# Patient Record
Sex: Male | Born: 1950
Health system: Southern US, Community
[De-identification: ages and names within clinical notes are randomized; demographics above are authoritative.]

## PROBLEM LIST (undated history)

## (undated) DIAGNOSIS — D61818 Other pancytopenia: Secondary | ICD-10-CM

## (undated) DIAGNOSIS — D472 Monoclonal gammopathy: Secondary | ICD-10-CM

## (undated) DIAGNOSIS — J189 Pneumonia, unspecified organism: Secondary | ICD-10-CM

## (undated) DIAGNOSIS — J670 Farmer's lung: Secondary | ICD-10-CM

## (undated) DIAGNOSIS — Z86010 Personal history of colonic polyps: Secondary | ICD-10-CM

## (undated) DIAGNOSIS — K648 Other hemorrhoids: Secondary | ICD-10-CM

## (undated) DIAGNOSIS — K219 Gastro-esophageal reflux disease without esophagitis: Secondary | ICD-10-CM

## (undated) DIAGNOSIS — I639 Cerebral infarction, unspecified: Secondary | ICD-10-CM

## (undated) DIAGNOSIS — M4802 Spinal stenosis, cervical region: Secondary | ICD-10-CM

## (undated) DIAGNOSIS — D649 Anemia, unspecified: Secondary | ICD-10-CM

## (undated) DIAGNOSIS — B9681 Helicobacter pylori [H. pylori] as the cause of diseases classified elsewhere: Secondary | ICD-10-CM

## (undated) DIAGNOSIS — I10 Essential (primary) hypertension: Secondary | ICD-10-CM

## (undated) DIAGNOSIS — K760 Fatty (change of) liver, not elsewhere classified: Secondary | ICD-10-CM

## (undated) DIAGNOSIS — K2970 Gastritis, unspecified, without bleeding: Secondary | ICD-10-CM

## (undated) DIAGNOSIS — D509 Iron deficiency anemia, unspecified: Secondary | ICD-10-CM

## (undated) DIAGNOSIS — E785 Hyperlipidemia, unspecified: Secondary | ICD-10-CM

## (undated) DIAGNOSIS — H269 Unspecified cataract: Secondary | ICD-10-CM

## (undated) HISTORY — DX: Iron deficiency anemia, unspecified: D50.9

## (undated) HISTORY — PX: CATARACT EXTRACTION: SUR2

## (undated) HISTORY — DX: Farmer's lung: J67.0

## (undated) HISTORY — DX: Personal history of colonic polyps: Z86.010

## (undated) HISTORY — DX: Essential (primary) hypertension: I10

## (undated) HISTORY — DX: Other hemorrhoids: K64.8

## (undated) HISTORY — DX: Unspecified cataract: H26.9

## (undated) HISTORY — DX: Spinal stenosis, cervical region: M48.02

## (undated) HISTORY — DX: Fatty (change of) liver, not elsewhere classified: K76.0

## (undated) HISTORY — DX: Gastritis, unspecified, without bleeding: K29.70

## (undated) HISTORY — DX: Pneumonia, unspecified organism: J18.9

## (undated) HISTORY — PX: ESOPHAGOGASTRODUODENOSCOPY: SHX1529

## (undated) HISTORY — DX: Anemia, unspecified: D64.9

## (undated) HISTORY — PX: TIBIA FRACTURE SURGERY: SHX806

## (undated) HISTORY — DX: Hyperlipidemia, unspecified: E78.5

## (undated) HISTORY — DX: Other pancytopenia: D61.818

## (undated) HISTORY — PX: COLONOSCOPY: SHX174

## (undated) HISTORY — DX: Monoclonal gammopathy: D47.2

## (undated) HISTORY — DX: Cerebral infarction, unspecified: I63.9

## (undated) HISTORY — DX: Gastro-esophageal reflux disease without esophagitis: K21.9

## (undated) HISTORY — DX: Helicobacter pylori (H. pylori) as the cause of diseases classified elsewhere: B96.81

---

## 1970-04-21 DIAGNOSIS — Z8701 Personal history of pneumonia (recurrent): Secondary | ICD-10-CM | POA: Insufficient documentation

## 1988-04-21 DIAGNOSIS — E7800 Pure hypercholesterolemia, unspecified: Secondary | ICD-10-CM | POA: Insufficient documentation

## 1988-04-21 DIAGNOSIS — E113299 Type 2 diabetes mellitus with mild nonproliferative diabetic retinopathy without macular edema, unspecified eye: Secondary | ICD-10-CM | POA: Insufficient documentation

## 1988-04-21 DIAGNOSIS — E119 Type 2 diabetes mellitus without complications: Secondary | ICD-10-CM | POA: Insufficient documentation

## 1988-04-21 HISTORY — DX: Type 2 diabetes mellitus without complications: E11.9

## 1988-04-21 HISTORY — DX: Pure hypercholesterolemia, unspecified: E78.00

## 1988-05-22 HISTORY — PX: FLEXIBLE SIGMOIDOSCOPY: SHX1649

## 1988-06-09 LAB — HM SIGMOIDOSCOPY: HM Sigmoidoscopy: NORMAL

## 1989-01-19 DIAGNOSIS — K219 Gastro-esophageal reflux disease without esophagitis: Secondary | ICD-10-CM

## 1989-01-19 HISTORY — DX: Gastro-esophageal reflux disease without esophagitis: K21.9

## 1996-10-07 ENCOUNTER — Encounter: Payer: Self-pay | Admitting: Family Medicine

## 1996-10-07 LAB — CONVERTED CEMR LAB: PSA: 0.5 ng/mL

## 1996-10-10 ENCOUNTER — Encounter: Payer: Self-pay | Admitting: Family Medicine

## 1997-10-10 ENCOUNTER — Encounter: Payer: Self-pay | Admitting: Family Medicine

## 1997-10-10 LAB — CONVERTED CEMR LAB: Hgb A1c MFr Bld: 7.9 %

## 1998-02-28 ENCOUNTER — Encounter: Payer: Self-pay | Admitting: Family Medicine

## 1998-08-23 ENCOUNTER — Encounter: Payer: Self-pay | Admitting: Family Medicine

## 1998-08-24 ENCOUNTER — Encounter: Payer: Self-pay | Admitting: Family Medicine

## 1998-08-24 LAB — CONVERTED CEMR LAB: Microalbumin U total vol: 15.3 mg/L

## 1999-11-20 DIAGNOSIS — I10 Essential (primary) hypertension: Secondary | ICD-10-CM

## 1999-11-20 HISTORY — DX: Essential (primary) hypertension: I10

## 1999-11-28 ENCOUNTER — Encounter: Payer: Self-pay | Admitting: Family Medicine

## 1999-11-28 LAB — CONVERTED CEMR LAB: Hgb A1c MFr Bld: 10.7 %

## 2001-10-31 ENCOUNTER — Encounter: Payer: Self-pay | Admitting: Family Medicine

## 2001-10-31 LAB — CONVERTED CEMR LAB: Microalbumin U total vol: 11.7 mg/L

## 2001-11-16 ENCOUNTER — Encounter: Payer: Self-pay | Admitting: Family Medicine

## 2001-11-16 LAB — CONVERTED CEMR LAB: PSA: 0.3 ng/mL

## 2001-11-17 ENCOUNTER — Encounter: Payer: Self-pay | Admitting: Family Medicine

## 2002-12-21 HISTORY — PX: KNEE SURGERY: SHX244

## 2003-06-27 ENCOUNTER — Encounter: Payer: Self-pay | Admitting: Family Medicine

## 2003-07-04 ENCOUNTER — Encounter: Payer: Self-pay | Admitting: Family Medicine

## 2003-07-04 LAB — CONVERTED CEMR LAB: Microalbumin U total vol: 41.8 mg/L

## 2004-09-04 ENCOUNTER — Ambulatory Visit: Payer: Self-pay | Admitting: Family Medicine

## 2004-09-12 ENCOUNTER — Ambulatory Visit: Payer: Self-pay | Admitting: Family Medicine

## 2004-09-17 ENCOUNTER — Ambulatory Visit: Payer: Self-pay | Admitting: Family Medicine

## 2005-01-20 ENCOUNTER — Ambulatory Visit: Payer: Self-pay | Admitting: Family Medicine

## 2005-01-20 LAB — CONVERTED CEMR LAB
Hgb A1c MFr Bld: 8.4 %
Microalbumin U total vol: 20.8 mg/L

## 2005-01-22 ENCOUNTER — Ambulatory Visit: Payer: Self-pay | Admitting: Family Medicine

## 2005-03-21 DIAGNOSIS — E11319 Type 2 diabetes mellitus with unspecified diabetic retinopathy without macular edema: Secondary | ICD-10-CM

## 2005-03-21 HISTORY — DX: Type 2 diabetes mellitus with unspecified diabetic retinopathy without macular edema: E11.319

## 2005-05-29 ENCOUNTER — Ambulatory Visit: Payer: Self-pay | Admitting: Family Medicine

## 2005-05-29 LAB — CONVERTED CEMR LAB
Hgb A1c MFr Bld: 6.2 %
Microalbumin U total vol: 13.8 mg/L

## 2005-06-03 ENCOUNTER — Ambulatory Visit: Payer: Self-pay | Admitting: Family Medicine

## 2006-05-18 ENCOUNTER — Ambulatory Visit: Payer: Self-pay | Admitting: Family Medicine

## 2006-05-18 LAB — CONVERTED CEMR LAB
AST: 20 units/L (ref 0–37)
BUN: 12 mg/dL (ref 6–23)
Chloride: 105 meq/L (ref 96–112)
Cholesterol: 154 mg/dL (ref 0–200)
Creatinine, Ser: 0.8 mg/dL (ref 0.4–1.5)
Creatinine,U: 138.7 mg/dL
GFR calc Af Amer: 128 mL/min
GFR calc non Af Amer: 106 mL/min
HDL: 50.2 mg/dL (ref >39.0)
Microalb Creat Ratio: 17.3 mg/g (ref 0.0–30.0)
Microalb, Ur: 2.4 mg/dL — ABNORMAL HIGH (ref 0.0–1.9)
PSA: 0.42 ng/mL
TSH: 1.98 microintl units/mL (ref 0.35–5.50)
Triglycerides: 206 mg/dL (ref 0–149)

## 2006-05-20 ENCOUNTER — Ambulatory Visit: Payer: Self-pay | Admitting: Family Medicine

## 2006-07-31 ENCOUNTER — Encounter: Payer: Self-pay | Admitting: Family Medicine

## 2006-08-17 ENCOUNTER — Ambulatory Visit: Payer: Self-pay | Admitting: Family Medicine

## 2006-08-19 ENCOUNTER — Ambulatory Visit: Payer: Self-pay | Admitting: Family Medicine

## 2007-08-24 ENCOUNTER — Ambulatory Visit: Payer: Self-pay | Admitting: Family Medicine

## 2007-08-24 LAB — CONVERTED CEMR LAB
ALT: 15 units/L (ref 0–53)
AST: 21 units/L (ref 0–37)
Alkaline Phosphatase: 56 units/L (ref 39–117)
Bilirubin, Direct: 0.1 mg/dL (ref 0.0–0.3)
CO2: 31 meq/L (ref 19–32)
Calcium: 9.3 mg/dL (ref 8.4–10.5)
Glucose, Bld: 154 mg/dL — ABNORMAL HIGH (ref 70–99)
PSA: 0.43 ng/mL (ref 0.10–4.00)
Potassium: 4.4 meq/L (ref 3.5–5.1)
Sodium: 142 meq/L (ref 135–145)
TSH: 2.16 microintl units/mL (ref 0.35–5.50)
Total Protein: 6.4 g/dL (ref 6.0–8.3)
Triglycerides: 206 mg/dL (ref 0–149)

## 2007-09-06 ENCOUNTER — Ambulatory Visit: Payer: Self-pay | Admitting: Family Medicine

## 2008-02-28 ENCOUNTER — Ambulatory Visit: Payer: Self-pay | Admitting: Family Medicine

## 2008-03-02 ENCOUNTER — Ambulatory Visit: Payer: Self-pay | Admitting: Family Medicine

## 2008-03-02 LAB — CONVERTED CEMR LAB
ALT: 18 units/L (ref 0–53)
AST: 19 units/L (ref 0–37)
Albumin: 4 g/dL (ref 3.5–5.2)
Cholesterol: 165 mg/dL (ref 0–200)
HDL: 46.4 mg/dL (ref >39.0)
Hgb A1c MFr Bld: 7.7 % — ABNORMAL HIGH (ref 4.6–6.0)
Triglycerides: 180 mg/dL — ABNORMAL HIGH (ref 0–149)

## 2008-08-22 ENCOUNTER — Ambulatory Visit: Payer: Self-pay | Admitting: Family Medicine

## 2008-08-22 LAB — CONVERTED CEMR LAB
Alkaline Phosphatase: 74 units/L (ref 39–117)
BUN: 16 mg/dL (ref 6–23)
Basophils Relative: 0.1 % (ref 0.0–3.0)
Bilirubin, Direct: 0.1 mg/dL (ref 0.0–0.3)
CO2: 30 meq/L (ref 19–32)
Calcium: 9.3 mg/dL (ref 8.4–10.5)
Creatinine, Ser: 0.9 mg/dL (ref 0.4–1.5)
Creatinine,U: 108.8 mg/dL
Eosinophils Absolute: 0.1 10*3/uL (ref 0.0–0.7)
Hgb A1c MFr Bld: 7.5 % — ABNORMAL HIGH (ref 4.6–6.5)
Lymphocytes Relative: 29.2 % (ref 12.0–46.0)
MCHC: 35.7 g/dL (ref 30.0–36.0)
Microalb Creat Ratio: 48.7 mg/g — ABNORMAL HIGH (ref 0.0–30.0)
Microalb, Ur: 5.3 mg/dL — ABNORMAL HIGH (ref 0.0–1.9)
Neutrophils Relative %: 58.7 % (ref 43.0–77.0)
PSA: 0.55 ng/mL (ref 0.10–4.00)
Platelets: 120 10*3/uL — ABNORMAL LOW (ref 150.0–400.0)
RBC: 4.28 M/uL (ref 4.22–5.81)
Total Bilirubin: 1.2 mg/dL (ref 0.3–1.2)
Total Protein: 6.8 g/dL (ref 6.0–8.3)
WBC: 4.7 10*3/uL (ref 4.5–10.5)

## 2009-02-19 ENCOUNTER — Ambulatory Visit: Payer: Self-pay | Admitting: Family Medicine

## 2009-02-19 LAB — CONVERTED CEMR LAB: Hgb A1c MFr Bld: 6.5 % (ref 4.6–6.5)

## 2009-02-21 ENCOUNTER — Ambulatory Visit: Payer: Self-pay | Admitting: Family Medicine

## 2009-03-12 ENCOUNTER — Telehealth: Payer: Self-pay | Admitting: Family Medicine

## 2009-03-13 ENCOUNTER — Telehealth: Payer: Self-pay | Admitting: Family Medicine

## 2009-10-16 ENCOUNTER — Ambulatory Visit: Payer: Self-pay | Admitting: Family Medicine

## 2009-10-16 LAB — HM DIABETES FOOT EXAM

## 2009-10-17 LAB — CONVERTED CEMR LAB
ALT: 17 units/L (ref 0–53)
AST: 18 units/L (ref 0–37)
Albumin: 4.2 g/dL (ref 3.5–5.2)
Alkaline Phosphatase: 75 units/L (ref 39–117)
Bilirubin, Direct: 0.1 mg/dL (ref 0.0–0.3)
CO2: 31 meq/L (ref 19–32)
Chloride: 105 meq/L (ref 96–112)
Glucose, Bld: 149 mg/dL — ABNORMAL HIGH (ref 70–99)
HDL: 62.4 mg/dL (ref >39.00)
Microalb, Ur: 8.6 mg/dL — ABNORMAL HIGH (ref 0.0–1.9)
PSA: 0.54 ng/mL (ref 0.10–4.00)
Potassium: 4.6 meq/L (ref 3.5–5.1)
Sodium: 141 meq/L (ref 135–145)
Total CHOL/HDL Ratio: 3
Total Protein: 6.8 g/dL (ref 6.0–8.3)

## 2009-11-06 ENCOUNTER — Encounter: Payer: Self-pay | Admitting: Family Medicine

## 2009-11-06 LAB — HM DIABETES EYE EXAM

## 2009-11-27 ENCOUNTER — Encounter (INDEPENDENT_AMBULATORY_CARE_PROVIDER_SITE_OTHER): Payer: Self-pay | Admitting: *Deleted

## 2009-12-19 ENCOUNTER — Encounter: Payer: Self-pay | Admitting: Family Medicine

## 2010-01-08 ENCOUNTER — Ambulatory Visit: Payer: Self-pay | Admitting: Family Medicine

## 2010-01-08 DIAGNOSIS — M79609 Pain in unspecified limb: Secondary | ICD-10-CM | POA: Insufficient documentation

## 2010-02-26 ENCOUNTER — Telehealth: Payer: Self-pay | Admitting: Family Medicine

## 2010-05-22 NOTE — Letter (Signed)
Summary: Nadara Eaton letter  [facility] at [facility]  [address] [place], [place] [address]   Phone: [phone]  Fax: [phone]       11/27/2009 MRN: [id number]  Harbor Rybarczyk [address] [place], [place]  [address]  Dear Mr. Rolm Baptise,  [facility] [place], and [facility] announce the retirement of Arta Silence, M.D., from full-time practice at the [facility] office effective October 18, 2009 and his plans of returning part-time.  It is important to Dr. Hetty Ely and to our practice that you understand that [facility] has seven physicians in our office for your health care needs.  We will continue to offer the same exceptional care that you have today.    Dr. Hetty Ely has spoken to many of you about his plans for retirement and returning part-time in the fall.   We will continue to work with you through the transition to schedule appointments for you in the office and meet the high standards that [facility] is committed to.   Again, it is with great pleasure that we share the news that Dr. Hetty Ely will return to [facility] at [facility] in October of 2011 with a reduced schedule.    If you have any questions, or would like to request an appointment with one of our physicians, please call us at [phone] and press the option for Scheduling an appointment.  We take pleasure in providing you with excellent patient care and look forward to seeing you at your next office visit.  Our [facility] Physicians are:  Tillman Abide, M.D. Laurita Quint, M.D. Roxy Manns, M.D. Kerby Nora, M.D. Hannah Beat, M.D. Ruthe Mannan, M.D. We proudly welcomed Raechel Ache, M.D. and Eustaquio Boyden, M.D. to the practice in July/August 2011.  Sincerely,  [place] [facility] [facility]

## 2010-05-22 NOTE — Miscellaneous (Signed)
  Clinical Lists Changes  Observations: Added new observation of DIAB EYE EX: Dr. Eustaquio Maize- L eye with mild diabetic macular changes.  R 20/25 and L 20/50 (11/06/2009 20:53)      Diabetic Eye Exam  Procedure date:  11/06/2009  Findings:      Dr. Eustaquio Maize- L eye with mild diabetic macular changes.  R 20/25 and L 20/50

## 2010-05-22 NOTE — Assessment & Plan Note (Signed)
Summary: CPX/RBH   Vital Signs:  Patient profile:   60 year old male Height:      74 inches Weight:      272.75 pounds BMI:     35.15 Temp:     98.3 degrees F oral Pulse rate:   76 / minute Pulse rhythm:   regular BP sitting:   138 / 70  (left arm) Cuff size:   large  Vitals Entered By: Sydell Axon LPN (October 16, 2009 3:01 PM) CC: 30 Minute checkup, hemoccult cards given to patient   History of Present Illness: Pt here for Comp Exam. He feels well except for old age probs. He currently has a cold. He works with the [employer] on the [place] [employer]. It began just before last week and the [place] felt they were infective, all wearing masks.  He is trying to be good with diet and exercise. He gains in the winter and has begun losing weight now, in the summer. .  He has some plantar fascuiitis and is seeing the eye doctor next week. He has had injections in the eye for presumably macular degen which have done nothing so those have been stopped.  Preventive Screening-Counseling & Management  Alcohol-Tobacco     Alcohol drinks/day: 0     Alcohol type: wine rarely     Smoking Status: never     Passive Smoke Exposure: no  Caffeine-Diet-Exercise     Caffeine use/day: 4     Does Patient Exercise: yes     Type of exercise: elliptical     Exercise (avg: min/session): 39mins-hr     Times/week: 3  Problems Prior to Update: 1)  Health Maintenance Exam  (ICD-V70.0) 2)  Special Screening Malignant Neoplasm of Prostate  (ICD-V76.44) 3)  Hx, Pneumonia  (ICD-V12.61) 4)  Gilbert's Syndrome, 6/99  (ICD-277.4) 5)  Diabetic Retinopathy 12/06  (ICD-250.50) 6)  Hypercholesterolemia (353,TRIG1980) 1990  (ICD-272.0) 7)  Diabetes Mellitus, Type II  (ICD-250.00) 8)  Hypertension  (ICD-401.9) 9)  Gerd  (ICD-530.81)  Medications Prior to Update: 1)  Glyburide 5 Mg Tabs (Glyburide) .... Take 1 Tablet By Mouth Twice A Day 2)  Lipitor 10 Mg Tabs (Atorvastatin Calcium) .Marland Kitchen.. 1 Tablet At Bedtime By Mouth 3)   Metformin Hcl 850 Mg Tabs (Metformin Hcl) .... Take 1 Tablet By Mouth Three Times A Day 4)  Altace 10 Mg Caps (Ramipril) .Marland Kitchen.. 1 Tablet Daily By Mouth 5)  Actos 45 Mg Tabs (Pioglitazone Hcl) .... One Tab By Mouth Once A Day. 6)  Niaspan 500 Mg Tbcr (Niacin (Antihyperlipidemic)) .... 3 Tablet At Bedtime By Mouth 7)  Aspirin 81 Mg Tbec (Aspirin) .... Take One By Mouth Daily  Allergies: 1)  ! * Phenergan  Past History:  Past Medical History: Last updated: 07/31/2006 GERD, 10/90 Hypertension, 8/01 Diabetes mellitus, type II, 84/90  Family History: Last updated: 10/16/2009 Father dec 76 Emphysema Ca (unknown) Mother dec 78 Ca (unknown) Brother  A 22   Skin CA Brother  A 59    LBP Retired   Sheriff--4 ruptured discs          ( 6/09) Sister A 57 Unknown            Social History: Last updated: 09/06/2007 Occupation: Product manager Sports Married  one 22yoa son (09) Never Smoked Alcohol use-yes Drug use-no Regular exercise-no  Risk Factors: Alcohol Use: 0 (10/16/2009) Caffeine Use: 4 (10/16/2009) Exercise: yes (10/16/2009)  Risk Factors: Smoking Status: never (10/16/2009) Passive Smoke Exposure: no (10/16/2009)  Past Surgical History: fractured arm as child Flex. sig., 60 cm.- int. hemorrhoid 2/90 L knee - fx. repair ORIF 9/04  Family History: Father dec 76 Emphysema Ca (unknown) Mother dec 78 Ca (unknown) Brother  A 63   Skin CA Brother  A 59    LBP Retired   Sheriff--4 ruptured discs          ( 6/09) Sister A 57 Unknown            Review of Systems General:  Denies chills, fatigue, fever, sweats, weakness, and weight loss. Eyes:  Complains of blurring; denies discharge and eye pain; macular degen. ENT:  Complains of decreased hearing and ringing in ears; denies earache; Hearing decreased in right, tinnitus in left.. CV:  Denies chest pain or discomfort, fainting, fatigue, palpitations, shortness of breath with exertion, swelling of feet, and swelling of  hands. Resp:  Denies cough, shortness of breath, and wheezing. GI:  Complains of indigestion; denies abdominal pain, bloody stools, change in bowel habits, constipation, dark tarry stools, diarrhea, loss of appetite, nausea, vomiting, vomiting blood, and yellowish skin color; rarely.. GU:  Denies dysuria, incontinence, nocturia, and urinary frequency. MS:  Complains of joint pain and low back pain; denies muscle aches, cramps, and stiffness; left knee and left hip lower back chronically from exertion.. Derm:  Denies dryness, itching, and rash. Neuro:  Denies numbness, poor balance, tingling, and tremors.  Physical Exam  General:  Well-developed,well-nourished,in no acute distress; alert,appropriate and cooperative throughout examination, another 7  pounds lighter. Head:  Normocephalic and atraumatic without obvious abnormalities. No apparent alopecia or balding. Sinuses NT. Eyes:  Conjunctiva clear bilaterally.  Ears:  External ear exam shows no significant lesions or deformities.  Otoscopic examination reveals clear canals, tympanic membranes are intact bilaterally without bulging, retraction, inflammation or discharge. Hearing is grossly normal bilaterally. Nose:  External nasal examination shows no deformity or inflammation. Nasal mucosa are pink and moist without lesions or exudates. Mouth:  Oral mucosa and oropharynx without lesions or exudates.  Teeth in good repair. Neck:  No deformities, masses, or tenderness noted. Chest Wall:  No deformities, masses, tenderness or gynecomastia noted. Breasts:  No masses or gynecomastia noted Lungs:  Normal respiratory effort, chest expands symmetrically. Lungs are clear to auscultation, no crackles or wheezes. Heart:  Normal rate and regular rhythm. S1 and S2 normal without gallop, murmur, click, rub or other extra sounds. Abdomen:  Bowel sounds positive,abdomen soft and non-tender without masses, organomegaly or hernias noted. Rectal:  No external  abnormalities noted. Normal sphincter tone. No rectal masses or tenderness. G neg. Min ext hemms. Genitalia:  Testes bilaterally descended without nodularity, tenderness or masses. No scrotal masses or lesions. No penis lesions or urethral discharge. Prostate:  Prostate gland firm and smooth, no enlargement, nodularity, tenderness, mass, asymmetry or induration. 10-20gms. Msk:  No deformity or scoliosis noted of thoracic or lumbar spine.   Pulses:  R and L carotid,radial,femoral,dorsalis pedis and posterior tibial pulses are full and equal bilaterally Extremities:  No clubbing, cyanosis, edema, or deformity noted with normal full range of motion of all joints.  Mild echymosis of lower legs bilat. Superficial 2cm skin avulsion scar  left lateral lower extremity. Neurologic:  No cranial nerve deficits noted. Station and gait are normal. Plantar reflexes are down-going bilaterally. DTRs are symmetrical throughout. Sensory, motor and coordinative functions appear intact. Skin:  Intact without suspicious lesions or rashes, mild sunburn as usual...again told to use sunscreen. See Extremities for lower leg report. Cervical  Nodes:  No lymphadenopathy noted Inguinal Nodes:  No significant adenopathy Psych:  Cognition and judgment appear intact. Alert and cooperative with normal attention span and concentration. No apparent delusions, illusions, hallucinations  Diabetes Management Exam:    Foot Exam (with socks and/or shoes not present):       Sensory-Pinprick/Light touch:          Left medial foot (L-4): normal          Left dorsal foot (L-5): normal          Left lateral foot (S-1): normal          Right medial foot (L-4): normal          Right dorsal foot (L-5): normal          Right lateral foot (S-1): normal       Sensory-Monofilament:          Left foot: normal          Right foot: normal       Inspection:          Left foot: normal          Right foot: normal       Nails:          Left  foot: normal          Right foot: normal   Impression & Recommendations:  Problem # 1:  HEALTH MAINTENANCE EXAM (ICD-V70.0)  Reviewed preventive care protocols, scheduled due services, and updated immunizations. Tdap today.  Problem # 2:  GILBERT'S SYNDROME,  6/99 (ICD-277.4) Assessment: Unchanged Will recheck. Labs pending.  Problem # 3:  HYPERCHOLESTEROLEMIA  (353,TRIG1980) 1990 (ICD-272.0) Labs pending. Will report via phone tree. His updated medication list for this problem includes:    Lipitor 10 Mg Tabs (Atorvastatin calcium) .Marland Kitchen... 1 tablet at bedtime by mouth    Niaspan 500 Mg Tbcr (Niacin (antihyperlipidemic)) .Marland KitchenMarland KitchenMarland KitchenMarland Kitchen 3 tablet at bedtime by mouth  Labs Reviewed: SGOT: 19 (08/22/2008)   SGPT: 13 (08/22/2008)   HDL:46.4 (03/02/2008), 45.9 (08/24/2007)  LDL:83 (03/02/2008), DEL (16/01/9603)  Chol:165 (03/02/2008), 165 (08/24/2007)  Trig:180 (03/02/2008), 206 (08/24/2007)  Problem # 4:  DIABETES MELLITUS, TYPE II (ICD-250.00) Assessment: Unchanged Continuing to lose weight. Will again report vis phone tree. His updated medication list for this problem includes:    Glyburide 5 Mg Tabs (Glyburide) .Marland Kitchen... Take 1 tablet by mouth twice a day    Metformin Hcl 850 Mg Tabs (Metformin hcl) .Marland Kitchen... Take 1 tablet by mouth three times a day    Altace 10 Mg Caps (Ramipril) .Marland Kitchen... 1 tablet daily by mouth    Actos 45 Mg Tabs (Pioglitazone hcl) ..... One tab by mouth once a day.    Aspirin 81 Mg Tbec (Aspirin) .Marland Kitchen... Take one by mouth daily  Labs Reviewed: Creat: 0.9 (08/22/2008)   Microalbumin: 17.3 (05/18/2006)  Last Eye Exam: normal (08/30/2008) Reviewed HgBA1c results: 6.5 (02/19/2009)  7.5 (08/22/2008)  Problem # 5:  HYPERTENSION (ICD-401.9) Assessment: Unchanged Stable. His updated medication list for this problem includes:    Altace 10 Mg Caps (Ramipril) .Marland Kitchen... 1 tablet daily by mouth  BP today: 138/70 Prior BP: 136/68 (02/21/2009)  Labs Reviewed: K+: 4.2 (08/22/2008) Creat: :  0.9 (08/22/2008)   Chol: 165 (03/02/2008)   HDL: 46.4 (03/02/2008)   LDL: 83 (03/02/2008)   TG: 180 (03/02/2008)  Problem # 6:  GERD (ICD-530.81) Assessment: Unchanged  Self limited.  Diagnostics Reviewed:  Discussed lifestyle modifications, diet, antacids/medications, and preventive measures. Handout provided.  Complete Medication List: 1)  Glyburide 5 Mg Tabs (Glyburide) .... Take 1 tablet by mouth twice a day 2)  Lipitor 10 Mg Tabs (Atorvastatin calcium) .Marland Kitchen.. 1 tablet at bedtime by mouth 3)  Metformin Hcl 850 Mg Tabs (Metformin hcl) .... Take 1 tablet by mouth three times a day 4)  Altace 10 Mg Caps (Ramipril) .Marland Kitchen.. 1 tablet daily by mouth 5)  Actos 45 Mg Tabs (Pioglitazone hcl) .... One tab by mouth once a day. 6)  Niaspan 500 Mg Tbcr (Niacin (antihyperlipidemic)) .... 3 tablet at bedtime by mouth 7)  Aspirin 81 Mg Tbec (Aspirin) .... Take one by mouth daily  Patient Instructions: 1)  RTC as needed or six months. 2)  Report labs via phone tree.  Current Allergies (reviewed today): ! * PHENERGAN  Appended Document: CPX/RBH   Tetanus/Td Vaccine    Vaccine Type: Tdap    Site: left deltoid    Mfr: GlaxoSmithKline    Dose: 0.5 ml    Route: IM    Given by: Sydell Axon LPN    Exp. Date: 07/13/2011    Lot #: [id number]    VIS given: 03/09/07 version given October 16, 2009.

## 2010-05-22 NOTE — Progress Notes (Signed)
Summary: rx for mail order   Phone Note Refill Request Message from:  Patient on February 26, 2010 11:48 AM  Refills Requested: Medication #1:  METFORMIN HCL 850 MG TABS Take 1 tablet by mouth three times a day  Medication #2:  LIPITOR 10 MG TABS 1 TABLET AT BEDTIME BY MOUTH  Medication #3:  ALTACE 10 MG CAPS 1 TABLET DAILY BY MOUTH  Medication #4:  NIASPAN 500 MG TBCR 3 TABLET AT BEDTIME BY MOUTH [facility]   Initial call taken by: Melody Comas,  February 26, 2010 11:49 AM    Prescriptions: NIASPAN 500 MG TBCR (NIACIN (ANTIHYPERLIPIDEMIC)) 3 TABLET AT BEDTIME BY MOUTH  #270 x 3   Entered by:   Lowella Petties CMA, AAMA   Authorized by:   Shaune Leeks MD   Signed by:   Lowella Petties CMA, AAMA on 02/26/2010   Method used:   Electronically to        [facility] ([facility]order)       [facility]       [address]       [place], [place]  [address]       Ph: [phone]       Fax: [phone]   RxID:   [id number] ALTACE 10 MG CAPS (RAMIPRIL) 1 TABLET DAILY BY MOUTH  #90 x 3   Entered by:   Lowella Petties CMA, AAMA   Authorized by:   Shaune Leeks MD   Signed by:   Lowella Petties CMA, AAMA on 02/26/2010   Method used:   Electronically to        [facility] (mail-order)       [facility]       [address]       [place], [place]  [address]       Ph: [phone]       Fax: [phone]   RxID:   [id number] METFORMIN HCL 850 MG TABS (METFORMIN HCL) Take 1 tablet by mouth three times a day  #270 x 3   Entered by:   Lowella Petties CMA, AAMA   Authorized by:   Shaune Leeks MD   Signed by:   Lowella Petties CMA, AAMA on 02/26/2010   Method used:   Electronically to        [facility] (mail-order)       [facility]       [address]       [place], [place]  [address]       Ph: [phone]       Fax: [phone]   RxID:    [id number] LIPITOR 10 MG TABS (ATORVASTATIN CALCIUM) 1 TABLET AT BEDTIME BY MOUTH  #90 x 3   Entered by:   Lowella Petties CMA, AAMA   Authorized by:   Shaune Leeks MD   Signed by:   Lowella Petties CMA, AAMA on 02/26/2010   Method used:   Electronically to        [facility] (mail-order)       [facility]       [address]       [place], [place]  [address]       Ph: [phone]       Fax: [phone]   RxID:   [id number]

## 2010-05-22 NOTE — Letter (Signed)
Summary: [facility]   Imported By: Sherian Rein 12/27/2009 10:58:28  _____________________________________________________________________  External Attachment:    Type:   Image     Comment:   External Document

## 2010-05-22 NOTE — Assessment & Plan Note (Signed)
Summary: SWOLLEN FOOT AND TOE/JRR   Vital Signs:  Patient profile:   60 year old male Height:      74 inches Weight:      288.25 pounds BMI:     37.14 Temp:     98.2 degrees F oral Pulse rate:   76 / minute Pulse rhythm:   regular BP sitting:   148 / 72  (left arm) Cuff size:   large  Vitals Entered By: Delilah Shan CMA Duncan Dull) (January 08, 2010 11:23 AM) CC: Swollen foot and toe   History of Present Illness: R 3rd toe swollen and tender. Started months ago. No relief with stretching.  Less active last month.  Staying about the same generally, some decrease in edema from last week.  More pain with walking.  Less pain after walking on it.  H/o pronation with gait.  Case fell on R toes about 2.5 weeks ago, but symptoms predate that.   Allergies: 1)  ! * Phenergan  Social History: Occupation: Education officer, museum Married  one  Never Smoked Alcohol use-yes Drug use-no Regular exercise-no  Review of Systems       See HPI.  Otherwise negative.    Physical Exam  General:  NAD No ankle edema.  bilateral DP pulses 2+ R foot with dropped MT heads and loss of long/transv arch.  R 3rd toe puffy but w/o erthema   Impression & Recommendations:  Problem # 1:  FOOT PAIN, RIGHT (ICD-729.5) Likely combination of loss of arch, dropped MT heads, and plantar fasciitis.  Rec arch supports and MT pad if not improved.  Call back as needed.  Keep stretching.    Complete Medication List: 1)  Glyburide 5 Mg Tabs (Glyburide) .... Take 1 tablet by mouth twice a day 2)  Lipitor 10 Mg Tabs (Atorvastatin calcium) .Marland Kitchen.. 1 tablet at bedtime by mouth 3)  Metformin Hcl 850 Mg Tabs (Metformin hcl) .... Take 1 tablet by mouth three times a day 4)  Altace 10 Mg Caps (Ramipril) .Marland Kitchen.. 1 tablet daily by mouth 5)  Actos 45 Mg Tabs (Pioglitazone hcl) .... One tab by mouth once a day. 6)  Niaspan 500 Mg Tbcr (Niacin (antihyperlipidemic)) .... 3 tablet at bedtime by mouth 7)  Aspirin 81 Mg Tbec (Aspirin)  .... Take one by mouth daily 8)  Multivitamins Tabs (Multiple vitamin) .... Take 1 tablet by mouth once a day  Patient Instructions: 1)  I would get soft inserts that will support your arch.  Use them for both feet and see if that helps.  If you continue to have pain, get a metatarsal pad and put it in your right shoe.  You would put it behind the tender spot (closer to your heel).  That should help after a few days but it may be tender to walk on it initially.  Take care.  Glad to see you. Keep stretching.   Current Allergies (reviewed today): ! * PHENERGAN

## 2010-08-28 ENCOUNTER — Inpatient Hospital Stay: Payer: Self-pay | Admitting: Specialist

## 2010-08-28 DIAGNOSIS — Z01810 Encounter for preprocedural cardiovascular examination: Secondary | ICD-10-CM

## 2010-10-17 ENCOUNTER — Other Ambulatory Visit: Payer: Self-pay | Admitting: Family Medicine

## 2010-10-17 DIAGNOSIS — E119 Type 2 diabetes mellitus without complications: Secondary | ICD-10-CM

## 2010-10-22 ENCOUNTER — Encounter: Payer: Self-pay | Admitting: Family Medicine

## 2010-10-22 ENCOUNTER — Other Ambulatory Visit (INDEPENDENT_AMBULATORY_CARE_PROVIDER_SITE_OTHER): Payer: PRIVATE HEALTH INSURANCE | Admitting: Family Medicine

## 2010-10-22 DIAGNOSIS — E119 Type 2 diabetes mellitus without complications: Secondary | ICD-10-CM

## 2010-10-22 LAB — LIPID PANEL
Cholesterol: 146 mg/dL (ref 0–200)
LDL Cholesterol: 58 mg/dL (ref 0–99)
VLDL: 24 mg/dL (ref 0.0–40.0)

## 2010-10-22 LAB — COMPREHENSIVE METABOLIC PANEL
ALT: 12 U/L (ref 0–53)
AST: 14 U/L (ref 0–37)
Albumin: 4 g/dL (ref 3.5–5.2)
CO2: 31 mEq/L (ref 19–32)
GFR: 93.18 mL/min (ref >60.00)
Glucose, Bld: 137 mg/dL — ABNORMAL HIGH (ref 70–99)
Potassium: 4.8 mEq/L (ref 3.5–5.1)
Sodium: 143 mEq/L (ref 135–145)
Total Bilirubin: 0.4 mg/dL (ref 0.3–1.2)
Total Protein: 6.4 g/dL (ref 6.0–8.3)

## 2010-10-24 ENCOUNTER — Ambulatory Visit (INDEPENDENT_AMBULATORY_CARE_PROVIDER_SITE_OTHER): Payer: PRIVATE HEALTH INSURANCE | Admitting: Family Medicine

## 2010-10-24 ENCOUNTER — Encounter: Payer: Self-pay | Admitting: Family Medicine

## 2010-10-24 VITALS — BP 138/64 | HR 72 | Temp 98.5°F | Ht 74.0 in | Wt 277.8 lb

## 2010-10-24 DIAGNOSIS — E7800 Pure hypercholesterolemia, unspecified: Secondary | ICD-10-CM

## 2010-10-24 DIAGNOSIS — S82209A Unspecified fracture of shaft of unspecified tibia, initial encounter for closed fracture: Secondary | ICD-10-CM

## 2010-10-24 DIAGNOSIS — I10 Essential (primary) hypertension: Secondary | ICD-10-CM

## 2010-10-24 DIAGNOSIS — Z1211 Encounter for screening for malignant neoplasm of colon: Secondary | ICD-10-CM

## 2010-10-24 DIAGNOSIS — M79609 Pain in unspecified limb: Secondary | ICD-10-CM

## 2010-10-24 DIAGNOSIS — D649 Anemia, unspecified: Secondary | ICD-10-CM

## 2010-10-24 DIAGNOSIS — Z125 Encounter for screening for malignant neoplasm of prostate: Secondary | ICD-10-CM

## 2010-10-24 DIAGNOSIS — Z0000 Encounter for general adult medical examination without abnormal findings: Secondary | ICD-10-CM

## 2010-10-24 DIAGNOSIS — E119 Type 2 diabetes mellitus without complications: Secondary | ICD-10-CM

## 2010-10-24 HISTORY — DX: Unspecified fracture of shaft of unspecified tibia, initial encounter for closed fracture: S82.209A

## 2010-10-24 LAB — IBC PANEL
Iron: 60 ug/dL (ref 42–165)
Saturation Ratios: 15.2 % — ABNORMAL LOW (ref 20.0–50.0)
Transferrin: 281.8 mg/dL (ref 212.0–360.0)

## 2010-10-24 LAB — CBC WITH DIFFERENTIAL/PLATELET
Eosinophils Relative: 3.7 % (ref 0.0–5.0)
Lymphocytes Relative: 32.6 % (ref 12.0–46.0)
Monocytes Absolute: 0.4 10*3/uL (ref 0.1–1.0)
Monocytes Relative: 8.7 % (ref 3.0–12.0)
Neutrophils Relative %: 54.5 % (ref 43.0–77.0)
Platelets: 139 10*3/uL — ABNORMAL LOW (ref 150.0–400.0)
WBC: 4.3 10*3/uL — ABNORMAL LOW (ref 4.5–10.5)

## 2010-10-24 NOTE — Assessment & Plan Note (Signed)
PSA options were discussed along with recent recs.  No need for psa at this point, since patient is low risk and there is no FH of prosate CA.  He declined testing of PSA.

## 2010-10-24 NOTE — Assessment & Plan Note (Signed)
Healthy habits encouraged.  Flu shot in fall, pt to check on zostavax.  tdap up to date.  He'll inc exercise in PT.

## 2010-10-24 NOTE — Patient Instructions (Addendum)
I would get a flu shot each fall.   Check with your insurance to see if they will cover the shingles shot. You can get your results through our phone system.  Follow the instructions on the blue card. Please call about follow up with the eye clinic.   I'll notify you about rechecking your labs in 3-6 months.

## 2010-10-24 NOTE — Assessment & Plan Note (Signed)
Controlled, labs d/w pt.  

## 2010-10-24 NOTE — Assessment & Plan Note (Signed)
Labs d/w pt, no change in meds.

## 2010-10-24 NOTE — Assessment & Plan Note (Signed)
Return for f/u A1c in 3 months.  See notes on labs.  No change in meds.

## 2010-10-24 NOTE — Assessment & Plan Note (Signed)
D/w patient re:options for colon cancer screening, including IFOB vs. colonoscopy.  Risks and benefits of both were discussed and patient voiced understanding.  Pt elects for:IFOB  

## 2010-10-24 NOTE — Progress Notes (Signed)
Foot pain per [facility].  Seen for R foot arch changes.    Seeing Dr. Hyacinth Meeker about L tibial fracture s/p repair.  He's working on PT now.  Using a walker and improving gradually.  Diabetes:  Using medications without difficulties:yes Hypoglycemic episodes:rare Hyperglycemic episodes:rare Feet problems:dec in sensation after L tibial rx Blood Sugars averaging: 80-140  H/o anemia- noted on labs at [facility].  Due for repeat today.  This may have influenced the A1c.    ? OSA during admission.  Since then, he is now back to sleeping on his side and not having AM fatigue/HA.  Down 10+ lbs.  We tabled the sleep study referral today.  If he has return of sx, then he'll notify us.   CPE- See plan.  Routine anticipatory guidance given to patient.  See health maintenance.  PMH and SH reviewed  Meds, vitals, and allergies reviewed.   ROS: See HPI.  Otherwise negative.    GEN: nad, alert and oriented HEENT: mucous membranes moist NECK: supple w/o LA CV: rrr. PULM: ctab, no inc wob ABD: soft, +bs EXT: no edema SKIN: no acute rash, L leg incision well healed.   Prostate gland firm and smooth, no enlargement, nodularity, tenderness, mass, asymmetry or induration.  Diabetic foot exam: Normal inspection No skin breakdown No calluses  Normal DP pulses Normal sensation to light touch and monofilament on R foot, dec on L foot.  Nails normal

## 2010-10-24 NOTE — Assessment & Plan Note (Signed)
Per ortho.  

## 2010-10-25 ENCOUNTER — Telehealth: Payer: Self-pay | Admitting: *Deleted

## 2010-10-25 NOTE — Telephone Encounter (Signed)
Pt callback msge from [facility]. Pt informed of Lab results & will call back at later time after checking schedule to schedule lab appt. For 3 mths out.

## 2010-11-04 ENCOUNTER — Other Ambulatory Visit: Payer: PRIVATE HEALTH INSURANCE

## 2010-11-05 ENCOUNTER — Other Ambulatory Visit: Payer: Self-pay | Admitting: Family Medicine

## 2010-11-05 DIAGNOSIS — Z1289 Encounter for screening for malignant neoplasm of other sites: Secondary | ICD-10-CM

## 2010-11-05 LAB — FECAL OCCULT BLOOD, IMMUNOCHEMICAL: Fecal Occult Bld: NEGATIVE

## 2010-11-06 ENCOUNTER — Encounter: Payer: Self-pay | Admitting: *Deleted

## 2010-11-28 ENCOUNTER — Other Ambulatory Visit: Payer: Self-pay | Admitting: *Deleted

## 2010-11-28 MED ORDER — FERROUS SULFATE 325 (65 FE) MG PO TABS: 325.0000 mg | ORAL_TABLET | Freq: Two times a day (BID) | ORAL | Status: DC

## 2010-11-28 NOTE — Telephone Encounter (Signed)
Yes, continue, recheck blood counts with other labs as scheduled.  Please set up lab visit and OV after the labs.  Thanks.

## 2010-11-28 NOTE — Telephone Encounter (Signed)
Pt states the refills on the iron that pt was prescribed by the surgeon have run out and pt's wife is asking if you want him to continue this.  If so, please send to [facility].

## 2010-11-29 NOTE — Telephone Encounter (Signed)
Dennis Wise advised as instructed via telephone.  She will call back to schedule labs and OV in about 3 months like Dr. Para March had discussed with them previously.

## 2011-01-10 ENCOUNTER — Other Ambulatory Visit: Payer: Self-pay | Admitting: Family Medicine

## 2011-02-25 ENCOUNTER — Other Ambulatory Visit (INDEPENDENT_AMBULATORY_CARE_PROVIDER_SITE_OTHER): Payer: BC Managed Care – PPO

## 2011-02-25 DIAGNOSIS — D649 Anemia, unspecified: Secondary | ICD-10-CM

## 2011-02-25 DIAGNOSIS — E119 Type 2 diabetes mellitus without complications: Secondary | ICD-10-CM

## 2011-02-25 LAB — CBC WITH DIFFERENTIAL/PLATELET
Basophils Absolute: 0 10*3/uL (ref 0.0–0.1)
Basophils Relative: 0.4 % (ref 0.0–3.0)
Eosinophils Absolute: 0.1 10*3/uL (ref 0.0–0.7)
HCT: 35.2 % — ABNORMAL LOW (ref 39.0–52.0)
Hemoglobin: 12.2 g/dL — ABNORMAL LOW (ref 13.0–17.0)
Lymphocytes Relative: 35.4 % (ref 12.0–46.0)
Lymphs Abs: 1.5 10*3/uL (ref 0.7–4.0)
MCHC: 34.8 g/dL (ref 30.0–36.0)
Neutro Abs: 2.1 10*3/uL (ref 1.4–7.7)
RBC: 4.1 Mil/uL — ABNORMAL LOW (ref 4.22–5.81)
RDW: 14.1 % (ref 11.5–14.6)

## 2011-02-25 LAB — IBC PANEL: Saturation Ratios: 24.7 % (ref 20.0–50.0)

## 2011-03-03 ENCOUNTER — Ambulatory Visit (INDEPENDENT_AMBULATORY_CARE_PROVIDER_SITE_OTHER): Payer: BC Managed Care – PPO | Admitting: Family Medicine

## 2011-03-03 ENCOUNTER — Encounter: Payer: Self-pay | Admitting: Family Medicine

## 2011-03-03 VITALS — BP 132/60 | HR 70 | Temp 98.1°F | Wt 282.0 lb

## 2011-03-03 DIAGNOSIS — D649 Anemia, unspecified: Secondary | ICD-10-CM

## 2011-03-03 DIAGNOSIS — E119 Type 2 diabetes mellitus without complications: Secondary | ICD-10-CM

## 2011-03-03 DIAGNOSIS — I10 Essential (primary) hypertension: Secondary | ICD-10-CM

## 2011-03-03 DIAGNOSIS — E1142 Type 2 diabetes mellitus with diabetic polyneuropathy: Secondary | ICD-10-CM

## 2011-03-03 DIAGNOSIS — Z23 Encounter for immunization: Secondary | ICD-10-CM

## 2011-03-03 DIAGNOSIS — E1149 Type 2 diabetes mellitus with other diabetic neurological complication: Secondary | ICD-10-CM

## 2011-03-03 NOTE — Progress Notes (Signed)
Anemia.  Noted in the hospital at time of surgery, hgb 8.5.  IFOB was negative here in clinic.  No other sig blood loss- though he does have some bruising on the aspirin.  No blood in stool or urine noted by pt.  Intolerant of iron after 1 month due to GERD.  H/o low platelets and relatively low white count prev.  LFTs prev normal.  No unexplained weight loss, other 'B' sx.   Diabetes:  Using medications without difficulties: yes Hypoglycemic episodes:none known Hyperglycemic episodes:none known Feet problems: no Blood Sugars averaging: not checked consistently His diet has been altered on the road with work.    Hypertension:   Using medication without problems or lightheadedness: yes Chest pain with exertion:no Edema:no Short of breath:no  L tibia fracture is healed and he's doing well with that.    PMH and SH reviewed.   Vital signs, Meds and allergies reviewed.  ROS: See HPI.  Otherwise nontributory.   GEN: nad, alert and oriented HEENT: mucous membranes moist NECK: supple w/o LA CV: rrr.  no murmur PULM: ctab, no inc wob ABD: soft, +bs EXT: no edema SKIN: no acute rash  Diabetic foot exam: Normal inspection No skin breakdown No calluses  Normal DP pulses Dec sensation to light tough and monofilament on plantar side of feet Nails thickened

## 2011-03-03 NOTE — Patient Instructions (Signed)
We'll notify you after I talk with hematology.   Come back for repeat A1c in 3 months with OV a few days later.

## 2011-03-04 ENCOUNTER — Telehealth: Payer: Self-pay | Admitting: Family Medicine

## 2011-03-04 ENCOUNTER — Encounter: Payer: Self-pay | Admitting: Family Medicine

## 2011-03-04 DIAGNOSIS — D509 Iron deficiency anemia, unspecified: Secondary | ICD-10-CM | POA: Insufficient documentation

## 2011-03-04 HISTORY — DX: Iron deficiency anemia, unspecified: D50.9

## 2011-03-04 NOTE — Assessment & Plan Note (Signed)
Controlled , no change in meds 

## 2011-03-04 NOTE — Telephone Encounter (Signed)
Please call pt.  I talked with Dr. Welton Flakes and I would like him to be seen at her clinic ([facility]).  She agrees that he should be seen, to check his blood counts.  I verbally asked for the referral and she'll have her clinic contact him about scheduling the appointment.  Thanks.

## 2011-03-04 NOTE — Telephone Encounter (Signed)
Patient notified as instructed by telephone. Patient stated that he will wait for the call from Dr. Milta Deiters office to schedule the appointment.

## 2011-03-04 NOTE — Assessment & Plan Note (Signed)
With relatively low white count and platelets without obvious cause.  Recent CMET unremarkable.  I've called Dr. Welton Flakes with heme to discuss, she's out of office today and I'll await return call then notify pt.  He agrees.

## 2011-03-04 NOTE — Assessment & Plan Note (Signed)
Inc in A1c likely related to improvement in hgb, prev a1c was likely a false depression.  Continue current meds, work on diet and recheck in 3 months.  He agrees.

## 2011-03-06 ENCOUNTER — Telehealth: Payer: Self-pay | Admitting: *Deleted

## 2011-03-06 NOTE — Telephone Encounter (Signed)
CALLED PATIENT PATIENT CONFIRMED OVER THE PHONE THE NEW DATE AND TIME OF THE APPOINTMENT ON 03-26-2011 STARTING AT 2:00 WITH LABS.

## 2011-03-26 ENCOUNTER — Other Ambulatory Visit: Payer: BC Managed Care – PPO | Admitting: Lab

## 2011-03-26 ENCOUNTER — Ambulatory Visit: Payer: BC Managed Care – PPO

## 2011-03-26 ENCOUNTER — Ambulatory Visit (HOSPITAL_BASED_OUTPATIENT_CLINIC_OR_DEPARTMENT_OTHER): Payer: BC Managed Care – PPO | Admitting: Oncology

## 2011-03-26 ENCOUNTER — Telehealth: Payer: Self-pay | Admitting: *Deleted

## 2011-03-26 VITALS — BP 185/76 | HR 80 | Temp 98.2°F | Wt 282.8 lb

## 2011-03-26 DIAGNOSIS — D649 Anemia, unspecified: Secondary | ICD-10-CM

## 2011-03-26 DIAGNOSIS — D61818 Other pancytopenia: Secondary | ICD-10-CM

## 2011-03-26 DIAGNOSIS — D469 Myelodysplastic syndrome, unspecified: Secondary | ICD-10-CM

## 2011-03-26 NOTE — Progress Notes (Signed)
Dennis Wise [id number] 02/24/1951 60 y.o. 03/26/2011 5:17 PM  CC Dr. [name]  REASON FOR CONSULTATION:  60 year old gentleman with mild pancytopenia and most recently he was found to be profoundly  anemic requiring 2 units of packed red cells and IV iron infusion.   REFERRING PHYSICIAN: Dr. [name]  HISTORY OF PRESENT ILLNESS:  Dennis Wise is a 60 y.o. male.  With multiple medical problems including gastroesophageal reflux disease hypertension long-standing history of diabetes and hyperlipidemia. Patient states that about May 2012 [facility] As part of his preop he had a CBC performed and he was found to be anemic with a hemoglobin of about 8. Because of this he had a blood transfusion and he had an iron infusion. Upon reviewing his blood work from previously patient has had mild iron deficiency for about 4 months time. His last normal hemoglobin was in about 2 years ago at 13.1. However at that time his platelets were low at 120,000. 4 months ago his hemoglobin was 12.2 with a platelet count of about 139,000. His white count also was low at 4.3. About 02/25/2011 he was seen by Dr. Cheree Ditto who noted a CBC again to have a low platelet count 138,000 white count was 4.1 and hemoglobin was on the low side at 12.2. Iron studies were also checked his total iron was 93 and percent saturation was 24.7%. 4 months ago his iron saturation was 15.2. Because of this he was referred to hematology for for further evaluation and management. Patient tells me he has never had a colonoscopy but he has had a sigmoidoscopy many years ago. He is denying having any bleeding hematuria hematochezia or melena hemoptysis hematemesis. He does have some gastroesophageal reflux disease but has not noticed any bleeding. Remainder of the 14 point review of systems is negative.  Past Medical History  Diagnosis Date  . GERD (gastroesophageal reflux disease) 10/90  . Hypertension  08/01  . Diabetes mellitus 84/90(not sure what this means but was listed in centricity like this)    type II  . Hyperlipidemia     Past Surgical History  Procedure Date  . Knee surgery 09/04    lt knee fx repair ORIF  . Tibia fracture surgery     left 2012    Family History  Problem Relation Age of Onset  . Cancer Brother     skin CA  . Colon cancer Neg Hx   . Prostate cancer Neg Hx     Social History History  Substance Use Topics  . Smoking status: Never Smoker   . Smokeless tobacco: Not on file  . Alcohol Use: Yes     extremely rare (a few glasses of wine a year)    Allergies  Allergen Reactions  . Promethazine Hcl     REACTION: AGITIATION    Current Outpatient Prescriptions  Medication Sig Dispense Refill  . ACTOS 45 MG tablet TAKE 1 TABLET BY MOUTH ONCE A DAY  90 tablet  2  . aspirin 81 MG tablet Take 81 mg by mouth daily.        Marland Kitchen atorvastatin (LIPITOR) 10 MG tablet TAKE 1 TABLET BY MOUTH AT BEDTIME  90 tablet  3  . glyBURIDE (DIABETA) 5 MG tablet TAKE 1 TABLET BY MOUTH TWICE DAILY  180 tablet  2  . metFORMIN (GLUCOPHAGE) 850 MG tablet TAKE 1 TABLET BY MOUTH 3 TIMES DAILY  270 tablet  2  . Multiple  Vitamin (MULTIVITAMIN) tablet Take 1 tablet by mouth daily.        Marland Kitchen NIASPAN 500 MG CR tablet TAKE 3 TABLETS BY MOUTH AT BEDTIME  270 tablet  2  . ramipril (ALTACE) 10 MG capsule TAKE 1 CAPSULE BY MOUTH DAILY  90 capsule  2    REVIEW OF SYSTEMS:  Pertinent items are noted in HPI.  PHYSICAL EXAMINATION:  ZOX:WRUEA, healthy, no distress, well nourished and well developed SKIN: skin color, texture, turgor are normal, no rashes or significant lesions HEAD: Normocephalic, No masses, lesions, tenderness or abnormalities EYES: normal, PERRLA, EOMI, Conjunctiva are pink and non-injected, sclera clear EARS: External ears normal OROPHARYNX:no exudate, no erythema and lips, buccal mucosa, and tongue normal  NECK: supple, no adenopathy, no bruits, no JVD, thyroid  normal size, non-tender, without nodularity LYMPH:  no palpable lymphadenopathy BREAST:not examined LUNGS: clear to auscultation , and palpation, clear to auscultation and percussion HEART: regular rate & rhythm, no murmurs and no gallops ABDOMEN:abdomen soft, non-tender, obese, normal bowel sounds and no masses or organomegaly BACK: Back symmetric, no curvature. EXTREMITIES:no joint deformities, effusion, or inflammation, no edema  NEURO: alert & oriented x 3 with fluent speech, no focal motor/sensory deficits, gait normal, reflexes normal and symmetric    ASSESSMENT    60 year old male who is being seen today at the request of Dr. Para March for evaluation of anemia and mild pancytopenia. He has been thrombocytopenic since 2 years. But anemia has become more prominent part of the picture since about for 5 months ago. He has had with Department of blood transfusions and iron infusions. Indicating that he most likely is iron deficient. His last iron studies done 4 months ago indicated that he did have a low percent saturation. He could have iron deficiency secondary to poor absorption poor intake orders loss is. Since patient has not had a colonoscopy I would suspect that he could have iron deficiency anemia secondary to a GI process. We could also need to rule out a pulled bone marrow process such as plasma cell dyscrasia or even myelodysplastic syndrome.    PLAN:    I have recommended today to proceed with checking iron studies B12 folate levels SPEP UPEP appear of also discussed bone marrow biopsy and aspirate with him today he certainly does need a colonoscopy and a referral has been made to to gastroenterology. Or further evaluation and planning of possible colonoscopy and endoscopy.     All questions were answered. The patient knows to call the clinic with any problems, questions or concerns. We can certainly see the patient much sooner if necessary.  Thank you so much for allowing me to  participate in the care of Dennis Wise. I will continue to follow up the patient with you and assist in his care.  I spent 40 minutes counseling the patient face to face. The total time spent in the appointment was 60 minutes. Drue Second, MD Medical/Oncology [facility] [phone] (beeper) [phone] (Office)  03/26/2011, 5:17 PM 03/26/2011, 5:17 PM

## 2011-03-26 NOTE — Patient Instructions (Addendum)

## 2011-03-26 NOTE — Telephone Encounter (Signed)
gave patient appointment for 03-2011 lab only 04-30-2011 at 9:00am to the dr.khan printed out calendar and gave to the patient

## 2011-03-29 ENCOUNTER — Telehealth: Payer: Self-pay | Admitting: Oncology

## 2011-03-29 NOTE — Telephone Encounter (Signed)
per MD called pt lmovm that his appt on 01/09 was moved to 01/02 and to rtn call to confirm appt

## 2011-03-31 ENCOUNTER — Telehealth: Payer: Self-pay | Admitting: Oncology

## 2011-04-02 ENCOUNTER — Encounter: Payer: Self-pay | Admitting: Internal Medicine

## 2011-04-17 ENCOUNTER — Other Ambulatory Visit (HOSPITAL_BASED_OUTPATIENT_CLINIC_OR_DEPARTMENT_OTHER): Payer: BC Managed Care – PPO | Admitting: Lab

## 2011-04-17 ENCOUNTER — Other Ambulatory Visit: Payer: Self-pay | Admitting: *Deleted

## 2011-04-17 DIAGNOSIS — E1139 Type 2 diabetes mellitus with other diabetic ophthalmic complication: Secondary | ICD-10-CM

## 2011-04-17 DIAGNOSIS — D649 Anemia, unspecified: Secondary | ICD-10-CM

## 2011-04-23 ENCOUNTER — Telehealth: Payer: Self-pay | Admitting: Oncology

## 2011-04-23 ENCOUNTER — Ambulatory Visit (HOSPITAL_BASED_OUTPATIENT_CLINIC_OR_DEPARTMENT_OTHER): Payer: BC Managed Care – PPO

## 2011-04-23 ENCOUNTER — Ambulatory Visit (HOSPITAL_BASED_OUTPATIENT_CLINIC_OR_DEPARTMENT_OTHER): Payer: BC Managed Care – PPO | Admitting: Oncology

## 2011-04-23 VITALS — BP 169/66 | HR 69 | Temp 98.2°F | Ht 74.0 in | Wt 289.4 lb

## 2011-04-23 DIAGNOSIS — D649 Anemia, unspecified: Secondary | ICD-10-CM

## 2011-04-23 DIAGNOSIS — C9000 Multiple myeloma not having achieved remission: Secondary | ICD-10-CM

## 2011-04-23 DIAGNOSIS — D8989 Other specified disorders involving the immune mechanism, not elsewhere classified: Secondary | ICD-10-CM

## 2011-04-23 LAB — CBC WITH DIFFERENTIAL/PLATELET
EOS%: 2.4 % (ref 0.0–7.0)
LYMPH%: 21.4 % (ref 14.0–49.0)
MCH: 29.1 pg (ref 27.2–33.4)
MCHC: 34.4 g/dL (ref 32.0–36.0)
MCV: 84.4 fL (ref 79.3–98.0)
MONO%: 6.4 % (ref 0.0–14.0)
Platelets: 122 10*3/uL — ABNORMAL LOW (ref 140–400)
RBC: 4.32 10*6/uL (ref 4.20–5.82)
RDW: 13.7 % (ref 11.0–14.6)

## 2011-04-23 NOTE — Patient Instructions (Signed)
Bone Marrow Aspiration and Bone Biopsy Examination of the bone marrow is a valuable test to diagnose blood disorders. A bone marrow biopsy takes a sample of bone and a small amount of fluid and cells from inside the bone. A bone marrow aspiration removes only the marrow. Bone marrow aspiration and bone biopsies are used to stage different disorders of the blood, such as leukemia. Staging will help your caregiver understand how far the disease has progressed.  The tests are also useful in diagnosing:  Fever of unknown origin (FUO).   Bacterial infections and other widespread fungal infections.   Cancers that have spread (metastasized) to the bone marrow.   Diseases that are characterized by a deficiency of an enzyme (storage diseases). This includes:   Niemann-Pick disease.   Gaucher disease.  PROCEDURE  Sites used to get samples include:   Back of your hip bone (posterior iliac crest).   Both aspiration and biopsy.   Front of your hip bone (anterior iliac crest).   Both aspiration and biopsy.   Breastbone (sternum).   Aspiration from your breastbone (done only in adults). This method is rarely used.  When you get a hip bone aspiration:  You are placed lying on your side with the upper knee brought up and flexed with the lower leg straight.   The site is prepared, cleaned with an antiseptic scrub, and draped. This keeps the biopsy area clean.   The skin and the area down to the lining of the bone (periosteum) are made numb with a local anesthetic.   The bone marrow aspiration needle is inserted. You will feel pressure on your bone.   Once inside the marrow cavity, a sample of bone marrow is sucked out (aspirated) for pathology slides.   The material collected for bone marrow slides is processed immediately by a technologist.   The technician selects the marrow particles to make the slides for pathology.   The marrow aspiration needle is removed. Then pressure is applied to  the site with gauze until bleeding has stopped.  Following an aspiration, a bone marrow biopsy may be performed as well. The technique for this is very similar. A dressing is then applied.  RISKS AND COMPLICATIONS  The main complications of a bone marrow aspiration and biopsy include infection and bleeding.   Complications are uncommon. The procedure may not be performed in patients with bleeding tendencies.   A very rare complication from the procedure is injury to the heart during a breastbone (sternal) marrow aspiration. Only bone marrow aspirations are performed in this area.   Long-lasting pain at the site of the bone marrow aspiration and biopsy is uncommon.  Your caregiver will let you know when you are to get your results and will discuss them with you. You may make an appointment with your caregiver to find out the results. Do not assume everything is normal if you have not heard from your caregiver or the medical facility. It is important for you to follow up on all of your test results. Document Released: 04/10/2004 Document Revised: 12/18/2010 Document Reviewed: 04/04/2008 Exit[employer] Patient Information 2012 [employer]. 

## 2011-04-23 NOTE — Progress Notes (Signed)
OFFICE PROGRESS NOTE  CC  [name], MD, MD [address] [place] [address]  DIAGNOSIS: 61 year old gentleman with anemia and mild pancytopenia with possible plasma cell dyscrasia  PRIOR THERAPY:none  CURRENT THERAPY:patient is currently undergoing evaluation for possible plasma cell dyscrasia he will have a bone marrow biopsy and aspirate performed on 04/25/2011  INTERVAL HISTORY: Dennis Wise 61 y.o. male returns for followup visit today. Overall he is doing well he is without any complaints. On his last visit I have performed iron studies on him his ferritin is 34. We also did a serum protein electrophoresis in light chain. Patient is found to have kappa light chains in the blood. This is concerning for a possible primary plasma cell dyscrasia/multiple myeloma. Today I discussed the results with both the patient and his wife. Clinically patient seems to be doing well he denies any fevers chills night sweats headaches shortness of breath chest pains palpitations he has no musculoskeletal discomfort no aches or pains. Remainder of the 10 point review of systems is negative.  MEDICAL HISTORY: Past Medical History  Diagnosis Date  . GERD (gastroesophageal reflux disease) 10/90  . Hypertension 08/01  . Diabetes mellitus 84/90(not sure what this means but was listed in centricity like this)    type II  . Hyperlipidemia     ALLERGIES:  is allergic to promethazine hcl.  MEDICATIONS:  Current Outpatient Prescriptions  Medication Sig Dispense Refill  . ACTOS 45 MG tablet TAKE 1 TABLET BY MOUTH ONCE A DAY  90 tablet  2  . aspirin 81 MG tablet Take 81 mg by mouth daily.        Marland Kitchen atorvastatin (LIPITOR) 10 MG tablet TAKE 1 TABLET BY MOUTH AT BEDTIME  90 tablet  3  . glyBURIDE (DIABETA) 5 MG tablet TAKE 1 TABLET BY MOUTH TWICE DAILY  180 tablet  2  . metFORMIN (GLUCOPHAGE) 850 MG tablet TAKE 1 TABLET BY MOUTH 3 TIMES DAILY  270 tablet  2  . Multiple Vitamin (MULTIVITAMIN)  tablet Take 1 tablet by mouth daily.        Marland Kitchen NIASPAN 500 MG CR tablet TAKE 3 TABLETS BY MOUTH AT BEDTIME  270 tablet  2  . ramipril (ALTACE) 10 MG capsule TAKE 1 CAPSULE BY MOUTH DAILY  90 capsule  2    SURGICAL HISTORY:  Past Surgical History  Procedure Date  . Knee surgery 09/04    lt knee fx repair ORIF  . Tibia fracture surgery     left 2012    REVIEW OF SYSTEMS:  Pertinent items are noted in HPI.   PHYSICAL EXAMINATION: General appearance: alert, cooperative and appears stated age Resp: clear to auscultation bilaterally and normal percussion bilaterally Cardio: regular rate and rhythm, S1, S2 normal, no murmur, click, rub or gallop GI: soft, non-tender; bowel sounds normal; no masses,  no organomegaly Extremities: extremities normal, atraumatic, no cyanosis or edema Neurologic: Alert and oriented X 3, normal strength and tone. Normal symmetric reflexes. Normal coordination and gait  ECOG PERFORMANCE STATUS: 0 - Asymptomatic  Blood pressure 169/66, pulse 69, temperature 98.2 F (36.8 C), temperature source Oral, height 6\' 2"  (1.88 m), weight 289 lb 6.4 oz (131.271 kg).  LABORATORY DATA: Lab Results  Component Value Date   WBC 4.1* 02/25/2011   HGB 12.2* 02/25/2011   HCT 35.2* 02/25/2011   MCV 85.9 02/25/2011   PLT 138.0* 02/25/2011      Chemistry      Component Value Date/Time  NA 143 10/22/2010 0907   K 4.8 10/22/2010 0907   CL 106 10/22/2010 0907   CO2 31 10/22/2010 0907   BUN 17 10/22/2010 0907   CREATININE 0.9 10/22/2010 0907      Component Value Date/Time   CALCIUM 9.0 10/22/2010 0907   ALKPHOS 98 10/22/2010 0907   AST 14 10/22/2010 0907   ALT 12 10/22/2010 0907   BILITOT 0.4 10/22/2010 0907       RADIOGRAPHIC STUDIES:  No results found.  ASSESSMENT: 61 year old gentleman with  Mild pancytopenia and possible light chain disease/plasma cell dyscrasia. I have discussed the patient's lab results with him today.   PLAN: we will plan on proceeding with a bone marrow biopsy  and aspirate. This will be performed on 04/25/2011 at 8 in the morning in the cancer center infusion room.   All questions were answered. The patient knows to call the clinic with any problems, questions or concerns. We can certainly see the patient much sooner if necessary.  I spent 25 minutes counseling the patient face to face. The total time spent in the appointment was 30 minutes.    Drue Second, MD Medical/Oncology [facility] [phone] (beeper) [phone] (Office)  04/23/2011, 10:19 AM

## 2011-04-23 NOTE — Telephone Encounter (Signed)
gv pt appt for jan2013 °

## 2011-04-25 ENCOUNTER — Encounter (HOSPITAL_BASED_OUTPATIENT_CLINIC_OR_DEPARTMENT_OTHER): Payer: BC Managed Care – PPO | Admitting: Oncology

## 2011-04-25 ENCOUNTER — Telehealth: Payer: Self-pay | Admitting: Oncology

## 2011-04-25 ENCOUNTER — Other Ambulatory Visit: Payer: Self-pay | Admitting: Oncology

## 2011-04-25 ENCOUNTER — Encounter: Payer: BC Managed Care – PPO | Admitting: Oncology

## 2011-04-25 ENCOUNTER — Other Ambulatory Visit (HOSPITAL_COMMUNITY)
Admission: RE | Admit: 2011-04-25 | Discharge: 2011-04-25 | Disposition: A | Discharge status: Home / Self Care | Payer: BC Managed Care – PPO | Source: Ambulatory Visit | Attending: Oncology | Admitting: Oncology

## 2011-04-25 ENCOUNTER — Telehealth: Payer: Self-pay | Admitting: *Deleted

## 2011-04-25 DIAGNOSIS — D61818 Other pancytopenia: Secondary | ICD-10-CM

## 2011-04-25 DIAGNOSIS — E8809 Other disorders of plasma-protein metabolism, not elsewhere classified: Secondary | ICD-10-CM | POA: Insufficient documentation

## 2011-04-25 NOTE — Progress Notes (Signed)
Bone Marrow Biopsy and Aspiration Procedure Note   Informed consent was obtained and potential risks including bleeding, infection and pain were reviewed with the patient.   Posterior iliac crest(s) prepped with Betadine.   Lidocaine 2% local anesthesia infiltrated into the subcutaneous tissue.  Left bone marrow biopsy and left bone marrow aspirate was obtained.   The procedure was tolerated well and there were no complications.  Specimens sent for: routine histopathologic stains and sectioning, flow cytometry and cytogenetics   Dennis Second, MD Medical/Oncology [facility], [place] [phone] (beeper) [phone] (Office)  04/25/2011, 8:52 AM   This encounter was created in error - please disregard.

## 2011-04-25 NOTE — Telephone Encounter (Signed)
Gv pt aptp for jan2013

## 2011-04-25 NOTE — Telephone Encounter (Signed)
Notified pt Vitamin B12 normal

## 2011-04-25 NOTE — Telephone Encounter (Signed)
Message copied by GARNER, Gerald Leitz on Fri Apr 25, 2011 12:25 PM ------      Message from: Victorino December      Created: Mon Apr 21, 2011  1:37 PM       Call patient: B12 level normal

## 2011-04-25 NOTE — Progress Notes (Signed)
Dresing CDI, vitals stable, pt has no complaints, instructed pt to keep dressing on for 24 hours, SLJ This encounter was created in error - please disregard.

## 2011-04-26 LAB — PROTEIN, URINE, 24 HOUR
Collection Interval-UPROT: 24 hours
Protein, 24H Urine: 578 mg/d — ABNORMAL HIGH (ref 50–100)
Protein, ur: 17 mg/dL

## 2011-04-29 NOTE — Progress Notes (Signed)
Error previous note: 04/29/11: forwarded Bone Marrow Report to Dr. Welton Flakes.

## 2011-04-29 NOTE — Progress Notes (Signed)
Received Bone Marrow Report; forwarded to Dr. Gaylyn Rong.

## 2011-04-30 ENCOUNTER — Ambulatory Visit: Payer: BC Managed Care – PPO | Admitting: Oncology

## 2011-04-30 LAB — IRON AND TIBC
Iron: 133 ug/dL (ref 42–165)
TIBC: 380 ug/dL (ref 215–435)
UIBC: 247 ug/dL (ref 125–400)

## 2011-04-30 LAB — SPEP & IFE WITH QIG
Albumin ELP: 60.5 % (ref 55.8–66.1)
Alpha-2-Globulin: 10.2 % (ref 7.1–11.8)
Beta 2: 5.5 % (ref 3.2–6.5)
Beta Globulin: 6.9 % (ref 4.7–7.2)
IgA: 205 mg/dL (ref 68–379)
IgG (Immunoglobin G), Serum: 935 mg/dL (ref 650–1600)

## 2011-04-30 LAB — KAPPA/LAMBDA LIGHT CHAINS: Lambda Free Lght Chn: 1.62 mg/dL (ref 0.57–2.63)

## 2011-04-30 LAB — FOLATE: Folate: >20 ng/mL

## 2011-05-01 ENCOUNTER — Encounter: Payer: Self-pay | Admitting: Internal Medicine

## 2011-05-01 ENCOUNTER — Ambulatory Visit (INDEPENDENT_AMBULATORY_CARE_PROVIDER_SITE_OTHER): Payer: BC Managed Care – PPO | Admitting: Internal Medicine

## 2011-05-01 DIAGNOSIS — R609 Edema, unspecified: Secondary | ICD-10-CM

## 2011-05-01 DIAGNOSIS — R600 Localized edema: Secondary | ICD-10-CM

## 2011-05-01 DIAGNOSIS — D509 Iron deficiency anemia, unspecified: Secondary | ICD-10-CM

## 2011-05-01 DIAGNOSIS — L97909 Non-pressure chronic ulcer of unspecified part of unspecified lower leg with unspecified severity: Secondary | ICD-10-CM

## 2011-05-01 DIAGNOSIS — D61818 Other pancytopenia: Secondary | ICD-10-CM

## 2011-05-01 HISTORY — DX: Other pancytopenia: D61.818

## 2011-05-01 MED ORDER — PEG-KCL-NACL-NASULF-NA ASC-C 100 G PO SOLR: 1.0000 | Freq: Once | ORAL | Status: DC

## 2011-05-01 NOTE — Assessment & Plan Note (Addendum)
The cause is not clear. It is likely multifactorial, he lost some blood earlier in the year with his leg fracture and surgery. That may have depleted his iron. He seems to eat foods containing iron and malabsorption seems pretty unlikely. He has had negative Hemoccults, but has never had a colonoscopy or upper endoscopy. I recommended both of these were further evaluation to look for causes of occult GI blood loss in the upper and lower GI tract. He understands the risks benefits and indications as well as alternatives agrees to proceed.

## 2011-05-01 NOTE — Progress Notes (Signed)
Subjective:    Patient ID: Dennis Wise, male    DOB: 10-09-1950, 61 y.o.   MRN: [id number]  HPI This is a pleasant man sent for evaluation of iron deficiency anemia. He was noted to be anemic after a left tibial plateau fracture, prior surgery earlier this year. Hemoglobin in the 8 range. Subsequently his iron was low, he had negative Hemoccults. He tried oral iron but could not tolerate that. He also had a pancytopenia picture, and a hematology workup is underway. Recently had a bone marrow biopsy because of possible plasma cell dyscrasia, that does not seem to be the case though he has some positive light chains but S. Pat did not show anything monoclonal. He is due to follow up with Dr. Welton Flakes about the bone marrow results next week, we did discuss those in clinic today to the best of my ability but I defer to his hematologist for final information His GI review of systems is negative. He had a flexible sigmoidoscopy 4 years ago by a physician named Dr. Marney Setting, and says it showed hemorrhoids.  Allergies  Allergen Reactions  . Promethazine Hcl     REACTION: AGITIATION   Outpatient Prescriptions Prior to Visit  Medication Sig Dispense Refill  . ACTOS 45 MG tablet TAKE 1 TABLET BY MOUTH ONCE A DAY  90 tablet  2  . aspirin 81 MG tablet Take 81 mg by mouth daily.        Marland Kitchen atorvastatin (LIPITOR) 10 MG tablet TAKE 1 TABLET BY MOUTH AT BEDTIME  90 tablet  3  . glyBURIDE (DIABETA) 5 MG tablet TAKE 1 TABLET BY MOUTH TWICE DAILY  180 tablet  2  . metFORMIN (GLUCOPHAGE) 850 MG tablet TAKE 1 TABLET BY MOUTH 3 TIMES DAILY  270 tablet  2  . Multiple Vitamin (MULTIVITAMIN) tablet Take 1 tablet by mouth daily.        Marland Kitchen NIASPAN 500 MG CR tablet TAKE 3 TABLETS BY MOUTH AT BEDTIME  270 tablet  2  . ramipril (ALTACE) 10 MG capsule TAKE 1 CAPSULE BY MOUTH DAILY  90 capsule  2   Past Medical History  Diagnosis Date  . GERD (gastroesophageal reflux disease) 10/90  . Hypertension 08/01  . Diabetes mellitus  84/90(not sure what this means but was listed in centricity like this)    type II  . Hyperlipidemia   . Internal hemorrhoids   . Anemia     Iron deficiency part of this  . Pneumonia   . Pancytopenia    Past Surgical History  Procedure Date  . Knee surgery 09/04    lt knee fx repair ORIF  . Tibia fracture surgery     left 2012  . Flexible sigmoidoscopy 05/1988    internal hemorrhoids   History   Social History  . Marital Status: Married    Spouse Name: N/A    Number of Children: N/A  . Years of Education: N/A   Social History Main Topics  . Smoking status: Never Smoker   . Smokeless tobacco: Never Used  . Alcohol Use: Yes     extremely rare (a few glasses of wine a year)  . Drug Use: No  . Sexually Active: None   Other Topics Concern  . None   Social History Narrative   Traveling for sports broadcasting.Married [id number] child   Family History  Problem Relation Age of Onset  . Skin cancer Brother   . Emphysema Father   . Cancer Mother     ?  Marland Kitchen  Cancer Father     ?  . Diabetes Mother   . Diabetes Brother   . Heart failure Mother   . Alzheimer's disease Father       Allergies  Allergen Reactions  . Promethazine Hcl     REACTION: AGITIATION   Outpatient Prescriptions Prior to Visit  Medication Sig Dispense Refill  . ACTOS 45 MG tablet TAKE 1 TABLET BY MOUTH ONCE A DAY  90 tablet  2  . aspirin 81 MG tablet Take 81 mg by mouth daily.        Marland Kitchen atorvastatin (LIPITOR) 10 MG tablet TAKE 1 TABLET BY MOUTH AT BEDTIME  90 tablet  3  . glyBURIDE (DIABETA) 5 MG tablet TAKE 1 TABLET BY MOUTH TWICE DAILY  180 tablet  2  . metFORMIN (GLUCOPHAGE) 850 MG tablet TAKE 1 TABLET BY MOUTH 3 TIMES DAILY  270 tablet  2  . Multiple Vitamin (MULTIVITAMIN) tablet Take 1 tablet by mouth daily.        Marland Kitchen NIASPAN 500 MG CR tablet TAKE 3 TABLETS BY MOUTH AT BEDTIME  270 tablet  2  . ramipril (ALTACE) 10 MG capsule TAKE 1 CAPSULE BY MOUTH DAILY  90 capsule  2   Past Medical History    Diagnosis Date  . GERD (gastroesophageal reflux disease) 10/90  . Hypertension 08/01  . Diabetes mellitus 84/90(not sure what this means but was listed in centricity like this)    type II  . Hyperlipidemia   . Internal hemorrhoids   . Anemia     Iron deficiency part of this  . Pneumonia   . Pancytopenia    Past Surgical History  Procedure Date  . Knee surgery 09/04    lt knee fx repair ORIF  . Tibia fracture surgery     left 2012  . Flexible sigmoidoscopy 05/1988    internal hemorrhoids   History   Social History  . Marital Status: Married    Spouse Name: N/A    Number of Children: N/A  . Years of Education: N/A   Social History Main Topics  . Smoking status: Never Smoker   . Smokeless tobacco: Never Used  . Alcohol Use: Yes     extremely rare (a few glasses of wine a year)  . Drug Use: No  . Sexually Active: None   Other Topics Concern  . 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 Skin cancer Brother   . Emphysema Father   . Cancer Mother     ?  Marland Kitchen Cancer Father     ?  . Diabetes Mother   . Diabetes Brother   . Heart failure Mother   . Alzheimer's disease Father         Review of Systems He's had some edema of the lower extremities, also erythema in the pretibial area and a small ulcer developed in the last week or so on the left lower family. It is not painful there is no warmth or discharge. He says that he has problems if he wears socks higher than the ankle. Is also tried some compression stockings but they apparently cause some difficulties as well. He does not recall any trauma to this area. He wears eyeglasses.  All other systems is negative.    Objective:   Physical Exam General:  Well-developed, well-nourished and in no acute distress he is obese Eyes:  anicteric. ENT:   Mouth and posterior pharynx free of lesions.  Neck:   supple w/o  thyromegaly or mass.  Lungs: Clear to auscultation bilaterally. Heart:  S1S2, no rubs, murmurs, gallops. Abdomen:  Obese, soft, non-tender, no hepatosplenomegaly, hernia, or mass and BS+.  Rectal: Deferred until colonoscopy Lymph:  no cervical or supraclavicular adenopathy. Extremities:   There is bilateral lower extremity edema 1+ into the mid-upper pretibial areas. In the left lower extremity there is   a scar in the pretibial area, from prior surgery. In the lower half of the leg there is 6-8 cm area of erythema    probably 3-4 cm wide, slightly warm to the touch but not really hot or warm, below this just above the ankle area   on the medial aspect is a small circular ulcer with eschar, which looks clean and has no surrounding erythema    and no purulence. The feet are without erythema or ulcer Skin   no rash. Neuro:  A&O x 3.  Psych:  appropriate mood and  Affect.   Data Reviewed: SPEP, without monoclonal protein though he has Kappa free like chains. The bone marrow biopsy looked overall normal at 3% plasma cells. His ferritin was 34, B12 methylmalonic acid homocystine all normal. Platelets and white blood cell count are slightly low. Also reviewed primary care and hematology office notes.         Assessment & Plan:   1. Iron deficiency anemia, unspecified   2. Lower extremity edema   3. Leg ulcer   4. Pancytopenia, acquired    See below for the iron deficiency anemia assessment and plan.  Regarding lower extremity edema and leg ulcer, he has some erythema in the lower extremities mainly on the left, I think that he probably has venous stasis issues. He was advised to elevate his legs and followup with Dr. Para March regarding this. He is a diabetic, he is certainly at increased risk of problems there, it sounds like he said difficulty with compression stockings but there may be other treatments that would help.

## 2011-05-01 NOTE — Patient Instructions (Addendum)
Keep your legs elevated and followup with Dr. Para March about the redness, ulcer and swelling of the legs. Right now does not look infected but that is always a possibility. It seems like he should see him within 2-3 weeks. You have been scheduled for an Endoscopy/Colonoscopy with separate instructions given. Your prep kit has been sent to your pharmacy for you to pick up.

## 2011-05-09 ENCOUNTER — Ambulatory Visit (HOSPITAL_BASED_OUTPATIENT_CLINIC_OR_DEPARTMENT_OTHER): Payer: BC Managed Care – PPO | Admitting: Oncology

## 2011-05-09 ENCOUNTER — Other Ambulatory Visit: Payer: BC Managed Care – PPO | Admitting: Lab

## 2011-05-09 VITALS — BP 185/77 | HR 73 | Temp 98.2°F | Ht 74.0 in | Wt 286.3 lb

## 2011-05-09 DIAGNOSIS — D472 Monoclonal gammopathy: Secondary | ICD-10-CM

## 2011-05-09 DIAGNOSIS — D61818 Other pancytopenia: Secondary | ICD-10-CM

## 2011-05-09 DIAGNOSIS — D649 Anemia, unspecified: Secondary | ICD-10-CM

## 2011-05-09 LAB — CBC WITH DIFFERENTIAL/PLATELET
BASO%: 0.4 % (ref 0.0–2.0)
Eosinophils Absolute: 0.1 10*3/uL (ref 0.0–0.5)
LYMPH%: 34.5 % (ref 14.0–49.0)
MCHC: 34.8 g/dL (ref 32.0–36.0)
MONO#: 0.4 10*3/uL (ref 0.1–0.9)
NEUT#: 2.2 10*3/uL (ref 1.5–6.5)
Platelets: 144 10*3/uL (ref 140–400)
RBC: 4.4 10*6/uL (ref 4.20–5.82)
RDW: 13.5 % (ref 11.0–14.6)
WBC: 4.2 10*3/uL (ref 4.0–10.3)

## 2011-05-12 NOTE — Progress Notes (Signed)
OFFICE PROGRESS NOTE  CC  Dennis Givens, MD, MD [address] [place] [address]  DIAGNOSIS: 61 year old gentleman with anemia and mild pancytopenia with possible plasma cell dyscrasia  PRIOR THERAPY:  #1 patient is status post bone marrow biopsy and aspirate for evaluation of plasma cell dyscrasia and a  CURRENT THERAPY:  INTERVAL HISTORY: Dennis Wise 61 y.o. male returns for followup visit today. Mr. with essentially returns today for followup visit to discuss his bone marrow biopsy and aspirate results. The bone marrow revealed normal cellularity for patient's age. There were 3% plasma cells noted on the bone marrow. There was no over abundance of plasma cells she eats. Patient's diagnosis is not consistent with a plasma cell dyscrasia. There was no evidence of a myelodysplastic syndrome type picture either. His bone marrow was essentially nonspecific. He did have a CBC performed today his white count is normal and his hemoglobin is stable and his platelets are normal today as well. I have discussed the bone marrow biopsy results with the patient. Patient most likely has a monoclonal gammopathy of uncertain significance but not a true full-blown multiple myeloma or plasma cell dyscrasia. MEDICAL HISTORY: Past Medical History  Diagnosis Date  . GERD (gastroesophageal reflux disease) 10/90  . Hypertension 08/01  . Diabetes mellitus 84/90(not sure what this means but was listed in centricity like this)    type II  . Hyperlipidemia   . Internal hemorrhoids   . Anemia     Iron deficiency part of this  . Pneumonia   . Pancytopenia     ALLERGIES:  is allergic to promethazine hcl.  MEDICATIONS:  Current Outpatient Prescriptions  Medication Sig Dispense Refill  . ACTOS 45 MG tablet TAKE 1 TABLET BY MOUTH ONCE A DAY  90 tablet  2  . aspirin 81 MG tablet Take 81 mg by mouth daily.        Marland Kitchen atorvastatin (LIPITOR) 10 MG tablet TAKE 1 TABLET BY MOUTH AT BEDTIME  90 tablet   3  . glyBURIDE (DIABETA) 5 MG tablet TAKE 1 TABLET BY MOUTH TWICE DAILY  180 tablet  2  . metFORMIN (GLUCOPHAGE) 850 MG tablet TAKE 1 TABLET BY MOUTH 3 TIMES DAILY  270 tablet  2  . Multiple Vitamin (MULTIVITAMIN) tablet Take 1 tablet by mouth daily.        Marland Kitchen NIASPAN 500 MG CR tablet TAKE 3 TABLETS BY MOUTH AT BEDTIME  270 tablet  2  . peg 3350 powder (MOVIPREP) 100 G SOLR Take 1 kit (100 g total) by mouth once.  1 kit  0  . ramipril (ALTACE) 10 MG capsule TAKE 1 CAPSULE BY MOUTH DAILY  90 capsule  2    SURGICAL HISTORY:  Past Surgical History  Procedure Date  . Knee surgery 09/04    lt knee fx repair ORIF  . Tibia fracture surgery     left 2012  . Flexible sigmoidoscopy 05/1988    internal hemorrhoids   ECOG PERFORMANCE STATUS: 0 - Asymptomatic  Blood pressure 185/77, pulse 73, temperature 98.2 F (36.8 C), height 6\' 2"  (1.88 m), weight 286 lb 4.8 oz (129.865 kg).  LABORATORY DATA: Lab Results  Component Value Date   WBC 4.2 05/09/2011   HGB 12.9* 05/09/2011   HCT 37.0* 05/09/2011   MCV 83.9 05/09/2011   PLT 144 05/09/2011      Chemistry      Component Value Date/Time   NA 143 10/22/2010 0907   K 4.8  10/22/2010 0907   CL 106 10/22/2010 0907   CO2 31 10/22/2010 0907   BUN 17 10/22/2010 0907   CREATININE 0.9 10/22/2010 0907      Component Value Date/Time   CALCIUM 9.0 10/22/2010 0907   ALKPHOS 98 10/22/2010 0907   AST 14 10/22/2010 0907   ALT 12 10/22/2010 0907   BILITOT 0.4 10/22/2010 0907       RADIOGRAPHIC STUDIES:  No results found.  ASSESSMENT: 61 year old gentleman with  Mild pancytopenia and possible light chain disease/plasma cell dyscrasia. The patient and I and his wife discussed the bone marrow biopsy and aspirate results. I have given him a tentative diagnosis of MGUS. He does not fit the criteria of having a multiple myeloma. I think his anemia most likely is anemia of chronic disease or iron deficiency and that can certainly be be managed with oral iron supplements such as  multivitamins with iron. His counts are recovering worked very quickly and I am pleased.   PLAN: I have recommended continuing to follow the patient every 3 months and we will at that time schedule him multiple myeloma panel.  All questions were answered. The patient knows to call the clinic with any problems, questions or concerns. We can certainly see the patient much sooner if necessary.  I spent 25 minutes counseling the patient face to face. The total time spent in the appointment was 30 minutes.    Drue Second, MD Medical/Oncology [facility] [phone] (beeper) [phone] (Office)  05/12/2011, 9:55 PM

## 2011-05-29 ENCOUNTER — Encounter: Payer: BC Managed Care – PPO | Admitting: Internal Medicine

## 2011-06-05 ENCOUNTER — Encounter: Payer: Self-pay | Admitting: Internal Medicine

## 2011-06-05 ENCOUNTER — Ambulatory Visit (AMBULATORY_SURGERY_CENTER): Payer: BC Managed Care – PPO | Admitting: Internal Medicine

## 2011-06-05 VITALS — BP 190/100 | HR 72 | Temp 98.0°F | Resp 14 | Ht 74.0 in | Wt 288.0 lb

## 2011-06-05 DIAGNOSIS — K2990 Gastroduodenitis, unspecified, without bleeding: Secondary | ICD-10-CM

## 2011-06-05 DIAGNOSIS — D126 Benign neoplasm of colon, unspecified: Secondary | ICD-10-CM

## 2011-06-05 DIAGNOSIS — K2970 Gastritis, unspecified, without bleeding: Secondary | ICD-10-CM

## 2011-06-05 DIAGNOSIS — D509 Iron deficiency anemia, unspecified: Secondary | ICD-10-CM

## 2011-06-05 DIAGNOSIS — K2960 Other gastritis without bleeding: Secondary | ICD-10-CM

## 2011-06-05 MED ORDER — SODIUM CHLORIDE 0.9 % IV SOLN: 500.0000 mL | INTRAVENOUS | Status: DC

## 2011-06-05 MED ORDER — OMEPRAZOLE 20 MG PO CPDR: 20.0000 mg | DELAYED_RELEASE_CAPSULE | Freq: Every day | ORAL | Status: DC

## 2011-06-05 NOTE — Progress Notes (Signed)
No complaints noted in the recovery room. Maw  Patient did not have preoperative order for IV antibiotic SSI prophylaxis. (G8918) Patient did not experience any of the following events: a burn prior to discharge; a fall within the facility; wrong site/side/patient/procedure/implant event; or a hospital transfer or hospital admission upon discharge from the facility. (G8907)  

## 2011-06-05 NOTE — Op Note (Signed)
[facility] [address] [place], [place]  [address]  ENDOSCOPY PROCEDURE REPORT  PATIENT:  Dennis, Wise  MR#:  [id number] BIRTHDATE:  1950-09-23, 60 yrs. old  GENDER:  male  ENDOSCOPIST:  Iva Boop, MD, [facility] Referred by:  [name], M.D.  PROCEDURE DATE:  06/05/2011 PROCEDURE:  EGD with biopsy, 43239 ASA CLASS:  Class II INDICATIONS:  iron deficiency anemia  MEDICATIONS:   These medications were titrated to patient response per physician's verbal order, Fentanyl 100 mcg IV, Versed 10 mg IV TOPICAL ANESTHETIC:  Cetacaine Spray  DESCRIPTION OF PROCEDURE:   After the risks benefits and alternatives of the procedure were thoroughly explained, informed consent was obtained.  The LB GIF-H180 [id number] endoscope was introduced through the mouth and advanced to the second portion of the duodenum, without limitations.  The instrument was slowly withdrawn as the mucosa was fully examined. <<PROCEDUREIMAGES>>  Multiple erosions were found in the antrum. Associated erythema. Multiple biopsies were obtained and sent to pathology.  Moderate gastritis was found in the body of the stomach. Multiple biopsies were obtained and sent to pathology.  Otherwise the examination was normal.    Retroflexed views revealed no abnormalities.    The scope was then withdrawn from the patient and the procedure completed.  COMPLICATIONS:  None  ENDOSCOPIC IMPRESSION: 1) Erosions, multiple in the antrum - could be related to anemia  2) Moderate gastritis in the body of the stomach 3) Otherwise normal examination RECOMMENDATIONS: 1) Await biopsy results 2) Start omeprazole 20 mg daily 3) See colonoscopy report.  Iva Boop, MD, [facility]  CC:  [name], MD and The Patient  n. eSIGNED:   Iva Boop at 06/05/2011 02:28 PM  Lynett Fish, [phone]

## 2011-06-05 NOTE — Patient Instructions (Addendum)
Please see the procedure reports for explanation of findings. I have prescribed omeprazole 20 mg daily to heal the stomach. You can get that at your pharmacy later today or tomorrow is ok. Iva Boop, MD, [facility]   Resume your prior medications today. Please call if any questions or concerns.

## 2011-06-05 NOTE — Op Note (Signed)
[facility] [address] [place], [place]  [address]  COLONOSCOPY PROCEDURE REPORT  PATIENT:  Dennis Wise, Dennis Wise  MR#:  [id number] BIRTHDATE:  Aug 09, 1950, 60 yrs. old  GENDER:  male ENDOSCOPIST:  Iva Boop, MD, [facility] REF. BY:  [name], M.D. PROCEDURE DATE:  06/05/2011 PROCEDURE:  Colonoscopy with biopsy and snare polypectomy ASA CLASS:  Class II INDICATIONS:  Iron deficiency anemia MEDICATIONS:   There was residual sedation effect present from prior procedure., These medications were titrated to patient response per physician's verbal order, Fentanyl 25 mcg, Versed 3 mg IV  DESCRIPTION OF PROCEDURE:   After the risks benefits and alternatives of the procedure were thoroughly explained, informed consent was obtained.  Digital rectal exam was performed and revealed no abnormalities and normal prostate.   The LB 180AL [id number] endoscope was introduced through the anus and advanced to the cecum, which was identified by both the appendix and ileocecal valve, without limitations.  The quality of the prep was excellent, using MoviPrep.  The instrument was then slowly withdrawn as the colon was fully examined. <<PROCEDUREIMAGES>>  FINDINGS:  There were multiple polyps identified and removed. Twelve polyps removed. Sizes 3 mm - 7 mm. Cold snare and cold biopsy removal. All sent to pathology. Four transverse polyps, 2 descending, 5 sigmoid and 1 rectal polyp.  This was otherwise a normal examination of the colon.   Retroflexed views in the rectum revealed no other findings other than those already described. The time to cecum = 2:07 minutes. The scope was then withdrawn in 28:04 minutes from the cecum and the procedure completed. COMPLICATIONS:  None ENDOSCOPIC IMPRESSION: 1) Polyps, 12 removed, largest 7 mm. 2) Otherwise normal examination, excellent prep  REPEAT EXAM:  In for Colonoscopy, pending biopsy results.  Follow-up with Dr. Para March re: anemia (and Dr.  Welton Flakes)  Iva Boop, MD, [facility]  CC:  [name], MD and The Patient  n. eSIGNED:   Iva Boop at 06/05/2011 02:34 PM  Lynett Fish, [phone]

## 2011-06-06 ENCOUNTER — Telehealth: Payer: Self-pay | Admitting: *Deleted

## 2011-06-06 NOTE — Telephone Encounter (Signed)
No answer, number on voicemail as identifier. Message left to call if any questions or concerns.

## 2011-06-11 ENCOUNTER — Encounter: Payer: Self-pay | Admitting: Internal Medicine

## 2011-06-11 ENCOUNTER — Encounter: Payer: Self-pay | Admitting: Family Medicine

## 2011-06-11 DIAGNOSIS — B9681 Helicobacter pylori [H. pylori] as the cause of diseases classified elsewhere: Secondary | ICD-10-CM | POA: Insufficient documentation

## 2011-06-11 DIAGNOSIS — K2970 Gastritis, unspecified, without bleeding: Secondary | ICD-10-CM | POA: Insufficient documentation

## 2011-06-11 HISTORY — DX: Helicobacter pylori (H. pylori) as the cause of diseases classified elsewhere: B96.81

## 2011-06-11 NOTE — Progress Notes (Signed)
Quick Note:  1) H. Pylori gastritis - needs treatment with Pylera. Once he has completed that can stop omeprazole.  2) two weeks (at least) after stopping omeprazole needs stool antigen for H. Pylori 3) colon polyps were all benign - I will send a letter about that.  Repeat colonoscopy 1 year due to numerous polyps ([facility] will place recall) ______

## 2011-06-12 ENCOUNTER — Other Ambulatory Visit: Payer: Self-pay

## 2011-06-12 DIAGNOSIS — A048 Other specified bacterial intestinal infections: Secondary | ICD-10-CM

## 2011-06-12 MED ORDER — BIS SUBCIT-METRONID-TETRACYC 140-125-125 MG PO CAPS: ORAL_CAPSULE | ORAL | Status: DC

## 2011-07-14 ENCOUNTER — Other Ambulatory Visit: Payer: BC Managed Care – PPO | Admitting: Lab

## 2011-07-14 ENCOUNTER — Ambulatory Visit: Payer: BC Managed Care – PPO | Admitting: Oncology

## 2011-07-17 ENCOUNTER — Other Ambulatory Visit: Payer: BC Managed Care – PPO

## 2011-07-17 DIAGNOSIS — A048 Other specified bacterial intestinal infections: Secondary | ICD-10-CM

## 2011-07-24 LAB — HELICOBACTER PYLORI  SPECIAL ANTIGEN

## 2011-07-25 NOTE — Progress Notes (Signed)
Quick Note:  This tells [place] H. Pylori is gone - assuming he is off the omeprazole 2 weeks before the test. Please give him the results and clarify omeprazole use - he does not need to stay on it - if he was the test needs to be repeated 2 weeks after stopping ______

## 2011-08-20 ENCOUNTER — Telehealth: Payer: Self-pay | Admitting: *Deleted

## 2011-08-20 ENCOUNTER — Ambulatory Visit (HOSPITAL_BASED_OUTPATIENT_CLINIC_OR_DEPARTMENT_OTHER): Payer: BC Managed Care – PPO | Admitting: Oncology

## 2011-08-20 ENCOUNTER — Encounter: Payer: Self-pay | Admitting: Oncology

## 2011-08-20 ENCOUNTER — Other Ambulatory Visit: Payer: Self-pay | Admitting: Oncology

## 2011-08-20 ENCOUNTER — Other Ambulatory Visit (HOSPITAL_BASED_OUTPATIENT_CLINIC_OR_DEPARTMENT_OTHER): Payer: BC Managed Care – PPO | Admitting: Lab

## 2011-08-20 VITALS — BP 178/70 | HR 63 | Temp 98.1°F | Ht 74.0 in | Wt 288.5 lb

## 2011-08-20 DIAGNOSIS — D61818 Other pancytopenia: Secondary | ICD-10-CM

## 2011-08-20 DIAGNOSIS — D509 Iron deficiency anemia, unspecified: Secondary | ICD-10-CM

## 2011-08-20 DIAGNOSIS — D472 Monoclonal gammopathy: Secondary | ICD-10-CM

## 2011-08-20 LAB — CBC WITH DIFFERENTIAL/PLATELET
BASO%: 0.5 % (ref 0.0–2.0)
Basophils Absolute: 0 10*3/uL (ref 0.0–0.1)
Eosinophils Absolute: 0.1 10*3/uL (ref 0.0–0.5)
HCT: 37.4 % — ABNORMAL LOW (ref 38.4–49.9)
HGB: 12.6 g/dL — ABNORMAL LOW (ref 13.0–17.1)
MONO#: 0.4 10*3/uL (ref 0.1–0.9)
NEUT#: 2.7 10*3/uL (ref 1.5–6.5)
NEUT%: 62 % (ref 39.0–75.0)
WBC: 4.3 10*3/uL (ref 4.0–10.3)
lymph#: 1.1 10*3/uL (ref 0.9–3.3)

## 2011-08-20 NOTE — Patient Instructions (Signed)
1. You are doing well.  2. We will call you with results of your blood tests  3. I will see you back in 6 months (02/27/12)

## 2011-08-20 NOTE — Progress Notes (Signed)
OFFICE PROGRESS NOTE  CC  [name], MD, MD [address] [place] [address]  DIAGNOSIS: 61 year old gentleman with MGUS  PRIOR THERAPY:  1. When a bone marrow biopsy and aspirate that revealed 3% plasma cells in the bone marrow. With monoclonal gammopathy of uncertain significance.(MGUS)  CURRENT THERAPY: Observation  INTERVAL HISTORY: Dennis Wise 61 y.o. male returns for followup visit today. Patient is seen in followup today overall he is doing well he is without any significant complaints. He continues to work full-time. He denies any headaches double vision blurring of vision fevers chills night sweats shortness of breath chest pains or palpitations he has no peripheral paresthesias no difficulty in walking no tingling or numbness. He has no excessive bleeding he has not had any recent hospitalizations cold fluids or infections. Remainder of the 10 point review of systems is negative. MEDICAL HISTORY: Past Medical History  Diagnosis Date  . GERD (gastroesophageal reflux disease) 10/90  . Hypertension 08/01  . Diabetes mellitus 84/90(not sure what this means but was listed in centricity like this)    type II  . Hyperlipidemia   . Internal hemorrhoids   . Anemia     Iron deficiency part of this  . Pneumonia   . Pancytopenia     ALLERGIES:  is allergic to promethazine hcl.  MEDICATIONS:  Current Outpatient Prescriptions  Medication Sig Dispense Refill  . ACTOS 45 MG tablet TAKE 1 TABLET BY MOUTH ONCE A DAY  90 tablet  2  . aspirin 81 MG tablet Take 81 mg by mouth daily.        Marland Kitchen atorvastatin (LIPITOR) 10 MG tablet TAKE 1 TABLET BY MOUTH AT BEDTIME  90 tablet  3  . glyBURIDE (DIABETA) 5 MG tablet TAKE 1 TABLET BY MOUTH TWICE DAILY  180 tablet  2  . metFORMIN (GLUCOPHAGE) 850 MG tablet TAKE 1 TABLET BY MOUTH 3 TIMES DAILY  270 tablet  2  . Multiple Vitamin (MULTIVITAMIN) tablet Take 1 tablet by mouth daily.        Marland Kitchen NIASPAN 500 MG CR tablet TAKE 3 TABLETS  BY MOUTH AT BEDTIME  270 tablet  2  . ramipril (ALTACE) 10 MG capsule TAKE 1 CAPSULE BY MOUTH DAILY  90 capsule  2  . bismuth-metronidazole-tetracycline (PYLERA) 140-125-125 MG per capsule Take 3 capsules qid until gone  120 capsule  0    SURGICAL HISTORY:  Past Surgical History  Procedure Date  . Knee surgery 09/04    lt knee fx repair ORIF  . Tibia fracture surgery     left 2012  . Flexible sigmoidoscopy 05/1988    internal hemorrhoids   ECOG PERFORMANCE STATUS: 0 - Asymptomatic  Blood pressure 178/70, pulse 63, temperature 98.1 F (36.7 C), temperature source Oral, height 6\' 2"  (1.88 m), weight 288 lb 8 oz (130.863 kg).  LABORATORY DATA: pending   RADIOGRAPHIC STUDIES:  No results found.  ASSESSMENT: 61 year old gentleman with:   #1 mild pancytopenia with him 3% plasmacytosis on his bone marrow biopsy and aspirate, Leading to to the diagnosis of monoclonal, but he of uncertain significance. Time patient remains asymptomatic and is doing quite well and may continue to do well for a very long time.  PLAN:   We will continue to do surveillance every 6 months. He had a CBC and a myeloma panel done today we will followup on that and we will call him with the results. He knows to call with any problems questions or  concerns in the meantime.  All questions were answered. The patient knows to call the clinic with any problems, questions or concerns. We can certainly see the patient much sooner if necessary.  I spent 25 minutes counseling the patient face to face. The total time spent in the appointment was 30 minutes.    Drue Second, MD Medical/Oncology [facility] [phone] (beeper) [phone] (Office)  08/20/2011, 10:23 AM

## 2011-08-20 NOTE — Telephone Encounter (Signed)
made patient appointment for 03-01-2012 starting at 9:00am printed out calendar and gave to the patient

## 2011-08-21 ENCOUNTER — Telehealth: Payer: Self-pay | Admitting: Medical Oncology

## 2011-08-21 NOTE — Telephone Encounter (Signed)
Informed patient that per Dr. Welton Flakes his lab work looks stable.  Patient expressed understanding.

## 2011-08-22 LAB — PROTEIN ELECTROPHORESIS, SERUM, WITH REFLEX
Albumin ELP: 60.7 % (ref 55.8–66.1)
Alpha-1-Globulin: 4.1 % (ref 2.9–4.9)
Alpha-2-Globulin: 10.4 % (ref 7.1–11.8)

## 2011-08-22 LAB — IGG, IGA, IGM
IgA: 197 mg/dL (ref 68–379)
IgM, Serum: 53 mg/dL (ref 41–251)

## 2011-09-04 ENCOUNTER — Other Ambulatory Visit: Payer: BC Managed Care – PPO | Admitting: Lab

## 2011-09-04 ENCOUNTER — Ambulatory Visit: Payer: BC Managed Care – PPO | Admitting: Oncology

## 2011-11-25 ENCOUNTER — Other Ambulatory Visit: Payer: Self-pay | Admitting: Family Medicine

## 2011-11-25 NOTE — Telephone Encounter (Signed)
Received refill request electronically from pharmacy. Last office visit 03/04/11 and was to follow-up with lab work and office visit 3 months later. No up coming appointments scheduled. Is it okay to refill medication?

## 2011-11-26 NOTE — Telephone Encounter (Signed)
rxs sent for 90 days, have pt schedule visit with labs ahead of time.

## 2011-11-27 ENCOUNTER — Other Ambulatory Visit: Payer: Self-pay | Admitting: Family Medicine

## 2011-11-27 NOTE — Telephone Encounter (Signed)
Patient's wife advised.  Call sent to [facility] for scheduling.

## 2012-01-27 ENCOUNTER — Other Ambulatory Visit (INDEPENDENT_AMBULATORY_CARE_PROVIDER_SITE_OTHER): Payer: BC Managed Care – PPO

## 2012-01-27 DIAGNOSIS — E119 Type 2 diabetes mellitus without complications: Secondary | ICD-10-CM

## 2012-02-03 ENCOUNTER — Encounter: Payer: Self-pay | Admitting: Family Medicine

## 2012-02-03 ENCOUNTER — Ambulatory Visit (INDEPENDENT_AMBULATORY_CARE_PROVIDER_SITE_OTHER): Payer: BC Managed Care – PPO | Admitting: Family Medicine

## 2012-02-03 VITALS — BP 134/60 | HR 75 | Temp 98.0°F | Wt 281.0 lb

## 2012-02-03 DIAGNOSIS — Z23 Encounter for immunization: Secondary | ICD-10-CM

## 2012-02-03 DIAGNOSIS — E1139 Type 2 diabetes mellitus with other diabetic ophthalmic complication: Secondary | ICD-10-CM

## 2012-02-03 DIAGNOSIS — E11329 Type 2 diabetes mellitus with mild nonproliferative diabetic retinopathy without macular edema: Secondary | ICD-10-CM

## 2012-02-03 DIAGNOSIS — E113299 Type 2 diabetes mellitus with mild nonproliferative diabetic retinopathy without macular edema, unspecified eye: Secondary | ICD-10-CM

## 2012-02-03 NOTE — Patient Instructions (Addendum)
Work on [employer] and weight in the meantime and recheck labs before a physical in 2/14.  Take care. Glad to see you.  Check with the clinic at [facility] and Dr. Welton Flakes about getting a shingles shot.  See if they are okay with you getting it.

## 2012-02-03 NOTE — Progress Notes (Signed)
Diabetes:  Using medications without difficulties:yes Hypoglycemic episodes: only with prolonged fasting Hyperglycemic episodes:no Feet problems:no Blood Sugars averaging: 118 on recent checked. Not checked daily.   eye exam within last year: has been followed by [facility].  Laser treatment for retinal hemorrhage recently.  Vision is improved now.  Limited exercise recently with work. Travelling for work.   He has trouble with weight this time of year.   A1c increased, discussed.   Seen by heme/onc for MGUS.  Has routine f/u scheduled.   PMH and SH reviewed  Meds, vitals, and allergies reviewed.   ROS: See HPI.  Otherwise negative.    GEN: nad, alert and oriented HEENT: mucous membranes moist NECK: supple w/o LA CV: rrr. PULM: ctab, no inc wob ABD: soft, +bs EXT: no edema SKIN: no acute rash  Diabetic foot exam: Normal inspection No skin breakdown No calluses  Normal DP pulses Normal sensation to light touch and monofilament Nails normal

## 2012-02-03 NOTE — Assessment & Plan Note (Signed)
He'll work on [employer]- he's started to adjust his meals.  Will exercise more.  We'll plan to recheck in 2014 at CPE.  He has f/u with [facility] re: his eyes. He agrees with plan.  No change in meds today.  We talked about prevention of further complications of DM2.  He understood.

## 2012-03-01 ENCOUNTER — Telehealth: Payer: Self-pay | Admitting: *Deleted

## 2012-03-01 ENCOUNTER — Encounter: Payer: Self-pay | Admitting: Oncology

## 2012-03-01 ENCOUNTER — Ambulatory Visit (HOSPITAL_BASED_OUTPATIENT_CLINIC_OR_DEPARTMENT_OTHER): Payer: BC Managed Care – PPO | Admitting: Oncology

## 2012-03-01 ENCOUNTER — Other Ambulatory Visit (HOSPITAL_BASED_OUTPATIENT_CLINIC_OR_DEPARTMENT_OTHER): Payer: BC Managed Care – PPO

## 2012-03-01 VITALS — BP 173/75 | HR 77 | Temp 98.3°F | Resp 20 | Ht 74.0 in | Wt 278.8 lb

## 2012-03-01 DIAGNOSIS — D649 Anemia, unspecified: Secondary | ICD-10-CM

## 2012-03-01 DIAGNOSIS — D472 Monoclonal gammopathy: Secondary | ICD-10-CM

## 2012-03-01 DIAGNOSIS — D509 Iron deficiency anemia, unspecified: Secondary | ICD-10-CM

## 2012-03-01 DIAGNOSIS — D61818 Other pancytopenia: Secondary | ICD-10-CM

## 2012-03-01 LAB — CBC WITH DIFFERENTIAL/PLATELET
BASO%: 0.2 % (ref 0.0–2.0)
Basophils Absolute: 0 10*3/uL (ref 0.0–0.1)
EOS%: 1.6 % (ref 0.0–7.0)
HGB: 12.1 g/dL — ABNORMAL LOW (ref 13.0–17.1)
MCH: 28.7 pg (ref 27.2–33.4)
MCHC: 33.5 g/dL (ref 32.0–36.0)
MCV: 85.7 fL (ref 79.3–98.0)
MONO%: 8.2 % (ref 0.0–14.0)
RDW: 14.5 % (ref 11.0–14.6)

## 2012-03-01 NOTE — Progress Notes (Signed)
OFFICE PROGRESS NOTE  CC  [name], MD [address] [place] [address]  DIAGNOSIS: 61 year old gentleman with MGUS  PRIOR THERAPY:  1. When a bone marrow biopsy and aspirate that revealed 3% plasma cells in the bone marrow. With monoclonal gammopathy of uncertain significance.(MGUS)  CURRENT THERAPY: Observation  INTERVAL HISTORY: Dennis Wise 61 y.o. male returns for followup visit today.overall hematologically patient is doing well. However she was noted to have quite a bit of visual disturbances due to his diabetes. He apparently is getting laser therapy at [facility]. He otherwise denies any fevers chills night sweats aches pains no nausea or vomiting no bruising or bleeding. No peripheral paresthesias. Remainder of the 10 point review of systems is negative.   MEDICAL HISTORY: Past Medical History  Diagnosis Date  . GERD (gastroesophageal reflux disease) 10/90  . Hypertension 08/01  . Diabetes mellitus 84/90(not sure what this means but was listed in centricity like this)    type II  . Hyperlipidemia   . Internal hemorrhoids   . Anemia     Iron deficiency part of this  . Pneumonia   . Pancytopenia   . MGUS (monoclonal gammopathy of unknown significance)     ALLERGIES:  is allergic to promethazine hcl.  MEDICATIONS:  Current Outpatient Prescriptions  Medication Sig Dispense Refill  . aspirin 81 MG tablet Take 81 mg by mouth daily.        Marland Kitchen atorvastatin (LIPITOR) 10 MG tablet TAKE 1 TABLET AT BEDTIME  90 tablet  1  . glyBURIDE (DIABETA) 5 MG tablet TAKE 1 TABLET TWICE DAILY  180 tablet  0  . metFORMIN (GLUCOPHAGE) 850 MG tablet TAKE 1 TABLET BY MOUTH 3 TIMES DAILY  270 tablet  0  . Multiple Vitamin (MULTIVITAMIN) tablet Take 1 tablet by mouth daily.        Marland Kitchen NIASPAN 500 MG CR tablet TAKE 3 TABLETS BY MOUTH AT BEDTIME  270 tablet  0  . pioglitazone (ACTOS) 45 MG tablet TAKE 1 TABLET BY MOUTH ONCE A DAY  90 tablet  0  . ramipril (ALTACE) 10 MG capsule  TAKE 1 CAPSULE DAILY  90 capsule  0  . bismuth-metronidazole-tetracycline (PYLERA) 140-125-125 MG per capsule Take 3 capsules qid until gone  120 capsule  0    SURGICAL HISTORY:  Past Surgical History  Procedure Date  . Knee surgery 09/04    lt knee fx repair ORIF  . Tibia fracture surgery     left 2012  . Flexible sigmoidoscopy 05/1988    internal hemorrhoids   ECOG PERFORMANCE STATUS: 0 - Asymptomatic  Blood pressure 173/75, pulse 77, temperature 98.3 F (36.8 C), temperature source Oral, resp. rate 20, height 6\' 2"  (1.88 m), weight 278 lb 12.8 oz (126.463 kg).  LABORATORY DATA: pending   RADIOGRAPHIC STUDIES:  No results found.  ASSESSMENT: 61 year old gentleman with:    #1 patient with MGUS on observation only.  #2 pancytopenia resolved he is mildly anemic but this may be due to his chronic disease of diabetes.  PLAN:  1 patient will continue to be observed for his MGUS and pancytopenia.  #2 patient will be seen back in 6 months time in followup. All questions were answered. The patient knows to call the clinic with any problems, questions or concerns. We can certainly see the patient much sooner if necessary.  I spent 15 minutes counseling the patient face to face. The total time spent in the appointment was 30 minutes.  Drue Second, MD Medical/Oncology [facility] [phone] (beeper) [phone] (Office)  03/01/2012, 9:49 AM

## 2012-03-01 NOTE — Telephone Encounter (Signed)
Gave patient appointment for 2014 

## 2012-03-01 NOTE — Patient Instructions (Addendum)
Doing well, labs look stable  We will continue to see you every 6 months

## 2012-03-03 LAB — KAPPA/LAMBDA LIGHT CHAINS
Kappa free light chain: 1.77 mg/dL (ref 0.33–1.94)
Lambda Free Lght Chn: 1.75 mg/dL (ref 0.57–2.63)

## 2012-03-03 LAB — SPEP & IFE WITH QIG
Alpha-2-Globulin: 9.6 % (ref 7.1–11.8)
Gamma Globulin: 12.5 % (ref 11.1–18.8)
IgM, Serum: 53 mg/dL (ref 41–251)
Total Protein, Serum Electrophoresis: 7.1 g/dL (ref 6.0–8.3)

## 2012-03-16 ENCOUNTER — Other Ambulatory Visit: Payer: Self-pay | Admitting: Family Medicine

## 2012-04-19 ENCOUNTER — Other Ambulatory Visit: Payer: Self-pay | Admitting: Family Medicine

## 2012-05-14 ENCOUNTER — Other Ambulatory Visit: Payer: Self-pay | Admitting: Family Medicine

## 2012-05-14 DIAGNOSIS — E119 Type 2 diabetes mellitus without complications: Secondary | ICD-10-CM

## 2012-05-24 ENCOUNTER — Other Ambulatory Visit (INDEPENDENT_AMBULATORY_CARE_PROVIDER_SITE_OTHER): Payer: BC Managed Care – PPO

## 2012-05-24 DIAGNOSIS — E119 Type 2 diabetes mellitus without complications: Secondary | ICD-10-CM

## 2012-05-24 LAB — HEMOGLOBIN A1C: Hgb A1c MFr Bld: 5.7 % (ref 4.6–6.5)

## 2012-05-24 LAB — COMPREHENSIVE METABOLIC PANEL
Alkaline Phosphatase: 84 U/L (ref 39–117)
Glucose, Bld: 64 mg/dL — ABNORMAL LOW (ref 70–99)
Sodium: 140 mEq/L (ref 135–145)
Total Bilirubin: 0.9 mg/dL (ref 0.3–1.2)
Total Protein: 6.5 g/dL (ref 6.0–8.3)

## 2012-05-24 LAB — LIPID PANEL
HDL: 75.4 mg/dL (ref >39.00)
Total CHOL/HDL Ratio: 2
VLDL: 14.2 mg/dL (ref 0.0–40.0)

## 2012-05-27 ENCOUNTER — Encounter: Payer: Self-pay | Admitting: Family Medicine

## 2012-05-27 ENCOUNTER — Ambulatory Visit (INDEPENDENT_AMBULATORY_CARE_PROVIDER_SITE_OTHER): Payer: BC Managed Care – PPO | Admitting: Family Medicine

## 2012-05-27 ENCOUNTER — Other Ambulatory Visit: Payer: Self-pay | Admitting: Family Medicine

## 2012-05-27 VITALS — BP 130/58 | HR 65 | Temp 98.5°F | Ht 74.0 in | Wt 261.0 lb

## 2012-05-27 DIAGNOSIS — E11329 Type 2 diabetes mellitus with mild nonproliferative diabetic retinopathy without macular edema: Secondary | ICD-10-CM

## 2012-05-27 DIAGNOSIS — I10 Essential (primary) hypertension: Secondary | ICD-10-CM

## 2012-05-27 DIAGNOSIS — E113299 Type 2 diabetes mellitus with mild nonproliferative diabetic retinopathy without macular edema, unspecified eye: Secondary | ICD-10-CM

## 2012-05-27 DIAGNOSIS — E7800 Pure hypercholesterolemia, unspecified: Secondary | ICD-10-CM

## 2012-05-27 DIAGNOSIS — E119 Type 2 diabetes mellitus without complications: Secondary | ICD-10-CM

## 2012-05-27 DIAGNOSIS — Z0000 Encounter for general adult medical examination without abnormal findings: Secondary | ICD-10-CM

## 2012-05-27 DIAGNOSIS — E1139 Type 2 diabetes mellitus with other diabetic ophthalmic complication: Secondary | ICD-10-CM

## 2012-05-27 DIAGNOSIS — Z125 Encounter for screening for malignant neoplasm of prostate: Secondary | ICD-10-CM

## 2012-05-27 NOTE — Patient Instructions (Addendum)
Check with your insurance to see if they will cover the shingles shot. Recheck labs in 3 months.  We'll contact you with your lab report. We should get together again in the fall of 2014.   Stop the glyburide.  If you continue to have low sugars, then cut the actos in half.

## 2012-05-27 NOTE — Progress Notes (Signed)
CPE- See plan.  Routine anticipatory guidance given to patient.  See health maintenance. Tetanus 2011 PNA 2012 Flu shot done fall 2013 Shingles shot.  He checked with Dr. Welton Flakes and it was reported that he could get it.  He'll check on coverage.  Colonoscopy done 2013 PSA d/w pt.  We elected to check on next lab visit.  D/w pt re pro/cons of checking PSA.  Living will.  Wife would be designated if incapacitated.    MGUS is followed per Dr. Welton Flakes.    Elevated Cholesterol: Using medications without problems: yes Muscle aches: no Diet compliance:yes Exercise:yes  Hypertension:    Using medication without problems or lightheadedness: yes Chest pain with exertion:no Edema:no Short of breath:no  Diabetes:  Using medications without difficulties: yes but will lows since losing weight intentionally Hypoglycemic episodes:yes Hyperglycemic episodes:no Feet problems:no Blood Sugars averaging: mult sugars <70 eye exam within last year: yes, at [facility].   PMH and SH reviewed  Meds, vitals, and allergies reviewed.   ROS: See HPI.  Otherwise negative.    GEN: nad, alert and oriented HEENT: mucous membranes moist NECK: supple w/o LA CV: rrr. PULM: ctab, no inc wob ABD: soft, +bs EXT: no edema SKIN: no acute rash  Diabetic foot exam: Normal inspection No skin breakdown No calluses  Normal DP pulses Normal sensation to light touch and monofilament Nails normal

## 2012-05-28 NOTE — Assessment & Plan Note (Signed)
Routine anticipatory guidance given to patient.  See health maintenance. Tetanus 2011 PNA 2012 Flu shot done fall 2013 Shingles shot.  He checked with Dr. Welton Flakes and it was reported that he could get it.  He'll check on coverage.  Colonoscopy done 2013 PSA d/w pt.  We elected to check on next lab visit.  D/w pt re pro/cons of checking PSA.  Living will.  Wife would be designated if incapacitated.

## 2012-05-28 NOTE — Assessment & Plan Note (Signed)
Controlled, continue current meds. Labs d/w pt.  

## 2012-05-28 NOTE — Assessment & Plan Note (Signed)
Now with lows since losing weight.  Will stop glipizide.  Can cut actos in half if needed.  He agrees.  Recheck A1c in 3 months.

## 2012-06-05 ENCOUNTER — Encounter: Payer: Self-pay | Admitting: Internal Medicine

## 2012-06-05 DIAGNOSIS — Z860100 Personal history of colon polyps, unspecified: Secondary | ICD-10-CM

## 2012-06-05 HISTORY — DX: Personal history of colon polyps, unspecified: Z86.0100

## 2012-06-05 HISTORY — DX: Personal history of colonic polyps: Z86.010

## 2012-06-07 ENCOUNTER — Encounter: Payer: Self-pay | Admitting: Internal Medicine

## 2012-06-16 ENCOUNTER — Encounter: Payer: Self-pay | Admitting: Family Medicine

## 2012-08-26 ENCOUNTER — Other Ambulatory Visit (INDEPENDENT_AMBULATORY_CARE_PROVIDER_SITE_OTHER): Payer: BC Managed Care – PPO

## 2012-08-26 DIAGNOSIS — E119 Type 2 diabetes mellitus without complications: Secondary | ICD-10-CM

## 2012-08-26 DIAGNOSIS — E11329 Type 2 diabetes mellitus with mild nonproliferative diabetic retinopathy without macular edema: Secondary | ICD-10-CM

## 2012-08-26 DIAGNOSIS — E1139 Type 2 diabetes mellitus with other diabetic ophthalmic complication: Secondary | ICD-10-CM

## 2012-08-26 DIAGNOSIS — Z125 Encounter for screening for malignant neoplasm of prostate: Secondary | ICD-10-CM

## 2012-08-26 DIAGNOSIS — E113299 Type 2 diabetes mellitus with mild nonproliferative diabetic retinopathy without macular edema, unspecified eye: Secondary | ICD-10-CM

## 2012-08-26 LAB — HEMOGLOBIN A1C: Hgb A1c MFr Bld: 6.4 % (ref 4.6–6.5)

## 2012-08-27 ENCOUNTER — Encounter: Payer: Self-pay | Admitting: *Deleted

## 2012-08-30 ENCOUNTER — Telehealth: Payer: Self-pay | Admitting: *Deleted

## 2012-08-30 ENCOUNTER — Ambulatory Visit (HOSPITAL_BASED_OUTPATIENT_CLINIC_OR_DEPARTMENT_OTHER): Payer: BC Managed Care – PPO | Admitting: Oncology

## 2012-08-30 ENCOUNTER — Encounter: Payer: Self-pay | Admitting: Oncology

## 2012-08-30 ENCOUNTER — Other Ambulatory Visit (HOSPITAL_BASED_OUTPATIENT_CLINIC_OR_DEPARTMENT_OTHER): Payer: BC Managed Care – PPO | Admitting: Lab

## 2012-08-30 VITALS — BP 161/70 | HR 61 | Temp 97.9°F | Resp 20 | Ht 74.0 in | Wt 256.7 lb

## 2012-08-30 DIAGNOSIS — D509 Iron deficiency anemia, unspecified: Secondary | ICD-10-CM

## 2012-08-30 DIAGNOSIS — D61818 Other pancytopenia: Secondary | ICD-10-CM

## 2012-08-30 DIAGNOSIS — D472 Monoclonal gammopathy: Secondary | ICD-10-CM

## 2012-08-30 DIAGNOSIS — E119 Type 2 diabetes mellitus without complications: Secondary | ICD-10-CM

## 2012-08-30 LAB — COMPREHENSIVE METABOLIC PANEL (CC13)
ALT: 40 U/L (ref 0–55)
AST: 32 U/L (ref 5–34)
Alkaline Phosphatase: 133 U/L (ref 40–150)
Creatinine: 1.1 mg/dL (ref 0.7–1.3)
Sodium: 139 mEq/L (ref 136–145)
Total Bilirubin: 0.98 mg/dL (ref 0.20–1.20)
Total Protein: 6.7 g/dL (ref 6.4–8.3)

## 2012-08-30 LAB — CBC WITH DIFFERENTIAL/PLATELET
BASO%: 0.6 % (ref 0.0–2.0)
EOS%: 3.3 % (ref 0.0–7.0)
LYMPH%: 20.5 % (ref 14.0–49.0)
MCH: 29.6 pg (ref 27.2–33.4)
MCHC: 33.3 g/dL (ref 32.0–36.0)
MONO#: 0.3 10*3/uL (ref 0.1–0.9)
Platelets: 96 10*3/uL — ABNORMAL LOW (ref 140–400)
RBC: 4.01 10*6/uL — ABNORMAL LOW (ref 4.20–5.82)
WBC: 4 10*3/uL (ref 4.0–10.3)
lymph#: 0.8 10*3/uL — ABNORMAL LOW (ref 0.9–3.3)

## 2012-08-30 NOTE — Progress Notes (Signed)
OFFICE PROGRESS NOTE  CC  [name], MD [address] [place] [address]  DIAGNOSIS: 62 year old gentleman with MGUS  PRIOR THERAPY:  1. When a bone marrow biopsy and aspirate that revealed 3% plasma cells in the bone marrow. With monoclonal gammopathy of uncertain significance.(MGUS)  CURRENT THERAPY: Observation  INTERVAL HISTORY: MARVEN VELEY 62 y.o. male returns for followup visit today.overall hematologically patient is doing well. However she was noted to have quite a bit of visual disturbances due to his diabetes. Marland Kitchen He otherwise denies any fevers chills night sweats aches pains no nausea or vomiting no bruising or bleeding. No peripheral paresthesias. Remainder of the 10 point review of systems is negative.   MEDICAL HISTORY: Past Medical History  Diagnosis Date  . GERD (gastroesophageal reflux disease) 10/90  . Hypertension 08/01  . Hyperlipidemia   . Internal hemorrhoids   . Anemia     Iron deficiency part of this  . Pneumonia   . Pancytopenia   . MGUS (monoclonal gammopathy of unknown significance)   . Diabetes mellitus     type II  . Personal history of colonic adenomas 06/05/2012    ALLERGIES:  is allergic to promethazine hcl.  MEDICATIONS:  Current Outpatient Prescriptions  Medication Sig Dispense Refill  . aspirin 81 MG tablet Take 81 mg by mouth daily.        Marland Kitchen atorvastatin (LIPITOR) 10 MG tablet TAKE 1 TABLET AT BEDTIME  90 tablet  3  . glyBURIDE (DIABETA) 2.5 MG tablet Take 2.5 mg by mouth at bedtime.      . metFORMIN (GLUCOPHAGE) 850 MG tablet TAKE 1 TABLET 3 TIMES DAILY  270 tablet  3  . Multiple Vitamin (MULTIVITAMIN) tablet Take 1 tablet by mouth daily.        . niacin (NIASPAN) 500 MG CR tablet TAKE 3 TABLETS BY MOUTH AT BEDTIME  270 tablet  3  . pioglitazone (ACTOS) 45 MG tablet TAKE 1 TABLET DAILY  90 tablet  2  . ramipril (ALTACE) 10 MG capsule TAKE 1 CAPSULE DAILY  90 capsule  3   No current facility-administered medications  for this visit.    SURGICAL HISTORY:  Past Surgical History  Procedure Laterality Date  . Knee surgery  09/04    lt knee fx repair ORIF  . Tibia fracture surgery      left 2012  . Flexible sigmoidoscopy  05/1988    internal hemorrhoids   ECOG PERFORMANCE STATUS: 0 - Asymptomatic  Blood pressure 161/70, pulse 61, temperature 97.9 F (36.6 C), temperature source Oral, resp. rate 20, height 6\' 2"  (1.88 m), weight 256 lb 11.2 oz (116.438 kg).  LABORATORY DATA: Lab Results  Component Value Date   WBC 4.0 08/30/2012   HGB 11.9* 08/30/2012   HCT 35.7* 08/30/2012   MCV 88.9 08/30/2012   PLT 96* 08/30/2012      RADIOGRAPHIC STUDIES:  No results found.  ASSESSMENT: 62 year old gentleman with:    #1 patient with MGUS on observation only.  #2 pancytopenia: platelets are lower than previously  PLAN:  1 patient will continue to be observed for his MGUS and pancytopenia.  #2 patient will be seen back in 4 months time in followup.  All questions were answered. The patient knows to call the clinic with any problems, questions or concerns. We can certainly see the patient much sooner if necessary.  I spent 15 minutes counseling the patient face to face. The total time spent in the appointment was  30 minutes.    Drue Second, MD Medical/Oncology [facility] [phone] (beeper) [phone] (Office)  08/30/2012, 9:48 AM

## 2012-08-30 NOTE — Patient Instructions (Addendum)
Doing well, although your platelets are slightly lower than on previous labs.   We will monitor you closely  I will see you back in 4 months

## 2012-08-30 NOTE — Telephone Encounter (Signed)
appts made and printed...td 

## 2012-09-01 LAB — SPEP & IFE WITH QIG
Alpha-2-Globulin: 10.2 % (ref 7.1–11.8)
Beta 2: 5 % (ref 3.2–6.5)
Gamma Globulin: 13.2 % (ref 11.1–18.8)
IgG (Immunoglobin G), Serum: 919 mg/dL (ref 650–1600)

## 2012-09-01 LAB — KAPPA/LAMBDA LIGHT CHAINS: Kappa free light chain: 1.72 mg/dL (ref 0.33–1.94)

## 2012-09-01 LAB — BETA 2 MICROGLOBULIN, SERUM: Beta-2 Microglobulin: 1.81 mg/L — ABNORMAL HIGH (ref 1.01–1.73)

## 2012-11-24 ENCOUNTER — Other Ambulatory Visit: Payer: Self-pay | Admitting: Family Medicine

## 2012-11-24 DIAGNOSIS — E119 Type 2 diabetes mellitus without complications: Secondary | ICD-10-CM

## 2012-11-25 ENCOUNTER — Other Ambulatory Visit: Payer: BC Managed Care – PPO

## 2012-11-30 ENCOUNTER — Other Ambulatory Visit (INDEPENDENT_AMBULATORY_CARE_PROVIDER_SITE_OTHER): Payer: BC Managed Care – PPO

## 2012-11-30 ENCOUNTER — Ambulatory Visit: Payer: BC Managed Care – PPO | Admitting: Family Medicine

## 2012-11-30 DIAGNOSIS — D509 Iron deficiency anemia, unspecified: Secondary | ICD-10-CM

## 2012-11-30 DIAGNOSIS — E11329 Type 2 diabetes mellitus with mild nonproliferative diabetic retinopathy without macular edema: Secondary | ICD-10-CM

## 2012-11-30 DIAGNOSIS — E7800 Pure hypercholesterolemia, unspecified: Secondary | ICD-10-CM

## 2012-11-30 DIAGNOSIS — E1139 Type 2 diabetes mellitus with other diabetic ophthalmic complication: Secondary | ICD-10-CM

## 2012-11-30 DIAGNOSIS — E119 Type 2 diabetes mellitus without complications: Secondary | ICD-10-CM

## 2012-11-30 DIAGNOSIS — D61818 Other pancytopenia: Secondary | ICD-10-CM

## 2012-11-30 DIAGNOSIS — E113299 Type 2 diabetes mellitus with mild nonproliferative diabetic retinopathy without macular edema, unspecified eye: Secondary | ICD-10-CM

## 2012-11-30 LAB — LIPID PANEL
HDL: 76.8 mg/dL (ref >39.00)
LDL Cholesterol: 54 mg/dL (ref 0–99)
Total CHOL/HDL Ratio: 2
Triglycerides: 83 mg/dL (ref 0.0–149.0)

## 2012-12-02 ENCOUNTER — Encounter: Payer: Self-pay | Admitting: *Deleted

## 2012-12-02 ENCOUNTER — Encounter: Payer: Self-pay | Admitting: Family Medicine

## 2012-12-02 ENCOUNTER — Ambulatory Visit (INDEPENDENT_AMBULATORY_CARE_PROVIDER_SITE_OTHER): Payer: BC Managed Care – PPO | Admitting: Family Medicine

## 2012-12-02 VITALS — BP 130/62 | HR 62 | Temp 97.8°F | Wt 268.0 lb

## 2012-12-02 DIAGNOSIS — E7800 Pure hypercholesterolemia, unspecified: Secondary | ICD-10-CM

## 2012-12-02 DIAGNOSIS — E11329 Type 2 diabetes mellitus with mild nonproliferative diabetic retinopathy without macular edema: Secondary | ICD-10-CM

## 2012-12-02 DIAGNOSIS — E119 Type 2 diabetes mellitus without complications: Secondary | ICD-10-CM

## 2012-12-02 DIAGNOSIS — E1139 Type 2 diabetes mellitus with other diabetic ophthalmic complication: Secondary | ICD-10-CM

## 2012-12-02 DIAGNOSIS — E113299 Type 2 diabetes mellitus with mild nonproliferative diabetic retinopathy without macular edema, unspecified eye: Secondary | ICD-10-CM

## 2012-12-02 MED ORDER — GLYBURIDE 2.5 MG PO TABS: 2.5000 mg | ORAL_TABLET | Freq: Every day | ORAL | Status: DC

## 2012-12-02 NOTE — Patient Instructions (Addendum)
Recheck in 3 months, labs ahead of time.  Try to work on your sugar.   Take care.

## 2012-12-02 NOTE — Progress Notes (Signed)
Diabetes:  Using medications without difficulties:yes Hypoglycemic episodes: rare Hyperglycemic episodes:rare Feet problems: at baseline Blood Sugars averaging: ~120s eye exam within last year: 11/05/12 at [facility] for retinopathy Travel continues to be the issue for the patient.  Diet is altered on the road with work A1c discussed.    Elevated Cholesterol: Using medications without problems:yes Muscle aches: no Diet compliance: discussed Exercise:discussed  PMH and SH reviewed  Meds, vitals, and allergies reviewed.   ROS: See HPI.  Otherwise negative.    GEN: nad, alert and oriented HEENT: mucous membranes moist NECK: supple w/o LA CV: rrr. PULM: ctab, no inc wob ABD: soft, +bs EXT: no edema SKIN: no acute rash  Diabetic foot exam: Normal inspection No skin breakdown No calluses  Normal DP pulses Slight dec in sensation to light touch and monofilament Nails normal

## 2012-12-03 NOTE — Assessment & Plan Note (Signed)
Controlled, discussed.

## 2012-12-03 NOTE — Assessment & Plan Note (Addendum)
He'll work on diet and continue as is with meds. Recheck in about 3 months. Labs discussed.

## 2012-12-05 ENCOUNTER — Other Ambulatory Visit: Payer: Self-pay | Admitting: Family Medicine

## 2012-12-31 ENCOUNTER — Encounter: Payer: Self-pay | Admitting: Internal Medicine

## 2013-01-05 ENCOUNTER — Encounter: Payer: Self-pay | Admitting: Internal Medicine

## 2013-01-12 ENCOUNTER — Telehealth: Payer: Self-pay | Admitting: *Deleted

## 2013-01-12 ENCOUNTER — Other Ambulatory Visit (HOSPITAL_BASED_OUTPATIENT_CLINIC_OR_DEPARTMENT_OTHER): Payer: BC Managed Care – PPO | Admitting: Lab

## 2013-01-12 ENCOUNTER — Ambulatory Visit (HOSPITAL_BASED_OUTPATIENT_CLINIC_OR_DEPARTMENT_OTHER): Payer: BC Managed Care – PPO | Admitting: Oncology

## 2013-01-12 ENCOUNTER — Encounter: Payer: Self-pay | Admitting: Oncology

## 2013-01-12 VITALS — BP 172/74 | HR 59 | Temp 97.8°F | Resp 20 | Ht 74.0 in | Wt 264.7 lb

## 2013-01-12 DIAGNOSIS — D61818 Other pancytopenia: Secondary | ICD-10-CM

## 2013-01-12 DIAGNOSIS — D509 Iron deficiency anemia, unspecified: Secondary | ICD-10-CM

## 2013-01-12 LAB — CBC WITH DIFFERENTIAL/PLATELET
BASO%: 0.8 % (ref 0.0–2.0)
EOS%: 5.3 % (ref 0.0–7.0)
HGB: 12.7 g/dL — ABNORMAL LOW (ref 13.0–17.1)
MCH: 29.6 pg (ref 27.2–33.4)
MCHC: 34.4 g/dL (ref 32.0–36.0)
RBC: 4.3 10*6/uL (ref 4.20–5.82)
RDW: 13.8 % (ref 11.0–14.6)
lymph#: 1.2 10*3/uL (ref 0.9–3.3)

## 2013-01-12 NOTE — Patient Instructions (Signed)
Doing well  I will see you back in 6 months 

## 2013-01-12 NOTE — Telephone Encounter (Signed)
appts made and printed...td 

## 2013-01-12 NOTE — Progress Notes (Signed)
OFFICE PROGRESS NOTE  CC  [name], MD [address] [place] [address]  DIAGNOSIS: 62 year old gentleman with MGUS  PRIOR THERAPY:  1. When a bone marrow biopsy and aspirate that revealed 3% plasma cells in the bone marrow. With monoclonal gammopathy of uncertain significance.(MGUS)  CURRENT THERAPY: Observation  INTERVAL HISTORY: DAUNDRE BIEL 62 y.o. male returns for followup visit today.overall hematologically patient is doing well. However she was noted to have quite a bit of visual disturbances due to his diabetes. Marland Kitchen He otherwise denies any fevers chills night sweats aches pains no nausea or vomiting no bruising or bleeding. No peripheral paresthesias. Remainder of the 10 point review of systems is negative.   MEDICAL HISTORY: Past Medical History  Diagnosis Date  . GERD (gastroesophageal reflux disease) 10/90  . Hypertension 08/01  . Hyperlipidemia   . Internal hemorrhoids   . Anemia     Iron deficiency part of this  . Pneumonia   . Pancytopenia   . MGUS (monoclonal gammopathy of unknown significance)   . Diabetes mellitus     type II  . Personal history of colonic adenomas 06/05/2012    ALLERGIES:  is allergic to promethazine hcl.  MEDICATIONS:  Current Outpatient Prescriptions  Medication Sig Dispense Refill  . aspirin 81 MG tablet Take 81 mg by mouth daily.        Marland Kitchen atorvastatin (LIPITOR) 10 MG tablet TAKE 1 TABLET AT BEDTIME  90 tablet  3  . glyBURIDE (DIABETA) 2.5 MG tablet Take 1 tablet (2.5 mg total) by mouth at bedtime.  90 tablet  3  . metFORMIN (GLUCOPHAGE) 850 MG tablet       . Multiple Vitamin (MULTIVITAMIN) tablet Take 1 tablet by mouth daily.        . niacin (NIASPAN) 500 MG CR tablet TAKE 3 TABLETS BY MOUTH AT BEDTIME  270 tablet  3  . pioglitazone (ACTOS) 45 MG tablet TAKE 1 TABLET DAILY  90 tablet  1  . ramipril (ALTACE) 10 MG capsule TAKE 1 CAPSULE DAILY  90 capsule  3   No current facility-administered medications for this  visit.    SURGICAL HISTORY:  Past Surgical History  Procedure Laterality Date  . Knee surgery  09/04    lt knee fx repair ORIF  . Tibia fracture surgery      left 2012  . Flexible sigmoidoscopy  05/1988    internal hemorrhoids   ECOG PERFORMANCE STATUS: 0 - Asymptomatic  Blood pressure 172/74, pulse 59, temperature 97.8 F (36.6 C), temperature source Oral, resp. rate 20, height 6\' 2"  (1.88 m), weight 264 lb 11.2 oz (120.067 kg).  LABORATORY DATA: Lab Results  Component Value Date   WBC 3.8* 01/12/2013   HGB 12.7* 01/12/2013   HCT 37.0* 01/12/2013   MCV 86.0 01/12/2013   PLT 124* 01/12/2013      RADIOGRAPHIC STUDIES:  No results found.  ASSESSMENT: 62 year old gentleman with:    #1 patient with MGUS on observation only.  #2 pancytopenia: platelets are lower than previously  PLAN:  1 patient will continue to be observed for his MGUS and pancytopenia.  #2 patient will be seen back in 4 months time in followup.  All questions were answered. The patient knows to call the clinic with any problems, questions or concerns. We can certainly see the patient much sooner if necessary.  I spent 15 minutes counseling the patient face to face. The total time spent in the appointment was 15 minutes.  Drue Second, MD Medical/Oncology [facility] [phone] (beeper) [phone] (Office)  01/12/2013, 10:28 AM

## 2013-01-14 LAB — UIFE/LIGHT CHAINS/TP QN, 24-HR UR
Albumin, U: DETECTED
Free Kappa/Lambda Ratio: 15.33 ratio — ABNORMAL HIGH (ref 2.04–10.37)
Free Lambda Excretion/Day: 0.93 mg/d
Free Lambda Lt Chains,Ur: 0.03 mg/dL (ref 0.02–0.67)
Time: 24 hours
Total Protein, Urine-Ur/day: 90 mg/d (ref 10–140)
Total Protein, Urine: 2.9 mg/dL
Volume, Urine: 3100 mL

## 2013-02-15 ENCOUNTER — Other Ambulatory Visit: Payer: Self-pay | Admitting: Family Medicine

## 2013-02-16 ENCOUNTER — Other Ambulatory Visit: Payer: Self-pay | Admitting: Family Medicine

## 2013-02-21 ENCOUNTER — Other Ambulatory Visit (INDEPENDENT_AMBULATORY_CARE_PROVIDER_SITE_OTHER): Payer: BC Managed Care – PPO

## 2013-02-21 DIAGNOSIS — E119 Type 2 diabetes mellitus without complications: Secondary | ICD-10-CM

## 2013-02-24 ENCOUNTER — Other Ambulatory Visit: Payer: Self-pay

## 2013-02-28 ENCOUNTER — Encounter: Payer: Self-pay | Admitting: Family Medicine

## 2013-02-28 ENCOUNTER — Ambulatory Visit (INDEPENDENT_AMBULATORY_CARE_PROVIDER_SITE_OTHER): Payer: BC Managed Care – PPO | Admitting: Family Medicine

## 2013-02-28 VITALS — BP 140/64 | HR 70 | Temp 98.2°F | Wt 275.2 lb

## 2013-02-28 DIAGNOSIS — Z23 Encounter for immunization: Secondary | ICD-10-CM

## 2013-02-28 DIAGNOSIS — Z2911 Encounter for prophylactic immunotherapy for respiratory syncytial virus (RSV): Secondary | ICD-10-CM

## 2013-02-28 DIAGNOSIS — E11329 Type 2 diabetes mellitus with mild nonproliferative diabetic retinopathy without macular edema: Secondary | ICD-10-CM

## 2013-02-28 DIAGNOSIS — E113299 Type 2 diabetes mellitus with mild nonproliferative diabetic retinopathy without macular edema, unspecified eye: Secondary | ICD-10-CM

## 2013-02-28 DIAGNOSIS — E1139 Type 2 diabetes mellitus with other diabetic ophthalmic complication: Secondary | ICD-10-CM

## 2013-02-28 NOTE — Progress Notes (Signed)
Pre-visit discussion using our clinic review tool. No additional management support is needed unless otherwise documented below in the visit note.  Diabetes: Using medications without difficulties:yes  Hypoglycemic episodes: very rare  Hyperglycemic episodes: very rare  Feet problems: at baseline  Blood Sugars averaging: ~100-160s  eye exam within last year: 3 weeks ago at [facility] for retinopathy (02/08/13) Travel continues to be the issue for the patient Diet is altered on the road with work. A1c discussed. This is some better than prev  He has had his hours cut from work.  This has been tough for him.   Meds, vitals, and allergies reviewed.   ROS: See HPI. Otherwise negative.   GEN: nad, alert and oriented  HEENT: mucous membranes moist  NECK: supple w/o LA  CV: rrr.  PULM: ctab, no inc wob  ABD: soft, +bs  EXT: no edema  SKIN: no acute rash   Diabetic foot exam:  Normal inspection  No skin breakdown  No calluses  Normal DP pulses  Slight dec in sensation to light touch and monofilament  Nails normal

## 2013-02-28 NOTE — Patient Instructions (Signed)
Schedule a physical for spring of 2015.   Take care.  Glad to see you.

## 2013-03-01 NOTE — Assessment & Plan Note (Signed)
Eye clinic f/u pending.  He'll continue work on weight and diet.  Travel continues to be the main issue here.  Labs d/w.  Recheck spring 2015.

## 2013-03-21 ENCOUNTER — Ambulatory Visit (AMBULATORY_SURGERY_CENTER): Payer: BC Managed Care – PPO | Admitting: *Deleted

## 2013-03-21 VITALS — Ht 74.0 in | Wt 273.6 lb

## 2013-03-21 DIAGNOSIS — Z86010 Personal history of colonic polyps: Secondary | ICD-10-CM

## 2013-03-21 MED ORDER — NA SULFATE-K SULFATE-MG SULF 17.5-3.13-1.6 GM/177ML PO SOLN: 1.0000 | Freq: Once | ORAL | Status: DC

## 2013-03-21 NOTE — Progress Notes (Signed)
Denies allergies to eggs or soy products. Denies complications with sedation or anesthesia. 

## 2013-03-23 ENCOUNTER — Encounter: Payer: Self-pay | Admitting: Internal Medicine

## 2013-04-05 ENCOUNTER — Other Ambulatory Visit: Payer: Self-pay | Admitting: Family Medicine

## 2013-04-07 ENCOUNTER — Encounter: Payer: Self-pay | Admitting: Internal Medicine

## 2013-04-07 ENCOUNTER — Ambulatory Visit (AMBULATORY_SURGERY_CENTER): Payer: BC Managed Care – PPO | Admitting: Internal Medicine

## 2013-04-07 VITALS — BP 104/46 | HR 68 | Temp 97.5°F | Resp 18 | Ht 74.0 in | Wt 273.0 lb

## 2013-04-07 DIAGNOSIS — Z86010 Personal history of colonic polyps: Secondary | ICD-10-CM

## 2013-04-07 MED ORDER — SODIUM CHLORIDE 0.9 % IV SOLN: 500.0000 mL | INTRAVENOUS | Status: DC

## 2013-04-07 NOTE — Progress Notes (Signed)
Lidocaine-40mg IV prior to Propofol InductionPropofol given over incremental dosages 

## 2013-04-07 NOTE — Progress Notes (Signed)
No egg or soy allergy. ewm No problems with past sedation. ewm 

## 2013-04-07 NOTE — Patient Instructions (Addendum)
No polyps this time. Next colonoscopy (routine) in 5 years (2019).  I appreciate the opportunity to care for you. Iva Boop, MD, FACG      YOU HAD AN ENDOSCOPIC PROCEDURE TODAY AT THE [facility]: Refer to the procedure report that was given to you for any specific questions about what was found during the examination.  If the procedure report does not answer your questions, please call your gastroenterologist to clarify.  If you requested that your care partner not be given the details of your procedure findings, then the procedure report has been included in a sealed envelope for you to review at your convenience later.  YOU SHOULD EXPECT: Some feelings of bloating in the abdomen. Passage of more gas than usual.  Walking can help get rid of the air that was put into your GI tract during the procedure and reduce the bloating. If you had a lower endoscopy (such as a colonoscopy or flexible sigmoidoscopy) you may notice spotting of blood in your stool or on the toilet paper. If you underwent a bowel prep for your procedure, then you may not have a normal bowel movement for a few days.  DIET: Your first meal following the procedure should be a light meal and then it is ok to progress to your normal diet.  A half-sandwich or bowl of soup is an example of a good first meal.  Heavy or fried foods are harder to digest and may make you feel nauseous or bloated.  Likewise meals heavy in dairy and vegetables can cause extra gas to form and this can also increase the bloating.  Drink plenty of fluids but you should avoid alcoholic beverages for 24 hours.  ACTIVITY: Your care partner should take you home directly after the procedure.  You should plan to take it easy, moving slowly for the rest of the day.  You can resume normal activity the day after the procedure however you should NOT DRIVE or use heavy machinery for 24 hours (because of the sedation medicines used during the test).     SYMPTOMS TO REPORT IMMEDIATELY: A gastroenterologist can be reached at any hour.  During normal business hours, 8:30 AM to 5:00 PM Monday through Friday, call [phone].  After hours and on weekends, please call the GI answering service at [phone] who will take a message and have the physician on call contact you.   Following lower endoscopy (colonoscopy or flexible sigmoidoscopy):  Excessive amounts of blood in the stool  Significant tenderness or worsening of abdominal pains  Swelling of the abdomen that is new, acute  Fever of 100F or higher  FOLLOW UP: If any biopsies were taken you will be contacted by phone or by letter within the next 1-3 weeks.  Call your gastroenterologist if you have not heard about the biopsies in 3 weeks.  Our staff will call the home number listed on your records the next business day following your procedure to check on you and address any questions or concerns that you may have at that time regarding the information given to you following your procedure. This is a courtesy call and so if there is no answer at the home number and we have not heard from you through the emergency physician on call, we will assume that you have returned to your regular daily activities without incident.  SIGNATURES/CONFIDENTIALITY: You and/or your care partner have signed paperwork which will be entered into your electronic medical record.  These signatures attest to the fact that that the information above on your After Visit Summary has been reviewed and is understood.  Full responsibility of the confidentiality of this discharge information lies with you and/or your care-partner.

## 2013-04-07 NOTE — Op Note (Signed)
[facility] [address] [place], [address]   COLONOSCOPY PROCEDURE REPORT  PATIENT: Dennis, Wise  MR#: [id number] BIRTHDATE: 03-Jan-1951 , 62  yrs. old GENDER: Male ENDOSCOPIST: Iva Boop, MD, [facility] PROCEDURE DATE:  04/07/2013 PROCEDURE:   Colonoscopy, surveillance First Screening Colonoscopy - Avg.  risk and is 50 yrs.  old or older - No.  Prior Negative Screening - Now for repeat screening. N/A  History of Adenoma - Now for follow-up colonoscopy & has been > or = to 3 yrs.  No.  It has been less than 3 yrs since last colonoscopy.  Medical reason.  Polyps Removed Today? No.  Recommend repeat exam, <10 yrs? Yes.  High risk (family or personal hx). ASA CLASS:   Class II INDICATIONS:Patient's personal history of adenomatous colon polyps. 12 in  2013 MEDICATIONS: propofol (Diprivan) 300mg  IV, MAC sedation, administered by CRNA, and These medications were titrated to patient response per physician's verbal order  DESCRIPTION OF PROCEDURE:   After the risks benefits and alternatives of the procedure were thoroughly explained, informed consent was obtained.  A digital rectal exam revealed no abnormalities of the rectum, A digital rectal exam revealed no prostatic nodules, and A digital rectal exam revealed the prostate was not enlarged.   The [id number]  endoscope was introduced through the anus and advanced to the cecum, which was identified by both the appendix and ileocecal valve. No adverse events experienced.   The quality of the prep was excellent using Suprep  The instrument was then slowly withdrawn as the colon was fully examined.      COLON FINDINGS: A normal appearing cecum, ileocecal valve, and appendiceal orifice were identified.  The ascending, hepatic flexure, transverse, splenic flexure, descending, sigmoid colon and rectum appeared unremarkable.  No polyps or cancers were seen. Retroflexed views revealed no abnormalities. The  time to cecum=2 minutes 30 seconds.  Withdrawal time=9 minutes 24 seconds.  The scope was withdrawn and the procedure completed. COMPLICATIONS: There were no complications.  ENDOSCOPIC IMPRESSION: Normal colonoscopy - excellent prep  RECOMMENDATIONS: Repeat Colonoscopy in 5 years in patient with multiple adenomas 2013.   eSigned:  Iva Boop, MD, [facility] 04/07/2013 9:49 AM   cc: The Patient

## 2013-04-08 ENCOUNTER — Telehealth: Payer: Self-pay

## 2013-04-08 NOTE — Telephone Encounter (Signed)
  Follow up Call-  Call back number 04/07/2013 06/05/2011  Post procedure Call Back phone  # [phone]  Permission to leave phone message Yes Yes     Patient questions:  Do you have a fever, pain , or abdominal swelling? no Pain Score  0 *  Have you tolerated food without any problems? yes  Have you been able to return to your normal activities? yes  Do you have any questions about your discharge instructions: Diet   no Medications  no Follow up visit  no  Do you have questions or concerns about your Care? no  Actions: * If pain score is 4 or above: No action needed, pain <4.

## 2013-06-04 ENCOUNTER — Other Ambulatory Visit: Payer: Self-pay | Admitting: Family Medicine

## 2013-06-15 ENCOUNTER — Other Ambulatory Visit: Payer: Self-pay | Admitting: Family Medicine

## 2013-07-11 ENCOUNTER — Encounter: Payer: Self-pay | Admitting: Oncology

## 2013-07-11 ENCOUNTER — Telehealth: Payer: Self-pay | Admitting: Oncology

## 2013-07-11 ENCOUNTER — Other Ambulatory Visit (HOSPITAL_BASED_OUTPATIENT_CLINIC_OR_DEPARTMENT_OTHER): Payer: Commercial Managed Care - HMO

## 2013-07-11 ENCOUNTER — Ambulatory Visit (HOSPITAL_BASED_OUTPATIENT_CLINIC_OR_DEPARTMENT_OTHER): Payer: Commercial Managed Care - HMO | Admitting: Oncology

## 2013-07-11 VITALS — BP 185/79 | HR 63 | Temp 98.0°F | Resp 18 | Ht 74.0 in | Wt 279.5 lb

## 2013-07-11 DIAGNOSIS — D696 Thrombocytopenia, unspecified: Secondary | ICD-10-CM

## 2013-07-11 DIAGNOSIS — D472 Monoclonal gammopathy: Secondary | ICD-10-CM

## 2013-07-11 DIAGNOSIS — D61818 Other pancytopenia: Secondary | ICD-10-CM

## 2013-07-11 DIAGNOSIS — D509 Iron deficiency anemia, unspecified: Secondary | ICD-10-CM

## 2013-07-11 LAB — CBC WITH DIFFERENTIAL/PLATELET
BASO%: 0.5 % (ref 0.0–2.0)
Basophils Absolute: 0 10*3/uL (ref 0.0–0.1)
EOS%: 3 % (ref 0.0–7.0)
Eosinophils Absolute: 0.1 10*3/uL (ref 0.0–0.5)
HEMATOCRIT: 39.1 % (ref 38.4–49.9)
HEMOGLOBIN: 13.1 g/dL (ref 13.0–17.1)
LYMPH%: 27.5 % (ref 14.0–49.0)
MCH: 29 pg (ref 27.2–33.4)
MCHC: 33.5 g/dL (ref 32.0–36.0)
MCV: 86.5 fL (ref 79.3–98.0)
MONO#: 0.4 10*3/uL (ref 0.1–0.9)
MONO%: 8.9 % (ref 0.0–14.0)
NEUT#: 2.6 10*3/uL (ref 1.5–6.5)
NEUT%: 60.1 % (ref 39.0–75.0)
Platelets: 105 10*3/uL — ABNORMAL LOW (ref 140–400)
RBC: 4.52 10*6/uL (ref 4.20–5.82)
RDW: 13.6 % (ref 11.0–14.6)
WBC: 4.3 10*3/uL (ref 4.0–10.3)
lymph#: 1.2 10*3/uL (ref 0.9–3.3)

## 2013-07-11 LAB — FERRITIN CHCC: FERRITIN: 39 ng/mL (ref 22–316)

## 2013-07-11 LAB — IRON AND TIBC CHCC
%SAT: 33 % (ref 20–55)
IRON: 112 ug/dL (ref 42–163)
TIBC: 336 ug/dL (ref 202–409)
UIBC: 224 ug/dL (ref 117–376)

## 2013-07-11 NOTE — Patient Instructions (Signed)
Thrombocytopenia Thrombocytopenia is a condition in which there is an abnormally small number of platelets in your blood. Platelets are also called thrombocytes. Platelets are needed for blood clotting. CAUSES Thrombocytopenia is caused by:   Decreased production of platelets. This can be caused by:  Aplastic anemia in which your bone marrow quits making blood cells.  Cancer in the bone marrow.  Use of certain medicines, including chemotherapy.  Infection in the bone marrow.  Heavy alcohol consumption.  Increased destruction of platelets. This can be caused by:  Certain immune diseases.  Use of certain drugs.  Certain blood clotting disorders.  Certain inherited disorders.  Certain bleeding disorders.  Pregnancy.  Having an enlarged spleen (hypersplenism). In hypersplenism, the spleen gathers up platelets from circulation. This means the platelets are not available to help with blood clotting. The spleen can enlarge due to cirrhosis or other conditions. SYMPTOMS  The symptoms of thrombocytopenia are side effects of poor blood clotting. Some of these are:  Abnormal bleeding.  Nosebleeds.  Heavy menstrual periods.  Blood in the urine or stools.  Purpura. This is a purplish discoloration in the skin produced by small bleeding vessels near the surface of the skin.  Bruising.  A rash that may be petechial. This looks like pinpoint, purplish-red spots on the skin and mucous membranes. It is caused by bleeding from small blood vessels (capillaries). DIAGNOSIS  Your caregiver will make this diagnosis based on your exam and blood tests. Sometimes, a bone marrow study is done to look for the original cells (megakaryocytes) that make platelets. TREATMENT  Treatment depends on the cause of the condition.  Medicines may be given to help protect your platelets from being destroyed.  In some cases, a replacement (transfusion) of platelets may be required to stop or prevent  bleeding.  Sometimes, the spleen must be surgically removed. HOME CARE INSTRUCTIONS   Check the skin and linings inside your mouth for bruising or bleeding as directed by your caregiver.  Check your sputum, urine, and stool for blood as directed by your caregiver.  Do not return to any activities that could cause bumps or bruises until your caregiver says it is okay.  Take extra care not to cut yourself when shaving or when using scissors, needles, knives, and other tools.  Take extra care not to burn yourself when ironing or cooking.  Ask your caregiver if it is okay for you to drink alcohol.  Only take over-the-counter or prescription medicines as directed by your caregiver.  Notify all your caregivers, including dentists and eye doctors, about your condition. SEEK IMMEDIATE MEDICAL CARE IF:   You develop active bleeding from anywhere in your body.  You develop unexplained bruising or bleeding.  You have blood in your sputum, urine, or stool. MAKE SURE YOU:  Understand these instructions.  Will watch your condition.  Will get help right away if you are not doing well or get worse. Document Released: 04/07/2005 Document Revised: 06/30/2011 Document Reviewed: 02/07/2011 Exit[employer] Patient Information 2014 [employer].  

## 2013-07-11 NOTE — Progress Notes (Signed)
[place] OFFICE PROGRESS NOTE  Patient Care Team: [name], MD as PCP - General (Family Medicine)  DIAGNOSIS: 63 year old gentleman with MGUS and thrombocytopenia   SUMMARY OF ONCOLOGIC HISTORY: 1. When a bone marrow biopsy and aspirate that revealed 3% plasma cells in the bone marrow. With monoclonal gammopathy of uncertain significance.(MGUS) 2. Thrombocytopenia  CURRENT THERAPY: Observation   INTERVAL HISTORY: Dennis Wise 63 y.o. male returns for followup visit today at 73 months. Clinically he seems to be doing well. He has continued to see Dr. Damita Dunnings his primary care physician. He is a little bit hypertensive but he states that this is usually at the [facility] and not at his primary care doctor's office. He remains asymptomatic from this. He has been very active. He does have some bruises on his arms but he is outside outdoors fixing things quite a bit. He has no spontaneous bleeding. He denies any back pain or any other myalgias and arthralgias.   I have reviewed the past medical history, past surgical history, social history and family history with the patient and they are unchanged from previous note.  ALLERGIES:  is allergic to promethazine hcl.  MEDICATIONS:  Current Outpatient Prescriptions  Medication Sig Dispense Refill  . aspirin 81 MG tablet Take 81 mg by mouth daily.        Marland Kitchen atorvastatin (LIPITOR) 10 MG tablet TAKE 1 TABLET AT BEDTIME  90 tablet  0  . glyBURIDE (DIABETA) 2.5 MG tablet Take 1 tablet (2.5 mg total) by mouth at bedtime.  90 tablet  3  . metFORMIN (GLUCOPHAGE) 850 MG tablet TAKE 1 TABLET TWO  TIMES A DAY      . Multiple Vitamin (MULTIVITAMIN) tablet Take 1 tablet by mouth daily.        . niacin (NIASPAN) 500 MG CR tablet TAKE 3 TABLETS AT BEDTIME  270 tablet  2  . pioglitazone (ACTOS) 45 MG tablet TAKE 1 TABLET DAILY  90 tablet  1  . ramipril (ALTACE) 10 MG capsule TAKE 1 CAPSULE DAILY  90 capsule  2   No current  facility-administered medications for this visit.    REVIEW OF SYSTEMS:   Constitutional: Denies fevers, chills or abnormal weight loss Eyes: Denies blurriness of vision Ears, nose, mouth, throat, and face: Denies mucositis or sore throat Respiratory: Denies cough, dyspnea or wheezes Cardiovascular: Denies palpitation, chest discomfort or lower extremity swelling Gastrointestinal:  Denies nausea, heartburn or change in bowel habits Skin: Denies abnormal skin rashes Lymphatics: Denies new lymphadenopathy or easy bruising Hematologic: no bleeding or epistaxis, hematuria or hematochezia Neurological:Denies numbness, tingling or new weaknesses, no gait disturbances Behavioral/Psych: Mood is stable, no new changes  All other systems were reviewed with the patient and are negative.  PHYSICAL EXAMINATION: ECOG PERFORMANCE STATUS: 1 - Symptomatic but completely ambulatory  Filed Vitals:   07/11/13 1029  BP: 185/79  Pulse: 63  Temp: 98 F (36.7 C)  Resp: 18   Filed Weights   07/11/13 1029  Weight: 279 lb 8 oz (126.78 kg)    GENERAL:alert, no distress and comfortable SKIN: skin color, texture, turgor are normal, no rashes or significant lesions EYES: normal, Conjunctiva are pink and non-injected, sclera clear OROPHARYNX:no exudate, no erythema and lips, buccal mucosa, and tongue normal  NECK: supple, thyroid normal size, non-tender, without nodularity LYMPH:  no palpable lymphadenopathy in the cervical, axillary or inguinal LUNGS: clear to auscultation and percussion with normal breathing effort HEART: regular rate & rhythm and  no murmurs and no lower extremity edema ABDOMEN:abdomen soft, non-tender and normal bowel sounds Musculoskeletal:no cyanosis of digits and no clubbing  NEURO: alert & oriented x 3 with fluent speech, no focal motor/sensory deficits  LABORATORY DATA:  I have reviewed the data as listed    Component Value Date/Time   NA 139 08/30/2012 0901   NA 140  05/24/2012 0824   K 4.5 08/30/2012 0901   K 4.0 05/24/2012 0824   CL 103 08/30/2012 0901   CL 104 05/24/2012 0824   CO2 27 08/30/2012 0901   CO2 29 05/24/2012 0824   GLUCOSE 155* 08/30/2012 0901   GLUCOSE 64* 05/24/2012 0824   BUN 22.0 08/30/2012 0901   BUN 18 05/24/2012 0824   CREATININE 1.1 08/30/2012 0901   CREATININE 1.0 05/24/2012 0824   CALCIUM 8.9 08/30/2012 0901   CALCIUM 9.2 05/24/2012 0824   PROT 6.7 08/30/2012 0901   PROT 6.5 05/24/2012 0824   ALBUMIN 3.7 08/30/2012 0901   ALBUMIN 4.1 05/24/2012 0824   AST 32 08/30/2012 0901   AST 21 05/24/2012 0824   ALT 40 08/30/2012 0901   ALT 18 05/24/2012 0824   ALKPHOS 133 08/30/2012 0901   ALKPHOS 84 05/24/2012 0824   BILITOT 0.98 08/30/2012 0901   BILITOT 0.9 05/24/2012 0824   GFRNONAA 92.28 10/16/2009 0843   GFRAA 111 08/24/2007 1015    No results found for this basename: SPEP, UPEP,  kappa and lambda light chains    Lab Results  Component Value Date   WBC 4.3 07/11/2013   NEUTROABS 2.6 07/11/2013   HGB 13.1 07/11/2013   HCT 39.1 07/11/2013   MCV 86.5 07/11/2013   PLT 105* 07/11/2013      Chemistry      Component Value Date/Time   NA 139 08/30/2012 0901   NA 140 05/24/2012 0824   K 4.5 08/30/2012 0901   K 4.0 05/24/2012 0824   CL 103 08/30/2012 0901   CL 104 05/24/2012 0824   CO2 27 08/30/2012 0901   CO2 29 05/24/2012 0824   BUN 22.0 08/30/2012 0901   BUN 18 05/24/2012 0824   CREATININE 1.1 08/30/2012 0901   CREATININE 1.0 05/24/2012 0824      Component Value Date/Time   CALCIUM 8.9 08/30/2012 0901   CALCIUM 9.2 05/24/2012 0824   ALKPHOS 133 08/30/2012 0901   ALKPHOS 84 05/24/2012 0824   AST 32 08/30/2012 0901   AST 21 05/24/2012 0824   ALT 40 08/30/2012 0901   ALT 18 05/24/2012 0824   BILITOT 0.98 08/30/2012 0901   BILITOT 0.9 05/24/2012 0824       RADIOGRAPHIC STUDIES: I have personally reviewed the radiological images as listed and agreed with the findings in the report. No results found.    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 He does not have any evidence of progressive disease. He has no endorgan damage.  #2 thrombocytopenia: Likely ITP we will continue to monitor he has not had any bleeding problems.  #3 prior history of anemia: This is now resolved.  #4 followup: Patient will be seen back in 6 months time with blood work.  Orders Placed This Encounter  Procedures  . CBC with Differential    Standing Status: Future     Number of Occurrences:      Standing Expiration Date: 07/11/2014  . Comprehensive metabolic panel (Cmet) - [facility]    Standing Status: Future     Number of  Occurrences:      Standing Expiration Date: 07/11/2014   All questions were answered. The patient knows to call the clinic with any problems, questions or concerns. No barriers to learning was detected. I spent 15 minutes counseling the patient face to face. The total time spent in the appointment was 30 minutes and more than 50% was on counseling and review of test results and coordination of care     , , MD 07/11/2013 11:08 AM    

## 2013-07-15 ENCOUNTER — Telehealth: Payer: Self-pay | Admitting: *Deleted

## 2013-07-15 NOTE — Telephone Encounter (Signed)
Message copied by Amelia Jo I on Fri Jul 15, 2013  1:21 PM ------      Message from: Deatra Robinson      Created: Wed Jul 13, 2013  3:03 PM       Iron studies look good ------

## 2013-07-15 NOTE — Telephone Encounter (Signed)
As noted below by Dr. Humphrey Rolls, I informed patient that his iron studies look good. Patient verified understanding.

## 2013-08-14 ENCOUNTER — Other Ambulatory Visit: Payer: Self-pay | Admitting: Family Medicine

## 2013-08-15 NOTE — Telephone Encounter (Signed)
Left detailed message on voicemail.  

## 2013-08-15 NOTE — Telephone Encounter (Signed)
Electronic refill request. Does this patient need a CPE scheduled?  Last OV:  12/02/12, last CPE:  05/27/12.  Please advise.

## 2013-08-15 NOTE — Telephone Encounter (Signed)
Due for CPE.  Thanks. Sent.

## 2013-10-06 ENCOUNTER — Other Ambulatory Visit: Payer: Commercial Managed Care - HMO

## 2013-10-09 ENCOUNTER — Other Ambulatory Visit: Payer: Self-pay | Admitting: Family Medicine

## 2013-10-09 DIAGNOSIS — E1139 Type 2 diabetes mellitus with other diabetic ophthalmic complication: Secondary | ICD-10-CM

## 2013-10-11 ENCOUNTER — Other Ambulatory Visit (INDEPENDENT_AMBULATORY_CARE_PROVIDER_SITE_OTHER): Payer: Commercial Managed Care - HMO

## 2013-10-11 DIAGNOSIS — E11319 Type 2 diabetes mellitus with unspecified diabetic retinopathy without macular edema: Secondary | ICD-10-CM

## 2013-10-11 DIAGNOSIS — E7800 Pure hypercholesterolemia, unspecified: Secondary | ICD-10-CM

## 2013-10-11 DIAGNOSIS — E1139 Type 2 diabetes mellitus with other diabetic ophthalmic complication: Secondary | ICD-10-CM

## 2013-10-11 DIAGNOSIS — I10 Essential (primary) hypertension: Secondary | ICD-10-CM

## 2013-10-11 DIAGNOSIS — E113299 Type 2 diabetes mellitus with mild nonproliferative diabetic retinopathy without macular edema, unspecified eye: Secondary | ICD-10-CM

## 2013-10-11 LAB — COMPREHENSIVE METABOLIC PANEL
ALBUMIN: 3.9 g/dL (ref 3.5–5.2)
ALK PHOS: 76 U/L (ref 39–117)
ALT: 16 U/L (ref 0–53)
AST: 17 U/L (ref 0–37)
BILIRUBIN TOTAL: 0.8 mg/dL (ref 0.2–1.2)
BUN: 20 mg/dL (ref 6–23)
CO2: 30 mEq/L (ref 19–32)
Calcium: 9 mg/dL (ref 8.4–10.5)
Chloride: 103 mEq/L (ref 96–112)
Creatinine, Ser: 0.9 mg/dL (ref 0.4–1.5)
GFR: 87.68 mL/min (ref >60.00)
GLUCOSE: 148 mg/dL — AB (ref 70–99)
POTASSIUM: 4.1 meq/L (ref 3.5–5.1)
Sodium: 139 mEq/L (ref 135–145)
Total Protein: 6.2 g/dL (ref 6.0–8.3)

## 2013-10-11 LAB — LIPID PANEL
CHOL/HDL RATIO: 2
CHOLESTEROL: 144 mg/dL (ref 0–200)
HDL: 68.5 mg/dL (ref >39.00)
LDL CALC: 45 mg/dL (ref 0–99)
NonHDL: 75.5
TRIGLYCERIDES: 153 mg/dL — AB (ref 0.0–149.0)
VLDL: 30.6 mg/dL (ref 0.0–40.0)

## 2013-10-11 LAB — HEMOGLOBIN A1C: Hgb A1c MFr Bld: 8.3 % — ABNORMAL HIGH (ref 4.6–6.5)

## 2013-10-12 ENCOUNTER — Encounter: Payer: Self-pay | Admitting: Family Medicine

## 2013-10-12 ENCOUNTER — Other Ambulatory Visit: Payer: Self-pay | Admitting: Family Medicine

## 2013-10-12 DIAGNOSIS — M25569 Pain in unspecified knee: Secondary | ICD-10-CM

## 2013-10-14 ENCOUNTER — Encounter: Payer: Self-pay | Admitting: Family Medicine

## 2013-10-14 ENCOUNTER — Ambulatory Visit (INDEPENDENT_AMBULATORY_CARE_PROVIDER_SITE_OTHER): Payer: Commercial Managed Care - HMO | Admitting: Family Medicine

## 2013-10-14 VITALS — BP 124/56 | HR 71 | Temp 97.9°F | Ht 74.0 in | Wt 280.8 lb

## 2013-10-14 DIAGNOSIS — E669 Obesity, unspecified: Secondary | ICD-10-CM

## 2013-10-14 DIAGNOSIS — Z0000 Encounter for general adult medical examination without abnormal findings: Secondary | ICD-10-CM

## 2013-10-14 DIAGNOSIS — Z23 Encounter for immunization: Secondary | ICD-10-CM

## 2013-10-14 DIAGNOSIS — Z7189 Other specified counseling: Secondary | ICD-10-CM

## 2013-10-14 DIAGNOSIS — E7800 Pure hypercholesterolemia, unspecified: Secondary | ICD-10-CM

## 2013-10-14 DIAGNOSIS — E11319 Type 2 diabetes mellitus with unspecified diabetic retinopathy without macular edema: Secondary | ICD-10-CM

## 2013-10-14 DIAGNOSIS — L570 Actinic keratosis: Secondary | ICD-10-CM

## 2013-10-14 DIAGNOSIS — E1139 Type 2 diabetes mellitus with other diabetic ophthalmic complication: Secondary | ICD-10-CM

## 2013-10-14 DIAGNOSIS — E113299 Type 2 diabetes mellitus with mild nonproliferative diabetic retinopathy without macular edema, unspecified eye: Secondary | ICD-10-CM

## 2013-10-14 DIAGNOSIS — I10 Essential (primary) hypertension: Secondary | ICD-10-CM

## 2013-10-14 DIAGNOSIS — E119 Type 2 diabetes mellitus without complications: Secondary | ICD-10-CM

## 2013-10-14 MED ORDER — METFORMIN HCL 850 MG PO TABS: ORAL_TABLET | ORAL | Status: DC

## 2013-10-14 MED ORDER — RAMIPRIL 10 MG PO CAPS: ORAL_CAPSULE | ORAL | Status: DC

## 2013-10-14 MED ORDER — ATORVASTATIN CALCIUM 10 MG PO TABS: ORAL_TABLET | ORAL | Status: DC

## 2013-10-14 MED ORDER — PIOGLITAZONE HCL 45 MG PO TABS: ORAL_TABLET | ORAL | Status: DC

## 2013-10-14 MED ORDER — GLYBURIDE 2.5 MG PO TABS: 2.5000 mg | ORAL_TABLET | Freq: Every day | ORAL | Status: DC

## 2013-10-14 NOTE — Progress Notes (Signed)
Pre visit review using our clinic review tool, if applicable. No additional management support is needed unless otherwise documented below in the visit note.   I have personally reviewed the Medicare Annual Wellness questionnaire and have noted 1. The patient's medical and social history 2. Their use of alcohol, tobacco or illicit drugs 3. Their current medications and supplements 4. The patient's functional ability including ADL's, fall risks, home safety risks and hearing or visual             impairment. 5. Diet and physical activities 6. Evidence for depression or mood disorders  The patients weight, height, BMI have been recorded in the chart and visual acuity is per eye clinic.  I have made referrals, counseling and provided education to the patient based review of the above and I have provided the pt with a written personalized care plan for preventive services.  See scanned forms.  Routine anticipatory guidance given to patient.  See health maintenance. Flu 2014 Shingles 2014 PNA 2012, updated with 13 valent 2015 Tetanus 2011 Colonoscopy 2013 Prostate cancer screening and PSA options (with potential risks and benefits of testing vs not testing) were discussed along with recent recs/guidelines.  He declined testing PSA at this point. Advance directive- wife designated if patient were incapacitated.   Cognitive function addressed- see scanned forms- and if abnormal then additional documentation follows.   Diabetes:  Using medications without difficulties: yes Hypoglycemic episodes:no Hyperglycemic episodes:yes Feet problems:no Blood Sugars averaging: elevated recently, stress eating with mult deaths in the family (condolences offered) eye exam within last year: yes Diet and weight is the issue here.  "I don't drink, I don't use drugs, I eat."  Discussed.  A1c d/w pt.   Hypertension:  Using medication without problems or lightheadedness: yes Chest pain with  exertion:no Edema:no Short of breath:no  Elevated Cholesterol: Using medications without problems:yes Muscle aches: some cramping attributed to hot weather.  Diet compliance:see above Exercise: some, encouraged.   PMH and SH reviewed  Meds, vitals, and allergies reviewed.   ROS: See HPI.  Otherwise negative.    GEN: nad, alert and oriented HEENT: mucous membranes moist NECK: supple w/o LA CV: rrr. PULM: ctab, no inc wob ABD: soft, +bs EXT: no edema SKIN: no acute rash but diffuse AKs noted, d/w pt.    Diabetic foot exam: Normal inspection No skin breakdown No calluses  Normal DP pulses Normal sensation to light touch and monofilament Nails normal

## 2013-10-14 NOTE — Patient Instructions (Signed)
Work on [employer] and weight in the meantime.   Recheck in about 3-4 months, A1c before a visit.  Take care.  Glad to see you.

## 2013-10-16 DIAGNOSIS — L570 Actinic keratosis: Secondary | ICD-10-CM | POA: Insufficient documentation

## 2013-10-16 DIAGNOSIS — Z7189 Other specified counseling: Secondary | ICD-10-CM | POA: Insufficient documentation

## 2013-10-16 DIAGNOSIS — E669 Obesity, unspecified: Secondary | ICD-10-CM | POA: Insufficient documentation

## 2013-10-16 HISTORY — DX: Actinic keratosis: L57.0

## 2013-10-16 HISTORY — DX: Obesity, unspecified: E66.9

## 2013-10-16 MED ORDER — RAMIPRIL 10 MG PO CAPS: ORAL_CAPSULE | ORAL | Status: DC

## 2013-10-16 MED ORDER — GLYBURIDE 2.5 MG PO TABS: 2.5000 mg | ORAL_TABLET | Freq: Every day | ORAL | Status: DC

## 2013-10-16 MED ORDER — METFORMIN HCL 850 MG PO TABS: ORAL_TABLET | ORAL | Status: DC

## 2013-10-16 MED ORDER — ATORVASTATIN CALCIUM 10 MG PO TABS: ORAL_TABLET | ORAL | Status: DC

## 2013-10-16 MED ORDER — PIOGLITAZONE HCL 45 MG PO TABS: ORAL_TABLET | ORAL | Status: DC

## 2013-10-16 NOTE — Assessment & Plan Note (Signed)
Needs work on DM2 to get TGs down. D/w pt. He agrees.

## 2013-10-16 NOTE — Assessment & Plan Note (Signed)
Refer to derm  ?

## 2013-10-16 NOTE — Assessment & Plan Note (Signed)
See above

## 2013-10-16 NOTE — Assessment & Plan Note (Signed)
Controlled, work on weight.  No change in meds.  D/w pt about weight and labs.

## 2013-10-16 NOTE — Assessment & Plan Note (Signed)
Normal foot exam today, he'll work on diet in the meantime.  No change in meds. Recheck A1c in a few months.  He agrees.

## 2013-10-16 NOTE — Assessment & Plan Note (Signed)
See scanned forms. Routine anticipatory guidance given to patient. See health maintenance.  Flu 2014  Shingles 2014  PNA 2012, updated with 13 valent 2015  Tetanus 2011  Colonoscopy 2013  Prostate cancer screening and PSA options (with potential risks and benefits of testing vs not testing) were discussed along with recent recs/guidelines. He declined testing PSA at this point.  Advance directive- wife designated if patient were incapacitated.  Cognitive function addressed- see scanned forms- and if abnormal then additional documentation follows.

## 2013-10-19 ENCOUNTER — Telehealth: Payer: Self-pay

## 2013-10-19 NOTE — Telephone Encounter (Signed)
Prior authorization has been submitted through covermymeds--awaiting response

## 2013-10-19 NOTE — Telephone Encounter (Signed)
Received fax notification from insurance that the medication has been approved through 04/20/2014--[employer]

## 2013-11-22 ENCOUNTER — Telehealth: Payer: Self-pay

## 2013-11-22 NOTE — Telephone Encounter (Signed)
Pt has been working outside with chain saw clearing brush; pt said vibration in rt side of chest started 11/21/13 and is continuous every 5-10 seconds. Pt has pain in lt arm but pt thinks that may be from holding chain saw. Pt is concerned could be heart related due to pts age and him working out in the heat. No N or V, no h/a or dizziness. No SOB or extremity weakness. Pt has not missed any of his meds. Dr Damita Dunnings said if pt thinks is heart related should go to [facility] or ED now and if not can schedule appt on 11/23/13. Pt said he wants to get checked out today. Pt lives closer to [place] will talk with his wife and either go to [facility] ED or a UC in [place].

## 2013-11-22 NOTE — Telephone Encounter (Signed)
That's reasonable. Thanks.

## 2014-01-05 ENCOUNTER — Telehealth: Payer: Self-pay | Admitting: Adult Health

## 2014-01-09 DIAGNOSIS — D472 Monoclonal gammopathy: Secondary | ICD-10-CM | POA: Insufficient documentation

## 2014-01-11 ENCOUNTER — Ambulatory Visit: Payer: Commercial Managed Care - HMO | Admitting: Oncology

## 2014-01-11 ENCOUNTER — Other Ambulatory Visit: Payer: Commercial Managed Care - HMO

## 2014-01-17 ENCOUNTER — Telehealth: Payer: Self-pay | Admitting: Adult Health

## 2014-01-17 ENCOUNTER — Encounter: Payer: Self-pay | Admitting: Adult Health

## 2014-01-17 ENCOUNTER — Ambulatory Visit (HOSPITAL_BASED_OUTPATIENT_CLINIC_OR_DEPARTMENT_OTHER): Payer: Commercial Managed Care - HMO | Admitting: Adult Health

## 2014-01-17 ENCOUNTER — Other Ambulatory Visit (HOSPITAL_BASED_OUTPATIENT_CLINIC_OR_DEPARTMENT_OTHER): Payer: Commercial Managed Care - HMO

## 2014-01-17 VITALS — BP 157/85 | HR 58 | Temp 98.5°F | Resp 18 | Ht 74.0 in | Wt 287.4 lb

## 2014-01-17 DIAGNOSIS — D472 Monoclonal gammopathy: Secondary | ICD-10-CM

## 2014-01-17 DIAGNOSIS — D696 Thrombocytopenia, unspecified: Secondary | ICD-10-CM

## 2014-01-17 DIAGNOSIS — D61818 Other pancytopenia: Secondary | ICD-10-CM

## 2014-01-17 LAB — CBC WITH DIFFERENTIAL/PLATELET
BASO%: 0.2 % (ref 0.0–2.0)
BASOS ABS: 0 10*3/uL (ref 0.0–0.1)
EOS%: 2 % (ref 0.0–7.0)
Eosinophils Absolute: 0.1 10*3/uL (ref 0.0–0.5)
HEMATOCRIT: 36.8 % — AB (ref 38.4–49.9)
HEMOGLOBIN: 12.7 g/dL — AB (ref 13.0–17.1)
LYMPH#: 1.2 10*3/uL (ref 0.9–3.3)
LYMPH%: 24.4 % (ref 14.0–49.0)
MCH: 30 pg (ref 27.2–33.4)
MCHC: 34.5 g/dL (ref 32.0–36.0)
MCV: 86.8 fL (ref 79.3–98.0)
MONO#: 0.5 10*3/uL (ref 0.1–0.9)
MONO%: 10 % (ref 0.0–14.0)
NEUT%: 63.4 % (ref 39.0–75.0)
NEUTROS ABS: 3.1 10*3/uL (ref 1.5–6.5)
PLATELETS: 108 10*3/uL — AB (ref 140–400)
RBC: 4.24 10*6/uL (ref 4.20–5.82)
RDW: 13.1 % (ref 11.0–14.6)
WBC: 4.9 10*3/uL (ref 4.0–10.3)

## 2014-01-17 LAB — COMPREHENSIVE METABOLIC PANEL (CC13)
ALK PHOS: 96 U/L (ref 40–150)
ALT: 12 U/L (ref 0–55)
AST: 16 U/L (ref 5–34)
Albumin: 3.8 g/dL (ref 3.5–5.0)
Anion Gap: 6 mEq/L (ref 3–11)
BILIRUBIN TOTAL: 0.65 mg/dL (ref 0.20–1.20)
BUN: 20.6 mg/dL (ref 7.0–26.0)
CO2: 29 mEq/L (ref 22–29)
Calcium: 9.5 mg/dL (ref 8.4–10.4)
Chloride: 105 mEq/L (ref 98–109)
Creatinine: 1.1 mg/dL (ref 0.7–1.3)
Glucose: 167 mg/dl — ABNORMAL HIGH (ref 70–140)
Potassium: 4.8 mEq/L (ref 3.5–5.1)
SODIUM: 140 meq/L (ref 136–145)
TOTAL PROTEIN: 6.9 g/dL (ref 6.4–8.3)

## 2014-01-17 NOTE — Telephone Encounter (Signed)
, °

## 2014-01-17 NOTE — Progress Notes (Addendum)
[place] OFFICE PROGRESS NOTE  Patient Care Team: [name], MD as PCP - General (Family Medicine)  DIAGNOSIS: 64 year old gentleman with MGUS and thrombocytopenia   SUMMARY OF ONCOLOGIC HISTORY: 1. When a bone marrow biopsy and aspirate that revealed 3% plasma cells in the bone marrow he was diagnosed by Dr. Humphrey Rolls with monoclonal gammopathy of uncertain significance.(MGUS) 2. Thrombocytopenia  CURRENT THERAPY: Observation   INTERVAL HISTORY: Dennis Wise 62 y.o. male returns for followup visit today at 12 months. Clinically he seems to be doing well.He tells me that he was diagnosed with MGUS about 3 years ago when he broke his leg and had a new onset anemia.  He required IV iron and was diagnosed with MGUS.  He has not required any IV iron or PO iron at this point.  He has been doing well this year.  He denies bone pain, any new lumps or bumps anywhere, or change in urination patterns.  A 10 point ROS is otherwise neg.   Past Medical History  Diagnosis Date  . GERD (gastroesophageal reflux disease) 10/90  . Hypertension 08/01  . Hyperlipidemia   . Internal hemorrhoids   . Anemia     Iron deficiency part of this  . Pneumonia   . Pancytopenia   . MGUS (monoclonal gammopathy of unknown significance)   . Diabetes mellitus     type II  . Personal history of colonic adenomas 06/05/2012  . Helicobacter pylori gastritis 06/11/2011    On EGD 05/2011   . Iron deficiency anemia, unspecified 03/04/2011   Past Surgical History  Procedure Laterality Date  . Knee surgery  09/04    lt knee fx repair ORIF  . Tibia fracture surgery      left 2012  . Flexible sigmoidoscopy  05/1988    internal hemorrhoids   Family History  Problem Relation Age of Onset  . Skin cancer Brother   . Diabetes Brother   . Emphysema Father   . Cancer Father     ?  Marland Kitchen Alzheimer's disease Father   . Cancer Mother     ?  . Diabetes Mother   . Heart failure Mother   . Colon cancer Neg Hx    . Prostate cancer Neg Hx   . Esophageal cancer Neg Hx   . Rectal cancer Neg Hx   . Stomach cancer Neg Hx      ALLERGIES:  is allergic to promethazine hcl.  MEDICATIONS:  Current Outpatient Prescriptions  Medication Sig Dispense Refill  . aspirin 81 MG tablet Take 81 mg by mouth daily.        Marland Kitchen atorvastatin (LIPITOR) 10 MG tablet TAKE 1 TABLET AT BEDTIME  90 tablet  3  . glyBURIDE (DIABETA) 2.5 MG tablet Take 1 tablet (2.5 mg total) by mouth at bedtime.  90 tablet  3  . metFORMIN (GLUCOPHAGE) 850 MG tablet TAKE 1 TABLET TWO  TIMES A DAY  180 tablet  3  . Multiple Vitamin (MULTIVITAMIN) tablet Take 1 tablet by mouth daily.        . niacin (NIASPAN) 500 MG CR tablet TAKE 3 TABLETS AT BEDTIME  270 tablet  2  . pioglitazone (ACTOS) 45 MG tablet TAKE 1 TABLET DAILY  90 tablet  3  . ramipril (ALTACE) 10 MG capsule TAKE 1 CAPSULE DAILY  90 capsule  3   No current facility-administered medications for this visit.    REVIEW OF SYSTEMS:   A 10 point review  of systems was conducted and is otherwise negative except for what is noted above.     PHYSICAL EXAMINATION: ECOG PERFORMANCE STATUS: 1 - Symptomatic but completely ambulatory  Filed Vitals:   01/17/14 1343  BP: 157/85  Pulse: 58  Temp: 98.5 F (36.9 C)  Resp: 18   Filed Weights   01/17/14 1343  Weight: 287 lb 6.4 oz (130.364 kg)    GENERAL: Patient is a well appearing male in no acute distress HEENT:  Sclerae anicteric.  Oropharynx clear and moist. No ulcerations or evidence of oropharyngeal candidiasis. Neck is supple.  NODES:  No cervical, supraclavicular, or axillary lymphadenopathy palpated.  LUNGS:  Clear to auscultation bilaterally.  No wheezes or rhonchi. HEART:  Regular rate and rhythm. No murmur appreciated. ABDOMEN:  Soft, nontender.  Positive, normoactive bowel sounds. No organomegaly palpated. MSK:  No focal spinal tenderness to palpation. Full range of motion bilaterally in the upper  extremities. EXTREMITIES:  No peripheral edema.   SKIN:  Clear with no obvious rashes or skin changes. No nail dyscrasia. NEURO:  Nonfocal. Well oriented.  Appropriate affect.    LABORATORY DATA:  I have reviewed the data as listed    Component Value Date/Time   NA 139 10/11/2013 0905   NA 139 08/30/2012 0901   K 4.1 10/11/2013 0905   K 4.5 08/30/2012 0901   CL 103 10/11/2013 0905   CL 103 08/30/2012 0901   CO2 30 10/11/2013 0905   CO2 27 08/30/2012 0901   GLUCOSE 148* 10/11/2013 0905   GLUCOSE 155* 08/30/2012 0901   BUN 20 10/11/2013 0905   BUN 22.0 08/30/2012 0901   CREATININE 0.9 10/11/2013 0905   CREATININE 1.1 08/30/2012 0901   CALCIUM 9.0 10/11/2013 0905   CALCIUM 8.9 08/30/2012 0901   PROT 6.2 10/11/2013 0905   PROT 6.7 08/30/2012 0901   ALBUMIN 3.9 10/11/2013 0905   ALBUMIN 3.7 08/30/2012 0901   AST 17 10/11/2013 0905   AST 32 08/30/2012 0901   ALT 16 10/11/2013 0905   ALT 40 08/30/2012 0901   ALKPHOS 76 10/11/2013 0905   ALKPHOS 133 08/30/2012 0901   BILITOT 0.8 10/11/2013 0905   BILITOT 0.98 08/30/2012 0901   GFRNONAA 92.28 10/16/2009 0843   GFRAA 111 08/24/2007 1015    No results found for this basename: SPEP,  UPEP,   kappa and lambda light chains    Lab Results  Component Value Date   WBC 4.9 01/17/2014   NEUTROABS 3.1 01/17/2014   HGB 12.7* 01/17/2014   HCT 36.8* 01/17/2014   MCV 86.8 01/17/2014   PLT 108* 01/17/2014      Chemistry      Component Value Date/Time   NA 139 10/11/2013 0905   NA 139 08/30/2012 0901   K 4.1 10/11/2013 0905   K 4.5 08/30/2012 0901   CL 103 10/11/2013 0905   CL 103 08/30/2012 0901   CO2 30 10/11/2013 0905   CO2 27 08/30/2012 0901   BUN 20 10/11/2013 0905   BUN 22.0 08/30/2012 0901   CREATININE 0.9 10/11/2013 0905   CREATININE 1.1 08/30/2012 0901      Component Value Date/Time   CALCIUM 9.0 10/11/2013 0905   CALCIUM 8.9 08/30/2012 0901   ALKPHOS 76 10/11/2013 0905   ALKPHOS 133 08/30/2012 0901   AST 17 10/11/2013 0905   AST 32 08/30/2012 0901   ALT 16  10/11/2013 0905   ALT 40 08/30/2012 0901   BILITOT 0.8 10/11/2013 0905   BILITOT 0.98 08/30/2012  0901       RADIOGRAPHIC STUDIES: I have personally reviewed the radiological images as listed and agreed with the findings in the report. No results found.    ASSESSMENT:  63 year old gentleman and per Dr. Humphrey Rolls has:   #1 MGUS  #2 thrombocytopenia: Likely ITP  PLAN:  I saw the patient today in conjunction with Dr. Lindi Adie.  His lab work is stable and I reviewed his CBC with him in detail.  He does continue to have a mild anemia and thrombocytopenia.  I reviewed the case with Dr. Lindi Adie who also saw the patient and is unsure if MGUS is the correct diagnosis.  Please see his addendum.    Mr. Gilreath will return in 1 year for a CBC and f/u apt.   He knows to call us in the interim for any questions or concerns.  We can certainly see him sooner if needed.  I spent 25 minutes counseling the patient face to face. The total time spent in the appointment was 30 minutes and more than 50% was on counseling and review of test results and coordination of care    Minette Headland, [place] [phone] 01/17/2014 2:00 PM  Attending Note  I personally saw and examined Dennis Wise. The plan of care was discussed with him. I agree with the assessment and plan as documented above. I reviewed his lab work: There is no evidence of monoclonal protein in his blood. I believe his MGUS has resolved but we will continue to monitor him once a year now. Mild thrombocytopenia: Thought to be related to immune thrombocytopenia. It is stable and has not been treated.  Signed Rulon Eisenmenger, MD

## 2014-01-19 ENCOUNTER — Encounter: Payer: Self-pay | Admitting: Family Medicine

## 2014-01-19 LAB — SPEP & IFE WITH QIG
ALBUMIN ELP: 63.4 % (ref 55.8–66.1)
ALPHA-1-GLOBULIN: 3.7 % (ref 2.9–4.9)
ALPHA-2-GLOBULIN: 9.5 % (ref 7.1–11.8)
Beta 2: 4.4 % (ref 3.2–6.5)
Beta Globulin: 7 % (ref 4.7–7.2)
Gamma Globulin: 12 % (ref 11.1–18.8)
IgA: 206 mg/dL (ref 68–379)
IgG (Immunoglobin G), Serum: 864 mg/dL (ref 650–1600)
IgM, Serum: 49 mg/dL (ref 41–251)
TOTAL PROTEIN, SERUM ELECTROPHOR: 6.5 g/dL (ref 6.0–8.3)

## 2014-03-21 LAB — HM DIABETES EYE EXAM

## 2014-06-12 ENCOUNTER — Other Ambulatory Visit (INDEPENDENT_AMBULATORY_CARE_PROVIDER_SITE_OTHER): Payer: Commercial Managed Care - HMO

## 2014-06-12 ENCOUNTER — Ambulatory Visit: Payer: Commercial Managed Care - HMO | Admitting: Family Medicine

## 2014-06-12 DIAGNOSIS — E119 Type 2 diabetes mellitus without complications: Secondary | ICD-10-CM | POA: Diagnosis not present

## 2014-06-12 LAB — HEMOGLOBIN A1C: Hgb A1c MFr Bld: 7.3 % — ABNORMAL HIGH (ref 4.6–6.5)

## 2014-06-16 ENCOUNTER — Ambulatory Visit (INDEPENDENT_AMBULATORY_CARE_PROVIDER_SITE_OTHER): Payer: Commercial Managed Care - HMO | Admitting: Family Medicine

## 2014-06-16 ENCOUNTER — Encounter: Payer: Self-pay | Admitting: Family Medicine

## 2014-06-16 VITALS — BP 132/60 | HR 66 | Temp 98.5°F | Wt 290.8 lb

## 2014-06-16 DIAGNOSIS — E11319 Type 2 diabetes mellitus with unspecified diabetic retinopathy without macular edema: Secondary | ICD-10-CM

## 2014-06-16 DIAGNOSIS — E113299 Type 2 diabetes mellitus with mild nonproliferative diabetic retinopathy without macular edema, unspecified eye: Secondary | ICD-10-CM

## 2014-06-16 MED ORDER — GLIMEPIRIDE 2 MG PO TABS: 2.0000 mg | ORAL_TABLET | Freq: Every day | ORAL | Status: DC

## 2014-06-16 NOTE — Patient Instructions (Addendum)
Dennis Wise will call about your referral.  Tell her you want to go back to [facility].  Schedule a physical for later this year when possible.   Take care.  Keep working on your diet and keep exercising.   Glad to see you.

## 2014-06-16 NOTE — Progress Notes (Signed)
Pre visit review using our clinic review tool, if applicable. No additional management support is needed unless otherwise documented below in the visit note.  Diabetes:  Using medications without difficulties: yes, see below.  Hypoglycemic episodes:no Hyperglycemic episodes:not usually.   Feet problems:no Blood Sugars averaging: ~110 usually in AM.  eye exam within last year:yes, done at [facility] 03/21/2014 Needed change to glimepiride for coverage.  D/w pt.  A1c improved.   D/w pt.    Meds, vitals, and allergies reviewed.   ROS: See HPI.  Otherwise negative.    GEN: nad, alert and oriented HEENT: mucous membranes moist NECK: supple w/o LA CV: rrr. PULM: ctab, no inc wob ABD: soft, +bs EXT: no edema SKIN: no acute rash  Diabetic foot exam: Normal inspection No skin breakdown No calluses  Normal DP pulses Normal sensation to light touch and monofilament Nails normal

## 2014-06-18 NOTE — Assessment & Plan Note (Addendum)
Improved, no change in meds other than sulfonylurea substitution.  He'll work on [employer] and diet in the meantime.  Recheck later in 2016.  He agrees.  He'll update me re: sugar in meantime if needed.

## 2014-06-21 ENCOUNTER — Encounter: Payer: Self-pay | Admitting: Family Medicine

## 2014-06-22 ENCOUNTER — Encounter: Payer: Self-pay | Admitting: Family Medicine

## 2014-06-22 DIAGNOSIS — I10 Essential (primary) hypertension: Secondary | ICD-10-CM | POA: Diagnosis not present

## 2014-06-22 DIAGNOSIS — E11311 Type 2 diabetes mellitus with unspecified diabetic retinopathy with macular edema: Secondary | ICD-10-CM | POA: Diagnosis not present

## 2014-07-20 ENCOUNTER — Other Ambulatory Visit: Payer: Self-pay | Admitting: Family Medicine

## 2014-07-31 DIAGNOSIS — H269 Unspecified cataract: Secondary | ICD-10-CM | POA: Diagnosis not present

## 2014-07-31 DIAGNOSIS — E11351 Type 2 diabetes mellitus with proliferative diabetic retinopathy with macular edema: Secondary | ICD-10-CM | POA: Diagnosis not present

## 2014-07-31 DIAGNOSIS — E11311 Type 2 diabetes mellitus with unspecified diabetic retinopathy with macular edema: Secondary | ICD-10-CM | POA: Diagnosis not present

## 2014-07-31 DIAGNOSIS — H3581 Retinal edema: Secondary | ICD-10-CM | POA: Diagnosis not present

## 2014-09-04 ENCOUNTER — Encounter: Payer: Self-pay | Admitting: Family Medicine

## 2014-09-21 DIAGNOSIS — L570 Actinic keratosis: Secondary | ICD-10-CM | POA: Diagnosis not present

## 2014-09-21 DIAGNOSIS — D225 Melanocytic nevi of trunk: Secondary | ICD-10-CM | POA: Diagnosis not present

## 2014-09-21 DIAGNOSIS — D2261 Melanocytic nevi of right upper limb, including shoulder: Secondary | ICD-10-CM | POA: Diagnosis not present

## 2014-09-21 DIAGNOSIS — X32XXXA Exposure to sunlight, initial encounter: Secondary | ICD-10-CM | POA: Diagnosis not present

## 2014-09-21 DIAGNOSIS — D2271 Melanocytic nevi of right lower limb, including hip: Secondary | ICD-10-CM | POA: Diagnosis not present

## 2014-09-25 DIAGNOSIS — H4311 Vitreous hemorrhage, right eye: Secondary | ICD-10-CM | POA: Diagnosis not present

## 2014-09-25 DIAGNOSIS — E11351 Type 2 diabetes mellitus with proliferative diabetic retinopathy with macular edema: Secondary | ICD-10-CM | POA: Diagnosis not present

## 2014-10-08 ENCOUNTER — Other Ambulatory Visit: Payer: Self-pay | Admitting: Family Medicine

## 2014-10-08 DIAGNOSIS — E113299 Type 2 diabetes mellitus with mild nonproliferative diabetic retinopathy without macular edema, unspecified eye: Secondary | ICD-10-CM

## 2014-10-16 ENCOUNTER — Other Ambulatory Visit (INDEPENDENT_AMBULATORY_CARE_PROVIDER_SITE_OTHER): Payer: Commercial Managed Care - HMO

## 2014-10-16 ENCOUNTER — Other Ambulatory Visit: Payer: Self-pay

## 2014-10-16 DIAGNOSIS — D472 Monoclonal gammopathy: Secondary | ICD-10-CM

## 2014-10-16 DIAGNOSIS — E11319 Type 2 diabetes mellitus with unspecified diabetic retinopathy without macular edema: Secondary | ICD-10-CM | POA: Diagnosis not present

## 2014-10-16 DIAGNOSIS — E113299 Type 2 diabetes mellitus with mild nonproliferative diabetic retinopathy without macular edema, unspecified eye: Secondary | ICD-10-CM

## 2014-10-16 DIAGNOSIS — D61818 Other pancytopenia: Secondary | ICD-10-CM

## 2014-10-16 LAB — LIPID PANEL
Cholesterol: 146 mg/dL (ref 0–200)
HDL: 65.4 mg/dL (ref >39.00)
LDL Cholesterol: 62 mg/dL (ref 0–99)
NonHDL: 80.6
TRIGLYCERIDES: 91 mg/dL (ref 0.0–149.0)
Total CHOL/HDL Ratio: 2
VLDL: 18.2 mg/dL (ref 0.0–40.0)

## 2014-10-16 LAB — COMPREHENSIVE METABOLIC PANEL
ALT: 12 U/L (ref 0–53)
AST: 15 U/L (ref 0–37)
Albumin: 3.9 g/dL (ref 3.5–5.2)
Alkaline Phosphatase: 89 U/L (ref 39–117)
BUN: 26 mg/dL — AB (ref 6–23)
CALCIUM: 9.1 mg/dL (ref 8.4–10.5)
CHLORIDE: 102 meq/L (ref 96–112)
CO2: 29 meq/L (ref 19–32)
CREATININE: 1.24 mg/dL (ref 0.40–1.50)
GFR: 61.94 mL/min (ref >60.00)
GLUCOSE: 138 mg/dL — AB (ref 70–99)
POTASSIUM: 4.8 meq/L (ref 3.5–5.1)
Sodium: 135 mEq/L (ref 135–145)
Total Bilirubin: 0.9 mg/dL (ref 0.2–1.2)
Total Protein: 6.6 g/dL (ref 6.0–8.3)

## 2014-10-16 LAB — HEMOGLOBIN A1C: Hgb A1c MFr Bld: 7.1 % — ABNORMAL HIGH (ref 4.6–6.5)

## 2014-10-19 ENCOUNTER — Encounter: Payer: Self-pay | Admitting: Family Medicine

## 2014-10-19 ENCOUNTER — Ambulatory Visit (INDEPENDENT_AMBULATORY_CARE_PROVIDER_SITE_OTHER): Payer: Commercial Managed Care - HMO | Admitting: Family Medicine

## 2014-10-19 VITALS — BP 128/50 | HR 76 | Temp 98.6°F | Wt 286.5 lb

## 2014-10-19 DIAGNOSIS — H9190 Unspecified hearing loss, unspecified ear: Secondary | ICD-10-CM

## 2014-10-19 DIAGNOSIS — E7800 Pure hypercholesterolemia, unspecified: Secondary | ICD-10-CM

## 2014-10-19 DIAGNOSIS — I10 Essential (primary) hypertension: Secondary | ICD-10-CM | POA: Diagnosis not present

## 2014-10-19 DIAGNOSIS — H9191 Unspecified hearing loss, right ear: Secondary | ICD-10-CM

## 2014-10-19 DIAGNOSIS — E113299 Type 2 diabetes mellitus with mild nonproliferative diabetic retinopathy without macular edema, unspecified eye: Secondary | ICD-10-CM

## 2014-10-19 DIAGNOSIS — Z0000 Encounter for general adult medical examination without abnormal findings: Secondary | ICD-10-CM

## 2014-10-19 DIAGNOSIS — E11319 Type 2 diabetes mellitus with unspecified diabetic retinopathy without macular edema: Secondary | ICD-10-CM

## 2014-10-19 NOTE — Patient Instructions (Addendum)
Dennis Wise will call about your referral. Recheck labs before a visit in about 4 months.   Take care.  Glad to see you.

## 2014-10-19 NOTE — Progress Notes (Signed)
Pre visit review using our clinic review tool, if applicable. No additional management support is needed unless otherwise documented below in the visit note.  I have personally reviewed the Medicare Annual Wellness questionnaire and have noted 1. The patient's medical and social history 2. Their use of alcohol, tobacco or illicit drugs 3. Their current medications and supplements 4. The patient's functional ability including ADL's, fall risks, home safety risks and hearing or visual             impairment. 5. Diet and physical activities 6. Evidence for depression or mood disorders  The patients weight, height, BMI have been recorded in the chart and visual acuity is per eye clinic.  I have made referrals, counseling and provided education to the patient based review of the above and I have provided the pt with a written personalized care plan for preventive services.  Provider list updated- see scanned forms.  Routine anticipatory guidance given to patient.  See health maintenance.  Flu encouraged Shingles prev done PNA up to date Tetanus 2011 Colon  2014 Prostate cancer screening and PSA options (with potential risks and benefits of testing vs not testing) were discussed along with recent recs/guidelines.  He declined testing PSA at this point. Advance directive- wife designated if patient were incapacitated.   Cognitive function addressed- see scanned forms- and if abnormal then additional documentation follows.   Diabetes:  Using medications without difficulties: yes Hypoglycemic episodes:no Hyperglycemic episodes:no Feet problems: no tingling now.  Improved with lower sugar Blood Sugars averaging: ~100-150 Eye exam within last year: yes A1c improved.  D/w pt.  Diet and exercise d/w pt.   Hypertension:    Using medication without problems or lightheadedness: yes Chest pain with exertion:no Edema:no Short of breath:no  Elevated Cholesterol: Using medications without  problems: yes Muscle aches: no Diet compliance: yes Exercise: encouraged, doing farm work.   PMH and SH reviewed.   Vital signs, Meds and allergies reviewed.  ROS: See HPI.  Otherwise nontributory.   GEN: nad, alert and oriented HEENT: mucous membranes moist NECK: supple w/o LA CV: rrr. PULM: ctab, no inc wob ABD: soft, +bs EXT: no edema SKIN: no acute rash  Diabetic foot exam: Normal inspection No skin breakdown except for small ruptured blister on the R4th toe, shallow and not infected.  No calluses  Normal DP pulses Normal sensation to light tough and monofilament Nails normal

## 2014-10-20 DIAGNOSIS — H9190 Unspecified hearing loss, unspecified ear: Secondary | ICD-10-CM

## 2014-10-20 HISTORY — DX: Unspecified hearing loss, unspecified ear: H91.90

## 2014-10-20 NOTE — Assessment & Plan Note (Signed)
Flu encouraged  Shingles prev done  PNA up to date  Tetanus 2011  Colon 2014  Prostate cancer screening and PSA options (with potential risks and benefits of testing vs not testing) were discussed along with recent recs/guidelines. He declined testing PSA at this point.  Advance directive- wife designated if patient were incapacitated.  Cognitive function addressed- see scanned forms- and if abnormal then additional documentation follows.

## 2014-10-20 NOTE — Assessment & Plan Note (Signed)
Controlled, continue diet and exercise, no change in meds.  He agrees.  Labs d/w pt.

## 2014-10-20 NOTE — Assessment & Plan Note (Signed)
D/w pt.  Refer.

## 2014-10-20 NOTE — Assessment & Plan Note (Signed)
Controlled, continue diet and exercise, no change in meds.  He agrees.

## 2014-10-20 NOTE — Assessment & Plan Note (Signed)
A1c some better but still >7, continue as is with meds.  Needs work on diet and exercise still.  Weight has always been around current level, even when in the [employer] per patient report.  The blister should heal well.  F/u prn about blister. Local care only.  Recheck labs in fall 2016.

## 2014-11-06 DIAGNOSIS — E11351 Type 2 diabetes mellitus with proliferative diabetic retinopathy with macular edema: Secondary | ICD-10-CM | POA: Diagnosis not present

## 2014-11-06 DIAGNOSIS — E11319 Type 2 diabetes mellitus with unspecified diabetic retinopathy without macular edema: Secondary | ICD-10-CM | POA: Diagnosis not present

## 2014-11-06 DIAGNOSIS — E11311 Type 2 diabetes mellitus with unspecified diabetic retinopathy with macular edema: Secondary | ICD-10-CM | POA: Diagnosis not present

## 2014-11-06 DIAGNOSIS — D472 Monoclonal gammopathy: Secondary | ICD-10-CM | POA: Diagnosis not present

## 2014-11-06 DIAGNOSIS — H2513 Age-related nuclear cataract, bilateral: Secondary | ICD-10-CM | POA: Diagnosis not present

## 2014-11-06 DIAGNOSIS — I10 Essential (primary) hypertension: Secondary | ICD-10-CM | POA: Diagnosis not present

## 2014-11-06 DIAGNOSIS — H4311 Vitreous hemorrhage, right eye: Secondary | ICD-10-CM | POA: Diagnosis not present

## 2014-11-06 DIAGNOSIS — Z7982 Long term (current) use of aspirin: Secondary | ICD-10-CM | POA: Diagnosis not present

## 2014-11-06 DIAGNOSIS — H269 Unspecified cataract: Secondary | ICD-10-CM | POA: Diagnosis not present

## 2014-11-13 ENCOUNTER — Other Ambulatory Visit: Payer: Self-pay | Admitting: Otolaryngology

## 2014-11-13 DIAGNOSIS — T162XXA Foreign body in left ear, initial encounter: Secondary | ICD-10-CM | POA: Diagnosis not present

## 2014-11-13 DIAGNOSIS — H903 Sensorineural hearing loss, bilateral: Secondary | ICD-10-CM | POA: Diagnosis not present

## 2014-11-13 DIAGNOSIS — IMO0001 Reserved for inherently not codable concepts without codable children: Secondary | ICD-10-CM

## 2014-11-13 DIAGNOSIS — H918X1 Other specified hearing loss, right ear: Secondary | ICD-10-CM

## 2014-11-22 ENCOUNTER — Ambulatory Visit
Admission: RE | Admit: 2014-11-22 | Discharge: 2014-11-22 | Disposition: A | Discharge status: Home / Self Care | Payer: Commercial Managed Care - HMO | Source: Ambulatory Visit | Attending: Otolaryngology | Admitting: Otolaryngology

## 2014-11-22 DIAGNOSIS — H918X1 Other specified hearing loss, right ear: Secondary | ICD-10-CM | POA: Diagnosis not present

## 2014-11-22 DIAGNOSIS — H905 Unspecified sensorineural hearing loss: Secondary | ICD-10-CM | POA: Diagnosis not present

## 2014-11-22 DIAGNOSIS — IMO0001 Reserved for inherently not codable concepts without codable children: Secondary | ICD-10-CM

## 2014-11-22 MED ORDER — GADOBENATE DIMEGLUMINE 529 MG/ML IV SOLN
20.0000 mL | Freq: Once | INTRAVENOUS | Status: AC | PRN
  Administered 2014-11-22: 20 mL via INTRAVENOUS

## 2014-12-11 DIAGNOSIS — Z7982 Long term (current) use of aspirin: Secondary | ICD-10-CM | POA: Diagnosis not present

## 2014-12-11 DIAGNOSIS — E11351 Type 2 diabetes mellitus with proliferative diabetic retinopathy with macular edema: Secondary | ICD-10-CM | POA: Diagnosis not present

## 2014-12-11 DIAGNOSIS — H4311 Vitreous hemorrhage, right eye: Secondary | ICD-10-CM | POA: Diagnosis not present

## 2014-12-11 DIAGNOSIS — D472 Monoclonal gammopathy: Secondary | ICD-10-CM | POA: Diagnosis not present

## 2014-12-11 DIAGNOSIS — E11319 Type 2 diabetes mellitus with unspecified diabetic retinopathy without macular edema: Secondary | ICD-10-CM | POA: Diagnosis not present

## 2014-12-11 DIAGNOSIS — E11311 Type 2 diabetes mellitus with unspecified diabetic retinopathy with macular edema: Secondary | ICD-10-CM | POA: Diagnosis not present

## 2014-12-11 DIAGNOSIS — I10 Essential (primary) hypertension: Secondary | ICD-10-CM | POA: Diagnosis not present

## 2014-12-11 DIAGNOSIS — H2513 Age-related nuclear cataract, bilateral: Secondary | ICD-10-CM | POA: Diagnosis not present

## 2014-12-11 DIAGNOSIS — E11359 Type 2 diabetes mellitus with proliferative diabetic retinopathy without macular edema: Secondary | ICD-10-CM | POA: Diagnosis not present

## 2014-12-11 DIAGNOSIS — H269 Unspecified cataract: Secondary | ICD-10-CM | POA: Diagnosis not present

## 2015-01-08 ENCOUNTER — Encounter: Payer: Self-pay | Admitting: Family Medicine

## 2015-01-08 ENCOUNTER — Telehealth: Payer: Self-pay | Admitting: Hematology and Oncology

## 2015-01-08 NOTE — Telephone Encounter (Signed)
Pt's wife called to r/s Sept labs/ov due to pt has other apts and traveling, confirmed labs/ov for next visit 10/10... KJ

## 2015-01-10 LAB — HM DIABETES EYE EXAM

## 2015-01-16 ENCOUNTER — Ambulatory Visit: Payer: Commercial Managed Care - HMO | Admitting: Hematology and Oncology

## 2015-01-16 ENCOUNTER — Other Ambulatory Visit: Payer: Commercial Managed Care - HMO

## 2015-01-24 DIAGNOSIS — H269 Unspecified cataract: Secondary | ICD-10-CM | POA: Diagnosis not present

## 2015-01-24 DIAGNOSIS — Z794 Long term (current) use of insulin: Secondary | ICD-10-CM | POA: Diagnosis not present

## 2015-01-24 DIAGNOSIS — D472 Monoclonal gammopathy: Secondary | ICD-10-CM | POA: Diagnosis not present

## 2015-01-24 DIAGNOSIS — I10 Essential (primary) hypertension: Secondary | ICD-10-CM | POA: Diagnosis not present

## 2015-01-24 DIAGNOSIS — E113513 Type 2 diabetes mellitus with proliferative diabetic retinopathy with macular edema, bilateral: Secondary | ICD-10-CM | POA: Diagnosis not present

## 2015-01-24 DIAGNOSIS — E11319 Type 2 diabetes mellitus with unspecified diabetic retinopathy without macular edema: Secondary | ICD-10-CM | POA: Diagnosis not present

## 2015-01-24 DIAGNOSIS — E11311 Type 2 diabetes mellitus with unspecified diabetic retinopathy with macular edema: Secondary | ICD-10-CM | POA: Diagnosis not present

## 2015-01-28 NOTE — Assessment & Plan Note (Signed)
#  1 MGUS bone marrow biopsy and aspirate that revealed 3% plasma cells in the bone marrow  #2 thrombocytopenia: Likely ITP Previously given IV Iron  Blood work done today was reviewed RTC in 1 year with labs

## 2015-01-29 ENCOUNTER — Ambulatory Visit (HOSPITAL_BASED_OUTPATIENT_CLINIC_OR_DEPARTMENT_OTHER): Payer: Commercial Managed Care - HMO

## 2015-01-29 ENCOUNTER — Encounter: Payer: Self-pay | Admitting: Hematology and Oncology

## 2015-01-29 ENCOUNTER — Ambulatory Visit (HOSPITAL_BASED_OUTPATIENT_CLINIC_OR_DEPARTMENT_OTHER): Payer: Commercial Managed Care - HMO | Admitting: Hematology and Oncology

## 2015-01-29 ENCOUNTER — Telehealth: Payer: Self-pay | Admitting: Hematology and Oncology

## 2015-01-29 ENCOUNTER — Other Ambulatory Visit (HOSPITAL_BASED_OUTPATIENT_CLINIC_OR_DEPARTMENT_OTHER): Payer: Commercial Managed Care - HMO

## 2015-01-29 VITALS — BP 167/61 | HR 51 | Temp 98.8°F | Resp 20 | Ht 74.0 in | Wt 303.1 lb

## 2015-01-29 DIAGNOSIS — D696 Thrombocytopenia, unspecified: Secondary | ICD-10-CM

## 2015-01-29 DIAGNOSIS — D61818 Other pancytopenia: Secondary | ICD-10-CM

## 2015-01-29 DIAGNOSIS — D472 Monoclonal gammopathy: Secondary | ICD-10-CM

## 2015-01-29 LAB — RETICULOCYTES
Immature Retic Fract: 14.3 % — ABNORMAL HIGH (ref 3.00–10.60)
RBC: 3.76 10*6/uL — ABNORMAL LOW (ref 4.20–5.82)
Retic %: 3.18 % — ABNORMAL HIGH (ref 0.80–1.80)
Retic Ct Abs: 119.57 10*3/uL — ABNORMAL HIGH (ref 34.80–93.90)

## 2015-01-29 LAB — CBC WITH DIFFERENTIAL/PLATELET
BASO%: 0.4 % (ref 0.0–2.0)
BASOS ABS: 0 10*3/uL (ref 0.0–0.1)
EOS%: 3.5 % (ref 0.0–7.0)
Eosinophils Absolute: 0.2 10*3/uL (ref 0.0–0.5)
HEMATOCRIT: 33.1 % — AB (ref 38.4–49.9)
HGB: 11.2 g/dL — ABNORMAL LOW (ref 13.0–17.1)
LYMPH#: 1.3 10*3/uL (ref 0.9–3.3)
LYMPH%: 26.8 % (ref 14.0–49.0)
MCH: 29.6 pg (ref 27.2–33.4)
MCHC: 33.8 g/dL (ref 32.0–36.0)
MCV: 87.6 fL (ref 79.3–98.0)
MONO#: 0.5 10*3/uL (ref 0.1–0.9)
MONO%: 9.5 % (ref 0.0–14.0)
NEUT#: 2.9 10*3/uL (ref 1.5–6.5)
NEUT%: 59.8 % (ref 39.0–75.0)
Platelets: 97 10*3/uL — ABNORMAL LOW (ref 140–400)
RBC: 3.78 10*6/uL — ABNORMAL LOW (ref 4.20–5.82)
RDW: 13.4 % (ref 11.0–14.6)
WBC: 4.8 10*3/uL (ref 4.0–10.3)
nRBC: 0 % (ref 0–0)

## 2015-01-29 LAB — COMPREHENSIVE METABOLIC PANEL (CC13)
ALBUMIN: 3.8 g/dL (ref 3.5–5.0)
ALK PHOS: 87 U/L (ref 40–150)
ALT: 17 U/L (ref 0–55)
AST: 16 U/L (ref 5–34)
Anion Gap: 7 mEq/L (ref 3–11)
BUN: 22.7 mg/dL (ref 7.0–26.0)
CO2: 27 mEq/L (ref 22–29)
Calcium: 9.1 mg/dL (ref 8.4–10.4)
Chloride: 107 mEq/L (ref 98–109)
Creatinine: 1 mg/dL (ref 0.7–1.3)
EGFR: 75 mL/min/{1.73_m2} — ABNORMAL LOW (ref >90)
GLUCOSE: 161 mg/dL — AB (ref 70–140)
POTASSIUM: 4.3 meq/L (ref 3.5–5.1)
Sodium: 140 mEq/L (ref 136–145)
Total Bilirubin: 0.68 mg/dL (ref 0.20–1.20)
Total Protein: 6.4 g/dL (ref 6.4–8.3)

## 2015-01-29 NOTE — Telephone Encounter (Signed)
Appointments made and avs pritned for pateint °

## 2015-01-29 NOTE — Progress Notes (Signed)
Patient Care Team: [name], MD as PCP - General (Family Medicine)  DIAGNOSIS: MGUS  CHIEF COMPLIANT: Follow-up of MGUS and anemia  INTERVAL HISTORY: Dennis Wise is a 64 year old gentleman with above-mentioned history of MGUS who is here for annual follow-up. He reports no new problems or concerns. 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 He takes aspirin and bleeds relatively easily. He denies any bleeding through his bowels. Denies any cough or hematemesis or hemoptysis. He has gained significant weight and is planning to lose some of his weight going forward.  REVIEW OF SYSTEMS:   Constitutional: Denies fevers, chills or abnormal weight loss Eyes: Denies blurriness of vision Ears, nose, mouth, throat, and face: Denies mucositis or sore throat Respiratory: Denies cough, dyspnea or wheezes Cardiovascular: Denies palpitation, chest discomfort or lower extremity swelling Gastrointestinal:  Denies nausea, heartburn or change in bowel habits Skin: Denies abnormal skin rashes Lymphatics: Denies new lymphadenopathy. Complains of easy bruising from being on aspirin. Neurological:Denies numbness, tingling or new weaknesses Behavioral/Psych: Mood is stable, no new changes  All other systems were reviewed with the patient and are negative.  I have reviewed the past medical history, past surgical history, social history and family history with the patient and they are unchanged from previous note.  ALLERGIES:  is allergic to promethazine hcl.  MEDICATIONS:  Current Outpatient Prescriptions  Medication Sig Dispense Refill  . aspirin 81 MG tablet Take 81 mg by mouth daily.      Marland Kitchen atorvastatin (LIPITOR) 10 MG tablet TAKE 1 TABLET AT BEDTIME 90 tablet 3  . glimepiride (AMARYL) 2 MG tablet Take 1 tablet (2 mg total) by mouth daily before breakfast. 90 tablet 3  . metFORMIN (GLUCOPHAGE) 850 MG tablet TAKE 1 TABLET TWICE DAILY 180 tablet 3  . Multiple Vitamin  (MULTIVITAMIN) tablet Take 1 tablet by mouth daily.      . niacin (NIASPAN) 500 MG CR tablet TAKE 3 TABLETS AT BEDTIME 270 tablet 2  . pioglitazone (ACTOS) 45 MG tablet TAKE 1 TABLET DAILY 90 tablet 3  . ramipril (ALTACE) 10 MG capsule TAKE 1 CAPSULE DAILY 90 capsule 3   No current facility-administered medications for this visit.    PHYSICAL EXAMINATION: ECOG PERFORMANCE STATUS: 1 - Symptomatic but completely ambulatory  Filed Vitals:   01/29/15 0923  BP: 167/61  Pulse: 51  Temp: 98.8 F (37.1 C)  Resp: 20   Filed Weights   01/29/15 0923  Weight: 303 lb 1.6 oz (137.485 kg)    GENERAL:alert, no distress and comfortable SKIN: skin color, texture, turgor are normal, no rashes or significant lesions EYES: normal, Conjunctiva are pink and non-injected, sclera clear OROPHARYNX:no exudate, no erythema and lips, buccal mucosa, and tongue normal  NECK: supple, thyroid normal size, non-tender, without nodularity LYMPH:  no palpable lymphadenopathy in the cervical, axillary or inguinal LUNGS: clear to auscultation and percussion with normal breathing effort HEART: regular rate & rhythm and no murmurs and no lower extremity edema ABDOMEN:abdomen soft, non-tender and normal bowel sounds Musculoskeletal:no cyanosis of digits and no clubbing  NEURO: alert & oriented x 3 with fluent speech, no focal motor/sensory deficits   LABORATORY DATA:  I have reviewed the data as listed   Chemistry      Component Value Date/Time   NA 135 10/16/2014 0833   NA 140 01/17/2014 1326   K 4.8 10/16/2014 0833   K 4.8 01/17/2014 1326   CL 102 10/16/2014 0833   CL 103  08/30/2012 0901   CO2 29 10/16/2014 0833   CO2 29 01/17/2014 1326   BUN 26* 10/16/2014 0833   BUN 20.6 01/17/2014 1326   CREATININE 1.24 10/16/2014 0833   CREATININE 1.1 01/17/2014 1326      Component Value Date/Time   CALCIUM 9.1 10/16/2014 0833   CALCIUM 9.5 01/17/2014 1326   ALKPHOS 89 10/16/2014 0833   ALKPHOS 96 01/17/2014  1326   AST 15 10/16/2014 0833   AST 16 01/17/2014 1326   ALT 12 10/16/2014 0833   ALT 12 01/17/2014 1326   BILITOT 0.9 10/16/2014 0833   BILITOT 0.65 01/17/2014 1326       Lab Results  Component Value Date   WBC 4.8 01/29/2015   HGB 11.2* 01/29/2015   HCT 33.1* 01/29/2015   MCV 87.6 01/29/2015   PLT 97* 01/29/2015   NEUTROABS 2.9 01/29/2015   ASSESSMENT & PLAN:  Pancytopenia, acquired #1 MGUS bone marrow biopsy and aspirate that revealed 3% plasma cells in the bone marrow  #2 thrombocytopenia: Likely ITP Previously given IV Iron  Blood work done today was reviewed and CBC was 11.2. Platelets of 97. He did not have blood work for serum protein electrophoresis. I will send him back to the lab to do this test. I will have to call him with the results of this test.  He states extremely busy working as a Radiation protection practitioner for a lot of the [employer] along with you [place] open and a few other gallstone limits. He has gained significant weight. I discussed with him that weight loss is necessary to preserve his health.  RTC in 1 year with labs   No orders of the defined types were placed in this encounter.   The patient has a good understanding of the overall plan. he agrees with it. he will call with any problems that may develop before the next visit here.   Rulon Eisenmenger, MD

## 2015-01-31 LAB — KAPPA/LAMBDA LIGHT CHAINS
KAPPA LAMBDA RATIO: 1.6 (ref 0.26–1.65)
Kappa free light chain: 2.11 mg/dL — ABNORMAL HIGH (ref 0.33–1.94)
Lambda Free Lght Chn: 1.32 mg/dL (ref 0.57–2.63)

## 2015-01-31 LAB — PROTEIN ELECTROPHORESIS, SERUM
Albumin ELP: 4 g/dL (ref 3.8–4.8)
Alpha-1-Globulin: 0.3 g/dL (ref 0.2–0.3)
Alpha-2-Globulin: 0.6 g/dL (ref 0.5–0.9)
Beta 2: 0.3 g/dL (ref 0.2–0.5)
Beta Globulin: 0.4 g/dL (ref 0.4–0.6)
Gamma Globulin: 0.8 g/dL (ref 0.8–1.7)
TOTAL PROTEIN, SERUM ELECTROPHOR: 6.4 g/dL (ref 6.1–8.1)

## 2015-02-11 ENCOUNTER — Other Ambulatory Visit: Payer: Self-pay | Admitting: Family Medicine

## 2015-02-11 DIAGNOSIS — E113299 Type 2 diabetes mellitus with mild nonproliferative diabetic retinopathy without macular edema, unspecified eye: Secondary | ICD-10-CM

## 2015-02-14 ENCOUNTER — Other Ambulatory Visit (INDEPENDENT_AMBULATORY_CARE_PROVIDER_SITE_OTHER): Payer: Commercial Managed Care - HMO

## 2015-02-14 DIAGNOSIS — E113299 Type 2 diabetes mellitus with mild nonproliferative diabetic retinopathy without macular edema, unspecified eye: Secondary | ICD-10-CM | POA: Diagnosis not present

## 2015-02-14 LAB — HEMOGLOBIN A1C: Hgb A1c MFr Bld: 6 % (ref 4.6–6.5)

## 2015-02-20 ENCOUNTER — Encounter: Payer: Self-pay | Admitting: Family Medicine

## 2015-02-20 ENCOUNTER — Ambulatory Visit (INDEPENDENT_AMBULATORY_CARE_PROVIDER_SITE_OTHER): Payer: Commercial Managed Care - HMO | Admitting: Family Medicine

## 2015-02-20 VITALS — BP 136/66 | HR 58 | Temp 98.3°F | Wt 287.2 lb

## 2015-02-20 DIAGNOSIS — Z119 Encounter for screening for infectious and parasitic diseases, unspecified: Secondary | ICD-10-CM

## 2015-02-20 DIAGNOSIS — Z23 Encounter for immunization: Secondary | ICD-10-CM | POA: Diagnosis not present

## 2015-02-20 DIAGNOSIS — E113299 Type 2 diabetes mellitus with mild nonproliferative diabetic retinopathy without macular edema, unspecified eye: Secondary | ICD-10-CM | POA: Diagnosis not present

## 2015-02-20 NOTE — Patient Instructions (Signed)
Recheck labs before a physical next summer.  Take care.  Glad to see you.  Don't change your meds for now.  If you have low sugars, then let me know.

## 2015-02-20 NOTE — Progress Notes (Signed)
Pre visit review using our clinic review tool, if applicable. No additional management support is needed unless otherwise documented below in the visit note.  Diabetes:  Using medications without difficulties: yes Hypoglycemic episodes:no Hyperglycemic episodes:no Feet problems:no Blood Sugars averaging: usually ~110s eye exam within last year: yes, every 6 weeks at the eye clinic at [facility].   A1c 6.  D/w pt.   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   He had been working on diet and weight in the meantime.    Pt opts in for HCV screening.  D/w pt re: routine screening.    Meds, vitals, and allergies reviewed.   ROS: See HPI.  Otherwise negative.    GEN: nad, alert and oriented HEENT: mucous membranes moist NECK: supple w/o LA CV: rrr. PULM: ctab, no inc wob ABD: soft, +bs EXT: no edema  Diabetic foot exam: Normal inspection No skin breakdown No calluses  Normal DP pulses Normal sensation to light touch but dec in monofilament on L foot (not absence of sensation but decreased) Nails normal

## 2015-02-21 NOTE — Assessment & Plan Note (Signed)
Has f/u with the eye clinic pending.  Foot exam with dec in sensation noted, but skin okay o/w.  Recheck labs before a physical next summer.  No med change for now. If having low sugars, then he'll let me know.  Update me as needed.  A1c improved.

## 2015-03-12 DIAGNOSIS — H2513 Age-related nuclear cataract, bilateral: Secondary | ICD-10-CM | POA: Diagnosis not present

## 2015-03-12 DIAGNOSIS — Z09 Encounter for follow-up examination after completed treatment for conditions other than malignant neoplasm: Secondary | ICD-10-CM | POA: Diagnosis not present

## 2015-03-12 DIAGNOSIS — E113513 Type 2 diabetes mellitus with proliferative diabetic retinopathy with macular edema, bilateral: Secondary | ICD-10-CM | POA: Diagnosis not present

## 2015-03-12 DIAGNOSIS — Z79899 Other long term (current) drug therapy: Secondary | ICD-10-CM | POA: Diagnosis not present

## 2015-03-12 DIAGNOSIS — Z7984 Long term (current) use of oral hypoglycemic drugs: Secondary | ICD-10-CM | POA: Diagnosis not present

## 2015-03-12 DIAGNOSIS — I10 Essential (primary) hypertension: Secondary | ICD-10-CM | POA: Diagnosis not present

## 2015-05-28 DIAGNOSIS — I10 Essential (primary) hypertension: Secondary | ICD-10-CM | POA: Diagnosis not present

## 2015-05-28 DIAGNOSIS — Z7984 Long term (current) use of oral hypoglycemic drugs: Secondary | ICD-10-CM | POA: Diagnosis not present

## 2015-05-28 DIAGNOSIS — D472 Monoclonal gammopathy: Secondary | ICD-10-CM | POA: Diagnosis not present

## 2015-05-28 DIAGNOSIS — E113513 Type 2 diabetes mellitus with proliferative diabetic retinopathy with macular edema, bilateral: Secondary | ICD-10-CM | POA: Diagnosis not present

## 2015-05-28 DIAGNOSIS — E11311 Type 2 diabetes mellitus with unspecified diabetic retinopathy with macular edema: Secondary | ICD-10-CM | POA: Diagnosis not present

## 2015-05-28 DIAGNOSIS — H4311 Vitreous hemorrhage, right eye: Secondary | ICD-10-CM | POA: Diagnosis not present

## 2015-07-03 ENCOUNTER — Encounter: Payer: Self-pay | Admitting: Family Medicine

## 2015-07-09 DIAGNOSIS — I10 Essential (primary) hypertension: Secondary | ICD-10-CM | POA: Diagnosis not present

## 2015-07-09 DIAGNOSIS — Z794 Long term (current) use of insulin: Secondary | ICD-10-CM | POA: Diagnosis not present

## 2015-07-09 DIAGNOSIS — E113513 Type 2 diabetes mellitus with proliferative diabetic retinopathy with macular edema, bilateral: Secondary | ICD-10-CM | POA: Diagnosis not present

## 2015-07-09 DIAGNOSIS — Z7982 Long term (current) use of aspirin: Secondary | ICD-10-CM | POA: Diagnosis not present

## 2015-07-09 DIAGNOSIS — H2513 Age-related nuclear cataract, bilateral: Secondary | ICD-10-CM | POA: Diagnosis not present

## 2015-07-09 DIAGNOSIS — Z7984 Long term (current) use of oral hypoglycemic drugs: Secondary | ICD-10-CM | POA: Diagnosis not present

## 2015-07-09 DIAGNOSIS — E11311 Type 2 diabetes mellitus with unspecified diabetic retinopathy with macular edema: Secondary | ICD-10-CM | POA: Diagnosis not present

## 2015-07-09 DIAGNOSIS — D472 Monoclonal gammopathy: Secondary | ICD-10-CM | POA: Diagnosis not present

## 2015-07-10 ENCOUNTER — Other Ambulatory Visit: Payer: Self-pay | Admitting: Family Medicine

## 2015-07-31 ENCOUNTER — Encounter: Payer: Self-pay | Admitting: Family Medicine

## 2015-08-01 ENCOUNTER — Other Ambulatory Visit: Payer: Self-pay | Admitting: Family Medicine

## 2015-08-01 DIAGNOSIS — Z973 Presence of spectacles and contact lenses: Secondary | ICD-10-CM

## 2015-09-05 ENCOUNTER — Encounter: Payer: Self-pay | Admitting: Family Medicine

## 2015-09-06 ENCOUNTER — Other Ambulatory Visit: Payer: Self-pay | Admitting: Family Medicine

## 2015-09-06 DIAGNOSIS — E11319 Type 2 diabetes mellitus with unspecified diabetic retinopathy without macular edema: Secondary | ICD-10-CM

## 2015-09-10 DIAGNOSIS — E113513 Type 2 diabetes mellitus with proliferative diabetic retinopathy with macular edema, bilateral: Secondary | ICD-10-CM | POA: Diagnosis not present

## 2015-09-10 DIAGNOSIS — I10 Essential (primary) hypertension: Secondary | ICD-10-CM | POA: Diagnosis not present

## 2015-09-10 DIAGNOSIS — H2513 Age-related nuclear cataract, bilateral: Secondary | ICD-10-CM | POA: Diagnosis not present

## 2015-09-10 DIAGNOSIS — Z79899 Other long term (current) drug therapy: Secondary | ICD-10-CM | POA: Diagnosis not present

## 2015-09-10 DIAGNOSIS — Z7984 Long term (current) use of oral hypoglycemic drugs: Secondary | ICD-10-CM | POA: Diagnosis not present

## 2015-09-10 DIAGNOSIS — Z7982 Long term (current) use of aspirin: Secondary | ICD-10-CM | POA: Diagnosis not present

## 2015-09-21 DIAGNOSIS — D225 Melanocytic nevi of trunk: Secondary | ICD-10-CM | POA: Diagnosis not present

## 2015-09-21 DIAGNOSIS — L570 Actinic keratosis: Secondary | ICD-10-CM | POA: Diagnosis not present

## 2015-09-21 DIAGNOSIS — X32XXXA Exposure to sunlight, initial encounter: Secondary | ICD-10-CM | POA: Diagnosis not present

## 2015-09-21 DIAGNOSIS — Z85828 Personal history of other malignant neoplasm of skin: Secondary | ICD-10-CM | POA: Diagnosis not present

## 2015-09-21 DIAGNOSIS — D485 Neoplasm of uncertain behavior of skin: Secondary | ICD-10-CM | POA: Diagnosis not present

## 2015-09-21 DIAGNOSIS — Z08 Encounter for follow-up examination after completed treatment for malignant neoplasm: Secondary | ICD-10-CM | POA: Diagnosis not present

## 2015-09-21 DIAGNOSIS — C44629 Squamous cell carcinoma of skin of left upper limb, including shoulder: Secondary | ICD-10-CM | POA: Diagnosis not present

## 2015-10-14 ENCOUNTER — Other Ambulatory Visit: Payer: Self-pay | Admitting: Family Medicine

## 2015-10-14 DIAGNOSIS — E113299 Type 2 diabetes mellitus with mild nonproliferative diabetic retinopathy without macular edema, unspecified eye: Secondary | ICD-10-CM

## 2015-10-16 ENCOUNTER — Other Ambulatory Visit: Payer: Self-pay | Admitting: Family Medicine

## 2015-10-16 ENCOUNTER — Other Ambulatory Visit (INDEPENDENT_AMBULATORY_CARE_PROVIDER_SITE_OTHER): Payer: Commercial Managed Care - HMO

## 2015-10-16 DIAGNOSIS — E113299 Type 2 diabetes mellitus with mild nonproliferative diabetic retinopathy without macular edema, unspecified eye: Secondary | ICD-10-CM | POA: Diagnosis not present

## 2015-10-16 DIAGNOSIS — Z119 Encounter for screening for infectious and parasitic diseases, unspecified: Secondary | ICD-10-CM | POA: Diagnosis not present

## 2015-10-16 LAB — LIPID PANEL
CHOL/HDL RATIO: 3
CHOLESTEROL: 168 mg/dL (ref 0–200)
HDL: 51.6 mg/dL (ref >39.00)
NONHDL: 116.89
TRIGLYCERIDES: 201 mg/dL — AB (ref 0.0–149.0)
VLDL: 40.2 mg/dL — AB (ref 0.0–40.0)

## 2015-10-16 LAB — COMPREHENSIVE METABOLIC PANEL
ALBUMIN: 4.3 g/dL (ref 3.5–5.2)
ALT: 14 U/L (ref 0–53)
AST: 16 U/L (ref 0–37)
Alkaline Phosphatase: 90 U/L (ref 39–117)
BUN: 31 mg/dL — ABNORMAL HIGH (ref 6–23)
CO2: 29 meq/L (ref 19–32)
Calcium: 9.6 mg/dL (ref 8.4–10.5)
Chloride: 103 mEq/L (ref 96–112)
Creatinine, Ser: 1.13 mg/dL (ref 0.40–1.50)
GFR: 68.74 mL/min (ref >60.00)
Glucose, Bld: 131 mg/dL — ABNORMAL HIGH (ref 70–99)
POTASSIUM: 4.7 meq/L (ref 3.5–5.1)
Sodium: 136 mEq/L (ref 135–145)
Total Bilirubin: 0.6 mg/dL (ref 0.2–1.2)
Total Protein: 7.1 g/dL (ref 6.0–8.3)

## 2015-10-16 LAB — HEMOGLOBIN A1C: Hgb A1c MFr Bld: 6.8 % — ABNORMAL HIGH (ref 4.6–6.5)

## 2015-10-16 LAB — LDL CHOLESTEROL, DIRECT: Direct LDL: 74 mg/dL

## 2015-10-17 LAB — HEPATITIS C ANTIBODY: HCV Ab: NEGATIVE

## 2015-10-22 ENCOUNTER — Encounter: Payer: Self-pay | Admitting: Family Medicine

## 2015-10-22 ENCOUNTER — Ambulatory Visit (INDEPENDENT_AMBULATORY_CARE_PROVIDER_SITE_OTHER): Payer: Commercial Managed Care - HMO

## 2015-10-22 ENCOUNTER — Ambulatory Visit (INDEPENDENT_AMBULATORY_CARE_PROVIDER_SITE_OTHER): Payer: Commercial Managed Care - HMO | Admitting: Family Medicine

## 2015-10-22 VITALS — BP 132/60 | HR 71 | Temp 98.3°F | Ht 72.5 in | Wt 290.5 lb

## 2015-10-22 DIAGNOSIS — E7800 Pure hypercholesterolemia, unspecified: Secondary | ICD-10-CM

## 2015-10-22 DIAGNOSIS — E119 Type 2 diabetes mellitus without complications: Secondary | ICD-10-CM

## 2015-10-22 DIAGNOSIS — I10 Essential (primary) hypertension: Secondary | ICD-10-CM

## 2015-10-22 DIAGNOSIS — E113299 Type 2 diabetes mellitus with mild nonproliferative diabetic retinopathy without macular edema, unspecified eye: Secondary | ICD-10-CM

## 2015-10-22 DIAGNOSIS — Z0000 Encounter for general adult medical examination without abnormal findings: Secondary | ICD-10-CM

## 2015-10-22 MED ORDER — RAMIPRIL 5 MG PO CAPS: 5.0000 mg | ORAL_CAPSULE | Freq: Every day | ORAL | Status: DC

## 2015-10-22 NOTE — Patient Instructions (Signed)
Dennis Wise , Thank you for taking time to come for your Medicare Wellness Visit. I appreciate your ongoing commitment to your health goals. Please review the following plan we discussed and let me know if I can assist you in the future.   These are the goals we discussed: Goals    . 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        This is a list of the screening recommended for you and due dates:  Health Maintenance  Topic Date Due  . Flu Shot  11/20/2015  . Eye exam for diabetics  01/10/2016  . Complete foot exam   02/20/2016  . Pneumonia vaccines (2 of 2 - PPSV23) 03/02/2016  . Hemoglobin A1C  04/16/2016  . Colon Cancer Screening  04/07/2018  . Tetanus Vaccine  10/17/2019  . Shingles Vaccine  Completed  .  Hepatitis C: One time screening is recommended by Center for Disease Control  (CDC) for  adults born from [birth year] through 1967.   Completed    Preventive Care for Adults  A healthy lifestyle and preventive care can promote health and wellness. Preventive health guidelines for adults include the following key practices.  . A routine yearly physical is a good way to check with your health care provider about your health and preventive screening. It is a chance to share any concerns and updates on your health and to receive a thorough exam.  . Visit your dentist for a routine exam and preventive care every 6 months. Brush your teeth twice a day and floss once a day. Good oral hygiene prevents tooth decay and gum disease.  . The frequency of eye exams is based on your age, health, family medical history, use  of contact lenses, and other factors. Follow your health care provider's ecommendations for frequency of eye exams.  . Eat a healthy diet. Foods like vegetables, fruits, whole grains, low-fat dairy products, and lean protein foods contain the nutrients you need without too many  calories. Decrease your intake of foods high in solid fats, added sugars, and salt. Eat the right amount of calories for you. Get information about a proper diet from your health care provider, if necessary.  . Regular physical exercise is one of the most important things you can do for your health. Most adults should get at least 150 minutes of moderate-intensity exercise (any activity that increases your heart rate and causes you to sweat) each week. In addition, most adults need muscle-strengthening exercises on 2 or more days a week.  Silver Sneakers may be a benefit available to you. To determine eligibility, you may visit the website: www.silversneakers.com or contact program at [phone] Mon-Fri between 8AM-8PM.   . Maintain a healthy weight. The body mass index (BMI) is a screening tool to identify possible weight problems. It provides an estimate of body fat based on height and weight. Your health care provider can find your BMI and can help you achieve or maintain a healthy weight.   For adults 20 years and older: ? A BMI below 18.5 is considered underweight. ? A BMI of 18.5 to 24.9 is normal. ? A BMI of 25 to 29.9 is considered overweight. ? A BMI of 30 and above is considered obese.   . Maintain normal blood lipids and cholesterol levels by exercising and minimizing your intake of saturated fat. Eat  a balanced diet with plenty of fruit and vegetables. Blood tests for lipids and cholesterol should begin at age 44 and be repeated every 5 years. If your lipid or cholesterol levels are high, you are over 50, or you are at high risk for heart disease, you may need your cholesterol levels checked more frequently. Ongoing high lipid and cholesterol levels should be treated with medicines if diet and exercise are not working.  . If you smoke, find out from your health care provider how to quit. If you do not use tobacco, please do not start.  . If you choose to drink alcohol, please do  not consume more than 2 drinks per day. One drink is considered to be 12 ounces (355 mL) of beer, 5 ounces (148 mL) of wine, or 1.5 ounces (44 mL) of liquor.  . If you are 61-41 years old, ask your health care provider if you should take aspirin to prevent strokes.  . Use sunscreen. Apply sunscreen liberally and repeatedly throughout the day. You should seek shade when your shadow is shorter than you. Protect yourself by wearing long sleeves, pants, a wide-brimmed hat, and sunglasses year round, whenever you are outdoors.  . Once a month, do a whole body skin exam, using a mirror to look at the skin on your back. Tell your health care provider of new moles, moles that have irregular borders, moles that are larger than a pencil eraser, or moles that have changed in shape or color.

## 2015-10-22 NOTE — Progress Notes (Signed)
PCP notes  Health maintenance: No gaps identified or addressed  Abnormal screenings: None  Patient concerns: None  Nurse concerns: None  Next PCP appt: 10/22/15 @ 0845  I reviewed health advisor's note, was available for consultation on the day of service listed in this note, and agree with documentation and plan. Elsie Stain, MD.

## 2015-10-22 NOTE — Progress Notes (Signed)
Pre visit review using our clinic review tool, if applicable. No additional management support is needed unless otherwise documented below in the visit note.  Diabetes:  Using medications without difficulties: yes Hypoglycemic episodes:no Hyperglycemic episodes:no Feet problems: tingling at baseline prev but less now, no sx now.   Blood Sugars averaging: usually <150 in the AM eye exam within last year:yes Labs dw pt.   Diet had been off recently.  Likely affected his A1c.  D/w pt.   A1c still at goal but up from prev.    Hypertension:    Using medication without problems or lightheadedness: no-he'll get lightheaded when getting up.   Chest pain with exertion:no Edema:no Short of breath:no No vertigo sx.    Elevated Cholesterol: Using medications without problems:yes Muscle aches: no Diet compliance: yes Exercise: encouraged.  Labs d/w pt.    Prostate cancer screening and PSA options (with potential risks and benefits of testing vs not testing) were discussed along with recent recs/guidelines.  He declined testing PSA at this point.  PMH and SH reviewed.   Vital signs, Meds and allergies reviewed.  ROS: Per HPI unless specifically indicated in ROS section   GEN: nad, alert and oriented HEENT: mucous membranes moist NECK: supple w/o LA CV: rrr PULM: ctab, no inc wob ABD: soft, +bs EXT: no edema SKIN: no acute rash  Diabetic foot exam: Normal inspection No skin breakdown No calluses  Normal DP pulses Normal sensation to light tough and monofilament on R foot, dec sensation to monofilament on the L foot Nails normal

## 2015-10-22 NOTE — Progress Notes (Signed)
Subjective:   Dennis Wise is a 65 y.o. male who presents for Medicare Annual/Subsequent preventive examination.  Review of Systems:  N/A Cardiac Risk Factors include: advanced age (>79men, >17 women);obesity (BMI >30kg/m2);diabetes mellitus;dyslipidemia;hypertension;male gender     Objective:    Vitals: BP 132/60 mmHg  Pulse 71  Temp(Src) 98.3 F (36.8 C) (Oral)  Ht 6' 0.5" (1.842 m)  Wt 290 lb 8 oz (131.77 kg)  BMI 38.84 kg/m2  SpO2 98%  Body mass index is 38.84 kg/(m^2).  Tobacco History  Smoking status  . Never Smoker   Smokeless tobacco  . Never Used     Counseling given: No   Past Medical History  Diagnosis Date  . GERD (gastroesophageal reflux disease) 10/90  . Hypertension 08/01  . Hyperlipidemia   . Internal hemorrhoids   . Anemia     Iron deficiency part of this  . Pneumonia   . Pancytopenia   . Diabetes mellitus     type II  . Personal history of colonic adenomas 06/05/2012  . Helicobacter pylori gastritis 06/11/2011    On EGD 05/2011   . Iron deficiency anemia, unspecified 03/04/2011  . MGUS (monoclonal gammopathy of unknown significance)     possible dx initially, had negative f/u   Past Surgical History  Procedure Laterality Date  . Knee surgery  09/04    lt knee fx repair ORIF  . Tibia fracture surgery      left 2012  . Flexible sigmoidoscopy  05/1988    internal hemorrhoids   Family History  Problem Relation Age of Onset  . Skin cancer Brother   . Diabetes Brother   . Emphysema Father   . Cancer Father     ?  Marland Kitchen Alzheimer's disease Father   . Cancer Mother     ?  . Diabetes Mother   . Heart failure Mother   . Colon cancer Neg Hx   . Prostate cancer Neg Hx   . Esophageal cancer Neg Hx   . Rectal cancer Neg Hx   . Stomach cancer Neg Hx    History  Sexual Activity  . Sexual Activity: Yes    Outpatient Encounter Prescriptions as of 10/22/2015  Medication Sig  . aspirin 81 MG tablet Take 81 mg by mouth daily.    Marland Kitchen atorvastatin  (LIPITOR) 10 MG tablet TAKE 1 TABLET AT BEDTIME  . glimepiride (AMARYL) 2 MG tablet TAKE 1 TABLET DAILY BEFORE BREAKFAST.  . metFORMIN (GLUCOPHAGE) 850 MG tablet TAKE 1 TABLET TWICE DAILY  . Multiple Vitamin (MULTIVITAMIN) tablet Take 1 tablet by mouth daily.    . niacin (NIASPAN) 500 MG CR tablet TAKE 3 TABLETS AT BEDTIME  . pioglitazone (ACTOS) 45 MG tablet TAKE 1 TABLET EVERY DAY  . ramipril (ALTACE) 10 MG capsule TAKE 1 CAPSULE EVERY DAY   No facility-administered encounter medications on file as of 10/22/2015.    Activities of Daily Living In your present state of health, do you have any difficulty performing the following activities: 10/22/2015  Hearing? Y  Vision? N  Difficulty concentrating or making decisions? N  Walking or climbing stairs? N  Dressing or bathing? N  Doing errands, shopping? N  Preparing Food and eating ? N  Using the Toilet? N  In the past six months, have you accidently leaked urine? N  Do you have problems with loss of bowel control? N  Managing your Medications? N  Managing your Finances? N  Housekeeping or managing your Housekeeping? N  Patient Care Team: [name], MD as PCP - General (Family Medicine) Gearlean Alf, MD as Referring Physician (Ophthalmology) Nicholas Lose, MD as Consulting Physician (Hematology and Oncology)   Assessment:    Hearing Screening Comments: Audiology exam revealed hearing loss Vision Screening Comments: Last eye exam with Dr. Jenny Reichmann on 09/10/2015  Exercise Activities and Dietary recommendations Current Exercise Habits: The patient has a physically strenous job, but has no regular exercise apart from work., Exercise limited by: None identified  Goals    . 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       Fall Risk Fall Risk  10/22/2015 10/22/2015 10/19/2014 10/14/2013  Falls in the past year? No No No No    Depression Screen PHQ 2/9 Scores 10/22/2015 10/22/2015 10/19/2014 10/14/2013  PHQ - 2 Score 0 0 0 0    Cognitive Testing MMSE - Mini Mental State Exam 10/22/2015  Orientation to time 5  Orientation to Place 5  Registration 3  Attention/ Calculation 0  Recall 3  Language- name 2 objects 0  Language- repeat 1  Language- follow 3 step command 3  Language- read & follow direction 0  Write a sentence 0  Copy design 0  Total score 20   PLEASE NOTE: A Mini-Cog screen was completed. Maximum score is 20. A value of 0 denotes this part of Folstein MMSE was not completed or the patient failed this part of the Mini-Cog screening.   Mini-Cog Screening Orientation to Time - Max 5 pts Orientation to Place - Max 5 pts Registration - Max 3 pts Recall - Max 3 pts Language Repeat - Max 1 pts Language Follow 3 Step Command - Max 3 pts   Immunization History  Administered Date(s) Administered  . Influenza Split 03/03/2011, 02/03/2012  . Influenza,inj,Quad PF,36+ Mos 02/28/2013, 02/20/2015  . Pneumococcal Conjugate-13 10/14/2013  . Pneumococcal Polysaccharide-23 08/22/1998, 03/03/2011  . Td 08/22/1998, 10/16/2009  . Zoster 02/28/2013   Screening Tests Health Maintenance  Topic Date Due  . INFLUENZA VACCINE  11/20/2015  . OPHTHALMOLOGY EXAM  01/10/2016  . FOOT EXAM  02/20/2016  . PNA vac Low Risk Adult (2 of 2 - PPSV23) 03/02/2016  . HEMOGLOBIN A1C  04/16/2016  . COLONOSCOPY  04/07/2018  . TETANUS/TDAP  10/17/2019  . ZOSTAVAX  Completed  . Hepatitis C Screening  Completed      Plan:     I have personally reviewed and addressed the Medicare Annual Wellness questionnaire and have noted the following in the patient's chart:  A. Medical and social history B. Use of alcohol, tobacco or illicit drugs  C. Current medications and supplements D. Functional ability and status E.  Nutritional status F.  Physical activity G. Advance directives H. List of other physicians I.   Hospitalizations, surgeries, and ER visits in previous 12 months J.  [place] to include hearing, vision, cognitive, depression L. Referrals and appointments - none  In addition, I have reviewed and discussed with patient certain preventive protocols, quality metrics, and best practice recommendations. A written personalized care plan for preventive services as well as general preventive health recommendations were provided to patient.  See attached scanned questionnaire for additional information.   Signed,   Lindell Noe, MHA, BS, LPN Health Advisor

## 2015-10-22 NOTE — Progress Notes (Signed)
Pre visit review using our clinic review tool, if applicable. No additional management support is needed unless otherwise documented below in the visit note. 

## 2015-10-22 NOTE — Patient Instructions (Signed)
Change to 5mg  ramipril and see if the lightheaded troubles get better.   Update me as needed.  Recheck in about 4-5 months, A1c ahead of time.   Take care.  Glad to see you.

## 2015-10-24 NOTE — Assessment & Plan Note (Signed)
Change to 5mg  ramipril and see if the lightheaded troubles get better.   He'll update me as needed.

## 2015-10-24 NOTE — Assessment & Plan Note (Signed)
Continue statin, continue work on diet, d/w pt about labs.  He agrees.

## 2015-10-24 NOTE — Assessment & Plan Note (Signed)
Recheck in about 4-5 months, A1c ahead of time.   A1c up but still at goal now.  D/w pt about diet.  No change in meds. He agrees.

## 2015-11-12 DIAGNOSIS — H02839 Dermatochalasis of unspecified eye, unspecified eyelid: Secondary | ICD-10-CM | POA: Diagnosis not present

## 2015-11-12 DIAGNOSIS — Z79899 Other long term (current) drug therapy: Secondary | ICD-10-CM | POA: Diagnosis not present

## 2015-11-12 DIAGNOSIS — E113513 Type 2 diabetes mellitus with proliferative diabetic retinopathy with macular edema, bilateral: Secondary | ICD-10-CM | POA: Diagnosis not present

## 2015-11-12 DIAGNOSIS — H4311 Vitreous hemorrhage, right eye: Secondary | ICD-10-CM | POA: Diagnosis not present

## 2015-11-12 DIAGNOSIS — I10 Essential (primary) hypertension: Secondary | ICD-10-CM | POA: Diagnosis not present

## 2015-11-12 DIAGNOSIS — E11311 Type 2 diabetes mellitus with unspecified diabetic retinopathy with macular edema: Secondary | ICD-10-CM | POA: Diagnosis not present

## 2015-11-12 DIAGNOSIS — Z7982 Long term (current) use of aspirin: Secondary | ICD-10-CM | POA: Diagnosis not present

## 2015-11-12 DIAGNOSIS — Z7984 Long term (current) use of oral hypoglycemic drugs: Secondary | ICD-10-CM | POA: Diagnosis not present

## 2016-01-28 ENCOUNTER — Other Ambulatory Visit (HOSPITAL_BASED_OUTPATIENT_CLINIC_OR_DEPARTMENT_OTHER): Payer: Commercial Managed Care - HMO

## 2016-01-28 ENCOUNTER — Ambulatory Visit: Payer: Commercial Managed Care - HMO

## 2016-01-28 DIAGNOSIS — D472 Monoclonal gammopathy: Secondary | ICD-10-CM

## 2016-01-28 DIAGNOSIS — D509 Iron deficiency anemia, unspecified: Secondary | ICD-10-CM | POA: Diagnosis not present

## 2016-01-28 DIAGNOSIS — Z7982 Long term (current) use of aspirin: Secondary | ICD-10-CM | POA: Diagnosis not present

## 2016-01-28 DIAGNOSIS — H02839 Dermatochalasis of unspecified eye, unspecified eyelid: Secondary | ICD-10-CM | POA: Diagnosis not present

## 2016-01-28 DIAGNOSIS — Z7984 Long term (current) use of oral hypoglycemic drugs: Secondary | ICD-10-CM | POA: Diagnosis not present

## 2016-01-28 DIAGNOSIS — E113513 Type 2 diabetes mellitus with proliferative diabetic retinopathy with macular edema, bilateral: Secondary | ICD-10-CM | POA: Diagnosis not present

## 2016-01-28 DIAGNOSIS — E113511 Type 2 diabetes mellitus with proliferative diabetic retinopathy with macular edema, right eye: Secondary | ICD-10-CM | POA: Diagnosis not present

## 2016-01-28 DIAGNOSIS — I10 Essential (primary) hypertension: Secondary | ICD-10-CM | POA: Diagnosis not present

## 2016-01-28 LAB — CBC & DIFF AND RETIC
BASO%: 0.3 % (ref 0.0–2.0)
BASOS ABS: 0 10*3/uL (ref 0.0–0.1)
EOS%: 3.4 % (ref 0.0–7.0)
Eosinophils Absolute: 0.1 10*3/uL (ref 0.0–0.5)
HEMATOCRIT: 35.9 % — AB (ref 38.4–49.9)
HGB: 12.2 g/dL — ABNORMAL LOW (ref 13.0–17.1)
Immature Retic Fract: 10.1 % (ref 3.00–10.60)
LYMPH#: 1.1 10*3/uL (ref 0.9–3.3)
LYMPH%: 30 % (ref 14.0–49.0)
MCH: 29.5 pg (ref 27.2–33.4)
MCHC: 34 g/dL (ref 32.0–36.0)
MCV: 86.7 fL (ref 79.3–98.0)
MONO#: 0.4 10*3/uL (ref 0.1–0.9)
MONO%: 10.1 % (ref 0.0–14.0)
NEUT#: 2 10*3/uL (ref 1.5–6.5)
NEUT%: 56.2 % (ref 39.0–75.0)
PLATELETS: 106 10*3/uL — AB (ref 140–400)
RBC: 4.14 10*6/uL — AB (ref 4.20–5.82)
RDW: 13.9 % (ref 11.0–14.6)
RETIC CT ABS: 118.82 10*3/uL — AB (ref 34.80–93.90)
Retic %: 2.87 % — ABNORMAL HIGH (ref 0.80–1.80)
WBC: 3.6 10*3/uL — ABNORMAL LOW (ref 4.0–10.3)

## 2016-01-28 LAB — COMPREHENSIVE METABOLIC PANEL
ALBUMIN: 3.8 g/dL (ref 3.5–5.0)
ALK PHOS: 101 U/L (ref 40–150)
ALT: 14 U/L (ref 0–55)
AST: 15 U/L (ref 5–34)
Anion Gap: 11 mEq/L (ref 3–11)
BILIRUBIN TOTAL: 0.58 mg/dL (ref 0.20–1.20)
BUN: 23.4 mg/dL (ref 7.0–26.0)
CO2: 24 meq/L (ref 22–29)
CREATININE: 1.2 mg/dL (ref 0.7–1.3)
Calcium: 9.1 mg/dL (ref 8.4–10.4)
Chloride: 103 mEq/L (ref 98–109)
EGFR: 61 mL/min/{1.73_m2} — AB (ref >90)
GLUCOSE: 263 mg/dL — AB (ref 70–140)
Potassium: 4.7 mEq/L (ref 3.5–5.1)
SODIUM: 138 meq/L (ref 136–145)
TOTAL PROTEIN: 6.7 g/dL (ref 6.4–8.3)

## 2016-01-29 LAB — KAPPA/LAMBDA LIGHT CHAINS
IG KAPPA FREE LIGHT CHAIN: 21 mg/L — AB (ref 3.3–19.4)
Ig Lambda Free Light Chain: 14.6 mg/L (ref 5.7–26.3)
Kappa/Lambda FluidC Ratio: 1.44 (ref 0.26–1.65)

## 2016-01-30 DIAGNOSIS — C44629 Squamous cell carcinoma of skin of left upper limb, including shoulder: Secondary | ICD-10-CM | POA: Diagnosis not present

## 2016-01-30 LAB — PROTEIN ELECTROPHORESIS, SERUM
A/G Ratio: 1.3 (ref 0.7–1.7)
Albumin: 3.5 g/dL (ref 2.9–4.4)
Alpha 1: 0.2 g/dL (ref 0.0–0.4)
Alpha 2: 0.7 g/dL (ref 0.4–1.0)
Beta: 1 g/dL (ref 0.7–1.3)
Gamma Globulin: 0.8 g/dL (ref 0.4–1.8)
Globulin, Total: 2.7 g/dL (ref 2.2–3.9)
Total Protein: 6.2 g/dL (ref 6.0–8.5)

## 2016-02-04 ENCOUNTER — Encounter: Payer: Self-pay | Admitting: Hematology and Oncology

## 2016-02-04 ENCOUNTER — Ambulatory Visit (HOSPITAL_BASED_OUTPATIENT_CLINIC_OR_DEPARTMENT_OTHER): Payer: Commercial Managed Care - HMO | Admitting: Hematology and Oncology

## 2016-02-04 DIAGNOSIS — D472 Monoclonal gammopathy: Secondary | ICD-10-CM

## 2016-02-04 DIAGNOSIS — D696 Thrombocytopenia, unspecified: Secondary | ICD-10-CM

## 2016-02-04 NOTE — Progress Notes (Signed)
Patient Care Team: [name], MD as PCP - General (Family Medicine) Gearlean Alf, MD as Referring Physician (Ophthalmology) Nicholas Lose, MD as Consulting Physician (Hematology and Oncology)  DIAGNOSIS: MGUS  CHIEF COMPLIANT: Follow-up of MGUS and anemia  INTERVAL HISTORY: Dennis Wise is a 65 year old gentleman with above-mentioned history of MGUS who is here for annual follow-up. He reports no new problems or concerns. 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 He takes aspirin and bleeds relatively easily. He denies any bleeding through his bowels. Denies any cough or hematemesis or hemoptysis. He has gained significant weight and is planning to lose some of his weight going forward.  REVIEW OF SYSTEMS:   Constitutional: Denies fevers, chills or abnormal weight loss  Eyes: Denies blurriness of vision Ears, nose, mouth, throat, and face: Denies mucositis or sore throat Respiratory: Denies cough, dyspnea or wheezes Cardiovascular: Denies palpitation, chest discomfort Gastrointestinal:  Denies nausea, heartburn or change in bowel habits Skin: Denies abnormal skin rashes Lymphatics: Denies new lymphadenopathy or easy bruising Neurological:Denies numbness, tingling or new weaknesses Behavioral/Psych: Mood is stable, no new changes  Extremities: No lower extremity edema All other systems were reviewed with the patient and are negative.  I have reviewed the past medical history, past surgical history, social history and family history with the patient and they are unchanged from previous note.  ALLERGIES:  is allergic to promethazine hcl.  MEDICATIONS:  Current Outpatient Prescriptions  Medication Sig Dispense Refill  . aspirin 81 MG tablet Take 81 mg by mouth daily.      Marland Kitchen atorvastatin (LIPITOR) 10 MG tablet TAKE 1 TABLET AT BEDTIME 90 tablet 3  . glimepiride (AMARYL) 2 MG tablet TAKE 1 TABLET DAILY BEFORE BREAKFAST. 90 tablet 3  . metFORMIN  (GLUCOPHAGE) 850 MG tablet TAKE 1 TABLET TWICE DAILY 180 tablet 3  . Multiple Vitamin (MULTIVITAMIN) tablet Take 1 tablet by mouth daily.      . niacin (NIASPAN) 500 MG CR tablet TAKE 3 TABLETS AT BEDTIME 270 tablet 2  . pioglitazone (ACTOS) 45 MG tablet TAKE 1 TABLET EVERY DAY 90 tablet 3  . ramipril (ALTACE) 5 MG capsule Take 1 capsule (5 mg total) by mouth daily. 90 capsule 3   No current facility-administered medications for this visit.     PHYSICAL EXAMINATION: ECOG PERFORMANCE STATUS: 1 - Symptomatic but completely ambulatory  There were no vitals filed for this visit. There were no vitals filed for this visit.  GENERAL:alert, no distress and comfortable SKIN: skin color, texture, turgor are normal, no rashes or significant lesions EYES: normal, Conjunctiva are pink and non-injected, sclera clear OROPHARYNX:no exudate, no erythema and lips, buccal mucosa, and tongue normal  NECK: supple, thyroid normal size, non-tender, without nodularity LYMPH:  no palpable lymphadenopathy in the cervical, axillary or inguinal LUNGS: clear to auscultation and percussion with normal breathing effort HEART: regular rate & rhythm and no murmurs and no lower extremity edema ABDOMEN:abdomen soft, non-tender and normal bowel sounds MUSCULOSKELETAL:no cyanosis of digits and no clubbing  NEURO: alert & oriented x 3 with fluent speech, no focal motor/sensory deficits EXTREMITIES: No lower extremity edema  LABORATORY DATA:  I have reviewed the data as listed   Chemistry      Component Value Date/Time   NA 138 01/28/2016 0804   K 4.7 01/28/2016 0804   CL 103 10/16/2015 0814   CL 103 08/30/2012 0901   CO2 24 01/28/2016 0804   BUN 23.4 01/28/2016 0804  CREATININE 1.2 01/28/2016 0804      Component Value Date/Time   CALCIUM 9.1 01/28/2016 0804   ALKPHOS 101 01/28/2016 0804   AST 15 01/28/2016 0804   ALT 14 01/28/2016 0804   BILITOT 0.58 01/28/2016 0804       Lab Results  Component Value  Date   WBC 3.6 (L) 01/28/2016   HGB 12.2 (L) 01/28/2016   HCT 35.9 (L) 01/28/2016   MCV 86.7 01/28/2016   PLT 106 (L) 01/28/2016   NEUTROABS 2.0 01/28/2016     ASSESSMENT & PLAN:  MGUS (monoclonal gammopathy of unknown significance) #1 MGUS bone marrow biopsy and aspirate that revealed 3% plasma cells in the bone marrow  #2 thrombocytopenia: Likely ITP, platelets remained stable Previously given IV Iron  Blood work done 01/28/2016 was reviewed and CBC was 12.2 reticulocyte count: 2.87%. Platelets of 106 (were 97 in 2016). Serum protein electrophoresis does not show any M protein Elevated blood sugar 263  Obesity with high blood sugars: I discussed with him about the risks to his health from obesity and hyperglycemia. If he does not control these, he is at very high risk of coronary artery disease or stroke.  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 He has gained significant weight. I discussed with him that weight loss is necessary to preserve his health.  Patient wishes to follow with his primary care physician. I sent a message to Dr. Damita Dunnings to obtain once a year serum protein electrophoresis.   No orders of the defined types were placed in this encounter.  The patient has a good understanding of the overall plan. he agrees with it. he will call with any problems that may develop before the next visit here.   Rulon Eisenmenger, MD 02/04/16

## 2016-02-04 NOTE — Assessment & Plan Note (Addendum)
#  1 MGUS bone marrow biopsy and aspirate that revealed 3% plasma cells in the bone marrow  #2 thrombocytopenia: Likely ITP Previously given IV Iron  Blood work done 01/28/2016 was reviewed and CBC was 12.2 reticulocyte count: 2.87%. Platelets of 106 (were 97 in 2016). Serum protein electrophoresis does not show any M protein Elevated blood sugar 263  Obesity with high blood sugars: I discussed with them that his health is at grave danger from obesity and hyperglycemia. If he does not control these, he is at very high risk of coronary artery disease or stroke.  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 He has gained significant weight. I discussed with him that weight loss is necessary to preserve his health.  RTC in 1 year with labs

## 2016-02-22 ENCOUNTER — Other Ambulatory Visit: Payer: Self-pay | Admitting: *Deleted

## 2016-02-22 MED ORDER — ACCU-CHEK FASTCLIX LANCETS MISC: 3 refills | Status: DC

## 2016-02-22 MED ORDER — GLUCOSE BLOOD VI STRP: ORAL_STRIP | 3 refills | Status: DC

## 2016-02-22 MED ORDER — ACCU-CHEK NANO SMARTVIEW W/DEVICE KIT: PACK | 0 refills | Status: DC

## 2016-02-25 ENCOUNTER — Other Ambulatory Visit (INDEPENDENT_AMBULATORY_CARE_PROVIDER_SITE_OTHER): Payer: Commercial Managed Care - HMO

## 2016-02-25 DIAGNOSIS — E119 Type 2 diabetes mellitus without complications: Secondary | ICD-10-CM

## 2016-02-25 LAB — HEMOGLOBIN A1C: Hgb A1c MFr Bld: 7.8 % — ABNORMAL HIGH (ref 4.6–6.5)

## 2016-02-26 ENCOUNTER — Other Ambulatory Visit: Payer: Self-pay | Admitting: *Deleted

## 2016-03-10 DIAGNOSIS — H0289 Other specified disorders of eyelid: Secondary | ICD-10-CM | POA: Diagnosis not present

## 2016-03-10 DIAGNOSIS — Z888 Allergy status to other drugs, medicaments and biological substances status: Secondary | ICD-10-CM | POA: Diagnosis not present

## 2016-03-10 DIAGNOSIS — E1136 Type 2 diabetes mellitus with diabetic cataract: Secondary | ICD-10-CM | POA: Diagnosis not present

## 2016-03-10 DIAGNOSIS — H43813 Vitreous degeneration, bilateral: Secondary | ICD-10-CM | POA: Diagnosis not present

## 2016-03-10 DIAGNOSIS — Z7982 Long term (current) use of aspirin: Secondary | ICD-10-CM | POA: Diagnosis not present

## 2016-03-10 DIAGNOSIS — Z7984 Long term (current) use of oral hypoglycemic drugs: Secondary | ICD-10-CM | POA: Diagnosis not present

## 2016-03-10 DIAGNOSIS — I10 Essential (primary) hypertension: Secondary | ICD-10-CM | POA: Diagnosis not present

## 2016-03-10 DIAGNOSIS — E113513 Type 2 diabetes mellitus with proliferative diabetic retinopathy with macular edema, bilateral: Secondary | ICD-10-CM | POA: Diagnosis not present

## 2016-03-10 DIAGNOSIS — Z79899 Other long term (current) drug therapy: Secondary | ICD-10-CM | POA: Diagnosis not present

## 2016-04-07 DIAGNOSIS — Z7984 Long term (current) use of oral hypoglycemic drugs: Secondary | ICD-10-CM | POA: Diagnosis not present

## 2016-04-07 DIAGNOSIS — H2513 Age-related nuclear cataract, bilateral: Secondary | ICD-10-CM | POA: Diagnosis not present

## 2016-04-07 DIAGNOSIS — Z7982 Long term (current) use of aspirin: Secondary | ICD-10-CM | POA: Diagnosis not present

## 2016-04-07 DIAGNOSIS — Z79899 Other long term (current) drug therapy: Secondary | ICD-10-CM | POA: Diagnosis not present

## 2016-04-07 DIAGNOSIS — E113513 Type 2 diabetes mellitus with proliferative diabetic retinopathy with macular edema, bilateral: Secondary | ICD-10-CM | POA: Diagnosis not present

## 2016-04-07 DIAGNOSIS — H43812 Vitreous degeneration, left eye: Secondary | ICD-10-CM | POA: Diagnosis not present

## 2016-04-07 DIAGNOSIS — E1136 Type 2 diabetes mellitus with diabetic cataract: Secondary | ICD-10-CM | POA: Diagnosis not present

## 2016-04-07 DIAGNOSIS — I10 Essential (primary) hypertension: Secondary | ICD-10-CM | POA: Diagnosis not present

## 2016-04-28 DIAGNOSIS — X32XXXA Exposure to sunlight, initial encounter: Secondary | ICD-10-CM | POA: Diagnosis not present

## 2016-04-28 DIAGNOSIS — L570 Actinic keratosis: Secondary | ICD-10-CM | POA: Diagnosis not present

## 2016-04-29 ENCOUNTER — Encounter: Payer: Self-pay | Admitting: Family Medicine

## 2016-04-29 ENCOUNTER — Ambulatory Visit (INDEPENDENT_AMBULATORY_CARE_PROVIDER_SITE_OTHER): Payer: Medicare HMO | Admitting: Family Medicine

## 2016-04-29 DIAGNOSIS — J209 Acute bronchitis, unspecified: Secondary | ICD-10-CM | POA: Diagnosis not present

## 2016-04-29 MED ORDER — BENZONATATE 200 MG PO CAPS: 200.0000 mg | ORAL_CAPSULE | Freq: Three times a day (TID) | ORAL | 0 refills | Status: DC | PRN

## 2016-04-29 MED ORDER — AZITHROMYCIN 250 MG PO TABS: ORAL_TABLET | ORAL | 0 refills | Status: DC

## 2016-04-29 NOTE — Patient Instructions (Addendum)
Use tessalon for the cough, hold zithromax for now. If more sputum or worse in the next few days, then start zithromax.   Rest and fluids in the meantime.   Recheck A1c in a about 2 months before a visit.   Take care.  Glad to see you.

## 2016-04-29 NOTE — Progress Notes (Signed)
Pre visit review using our clinic review tool, if applicable. No additional management support is needed unless otherwise documented below in the visit note. 

## 2016-04-29 NOTE — Progress Notes (Signed)
Dennis Wise was controlled until recently ill.  He has been working on his weight.  D/w pt.  Intentional weight loss.    Sx started the week before xmas, with chest and coughing and aches.  Aches resolved, as did ST.  Still with cough and chest congestion, worse at night.  No fevers in the last 2 weeks.  Some wheeze.    "Swallowing changes"- "it feels like I have something in my throat."  Only noted since he has been sick.  No vomiting, no blood in stool.  No actual dysphagia, no food or liquid dysphagia.    Meds, vitals, and allergies reviewed.   ROS: Per HPI unless specifically indicated in ROS section   GEN: nad, alert and oriented HEENT: mucous membranes moist, tm w/o erythema, nasal exam w/o erythema, clear discharge noted,  OP with cobblestoning, no stridor NECK: supple w/o LA CV: rrr.   PULM: ctab, no inc wob EXT: no edema

## 2016-04-30 DIAGNOSIS — J209 Acute bronchitis, unspecified: Secondary | ICD-10-CM | POA: Insufficient documentation

## 2016-04-30 NOTE — Assessment & Plan Note (Signed)
Nontoxic.  Use tessalon for the cough, hold zithromax for now. If more sputum or worse in the next few days, then start zithromax.   Rest and fluids in the meantime.   No actual dysphagia, no food or liquid dysphagia.   Sx should resolve with improvement of URI/bronchitis.  He agrees.

## 2016-05-02 DIAGNOSIS — H52203 Unspecified astigmatism, bilateral: Secondary | ICD-10-CM | POA: Diagnosis not present

## 2016-05-02 DIAGNOSIS — H269 Unspecified cataract: Secondary | ICD-10-CM | POA: Diagnosis not present

## 2016-05-02 DIAGNOSIS — I10 Essential (primary) hypertension: Secondary | ICD-10-CM | POA: Diagnosis not present

## 2016-05-02 DIAGNOSIS — Z7982 Long term (current) use of aspirin: Secondary | ICD-10-CM | POA: Diagnosis not present

## 2016-05-02 DIAGNOSIS — Z7984 Long term (current) use of oral hypoglycemic drugs: Secondary | ICD-10-CM | POA: Diagnosis not present

## 2016-05-02 DIAGNOSIS — H524 Presbyopia: Secondary | ICD-10-CM | POA: Diagnosis not present

## 2016-05-02 DIAGNOSIS — H5213 Myopia, bilateral: Secondary | ICD-10-CM | POA: Diagnosis not present

## 2016-05-02 DIAGNOSIS — H25813 Combined forms of age-related cataract, bilateral: Secondary | ICD-10-CM | POA: Insufficient documentation

## 2016-05-02 DIAGNOSIS — E113513 Type 2 diabetes mellitus with proliferative diabetic retinopathy with macular edema, bilateral: Secondary | ICD-10-CM | POA: Diagnosis not present

## 2016-05-02 DIAGNOSIS — H02831 Dermatochalasis of right upper eyelid: Secondary | ICD-10-CM | POA: Diagnosis not present

## 2016-05-02 DIAGNOSIS — E11311 Type 2 diabetes mellitus with unspecified diabetic retinopathy with macular edema: Secondary | ICD-10-CM | POA: Diagnosis not present

## 2016-05-02 DIAGNOSIS — H02834 Dermatochalasis of left upper eyelid: Secondary | ICD-10-CM | POA: Diagnosis not present

## 2016-05-02 HISTORY — DX: Combined forms of age-related cataract, bilateral: H25.813

## 2016-05-12 ENCOUNTER — Ambulatory Visit (INDEPENDENT_AMBULATORY_CARE_PROVIDER_SITE_OTHER): Payer: Medicare HMO

## 2016-05-12 DIAGNOSIS — H259 Unspecified age-related cataract: Secondary | ICD-10-CM | POA: Diagnosis not present

## 2016-05-12 DIAGNOSIS — E11311 Type 2 diabetes mellitus with unspecified diabetic retinopathy with macular edema: Secondary | ICD-10-CM | POA: Diagnosis not present

## 2016-05-12 DIAGNOSIS — H25813 Combined forms of age-related cataract, bilateral: Secondary | ICD-10-CM | POA: Diagnosis not present

## 2016-05-12 DIAGNOSIS — E113513 Type 2 diabetes mellitus with proliferative diabetic retinopathy with macular edema, bilateral: Secondary | ICD-10-CM | POA: Diagnosis not present

## 2016-05-12 DIAGNOSIS — E1151 Type 2 diabetes mellitus with diabetic peripheral angiopathy without gangrene: Secondary | ICD-10-CM | POA: Diagnosis not present

## 2016-05-12 DIAGNOSIS — H02836 Dermatochalasis of left eye, unspecified eyelid: Secondary | ICD-10-CM | POA: Diagnosis not present

## 2016-05-12 DIAGNOSIS — Z7984 Long term (current) use of oral hypoglycemic drugs: Secondary | ICD-10-CM | POA: Diagnosis not present

## 2016-05-12 DIAGNOSIS — I10 Essential (primary) hypertension: Secondary | ICD-10-CM | POA: Diagnosis not present

## 2016-05-12 DIAGNOSIS — Z7982 Long term (current) use of aspirin: Secondary | ICD-10-CM | POA: Diagnosis not present

## 2016-05-12 DIAGNOSIS — Z23 Encounter for immunization: Secondary | ICD-10-CM | POA: Diagnosis not present

## 2016-05-12 DIAGNOSIS — H02833 Dermatochalasis of right eye, unspecified eyelid: Secondary | ICD-10-CM | POA: Diagnosis not present

## 2016-06-03 DIAGNOSIS — X32XXXA Exposure to sunlight, initial encounter: Secondary | ICD-10-CM | POA: Diagnosis not present

## 2016-06-03 DIAGNOSIS — L570 Actinic keratosis: Secondary | ICD-10-CM | POA: Diagnosis not present

## 2016-06-11 ENCOUNTER — Other Ambulatory Visit: Payer: Self-pay | Admitting: Family Medicine

## 2016-06-17 DIAGNOSIS — D225 Melanocytic nevi of trunk: Secondary | ICD-10-CM | POA: Diagnosis not present

## 2016-06-17 DIAGNOSIS — L570 Actinic keratosis: Secondary | ICD-10-CM | POA: Diagnosis not present

## 2016-06-17 DIAGNOSIS — D2262 Melanocytic nevi of left upper limb, including shoulder: Secondary | ICD-10-CM | POA: Diagnosis not present

## 2016-06-17 DIAGNOSIS — Z85828 Personal history of other malignant neoplasm of skin: Secondary | ICD-10-CM | POA: Diagnosis not present

## 2016-06-17 DIAGNOSIS — X32XXXA Exposure to sunlight, initial encounter: Secondary | ICD-10-CM | POA: Diagnosis not present

## 2016-06-17 DIAGNOSIS — D2271 Melanocytic nevi of right lower limb, including hip: Secondary | ICD-10-CM | POA: Diagnosis not present

## 2016-07-04 DIAGNOSIS — H02833 Dermatochalasis of right eye, unspecified eyelid: Secondary | ICD-10-CM | POA: Diagnosis not present

## 2016-07-04 DIAGNOSIS — Z7984 Long term (current) use of oral hypoglycemic drugs: Secondary | ICD-10-CM | POA: Diagnosis not present

## 2016-07-04 DIAGNOSIS — Z79899 Other long term (current) drug therapy: Secondary | ICD-10-CM | POA: Diagnosis not present

## 2016-07-04 DIAGNOSIS — E113513 Type 2 diabetes mellitus with proliferative diabetic retinopathy with macular edema, bilateral: Secondary | ICD-10-CM | POA: Diagnosis not present

## 2016-07-04 DIAGNOSIS — I10 Essential (primary) hypertension: Secondary | ICD-10-CM | POA: Diagnosis not present

## 2016-07-04 DIAGNOSIS — E113511 Type 2 diabetes mellitus with proliferative diabetic retinopathy with macular edema, right eye: Secondary | ICD-10-CM | POA: Diagnosis not present

## 2016-07-04 DIAGNOSIS — H02836 Dermatochalasis of left eye, unspecified eyelid: Secondary | ICD-10-CM | POA: Diagnosis not present

## 2016-07-04 DIAGNOSIS — H43812 Vitreous degeneration, left eye: Secondary | ICD-10-CM | POA: Diagnosis not present

## 2016-07-04 DIAGNOSIS — E1136 Type 2 diabetes mellitus with diabetic cataract: Secondary | ICD-10-CM | POA: Diagnosis not present

## 2016-07-04 DIAGNOSIS — Z7982 Long term (current) use of aspirin: Secondary | ICD-10-CM | POA: Diagnosis not present

## 2016-07-10 DIAGNOSIS — H25813 Combined forms of age-related cataract, bilateral: Secondary | ICD-10-CM | POA: Diagnosis not present

## 2016-07-10 DIAGNOSIS — I10 Essential (primary) hypertension: Secondary | ICD-10-CM | POA: Diagnosis not present

## 2016-07-10 DIAGNOSIS — Z79899 Other long term (current) drug therapy: Secondary | ICD-10-CM | POA: Diagnosis not present

## 2016-07-10 DIAGNOSIS — E7800 Pure hypercholesterolemia, unspecified: Secondary | ICD-10-CM | POA: Diagnosis not present

## 2016-07-10 DIAGNOSIS — E113513 Type 2 diabetes mellitus with proliferative diabetic retinopathy with macular edema, bilateral: Secondary | ICD-10-CM | POA: Diagnosis not present

## 2016-07-10 DIAGNOSIS — Z7984 Long term (current) use of oral hypoglycemic drugs: Secondary | ICD-10-CM | POA: Diagnosis not present

## 2016-07-10 DIAGNOSIS — Z888 Allergy status to other drugs, medicaments and biological substances status: Secondary | ICD-10-CM | POA: Diagnosis not present

## 2016-07-10 DIAGNOSIS — Z7982 Long term (current) use of aspirin: Secondary | ICD-10-CM | POA: Diagnosis not present

## 2016-07-10 DIAGNOSIS — E1136 Type 2 diabetes mellitus with diabetic cataract: Secondary | ICD-10-CM | POA: Diagnosis not present

## 2016-07-16 DIAGNOSIS — H269 Unspecified cataract: Secondary | ICD-10-CM | POA: Diagnosis not present

## 2016-07-16 DIAGNOSIS — H2522 Age-related cataract, morgagnian type, left eye: Secondary | ICD-10-CM | POA: Diagnosis not present

## 2016-07-16 DIAGNOSIS — E113513 Type 2 diabetes mellitus with proliferative diabetic retinopathy with macular edema, bilateral: Secondary | ICD-10-CM | POA: Diagnosis not present

## 2016-07-16 DIAGNOSIS — Z7984 Long term (current) use of oral hypoglycemic drugs: Secondary | ICD-10-CM | POA: Diagnosis not present

## 2016-07-16 DIAGNOSIS — H25813 Combined forms of age-related cataract, bilateral: Secondary | ICD-10-CM | POA: Diagnosis not present

## 2016-07-17 DIAGNOSIS — E113513 Type 2 diabetes mellitus with proliferative diabetic retinopathy with macular edema, bilateral: Secondary | ICD-10-CM | POA: Diagnosis not present

## 2016-07-17 DIAGNOSIS — Z48810 Encounter for surgical aftercare following surgery on the sense organs: Secondary | ICD-10-CM | POA: Diagnosis not present

## 2016-07-17 DIAGNOSIS — H524 Presbyopia: Secondary | ICD-10-CM | POA: Diagnosis not present

## 2016-07-17 DIAGNOSIS — Z961 Presence of intraocular lens: Secondary | ICD-10-CM | POA: Insufficient documentation

## 2016-07-17 DIAGNOSIS — H43812 Vitreous degeneration, left eye: Secondary | ICD-10-CM | POA: Diagnosis not present

## 2016-07-17 DIAGNOSIS — H4389 Other disorders of vitreous body: Secondary | ICD-10-CM | POA: Diagnosis not present

## 2016-07-17 DIAGNOSIS — H52203 Unspecified astigmatism, bilateral: Secondary | ICD-10-CM | POA: Diagnosis not present

## 2016-07-17 DIAGNOSIS — H02834 Dermatochalasis of left upper eyelid: Secondary | ICD-10-CM | POA: Diagnosis not present

## 2016-07-17 DIAGNOSIS — H5213 Myopia, bilateral: Secondary | ICD-10-CM | POA: Diagnosis not present

## 2016-07-17 DIAGNOSIS — E1136 Type 2 diabetes mellitus with diabetic cataract: Secondary | ICD-10-CM | POA: Diagnosis not present

## 2016-07-17 HISTORY — DX: Presence of intraocular lens: Z96.1

## 2016-08-01 DIAGNOSIS — Z48810 Encounter for surgical aftercare following surgery on the sense organs: Secondary | ICD-10-CM | POA: Diagnosis not present

## 2016-08-01 DIAGNOSIS — H43812 Vitreous degeneration, left eye: Secondary | ICD-10-CM | POA: Diagnosis not present

## 2016-08-01 DIAGNOSIS — H524 Presbyopia: Secondary | ICD-10-CM | POA: Diagnosis not present

## 2016-08-01 DIAGNOSIS — H52203 Unspecified astigmatism, bilateral: Secondary | ICD-10-CM | POA: Diagnosis not present

## 2016-08-01 DIAGNOSIS — H5213 Myopia, bilateral: Secondary | ICD-10-CM | POA: Diagnosis not present

## 2016-08-01 DIAGNOSIS — H02834 Dermatochalasis of left upper eyelid: Secondary | ICD-10-CM | POA: Diagnosis not present

## 2016-08-01 DIAGNOSIS — E113513 Type 2 diabetes mellitus with proliferative diabetic retinopathy with macular edema, bilateral: Secondary | ICD-10-CM | POA: Diagnosis not present

## 2016-08-01 DIAGNOSIS — H43392 Other vitreous opacities, left eye: Secondary | ICD-10-CM | POA: Diagnosis not present

## 2016-08-01 DIAGNOSIS — H02831 Dermatochalasis of right upper eyelid: Secondary | ICD-10-CM | POA: Diagnosis not present

## 2016-08-08 DIAGNOSIS — E113511 Type 2 diabetes mellitus with proliferative diabetic retinopathy with macular edema, right eye: Secondary | ICD-10-CM | POA: Diagnosis not present

## 2016-08-08 DIAGNOSIS — H02836 Dermatochalasis of left eye, unspecified eyelid: Secondary | ICD-10-CM | POA: Diagnosis not present

## 2016-08-08 DIAGNOSIS — H02833 Dermatochalasis of right eye, unspecified eyelid: Secondary | ICD-10-CM | POA: Diagnosis not present

## 2016-08-08 DIAGNOSIS — Z961 Presence of intraocular lens: Secondary | ICD-10-CM | POA: Diagnosis not present

## 2016-08-08 DIAGNOSIS — H2512 Age-related nuclear cataract, left eye: Secondary | ICD-10-CM | POA: Diagnosis not present

## 2016-08-08 DIAGNOSIS — Z7984 Long term (current) use of oral hypoglycemic drugs: Secondary | ICD-10-CM | POA: Diagnosis not present

## 2016-08-08 DIAGNOSIS — R69 Illness, unspecified: Secondary | ICD-10-CM | POA: Diagnosis not present

## 2016-08-08 DIAGNOSIS — E113513 Type 2 diabetes mellitus with proliferative diabetic retinopathy with macular edema, bilateral: Secondary | ICD-10-CM | POA: Diagnosis not present

## 2016-08-29 DIAGNOSIS — H02831 Dermatochalasis of right upper eyelid: Secondary | ICD-10-CM | POA: Diagnosis not present

## 2016-08-29 DIAGNOSIS — Z48810 Encounter for surgical aftercare following surgery on the sense organs: Secondary | ICD-10-CM | POA: Diagnosis not present

## 2016-08-29 DIAGNOSIS — H59021 Cataract (lens) fragments in eye following cataract surgery, right eye: Secondary | ICD-10-CM | POA: Diagnosis not present

## 2016-08-29 DIAGNOSIS — H524 Presbyopia: Secondary | ICD-10-CM | POA: Diagnosis not present

## 2016-08-29 DIAGNOSIS — E113513 Type 2 diabetes mellitus with proliferative diabetic retinopathy with macular edema, bilateral: Secondary | ICD-10-CM | POA: Diagnosis not present

## 2016-08-29 DIAGNOSIS — H02834 Dermatochalasis of left upper eyelid: Secondary | ICD-10-CM | POA: Diagnosis not present

## 2016-08-29 DIAGNOSIS — H5213 Myopia, bilateral: Secondary | ICD-10-CM | POA: Diagnosis not present

## 2016-08-29 DIAGNOSIS — H52203 Unspecified astigmatism, bilateral: Secondary | ICD-10-CM | POA: Diagnosis not present

## 2016-09-12 DIAGNOSIS — H02833 Dermatochalasis of right eye, unspecified eyelid: Secondary | ICD-10-CM | POA: Diagnosis not present

## 2016-09-12 DIAGNOSIS — E113513 Type 2 diabetes mellitus with proliferative diabetic retinopathy with macular edema, bilateral: Secondary | ICD-10-CM | POA: Diagnosis not present

## 2016-09-12 DIAGNOSIS — E113511 Type 2 diabetes mellitus with proliferative diabetic retinopathy with macular edema, right eye: Secondary | ICD-10-CM | POA: Diagnosis not present

## 2016-09-12 DIAGNOSIS — Z961 Presence of intraocular lens: Secondary | ICD-10-CM | POA: Diagnosis not present

## 2016-09-12 DIAGNOSIS — H02836 Dermatochalasis of left eye, unspecified eyelid: Secondary | ICD-10-CM | POA: Diagnosis not present

## 2016-09-12 DIAGNOSIS — Z9889 Other specified postprocedural states: Secondary | ICD-10-CM | POA: Diagnosis not present

## 2016-09-12 DIAGNOSIS — H2512 Age-related nuclear cataract, left eye: Secondary | ICD-10-CM | POA: Diagnosis not present

## 2016-09-16 ENCOUNTER — Other Ambulatory Visit: Payer: Self-pay | Admitting: Family Medicine

## 2016-10-13 DIAGNOSIS — H2512 Age-related nuclear cataract, left eye: Secondary | ICD-10-CM | POA: Diagnosis not present

## 2016-10-13 DIAGNOSIS — E113511 Type 2 diabetes mellitus with proliferative diabetic retinopathy with macular edema, right eye: Secondary | ICD-10-CM | POA: Diagnosis not present

## 2016-10-13 DIAGNOSIS — Z961 Presence of intraocular lens: Secondary | ICD-10-CM | POA: Diagnosis not present

## 2016-10-13 DIAGNOSIS — H02836 Dermatochalasis of left eye, unspecified eyelid: Secondary | ICD-10-CM | POA: Diagnosis not present

## 2016-10-13 DIAGNOSIS — E113513 Type 2 diabetes mellitus with proliferative diabetic retinopathy with macular edema, bilateral: Secondary | ICD-10-CM | POA: Diagnosis not present

## 2016-10-13 DIAGNOSIS — H02833 Dermatochalasis of right eye, unspecified eyelid: Secondary | ICD-10-CM | POA: Diagnosis not present

## 2016-11-14 DIAGNOSIS — Z961 Presence of intraocular lens: Secondary | ICD-10-CM | POA: Diagnosis not present

## 2016-11-14 DIAGNOSIS — H2512 Age-related nuclear cataract, left eye: Secondary | ICD-10-CM | POA: Diagnosis not present

## 2016-11-14 DIAGNOSIS — H02833 Dermatochalasis of right eye, unspecified eyelid: Secondary | ICD-10-CM | POA: Diagnosis not present

## 2016-11-14 DIAGNOSIS — E113513 Type 2 diabetes mellitus with proliferative diabetic retinopathy with macular edema, bilateral: Secondary | ICD-10-CM | POA: Diagnosis not present

## 2016-11-14 DIAGNOSIS — H35371 Puckering of macula, right eye: Secondary | ICD-10-CM | POA: Diagnosis not present

## 2016-11-14 DIAGNOSIS — E113511 Type 2 diabetes mellitus with proliferative diabetic retinopathy with macular edema, right eye: Secondary | ICD-10-CM | POA: Diagnosis not present

## 2016-11-14 DIAGNOSIS — H02836 Dermatochalasis of left eye, unspecified eyelid: Secondary | ICD-10-CM | POA: Diagnosis not present

## 2016-11-27 IMAGING — MR MR BRAIN/IAC WO/W
10 of 11 series · 40 of 48 positions shown · IV contrast (multihance)
Comparison: None.

CLINICAL DATA: Asymmetric hearing loss on the right.

EXAM:
MR BRAIN/IAC WITHOUT AND WITH CONTRAST
TECHNIQUE: Multiplanar, multisequence MR imaging was performed both before and
after administration of intravenous contrast.
CONTRAST:  20mL MULTIHANCE GADOBENATE DIMEGLUMINE 529 MG/ML IV SOLN

[Series 2: T1 · sagittal · 5.0mm · 0.45mm/px · 5 of 27 slices shown (1 of 3)]
[im 1/27]
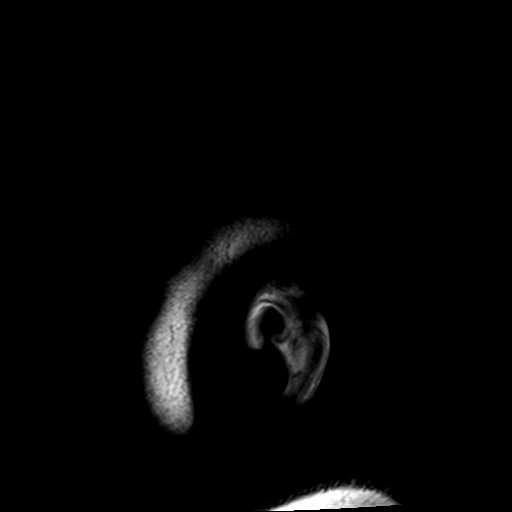
[im 7/27]
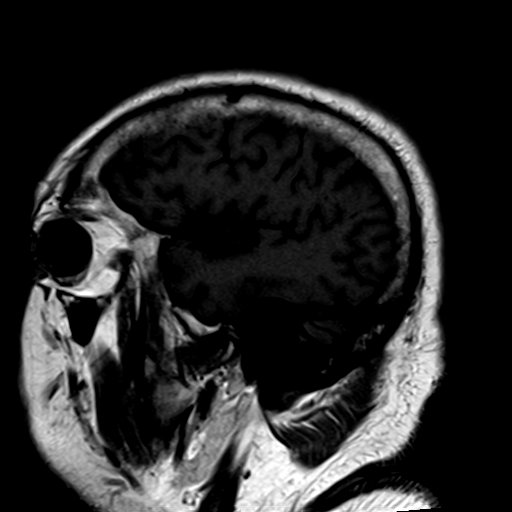
[im 14/27]
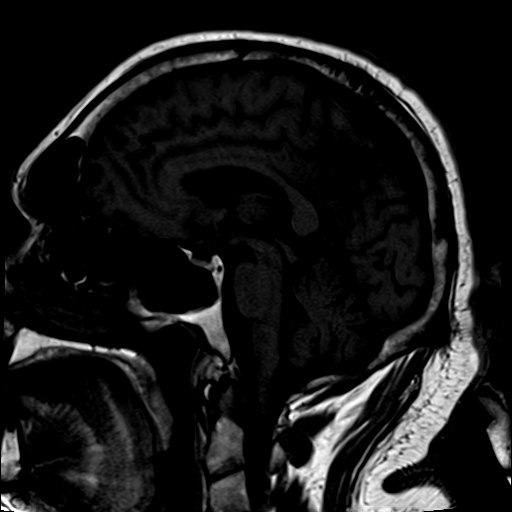
[im 20/27]
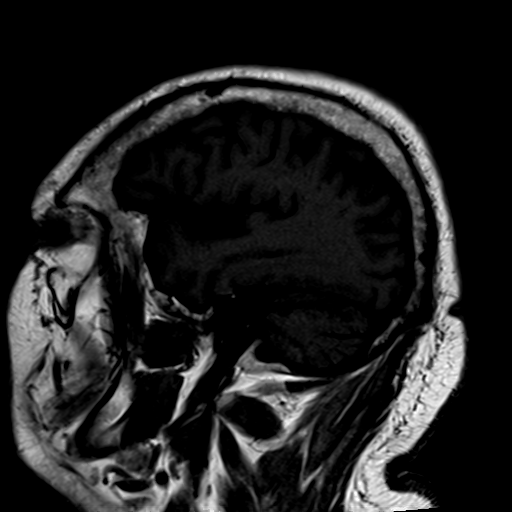
[im 27/27]
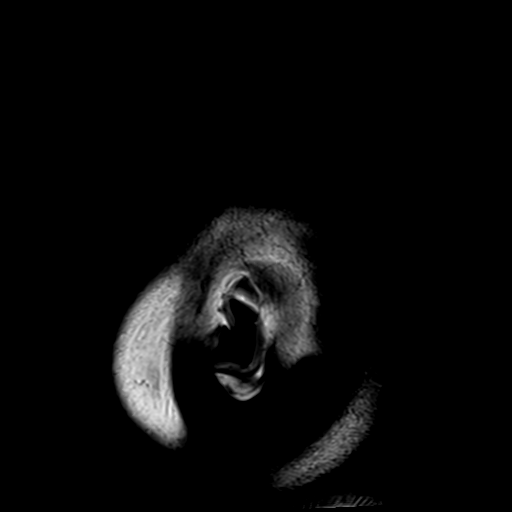

[Series 4: DWI · axial · 3.0mm · 1.20mm/px · z∈[-32,+131]mm · 8 of 58 slices shown (1 of 2)]
[im 1/58]
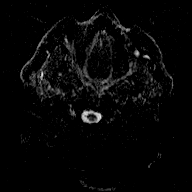
[im 9/58]
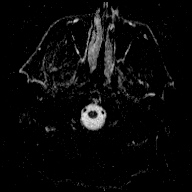
[im 17/58]
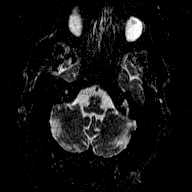
[im 25/58]
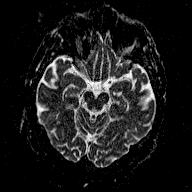
[im 33/58]
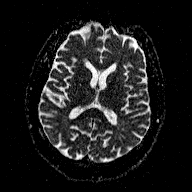
[im 41/58]
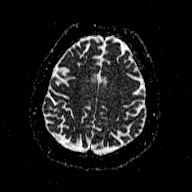
[im 49/58]
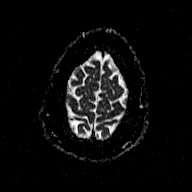
[im 58/58]
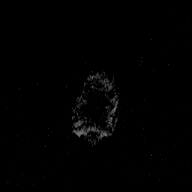

[Series 5: T2 · axial · 5.0mm · 0.72mm/px · z∈[-35,+132]mm · 4 of 28 slices shown]
[im 1/28]
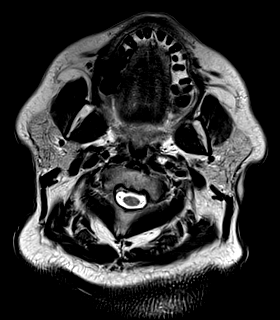
[im 10/28]
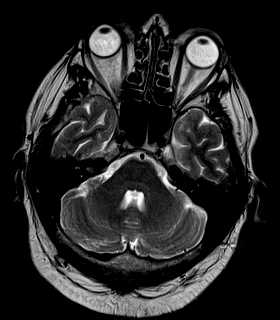
[im 19/28]
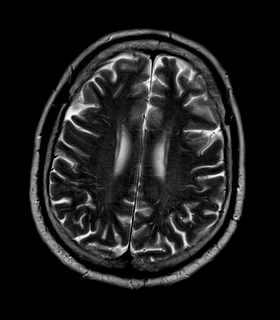
[im 28/28]
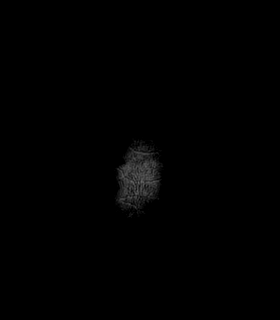

[Series 6: FLAIR · axial · 5.0mm · 0.45mm/px · z∈[-35,+132]mm · 4 of 28 slices shown]
[im 1/28]
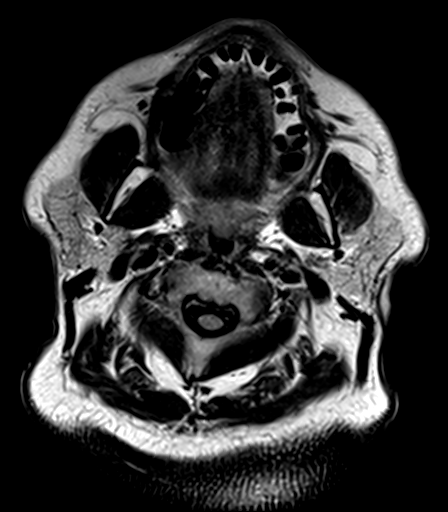
[im 10/28]
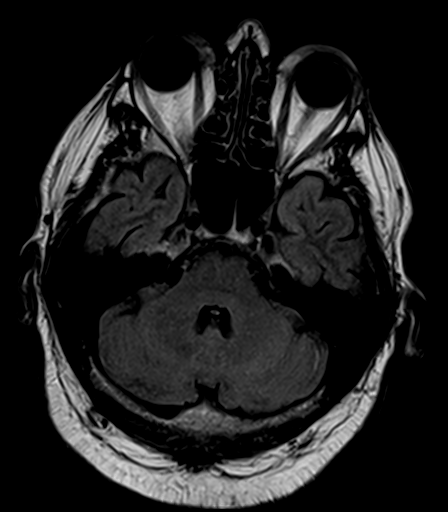
[im 19/28]
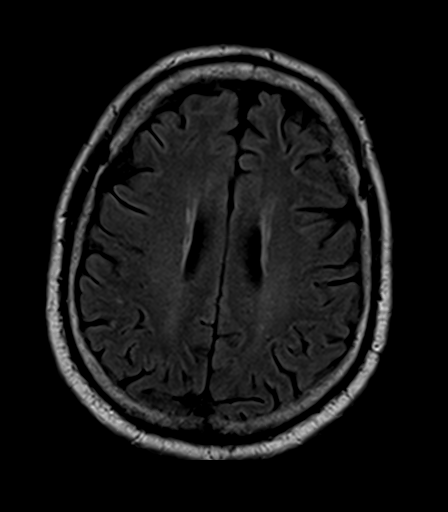
[im 28/28]
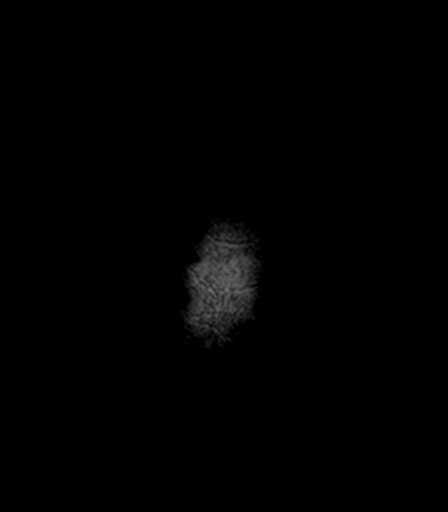

[Series 7: T1 · coronal · 3.0mm · 0.37mm/px · 1 of 11 slices shown (2 of 3)]
[im 1/11]
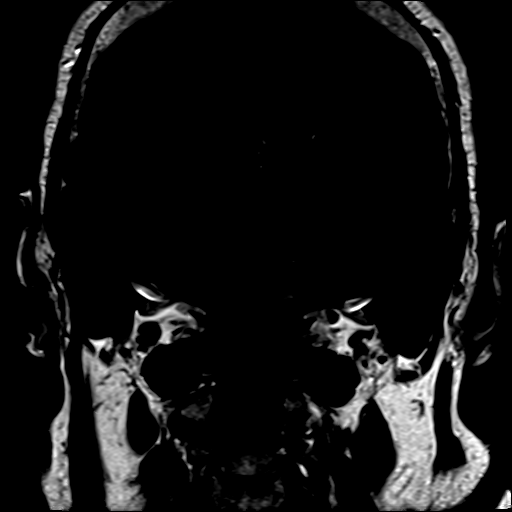

[Series 9: T1 · axial · 3.0mm · 0.37mm/px · 1 of 11 slices shown (3 of 3)]
[im 1/11]
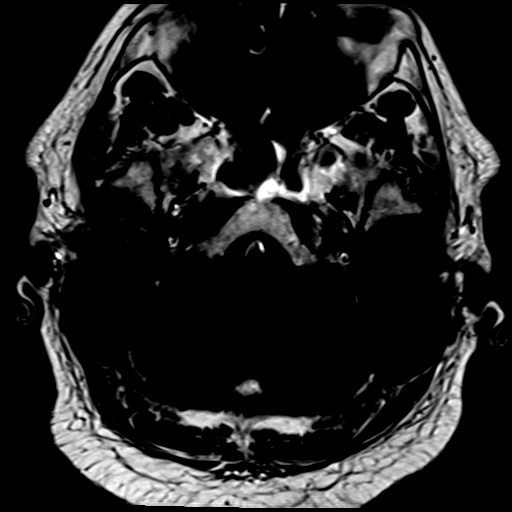

[Series 10: T1 post-contrast · axial · 3.0mm · 0.37mm/px · 1 of 11 slices shown (1 of 3)]
[im 1/11]
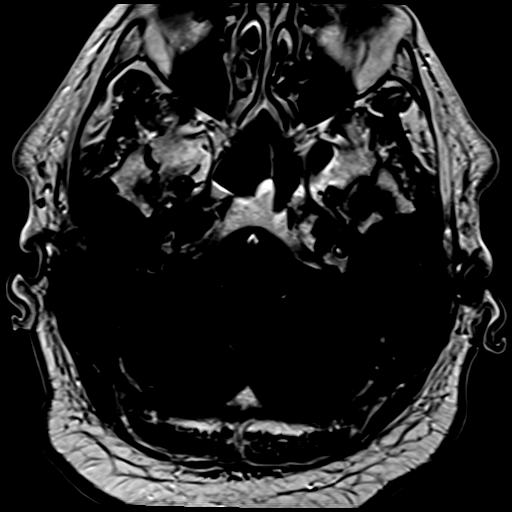

[Series 11: T1 post-contrast · coronal · 3.0mm · 0.37mm/px · 1 of 11 slices shown (2 of 3)]
[im 1/11]
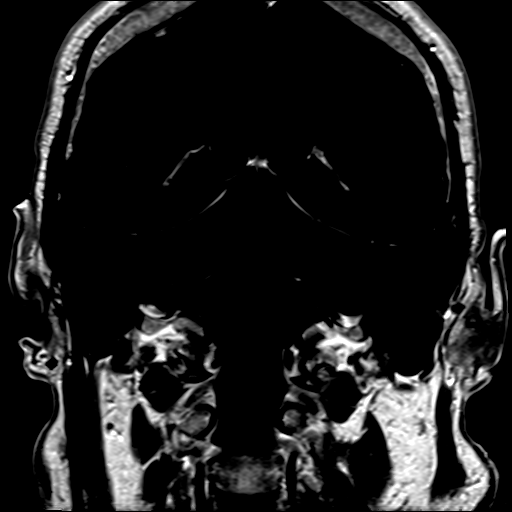

[Series 12: T1 post-contrast · axial · 3.0mm · 1.00mm/px · z∈[-16,+140]mm · 7 of 56 slices shown (3 of 3)]
[im 1/56]
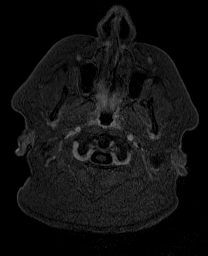
[im 10/56]
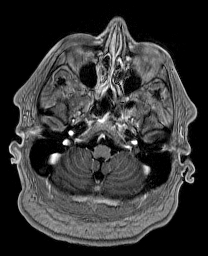
[im 19/56]
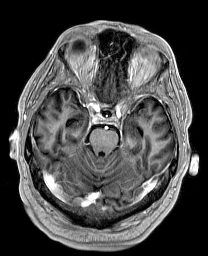
[im 28/56]
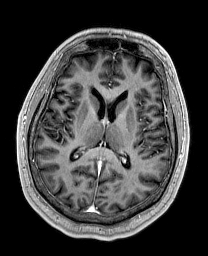
[im 37/56]
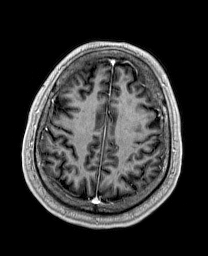
[im 46/56]
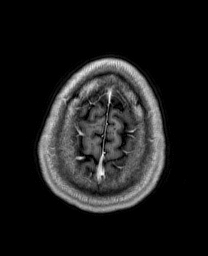
[im 56/56]
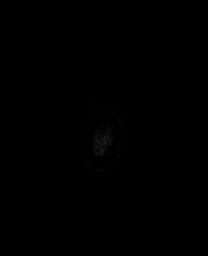

[Series 100: DWI · axial · 3.0mm · 1.20mm/px · z∈[-32,+131]mm · 8 of 58 slices shown (2 of 2)]
[im 1/58]
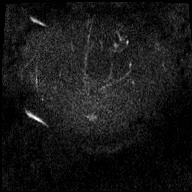
[im 9/58]
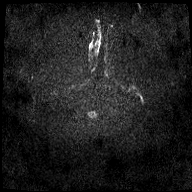
[im 17/58]
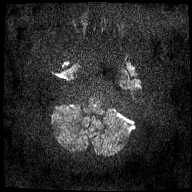
[im 25/58]
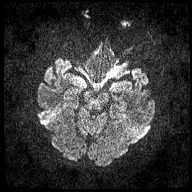
[im 33/58]
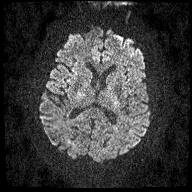
[im 41/58]
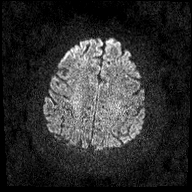
[im 49/58]
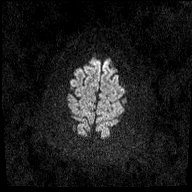
[im 58/58]
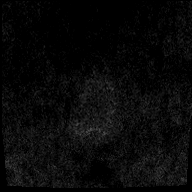

[40 of 48 positions shown; findings below may reference images not displayed]

FINDINGS: Calvarium and upper cervical spine: No focal marrow signal
abnormality.

Orbits: No significant findings.

Sinuses and Mastoids: Clear. Mastoid and middle ears are clear.

Brain: Symmetric and normal labyrinthine signal and nonenhancement.
No vestibular cochlear nerve mass or enhancement. Normal brainstem.

No acute or remote infarct, hemorrhage, hydrocephalus, or mass
lesion. No evidence of large vessel occlusion.

Tiny T2 and FLAIR hyperintense signal abnormalities in bilateral
cerebral white matter are slightly too numerous for age, consistent
with mild small vessel disease in this patient with history of
hypertension and diabetes.
IMPRESSION: Negative.  No explanation for hearing loss.

## 2016-12-18 ENCOUNTER — Telehealth: Payer: Self-pay | Admitting: Family Medicine

## 2016-12-18 NOTE — Telephone Encounter (Signed)
Left pt message asking to call Ebony Hail back directly at [phone] to schedule AWV + labs with Katha Cabal and CPE with PCP.  *NOTE* Last AWV 10/22/15

## 2016-12-19 NOTE — Telephone Encounter (Signed)
Scheduled 01/30/17

## 2017-01-25 ENCOUNTER — Other Ambulatory Visit: Payer: Self-pay | Admitting: Family Medicine

## 2017-01-25 DIAGNOSIS — E113299 Type 2 diabetes mellitus with mild nonproliferative diabetic retinopathy without macular edema, unspecified eye: Secondary | ICD-10-CM

## 2017-01-27 ENCOUNTER — Other Ambulatory Visit: Payer: Commercial Managed Care - HMO

## 2017-01-29 ENCOUNTER — Other Ambulatory Visit: Payer: Self-pay | Admitting: Family Medicine

## 2017-01-29 DIAGNOSIS — D472 Monoclonal gammopathy: Secondary | ICD-10-CM

## 2017-01-30 ENCOUNTER — Ambulatory Visit (INDEPENDENT_AMBULATORY_CARE_PROVIDER_SITE_OTHER): Payer: Medicare HMO

## 2017-01-30 VITALS — BP 142/60 | HR 55 | Temp 98.1°F | Ht 72.5 in | Wt 285.5 lb

## 2017-01-30 DIAGNOSIS — D472 Monoclonal gammopathy: Secondary | ICD-10-CM

## 2017-01-30 DIAGNOSIS — E113299 Type 2 diabetes mellitus with mild nonproliferative diabetic retinopathy without macular edema, unspecified eye: Secondary | ICD-10-CM | POA: Diagnosis not present

## 2017-01-30 DIAGNOSIS — Z23 Encounter for immunization: Secondary | ICD-10-CM | POA: Diagnosis not present

## 2017-01-30 DIAGNOSIS — Z0000 Encounter for general adult medical examination without abnormal findings: Secondary | ICD-10-CM | POA: Diagnosis not present

## 2017-01-30 LAB — CBC WITH DIFFERENTIAL/PLATELET
BASOS ABS: 0 10*3/uL (ref 0.0–0.1)
Basophils Relative: 0.5 % (ref 0.0–3.0)
EOS ABS: 0.1 10*3/uL (ref 0.0–0.7)
Eosinophils Relative: 2.2 % (ref 0.0–5.0)
HCT: 37.9 % — ABNORMAL LOW (ref 39.0–52.0)
HEMOGLOBIN: 12.8 g/dL — AB (ref 13.0–17.0)
Lymphocytes Relative: 32 % (ref 12.0–46.0)
Lymphs Abs: 1.5 10*3/uL (ref 0.7–4.0)
MCHC: 33.8 g/dL (ref 30.0–36.0)
MCV: 88.6 fl (ref 78.0–100.0)
MONO ABS: 0.4 10*3/uL (ref 0.1–1.0)
Monocytes Relative: 8.4 % (ref 3.0–12.0)
Neutro Abs: 2.7 10*3/uL (ref 1.4–7.7)
Neutrophils Relative %: 56.9 % (ref 43.0–77.0)
Platelets: 112 10*3/uL — ABNORMAL LOW (ref 150.0–400.0)
RBC: 4.28 Mil/uL (ref 4.22–5.81)
RDW: 13.2 % (ref 11.5–15.5)
WBC: 4.7 10*3/uL (ref 4.0–10.5)

## 2017-01-30 LAB — COMPREHENSIVE METABOLIC PANEL
ALBUMIN: 4.2 g/dL (ref 3.5–5.2)
ALT: 11 U/L (ref 0–53)
AST: 14 U/L (ref 0–37)
Alkaline Phosphatase: 81 U/L (ref 39–117)
BILIRUBIN TOTAL: 0.8 mg/dL (ref 0.2–1.2)
BUN: 21 mg/dL (ref 6–23)
CALCIUM: 8.6 mg/dL (ref 8.4–10.5)
CHLORIDE: 103 meq/L (ref 96–112)
CO2: 29 mEq/L (ref 19–32)
CREATININE: 1.17 mg/dL (ref 0.40–1.50)
GFR: 65.78 mL/min (ref >60.00)
Glucose, Bld: 151 mg/dL — ABNORMAL HIGH (ref 70–99)
Potassium: 4.3 mEq/L (ref 3.5–5.1)
Sodium: 139 mEq/L (ref 135–145)
Total Protein: 6.7 g/dL (ref 6.0–8.3)

## 2017-01-30 LAB — LIPID PANEL
CHOLESTEROL: 143 mg/dL (ref 0–200)
HDL: 68.5 mg/dL (ref >39.00)
LDL CALC: 57 mg/dL (ref 0–99)
NonHDL: 74.55
TRIGLYCERIDES: 88 mg/dL (ref 0.0–149.0)
Total CHOL/HDL Ratio: 2
VLDL: 17.6 mg/dL (ref 0.0–40.0)

## 2017-01-30 LAB — HEMOGLOBIN A1C: HEMOGLOBIN A1C: 8 % — AB (ref 4.6–6.5)

## 2017-01-30 NOTE — Progress Notes (Signed)
Pre visit review using our clinic review tool, if applicable. No additional management support is needed unless otherwise documented below in the visit note. 

## 2017-01-30 NOTE — Patient Instructions (Signed)
Dennis Wise , Thank you for taking time to come for your Medicare Wellness Visit. I appreciate your ongoing commitment to your health goals. Please review the following plan we discussed and let me know if I can assist you in the future.   These are the goals we discussed: Goals    . 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        This is a list of the screening recommended for you and due dates:  Health Maintenance  Topic Date Due  . Complete foot exam   10/20/2017*  . Hemoglobin A1C  07/31/2017  . Eye exam for diabetics  01/09/2018  . Colon Cancer Screening  04/07/2018  . Tetanus Vaccine  10/17/2019  . Flu Shot  Completed  .  Hepatitis C: One time screening is recommended by Center for Disease Control  (CDC) for  adults born from [birth year] through 1967.   Completed  . Pneumonia vaccines  Completed  *Topic was postponed. The date shown is not the original due date.   Preventive Care for Adults  A healthy lifestyle and preventive care can promote health and wellness. Preventive health guidelines for adults include the following key practices.  . A routine yearly physical is a good way to check with your health care provider about your health and preventive screening. It is a chance to share any concerns and updates on your health and to receive a thorough exam.  . Visit your dentist for a routine exam and preventive care every 6 months. Brush your teeth twice a day and floss once a day. Good oral hygiene prevents tooth decay and gum disease.  . The frequency of eye exams is based on your age, health, family medical history, use  of contact lenses, and other factors. Follow your health care provider's ecommendations for frequency of eye exams.  . Eat a healthy diet. Foods like vegetables, fruits, whole grains, low-fat dairy products, and lean protein foods contain the nutrients you  need without too many calories. Decrease your intake of foods high in solid fats, added sugars, and salt. Eat the right amount of calories for you. Get information about a proper diet from your health care provider, if necessary.  . Regular physical exercise is one of the most important things you can do for your health. Most adults should get at least 150 minutes of moderate-intensity exercise (any activity that increases your heart rate and causes you to sweat) each week. In addition, most adults need muscle-strengthening exercises on 2 or more days a week.  Silver Sneakers may be a benefit available to you. To determine eligibility, you may visit the website: www.silversneakers.com or contact program at [phone] Mon-Fri between 8AM-8PM.   . Maintain a healthy weight. The body mass index (BMI) is a screening tool to identify possible weight problems. It provides an estimate of body fat based on height and weight. Your health care provider can find your BMI and can help you achieve or maintain a healthy weight.   For adults 20 years and older: ? A BMI below 18.5 is considered underweight. ? A BMI of 18.5 to 24.9 is normal. ? A BMI of 25 to 29.9 is considered overweight. ? A BMI of 30 and above is considered obese.   . Maintain normal blood lipids and cholesterol levels by exercising and  minimizing your intake of saturated fat. Eat a balanced diet with plenty of fruit and vegetables. Blood tests for lipids and cholesterol should begin at age 71 and be repeated every 5 years. If your lipid or cholesterol levels are high, you are over 50, or you are at high risk for heart disease, you may need your cholesterol levels checked more frequently. Ongoing high lipid and cholesterol levels should be treated with medicines if diet and exercise are not working.  . If you smoke, find out from your health care provider how to quit. If you do not use tobacco, please do not start.  . If you choose to drink  alcohol, please do not consume more than 2 drinks per day. One drink is considered to be 12 ounces (355 mL) of beer, 5 ounces (148 mL) of wine, or 1.5 ounces (44 mL) of liquor.  . If you are 63-24 years old, ask your health care provider if you should take aspirin to prevent strokes.  . Use sunscreen. Apply sunscreen liberally and repeatedly throughout the day. You should seek shade when your shadow is shorter than you. Protect yourself by wearing long sleeves, pants, a wide-brimmed hat, and sunglasses year round, whenever you are outdoors.  . Once a month, do a whole body skin exam, using a mirror to look at the skin on your back. Tell your health care provider of new moles, moles that have irregular borders, moles that are larger than a pencil eraser, or moles that have changed in shape or color.

## 2017-01-30 NOTE — Progress Notes (Signed)
Subjective:   Dennis Wise is a 66 y.o. male who presents for Medicare Annual/Subsequent preventive examination.  Review of Systems:  N/A Cardiac Risk Factors include: advanced age (>33mn, >>73women);obesity (BMI >30kg/m2);diabetes mellitus;dyslipidemia;hypertension;male gender     Objective:    Vitals: BP (!) 142/60 (BP Location: Right Arm, Patient Position: Sitting, Cuff Size: Normal)   Pulse (!) 55   Temp 98.1 F (36.7 C) (Oral)   Ht 6' 0.5" (1.842 m) Comment: no shoes  Wt 285 lb 8 oz (129.5 kg)   SpO2 98%   BMI 38.19 kg/m   Body mass index is 38.19 kg/m.  Tobacco History  Smoking Status  . Never Smoker  Smokeless Tobacco  . Never Used     Counseling given: No   Past Medical History:  Diagnosis Date  . Anemia    Iron deficiency part of this  . Cataract   . Diabetes mellitus    type II  . GERD (gastroesophageal reflux disease) 10/90  . Helicobacter pylori gastritis 06/11/2011   On EGD 05/2011   . Hyperlipidemia   . Hypertension 08/01  . Internal hemorrhoids   . Iron deficiency anemia, unspecified 03/04/2011  . MGUS (monoclonal gammopathy of unknown significance)    possible dx initially, had negative f/u  . Pancytopenia   . Personal history of colonic adenomas 06/05/2012  . Pneumonia    Past Surgical History:  Procedure Laterality Date  . CATARACT EXTRACTION Right 06/2016  . FLEXIBLE SIGMOIDOSCOPY  05/1988   internal hemorrhoids  . KNEE SURGERY  09/04   lt knee fx repair ORIF  . TIBIA FRACTURE SURGERY     left 2012   Family History  Problem Relation Age of Onset  . Skin cancer Brother   . Diabetes Brother   . Emphysema Father   . Cancer Father        ?  .Marland KitchenAlzheimer's disease Father   . Cancer Mother        ?  . Diabetes Mother   . Heart failure Mother   . Colon cancer Neg Hx   . Prostate cancer Neg Hx   . Esophageal cancer Neg Hx   . Rectal cancer Neg Hx   . Stomach cancer Neg Hx    History  Sexual Activity  . Sexual activity: Yes     Outpatient Encounter Prescriptions as of 01/30/2017  Medication Sig  . ACCU-CHEK FASTCLIX LANCETS MISC Use to test blood sugar once daily or as needed.  Diagnosis:  E11.3299  Non insulin dependent.  .Marland Kitchenaspirin 81 MG tablet Take 81 mg by mouth daily.    .Marland Kitchenatorvastatin (LIPITOR) 10 MG tablet TAKE 1 TABLET AT BEDTIME  . Blood Glucose Monitoring Suppl (ACCU-CHEK NANO SMARTVIEW) w/Device KIT Use to check blood sugar once daily or as needed.  Diagnosis: E11.3299  Non insulin dependent.  .Marland Kitchenglimepiride (AMARYL) 2 MG tablet TAKE 1 TABLET EVERY DAY BEFORE BREAKFAST  . glucose blood (ACCU-CHEK SMARTVIEW) test strip Use as instructed to test blood sugar once daily or as needed.  Diagnosis:  E11.3299  Non insulin dependent.  . metFORMIN (GLUCOPHAGE) 850 MG tablet TAKE 1 TABLET TWICE DAILY  . Multiple Vitamin (MULTIVITAMIN) tablet Take 1 tablet by mouth daily.    . niacin (NIASPAN) 500 MG CR tablet TAKE 3 TABLETS AT BEDTIME  . pioglitazone (ACTOS) 45 MG tablet TAKE 1 TABLET EVERY DAY  . ramipril (ALTACE) 5 MG capsule TAKE 1 CAPSULE (5 MG TOTAL) BY MOUTH DAILY.  . [  DISCONTINUED] azithromycin (ZITHROMAX) 250 MG tablet 2 tabs a day for 1 day and then 1 a day for 4 days.  . [DISCONTINUED] benzonatate (TESSALON) 200 MG capsule Take 1 capsule (200 mg total) by mouth 3 (three) times daily as needed for cough.   No facility-administered encounter medications on file as of 01/30/2017.     Activities of Daily Living In your present state of health, do you have any difficulty performing the following activities: 01/30/2017  Hearing? Y  Vision? N  Difficulty concentrating or making decisions? N  Walking or climbing stairs? N  Dressing or bathing? N  Doing errands, shopping? N  Preparing Food and eating ? N  Using the Toilet? N  In the past six months, have you accidently leaked urine? N  Do you have problems with loss of bowel control? N  Managing your Medications? N  Managing your Finances? N   Housekeeping or managing your Housekeeping? N  Some recent data might be hidden    Patient Care Team: [name], MD as PCP - General (Family Medicine) Gearlean Alf, MD as Referring Physician (Ophthalmology) Nicholas Lose, MD as Consulting Physician (Hematology and Oncology)   Assessment:    Hearing Screening Comments: Bilateral hearing aids (does not wear) Vision Screening Comments: Monthly eye exams at [facility]  Exercise Activities and Dietary recommendations Current Exercise Habits: The patient has a physically strenous job, but has no regular exercise apart from work. 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       Fall Risk Fall Risk  01/30/2017 10/22/2015 10/22/2015 10/19/2014 10/14/2013  Falls in the past year? _0    Depression Screen PHQ 2/9 Scores 01/30/2017 10/22/2015 10/22/2015 10/19/2014  PHQ - 2 Score 0 0 0 0  PHQ- 9 Score 0 - - -    Cognitive Function MMSE - Mini Mental State Exam 01/30/2017 10/22/2015  Orientation to time 5 5  Orientation to Place 5 5  Registration 3 3  Attention/ Calculation 0 0  Recall 3 3  Language- name 2 objects 0 0  Language- repeat 1 1  Language- follow 3 step command 3 3  Language- read & follow direction 0 0  Write a sentence 0 0  Copy design 0 0  Total score 20 20       PLEASE NOTE: A Mini-Cog screen was completed. Maximum score is 20. A value of 0 denotes this part of Folstein MMSE was not completed or the patient failed this part of the Mini-Cog screening.   Mini-Cog Screening Orientation to Time - Max 5 pts Orientation to Place - Max 5 pts Registration - Max 3 pts Recall - Max 3 pts Language Repeat - Max 1 pts Language Follow 3 Step Command - Max 3 pts   Immunization History  Administered Date(s)  Administered  . Influenza Split 03/03/2011, 02/03/2012  . Influenza,inj,Quad PF,6+ Mos 02/28/2013, 02/20/2015, 05/12/2016, 01/30/2017  . Pneumococcal Conjugate-13 10/14/2013  . Pneumococcal Polysaccharide-23 08/22/1998, 03/03/2011, 01/30/2017  . Td 08/22/1998, 10/16/2009  . Zoster 02/28/2013   Screening Tests Health Maintenance  Topic Date Due  . FOOT EXAM  10/20/2017 (Originally 10/21/2016)  . HEMOGLOBIN A1C  07/31/2017  . OPHTHALMOLOGY EXAM  01/09/2018  .  COLONOSCOPY  04/07/2018  . TETANUS/TDAP  10/17/2019  . INFLUENZA VACCINE  Completed  . Hepatitis C Screening  Completed  . PNA vac Low Risk Adult  Completed      Plan:   I have personally reviewed and addressed the Medicare Annual Wellness questionnaire and have noted the following in the patient's chart:  A. Medical and social history B. Use of alcohol, tobacco or illicit drugs  C. Current medications and supplements D. Functional ability and status E.  Nutritional status F.  Physical activity G. Advance directives H. List of other physicians I.  Hospitalizations, surgeries, and ER visits in previous 12 months J.  [place] to include hearing, vision, cognitive, depression L. Referrals and appointments - none  In addition, I have reviewed and discussed with patient certain preventive protocols, quality metrics, and best practice recommendations. A written personalized care plan for preventive services as well as general preventive health recommendations were provided to patient.  See attached scanned questionnaire for additional information.   Signed,   Lindell Noe, MHA, BS, LPN Health Coach

## 2017-01-30 NOTE — Progress Notes (Signed)
PCP notes:   Health maintenance:  Foot exam - PCP please address at next appt [id number] - administered Flu vaccine - administered A1C - completed Eye exam - per pt, monthly exams at [facility]  Abnormal screenings:   None  Patient concerns:   None  Nurse concerns:  None  Next PCP appt:   02/02/17 @ 1045 I reviewed health advisor's note, was available for consultation on the day of service listed in this note, and agree with documentation and plan. Elsie Stain, MD.

## 2017-02-02 ENCOUNTER — Ambulatory Visit (INDEPENDENT_AMBULATORY_CARE_PROVIDER_SITE_OTHER): Payer: Medicare HMO | Admitting: Family Medicine

## 2017-02-02 ENCOUNTER — Encounter: Payer: Self-pay | Admitting: Family Medicine

## 2017-02-02 VITALS — BP 138/64 | HR 62 | Temp 98.1°F | Ht 72.5 in | Wt 285.5 lb

## 2017-02-02 DIAGNOSIS — D61818 Other pancytopenia: Secondary | ICD-10-CM

## 2017-02-02 DIAGNOSIS — R21 Rash and other nonspecific skin eruption: Secondary | ICD-10-CM | POA: Diagnosis not present

## 2017-02-02 DIAGNOSIS — E119 Type 2 diabetes mellitus without complications: Secondary | ICD-10-CM

## 2017-02-02 DIAGNOSIS — Z0000 Encounter for general adult medical examination without abnormal findings: Secondary | ICD-10-CM

## 2017-02-02 DIAGNOSIS — Z7189 Other specified counseling: Secondary | ICD-10-CM | POA: Diagnosis not present

## 2017-02-02 DIAGNOSIS — E113299 Type 2 diabetes mellitus with mild nonproliferative diabetic retinopathy without macular edema, unspecified eye: Secondary | ICD-10-CM

## 2017-02-02 DIAGNOSIS — I10 Essential (primary) hypertension: Secondary | ICD-10-CM

## 2017-02-02 DIAGNOSIS — E7800 Pure hypercholesterolemia, unspecified: Secondary | ICD-10-CM

## 2017-02-02 LAB — PROTEIN ELECTROPHORESIS, SERUM
ALBUMIN ELP: 4.1 g/dL (ref 3.8–4.8)
ALPHA 1: 0.3 g/dL (ref 0.2–0.3)
Alpha 2: 0.7 g/dL (ref 0.5–0.9)
BETA GLOBULIN: 0.5 g/dL (ref 0.4–0.6)
Beta 2: 0.3 g/dL (ref 0.2–0.5)
Gamma Globulin: 0.8 g/dL (ref 0.8–1.7)
TOTAL PROTEIN: 6.7 g/dL (ref 6.1–8.1)

## 2017-02-02 MED ORDER — PIOGLITAZONE HCL 45 MG PO TABS: 45.0000 mg | ORAL_TABLET | Freq: Every day | ORAL | 3 refills | Status: DC

## 2017-02-02 MED ORDER — GLIMEPIRIDE 2 MG PO TABS: ORAL_TABLET | ORAL | 3 refills | Status: DC

## 2017-02-02 MED ORDER — METFORMIN HCL 850 MG PO TABS: 850.0000 mg | ORAL_TABLET | Freq: Two times a day (BID) | ORAL | 3 refills | Status: DC

## 2017-02-02 MED ORDER — ATORVASTATIN CALCIUM 10 MG PO TABS: 10.0000 mg | ORAL_TABLET | Freq: Every day | ORAL | 3 refills | Status: DC

## 2017-02-02 MED ORDER — RAMIPRIL 5 MG PO CAPS: 5.0000 mg | ORAL_CAPSULE | Freq: Every day | ORAL | 3 refills | Status: DC

## 2017-02-02 NOTE — Progress Notes (Signed)
Diabetes:  Using medications without difficulties:yes Hypoglycemic episodes:no Hyperglycemic episodes:no Feet problems:  No tingling.   Blood Sugars averaging: ~150s eye exam within last year: yes, per [facility].   Diet and exercise d/w pt.  Diet is the main issue for patient at this point, meaning the area needing most improvement.   Hypertension:    Using medication without problems or lightheadedness: yes Chest pain with exertion:no Edema:occ L>R leg edema with prolonged sitting, but not consistent Short of breath:no  Elevated Cholesterol: Using medications without problems:yes Muscle aches: no Diet compliance: encouraged Exercise:encouraged  H/o possible MGUS vs anemia.  With f/u SPEP pending.  Other labs d/w pt.  Mild anemia noted. Stable. Mild decrease in platelets noted, stable. No spontaneous bleeding. D/w pt.   Vaccines d/w pt.  shingrix d/w pt.  Colon 2014  Prostate cancer screening and PSA options (with potential risks and benefits of testing vs not testing) were discussed along with recent recs/guidelines. He declined testing PSA at this point.  Advance directive- wife designated if patient were incapacitated  PMH and SH reviewed.   Vital signs, Meds and allergies reviewed.  ROS: Per HPI unless specifically indicated in ROS section   GEN: nad, alert and oriented HEENT: mucous membranes moist NECK: supple w/o LA CV: rrr.  no murmur PULM: ctab, no inc wob ABD: soft, +bs EXT: no edema SKIN: no acute rash but chronic changes on the L lower shin  Diabetic foot exam: Normal inspection except for healed blister on base of dorsal L 3rd toe No skin breakdown except as above.  No calluses  Normal DP pulses Normal sensation to light tough but dec in monofilament sensation on dorsum of B feet- dec but not absent.  Nails normal except L 2nd nail thick

## 2017-02-02 NOTE — Patient Instructions (Signed)
Work on [employer] and recheck A1c in about 6 months.  Take care.  Glad to see you.  Update me as needed.

## 2017-02-03 ENCOUNTER — Ambulatory Visit: Payer: Commercial Managed Care - HMO | Admitting: Hematology and Oncology

## 2017-02-03 DIAGNOSIS — Z0000 Encounter for general adult medical examination without abnormal findings: Secondary | ICD-10-CM | POA: Insufficient documentation

## 2017-02-03 DIAGNOSIS — R21 Rash and other nonspecific skin eruption: Secondary | ICD-10-CM | POA: Insufficient documentation

## 2017-02-03 NOTE — Assessment & Plan Note (Addendum)
He had a history of possible MGUS, but this is thought to be not likely. CBC stable. White count is low normal. Mild anemia and mild thrombocytopenia noted. No spontaneous bleeding. SPEP was pending at time of office visit. Resulted afterward, normal distribution, meaning no M spike. Okay for outpatient follow-up.  >25 minutes spent in face to face time with patient, >50% spent in counselling or coordination of care.

## 2017-02-03 NOTE — Assessment & Plan Note (Signed)
Vaccines d/w pt.  shingrix d/w pt.  Colon 2014  Prostate cancer screening and PSA options (with potential risks and benefits of testing vs not testing) were discussed along with recent recs/guidelines. He declined testing PSA at this point.  Advance directive- wife designated if patient were incapacitated

## 2017-02-03 NOTE — Assessment & Plan Note (Signed)
Reasonable control. Continue as is. Labs discussed with patient. He agrees.

## 2017-02-03 NOTE — Assessment & Plan Note (Signed)
Diet and exercise d/w pt.  Diet is the main issue for patient at this point, meaning the area needing most improvement.  With neuropathy symptoms in the feet with decrease in sensation noted. Discussed with patient about foot care. No change in meds. Continue work on exercise. He will work more on diet. Recheck labs as scheduled. See after visit summary.

## 2017-02-03 NOTE — Assessment & Plan Note (Signed)
Looks to be mild changes from venous stasis on the left shin. Discussed with patient about local care and compression stocking as needed. He agrees. Update me if worsening.

## 2017-02-03 NOTE — Assessment & Plan Note (Signed)
Continue statin. Encouraged work on diet and exercise. Labs discussed with patient. He agrees.

## 2017-02-03 NOTE — Assessment & Plan Note (Signed)
Advance directive- wife designated if patient were incapacitated.  

## 2017-02-06 DIAGNOSIS — H2512 Age-related nuclear cataract, left eye: Secondary | ICD-10-CM | POA: Diagnosis not present

## 2017-02-06 DIAGNOSIS — E7800 Pure hypercholesterolemia, unspecified: Secondary | ICD-10-CM | POA: Diagnosis not present

## 2017-02-06 DIAGNOSIS — Z9842 Cataract extraction status, left eye: Secondary | ICD-10-CM | POA: Diagnosis not present

## 2017-02-06 DIAGNOSIS — H02831 Dermatochalasis of right upper eyelid: Secondary | ICD-10-CM | POA: Diagnosis not present

## 2017-02-06 DIAGNOSIS — H43813 Vitreous degeneration, bilateral: Secondary | ICD-10-CM | POA: Diagnosis not present

## 2017-02-06 DIAGNOSIS — H02834 Dermatochalasis of left upper eyelid: Secondary | ICD-10-CM | POA: Diagnosis not present

## 2017-02-06 DIAGNOSIS — E113511 Type 2 diabetes mellitus with proliferative diabetic retinopathy with macular edema, right eye: Secondary | ICD-10-CM | POA: Diagnosis not present

## 2017-02-06 DIAGNOSIS — E113513 Type 2 diabetes mellitus with proliferative diabetic retinopathy with macular edema, bilateral: Secondary | ICD-10-CM | POA: Diagnosis not present

## 2017-02-06 DIAGNOSIS — Z961 Presence of intraocular lens: Secondary | ICD-10-CM | POA: Diagnosis not present

## 2017-02-06 DIAGNOSIS — I10 Essential (primary) hypertension: Secondary | ICD-10-CM | POA: Diagnosis not present

## 2017-03-16 DIAGNOSIS — H02834 Dermatochalasis of left upper eyelid: Secondary | ICD-10-CM | POA: Diagnosis not present

## 2017-03-16 DIAGNOSIS — E113511 Type 2 diabetes mellitus with proliferative diabetic retinopathy with macular edema, right eye: Secondary | ICD-10-CM | POA: Diagnosis not present

## 2017-03-16 DIAGNOSIS — H2512 Age-related nuclear cataract, left eye: Secondary | ICD-10-CM | POA: Diagnosis not present

## 2017-03-16 DIAGNOSIS — E113513 Type 2 diabetes mellitus with proliferative diabetic retinopathy with macular edema, bilateral: Secondary | ICD-10-CM | POA: Diagnosis not present

## 2017-03-16 DIAGNOSIS — Z7984 Long term (current) use of oral hypoglycemic drugs: Secondary | ICD-10-CM | POA: Diagnosis not present

## 2017-03-16 DIAGNOSIS — H02831 Dermatochalasis of right upper eyelid: Secondary | ICD-10-CM | POA: Diagnosis not present

## 2017-03-16 DIAGNOSIS — Z961 Presence of intraocular lens: Secondary | ICD-10-CM | POA: Diagnosis not present

## 2017-04-09 DIAGNOSIS — H02831 Dermatochalasis of right upper eyelid: Secondary | ICD-10-CM | POA: Diagnosis not present

## 2017-04-09 DIAGNOSIS — H25812 Combined forms of age-related cataract, left eye: Secondary | ICD-10-CM | POA: Diagnosis not present

## 2017-04-09 DIAGNOSIS — H2512 Age-related nuclear cataract, left eye: Secondary | ICD-10-CM | POA: Diagnosis not present

## 2017-04-09 DIAGNOSIS — Z7984 Long term (current) use of oral hypoglycemic drugs: Secondary | ICD-10-CM | POA: Diagnosis not present

## 2017-04-09 DIAGNOSIS — Z961 Presence of intraocular lens: Secondary | ICD-10-CM | POA: Diagnosis not present

## 2017-04-09 DIAGNOSIS — E113513 Type 2 diabetes mellitus with proliferative diabetic retinopathy with macular edema, bilateral: Secondary | ICD-10-CM | POA: Diagnosis not present

## 2017-04-09 DIAGNOSIS — H52203 Unspecified astigmatism, bilateral: Secondary | ICD-10-CM | POA: Diagnosis not present

## 2017-04-09 DIAGNOSIS — H02834 Dermatochalasis of left upper eyelid: Secondary | ICD-10-CM | POA: Diagnosis not present

## 2017-04-09 DIAGNOSIS — H26491 Other secondary cataract, right eye: Secondary | ICD-10-CM | POA: Diagnosis not present

## 2017-04-09 DIAGNOSIS — H524 Presbyopia: Secondary | ICD-10-CM | POA: Diagnosis not present

## 2017-04-09 DIAGNOSIS — H5213 Myopia, bilateral: Secondary | ICD-10-CM | POA: Diagnosis not present

## 2017-04-10 DIAGNOSIS — H02831 Dermatochalasis of right upper eyelid: Secondary | ICD-10-CM | POA: Diagnosis not present

## 2017-04-10 DIAGNOSIS — H02834 Dermatochalasis of left upper eyelid: Secondary | ICD-10-CM | POA: Diagnosis not present

## 2017-04-10 DIAGNOSIS — Z961 Presence of intraocular lens: Secondary | ICD-10-CM | POA: Diagnosis not present

## 2017-04-10 DIAGNOSIS — H2512 Age-related nuclear cataract, left eye: Secondary | ICD-10-CM | POA: Diagnosis not present

## 2017-04-10 DIAGNOSIS — E113513 Type 2 diabetes mellitus with proliferative diabetic retinopathy with macular edema, bilateral: Secondary | ICD-10-CM | POA: Diagnosis not present

## 2017-04-22 DIAGNOSIS — Z9841 Cataract extraction status, right eye: Secondary | ICD-10-CM | POA: Diagnosis not present

## 2017-04-22 DIAGNOSIS — E669 Obesity, unspecified: Secondary | ICD-10-CM | POA: Diagnosis not present

## 2017-04-22 DIAGNOSIS — E7800 Pure hypercholesterolemia, unspecified: Secondary | ICD-10-CM | POA: Diagnosis not present

## 2017-04-22 DIAGNOSIS — H25812 Combined forms of age-related cataract, left eye: Secondary | ICD-10-CM | POA: Diagnosis not present

## 2017-04-22 DIAGNOSIS — I10 Essential (primary) hypertension: Secondary | ICD-10-CM | POA: Diagnosis not present

## 2017-04-22 DIAGNOSIS — H2512 Age-related nuclear cataract, left eye: Secondary | ICD-10-CM | POA: Diagnosis not present

## 2017-04-22 DIAGNOSIS — K219 Gastro-esophageal reflux disease without esophagitis: Secondary | ICD-10-CM | POA: Diagnosis not present

## 2017-04-22 DIAGNOSIS — E113513 Type 2 diabetes mellitus with proliferative diabetic retinopathy with macular edema, bilateral: Secondary | ICD-10-CM | POA: Diagnosis not present

## 2017-04-22 DIAGNOSIS — Z888 Allergy status to other drugs, medicaments and biological substances status: Secondary | ICD-10-CM | POA: Diagnosis not present

## 2017-04-22 DIAGNOSIS — Z961 Presence of intraocular lens: Secondary | ICD-10-CM | POA: Diagnosis not present

## 2017-04-23 DIAGNOSIS — H524 Presbyopia: Secondary | ICD-10-CM | POA: Diagnosis not present

## 2017-04-23 DIAGNOSIS — H5213 Myopia, bilateral: Secondary | ICD-10-CM | POA: Diagnosis not present

## 2017-04-23 DIAGNOSIS — Z7984 Long term (current) use of oral hypoglycemic drugs: Secondary | ICD-10-CM | POA: Diagnosis not present

## 2017-04-23 DIAGNOSIS — H02831 Dermatochalasis of right upper eyelid: Secondary | ICD-10-CM | POA: Diagnosis not present

## 2017-04-23 DIAGNOSIS — E113513 Type 2 diabetes mellitus with proliferative diabetic retinopathy with macular edema, bilateral: Secondary | ICD-10-CM | POA: Diagnosis not present

## 2017-04-23 DIAGNOSIS — H02834 Dermatochalasis of left upper eyelid: Secondary | ICD-10-CM | POA: Diagnosis not present

## 2017-04-23 DIAGNOSIS — H52203 Unspecified astigmatism, bilateral: Secondary | ICD-10-CM | POA: Diagnosis not present

## 2017-04-23 DIAGNOSIS — H25812 Combined forms of age-related cataract, left eye: Secondary | ICD-10-CM | POA: Diagnosis not present

## 2017-04-23 DIAGNOSIS — H26491 Other secondary cataract, right eye: Secondary | ICD-10-CM | POA: Diagnosis not present

## 2017-04-24 DIAGNOSIS — Z7984 Long term (current) use of oral hypoglycemic drugs: Secondary | ICD-10-CM | POA: Diagnosis not present

## 2017-04-24 DIAGNOSIS — Z961 Presence of intraocular lens: Secondary | ICD-10-CM | POA: Diagnosis not present

## 2017-04-24 DIAGNOSIS — H02834 Dermatochalasis of left upper eyelid: Secondary | ICD-10-CM | POA: Diagnosis not present

## 2017-04-24 DIAGNOSIS — E113513 Type 2 diabetes mellitus with proliferative diabetic retinopathy with macular edema, bilateral: Secondary | ICD-10-CM | POA: Diagnosis not present

## 2017-04-24 DIAGNOSIS — H02831 Dermatochalasis of right upper eyelid: Secondary | ICD-10-CM | POA: Diagnosis not present

## 2017-05-08 DIAGNOSIS — H26491 Other secondary cataract, right eye: Secondary | ICD-10-CM | POA: Diagnosis not present

## 2017-05-08 DIAGNOSIS — H02834 Dermatochalasis of left upper eyelid: Secondary | ICD-10-CM | POA: Diagnosis not present

## 2017-05-08 DIAGNOSIS — E113513 Type 2 diabetes mellitus with proliferative diabetic retinopathy with macular edema, bilateral: Secondary | ICD-10-CM | POA: Diagnosis not present

## 2017-05-08 DIAGNOSIS — Z48810 Encounter for surgical aftercare following surgery on the sense organs: Secondary | ICD-10-CM | POA: Diagnosis not present

## 2017-05-08 DIAGNOSIS — H02831 Dermatochalasis of right upper eyelid: Secondary | ICD-10-CM | POA: Diagnosis not present

## 2017-05-08 DIAGNOSIS — H5213 Myopia, bilateral: Secondary | ICD-10-CM | POA: Diagnosis not present

## 2017-05-08 DIAGNOSIS — H25812 Combined forms of age-related cataract, left eye: Secondary | ICD-10-CM | POA: Diagnosis not present

## 2017-05-08 DIAGNOSIS — H52203 Unspecified astigmatism, bilateral: Secondary | ICD-10-CM | POA: Diagnosis not present

## 2017-05-08 DIAGNOSIS — H524 Presbyopia: Secondary | ICD-10-CM | POA: Diagnosis not present

## 2017-05-22 DIAGNOSIS — Z7984 Long term (current) use of oral hypoglycemic drugs: Secondary | ICD-10-CM | POA: Diagnosis not present

## 2017-05-22 DIAGNOSIS — H35371 Puckering of macula, right eye: Secondary | ICD-10-CM | POA: Diagnosis not present

## 2017-05-22 DIAGNOSIS — H02831 Dermatochalasis of right upper eyelid: Secondary | ICD-10-CM | POA: Diagnosis not present

## 2017-05-22 DIAGNOSIS — Z888 Allergy status to other drugs, medicaments and biological substances status: Secondary | ICD-10-CM | POA: Diagnosis not present

## 2017-05-22 DIAGNOSIS — E113513 Type 2 diabetes mellitus with proliferative diabetic retinopathy with macular edema, bilateral: Secondary | ICD-10-CM | POA: Diagnosis not present

## 2017-05-22 DIAGNOSIS — H02834 Dermatochalasis of left upper eyelid: Secondary | ICD-10-CM | POA: Diagnosis not present

## 2017-05-22 DIAGNOSIS — Z961 Presence of intraocular lens: Secondary | ICD-10-CM | POA: Diagnosis not present

## 2017-06-12 DIAGNOSIS — H52203 Unspecified astigmatism, bilateral: Secondary | ICD-10-CM | POA: Diagnosis not present

## 2017-06-12 DIAGNOSIS — H02834 Dermatochalasis of left upper eyelid: Secondary | ICD-10-CM | POA: Diagnosis not present

## 2017-06-12 DIAGNOSIS — H524 Presbyopia: Secondary | ICD-10-CM | POA: Diagnosis not present

## 2017-06-12 DIAGNOSIS — H02831 Dermatochalasis of right upper eyelid: Secondary | ICD-10-CM | POA: Diagnosis not present

## 2017-06-12 DIAGNOSIS — Z961 Presence of intraocular lens: Secondary | ICD-10-CM | POA: Diagnosis not present

## 2017-06-12 DIAGNOSIS — H25812 Combined forms of age-related cataract, left eye: Secondary | ICD-10-CM | POA: Diagnosis not present

## 2017-06-12 DIAGNOSIS — Z48810 Encounter for surgical aftercare following surgery on the sense organs: Secondary | ICD-10-CM | POA: Diagnosis not present

## 2017-06-12 DIAGNOSIS — E113513 Type 2 diabetes mellitus with proliferative diabetic retinopathy with macular edema, bilateral: Secondary | ICD-10-CM | POA: Diagnosis not present

## 2017-06-12 DIAGNOSIS — H5213 Myopia, bilateral: Secondary | ICD-10-CM | POA: Diagnosis not present

## 2017-06-16 DIAGNOSIS — X32XXXA Exposure to sunlight, initial encounter: Secondary | ICD-10-CM | POA: Diagnosis not present

## 2017-06-16 DIAGNOSIS — D2271 Melanocytic nevi of right lower limb, including hip: Secondary | ICD-10-CM | POA: Diagnosis not present

## 2017-06-16 DIAGNOSIS — D2262 Melanocytic nevi of left upper limb, including shoulder: Secondary | ICD-10-CM | POA: Diagnosis not present

## 2017-06-16 DIAGNOSIS — Z85828 Personal history of other malignant neoplasm of skin: Secondary | ICD-10-CM | POA: Diagnosis not present

## 2017-06-16 DIAGNOSIS — D225 Melanocytic nevi of trunk: Secondary | ICD-10-CM | POA: Diagnosis not present

## 2017-06-16 DIAGNOSIS — L570 Actinic keratosis: Secondary | ICD-10-CM | POA: Diagnosis not present

## 2017-06-19 DIAGNOSIS — Z961 Presence of intraocular lens: Secondary | ICD-10-CM | POA: Diagnosis not present

## 2017-06-19 DIAGNOSIS — E113513 Type 2 diabetes mellitus with proliferative diabetic retinopathy with macular edema, bilateral: Secondary | ICD-10-CM | POA: Diagnosis not present

## 2017-06-19 DIAGNOSIS — H02831 Dermatochalasis of right upper eyelid: Secondary | ICD-10-CM | POA: Diagnosis not present

## 2017-06-19 DIAGNOSIS — Z7984 Long term (current) use of oral hypoglycemic drugs: Secondary | ICD-10-CM | POA: Diagnosis not present

## 2017-06-19 DIAGNOSIS — H02834 Dermatochalasis of left upper eyelid: Secondary | ICD-10-CM | POA: Diagnosis not present

## 2017-07-27 DIAGNOSIS — Z961 Presence of intraocular lens: Secondary | ICD-10-CM | POA: Diagnosis not present

## 2017-07-27 DIAGNOSIS — H02831 Dermatochalasis of right upper eyelid: Secondary | ICD-10-CM | POA: Diagnosis not present

## 2017-07-27 DIAGNOSIS — H02834 Dermatochalasis of left upper eyelid: Secondary | ICD-10-CM | POA: Diagnosis not present

## 2017-07-27 DIAGNOSIS — E113513 Type 2 diabetes mellitus with proliferative diabetic retinopathy with macular edema, bilateral: Secondary | ICD-10-CM | POA: Diagnosis not present

## 2017-07-27 DIAGNOSIS — Z7984 Long term (current) use of oral hypoglycemic drugs: Secondary | ICD-10-CM | POA: Diagnosis not present

## 2017-08-04 ENCOUNTER — Other Ambulatory Visit: Payer: Self-pay | Admitting: *Deleted

## 2017-08-04 ENCOUNTER — Other Ambulatory Visit (INDEPENDENT_AMBULATORY_CARE_PROVIDER_SITE_OTHER): Payer: Medicare HMO

## 2017-08-04 DIAGNOSIS — E119 Type 2 diabetes mellitus without complications: Secondary | ICD-10-CM

## 2017-08-04 LAB — HEMOGLOBIN A1C: HEMOGLOBIN A1C: 7.6 % — AB (ref 4.6–6.5)

## 2017-08-04 MED ORDER — GLUCOSE BLOOD VI STRP: ORAL_STRIP | 3 refills | Status: DC

## 2017-08-06 ENCOUNTER — Other Ambulatory Visit: Payer: Self-pay | Admitting: *Deleted

## 2017-08-06 MED ORDER — GLUCOSE BLOOD VI STRP: ORAL_STRIP | 3 refills | Status: DC

## 2017-09-07 DIAGNOSIS — Z961 Presence of intraocular lens: Secondary | ICD-10-CM | POA: Diagnosis not present

## 2017-09-07 DIAGNOSIS — E113513 Type 2 diabetes mellitus with proliferative diabetic retinopathy with macular edema, bilateral: Secondary | ICD-10-CM | POA: Diagnosis not present

## 2017-09-07 DIAGNOSIS — H02831 Dermatochalasis of right upper eyelid: Secondary | ICD-10-CM | POA: Diagnosis not present

## 2017-09-07 DIAGNOSIS — Z7984 Long term (current) use of oral hypoglycemic drugs: Secondary | ICD-10-CM | POA: Diagnosis not present

## 2017-09-07 DIAGNOSIS — H02836 Dermatochalasis of left eye, unspecified eyelid: Secondary | ICD-10-CM | POA: Diagnosis not present

## 2017-09-07 DIAGNOSIS — H02833 Dermatochalasis of right eye, unspecified eyelid: Secondary | ICD-10-CM | POA: Diagnosis not present

## 2017-09-07 DIAGNOSIS — H02834 Dermatochalasis of left upper eyelid: Secondary | ICD-10-CM | POA: Diagnosis not present

## 2017-10-12 DIAGNOSIS — E113513 Type 2 diabetes mellitus with proliferative diabetic retinopathy with macular edema, bilateral: Secondary | ICD-10-CM | POA: Diagnosis not present

## 2017-10-12 DIAGNOSIS — H02834 Dermatochalasis of left upper eyelid: Secondary | ICD-10-CM | POA: Diagnosis not present

## 2017-10-12 DIAGNOSIS — Z961 Presence of intraocular lens: Secondary | ICD-10-CM | POA: Diagnosis not present

## 2017-10-12 DIAGNOSIS — Z7984 Long term (current) use of oral hypoglycemic drugs: Secondary | ICD-10-CM | POA: Diagnosis not present

## 2017-10-12 DIAGNOSIS — H02831 Dermatochalasis of right upper eyelid: Secondary | ICD-10-CM | POA: Diagnosis not present

## 2017-11-09 DIAGNOSIS — Z9842 Cataract extraction status, left eye: Secondary | ICD-10-CM | POA: Diagnosis not present

## 2017-11-09 DIAGNOSIS — H02834 Dermatochalasis of left upper eyelid: Secondary | ICD-10-CM | POA: Diagnosis not present

## 2017-11-09 DIAGNOSIS — H02831 Dermatochalasis of right upper eyelid: Secondary | ICD-10-CM | POA: Diagnosis not present

## 2017-11-09 DIAGNOSIS — E113513 Type 2 diabetes mellitus with proliferative diabetic retinopathy with macular edema, bilateral: Secondary | ICD-10-CM | POA: Diagnosis not present

## 2017-11-09 DIAGNOSIS — Z7984 Long term (current) use of oral hypoglycemic drugs: Secondary | ICD-10-CM | POA: Diagnosis not present

## 2017-11-09 DIAGNOSIS — Z961 Presence of intraocular lens: Secondary | ICD-10-CM | POA: Diagnosis not present

## 2017-11-09 DIAGNOSIS — Z9889 Other specified postprocedural states: Secondary | ICD-10-CM | POA: Diagnosis not present

## 2017-11-09 DIAGNOSIS — Z9841 Cataract extraction status, right eye: Secondary | ICD-10-CM | POA: Diagnosis not present

## 2018-01-04 DIAGNOSIS — H02831 Dermatochalasis of right upper eyelid: Secondary | ICD-10-CM | POA: Diagnosis not present

## 2018-01-04 DIAGNOSIS — E113513 Type 2 diabetes mellitus with proliferative diabetic retinopathy with macular edema, bilateral: Secondary | ICD-10-CM | POA: Diagnosis not present

## 2018-01-04 DIAGNOSIS — Z7984 Long term (current) use of oral hypoglycemic drugs: Secondary | ICD-10-CM | POA: Diagnosis not present

## 2018-01-04 DIAGNOSIS — Z961 Presence of intraocular lens: Secondary | ICD-10-CM | POA: Diagnosis not present

## 2018-01-04 DIAGNOSIS — H02834 Dermatochalasis of left upper eyelid: Secondary | ICD-10-CM | POA: Diagnosis not present

## 2018-02-01 ENCOUNTER — Other Ambulatory Visit: Payer: Self-pay | Admitting: Family Medicine

## 2018-02-01 DIAGNOSIS — E119 Type 2 diabetes mellitus without complications: Secondary | ICD-10-CM

## 2018-02-01 DIAGNOSIS — D472 Monoclonal gammopathy: Secondary | ICD-10-CM

## 2018-02-02 ENCOUNTER — Other Ambulatory Visit (INDEPENDENT_AMBULATORY_CARE_PROVIDER_SITE_OTHER): Payer: Medicare HMO

## 2018-02-02 DIAGNOSIS — D472 Monoclonal gammopathy: Secondary | ICD-10-CM

## 2018-02-02 DIAGNOSIS — E119 Type 2 diabetes mellitus without complications: Secondary | ICD-10-CM

## 2018-02-02 LAB — COMPREHENSIVE METABOLIC PANEL
ALBUMIN: 4.1 g/dL (ref 3.5–5.2)
ALK PHOS: 90 U/L (ref 39–117)
ALT: 24 U/L (ref 0–53)
AST: 24 U/L (ref 0–37)
BUN: 23 mg/dL (ref 6–23)
CO2: 29 mEq/L (ref 19–32)
CREATININE: 1.17 mg/dL (ref 0.40–1.50)
Calcium: 9.5 mg/dL (ref 8.4–10.5)
Chloride: 103 mEq/L (ref 96–112)
GFR: 65.58 mL/min (ref >60.00)
GLUCOSE: 148 mg/dL — AB (ref 70–99)
POTASSIUM: 4.6 meq/L (ref 3.5–5.1)
SODIUM: 139 meq/L (ref 135–145)
Total Bilirubin: 0.8 mg/dL (ref 0.2–1.2)
Total Protein: 6.7 g/dL (ref 6.0–8.3)

## 2018-02-02 LAB — CBC WITH DIFFERENTIAL/PLATELET
Basophils Absolute: 0 10*3/uL (ref 0.0–0.1)
Basophils Relative: 0.5 % (ref 0.0–3.0)
EOS ABS: 0.1 10*3/uL (ref 0.0–0.7)
EOS PCT: 2.8 % (ref 0.0–5.0)
HCT: 36.6 % — ABNORMAL LOW (ref 39.0–52.0)
Hemoglobin: 12.5 g/dL — ABNORMAL LOW (ref 13.0–17.0)
LYMPHS ABS: 1.3 10*3/uL (ref 0.7–4.0)
Lymphocytes Relative: 31.5 % (ref 12.0–46.0)
MCHC: 34 g/dL (ref 30.0–36.0)
MCV: 88.2 fl (ref 78.0–100.0)
MONO ABS: 0.3 10*3/uL (ref 0.1–1.0)
Monocytes Relative: 8.7 % (ref 3.0–12.0)
NEUTROS PCT: 56.5 % (ref 43.0–77.0)
Neutro Abs: 2.3 10*3/uL (ref 1.4–7.7)
Platelets: 103 10*3/uL — ABNORMAL LOW (ref 150.0–400.0)
RBC: 4.15 Mil/uL — ABNORMAL LOW (ref 4.22–5.81)
RDW: 14.1 % (ref 11.5–15.5)
WBC: 4 10*3/uL (ref 4.0–10.5)

## 2018-02-02 LAB — LIPID PANEL
CHOLESTEROL: 147 mg/dL (ref 0–200)
HDL: 78.8 mg/dL (ref >39.00)
LDL Cholesterol: 51 mg/dL (ref 0–99)
NONHDL: 67.9
Total CHOL/HDL Ratio: 2
Triglycerides: 85 mg/dL (ref 0.0–149.0)
VLDL: 17 mg/dL (ref 0.0–40.0)

## 2018-02-02 LAB — HEMOGLOBIN A1C: HEMOGLOBIN A1C: 8.3 % — AB (ref 4.6–6.5)

## 2018-02-04 LAB — PROTEIN ELECTROPHORESIS, SERUM
ALBUMIN ELP: 3.8 g/dL (ref 3.8–4.8)
ALPHA 1: 0.3 g/dL (ref 0.2–0.3)
ALPHA 2: 0.6 g/dL (ref 0.5–0.9)
BETA GLOBULIN: 0.5 g/dL (ref 0.4–0.6)
Beta 2: 0.3 g/dL (ref 0.2–0.5)
Gamma Globulin: 0.9 g/dL (ref 0.8–1.7)
Total Protein: 6.3 g/dL (ref 6.1–8.1)

## 2018-02-09 ENCOUNTER — Ambulatory Visit (INDEPENDENT_AMBULATORY_CARE_PROVIDER_SITE_OTHER): Payer: Medicare HMO | Admitting: Family Medicine

## 2018-02-09 ENCOUNTER — Encounter: Payer: Self-pay | Admitting: Family Medicine

## 2018-02-09 VITALS — BP 142/56 | HR 66 | Temp 98.4°F | Ht 72.5 in | Wt 286.8 lb

## 2018-02-09 DIAGNOSIS — Z0000 Encounter for general adult medical examination without abnormal findings: Secondary | ICD-10-CM | POA: Diagnosis not present

## 2018-02-09 DIAGNOSIS — Z23 Encounter for immunization: Secondary | ICD-10-CM | POA: Diagnosis not present

## 2018-02-09 DIAGNOSIS — E669 Obesity, unspecified: Secondary | ICD-10-CM | POA: Diagnosis not present

## 2018-02-09 DIAGNOSIS — D61818 Other pancytopenia: Secondary | ICD-10-CM | POA: Diagnosis not present

## 2018-02-09 DIAGNOSIS — E113299 Type 2 diabetes mellitus with mild nonproliferative diabetic retinopathy without macular edema, unspecified eye: Secondary | ICD-10-CM

## 2018-02-09 DIAGNOSIS — Z7189 Other specified counseling: Secondary | ICD-10-CM

## 2018-02-09 DIAGNOSIS — M79609 Pain in unspecified limb: Secondary | ICD-10-CM | POA: Diagnosis not present

## 2018-02-09 DIAGNOSIS — I10 Essential (primary) hypertension: Secondary | ICD-10-CM | POA: Diagnosis not present

## 2018-02-09 DIAGNOSIS — E7800 Pure hypercholesterolemia, unspecified: Secondary | ICD-10-CM | POA: Diagnosis not present

## 2018-02-09 MED ORDER — PIOGLITAZONE HCL 45 MG PO TABS: 45.0000 mg | ORAL_TABLET | Freq: Every day | ORAL | 3 refills | Status: DC

## 2018-02-09 MED ORDER — RAMIPRIL 5 MG PO CAPS: 5.0000 mg | ORAL_CAPSULE | Freq: Every day | ORAL | 3 refills | Status: DC

## 2018-02-09 MED ORDER — GLIMEPIRIDE 2 MG PO TABS: ORAL_TABLET | ORAL | 3 refills | Status: DC

## 2018-02-09 MED ORDER — ATORVASTATIN CALCIUM 10 MG PO TABS: 10.0000 mg | ORAL_TABLET | Freq: Every day | ORAL | 3 refills | Status: DC

## 2018-02-09 MED ORDER — METFORMIN HCL 850 MG PO TABS: 850.0000 mg | ORAL_TABLET | Freq: Two times a day (BID) | ORAL | 3 refills | Status: DC

## 2018-02-09 NOTE — Progress Notes (Signed)
I have personally reviewed the Medicare Annual Wellness questionnaire and have noted 1. The patient's medical and social history 2. Their use of alcohol, tobacco or illicit drugs 3. Their current medications and supplements 4. The patient's functional ability including ADL's, fall risks, home safety risks and hearing or visual             impairment. 5. Diet and physical activities 6. Evidence for depression or mood disorders  The patients weight, height, BMI have been recorded in the chart and visual acuity is per eye clinic.  I have made referrals, counseling and provided education to the patient based review of the above and I have provided the pt with a written personalized care plan for preventive services.  Provider list updated- see scanned forms.  Routine anticipatory guidance given to patient.  See health maintenance. The possibility exists that previously documented standard health maintenance information may have been brought forward from a previous encounter into this note.  If needed, that same information has been updated to reflect the current situation based on today's encounter.    Flu 2019 Shingles out of stock, discussed with patient PNA up-to-date Tetanus 2011 Colonoscopy 2014.  Discussed with patient.  See after visit summary. Prostate cancer screening and PSA options (with potential risks and benefits of testing vs not testing) were discussed along with recent recs/guidelines.  He declined testing PSA at this point. Advance directive -wife designated if patient were incapacitated Cognitive function addressed- see scanned forms- and if abnormal then additional documentation follows.   Diabetes:  Using medications without difficulties: yes Hypoglycemic episodes: only if prolonged fasting.   Hyperglycemic episodes:no Feet problems: some occ tingling in the feet exacerbated by pronation Blood Sugars averaging:  60-120s, with some occ elevation eye exam within last year:  yes We talked about actos.  At this point, there is no clear evidence to direct a change in meds (re: actos).  Med is tolerated (no h/o bladder CA and no h/o blood in urine) and benefit from control of DM2 outweighs other considerations.  Pt agrees to continue actos.    Possible MGUS.  No FCNAVD.  He feels well. Labs d/w pt.    Elevated Cholesterol: Using medications without problems: yes Muscle aches: no Diet compliance:encouraged.  Exercise: encouraged.  Labs d/w pt.    Hypertension:    Using medication without problems or lightheadedness: occ with sudden position changes, cautions d/w pt.  Chest pain with exertion:no Edema:no Short of breath:no  PMH and SH reviewed  Meds, vitals, and allergies reviewed.   ROS: Per HPI.  Unless specifically indicated otherwise in HPI, the patient denies:  General: fever. Eyes: acute vision changes ENT: sore throat Cardiovascular: chest pain Respiratory: SOB GI: vomiting GU: dysuria Musculoskeletal: acute back pain Derm: acute rash Neuro: acute motor dysfunction Psych: worsening mood Endocrine: polydipsia Heme: bleeding Allergy: hayfever  GEN: nad, alert and oriented HEENT: mucous membranes moist NECK: supple w/o LA CV: rrr. PULM: ctab, no inc wob ABD: soft, +bs EXT: no edema SKIN: no acute rash  Diabetic foot exam: Normal inspection except for drop metatarsal heads noted on the right foot No skin breakdown No calluses  Normal DP pulses Normal sensation to light touch and monofilament Nails normal

## 2018-02-09 NOTE — Patient Instructions (Addendum)
Call GI at the end of the year if they don't call you first.  Recheck in about 3 months.  The only lab you need to have done for your next diabetic visit is an A1c.  We can do this with a fingerstick test at the office visit.  You do not need a lab visit ahead of time for this.  It does not matter if you are fasting when the lab is done.   See what you can do with your diet in the meantime.   Get full length soft arch support replacement inserts.  Take care.  Glad to see you.

## 2018-02-10 NOTE — Assessment & Plan Note (Signed)
Discussed with patient about diet and exercise.

## 2018-02-10 NOTE — Assessment & Plan Note (Signed)
History of mild anemia with borderline neutropenia and history of stable thrombocytopenia.  Possible diagnosis he has a history of possible MGUS.  Recheck labs today are stable.  No M spike.  Discussed with patient.  Continue to check yearly.  He will update me as needed.

## 2018-02-10 NOTE — Assessment & Plan Note (Signed)
Able to tolerate statin.  Continue work on diet and exercise.  Labs discussed with patient.  He agrees. 

## 2018-02-10 NOTE — Assessment & Plan Note (Signed)
Advance directive- wife designated if patient were incapacitated.  

## 2018-02-10 NOTE — Assessment & Plan Note (Signed)
No change in meds at this point.  Routine cautions given.  Labs discussed with patient.  He agrees.

## 2018-02-10 NOTE — Assessment & Plan Note (Signed)
He has drop metatarsal heads.  No ulceration.  Okay to try full-length arch support inserts and update me as needed.  Discussed.  He agrees.

## 2018-02-10 NOTE — Assessment & Plan Note (Signed)
Flu 2019 Shingles out of stock, discussed with patient PNA up-to-date Tetanus 2011 Colonoscopy 2014.  Discussed with patient.  See after visit summary. Prostate cancer screening and PSA options (with potential risks and benefits of testing vs not testing) were discussed along with recent recs/guidelines.  He declined testing PSA at this point. Advance directive -wife designated if patient were incapacitated Cognitive function addressed- see scanned forms- and if abnormal then additional documentation follows.

## 2018-02-10 NOTE — Assessment & Plan Note (Signed)
No change in medication at this point.  Continue work on diet and exercise.  If he works more on exercise that may bring his sugar down enough to where he does not need a medication change.  Discussed.  See above.  Recheck periodically.  See after visit summary.  He agrees.

## 2018-02-15 DIAGNOSIS — Z961 Presence of intraocular lens: Secondary | ICD-10-CM | POA: Diagnosis not present

## 2018-02-15 DIAGNOSIS — E113513 Type 2 diabetes mellitus with proliferative diabetic retinopathy with macular edema, bilateral: Secondary | ICD-10-CM | POA: Diagnosis not present

## 2018-02-15 DIAGNOSIS — H02831 Dermatochalasis of right upper eyelid: Secondary | ICD-10-CM | POA: Diagnosis not present

## 2018-02-15 DIAGNOSIS — H02834 Dermatochalasis of left upper eyelid: Secondary | ICD-10-CM | POA: Diagnosis not present

## 2018-03-29 DIAGNOSIS — E113513 Type 2 diabetes mellitus with proliferative diabetic retinopathy with macular edema, bilateral: Secondary | ICD-10-CM | POA: Diagnosis not present

## 2018-03-29 DIAGNOSIS — Z961 Presence of intraocular lens: Secondary | ICD-10-CM | POA: Diagnosis not present

## 2018-03-29 DIAGNOSIS — H02834 Dermatochalasis of left upper eyelid: Secondary | ICD-10-CM | POA: Diagnosis not present

## 2018-03-29 DIAGNOSIS — H02831 Dermatochalasis of right upper eyelid: Secondary | ICD-10-CM | POA: Diagnosis not present

## 2018-03-29 DIAGNOSIS — Z7984 Long term (current) use of oral hypoglycemic drugs: Secondary | ICD-10-CM | POA: Diagnosis not present

## 2018-05-24 DIAGNOSIS — Z961 Presence of intraocular lens: Secondary | ICD-10-CM | POA: Diagnosis not present

## 2018-05-24 DIAGNOSIS — H02834 Dermatochalasis of left upper eyelid: Secondary | ICD-10-CM | POA: Diagnosis not present

## 2018-05-24 DIAGNOSIS — Z7984 Long term (current) use of oral hypoglycemic drugs: Secondary | ICD-10-CM | POA: Diagnosis not present

## 2018-05-24 DIAGNOSIS — E113513 Type 2 diabetes mellitus with proliferative diabetic retinopathy with macular edema, bilateral: Secondary | ICD-10-CM | POA: Diagnosis not present

## 2018-05-24 DIAGNOSIS — H02831 Dermatochalasis of right upper eyelid: Secondary | ICD-10-CM | POA: Diagnosis not present

## 2018-06-15 DIAGNOSIS — D2261 Melanocytic nevi of right upper limb, including shoulder: Secondary | ICD-10-CM | POA: Diagnosis not present

## 2018-06-15 DIAGNOSIS — Z08 Encounter for follow-up examination after completed treatment for malignant neoplasm: Secondary | ICD-10-CM | POA: Diagnosis not present

## 2018-06-15 DIAGNOSIS — D225 Melanocytic nevi of trunk: Secondary | ICD-10-CM | POA: Diagnosis not present

## 2018-06-15 DIAGNOSIS — D2262 Melanocytic nevi of left upper limb, including shoulder: Secondary | ICD-10-CM | POA: Diagnosis not present

## 2018-06-15 DIAGNOSIS — D485 Neoplasm of uncertain behavior of skin: Secondary | ICD-10-CM | POA: Diagnosis not present

## 2018-06-15 DIAGNOSIS — L570 Actinic keratosis: Secondary | ICD-10-CM | POA: Diagnosis not present

## 2018-06-15 DIAGNOSIS — D2271 Melanocytic nevi of right lower limb, including hip: Secondary | ICD-10-CM | POA: Diagnosis not present

## 2018-06-15 DIAGNOSIS — Z85828 Personal history of other malignant neoplasm of skin: Secondary | ICD-10-CM | POA: Diagnosis not present

## 2018-06-15 DIAGNOSIS — D044 Carcinoma in situ of skin of scalp and neck: Secondary | ICD-10-CM | POA: Diagnosis not present

## 2018-06-15 DIAGNOSIS — D2272 Melanocytic nevi of left lower limb, including hip: Secondary | ICD-10-CM | POA: Diagnosis not present

## 2018-06-23 ENCOUNTER — Encounter: Payer: Self-pay | Admitting: Internal Medicine

## 2018-08-16 DIAGNOSIS — H02831 Dermatochalasis of right upper eyelid: Secondary | ICD-10-CM | POA: Diagnosis not present

## 2018-08-16 DIAGNOSIS — Z961 Presence of intraocular lens: Secondary | ICD-10-CM | POA: Diagnosis not present

## 2018-08-16 DIAGNOSIS — Z7984 Long term (current) use of oral hypoglycemic drugs: Secondary | ICD-10-CM | POA: Diagnosis not present

## 2018-08-16 DIAGNOSIS — H02834 Dermatochalasis of left upper eyelid: Secondary | ICD-10-CM | POA: Diagnosis not present

## 2018-08-16 DIAGNOSIS — E113513 Type 2 diabetes mellitus with proliferative diabetic retinopathy with macular edema, bilateral: Secondary | ICD-10-CM | POA: Diagnosis not present

## 2018-10-28 DIAGNOSIS — D044 Carcinoma in situ of skin of scalp and neck: Secondary | ICD-10-CM | POA: Diagnosis not present

## 2018-11-08 DIAGNOSIS — H02831 Dermatochalasis of right upper eyelid: Secondary | ICD-10-CM | POA: Diagnosis not present

## 2018-11-08 DIAGNOSIS — E113513 Type 2 diabetes mellitus with proliferative diabetic retinopathy with macular edema, bilateral: Secondary | ICD-10-CM | POA: Diagnosis not present

## 2018-11-08 DIAGNOSIS — H02834 Dermatochalasis of left upper eyelid: Secondary | ICD-10-CM | POA: Diagnosis not present

## 2018-11-08 DIAGNOSIS — Z961 Presence of intraocular lens: Secondary | ICD-10-CM | POA: Diagnosis not present

## 2018-11-08 DIAGNOSIS — Z7984 Long term (current) use of oral hypoglycemic drugs: Secondary | ICD-10-CM | POA: Diagnosis not present

## 2019-01-19 ENCOUNTER — Other Ambulatory Visit: Payer: Self-pay

## 2019-01-19 DIAGNOSIS — Z20822 Contact with and (suspected) exposure to covid-19: Secondary | ICD-10-CM

## 2019-01-19 DIAGNOSIS — R6889 Other general symptoms and signs: Secondary | ICD-10-CM | POA: Diagnosis not present

## 2019-01-20 LAB — NOVEL CORONAVIRUS, NAA: SARS-CoV-2, NAA: NOT DETECTED

## 2019-01-23 ENCOUNTER — Other Ambulatory Visit: Payer: Self-pay | Admitting: Family Medicine

## 2019-01-23 DIAGNOSIS — E119 Type 2 diabetes mellitus without complications: Secondary | ICD-10-CM

## 2019-01-23 DIAGNOSIS — D61818 Other pancytopenia: Secondary | ICD-10-CM

## 2019-01-23 DIAGNOSIS — D472 Monoclonal gammopathy: Secondary | ICD-10-CM

## 2019-02-01 DIAGNOSIS — Z85828 Personal history of other malignant neoplasm of skin: Secondary | ICD-10-CM | POA: Diagnosis not present

## 2019-02-01 DIAGNOSIS — D2271 Melanocytic nevi of right lower limb, including hip: Secondary | ICD-10-CM | POA: Diagnosis not present

## 2019-02-01 DIAGNOSIS — X32XXXA Exposure to sunlight, initial encounter: Secondary | ICD-10-CM | POA: Diagnosis not present

## 2019-02-01 DIAGNOSIS — L923 Foreign body granuloma of the skin and subcutaneous tissue: Secondary | ICD-10-CM | POA: Diagnosis not present

## 2019-02-01 DIAGNOSIS — Z08 Encounter for follow-up examination after completed treatment for malignant neoplasm: Secondary | ICD-10-CM | POA: Diagnosis not present

## 2019-02-01 DIAGNOSIS — D2272 Melanocytic nevi of left lower limb, including hip: Secondary | ICD-10-CM | POA: Diagnosis not present

## 2019-02-01 DIAGNOSIS — D225 Melanocytic nevi of trunk: Secondary | ICD-10-CM | POA: Diagnosis not present

## 2019-02-01 DIAGNOSIS — L570 Actinic keratosis: Secondary | ICD-10-CM | POA: Diagnosis not present

## 2019-02-21 ENCOUNTER — Ambulatory Visit (INDEPENDENT_AMBULATORY_CARE_PROVIDER_SITE_OTHER): Payer: Medicare HMO

## 2019-02-21 DIAGNOSIS — Z9841 Cataract extraction status, right eye: Secondary | ICD-10-CM | POA: Diagnosis not present

## 2019-02-21 DIAGNOSIS — Z7984 Long term (current) use of oral hypoglycemic drugs: Secondary | ICD-10-CM | POA: Diagnosis not present

## 2019-02-21 DIAGNOSIS — H02831 Dermatochalasis of right upper eyelid: Secondary | ICD-10-CM | POA: Diagnosis not present

## 2019-02-21 DIAGNOSIS — Z961 Presence of intraocular lens: Secondary | ICD-10-CM | POA: Diagnosis not present

## 2019-02-21 DIAGNOSIS — Z0000 Encounter for general adult medical examination without abnormal findings: Secondary | ICD-10-CM | POA: Diagnosis not present

## 2019-02-21 DIAGNOSIS — H02834 Dermatochalasis of left upper eyelid: Secondary | ICD-10-CM | POA: Diagnosis not present

## 2019-02-21 DIAGNOSIS — Z9842 Cataract extraction status, left eye: Secondary | ICD-10-CM | POA: Diagnosis not present

## 2019-02-21 DIAGNOSIS — E113513 Type 2 diabetes mellitus with proliferative diabetic retinopathy with macular edema, bilateral: Secondary | ICD-10-CM | POA: Diagnosis not present

## 2019-02-21 NOTE — Patient Instructions (Signed)
[name] , Thank you for taking time to come for your Medicare Wellness Visit. I appreciate your ongoing commitment to your health goals. Please review the following plan we discussed and let me know if I can assist you in the future.   Screening recommendations/referrals: Colonoscopy: declined due to COVID, will reschedule in the next few months  Recommended yearly ophthalmology/optometry visit for glaucoma screening and checkup Recommended yearly dental visit for hygiene and checkup  Vaccinations: Influenza vaccine: will get at upcoming physical Pneumococcal vaccine: Completed series Tdap vaccine: up to date, completed 10/16/2009 Shingles vaccine: will discuss with provider    Advanced directives: Please bring a copy of your POA (Power of [place]) and/or Living Will to your next appointment.   Conditions/risks identified: diabetes, hypertension, hypercholesterolemia  Next appointment: 02/28/2019 @ 12 pm  Preventive Care 18 Years and Older, Male Preventive care refers to lifestyle choices and visits with your health care provider that can promote health and wellness. What does preventive care include?  A yearly physical exam. This is also called an annual well check.  Dental exams once or twice a year.  Routine eye exams. Ask your health care provider how often you should have your eyes checked.  Personal lifestyle choices, including:  Daily care of your teeth and gums.  Regular physical activity.  Eating a healthy diet.  Avoiding tobacco and drug use.  Limiting alcohol use.  Practicing safe sex.  Taking low doses of aspirin every day.  Taking vitamin and mineral supplements as recommended by your health care provider. What happens during an annual well check? The services and screenings done by your health care provider during your annual well check will depend on your age, overall health, lifestyle risk factors, and family history of disease. Counseling  Your  health care provider may ask you questions about your:  Alcohol use.  Tobacco use.  Drug use.  Emotional well-being.  Home and relationship well-being.  Sexual activity.  Eating habits.  History of falls.  Memory and ability to understand (cognition).  Work and work Statistician. Screening  You may have the following tests or measurements:  Height, weight, and BMI.  Blood pressure.  Lipid and cholesterol levels. These may be checked every 5 years, or more frequently if you are over 36 years old.  Skin check.  Lung cancer screening. You may have this screening every year starting at age 43 if you have a 30-pack-year history of smoking and currently smoke or have quit within the past 15 years.  Fecal occult blood test (FOBT) of the stool. You may have this test every year starting at age 80.  Flexible sigmoidoscopy or colonoscopy. You may have a sigmoidoscopy every 5 years or a colonoscopy every 10 years starting at age 70.  Prostate cancer screening. Recommendations will vary depending on your family history and other risks.  Hepatitis C blood test.  Hepatitis B blood test.  Sexually transmitted disease (STD) testing.  Diabetes screening. This is done by checking your blood sugar (glucose) after you have not eaten for a while (fasting). You may have this done every 1-3 years.  Abdominal aortic aneurysm (AAA) screening. You may need this if you are a current or former smoker.  Osteoporosis. You may be screened starting at age 26 if you are at high risk. Talk with your health care provider about your test results, treatment options, and if necessary, the need for more tests. Vaccines  Your health care provider may recommend certain vaccines, such  as:  Influenza vaccine. This is recommended every year.  Tetanus, diphtheria, and acellular pertussis (Tdap, Td) vaccine. You may need a Td booster every 10 years.  Zoster vaccine. You may need this after age 59.   Pneumococcal 13-valent conjugate (PCV13) vaccine. One dose is recommended after age 25.  Pneumococcal polysaccharide (PPSV23) vaccine. One dose is recommended after age 90. Talk to your health care provider about which screenings and vaccines you need and how often you need them. This information is not intended to replace advice given to you by your health care provider. Make sure you discuss any questions you have with your health care provider. Document Released: 05/04/2015 Document Revised: 12/26/2015 Document Reviewed: 02/06/2015 Elsevier Interactive Patient Education  2017 [employer] Prevention in the Home Falls can cause injuries. They can happen to people of all ages. There are many things you can do to make your home safe and to help prevent falls. What can I do on the outside of my home?  Regularly fix the edges of walkways and driveways and fix any cracks.  Remove anything that might make you trip as you walk through a door, such as a raised step or threshold.  Trim any bushes or trees on the path to your home.  Use bright outdoor lighting.  Clear any walking paths of anything that might make someone trip, such as rocks or tools.  Regularly check to see if handrails are loose or broken. Make sure that both sides of any steps have handrails.  Any raised decks and porches should have guardrails on the edges.  Have any leaves, snow, or ice cleared regularly.  Use sand or salt on walking paths during winter.  Clean up any spills in your garage right away. This includes oil or grease spills. What can I do in the bathroom?  Use night lights.  Install grab bars by the toilet and in the tub and shower. Do not use towel bars as grab bars.  Use non-skid mats or decals in the tub or shower.  If you need to sit down in the shower, use a plastic, non-slip stool.  Keep the floor dry. Clean up any water that spills on the floor as soon as it happens.  Remove soap  buildup in the tub or shower regularly.  Attach bath mats securely with double-sided non-slip rug tape.  Do not have throw rugs and other things on the floor that can make you trip. What can I do in the bedroom?  Use night lights.  Make sure that you have a light by your bed that is easy to reach.  Do not use any sheets or blankets that are too big for your bed. They should not hang down onto the floor.  Have a firm chair that has side arms. You can use this for support while you get dressed.  Do not have throw rugs and other things on the floor that can make you trip. What can I do in the kitchen?  Clean up any spills right away.  Avoid walking on wet floors.  Keep items that you use a lot in easy-to-reach places.  If you need to reach something above you, use a strong step stool that has a grab bar.  Keep electrical cords out of the way.  Do not use floor polish or wax that makes floors slippery. If you must use wax, use non-skid floor wax.  Do not have throw rugs and other things on the  floor that can make you trip. What can I do with my stairs?  Do not leave any items on the stairs.  Make sure that there are handrails on both sides of the stairs and use them. Fix handrails that are broken or loose. Make sure that handrails are as long as the stairways.  Check any carpeting to make sure that it is firmly attached to the stairs. Fix any carpet that is loose or worn.  Avoid having throw rugs at the top or bottom of the stairs. If you do have throw rugs, attach them to the floor with carpet tape.  Make sure that you have a light switch at the top of the stairs and the bottom of the stairs. If you do not have them, ask someone to add them for you. What else can I do to help prevent falls?  Wear shoes that:  Do not have high heels.  Have rubber bottoms.  Are comfortable and fit you well.  Are closed at the toe. Do not wear sandals.  If you use a stepladder:  Make  sure that it is fully opened. Do not climb a closed stepladder.  Make sure that both sides of the stepladder are locked into place.  Ask someone to hold it for you, if possible.  Clearly mark and make sure that you can see:  Any grab bars or handrails.  First and last steps.  Where the edge of each step is.  Use tools that help you move around (mobility aids) if they are needed. These include:  Canes.  Walkers.  Scooters.  Crutches.  Turn on the lights when you go into a dark area. Replace any light bulbs as soon as they burn out.  Set up your furniture so you have a clear path. Avoid moving your furniture around.  If any of your floors are uneven, fix them.  If there are any pets around you, be aware of where they are.  Review your medicines with your doctor. Some medicines can make you feel dizzy. This can increase your chance of falling. Ask your doctor what other things that you can do to help prevent falls. This information is not intended to replace advice given to you by your health care provider. Make sure you discuss any questions you have with your health care provider. Document Released: 02/01/2009 Document Revised: 09/13/2015 Document Reviewed: 05/12/2014 Elsevier Interactive Patient Education  2017 [employer].

## 2019-02-21 NOTE — Progress Notes (Signed)
Subjective:   Dennis Wise is a 68 y.o. male who presents for Medicare Annual/Subsequent preventive examination.  Review of Systems:    This visit is being conducted through telemedicine via telephone at the nurse health advisor's home address due to the COVID-19 pandemic. This patient has given me verbal consent via doximity to conduct this visit, patient states they are participating from their home address. Patient and myself are on the telephone call. There is no referral for this visit. Some vital signs may be absent or patient reported.    Patient identification: identified by name, DOB, and current address   Cardiac Risk Factors include: advanced age (>74mn, >>18women);diabetes mellitus;hypertension;male gender;Other (see comment), Risk factor comments: hypercholesterolemia     Objective:    Vitals: There were no vitals taken for this visit.  There is no height or weight on file to calculate BMI.  Advanced Directives 02/21/2019 01/30/2017 02/04/2016 10/22/2015 10/22/2015 01/29/2015  Does Patient Have a Medical Advance Directive? Yes Yes Yes Yes Yes Yes  Type of AParamedicof [place]ape ColonyLiving will [place]Living will - [place]Living will [place]onca CityLiving will [place]Living will  Does patient want to make changes to medical advance directive? - - - No - Patient declined No - Patient declined -  Copy of [place] Chart? No - copy requested No - copy requested - - - No - copy requested    Tobacco Social History   Tobacco Use  Smoking Status Never Smoker  Smokeless Tobacco Never Used     Counseling given: Not Answered   Clinical Intake:  Pre-visit preparation completed: Yes  Pain : No/denies pain     Nutritional Risks: None Diabetes: Yes CBG done?: No Did pt. bring in CBG monitor from home?: No  How often do you need to have someone help you  when you read instructions, pamphlets, or other written materials from your doctor or pharmacy?: 1 - Never What is the last grade level you completed in school?: 12th  Interpreter Needed?: No  Information entered by :: CJohnson, LPN  Past Medical History:  Diagnosis Date  . Anemia    Iron deficiency part of this  . Cataract   . Diabetes mellitus    type II  . GERD (gastroesophageal reflux disease) 10/90  . Helicobacter pylori gastritis 06/11/2011   On EGD 05/2011   . Hyperlipidemia   . Hypertension 08/01  . Internal hemorrhoids   . Iron deficiency anemia, unspecified 03/04/2011  . MGUS (monoclonal gammopathy of unknown significance)    possible dx initially, had negative f/u  . Pancytopenia   . Personal history of colonic adenomas 06/05/2012  . Pneumonia    Past Surgical History:  Procedure Laterality Date  . CATARACT EXTRACTION Bilateral   . FLEXIBLE SIGMOIDOSCOPY  05/1988   internal hemorrhoids  . KNEE SURGERY  09/04   lt knee fx repair ORIF  . TIBIA FRACTURE SURGERY     left 2012   Family History  Problem Relation Age of Onset  . Skin cancer Brother   . Diabetes Brother   . Emphysema Father   . Cancer Father        ?  .Marland KitchenAlzheimer's disease Father   . Cancer Mother        ?  . Diabetes Mother   . Heart failure Mother   . Colon cancer Neg Hx   . Prostate cancer Neg Hx   .  Esophageal cancer Neg Hx   . Rectal cancer Neg Hx   . Stomach cancer Neg Hx    Social History   Socioeconomic History  . Marital status: Married    Spouse name: Not on file  . Number of children: Not on file  . Years of education: Not on file  . Highest education level: Not on file  Occupational History  . Not on file  Social Needs  . Financial resource strain: Not hard at all  . Food insecurity    Worry: Never true    Inability: Never true  . Transportation needs    Medical: No    Non-medical: No  Tobacco Use  . Smoking status: Never Smoker  . Smokeless tobacco: Never Used   Substance and Sexual Activity  . Alcohol use: Never    Frequency: Never  . Drug use: No  . Sexual activity: Yes  Lifestyle  . Physical activity    Days per week: 0 days    Minutes per session: 0 min  . Stress: Not at all  Relationships  . Social Herbalist on phone: Not on file    Gets together: Not on file    Attends religious service: Not on file    Active member of club or organization: Not on file    Attends meetings of clubs or organizations: Not on file    Relationship status: Not on file  Other Topics Concern  . Not on file  Social History Narrative   Prev traveling for sports broadcasting    Working for [employer], [employer], [employer] etc as of 2019   Married 1970   1 child   [employer]70-'[employer], domestic    Outpatient Encounter Medications as of 02/21/2019  Medication Sig  . ACCU-CHEK FASTCLIX LANCETS MISC Use to test blood sugar once daily or as needed.  Diagnosis:  E11.3299  Non insulin dependent.  Marland Kitchen aspirin 81 MG tablet Take 81 mg by mouth daily.    Marland Kitchen atorvastatin (LIPITOR) 10 MG tablet Take 1 tablet (10 mg total) by mouth at bedtime.  . Blood Glucose Monitoring Suppl (ACCU-CHEK NANO SMARTVIEW) w/Device KIT Use to check blood sugar once daily or as needed.  Diagnosis: E11.3299  Non insulin dependent.  Marland Kitchen glimepiride (AMARYL) 2 MG tablet TAKE 1 TABLET EVERY DAY BEFORE BREAKFAST  . glucose blood (ACCU-CHEK SMARTVIEW) test strip Use as instructed to test blood sugar once daily or as needed.  Diagnosis:  E11.3299  Non insulin dependent.  . metFORMIN (GLUCOPHAGE) 850 MG tablet Take 1 tablet (850 mg total) by mouth 2 (two) times daily.  . Multiple Vitamin (MULTIVITAMIN) tablet Take 1 tablet by mouth daily.    . niacin (NIASPAN) 500 MG CR tablet TAKE 3 TABLETS AT BEDTIME  . pioglitazone (ACTOS) 45 MG tablet Take 1 tablet (45 mg total) by mouth daily.  . ramipril (ALTACE) 5 MG capsule Take 1 capsule (5 mg total) by mouth daily.   No facility-administered encounter medications on file as  of 02/21/2019.     Activities of Daily Living In your present state of health, do you have any difficulty performing the following activities: 02/21/2019  Hearing? Y  Comment hearing loss is worsening  Vision? N  Difficulty concentrating or making decisions? N  Walking or climbing stairs? N  Dressing or bathing? N  Doing errands, shopping? N  Preparing Food and eating ? N  Using the Toilet? N  In the past six months, have you accidently leaked  urine? N  Do you have problems with loss of bowel control? N  Managing your Medications? N  Managing your Finances? N  Housekeeping or managing your Housekeeping? N  Some recent data might be hidden    Patient Care Team: [name], MD as PCP - General (Family Medicine) Gearlean Alf, MD as Referring Physician (Ophthalmology) Nicholas Lose, MD as Consulting Physician (Hematology and Oncology)   Assessment:   This is a routine wellness examination for [facility].  Exercise Activities and Dietary recommendations Current Exercise Habits: The patient has a physically strenuous job, but has no regular exercise apart from work., Exercise limited by: None identified  Goals    . 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     . Patient Stated     02/21/2019, I will continue to eat a healthier diet and exercise more daily.        Fall Risk Fall Risk  02/21/2019 02/09/2018 01/30/2017 10/22/2015 10/22/2015  Falls in the past year? 0 No No No No  Number falls in past yr: 0 - - - -  Injury with Fall? 0 - - - -  Risk for fall due to : Medication side effect - - - -  Follow up Falls evaluation completed;Falls prevention discussed - - - -   Is the patient's home free of loose throw rugs in walkways, pet beds, electrical cords, etc?   yes      Grab bars in the bathroom? yes      Handrails on the stairs?   yes      Adequate lighting?   yes  Timed  Get Up and Go Performed: N/A  Depression Screen PHQ 2/9 Scores 02/21/2019 02/09/2018 01/30/2017 10/22/2015  PHQ - 2 Score 0 0 0 0  PHQ- 9 Score 0 - 0 -    Cognitive Function MMSE - Mini Mental State Exam 02/21/2019 01/30/2017 10/22/2015  Orientation to time '5 5 5  ' Orientation to Place '5 5 5  ' Registration '3 3 3  ' Attention/ Calculation 5 0 0  Recall '3 3 3  ' Language- name 2 objects - 0 0  Language- repeat '1 1 1  ' Language- follow 3 step command - 3 3  Language- read & follow direction - 0 0  Write a sentence - 0 0  Copy design - 0 0  Total score - 20 20  Mini Cog  Mini-Cog screen was completed. Maximum score is 22. A value of 0 denotes this part of the MMSE was not completed or the patient failed this part of the Mini-Cog screening.       Immunization History  Administered Date(s) Administered  . Influenza Split 03/03/2011, 02/03/2012  . Influenza,inj,Quad PF,6+ Mos 02/28/2013, 02/20/2015, 05/12/2016, 01/30/2017, 02/09/2018  . Pneumococcal Conjugate-13 10/14/2013  . Pneumococcal Polysaccharide-23 08/22/1998, 03/03/2011, 01/30/2017  . Td 08/22/1998, 10/16/2009  . Zoster 02/28/2013    Qualifies for Shingles Vaccine? Yes  Screening Tests Health Maintenance  Topic Date Due  . HEMOGLOBIN A1C  08/04/2018  . INFLUENZA VACCINE  11/20/2018  . FOOT EXAM  02/10/2019  . COLONOSCOPY  02/21/2020 (Originally 04/07/2018)  . TETANUS/TDAP  10/17/2019  . OPHTHALMOLOGY EXAM  02/21/2020  . Hepatitis C Screening  Completed  . PNA vac Low Risk Adult  Completed   Cancer Screenings: Lung: Low Dose CT Chest recommended if Age 99-80 years, 30 pack-year currently smoking  OR have quit w/in 15years. Patient does not qualify. Colorectal: declined due to COVID, will be rescheduled in the next few months  Additional Screenings:  Hepatitis C Screening: 10/16/2015      Plan:    Patient will continue to eat a healthier diet and exercise more daily.   I have personally reviewed and noted the  following in the patient's chart:   . Medical and social history . Use of alcohol, tobacco or illicit drugs  . Current medications and supplements . Functional ability and status . Nutritional status . Physical activity . Advanced directives . List of other physicians . Hospitalizations, surgeries, and ER visits in previous 12 months . Vitals . Screenings to include cognitive, depression, and falls . Referrals and appointments  In addition, I have reviewed and discussed with patient certain preventive protocols, quality metrics, and best practice recommendations. A written personalized care plan for preventive services as well as general preventive health recommendations were provided to patient.     Andrez Grime, LPN  [id number]

## 2019-02-21 NOTE — Progress Notes (Signed)
PCP notes:  Health Maintenance:   Declined colonoscopy due to COVID, will reschedule in next few months  Needs flu vaccine  Wants to discuss Shingrix with provider     Abnormal Screenings: none    Patient concerns: none    Nurse concerns: none    Next PCP appt.: 02/28/2019 @ 12 pm

## 2019-02-22 ENCOUNTER — Other Ambulatory Visit (INDEPENDENT_AMBULATORY_CARE_PROVIDER_SITE_OTHER): Payer: Medicare HMO

## 2019-02-22 ENCOUNTER — Other Ambulatory Visit: Payer: Self-pay

## 2019-02-22 DIAGNOSIS — D61818 Other pancytopenia: Secondary | ICD-10-CM | POA: Diagnosis not present

## 2019-02-22 DIAGNOSIS — D472 Monoclonal gammopathy: Secondary | ICD-10-CM | POA: Diagnosis not present

## 2019-02-22 DIAGNOSIS — E119 Type 2 diabetes mellitus without complications: Secondary | ICD-10-CM

## 2019-02-22 LAB — CBC WITH DIFFERENTIAL/PLATELET
Basophils Absolute: 0 10*3/uL (ref 0.0–0.1)
Basophils Relative: 0.5 % (ref 0.0–3.0)
Eosinophils Absolute: 0.1 10*3/uL (ref 0.0–0.7)
Eosinophils Relative: 1.7 % (ref 0.0–5.0)
HCT: 34.5 % — ABNORMAL LOW (ref 39.0–52.0)
Hemoglobin: 11.6 g/dL — ABNORMAL LOW (ref 13.0–17.0)
Lymphocytes Relative: 31.6 % (ref 12.0–46.0)
Lymphs Abs: 1.1 10*3/uL (ref 0.7–4.0)
MCHC: 33.7 g/dL (ref 30.0–36.0)
MCV: 89.4 fl (ref 78.0–100.0)
Monocytes Absolute: 0.4 10*3/uL (ref 0.1–1.0)
Monocytes Relative: 10.1 % (ref 3.0–12.0)
Neutro Abs: 2 10*3/uL (ref 1.4–7.7)
Neutrophils Relative %: 56.1 % (ref 43.0–77.0)
Platelets: 106 10*3/uL — ABNORMAL LOW (ref 150.0–400.0)
RBC: 3.86 Mil/uL — ABNORMAL LOW (ref 4.22–5.81)
RDW: 13.5 % (ref 11.5–15.5)
WBC: 3.6 10*3/uL — ABNORMAL LOW (ref 4.0–10.5)

## 2019-02-22 LAB — LIPID PANEL
Cholesterol: 151 mg/dL (ref 0–200)
HDL: 75.7 mg/dL (ref >39.00)
LDL Cholesterol: 61 mg/dL (ref 0–99)
NonHDL: 75.14
Total CHOL/HDL Ratio: 2
Triglycerides: 72 mg/dL (ref 0.0–149.0)
VLDL: 14.4 mg/dL (ref 0.0–40.0)

## 2019-02-22 LAB — COMPREHENSIVE METABOLIC PANEL
ALT: 17 U/L (ref 0–53)
AST: 15 U/L (ref 0–37)
Albumin: 4.1 g/dL (ref 3.5–5.2)
Alkaline Phosphatase: 106 U/L (ref 39–117)
BUN: 21 mg/dL (ref 6–23)
CO2: 30 mEq/L (ref 19–32)
Calcium: 9 mg/dL (ref 8.4–10.5)
Chloride: 102 mEq/L (ref 96–112)
Creatinine, Ser: 1.11 mg/dL (ref 0.40–1.50)
GFR: 65.37 mL/min (ref >60.00)
Glucose, Bld: 182 mg/dL — ABNORMAL HIGH (ref 70–99)
Potassium: 4.3 mEq/L (ref 3.5–5.1)
Sodium: 139 mEq/L (ref 135–145)
Total Bilirubin: 0.7 mg/dL (ref 0.2–1.2)
Total Protein: 6.4 g/dL (ref 6.0–8.3)

## 2019-02-23 LAB — HEMOGLOBIN A1C: Hgb A1c MFr Bld: 7.4 % — ABNORMAL HIGH (ref 4.6–6.5)

## 2019-02-24 LAB — PROTEIN ELECTROPHORESIS, SERUM
Albumin ELP: 3.8 g/dL (ref 3.8–4.8)
Alpha 1: 0.3 g/dL (ref 0.2–0.3)
Alpha 2: 0.6 g/dL (ref 0.5–0.9)
Beta 2: 0.3 g/dL (ref 0.2–0.5)
Beta Globulin: 0.5 g/dL (ref 0.4–0.6)
Gamma Globulin: 0.8 g/dL (ref 0.8–1.7)
Total Protein: 6.4 g/dL (ref 6.1–8.1)

## 2019-02-28 ENCOUNTER — Ambulatory Visit (INDEPENDENT_AMBULATORY_CARE_PROVIDER_SITE_OTHER): Payer: Medicare HMO | Admitting: Family Medicine

## 2019-02-28 ENCOUNTER — Encounter: Payer: Self-pay | Admitting: Family Medicine

## 2019-02-28 ENCOUNTER — Other Ambulatory Visit: Payer: Self-pay

## 2019-02-28 VITALS — BP 150/60 | HR 67 | Temp 98.0°F | Ht 72.5 in | Wt 281.3 lb

## 2019-02-28 DIAGNOSIS — I10 Essential (primary) hypertension: Secondary | ICD-10-CM | POA: Diagnosis not present

## 2019-02-28 DIAGNOSIS — E7800 Pure hypercholesterolemia, unspecified: Secondary | ICD-10-CM

## 2019-02-28 DIAGNOSIS — E113299 Type 2 diabetes mellitus with mild nonproliferative diabetic retinopathy without macular edema, unspecified eye: Secondary | ICD-10-CM

## 2019-02-28 DIAGNOSIS — D61818 Other pancytopenia: Secondary | ICD-10-CM | POA: Diagnosis not present

## 2019-02-28 DIAGNOSIS — Z23 Encounter for immunization: Secondary | ICD-10-CM | POA: Diagnosis not present

## 2019-02-28 DIAGNOSIS — Z0000 Encounter for general adult medical examination without abnormal findings: Secondary | ICD-10-CM

## 2019-02-28 DIAGNOSIS — Z7189 Other specified counseling: Secondary | ICD-10-CM

## 2019-02-28 DIAGNOSIS — M720 Palmar fascial fibromatosis [Dupuytren]: Secondary | ICD-10-CM

## 2019-02-28 MED ORDER — ATORVASTATIN CALCIUM 10 MG PO TABS: 10.0000 mg | ORAL_TABLET | Freq: Every day | ORAL | 3 refills | Status: DC

## 2019-02-28 MED ORDER — PIOGLITAZONE HCL 45 MG PO TABS: 45.0000 mg | ORAL_TABLET | Freq: Every day | ORAL | 3 refills | Status: DC

## 2019-02-28 MED ORDER — METFORMIN HCL 850 MG PO TABS: 850.0000 mg | ORAL_TABLET | Freq: Two times a day (BID) | ORAL | 3 refills | Status: DC

## 2019-02-28 MED ORDER — RAMIPRIL 5 MG PO CAPS: 5.0000 mg | ORAL_CAPSULE | Freq: Every day | ORAL | 3 refills | Status: DC

## 2019-02-28 MED ORDER — GLIMEPIRIDE 2 MG PO TABS: ORAL_TABLET | ORAL | 3 refills | Status: DC

## 2019-02-28 NOTE — Patient Instructions (Addendum)
Check with your insurance to see if they will cover the shingrix shot. Recheck in about 6 months with A1c at the visit. Take care.  Glad to see you.  Update me as needed.

## 2019-02-28 NOTE — Progress Notes (Signed)
He is still working some.  D/w pt.   Declined colonoscopy due to COVID, will reschedule in next few months.  D/w pt.   Prostate cancer screening and PSA options (with potential risks and benefits of testing vs not testing) were discussed along with recent recs/guidelines.  He declined testing PSA at this point. Flu 2020 Shingrix d/w pt.  See AVS.  Advance directive -wife designated if patient were incapacitated  Diabetes:  Using medications without difficulties: yes Hypoglycemic episodes:no Hyperglycemic episodes:no Feet problems: no Blood Sugars averaging: not checked often eye exam within last year: yes, no recent need for injections.    Elevated Cholesterol: Using medications without problems: yes Muscle aches: no Diet compliance: encouraged.  Exercise: encouraged  Hypertension:    Using medication without problems or lightheadedness: yes Chest pain with exertion:no Edema:no Short of breath:no  Possible MGUS.  Normal SPEP.  Low PLTs/WBC at baseline.  HGB similar to prev.    Dupuytren contracture discussed with patient.  Not bothersome enough now to get treatment.  If he is more bothered by this he can update me and we can refer him to the hand clinic.  Discussed.  PMH and SH reviewed Meds, vitals, and allergies reviewed.   ROS: Per HPI unless specifically indicated in ROS section   GEN: nad, alert and oriented HEENT: ncat NECK: supple w/o LA CV: rrr. PULM: ctab, no inc wob ABD: soft, +bs EXT: no edema SKIN: no acute rash Right hand with mild Dupuytren contracture along the fourth ray.  Diabetic foot exam: Normal inspection No skin breakdown No calluses  Normal DP pulses Normal sensation to light touch and monofilament except for slightly dec monofilament sensation on the L foot.   Nails normal

## 2019-03-03 DIAGNOSIS — M720 Palmar fascial fibromatosis [Dupuytren]: Secondary | ICD-10-CM | POA: Insufficient documentation

## 2019-03-03 HISTORY — DX: Palmar fascial fibromatosis (dupuytren): M72.0

## 2019-03-03 NOTE — Assessment & Plan Note (Signed)
No change in meds at this point.  Labs discussed with patient.  Continue work on diet and exercise.  He agrees.

## 2019-03-03 NOTE — Assessment & Plan Note (Signed)
Possible MGUS.  Normal SPEP.  Low PLTs/WBC at baseline.  HGB similar to prev.   We can recheck yearly.  No intervention needed at this point.  He agrees.

## 2019-03-03 NOTE — Assessment & Plan Note (Signed)
Advance directive- wife designated if patient were incapacitated.  

## 2019-03-03 NOTE — Assessment & Plan Note (Signed)
Declined colonoscopy due to COVID, will reschedule in next few months.  D/w pt.   Prostate cancer screening and PSA options (with potential risks and benefits of testing vs not testing) were discussed along with recent recs/guidelines.  He declined testing PSA at this point. Flu 2020 Shingrix d/w pt.  See AVS.  Advance directive -wife designated if patient were incapacitated

## 2019-03-03 NOTE — Assessment & Plan Note (Signed)
Dupuytren contracture discussed with patient.  Not bothersome enough now to get treatment.  If he is more bothered by this he can update me and we can refer him to the hand clinic.  Discussed.

## 2019-03-03 NOTE — Assessment & Plan Note (Addendum)
No change in meds at this point.  He will continue work on diet and exercise and recheck in about 6 months.  Labs discussed with patient.  He agrees. >25 minutes spent in face to face time with patient, >50% spent in counselling or coordination of care

## 2019-03-31 DIAGNOSIS — H5213 Myopia, bilateral: Secondary | ICD-10-CM | POA: Diagnosis not present

## 2019-05-16 DIAGNOSIS — Z9842 Cataract extraction status, left eye: Secondary | ICD-10-CM | POA: Diagnosis not present

## 2019-05-16 DIAGNOSIS — H02834 Dermatochalasis of left upper eyelid: Secondary | ICD-10-CM | POA: Diagnosis not present

## 2019-05-16 DIAGNOSIS — E113513 Type 2 diabetes mellitus with proliferative diabetic retinopathy with macular edema, bilateral: Secondary | ICD-10-CM | POA: Diagnosis not present

## 2019-05-16 DIAGNOSIS — Z7984 Long term (current) use of oral hypoglycemic drugs: Secondary | ICD-10-CM | POA: Diagnosis not present

## 2019-05-16 DIAGNOSIS — Z9841 Cataract extraction status, right eye: Secondary | ICD-10-CM | POA: Diagnosis not present

## 2019-05-16 DIAGNOSIS — Z961 Presence of intraocular lens: Secondary | ICD-10-CM | POA: Diagnosis not present

## 2019-05-16 DIAGNOSIS — H02831 Dermatochalasis of right upper eyelid: Secondary | ICD-10-CM | POA: Diagnosis not present

## 2019-07-04 DIAGNOSIS — Z20828 Contact with and (suspected) exposure to other viral communicable diseases: Secondary | ICD-10-CM | POA: Diagnosis not present

## 2019-07-04 DIAGNOSIS — Z03818 Encounter for observation for suspected exposure to other biological agents ruled out: Secondary | ICD-10-CM | POA: Diagnosis not present

## 2019-07-12 DIAGNOSIS — Z20828 Contact with and (suspected) exposure to other viral communicable diseases: Secondary | ICD-10-CM | POA: Diagnosis not present

## 2019-07-12 DIAGNOSIS — Z03818 Encounter for observation for suspected exposure to other biological agents ruled out: Secondary | ICD-10-CM | POA: Diagnosis not present

## 2019-08-08 DIAGNOSIS — Z9841 Cataract extraction status, right eye: Secondary | ICD-10-CM | POA: Diagnosis not present

## 2019-08-08 DIAGNOSIS — H02831 Dermatochalasis of right upper eyelid: Secondary | ICD-10-CM | POA: Diagnosis not present

## 2019-08-08 DIAGNOSIS — Z7984 Long term (current) use of oral hypoglycemic drugs: Secondary | ICD-10-CM | POA: Diagnosis not present

## 2019-08-08 DIAGNOSIS — E113513 Type 2 diabetes mellitus with proliferative diabetic retinopathy with macular edema, bilateral: Secondary | ICD-10-CM | POA: Diagnosis not present

## 2019-08-08 DIAGNOSIS — H02834 Dermatochalasis of left upper eyelid: Secondary | ICD-10-CM | POA: Diagnosis not present

## 2019-08-08 DIAGNOSIS — Z961 Presence of intraocular lens: Secondary | ICD-10-CM | POA: Diagnosis not present

## 2019-08-08 DIAGNOSIS — Z9842 Cataract extraction status, left eye: Secondary | ICD-10-CM | POA: Diagnosis not present

## 2019-08-29 ENCOUNTER — Other Ambulatory Visit: Payer: Self-pay

## 2019-08-29 ENCOUNTER — Encounter: Payer: Self-pay | Admitting: Family Medicine

## 2019-08-29 ENCOUNTER — Ambulatory Visit (INDEPENDENT_AMBULATORY_CARE_PROVIDER_SITE_OTHER): Payer: Medicare HMO | Admitting: Family Medicine

## 2019-08-29 VITALS — BP 142/62 | HR 59 | Temp 96.9°F | Ht 72.5 in | Wt 279.4 lb

## 2019-08-29 DIAGNOSIS — E119 Type 2 diabetes mellitus without complications: Secondary | ICD-10-CM | POA: Diagnosis not present

## 2019-08-29 DIAGNOSIS — E113299 Type 2 diabetes mellitus with mild nonproliferative diabetic retinopathy without macular edema, unspecified eye: Secondary | ICD-10-CM

## 2019-08-29 LAB — POCT GLYCOSYLATED HEMOGLOBIN (HGB A1C): Hemoglobin A1C: 8.7 % — AB (ref 4.0–5.6)

## 2019-08-29 NOTE — Patient Instructions (Signed)
Keep working on diet and exercise and let me know if your sugar isn't getting better.    If better, then recheck in 6 months at a physical/yearly visit.  If not better, recheck in 3 months/update me.  Take care.  Glad to see you.

## 2019-08-29 NOTE — Progress Notes (Signed)
This visit occurred during the SARS-CoV-2 public health emergency.  Safety protocols were in place, including screening questions prior to the visit, additional usage of staff PPE, and extensive cleaning of exam room while observing appropriate contact time as indicated for disinfecting solutions.  Diabetes:  Using medications without difficulties: yes, see below.   Hypoglycemic episodes: no Hyperglycemic episodes: no Feet problems: no Blood Sugars averaging: not checked often, d/w pt.  Usually ~150 eye exam within last year: yes A1c d/w pt.  8.7, up from prev.  He had expected some elevation with work schedule changes.    He had covid vaccine.  He had worse tinnitus after vaccination, unclear if that is related, d/w pt.  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  He is still working.    Meds, vitals, and allergies reviewed.  ROS: Per HPI unless specifically indicated in ROS section   GEN: nad, alert and oriented HEENT: ncat NECK: supple w/o LA CV: rrr. PULM: ctab, no inc wob ABD: soft, +bs EXT: no edema SKIN: well perfused.   Current Outpatient Medications on File Prior to Visit  Medication Sig Dispense Refill  . ACCU-CHEK FASTCLIX LANCETS MISC Use to test blood sugar once daily or as needed.  Diagnosis:  E11.3299  Non insulin dependent. 102 each 3  . aspirin 81 MG tablet Take 81 mg by mouth daily.      Marland Kitchen atorvastatin (LIPITOR) 10 MG tablet Take 1 tablet (10 mg total) by mouth at bedtime. 90 tablet 3  . Blood Glucose Monitoring Suppl (ACCU-CHEK NANO SMARTVIEW) w/Device KIT Use to check blood sugar once daily or as needed.  Diagnosis: E11.3299  Non insulin dependent. 1 kit 0  . glimepiride (AMARYL) 2 MG tablet TAKE 1 TABLET EVERY DAY BEFORE BREAKFAST 90 tablet 3  . glucose blood (ACCU-CHEK SMARTVIEW) test strip Use as instructed to test blood sugar once daily or as needed.  Diagnosis:  E11.3299  Non insulin dependent. 100 each 3  . metFORMIN (GLUCOPHAGE)  850 MG tablet Take 1 tablet (850 mg total) by mouth 2 (two) times daily. 180 tablet 3  . Multiple Vitamin (MULTIVITAMIN) tablet Take 1 tablet by mouth daily.      . niacin (NIASPAN) 500 MG CR tablet TAKE 3 TABLETS AT BEDTIME 270 tablet 2  . pioglitazone (ACTOS) 45 MG tablet Take 1 tablet (45 mg total) by mouth daily. 90 tablet 3  . ramipril (ALTACE) 5 MG capsule Take 1 capsule (5 mg total) by mouth daily. 90 capsule 3   No current facility-administered medications on file prior to visit.

## 2019-09-01 NOTE — Assessment & Plan Note (Signed)
Discussed options. Keep working on diet and exercise and he will let me know if his sugar isn't getting better.    If better, then recheck in 6 months at a physical/yearly visit.  If not better, recheck in 3 months/update me.  He agrees.  Continue Actos Metformin and glimepiride as is for now.

## 2019-11-04 DIAGNOSIS — Z888 Allergy status to other drugs, medicaments and biological substances status: Secondary | ICD-10-CM | POA: Diagnosis not present

## 2019-11-04 DIAGNOSIS — Z961 Presence of intraocular lens: Secondary | ICD-10-CM | POA: Diagnosis not present

## 2019-11-04 DIAGNOSIS — Z9841 Cataract extraction status, right eye: Secondary | ICD-10-CM | POA: Diagnosis not present

## 2019-11-04 DIAGNOSIS — Z7984 Long term (current) use of oral hypoglycemic drugs: Secondary | ICD-10-CM | POA: Diagnosis not present

## 2019-11-04 DIAGNOSIS — H02834 Dermatochalasis of left upper eyelid: Secondary | ICD-10-CM | POA: Diagnosis not present

## 2019-11-04 DIAGNOSIS — H02831 Dermatochalasis of right upper eyelid: Secondary | ICD-10-CM | POA: Diagnosis not present

## 2019-11-04 DIAGNOSIS — E1136 Type 2 diabetes mellitus with diabetic cataract: Secondary | ICD-10-CM | POA: Diagnosis not present

## 2019-11-04 DIAGNOSIS — E113513 Type 2 diabetes mellitus with proliferative diabetic retinopathy with macular edema, bilateral: Secondary | ICD-10-CM | POA: Diagnosis not present

## 2019-11-04 DIAGNOSIS — Z9842 Cataract extraction status, left eye: Secondary | ICD-10-CM | POA: Diagnosis not present

## 2020-01-02 ENCOUNTER — Encounter: Payer: Self-pay | Admitting: Internal Medicine

## 2020-01-12 DIAGNOSIS — L570 Actinic keratosis: Secondary | ICD-10-CM | POA: Diagnosis not present

## 2020-01-12 DIAGNOSIS — X32XXXA Exposure to sunlight, initial encounter: Secondary | ICD-10-CM | POA: Diagnosis not present

## 2020-01-12 DIAGNOSIS — D2262 Melanocytic nevi of left upper limb, including shoulder: Secondary | ICD-10-CM | POA: Diagnosis not present

## 2020-01-12 DIAGNOSIS — L821 Other seborrheic keratosis: Secondary | ICD-10-CM | POA: Diagnosis not present

## 2020-01-12 DIAGNOSIS — Z85828 Personal history of other malignant neoplasm of skin: Secondary | ICD-10-CM | POA: Diagnosis not present

## 2020-01-12 DIAGNOSIS — D2271 Melanocytic nevi of right lower limb, including hip: Secondary | ICD-10-CM | POA: Diagnosis not present

## 2020-01-12 DIAGNOSIS — D2261 Melanocytic nevi of right upper limb, including shoulder: Secondary | ICD-10-CM | POA: Diagnosis not present

## 2020-02-12 ENCOUNTER — Other Ambulatory Visit: Payer: Self-pay | Admitting: Family Medicine

## 2020-02-12 DIAGNOSIS — E119 Type 2 diabetes mellitus without complications: Secondary | ICD-10-CM

## 2020-02-12 DIAGNOSIS — D472 Monoclonal gammopathy: Secondary | ICD-10-CM

## 2020-02-15 DIAGNOSIS — Z961 Presence of intraocular lens: Secondary | ICD-10-CM | POA: Diagnosis not present

## 2020-02-15 DIAGNOSIS — E113513 Type 2 diabetes mellitus with proliferative diabetic retinopathy with macular edema, bilateral: Secondary | ICD-10-CM | POA: Diagnosis not present

## 2020-02-15 DIAGNOSIS — H02834 Dermatochalasis of left upper eyelid: Secondary | ICD-10-CM | POA: Diagnosis not present

## 2020-02-15 DIAGNOSIS — Z9842 Cataract extraction status, left eye: Secondary | ICD-10-CM | POA: Diagnosis not present

## 2020-02-15 DIAGNOSIS — Z9841 Cataract extraction status, right eye: Secondary | ICD-10-CM | POA: Diagnosis not present

## 2020-02-15 DIAGNOSIS — Z7984 Long term (current) use of oral hypoglycemic drugs: Secondary | ICD-10-CM | POA: Diagnosis not present

## 2020-02-15 DIAGNOSIS — H02831 Dermatochalasis of right upper eyelid: Secondary | ICD-10-CM | POA: Diagnosis not present

## 2020-02-21 ENCOUNTER — Other Ambulatory Visit: Payer: Self-pay

## 2020-02-21 ENCOUNTER — Ambulatory Visit (AMBULATORY_SURGERY_CENTER): Payer: Self-pay | Admitting: *Deleted

## 2020-02-21 VITALS — Ht 72.05 in | Wt 276.0 lb

## 2020-02-21 DIAGNOSIS — Z86010 Personal history of colonic polyps: Secondary | ICD-10-CM

## 2020-02-22 ENCOUNTER — Encounter: Payer: Self-pay | Admitting: Internal Medicine

## 2020-02-23 ENCOUNTER — Other Ambulatory Visit: Payer: Self-pay

## 2020-02-23 ENCOUNTER — Other Ambulatory Visit (INDEPENDENT_AMBULATORY_CARE_PROVIDER_SITE_OTHER): Payer: Medicare HMO

## 2020-02-23 DIAGNOSIS — E119 Type 2 diabetes mellitus without complications: Secondary | ICD-10-CM | POA: Diagnosis not present

## 2020-02-23 DIAGNOSIS — D472 Monoclonal gammopathy: Secondary | ICD-10-CM

## 2020-02-23 LAB — COMPREHENSIVE METABOLIC PANEL
ALT: 18 U/L (ref 0–53)
AST: 18 U/L (ref 0–37)
Albumin: 4 g/dL (ref 3.5–5.2)
Alkaline Phosphatase: 96 U/L (ref 39–117)
BUN: 29 mg/dL — ABNORMAL HIGH (ref 6–23)
CO2: 29 mEq/L (ref 19–32)
Calcium: 9 mg/dL (ref 8.4–10.5)
Chloride: 101 mEq/L (ref 96–112)
Creatinine, Ser: 1.34 mg/dL (ref 0.40–1.50)
GFR: 53.25 mL/min — ABNORMAL LOW (ref >60.00)
Glucose, Bld: 140 mg/dL — ABNORMAL HIGH (ref 70–99)
Potassium: 4.8 mEq/L (ref 3.5–5.1)
Sodium: 138 mEq/L (ref 135–145)
Total Bilirubin: 0.7 mg/dL (ref 0.2–1.2)
Total Protein: 6.1 g/dL (ref 6.0–8.3)

## 2020-02-23 LAB — HEMOGLOBIN A1C: Hgb A1c MFr Bld: 8.1 % — ABNORMAL HIGH (ref 4.6–6.5)

## 2020-02-23 LAB — CBC WITH DIFFERENTIAL/PLATELET
Basophils Absolute: 0 10*3/uL (ref 0.0–0.1)
Basophils Relative: 0.5 % (ref 0.0–3.0)
Eosinophils Absolute: 0.1 10*3/uL (ref 0.0–0.7)
Eosinophils Relative: 2.6 % (ref 0.0–5.0)
HCT: 34.5 % — ABNORMAL LOW (ref 39.0–52.0)
Hemoglobin: 12 g/dL — ABNORMAL LOW (ref 13.0–17.0)
Lymphocytes Relative: 36.6 % (ref 12.0–46.0)
Lymphs Abs: 1.4 10*3/uL (ref 0.7–4.0)
MCHC: 34.8 g/dL (ref 30.0–36.0)
MCV: 87.2 fl (ref 78.0–100.0)
Monocytes Absolute: 0.4 10*3/uL (ref 0.1–1.0)
Monocytes Relative: 9.3 % (ref 3.0–12.0)
Neutro Abs: 2 10*3/uL (ref 1.4–7.7)
Neutrophils Relative %: 51 % (ref 43.0–77.0)
Platelets: 95 10*3/uL — ABNORMAL LOW (ref 150.0–400.0)
RBC: 3.96 Mil/uL — ABNORMAL LOW (ref 4.22–5.81)
RDW: 13.9 % (ref 11.5–15.5)
WBC: 4 10*3/uL (ref 4.0–10.5)

## 2020-02-23 LAB — LIPID PANEL
Cholesterol: 141 mg/dL (ref 0–200)
HDL: 72.7 mg/dL (ref >39.00)
LDL Cholesterol: 55 mg/dL (ref 0–99)
NonHDL: 68.72
Total CHOL/HDL Ratio: 2
Triglycerides: 71 mg/dL (ref 0.0–149.0)
VLDL: 14.2 mg/dL (ref 0.0–40.0)

## 2020-02-27 LAB — PROTEIN ELECTROPHORESIS, SERUM
Albumin ELP: 3.8 g/dL (ref 3.8–4.8)
Alpha 1: 0.2 g/dL (ref 0.2–0.3)
Alpha 2: 0.6 g/dL (ref 0.5–0.9)
Beta 2: 0.3 g/dL (ref 0.2–0.5)
Beta Globulin: 0.4 g/dL (ref 0.4–0.6)
Gamma Globulin: 0.8 g/dL (ref 0.8–1.7)
Total Protein: 6.2 g/dL (ref 6.1–8.1)

## 2020-03-01 ENCOUNTER — Ambulatory Visit (INDEPENDENT_AMBULATORY_CARE_PROVIDER_SITE_OTHER): Payer: Medicare HMO | Admitting: Family Medicine

## 2020-03-01 ENCOUNTER — Other Ambulatory Visit: Payer: Self-pay

## 2020-03-01 VITALS — BP 162/58 | HR 58 | Temp 98.0°F | Ht 72.5 in | Wt 277.6 lb

## 2020-03-01 DIAGNOSIS — D61818 Other pancytopenia: Secondary | ICD-10-CM | POA: Diagnosis not present

## 2020-03-01 DIAGNOSIS — E113299 Type 2 diabetes mellitus with mild nonproliferative diabetic retinopathy without macular edema, unspecified eye: Secondary | ICD-10-CM | POA: Diagnosis not present

## 2020-03-01 DIAGNOSIS — I10 Essential (primary) hypertension: Secondary | ICD-10-CM

## 2020-03-01 DIAGNOSIS — Z7189 Other specified counseling: Secondary | ICD-10-CM

## 2020-03-01 DIAGNOSIS — E7800 Pure hypercholesterolemia, unspecified: Secondary | ICD-10-CM | POA: Diagnosis not present

## 2020-03-01 DIAGNOSIS — Z0000 Encounter for general adult medical examination without abnormal findings: Secondary | ICD-10-CM | POA: Diagnosis not present

## 2020-03-01 DIAGNOSIS — Z23 Encounter for immunization: Secondary | ICD-10-CM

## 2020-03-01 NOTE — Progress Notes (Signed)
This visit occurred during the SARS-CoV-2 public health emergency.  Safety protocols were in place, including screening questions prior to the visit, additional usage of staff PPE, and extensive cleaning of exam room while observing appropriate contact time as indicated for disinfecting solutions.  I have personally reviewed the Medicare Annual Wellness questionnaire and have noted 1. The patient's medical and social history 2. Their use of alcohol, tobacco or illicit drugs 3. Their current medications and supplements 4. The patient's functional ability including ADL's, fall risks, home safety risks and hearing or visual             impairment. 5. Diet and physical activities 6. Evidence for depression or mood disorders  The patients weight, height, BMI have been recorded in the chart and visual acuity is per eye clinic.  I have made referrals, counseling and provided education to the patient based review of the above and I have provided the pt with a written personalized care plan for preventive services.  Provider list updated- see scanned forms.  Routine anticipatory guidance given to patient.  See health maintenance. The possibility exists that previously documented standard health maintenance information may have been brought forward from a previous encounter into this note.  If needed, that same information has been updated to reflect the current situation based on today's encounter.    Flu 2020 Shingles see EMR PNA up-to-date Tetanus 2011 Covid vaccine 2021 GI follow-up pending for colon cancer screening. Prostate cancer screening and PSA options (with potential risks and benefits of testing vs not testing) were discussed along with recent recs/guidelines.  He declined testing PSA at this point. Advance directive-wife designated patient were incapacitated Cognitive function addressed- see scanned forms- and if abnormal then additional documentation follows.   History of pancytopenia,  SPEP with no M spike, d/w pt. he is feeling well otherwise.  We talked about rechecking his labs yearly.  No ominous findings on his labs.  Diabetes discussed with patient.  Compliant with Metformin and Actos.  Already on statin.  Already on ACE.  No new foot symptoms.  Labs discussed with patient.  Hyperlipidemia discussed with patient.  He notes some flushing at night and he can try taking the niacin later at night.  No myalgia on medications.  Compliant with atorvastatin.  Hypertension.  No chest pain, shortness of breath, lower extremity edema.  He is noted these occasionally rolling his right ankle when he walks.  He has a history of relative instability in the ankle.  Discussed that he can try getting motion control shoes or using an ASO as needed.  PMH and SH reviewed  Meds, vitals, and allergies reviewed.   ROS: Per HPI.  Unless specifically indicated otherwise in HPI, the patient denies:  General: fever. Eyes: acute vision changes ENT: sore throat Cardiovascular: chest pain Respiratory: SOB GI: vomiting GU: dysuria Musculoskeletal: acute back pain Derm: acute rash Neuro: acute motor dysfunction Psych: worsening mood Endocrine: polydipsia Heme: bleeding Allergy: hayfever  GEN: nad, alert and oriented HEENT: ncat NECK: supple w/o LA CV: rrr. PULM: ctab, no inc wob ABD: soft, +bs EXT: no edema SKIN: no acute rash  Diabetic foot exam: Normal inspection No skin breakdown No calluses  Normal DP pulses Normal sensation to light touch and monofilament Nails normal

## 2020-03-04 NOTE — Assessment & Plan Note (Signed)
History of pancytopenia, SPEP with no M spike, d/w pt. he is feeling well otherwise.  We talked about rechecking his labs yearly.  No ominous findings on his labs.  He is not having night sweats or other symptoms that would direct further work-up at this point.

## 2020-03-04 NOTE — Assessment & Plan Note (Signed)
Compliant with Metformin and Actos.  Already on statin.  Already on ACE.  No new foot symptoms.  Labs discussed with patient.  Continue as is as above.  Recheck in 6 months.

## 2020-03-04 NOTE — Assessment & Plan Note (Signed)
Flu 2020 Shingles see EMR PNA up-to-date Tetanus 2011 Covid vaccine 2021 GI follow-up pending for colon cancer screening. Prostate cancer screening and PSA options (with potential risks and benefits of testing vs not testing) were discussed along with recent recs/guidelines.  He declined testing PSA at this point. Advance directive-wife designated patient were incapacitated Cognitive function addressed- see scanned forms- and if abnormal then additional documentation follows.

## 2020-03-04 NOTE — Assessment & Plan Note (Signed)
He notes some flushing at night and he can try taking the niacin later at night.  No myalgia on medications.  Compliant with atorvastatin.

## 2020-03-04 NOTE — Assessment & Plan Note (Signed)
Wife designated if patient were incapacitated.  

## 2020-03-04 NOTE — Assessment & Plan Note (Signed)
No chest pain, shortness of breath, lower extremity edema.  Continue ramipril as is.

## 2020-03-06 ENCOUNTER — Ambulatory Visit (AMBULATORY_SURGERY_CENTER): Payer: Medicare HMO | Admitting: Internal Medicine

## 2020-03-06 ENCOUNTER — Other Ambulatory Visit: Payer: Self-pay

## 2020-03-06 ENCOUNTER — Encounter: Payer: Self-pay | Admitting: Internal Medicine

## 2020-03-06 VITALS — BP 153/60 | HR 59 | Temp 96.9°F | Resp 15 | Ht 72.0 in | Wt 276.0 lb

## 2020-03-06 DIAGNOSIS — D123 Benign neoplasm of transverse colon: Secondary | ICD-10-CM | POA: Diagnosis not present

## 2020-03-06 DIAGNOSIS — Z86010 Personal history of colonic polyps: Secondary | ICD-10-CM

## 2020-03-06 DIAGNOSIS — D127 Benign neoplasm of rectosigmoid junction: Secondary | ICD-10-CM | POA: Diagnosis not present

## 2020-03-06 DIAGNOSIS — D128 Benign neoplasm of rectum: Secondary | ICD-10-CM | POA: Diagnosis not present

## 2020-03-06 DIAGNOSIS — D124 Benign neoplasm of descending colon: Secondary | ICD-10-CM | POA: Diagnosis not present

## 2020-03-06 DIAGNOSIS — D125 Benign neoplasm of sigmoid colon: Secondary | ICD-10-CM

## 2020-03-06 DIAGNOSIS — Z1211 Encounter for screening for malignant neoplasm of colon: Secondary | ICD-10-CM | POA: Diagnosis not present

## 2020-03-06 MED ORDER — SODIUM CHLORIDE 0.9 % IV SOLN: 500.0000 mL | Freq: Once | INTRAVENOUS | Status: DC

## 2020-03-06 NOTE — Progress Notes (Signed)
A/ox3, pleased with MAC, report to RN 

## 2020-03-06 NOTE — Patient Instructions (Addendum)
I found and removed 6 tiny polyps. No signs of cancer. I will let you know pathology results and when to have another routine colonoscopy by mail and/or My Chart.  I appreciate the opportunity to care for you. Gatha Mayer, MD, [facility]   Discharge instructions given. Handouts on polyps and diverticulosis. Resume previous medications. YOU HAD AN ENDOSCOPIC PROCEDURE TODAY AT [facility]:   Refer to the procedure report that was given to you for any specific questions about what was found during the examination.  If the procedure report does not answer your questions, please call your gastroenterologist to clarify.  If you requested that your care partner not be given the details of your procedure findings, then the procedure report has been included in a sealed envelope for you to review at your convenience later.  YOU SHOULD EXPECT: Some feelings of bloating in the abdomen. Passage of more gas than usual.  Walking can help get rid of the air that was put into your GI tract during the procedure and reduce the bloating. If you had a lower endoscopy (such as a colonoscopy or flexible sigmoidoscopy) you may notice spotting of blood in your stool or on the toilet paper. If you underwent a bowel prep for your procedure, you may not have a normal bowel movement for a few days.  Please Note:  You might notice some irritation and congestion in your nose or some drainage.  This is from the oxygen used during your procedure.  There is no need for concern and it should clear up in a day or so.  SYMPTOMS TO REPORT IMMEDIATELY:   Following lower endoscopy (colonoscopy or flexible sigmoidoscopy):  Excessive amounts of blood in the stool  Significant tenderness or worsening of abdominal pains  Swelling of the abdomen that is new, acute  Fever of 100F or higher   For urgent or emergent issues, a gastroenterologist can be reached at any hour by calling [phone]. Do not use MyChart  messaging for urgent concerns.    DIET:  We do recommend a small meal at first, but then you may proceed to your regular diet.  Drink plenty of fluids but you should avoid alcoholic beverages for 24 hours.  ACTIVITY:  You should plan to take it easy for the rest of today and you should NOT DRIVE or use heavy machinery until tomorrow (because of the sedation medicines used during the test).    FOLLOW UP: Our staff will call the number listed on your records 48-72 hours following your procedure to check on you and address any questions or concerns that you may have regarding the information given to you following your procedure. If we do not reach you, we will leave a message.  We will attempt to reach you two times.  During this call, we will ask if you have developed any symptoms of COVID 19. If you develop any symptoms (ie: fever, flu-like symptoms, shortness of breath, cough etc.) before then, please call [phone].  If you test positive for Covid 19 in the 2 weeks post procedure, please call and report this information to [place].    If any biopsies were taken you will be contacted by phone or by letter within the next 1-3 weeks.  Please call us at [phone] if you have not heard about the biopsies in 3 weeks.    SIGNATURES/CONFIDENTIALITY: You and/or your care partner have signed paperwork which will be entered into your electronic medical record.  These signatures attest to the fact that that the information above on your After Visit Summary has been reviewed and is understood.  Full responsibility of the confidentiality of this discharge information lies with you and/or your care-partner.

## 2020-03-06 NOTE — Progress Notes (Signed)
Pt's states no medical or surgical changes since previsit or office visit. [place] vitals and AW IV.

## 2020-03-06 NOTE — Op Note (Signed)
[place] Patient Name: Dennis Wise Procedure Date: 03/06/2020 9:31 AM MRN: [id number] Endoscopist: Gatha Mayer , MD Age: 69 Referring MD:  Date of Birth: 22-Feb-1951 Gender: Male Account #: [id number] Procedure:                Colonoscopy Indications:              Surveillance: Personal history of adenomatous                            polyps on last colonoscopy > 5 years ago, Last                            colonoscopy: 2014 Medicines:                Propofol per Anesthesia, Monitored Anesthesia Care Procedure:                Pre-Anesthesia Assessment:                           - Prior to the procedure, a History and Physical                            was performed, and patient medications and                            allergies were reviewed. The patient's tolerance of                            previous anesthesia was also reviewed. The risks                            and benefits of the procedure and the sedation                            options and risks were discussed with the patient.                            All questions were answered, and informed consent                            was obtained. Prior Anticoagulants: The patient has                            taken no previous anticoagulant or antiplatelet                            agents. ASA Grade Assessment: II - A patient with                            mild systemic disease. After reviewing the risks                            and benefits, the patient was deemed in  satisfactory condition to undergo the procedure.                           After obtaining informed consent, the colonoscope                            was passed under direct vision. Throughout the                            procedure, the patient's blood pressure, pulse, and                            oxygen saturations were monitored continuously. The                            Colonoscope was introduced  through the anus and                            advanced to the the cecum, identified by                            appendiceal orifice and ileocecal valve. The                            colonoscopy was somewhat difficult due to                            significant looping. Successful completion of the                            procedure was aided by straightening and shortening                            the scope to obtain bowel loop reduction. The                            patient tolerated the procedure well. The quality                            of the bowel preparation was good. The ileocecal                            valve, appendiceal orifice, and rectum were                            photographed. The bowel preparation used was                            Miralax via split dose instruction. Scope In: 9:46:06 AM Scope Out: 10:15:43 AM Scope Withdrawal Time: 0 hours 22 minutes 56 seconds  Total Procedure Duration: 0 hours 29 minutes 37 seconds  Findings:                 The perianal and digital rectal examinations were  normal.                           Six sessile polyps were found in the rectum,                            sigmoid colon, descending colon and transverse                            colon. The polyps were diminutive in size. These                            polyps were removed with a cold snare. Resection                            and retrieval were complete. Verification of                            patient identification for the specimen was done.                            Estimated blood loss was minimal.                           A few small-mouthed diverticula were found in the                            sigmoid colon.                           The exam was otherwise without abnormality on                            direct and retroflexion views. Complications:            No immediate complications. Estimated Blood Loss:      Estimated blood loss was minimal. Impression:               - Six diminutive polyps in the rectum, in the                            sigmoid colon, in the descending colon and in the                            transverse colon, removed with a cold snare.                            Resected and retrieved.                           - Diverticulosis in the sigmoid colon.                           - The examination was otherwise normal on direct  and retroflexion views.                           - Personal history of colonic polyps. 2013 10+                            adenomas none 2014 Recommendation:           - Patient has a contact number available for                            emergencies. The signs and symptoms of potential                            delayed complications were discussed with the                            patient. Return to normal activities tomorrow.                            Written discharge instructions were provided to the                            patient.                           - Resume previous diet.                           - Continue present medications.                           - Repeat colonoscopy is recommended for                            surveillance. The colonoscopy date will be                            determined after pathology results from today's                            exam become available for review. Gatha Mayer, MD 03/06/2020 10:23:30 AM This report has been signed electronically.

## 2020-03-06 NOTE — Progress Notes (Signed)
Called to room to assist during endoscopic procedure.  Patient ID and intended procedure confirmed with present staff. Received instructions for my participation in the procedure from the performing physician.  

## 2020-03-08 ENCOUNTER — Telehealth: Payer: Self-pay

## 2020-03-08 NOTE — Telephone Encounter (Signed)
  Follow up Call-  Call back number 03/06/2020  Post procedure Call Back phone  # [phone]  Permission to leave phone message Yes  Some recent data might be hidden     Patient questions:  Do you have a fever, pain , or abdominal swelling? No. Pain Score  0 *  Have you tolerated food without any problems? Yes.    Have you been able to return to your normal activities? Yes.    Do you have any questions about your discharge instructions: Diet   No. Medications  No. Follow up visit  No.  Do you have questions or concerns about your Care? No.  Actions: * If pain score is 4 or above: No action needed, pain <4. 1. Have you developed a fever since your procedure? no  2.   Have you had an respiratory symptoms (SOB or cough) since your procedure? no  3.   Have you tested positive for COVID 19 since your procedure no  4.   Have you had any family members/close contacts diagnosed with the COVID 19 since your procedure?  no   If yes to any of these questions please route to Joylene John, RN and Joella Prince, RN

## 2020-03-11 ENCOUNTER — Other Ambulatory Visit: Payer: Self-pay | Admitting: Family Medicine

## 2020-03-12 NOTE — Telephone Encounter (Signed)
Pharmacy requests refill on: Glimepiride 2 mg, Metformin Hydrochloride 850 mg, Atorvastatin, Ploglitazone HCL 45 mg, Ramipril 5 mg  LAST REFILL: 12/12/2019 LAST OV: 03/01/2020 NEXT OV: 08/30/2020 PHARMACY: [facility]   Last HgbA1C (02/23/2020): 8.1

## 2020-03-13 ENCOUNTER — Other Ambulatory Visit: Payer: Self-pay

## 2020-03-13 DIAGNOSIS — E113299 Type 2 diabetes mellitus with mild nonproliferative diabetic retinopathy without macular edema, unspecified eye: Secondary | ICD-10-CM

## 2020-03-13 MED ORDER — ACCU-CHEK SMARTVIEW VI STRP: ORAL_STRIP | 3 refills | Status: DC

## 2020-03-18 ENCOUNTER — Encounter: Payer: Self-pay | Admitting: Internal Medicine

## 2020-03-20 DIAGNOSIS — Z03818 Encounter for observation for suspected exposure to other biological agents ruled out: Secondary | ICD-10-CM | POA: Diagnosis not present

## 2020-05-16 ENCOUNTER — Telehealth: Payer: Self-pay | Admitting: Family Medicine

## 2020-05-16 NOTE — Chronic Care Management (AMB) (Signed)
  Chronic Care Management   Note  05/16/2020 Name: DONEVAN BILLER MRN: [id number] DOB: 1950/05/11  TAKOTA CAHALAN is a 70 y.o. year old male who is a primary care patient of [name], MD. I reached out to [name] by phone today in response to a referral sent by Mr. Sharol Roussel Strojny's PCP, [name], MD.   Mr. Rudman was given information about Chronic Care Management services today including:  1. CCM service includes personalized support from designated clinical staff supervised by his physician, including individualized plan of care and coordination with other care providers 2. 24/7 contact phone numbers for assistance for urgent and routine care needs. 3. Service will only be billed when office clinical staff spend 20 minutes or more in a month to coordinate care. 4. Only one practitioner may furnish and bill the service in a calendar month. 5. The patient may stop CCM services at any time (effective at the end of the month) by phone call to the office staff.   Patient agreed to services and verbal consent obtained.   Follow up plan:   [place]

## 2020-06-11 DIAGNOSIS — Z9842 Cataract extraction status, left eye: Secondary | ICD-10-CM | POA: Diagnosis not present

## 2020-06-11 DIAGNOSIS — E113513 Type 2 diabetes mellitus with proliferative diabetic retinopathy with macular edema, bilateral: Secondary | ICD-10-CM | POA: Diagnosis not present

## 2020-06-11 DIAGNOSIS — Z7984 Long term (current) use of oral hypoglycemic drugs: Secondary | ICD-10-CM | POA: Diagnosis not present

## 2020-06-11 DIAGNOSIS — Z961 Presence of intraocular lens: Secondary | ICD-10-CM | POA: Diagnosis not present

## 2020-06-11 DIAGNOSIS — Z9841 Cataract extraction status, right eye: Secondary | ICD-10-CM | POA: Diagnosis not present

## 2020-06-11 DIAGNOSIS — E113593 Type 2 diabetes mellitus with proliferative diabetic retinopathy without macular edema, bilateral: Secondary | ICD-10-CM | POA: Diagnosis not present

## 2020-06-11 DIAGNOSIS — H02834 Dermatochalasis of left upper eyelid: Secondary | ICD-10-CM | POA: Diagnosis not present

## 2020-06-11 DIAGNOSIS — H02831 Dermatochalasis of right upper eyelid: Secondary | ICD-10-CM | POA: Diagnosis not present

## 2020-06-15 ENCOUNTER — Telehealth: Payer: Self-pay

## 2020-06-15 NOTE — Chronic Care Management (AMB) (Addendum)
Chronic Care Management Pharmacy Assistant   Name: Dennis Wise  MRN: [id number] DOB: 05/27/1950  Reason for Encounter: Initial questions for CCM visit scheduled 06/21/20  PCP : [name], MD  Allergies:   Allergies  Allergen Reactions   Promethazine Hcl     REACTION: AGITIATION   Other Anxiety    phenerol made patient have anxiety    Medications: Outpatient Encounter Medications as of 06/15/2020  Medication Sig   ACCU-CHEK FASTCLIX LANCETS MISC Use to test blood sugar once daily or as needed.  Diagnosis:  E11.3299  Non insulin dependent.   aspirin 81 MG tablet Take 81 mg by mouth daily.     atorvastatin (LIPITOR) 10 MG tablet TAKE 1 TABLET (10 MG TOTAL) BY MOUTH AT BEDTIME.   Blood Glucose Monitoring Suppl (ACCU-CHEK NANO SMARTVIEW) w/Device KIT Use to check blood sugar once daily or as needed.  Diagnosis: E11.3299  Non insulin dependent.   glimepiride (AMARYL) 2 MG tablet TAKE 1 TABLET EVERY DAY BEFORE BREAKFAST   glucose blood (ACCU-CHEK SMARTVIEW) test strip Use as instructed to test blood sugar once daily or as needed.  Diagnosis:  E11.3299  Non insulin dependent.   metFORMIN (GLUCOPHAGE) 850 MG tablet TAKE 1 TABLET TWICE DAILY   Multiple Vitamin (MULTIVITAMIN) tablet Take 1 tablet by mouth daily.     niacin (NIASPAN) 500 MG CR tablet TAKE 3 TABLETS AT BEDTIME   pioglitazone (ACTOS) 45 MG tablet TAKE 1 TABLET EVERY DAY   ramipril (ALTACE) 5 MG capsule TAKE 1 CAPSULE (5 MG TOTAL) BY MOUTH DAILY.   No facility-administered encounter medications on file as of 06/15/2020.    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 No  Any changes in your medications or health? No  Any side effects from any medications? No  Do you have an symptoms or problems not managed by your medications? No  Any concerns about your health right now? No  Has your provider asked that you check blood pressure, blood sugar, or follow special diet at home? Yes but does not check often   Do you get any type of exercise on a regular basis? Yes- very active  Can you think of a goal you would like to reach for your health? No  Do you have any problems getting your medications? No Patient's preferred pharmacy is:  [place], [place] [address] Phone: [phone] Fax: [phone]  [id number] [place] [id number]: [phone]: [phone]  Is there anything that you would like to discuss during the appointment? No   Dennis SEEMANwas reminded to have all medications, supplements and any blood glucose and blood pressure readings available for review with  [name], Pharm. D, at his telephone visit on 06/21/20 at 2:00 .   Follow-Up:  Pharmacist Review  [name], CPP notified  Margaretmary Dys, [place] Assistant [phone]  I have reviewed the care management and care coordination activities outlined in this encounter and I am certifying that I agree with the content of this note. No further action required.  [name], PharmD Clinical Pharmacist [facility] [phone]

## 2020-06-21 ENCOUNTER — Other Ambulatory Visit: Payer: Self-pay

## 2020-06-21 ENCOUNTER — Ambulatory Visit (INDEPENDENT_AMBULATORY_CARE_PROVIDER_SITE_OTHER): Payer: Medicare HMO

## 2020-06-21 DIAGNOSIS — E113299 Type 2 diabetes mellitus with mild nonproliferative diabetic retinopathy without macular edema, unspecified eye: Secondary | ICD-10-CM | POA: Diagnosis not present

## 2020-06-21 DIAGNOSIS — E7800 Pure hypercholesterolemia, unspecified: Secondary | ICD-10-CM

## 2020-06-21 DIAGNOSIS — I10 Essential (primary) hypertension: Secondary | ICD-10-CM

## 2020-06-21 NOTE — Progress Notes (Addendum)
Chronic Care Management Pharmacy Note  07/18/2020 Name:  Dennis Wise MRN:  [id number] DOB:  May 25, 1950  Subjective: Dennis Wise is an 70 y.o. year old male who is a primary patient of Damita Dunnings, Elveria Rising, MD.  The CCM team was consulted for assistance with disease management and care coordination needs.    Engaged with patient by telephone for initial visit in response to provider referral for pharmacy case management and/or care coordination services.   Consent to Services:  The patient was given the following information about Chronic Care Management services today, agreed to services, and gave verbal consent: 1. CCM service includes personalized support from designated clinical staff supervised by the primary care provider, including individualized plan of care and coordination with other care providers 2. 24/7 contact phone numbers for assistance for urgent and routine care needs. 3. Service will only be billed when office clinical staff spend 20 minutes or more in a month to coordinate care. 4. Only one practitioner may furnish and bill the service in a calendar month. 5.The patient may stop CCM services at any time (effective at the end of the month) by phone call to the office staff. 6. The patient will be responsible for cost sharing (co-pay) of up to 20% of the service fee (after annual deductible is met). Patient agreed to services and consent obtained.  Patient Care Team: [name], MD as PCP - General (Family Medicine) Gearlean Alf, MD as Referring Physician (Ophthalmology) Nicholas Lose, MD as Consulting Physician (Hematology and Oncology) [name], [facility] as Pharmacist (Pharmacist)  CCM Consent 05/16/20  Recent office visits: 03/01/20 - PCP - HTN, no chest pain, SOB, edema. Diabetes discussed with patient.  Compliant with Metformin and Actos.  Already on statin.  Already on ACE.  No new foot symptoms. Hyperlipidemia discussed with patient.  He notes some flushing  at night and he can try taking the niacin later at night.  No myalgia on medications.  Compliant with atorvastatin. Continue current meds   Recent consult visits: 06/11/20 - Diabetes eye exam, PDR with diabetic macular edema both eyes. RTC 12 weeks.  02/15/20 - Diabetic eye exam   Hospital visits: None in previous 6 months  Objective:  Lab Results  Component Value Date   CREATININE 1.34 02/23/2020   BUN 29 (H) 02/23/2020   GFR 53.25 (L) 02/23/2020   GFRNONAA 92.28 10/16/2009   GFRAA 111 08/24/2007   NA 138 02/23/2020   K 4.8 02/23/2020   CALCIUM 9.0 02/23/2020   CO2 29 02/23/2020   Lab Results  Component Value Date/Time   HGBA1C 8.1 (H) 02/23/2020 07:59 AM   HGBA1C 8.7 (A) 08/29/2019 08:13 AM   HGBA1C 7.4 (H) 02/22/2019 08:06 AM   GFR 53.25 (L) 02/23/2020 07:59 AM   GFR 65.37 02/22/2019 08:06 AM   MICROALBUR 8.6 (H) 10/16/2009 08:43 AM   MICROALBUR 5.3 (H) 08/22/2008 08:52 AM    Last diabetic Eye exam: 06/11/20 retinopathy both eyes Last diabetic Foot exam: 03/01/20 PCP normal  Lab Results  Component Value Date   CHOL 141 02/23/2020   HDL 72.70 02/23/2020   LDLCALC 55 02/23/2020   LDLDIRECT 74.0 10/16/2015   TRIG 71.0 02/23/2020   CHOLHDL 2 02/23/2020    Hepatic Function Latest Ref Rng & Units 02/23/2020 02/23/2020 02/22/2019  Total Protein 6.1 - 8.1 g/dL 6.2 6.1 6.4  Albumin 3.5 - 5.2 g/dL - 4.0 -  AST 0 - 37 U/L - 18 -  ALT 0 -  53 U/L - 18 -  Alk Phosphatase 39 - 117 U/L - 96 -  Total Bilirubin 0.2 - 1.2 mg/dL - 0.7 -  Bilirubin, Direct 0.0 - 0.3 mg/dL - - -    Lab Results  Component Value Date/Time   TSH 2.33 10/16/2009 08:43 AM   TSH 2.24 08/22/2008 08:52 AM    CBC Latest Ref Rng & Units 02/23/2020 02/22/2019 02/02/2018  WBC 4.0 - 10.5 K/uL 4.0 3.6(L) 4.0  Hemoglobin 13.0 - 17.0 g/dL 12.0(L) 11.6(L) 12.5(L)  Hematocrit 39.0 - 52.0 % 34.5(L) 34.5(L) 36.6(L)  Platelets 150.0 - 400.0 K/uL 95.0(L) 106.0(L) 103.0(L)    No results found for:  VD25OH  Clinical ASCVD: No  The 10-year ASCVD risk score Mikey Bussing DC Jr., et al., 2013) is: 50.4%   Values used to calculate the score:     Age: 64 years     Sex: Male     Is Non-Hispanic African American: No     Diabetic: Yes     Tobacco smoker: No     Systolic Blood Pressure: 500 mmHg     Is BP treated: Yes     HDL Cholesterol: 72.7 mg/dL     Total Cholesterol: 141 mg/dL    Depression screen [facility] 2/9 03/01/2020 02/21/2019 02/09/2018  Decreased Interest 0 0 0  Down, Depressed, Hopeless 0 0 0  PHQ - 2 Score 0 0 0  Altered sleeping - 0 -  Tired, decreased energy - 0 -  Change in appetite - 0 -  Feeling bad or failure about yourself  - 0 -  Trouble concentrating - 0 -  Moving slowly or fidgety/restless - 0 -  Suicidal thoughts - 0 -  PHQ-9 Score - 0 -  Difficult doing work/chores - Not difficult at all -    Social History   Tobacco Use  Smoking Status Never Smoker  Smokeless Tobacco Never Used   BP Readings from Last 3 Encounters:  03/06/20 (!) 153/60  03/01/20 (!) 162/58  08/29/19 (!) 142/62   Pulse Readings from Last 3 Encounters:  03/06/20 (!) 59  03/01/20 (!) 58  08/29/19 (!) 59   Wt Readings from Last 3 Encounters:  03/06/20 276 lb (125.2 kg)  03/01/20 277 lb 9 oz (125.9 kg)  02/21/20 276 lb (125.2 kg)    Assessment/Interventions: Review of patient past medical history, allergies, medications, health status, including review of consultants reports, laboratory and other test data, was performed as part of comprehensive evaluation and provision of chronic care management services.   SDOH:  (Social Determinants of Health) assessments and interventions performed: Yes SDOH Interventions   Flowsheet Row Most Recent Value  SDOH Interventions   Financial Strain Interventions Intervention Not Indicated  [Meds affordable]      CCM Care Plan  Allergies  Allergen Reactions  . Promethazine Hcl     REACTION: AGITIATION  . Other Anxiety    phenerol made patient have  anxiety    Medications Reviewed Today    Reviewed by [name], [facility] (Pharmacist) on 07/18/20 at [place] List Status: <None>  Medication Order Taking? Sig Documenting Provider Last Dose Status Informant  ACCU-CHEK FASTCLIX LANCETS [place] [id number] Yes Use to test blood sugar once daily or as needed.  Diagnosis:  E11.3299  Non insulin dependent. [name], MD Taking Active   aspirin 81 MG tablet [id number] Yes Take 81 mg by mouth daily. [provider] Taking Active   atorvastatin (LIPITOR) 10 MG tablet [id number] Yes TAKE 1  TABLET (10 MG TOTAL) BY MOUTH AT BEDTIME. [name], MD Taking Active   Blood Glucose Monitoring Suppl (ACCU-CHEK NANO SMARTVIEW) w/Device KIT [id number] Yes Use to check blood sugar once daily or as needed.  Diagnosis: E11.3299  Non insulin dependent. [name], MD Taking Active   glimepiride (AMARYL) 2 MG tablet [id number] Yes TAKE 1 TABLET EVERY DAY BEFORE BREAKFAST [name], MD Taking Active   glucose blood (ACCU-CHEK SMARTVIEW) test strip [id number] Yes Use as instructed to test blood sugar once daily or as needed.  Diagnosis:  E11.3299  Non insulin dependent. [name], MD Taking Active   metFORMIN (GLUCOPHAGE) 850 MG tablet [id number] Yes TAKE 1 TABLET TWICE DAILY [name], MD Taking Active   Multiple Vitamin (MULTIVITAMIN) tablet [id number] Yes Take 1 tablet by mouth daily. Centrum Silver [provider] Taking Active   niacin (NIASPAN) 500 MG CR tablet [id number] Yes TAKE 3 TABLETS AT BEDTIME [name], MD Taking Active   pioglitazone (ACTOS) 45 MG tablet [id number] Yes TAKE 1 TABLET EVERY DAY [name], MD Taking Active   ramipril (ALTACE) 5 MG capsule [id number] Yes TAKE 1 CAPSULE (5 MG TOTAL) BY MOUTH DAILY. [name], MD Taking Active           Patient Active Problem List   Diagnosis Date Noted  . Dupuytren contracture 03/03/2019  . Healthcare maintenance 02/03/2017  . Hearing loss  10/20/2014  . Obesity (BMI 30-39.9) 10/16/2013  . Advance care planning 10/16/2013  . Actinic keratosis 10/16/2013  . Pancytopenia, acquired ([place]) 05/01/2011  . Medicare annual wellness visit, subsequent 10/24/2010  . FOOT PAIN, RIGHT 01/08/2010  . Diabetic retinopathy ([place]) 03/21/2005  . Essential hypertension 11/20/1999  . GILBERT'S SYNDROME,  6/99 09/19/1997  . GERD 01/19/1989  . DM type 2 with diabetic background retinopathy ([place]) 04/21/1988  . HYPERCHOLESTEROLEMIA  [phone]) 1990 04/21/1988    Immunization History  Administered Date(s) Administered  . Fluad Quad(high Dose 65+) 02/28/2019, 03/01/2020  . Influenza Split 03/03/2011, 02/03/2012  . Influenza,inj,Quad PF,6+ Mos 02/28/2013, 02/20/2015, 05/12/2016, 01/30/2017, 02/09/2018  . PFIZER(Purple Top)SARS-COV-2 Vaccination 06/20/2019, 07/11/2019  . Pneumococcal Conjugate-13 10/14/2013  . Pneumococcal Polysaccharide-23 08/22/1998, 03/03/2011, 01/30/2017  . Td 08/22/1998, 10/16/2009  . Zoster 02/28/2013    Conditions to be addressed/monitored:  Hypertension, Hyperlipidemia and Diabetes  Care Plan : [place]  Updates made by [name], [facility] since 07/18/2020 12:00 AM    Problem: CHL AMB "PATIENT-SPECIFIC PROBLEM"     Long-Range Goal: Disease Management   Start Date: 06/21/2020  Priority: High  Note:    Current Barriers:  . Unable to maintain control of diabetes . Office BP elevated  Pharmacist Clinical Goal(s):  Marland Kitchen Over the next 90 days, patient will achieve adherence to monitoring guidelines and medication adherence to achieve therapeutic efficacy through collaboration with PharmD and provider.   Interventions: . 1:1 collaboration with [name], MD regarding development and update of comprehensive plan of care as evidenced by provider attestation and co-signature . Inter-disciplinary care team collaboration (see longitudinal plan of care) . Comprehensive medication review performed;  medication list updated in electronic medical record  Hypertension (BP goal <140/90) -Not ideally controlled - elevated BP in clinic, pt reports which coat syndrome, does not have any home readings today. -Current treatment: . Ramipril 5 mg - 1 capsule daily (confirms) -Medications previously tried: none   -Current home readings: Has not checked BP at home in the past month, reports BP at  [place] 1 week ago, 159/45. Reports he has an automatic BP monitor with arm cuff.  Reports he has white coat syndrome. Reports BP is always better at home than in the office - states home BP 135-180/60.  -Current exercise habits: does physical work daily, walking  -Denies hypotensive/hypertensive symptoms -Educated on BP goals and benefits of medications for prevention of heart attack, stroke and kidney damage; Importance of home blood pressure monitoring; Proper BP monitoring technique; -Counseled to monitor BP at home several days, document, and provide log at future appointments -Recommended to continue current medication; Pt will check BP for 5-7 days this month with goal < 140/90.  Limit salt intake.  Hyperlipidemia: (LDL goal < 70) -Controlled - LDL 55 -Current treatment: . Atorvastatin 10 mg - 1 tablet daily . Niacin 500 mg - 3 tablets at bedtime (occasional flushing) . Aspirin 81 mg - 1 tablet daily  -Medications previously tried: none -Limit changes today but will consider niacin d/c in future due to ADRs and limited benefit  -Educated on Benefits of statin for ASCVD risk reduction; -Recommended to continue current medication  Diabetes (A1c goal < 7%) -Uncontrolled - A1c 8.1 % > 3 months ago (11/21) -Current medications: . Actos 45 mg - 1 tablet daily . Metformin 850 mg - 1 tablet twice daily with meals . Glimepiride 2 mg - 1 tablet every day before breakfast -Medications previously tried: none  -Current home glucose readings - checks occasionally . fasting glucose:  100-160 . post prandial glucose: none . Denies BG < 70 -Denies hypoglycemic/hyperglycemic symptoms -Current meal patterns: eats a lot of protein and salads, limits carbs  . snacks: fruit, apples or bananas, salty peanuts . drinks: no sugary drinks, water 2-3 quarts daily . Gets catered meals through work which are not necessarily diabetic friendly -Educated onA1c and blood sugar goals; Complications of diabetes including kidney damage, retinal damage, and cardiovascular disease; -Counseled to check feet daily and get yearly eye exams -He is open to any new oral medications. Prefers to avoid needles. Discussed cost - [employer]. No costconcerns at this time. -Dr. Damita Dunnings does foot exam at every physical.  -Complications - diabetic retinopathy, sees eye doctor every 3 months -Recommended to continue current medication; Would like to re-eval A1c at next PCP visit. If still above 7%, consider adding SGLT-2 for renal protection. Pt would like to avoid injections. Can apply for PAP if cost is concern.  Denies any additional OTCs - Uses Tylenol on occasion for pain  Patient Goals/Self-Care Activities . Over the next 90 days, patient will:  - check glucose before breakfast and bedtime for 5-7 days and keep log, document, and provide at future appointments check blood pressure 5-7 days and keep log, document, and provide at future appointments  Follow Up Plan: The care management team will reach out to the patient again over the next 30 days.      Medication Assistance: None required.  Patient affirms current coverage meets needs.  Patient's preferred pharmacy is:  [facility], [address] [place] [address] Phone: [phone] Fax: [phone]  [id number] [place] [id number]: [phone]: [phone] For urgent needs - [facility] Denies any  concerns with HState LineUses pill box? Yes Pt endorses 100% compliance; Takes pills from organizer either at breakfast (2) or bedtime (all others).   We discussed:  Current pharmacy is preferred with insurance plan and patient is satisfied with pharmacy services Patient decided to: Continue current medication management strategy  Care Plan and Follow Up Patient Decision:  Patient agrees to Care Plan and Follow-up.  Plan:  --CMA call for BG and BP log April 2022 --PCP visit Aug 30, 2020  [name], PharmD Clinical Pharmacist [facility] [phone]  Encounter details: CCM Time Spent      Value Time User   Time spent with patient (minutes)  90 07/18/2020 11:02 AM [name], [facility]   Time spent performing Chart review  30 07/18/2020 11:02 AM [name], [facility]   Total time (minutes)  120 07/18/2020 11:02 AM [name], RPH     Moderate to High Complex Decision Making      Value Time User   Moderate to High complex decision making  Yes 07/18/2020 11:02 AM [name], [facility]     CCM Services: This encounter meets complex CCM services and moderate to high decision making.  Prior to outreach and patient consent for Chronic Care Management, I referred this patient for services after reviewing the nominated patient list or from a personal encounter with the patient.  I have personally reviewed this encounter including the documentation in this note and have collaborated with the care management provider regarding care management and care coordination activities to include development and update of the comprehensive care plan. I am certifying that I agree with the content of this note and encounter as supervising physician. Elsie Stain

## 2020-07-18 NOTE — Patient Instructions (Signed)
Dear Dennis Wise,  It was a pleasure meeting you during our initial appointment on June 21, 2020. Below is a summary of the goals we discussed and components of chronic care management. Please contact me anytime with questions or concerns.   Visit Information   Patient Care Plan: CCM Pharmacy Care Plan    Problem Identified: [facility] AMB "PATIENT-SPECIFIC PROBLEM"     Long-Range Goal: Disease Management   Start Date: 06/21/2020  Priority: High  Note:    Current Barriers:  . Unable to maintain control of diabetes . Office BP elevated  Pharmacist Clinical Goal(s):  Marland Kitchen Over the next 90 days, patient will achieve adherence to monitoring guidelines and medication adherence to achieve therapeutic efficacy through collaboration with PharmD and provider.   Interventions: . 1:1 collaboration with [name], MD regarding development and update of comprehensive plan of care as evidenced by provider attestation and co-signature . Inter-disciplinary care team collaboration (see longitudinal plan of care) . Comprehensive medication review performed; medication list updated in electronic medical record  Hypertension (BP goal <140/90) -Not ideally controlled - elevated BP in clinic, pt reports which coat syndrome, does not have any home readings today. -Current treatment: . Ramipril 5 mg - 1 capsule daily (confirms) -Medications previously tried: none   -Current home readings: Has not checked BP at home in the past month, reports BP at [facility] 1 week ago, 159/45. Reports he has an automatic BP monitor with arm cuff.  Reports he has white coat syndrome. Reports BP is always better at home than in the office - states home BP 135-180/60.  -Current exercise habits: does physical work daily, walking  -Denies hypotensive/hypertensive symptoms -Educated on BP goals and benefits of medications for prevention of heart attack, stroke and kidney damage; Importance of home blood pressure  monitoring; Proper BP monitoring technique; -Counseled to monitor BP at home several days, document, and provide log at future appointments -Recommended to continue current medication; Pt will check BP for 5-7 days this month with goal < 140/90.  Limit salt intake.  Hyperlipidemia: (LDL goal < 70) -Controlled - LDL 55 -Current treatment: . Atorvastatin 10 mg - 1 tablet daily . Niacin 500 mg - 3 tablets at bedtime (occasional flushing) . Aspirin 81 mg - 1 tablet daily  -Medications previously tried: none -Limit changes today but will consider niacin d/c in future due to ADRs and limited benefit  -Educated on Benefits of statin for ASCVD risk reduction; -Recommended to continue current medication  Diabetes (A1c goal < 7%) -Uncontrolled - A1c 8.1 % > 3 months ago (11/21) -Current medications: . Actos 45 mg - 1 tablet daily . Metformin 850 mg - 1 tablet twice daily with meals . Glimepiride 2 mg - 1 tablet every day before breakfast -Medications previously tried: none  -Current home glucose readings - checks occasionally . fasting glucose: 100-160 . post prandial glucose: none . Denies BG < 70 -Denies hypoglycemic/hyperglycemic symptoms -Current meal patterns: eats a lot of protein and salads, limits carbs  . snacks: fruit, apples or bananas, salty peanuts . drinks: no sugary drinks, water 2-3 quarts daily . Gets catered meals through work which are not necessarily diabetic friendly -Educated onA1c and blood sugar goals; Complications of diabetes including kidney damage, retinal damage, and cardiovascular disease; -Counseled to check feet daily and get yearly eye exams -He is open to any new oral medications. Prefers to avoid needles. Discussed cost - [employer]. No costconcerns at this time. -  Dr. Damita Dunnings does foot exam at every physical.  -Complications - diabetic retinopathy, sees eye doctor every 3 months -Recommended to continue current medication; Would like to re-eval A1c  at next PCP visit. If still above 7%, consider adding SGLT-2 for renal protection. Pt would like to avoid injections. Can apply for PAP if cost is concern.  Denies any additional OTCs - Uses Tylenol on occasion for pain  Patient Goals/Self-Care Activities . Over the next 90 days, patient will:  - check glucose before breakfast and bedtime for 5-7 days and keep log, document, and provide at future appointments check blood pressure 5-7 days and keep log, document, and provide at future appointments  Follow Up Plan: The care management team will reach out to the patient again over the next 30 days.      Dennis Wise was given information about Chronic Care Management services today including:  1. CCM service includes personalized support from designated clinical staff supervised by his physician, including individualized plan of care and coordination with other care providers 2. 24/7 contact phone numbers for assistance for urgent and routine care needs. 3. Standard insurance, coinsurance, copays and deductibles apply for chronic care management only during months in which we provide at least 20 minutes of these services. Most insurances cover these services at 100%, however patients may be responsible for any copay, coinsurance and/or deductible if applicable. This service may help you avoid the need for more expensive face-to-face services. 4. Only one practitioner may furnish and bill the service in a calendar month. 5. The patient may stop CCM services at any time (effective at the end of the month) by phone call to the office staff.  Patient agreed to services and verbal consent obtained.   The patient verbalized understanding of instructions, educational materials, and care plan provided today and agreed to receive a mailed copy of patient instructions, educational materials, and care plan.    [name], PharmD Clinical Pharmacist [facility] at [facility] [phone]  How to Take Your Blood Pressure Blood pressure measures how strongly your blood is pressing against the walls of your arteries. Arteries are blood vessels that carry blood from your heart throughout your body. You can take your blood pressure at home with a machine. You may need to check your blood pressure at home:  To check if you have high blood pressure (hypertension).  To check your blood pressure over time.  To make sure your blood pressure medicine is working. Supplies needed:  Blood pressure machine, or monitor.  Dining room chair to sit in.  Table or desk.  Small notebook.  Pencil or pen. How to prepare Avoid these things for 30 minutes before checking your blood pressure:  Having drinks with caffeine in them, such as coffee or tea.  Drinking alcohol.  Eating.  Smoking.  Exercising. Do these things five minutes before checking your blood pressure:  Go to the bathroom and pee (urinate).  Sit in a dining chair. Do not sit in a soft couch or an armchair.  Be quiet. Do not talk. How to take your blood pressure Follow the instructions that came with your machine. If you have a digital blood pressure monitor, these may be the instructions: 1. Sit up straight. 2. Place your feet on the floor. Do not cross your ankles or legs. 3. Rest your left arm at the level of your heart. You may rest it on a table, desk, or chair. 4. Pull up your shirt  sleeve. 5. Wrap the blood pressure cuff around the upper part of your left arm. The cuff should be 1 inch (2.5 cm) above your elbow. It is best to wrap the cuff around bare skin. 6. Fit the cuff snugly around your arm. You should be able to place only one finger between the cuff and your arm. 7. Place the cord so that it rests in the bend of your elbow. 8. Press the power button. 9. Sit quietly while the cuff fills with air and loses air. 10. Write down the numbers on the screen. 11. Wait 2-3 minutes and  then repeat steps 1-10.   What do the numbers mean? Two numbers make up your blood pressure. The first number is called systolic pressure. The second is called diastolic pressure. An example of a blood pressure reading is "120 over 80" (or 120/80). If you are an adult and do not have a medical condition, use this guide to find out if your blood pressure is normal: Normal  First number: below 120.  Second number: below 80. Elevated  First number: 120-129.  Second number: below 80. Hypertension stage 1  First number: 130-139.  Second number: 80-89. Hypertension stage 2  First number: 140 or above.  Second number: 61 or above. Your blood pressure is above normal even if only the top or bottom number is above normal. Follow these instructions at home:  Check your blood pressure as often as your doctor tells you to.  Check your blood pressure at the same time every day.  Take your monitor to your next doctor's appointment. Your doctor will: ? Make sure you are using it correctly. ? Make sure it is working right.  Make sure you understand what your blood pressure numbers should be.  Tell your doctor if your medicine is causing side effects.  Keep all follow-up visits as told by your doctor. This is important. General tips:  You will need a blood pressure machine, or monitor. Your doctor can suggest a monitor. You can buy one at a drugstore or online. When choosing one: ? Choose one with an arm cuff. ? Choose one that wraps around your upper arm. Only one finger should fit between your arm and the cuff. ? Do not choose one that measures your blood pressure from your wrist or finger. Where to find more information American Heart Association: www.heart.org Contact a doctor if:  Your blood pressure keeps being high. Get help right away if:  Your first blood pressure number is higher than 180.  Your second blood pressure number is higher than 120. Summary  Check your  blood pressure at the same time every day.  Avoid caffeine, alcohol, smoking, and exercise for 30 minutes before checking your blood pressure.  Make sure you understand what your blood pressure numbers should be. This information is not intended to replace advice given to you by your health care provider. Make sure you discuss any questions you have with your health care provider. Document Revised: 04/01/2019 Document Reviewed: 04/01/2019 Elsevier Patient Education  2021 [employer].

## 2020-07-19 ENCOUNTER — Telehealth: Payer: Self-pay

## 2020-07-19 NOTE — Chronic Care Management (AMB) (Signed)
    Chronic Care Management Pharmacy Assistant   Name: Dennis Wise  MRN: [id number] DOB: 1950-11-13   Reason for Encounter: Medication Review- Adherence   Recent office visits:  No recent office visits  Recent consult visits:  No recent consults  Hospital visits:  None in previous 6 months  Medications: Outpatient Encounter Medications as of 07/19/2020  Medication Sig  . ACCU-CHEK FASTCLIX LANCETS MISC Use to test blood sugar once daily or as needed.  Diagnosis:  E11.3299  Non insulin dependent.  Marland Kitchen aspirin 81 MG tablet Take 81 mg by mouth daily.  Marland Kitchen atorvastatin (LIPITOR) 10 MG tablet TAKE 1 TABLET (10 MG TOTAL) BY MOUTH AT BEDTIME.  Marland Kitchen Blood Glucose Monitoring Suppl (ACCU-CHEK NANO SMARTVIEW) w/Device KIT Use to check blood sugar once daily or as needed.  Diagnosis: E11.3299  Non insulin dependent.  Marland Kitchen glimepiride (AMARYL) 2 MG tablet TAKE 1 TABLET EVERY DAY BEFORE BREAKFAST  . glucose blood (ACCU-CHEK SMARTVIEW) test strip Use as instructed to test blood sugar once daily or as needed.  Diagnosis:  E11.3299  Non insulin dependent.  . metFORMIN (GLUCOPHAGE) 850 MG tablet TAKE 1 TABLET TWICE DAILY  . Multiple Vitamin (MULTIVITAMIN) tablet Take 1 tablet by mouth daily. Centrum Silver  . niacin (NIASPAN) 500 MG CR tablet TAKE 3 TABLETS AT BEDTIME  . pioglitazone (ACTOS) 45 MG tablet TAKE 1 TABLET EVERY DAY  . ramipril (ALTACE) 5 MG capsule TAKE 1 CAPSULE (5 MG TOTAL) BY MOUTH DAILY.   No facility-administered encounter medications on file as of 07/19/2020.   Reviewed patients chart to determine adherence to medications. Patient has not missed refills on any star rated drugs in the last 90 days.    Star Rating Drugs: Atorvastatin 10 mg 05/27/20 90 DS Glimepiride 2 mg 05/27/20 90 DS Metformin 850 mg 05/27/20 90 DS Pioglitazone 45 mg 05/27/20 90 DS Ramipril 5 mg  05/27/20 90 DS   Follow-Up:  Pharmacist Review  [name], CPP notified  Margaretmary Dys, [place] [facility]  Assistant [phone]

## 2020-07-27 ENCOUNTER — Telehealth: Payer: Self-pay

## 2020-07-27 NOTE — Chronic Care Management (AMB) (Addendum)
Chronic Care Management Pharmacy Assistant   Name: Dennis Wise  MRN: [id number] DOB: 1950-10-07  Reason for Encounter: Disease State- Hypertension and Diabetes   Conditions to be addressed/monitored: HTN and DMII  Recent office visits:  None since last CCM visit  Recent consult visits:  None since last CCM visit  Hospital visits:  None in previous 6 months  Medications: Outpatient Encounter Medications as of 07/27/2020  Medication Sig   ACCU-CHEK FASTCLIX LANCETS MISC Use to test blood sugar once daily or as needed.  Diagnosis:  E11.3299  Non insulin dependent.   aspirin 81 MG tablet Take 81 mg by mouth daily.   atorvastatin (LIPITOR) 10 MG tablet TAKE 1 TABLET (10 MG TOTAL) BY MOUTH AT BEDTIME.   Blood Glucose Monitoring Suppl (ACCU-CHEK NANO SMARTVIEW) w/Device KIT Use to check blood sugar once daily or as needed.  Diagnosis: E11.3299  Non insulin dependent.   glimepiride (AMARYL) 2 MG tablet TAKE 1 TABLET EVERY DAY BEFORE BREAKFAST   glucose blood (ACCU-CHEK SMARTVIEW) test strip Use as instructed to test blood sugar once daily or as needed.  Diagnosis:  E11.3299  Non insulin dependent.   metFORMIN (GLUCOPHAGE) 850 MG tablet TAKE 1 TABLET TWICE DAILY   Multiple Vitamin (MULTIVITAMIN) tablet Take 1 tablet by mouth daily. Centrum Silver   niacin (NIASPAN) 500 MG CR tablet TAKE 3 TABLETS AT BEDTIME   pioglitazone (ACTOS) 45 MG tablet TAKE 1 TABLET EVERY DAY   ramipril (ALTACE) 5 MG capsule TAKE 1 CAPSULE (5 MG TOTAL) BY MOUTH DAILY.   No facility-administered encounter medications on file as of 07/27/2020.     Recent Relevant Labs: Lab Results  Component Value Date/Time   HGBA1C 8.1 (H) 02/23/2020 07:59 AM   HGBA1C 8.7 (A) 08/29/2019 08:13 AM   HGBA1C 7.4 (H) 02/22/2019 08:06 AM   MICROALBUR 8.6 (H) 10/16/2009 08:43 AM   MICROALBUR 5.3 (H) 08/22/2008 08:52 AM    Kidney Function Lab Results  Component Value Date/Time   CREATININE 1.34 02/23/2020 07:59 AM    CREATININE 1.11 02/22/2019 08:06 AM   CREATININE 1.2 01/28/2016 08:04 AM   CREATININE 1.0 01/29/2015 10:34 AM   GFR 53.25 (L) 02/23/2020 07:59 AM   GFRNONAA 92.28 10/16/2009 08:43 AM   GFRAA 111 08/24/2007 10:15 AM     Current antihyperglycemic regimen:  Actos 45 mg - 1 tablet daily Metformin 850 mg - 1 tablet twice daily with meals Glimepiride 2 mg - 1 tablet every day before breakfast   Patient verbally confirms he is taking the above medications as directed. Yes  What recent interventions/DTPs have been made to improve glycemic control:  check glucose before breakfast and bedtime for 5-7 days and keep log, document, and provide at future appointments  Have there been any recent hospitalizations or ED visits since last visit with CPP? No  Patient denies hypoglycemic symptoms, including Pale, Sweaty, Shaky, Hungry, Nervous/irritable and Vision changes  Patient denies hyperglycemic symptoms, including blurry vision, excessive thirst, fatigue, polyuria and weakness  How often are you checking your blood sugar? States he checks less than once a week  What are your blood sugars ranging?  Fasting: 120 Before meals: N/A After meals: N/A Bedtime: N/A  On insulin? No  During the week, how often does your blood glucose drop below 70? Never states not recently.   Are you checking your feet daily/regularly? Yes  Adherence Review: Is the patient currently on a STATIN medication? Yes Is the patient currently on ACE/ARB medication? Yes  Does the patient have >5 day gap between last estimated fill dates? No    Recent Office Vitals: BP Readings from Last 3 Encounters:  03/06/20 (!) 153/60  03/01/20 (!) 162/58  08/29/19 (!) 142/62   Pulse Readings from Last 3 Encounters:  03/06/20 (!) 59  03/01/20 (!) 58  08/29/19 (!) 59    Wt Readings from Last 3 Encounters:  03/06/20 276 lb (125.2 kg)  03/01/20 277 lb 9 oz (125.9 kg)  02/21/20 276 lb (125.2 kg)     Kidney Function Lab  Results  Component Value Date/Time   CREATININE 1.34 02/23/2020 07:59 AM   CREATININE 1.11 02/22/2019 08:06 AM   CREATININE 1.2 01/28/2016 08:04 AM   CREATININE 1.0 01/29/2015 10:34 AM   GFR 53.25 (L) 02/23/2020 07:59 AM   GFRNONAA 92.28 10/16/2009 08:43 AM   GFRAA 111 08/24/2007 10:15 AM    BMP Latest Ref Rng & Units 02/23/2020 02/22/2019 02/02/2018  Glucose 70 - 99 mg/dL 140(H) 182(H) 148(H)  BUN 6 - 23 mg/dL 29(H) 21 23  Creatinine 0.40 - 1.50 mg/dL 1.34 1.11 1.17  Sodium 135 - 145 mEq/L 138 139 139  Potassium 3.5 - 5.1 mEq/L 4.8 4.3 4.6  Chloride 96 - 112 mEq/L 101 102 103  CO2 19 - 32 mEq/L '29 30 29  ' Calcium 8.4 - 10.5 mg/dL 9.0 9.0 9.5     Current antihypertensive regimen:  Ramipril 5 mg - 1 capsule daily  Patient verbally confirms he is taking the above medications as directed. Yes  How often are you checking your Blood Pressure? infrequently  Current home BP readings:   DATE:             BP               PULSE    155/60    160/65   Wrist or arm cuff: Arm Caffeine intake: Drinks quite a bit of caffeine Salt intake: Tries to limit OTC medications including pseudoephedrine or NSAIDs?  Any readings above 180/120? No If yes any symptoms of hypertensive emergency? patient denies any symptoms of high blood pressure   What recent interventions/DTPs have been made by any provider to improve Blood Pressure control since last CPP Visit:  Recommended to continue current medication; Pt will check BP for 5-7 days this month with goal < 140/90.  Limit salt intake.  Any recent hospitalizations or ED visits since last visit with CPP? No  What diet changes have been made to improve Blood Pressure Control?  Tries to eat a lot of salads.  What exercise is being done to improve your Blood Pressure Control?  States [place] is active[place] 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  Adherence Review: Is the patient currently on ACE/ARB medication? Yes Does the patient have >5 day gap between last  estimated fill dates? No   Star Rating Drugs:  Medication:  Last Fill: Day Supply Atorvastatin 10 mg  05/27/20  90 Glimepiride 2 mg 05/27/20  90 Metformin 850 mg 05/27/20  90 pioglitzone 45 mg 05/27/20  90 Ramipril 5 mg  05/27/20  90  Patient states he is doing well but does not check his blood pressure or blood sugar as often as he should. Notes blood sugar usually runs 120-130 and blood pressures usually run 150's-165/ 50-60. He denies having any symptoms of high blood pressure.   Follow-Up:  Pharmacist Review  [name], CPP notified  Margaretmary Dys, [place] Assistant [phone]  I have reviewed the care management  and care coordination activities outlined in this encounter and I am certifying that I agree with the content of this note.  [name], PharmD Clinical Pharmacist [facility] at [facility] [phone]

## 2020-07-31 ENCOUNTER — Other Ambulatory Visit: Payer: Self-pay | Admitting: Family Medicine

## 2020-08-15 NOTE — Progress Notes (Signed)
Attempted to contact patient to request he bring his BP log to his upcoming appointment 08/30/20 with Dr. Damita Dunnings. Left message for patient to return call.    [name], CPP notified  Margaretmary Dys, [place] [facility] Assistant [phone]

## 2020-08-30 ENCOUNTER — Ambulatory Visit: Payer: Medicare HMO | Admitting: Family Medicine

## 2020-09-03 DIAGNOSIS — Z9842 Cataract extraction status, left eye: Secondary | ICD-10-CM | POA: Diagnosis not present

## 2020-09-03 DIAGNOSIS — Z961 Presence of intraocular lens: Secondary | ICD-10-CM | POA: Diagnosis not present

## 2020-09-03 DIAGNOSIS — Z9841 Cataract extraction status, right eye: Secondary | ICD-10-CM | POA: Diagnosis not present

## 2020-09-03 DIAGNOSIS — E113513 Type 2 diabetes mellitus with proliferative diabetic retinopathy with macular edema, bilateral: Secondary | ICD-10-CM | POA: Diagnosis not present

## 2020-09-03 DIAGNOSIS — H02834 Dermatochalasis of left upper eyelid: Secondary | ICD-10-CM | POA: Diagnosis not present

## 2020-09-03 DIAGNOSIS — Z7984 Long term (current) use of oral hypoglycemic drugs: Secondary | ICD-10-CM | POA: Diagnosis not present

## 2020-09-03 DIAGNOSIS — H02831 Dermatochalasis of right upper eyelid: Secondary | ICD-10-CM | POA: Diagnosis not present

## 2020-09-03 LAB — HM DIABETES EYE EXAM

## 2020-09-10 ENCOUNTER — Ambulatory Visit (INDEPENDENT_AMBULATORY_CARE_PROVIDER_SITE_OTHER): Payer: Medicare HMO | Admitting: Family Medicine

## 2020-09-10 ENCOUNTER — Other Ambulatory Visit: Payer: Self-pay

## 2020-09-10 ENCOUNTER — Encounter: Payer: Self-pay | Admitting: Family Medicine

## 2020-09-10 VITALS — BP 124/68 | HR 69 | Temp 97.6°F | Ht 72.0 in | Wt 275.0 lb

## 2020-09-10 DIAGNOSIS — E119 Type 2 diabetes mellitus without complications: Secondary | ICD-10-CM | POA: Diagnosis not present

## 2020-09-10 DIAGNOSIS — M25373 Other instability, unspecified ankle: Secondary | ICD-10-CM | POA: Diagnosis not present

## 2020-09-10 DIAGNOSIS — E113299 Type 2 diabetes mellitus with mild nonproliferative diabetic retinopathy without macular edema, unspecified eye: Secondary | ICD-10-CM | POA: Diagnosis not present

## 2020-09-10 DIAGNOSIS — M549 Dorsalgia, unspecified: Secondary | ICD-10-CM | POA: Diagnosis not present

## 2020-09-10 LAB — POCT GLYCOSYLATED HEMOGLOBIN (HGB A1C): Hemoglobin A1C: 7.6 % — AB (ref 4.0–5.6)

## 2020-09-10 NOTE — Progress Notes (Signed)
This visit occurred during the SARS-CoV-2 public health emergency.  Safety protocols were in place, including screening questions prior to the visit, additional usage of staff PPE, and extensive cleaning of exam room while observing appropriate contact time as indicated for disinfecting solutions.  Diabetes:  Using medications without difficulties: yes.   Hypoglycemic episodes: no Hyperglycemic episodes: no Feet problems: see below.  No numbness.   Blood Sugars averaging:not checked often.   eye exam within last year: yes, d/w pt.   A1c improved.  D/w pt at OV.  He is eating salad with protein and trying to exercise.    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  He feels good about working.   He has had some lower back mild (not severe) pain, lower midline.  No specific injury.  Heat helps.  More pain with stairs, both up and down.  No radicular pain.  It immediately gets better sitting down.  He still has a tendency to roll R ankle at baseline.  Bracing didn't help.  Referral to ortho ordered.  Discussed.  Meds, vitals, and allergies reviewed.  ROS: Per HPI unless specifically indicated in ROS section   GEN: nad, alert and oriented HEENT: ncat NECK: supple w/o LA CV: rrr. PULM: ctab, no inc wob ABD: soft, +bs EXT: no edema SKIN: no acute rash, chronic brawny changes L shin at baseline.   Lower back not ttp locally SLR neg No pain in B hip int rotation.

## 2020-09-10 NOTE — Patient Instructions (Signed)
Don't change your meds for now.  Plan on recheck in about 6 months at a yearly visit with labs ahead of time.  We'll call about seeing ortho.  Use the back exercises in the meantime.  Take care.  Glad to see you.

## 2020-09-12 DIAGNOSIS — M549 Dorsalgia, unspecified: Secondary | ICD-10-CM | POA: Insufficient documentation

## 2020-09-12 DIAGNOSIS — M25373 Other instability, unspecified ankle: Secondary | ICD-10-CM | POA: Insufficient documentation

## 2020-09-12 HISTORY — DX: Other instability, unspecified ankle: M25.373

## 2020-09-12 NOTE — Assessment & Plan Note (Signed)
Refer to Ortho.  He agrees.

## 2020-09-12 NOTE — Assessment & Plan Note (Signed)
A1c improved.  Continue glimepiride Actos and metformin.  I thanked him for his effort.  Continue work on diet and exercise.  Recheck periodically.  He agrees with plan.

## 2020-09-12 NOTE — Assessment & Plan Note (Signed)
We talked about the possibility of spinal stenosis.  He will use back exercises in the meantime.  Handout given to patient and discussed and explained.  We will ask for orthopedic input.  Referred.

## 2020-09-19 ENCOUNTER — Telehealth: Payer: Self-pay

## 2020-09-19 NOTE — Chronic Care Management (AMB) (Addendum)
Chronic Care Management Pharmacy Assistant   Name: Dennis Wise  MRN: [id number] DOB: 1950-09-15    Reason for Encounter: Diabetes and Blood pressure Review   Recent office visits:  09/10/20 - Dr.Duncan PCP no medication changes follow up 6 months  Recent consult visits:  09/03/20 - Ophthalmology no medication changes  Hospital visits:  None in previous 6 months  Medications: Outpatient Encounter Medications as of 09/19/2020  Medication Sig   ACCU-CHEK FASTCLIX LANCETS MISC Use to test blood sugar once daily or as needed.  Diagnosis:  E11.3299  Non insulin dependent.   aspirin 81 MG tablet Take 81 mg by mouth daily.   atorvastatin (LIPITOR) 10 MG tablet TAKE 1 TABLET (10 MG TOTAL) BY MOUTH AT BEDTIME.   Blood Glucose Monitoring Suppl (ACCU-CHEK NANO SMARTVIEW) w/Device KIT Use to check blood sugar once daily or as needed.  Diagnosis: E11.3299  Non insulin dependent.   glimepiride (AMARYL) 2 MG tablet TAKE 1 TABLET EVERY DAY BEFORE BREAKFAST   glucose blood (ACCU-CHEK SMARTVIEW) test strip Use as instructed to test blood sugar once daily or as needed.  Diagnosis:  E11.3299  Non insulin dependent.   metFORMIN (GLUCOPHAGE) 850 MG tablet TAKE 1 TABLET TWICE DAILY   Multiple Vitamin (MULTIVITAMIN) tablet Take 1 tablet by mouth daily. Centrum Silver   niacin (NIASPAN) 500 MG CR tablet TAKE 3 TABLETS AT BEDTIME   pioglitazone (ACTOS) 45 MG tablet TAKE 1 TABLET EVERY DAY   ramipril (ALTACE) 5 MG capsule TAKE 1 CAPSULE (5 MG TOTAL) BY MOUTH DAILY.   No facility-administered encounter medications on file as of 09/19/2020.    Recent Relevant Labs: Lab Results  Component Value Date/Time   HGBA1C 7.6 (A) 09/10/2020 08:17 AM   HGBA1C 8.1 (H) 02/23/2020 07:59 AM   HGBA1C 8.7 (A) 08/29/2019 08:13 AM   HGBA1C 7.4 (H) 02/22/2019 08:06 AM   MICROALBUR 8.6 (H) 10/16/2009 08:43 AM   MICROALBUR 5.3 (H) 08/22/2008 08:52 AM    Kidney Function Lab Results  Component Value Date/Time   CREATININE  1.34 02/23/2020 07:59 AM   CREATININE 1.11 02/22/2019 08:06 AM   CREATININE 1.2 01/28/2016 08:04 AM   CREATININE 1.0 01/29/2015 10:34 AM   GFR 53.25 (L) 02/23/2020 07:59 AM   GFRNONAA 92.28 10/16/2009 08:43 AM   GFRAA 111 08/24/2007 10:15 AM    Current antihyperglycemic regimen:  Actos 45 mg - 1 tablet daily Metformin 850 mg - 1 tablet twice daily with meals Glimepiride 2 mg - 1 tablet every day before breakfast    Patient verbally confirms he is taking the above medications as directed. Yes  What recent interventions/DTPs have been made to improve glycemic control: none identified  Have there been any recent hospitalizations or ED visits since last visit with CPP? No  Patient denies hypoglycemic symptoms, including Pale, Sweaty, Shaky, Hungry, Nervous/irritable, and Vision changes  Patient denies hyperglycemic symptoms, including blurry vision, excessive thirst, fatigue, polyuria, and weakness  How often are you checking your blood sugar?  The patient did confirm that he has a monitor to check BG's, he does not have any readings available but was reminded to take readings for future calls  On insulin? No During the week, how often does your blood glucose drop below 70? Never Are you checking your feet daily/regularly? Yes  Adherence Review: Is the patient currently on a STATIN medication? Yes Is the patient currently on ACE/ARB medication? Yes Does the patient have >5 day gap between last estimated fill dates?No  Star Rating Drugs:  Medication:  Last Fill: Day Supply Atorvastatin 48m 08/03/20 90 Glimepiride 257m4/15/22 90 Metformin 85077m/15/22 90 Actos 35m88m/15/22 90 Ramipril 5mg 20m15/22 90   Recent Office Vitals: BP Readings from Last 3 Encounters:  09/10/20 124/68  03/06/20 (!) 153/60  03/01/20 (!) 162/58   Pulse Readings from Last 3 Encounters:  09/10/20 69  03/06/20 (!) 59  03/01/20 (!) 58    Wt Readings from Last 3 Encounters:  09/10/20 275 lb (124.7  kg)  03/06/20 276 lb (125.2 kg)  03/01/20 277 lb 9 oz (125.9 kg)     Kidney Function Lab Results  Component Value Date/Time   CREATININE 1.34 02/23/2020 07:59 AM   CREATININE 1.11 02/22/2019 08:06 AM   CREATININE 1.2 01/28/2016 08:04 AM   CREATININE 1.0 01/29/2015 10:34 AM   GFR 53.25 (L) 02/23/2020 07:59 AM   GFRNONAA 92.28 10/16/2009 08:43 AM   GFRAA 111 08/24/2007 10:15 AM    BMP Latest Ref Rng & Units 02/23/2020 02/22/2019 02/02/2018  Glucose 70 - 99 mg/dL 140(H) 182(H) 148(H)  BUN 6 - 23 mg/dL 29(H) 21 23  Creatinine 0.40 - 1.50 mg/dL 1.34 1.11 1.17  Sodium 135 - 145 mEq/L 138 139 139  Potassium 3.5 - 5.1 mEq/L 4.8 4.3 4.6  Chloride 96 - 112 mEq/L 101 102 103  CO2 19 - 32 mEq/L '29 30 29  ' Calcium 8.4 - 10.5 mg/dL 9.0 9.0 9.5   Current antihypertensive regimen:  Ramipril 5 mg - 1 capsule daily   Patient verbally confirms he is taking the above medications as directed. Yes  How often are you checking your Blood Pressure? infrequently the patient does have a monitor for checking his BP  Current home BP readings: none available at this time Patient reminded to take some readings for future calls   Wrist or arm cuff:arm Caffeine intake: drinks regular coffee in the am  Salt intake: does not add to meals  OTC medications including pseudoephedrine or NSAIDs? no   What recent interventions/DTPs have been made by any provider to improve Blood Pressure control since last CPP Visit: none identified  Any recent hospitalizations or ED visits since last visit with CPP? No  What diet changes have been made to improve Blood Pressure Control?  The patient eats a healthy salad and one protein a day    What exercise is being done to improve your Blood Pressure Control? The patient stays very active and continues to be employed  Follow-Up:  Pharmacist Review  MicheDebbora Dus notified  VelmeAvel SensorA [place] [phone] reviewed the  care management and care coordination activities outlined in this encounter and I am certifying that I agree with the content of this note. No further action required.  MicheDebbora DusrmD Clinical Pharmacist [phone]

## 2020-10-08 DIAGNOSIS — M79671 Pain in right foot: Secondary | ICD-10-CM | POA: Diagnosis not present

## 2020-10-11 DIAGNOSIS — L570 Actinic keratosis: Secondary | ICD-10-CM | POA: Diagnosis not present

## 2020-10-11 DIAGNOSIS — X32XXXA Exposure to sunlight, initial encounter: Secondary | ICD-10-CM | POA: Diagnosis not present

## 2020-10-11 DIAGNOSIS — Z85828 Personal history of other malignant neoplasm of skin: Secondary | ICD-10-CM | POA: Diagnosis not present

## 2020-10-11 DIAGNOSIS — D2271 Melanocytic nevi of right lower limb, including hip: Secondary | ICD-10-CM | POA: Diagnosis not present

## 2020-10-11 DIAGNOSIS — D2261 Melanocytic nevi of right upper limb, including shoulder: Secondary | ICD-10-CM | POA: Diagnosis not present

## 2020-10-11 DIAGNOSIS — D2262 Melanocytic nevi of left upper limb, including shoulder: Secondary | ICD-10-CM | POA: Diagnosis not present

## 2020-10-11 DIAGNOSIS — D485 Neoplasm of uncertain behavior of skin: Secondary | ICD-10-CM | POA: Diagnosis not present

## 2020-10-11 DIAGNOSIS — L821 Other seborrheic keratosis: Secondary | ICD-10-CM | POA: Diagnosis not present

## 2020-10-11 DIAGNOSIS — D225 Melanocytic nevi of trunk: Secondary | ICD-10-CM | POA: Diagnosis not present

## 2020-10-15 DIAGNOSIS — M545 Low back pain, unspecified: Secondary | ICD-10-CM | POA: Diagnosis not present

## 2020-11-05 DIAGNOSIS — M5459 Other low back pain: Secondary | ICD-10-CM | POA: Diagnosis not present

## 2020-11-05 DIAGNOSIS — S39012D Strain of muscle, fascia and tendon of lower back, subsequent encounter: Secondary | ICD-10-CM | POA: Diagnosis not present

## 2020-11-27 ENCOUNTER — Telehealth: Payer: Self-pay

## 2020-11-27 NOTE — Chronic Care Management (AMB) (Addendum)
Chronic Care Management Pharmacy Assistant   Name: ZHAMIR PIRRO  MRN: [id number] DOB: 1950/09/13  Reason for Encounter: Diabetes  Recent office visits:  None since last CCM contact  Recent consult visits:  10/15/20 - Orthopedics - Patient presented for low back pain. Note unavailable. 10/11/20 - Dermatology - Patient presented for ear biopsy. Note unavailable. 10/08/20 - Orthopedics - Patient presented for right foot pain. Note unavailable.   Hospital visits:  None in previous 6 months  Medications: Outpatient Encounter Medications as of 11/27/2020  Medication Sig   ACCU-CHEK FASTCLIX LANCETS MISC Use to test blood sugar once daily or as needed.  Diagnosis:  E11.3299  Non insulin dependent.   aspirin 81 MG tablet Take 81 mg by mouth daily.   atorvastatin (LIPITOR) 10 MG tablet TAKE 1 TABLET (10 MG TOTAL) BY MOUTH AT BEDTIME.   Blood Glucose Monitoring Suppl (ACCU-CHEK NANO SMARTVIEW) w/Device KIT Use to check blood sugar once daily or as needed.  Diagnosis: E11.3299  Non insulin dependent.   glimepiride (AMARYL) 2 MG tablet TAKE 1 TABLET EVERY DAY BEFORE BREAKFAST   glucose blood (ACCU-CHEK SMARTVIEW) test strip Use as instructed to test blood sugar once daily or as needed.  Diagnosis:  E11.3299  Non insulin dependent.   metFORMIN (GLUCOPHAGE) 850 MG tablet TAKE 1 TABLET TWICE DAILY   Multiple Vitamin (MULTIVITAMIN) tablet Take 1 tablet by mouth daily. Centrum Silver   niacin (NIASPAN) 500 MG CR tablet TAKE 3 TABLETS AT BEDTIME   pioglitazone (ACTOS) 45 MG tablet TAKE 1 TABLET EVERY DAY   ramipril (ALTACE) 5 MG capsule TAKE 1 CAPSULE (5 MG TOTAL) BY MOUTH DAILY.   No facility-administered encounter medications on file as of 11/27/2020.    Recent Relevant Labs: Lab Results  Component Value Date/Time   HGBA1C 7.6 (A) 09/10/2020 08:17 AM   HGBA1C 8.1 (H) 02/23/2020 07:59 AM   HGBA1C 8.7 (A) 08/29/2019 08:13 AM   HGBA1C 7.4 (H) 02/22/2019 08:06 AM   MICROALBUR 8.6 (H) 10/16/2009  08:43 AM   MICROALBUR 5.3 (H) 08/22/2008 08:52 AM    Kidney Function Lab Results  Component Value Date/Time   CREATININE 1.34 02/23/2020 07:59 AM   CREATININE 1.11 02/22/2019 08:06 AM   CREATININE 1.2 01/28/2016 08:04 AM   CREATININE 1.0 01/29/2015 10:34 AM   GFR 53.25 (L) 02/23/2020 07:59 AM   GFRNONAA 92.28 10/16/2009 08:43 AM   GFRAA 111 08/24/2007 10:15 AM   Attempted contact with [place] Haggard D Ryser 3 times on 11/27/20, 11/29/20, and 12/06/20. Unsuccessful outreach.   Current antihyperglycemic regimen:  Actos 45 mg - 1 tablet daily Metformin 850 mg - 1 tablet twice daily with meals Glimepiride 2 mg - 1 tablet every day before breakfast  Care Gaps: Last annual wellness visit: 03/01/2020 Last eye exam / retinopathy screening: 10/04/2020 Last diabetic foot exam: 03/01/2020   Star Rating Drugs:  Medication:  Last Fill: Day Supply Atorvastatin 40m       08/03/20            90 Glimepiride 223m         08/03/20            90 Metformin 85048m      08/03/20            90 Actos 50m42m              08/03/20            90 Ramipril 5mg75m  08/03/20            90  Patient uses [employer] so refill history may not be accurate.  PCP appointment on 03/11/2021   [name], CPP notified  Avel Sensor, [place] Assistant [phone]  I have reviewed the care management and care coordination activities outlined in this encounter and I am certifying that I agree with the content of this note. Scheduled CCM for October (6 month follow up). No further action required.  [name], PharmD Clinical Pharmacist [facility] at [facility] [phone]

## 2021-01-07 DIAGNOSIS — H02831 Dermatochalasis of right upper eyelid: Secondary | ICD-10-CM | POA: Diagnosis not present

## 2021-01-07 DIAGNOSIS — Z961 Presence of intraocular lens: Secondary | ICD-10-CM | POA: Diagnosis not present

## 2021-01-07 DIAGNOSIS — E113513 Type 2 diabetes mellitus with proliferative diabetic retinopathy with macular edema, bilateral: Secondary | ICD-10-CM | POA: Diagnosis not present

## 2021-01-07 DIAGNOSIS — H02834 Dermatochalasis of left upper eyelid: Secondary | ICD-10-CM | POA: Diagnosis not present

## 2021-01-16 ENCOUNTER — Ambulatory Visit (INDEPENDENT_AMBULATORY_CARE_PROVIDER_SITE_OTHER): Payer: Medicare HMO | Admitting: Family Medicine

## 2021-01-16 ENCOUNTER — Telehealth: Payer: Self-pay | Admitting: Family Medicine

## 2021-01-16 ENCOUNTER — Other Ambulatory Visit: Payer: Self-pay

## 2021-01-16 ENCOUNTER — Encounter: Payer: Self-pay | Admitting: Family Medicine

## 2021-01-16 VITALS — BP 158/62 | HR 60 | Temp 98.0°F | Ht 72.0 in | Wt 276.2 lb

## 2021-01-16 DIAGNOSIS — L97921 Non-pressure chronic ulcer of unspecified part of left lower leg limited to breakdown of skin: Secondary | ICD-10-CM | POA: Diagnosis not present

## 2021-01-16 DIAGNOSIS — Z8781 Personal history of (healed) traumatic fracture: Secondary | ICD-10-CM | POA: Diagnosis not present

## 2021-01-16 DIAGNOSIS — E113299 Type 2 diabetes mellitus with mild nonproliferative diabetic retinopathy without macular edema, unspecified eye: Secondary | ICD-10-CM | POA: Diagnosis not present

## 2021-01-16 DIAGNOSIS — I10 Essential (primary) hypertension: Secondary | ICD-10-CM

## 2021-01-16 DIAGNOSIS — E11622 Type 2 diabetes mellitus with other skin ulcer: Secondary | ICD-10-CM

## 2021-01-16 DIAGNOSIS — Z9889 Other specified postprocedural states: Secondary | ICD-10-CM | POA: Diagnosis not present

## 2021-01-16 DIAGNOSIS — L98499 Non-pressure chronic ulcer of skin of other sites with unspecified severity: Secondary | ICD-10-CM

## 2021-01-16 MED ORDER — HYDROCHLOROTHIAZIDE 12.5 MG PO TABS: 12.5000 mg | ORAL_TABLET | Freq: Every day | ORAL | 1 refills | Status: DC

## 2021-01-16 NOTE — Telephone Encounter (Signed)
Please check on patient on Friday AM about his leg and about swelling.  Let me know when he is going to see the wound clinic.  Thanks.

## 2021-01-16 NOTE — Progress Notes (Addendum)
Dennis Canterbury T. Voula Waln, MD, [place] at [facility] [place], [address]  Phone: [phone]  FAX: [place] - 70 y.o. male  MRN [id number]  Date of Birth: [birth year]  Date: 01/16/2021  PCP: [name], MD  Referral: [name], MD  Chief Complaint  Patient presents with   Leg Swelling    This visit occurred during the SARS-CoV-2 public health emergency.  Safety protocols were in place, including screening questions prior to the visit, additional usage of staff PPE, and extensive cleaning of exam room while observing appropriate contact time as indicated for disinfecting solutions.   Subjective:   Dennis Wise is a 70 y.o. very pleasant male patient with Body mass index is 37.47 kg/m. who presents with the following:  This is a pleasant young gentleman, but he also has a history of a very complex tibial intra-articular fracture on the left side requiring operative fixation ultimately followed by Dr. Latanya Maudlin in 2012.  I am having a difficult time finding any operative reports, and the chart also notes left knee fracture repair with ORIF also done in 2004, and unable to find these operative reports either.  He was on a trip, and he did strike his legs. Came back on 12/31/2020. Both legs were swollen. He has had some swelling in both legs, it is a little bit worse on the left. Around the same time  He does not have any fever or otherwise systemic symptoms. He is eating and drinking normally, and otherwise is feeling okay.  BP Readings from Last 3 Encounters:  01/16/21 (!) 158/62  09/10/20 124/68  03/06/20 (!) 153/60     Review of Systems is noted in the HPI, as appropriate   Objective:   BP (!) 158/62   Pulse 60   Temp 98 F (36.7 C) (Temporal)   Ht 6' (1.829 m)   Wt 276 lb 4 oz (125.3 kg)   SpO2 94%   BMI 37.47 kg/m   Left leg       Right leg     Radiology: No  results found.  Assessment and Plan:     ICD-10-CM   1. Diabetic skin ulcer associated with type 2 diabetes mellitus ([place])  N40.768 AMB referral to wound care center   L98.499     2. Leg ulcer, left, limited to breakdown of skin ([place])  L97.921 AMB referral to wound care center    WOUND CULTURE    3. DM type 2 with diabetic background retinopathy ([place])  E11.3299 AMB referral to wound care center    4. S/P ORIF (open reduction internal fixation) fracture  Z98.890 AMB referral to wound care center   Z87.81     5. Essential hypertension  I10      Multiple open wounds on the left lower extremity that has an exceptionally complex trauma history with multiple forms of hardware in the left tibia.  This is a very complicated wound in the setting of hardware and with diabetes.  I am going to asked the wound center to help address ASAP.  This is a high risk case.  He also is having some swelling, and his blood pressure is up today.  He has not been on a thiazide diuretic, so I am going to start him on some hydrochlorothiazide.  I asked him also to follow-up with Dr. Damita Dunnings in 3 weeks.  Addendum: 01/21/21 11:59 AM  Results  for orders placed or performed in visit on 01/16/21  WOUND CULTURE   Specimen: Wound  Result Value Ref Range   MICRO NUMBER: [id number]    SPECIMEN QUALITY: Adequate    SOURCE: NOT GIVEN    STATUS: FINAL    GRAM STAIN:      No epithelial cells seen Few Polymorphonuclear leukocytes Many Gram positive cocci   ISOLATE 1: Staphylococcus aureus (A)    COMMENT:      No source was provided. The specimen was tested and reported based upon the test code ordered. If this is incorrect, please contact client services.      Susceptibility   Staphylococcus aureus - AEROBIC CULT, GRAM STAIN POSITIVE 1    VANCOMYCIN <=0.5 Sensitive     CIPROFLOXACIN <=0.5 Sensitive     CLINDAMYCIN <=0.25 Sensitive     LEVOFLOXACIN 0.25 Sensitive     ERYTHROMYCIN <=0.25 Sensitive     GENTAMICIN  <=0.5 Sensitive     OXACILLIN* 0.5 Sensitive      * Oxacillin susceptible staphylococci are susceptible to other penicillinase-stable penicillins (e.g., methicillin, nafcillin), beta- lactam/beta-lactamase inhibitor combinations, and cephems with staphylococcal indications, including cefazolin.     TETRACYCLINE <=1 Sensitive     TRIMETH/SULFA* <=10 Sensitive      * Oxacillin susceptible staphylococci are susceptible to other penicillinase-stable penicillins (e.g., methicillin, nafcillin), beta- lactam/beta-lactamase inhibitor combinations, and cephems with staphylococcal indications, including cefazolin. Legend: S = Susceptible  I = Intermediate R = Resistant  NS = Not susceptible * = Not tested  NR = Not reported **NN = See antimicrobic comments     With + methicillin sensitive Staph on culture, I want to start him on ABX. I called and left messages on his home and cell phones.  I sent in Doxy 100 mg po bid, #20.  Meds ordered this encounter  Medications   hydrochlorothiazide (HYDRODIURIL) 12.5 MG tablet    Sig: Take 1 tablet (12.5 mg total) by mouth daily.    Dispense:  30 tablet    Refill:  1   doxycycline (VIBRA-TABS) 100 MG tablet    Sig: Take 1 tablet (100 mg total) by mouth 2 (two) times daily.    Dispense:  20 tablet    Refill:  0   There are no discontinued medications. Orders Placed This Encounter  Procedures   WOUND CULTURE   AMB referral to wound care center    Follow-up: Return in about 3 weeks (around 02/06/2021) for Dr. Damita Dunnings.  Dr[employer] One speech-to-text software was used for transcription in this dictation.  Possible transcriptional errors can occur using Editor, commissioning.   Signed,  Maud Deed. Khaidyn Staebell, MD   Outpatient Encounter Medications as of 01/16/2021  Medication Sig   ACCU-CHEK FASTCLIX LANCETS MISC Use to test blood sugar once daily or as needed.  Diagnosis:  E11.3299  Non insulin dependent.   aspirin 81 MG tablet Take 81 mg by  mouth daily.   atorvastatin (LIPITOR) 10 MG tablet TAKE 1 TABLET (10 MG TOTAL) BY MOUTH AT BEDTIME.   Blood Glucose Monitoring Suppl (ACCU-CHEK NANO SMARTVIEW) w/Device KIT Use to check blood sugar once daily or as needed.  Diagnosis: E11.3299  Non insulin dependent.   doxycycline (VIBRA-TABS) 100 MG tablet Take 1 tablet (100 mg total) by mouth 2 (two) times daily.   glimepiride (AMARYL) 2 MG tablet TAKE 1 TABLET EVERY DAY BEFORE BREAKFAST   glucose blood (ACCU-CHEK SMARTVIEW) test strip Use as instructed to test blood sugar once daily or  as needed.  Diagnosis:  E11.3299  Non insulin dependent.   hydrochlorothiazide (HYDRODIURIL) 12.5 MG tablet Take 1 tablet (12.5 mg total) by mouth daily.   metFORMIN (GLUCOPHAGE) 850 MG tablet TAKE 1 TABLET TWICE DAILY   Multiple Vitamin (MULTIVITAMIN) tablet Take 1 tablet by mouth daily. Centrum Silver   niacin (NIASPAN) 500 MG CR tablet TAKE 3 TABLETS AT BEDTIME   pioglitazone (ACTOS) 45 MG tablet TAKE 1 TABLET EVERY DAY   ramipril (ALTACE) 5 MG capsule TAKE 1 CAPSULE (5 MG TOTAL) BY MOUTH DAILY.   No facility-administered encounter medications on file as of 01/16/2021.

## 2021-01-18 NOTE — Telephone Encounter (Signed)
Patient states his swelling has gone down and looking better. He goes to the wound clinic on 01/28/21.

## 2021-01-19 LAB — WOUND CULTURE
MICRO NUMBER:: 12434462
SPECIMEN QUALITY:: ADEQUATE

## 2021-01-20 ENCOUNTER — Other Ambulatory Visit: Payer: Self-pay | Admitting: Family Medicine

## 2021-01-20 DIAGNOSIS — E113299 Type 2 diabetes mellitus with mild nonproliferative diabetic retinopathy without macular edema, unspecified eye: Secondary | ICD-10-CM

## 2021-01-20 DIAGNOSIS — D61818 Other pancytopenia: Secondary | ICD-10-CM

## 2021-01-20 NOTE — Telephone Encounter (Signed)
Noted.  Thanks.  I appreciate the help of all involved.   

## 2021-01-21 ENCOUNTER — Other Ambulatory Visit: Payer: Self-pay

## 2021-01-21 ENCOUNTER — Ambulatory Visit (INDEPENDENT_AMBULATORY_CARE_PROVIDER_SITE_OTHER): Payer: Medicare HMO

## 2021-01-21 DIAGNOSIS — E113299 Type 2 diabetes mellitus with mild nonproliferative diabetic retinopathy without macular edema, unspecified eye: Secondary | ICD-10-CM

## 2021-01-21 DIAGNOSIS — I10 Essential (primary) hypertension: Secondary | ICD-10-CM

## 2021-01-21 DIAGNOSIS — E7800 Pure hypercholesterolemia, unspecified: Secondary | ICD-10-CM

## 2021-01-21 MED ORDER — DOXYCYCLINE HYCLATE 100 MG PO TABS
100.0000 mg | ORAL_TABLET | Freq: Two times a day (BID) | ORAL | 0 refills | Status: DC
Start: 2021-01-21 — End: 2021-03-11

## 2021-01-21 NOTE — Progress Notes (Signed)
Chronic Care Management Pharmacy Note  01/21/2021 Name:  Dennis Wise MRN:  [id number] DOB:  22-Jan-1951  Summary: Patient limited on time today due to wife's surgery. DM controlled, fasting BG 80-90s no hypoglycemia reported. LDL 55, adherent to statin. Also on niacin. BP elevated in clinic and no home readings to report today. No adherence or cost concerns identified.  Recommendations: Check home BP weekly. Bring records to office visits. Would like to discuss d/c niacin and switch glimepiride to SGLT-2. Pt open to call later this week to discuss.  Plan:  -CCM follow up call 01/24/21 -PCP AWV 03/12/21.  Subjective: Dennis Wise is an 70 y.o. year old male who is a primary patient of Damita Dunnings, Elveria Rising, MD.  The CCM team was consulted for assistance with disease management and care coordination needs.    Engaged with patient by telephone for follow up visit in response to provider referral for pharmacy case management and/or care coordination services.   Consent to Services:  The patient was given information about Chronic Care Management services, agreed to services, and gave verbal consent prior to initiation of services.  Please see initial visit note for detailed documentation.   Patient Care Team: [name], MD as PCP - General (Family Medicine) Gearlean Alf, MD as Referring Physician (Ophthalmology) Nicholas Lose, MD as Consulting Physician (Hematology and Oncology) [name], [facility] as Pharmacist (Pharmacist)  Recent office visits: 09/10/20 - PCP - Pt presented for DM follow up. A1c improved. No medication changes. Referral to ortho. Follow up 6 months.  Recent consult visits: 06/11/20 - Diabetes eye exam, PDR with diabetic macular edema both eyes. RTC 12 weeks.  02/15/20 - Diabetic eye exam   Hospital visits: None in previous 6 months  Objective:  Lab Results  Component Value Date   CREATININE 1.34 02/23/2020   BUN 29 (H) 02/23/2020   GFR 53.25 (L)  02/23/2020   GFRNONAA 92.28 10/16/2009   GFRAA 111 08/24/2007   NA 138 02/23/2020   K 4.8 02/23/2020   CALCIUM 9.0 02/23/2020   CO2 29 02/23/2020   Lab Results  Component Value Date/Time   HGBA1C 7.6 (A) 09/10/2020 08:17 AM   HGBA1C 8.1 (H) 02/23/2020 07:59 AM   HGBA1C 8.7 (A) 08/29/2019 08:13 AM   HGBA1C 7.4 (H) 02/22/2019 08:06 AM   GFR 53.25 (L) 02/23/2020 07:59 AM   GFR 65.37 02/22/2019 08:06 AM   MICROALBUR 8.6 (H) 10/16/2009 08:43 AM   MICROALBUR 5.3 (H) 08/22/2008 08:52 AM    Last diabetic Eye exam: 06/11/20 retinopathy both eyes Last diabetic Foot exam: 03/01/20 PCP normal  Lab Results  Component Value Date   CHOL 141 02/23/2020   HDL 72.70 02/23/2020   LDLCALC 55 02/23/2020   LDLDIRECT 74.0 10/16/2015   TRIG 71.0 02/23/2020   CHOLHDL 2 02/23/2020    Hepatic Function Latest Ref Rng & Units 02/23/2020 02/23/2020 02/22/2019  Total Protein 6.1 - 8.1 g/dL 6.2 6.1 6.4  Albumin 3.5 - 5.2 g/dL - 4.0 -  AST 0 - 37 U/L - 18 -  ALT 0 - 53 U/L - 18 -  Alk Phosphatase 39 - 117 U/L - 96 -  Total Bilirubin 0.2 - 1.2 mg/dL - 0.7 -  Bilirubin, Direct 0.0 - 0.3 mg/dL - - -    Lab Results  Component Value Date/Time   TSH 2.33 10/16/2009 08:43 AM   TSH 2.24 08/22/2008 08:52 AM    CBC Latest Ref Rng & Units 02/23/2020 02/22/2019 02/02/2018  WBC 4.0 - 10.5 K/uL 4.0 3.6(L) 4.0  Hemoglobin 13.0 - 17.0 g/dL 12.0(L) 11.6(L) 12.5(L)  Hematocrit 39.0 - 52.0 % 34.5(L) 34.5(L) 36.6(L)  Platelets 150.0 - 400.0 K/uL 95.0(L) 106.0(L) 103.0(L)    No results found for: VD25OH  Clinical ASCVD: No  The 10-year ASCVD risk score (Arnett DK, et al., 2019) is: 42.5%   Values used to calculate the score:     Age: 20 years     Sex: Male     Is Non-Hispanic African American: No     Diabetic: Yes     Tobacco smoker: No     Systolic Blood Pressure: 147 mmHg     Is BP treated: Yes     HDL Cholesterol: 72.7 mg/dL     Total Cholesterol: 141 mg/dL    Depression screen [facility] 2/9 03/01/2020  02/21/2019 02/09/2018  Decreased Interest 0 0 0  Down, Depressed, Hopeless 0 0 0  PHQ - 2 Score 0 0 0  Altered sleeping - 0 -  Tired, decreased energy - 0 -  Change in appetite - 0 -  Feeling bad or failure about yourself  - 0 -  Trouble concentrating - 0 -  Moving slowly or fidgety/restless - 0 -  Suicidal thoughts - 0 -  PHQ-9 Score - 0 -  Difficult doing work/chores - Not difficult at all -    Social History   Tobacco Use  Smoking Status Never  Smokeless Tobacco Never   BP Readings from Last 3 Encounters:  01/16/21 (!) 158/62  09/10/20 124/68  03/06/20 (!) 153/60   Pulse Readings from Last 3 Encounters:  01/16/21 60  09/10/20 69  03/06/20 (!) 59   Wt Readings from Last 3 Encounters:  01/16/21 276 lb 4 oz (125.3 kg)  09/10/20 275 lb (124.7 kg)  03/06/20 276 lb (125.2 kg)    Assessment/Interventions: Review of patient past medical history, allergies, medications, health status, including review of consultants reports, laboratory and other test data, was performed as part of comprehensive evaluation and provision of chronic care management services.   SDOH:  (Social Determinants of Health) assessments and interventions performed: Yes SDOH Interventions    Flowsheet Row Most Recent Value  SDOH Interventions   Financial Strain Interventions Intervention Not Indicated        CCM Care Plan  Allergies  Allergen Reactions   Promethazine Hcl     REACTION: AGITIATION   Other Anxiety    phenerol made patient have anxiety    Medications Reviewed Today     Reviewed by [name], [facility] (Pharmacist) on 01/21/21 at [phone]  Med List Status: <None>   Medication Order Taking? Sig Documenting Provider Last Dose Status Informant  ACCU-CHEK FASTCLIX LANCETS [place] [id number] Yes Use to test blood sugar once daily or as needed.  Diagnosis:  E11.3299  Non insulin dependent. [name], MD Taking Active   aspirin 81 MG tablet [id number] Yes Take 81 mg by mouth daily.  [provider] Taking Active   atorvastatin (LIPITOR) 10 MG tablet [id number] Yes TAKE 1 TABLET (10 MG TOTAL) BY MOUTH AT BEDTIME. [name], MD Taking Active   Blood Glucose Monitoring Suppl (ACCU-CHEK NANO SMARTVIEW) w/Device KIT [id number]  Use to check blood sugar once daily or as needed.  Diagnosis: E11.3299  Non insulin dependent. [name], MD  Active   glimepiride (AMARYL) 2 MG tablet [id number] Yes TAKE 1 TABLET EVERY DAY BEFORE BREAKFAST [name], MD Taking Active   glucose  blood (ACCU-CHEK SMARTVIEW) test strip [id number] Yes Use as instructed to test blood sugar once daily or as needed.  Diagnosis:  E11.3299  Non insulin dependent. [name], MD Taking Active   hydrochlorothiazide (HYDRODIURIL) 12.5 MG tablet [id number] Yes Take 1 tablet (12.5 mg total) by mouth daily. Owens Loffler, MD Taking Active   metFORMIN (GLUCOPHAGE) 850 MG tablet [id number] Yes TAKE 1 TABLET TWICE DAILY [name], MD Taking Active   Multiple Vitamin (MULTIVITAMIN) tablet [id number] Yes Take 1 tablet by mouth daily. Centrum Silver [provider] Taking Active   niacin (NIASPAN) 500 MG CR tablet [id number] Yes TAKE 3 TABLETS AT BEDTIME [name], MD Taking Active   pioglitazone (ACTOS) 45 MG tablet [id number] Yes TAKE 1 TABLET EVERY DAY [name], MD Taking Active   ramipril (ALTACE) 5 MG capsule [id number] Yes TAKE 1 CAPSULE (5 MG TOTAL) BY MOUTH DAILY. [name], MD Taking Active             Patient Active Problem List   Diagnosis Date Noted   Unstable ankle 09/12/2020   Back pain 09/12/2020   Dupuytren contracture 03/03/2019   Healthcare maintenance 02/03/2017   Hearing loss 10/20/2014   Obesity (BMI 30-39.9) 10/16/2013   Advance care planning 10/16/2013   Actinic keratosis 10/16/2013   Pancytopenia, acquired ([place]) 05/01/2011   Medicare annual wellness visit, subsequent 10/24/2010   FOOT PAIN, RIGHT 01/08/2010   Diabetic  retinopathy ([place]) 03/21/2005   Essential hypertension 11/20/1999   GILBERT'S SYNDROME,  6/99 09/19/1997   GERD 01/19/1989   DM type 2 with diabetic background retinopathy ([place]) 04/21/1988   HYPERCHOLESTEROLEMIA  (353,TRIG1980) 1990 04/21/1988    Immunization History  Administered Date(s) Administered   Fluad Quad(high Dose 65+) 02/28/2019, 03/01/2020   Influenza Split 03/03/2011, 02/03/2012   Influenza,inj,Quad PF,6+ Mos 02/28/2013, 02/20/2015, 05/12/2016, 01/30/2017, 02/09/2018   PFIZER(Purple Top)SARS-COV-2 Vaccination 06/20/2019, 07/11/2019, 03/26/2020   Pneumococcal Conjugate-13 10/14/2013   Pneumococcal Polysaccharide-23 08/22/1998, 03/03/2011, 01/30/2017   Td 08/22/1998, 10/16/2009   Zoster, Live 02/28/2013    Conditions to be addressed/monitored:  Hypertension, Hyperlipidemia and Diabetes  Care Plan : [place]  Updates made by [name], [place] since 01/21/2021 12:00 AM     Problem: CHL AMB "PATIENT-SPECIFIC PROBLEM"      Long-Range Goal: Disease Management   Start Date: 06/21/2020  This Visit's Progress: On track  Priority: High  Note:    Current Barriers:  Office BP elevated  Pharmacist Clinical Goal(s):  Patient will achieve adherence to monitoring guidelines and medication adherence to achieve therapeutic efficacy through collaboration with PharmD and provider.   Interventions: 1:1 collaboration with [name], MD regarding development and update of comprehensive plan of care as evidenced by provider attestation and co-signature Inter-disciplinary care team collaboration (see longitudinal plan of care) Comprehensive medication review performed; medication list updated in electronic medical record  Hypertension (BP goal <140/90) -Not ideally controlled - elevated BP in clinic, pt previously reports white coat syndrome, does not have any home readings today, but states he will start checking at home. -Current treatment: Ramipril 5 mg - 1  capsule daily  -Medications previously tried: none   -Current home readings: None, but will start checking. Reports he has an automatic BP monitor with arm cuff.    -Current exercise habits: does physical work daily, walking  -Denies hypotensive/hypertensive symptoms including dizziness and falls -Counseled to monitor BP at home several days, document, and provide log at future appointments -Recommended to continue  current medication; Pt will start checking BP for at home  with goal < 140/90.  Limit salt intake.  Hyperlipidemia: (LDL goal < 70) -Controlled - LDL 55, no gaps in adherence -Current treatment: Atorvastatin 10 mg - 1 tablet daily Niacin 500 mg - 3 tablets at bedtime  Aspirin 81 mg - 1 tablet daily  -Medications previously tried: none -Patient did not have time to discuss deprescribing today.  -Recommended to continue current medication; Patient would like to schedule follow up to discuss streamlining medications - Will consider niacin d/c due to ADRs and limited benefit.  Diabetes (A1c goal < 7%) -Controlled - A1c 7.6%, Fasting BG is 80-90s on daily checks. None < 70. -Current medications: Actos 45 mg - 1 tablet daily Metformin 850 mg - 1 tablet twice daily with meals Glimepiride 2 mg - 1 tablet every day before breakfast -Medications previously tried: none  -Current home glucose readings - checks every morning  fasting glucose: pt reports BG 80s-90s for the past few months, 104 this morning due to sweet potato last night post prandial glucose: none Denies BG < 70 at all -Denies hypoglycemic/hyperglycemic symptoms -Current meal patterns: eats a lot of protein and salads, limits carbs  snacks: fruit, apples or bananas, salty peanuts drinks: no sugary drinks, water 2-3 quarts daily Gets catered meals through work which are not necessarily diabetic friendly -He is open to any new oral medications. Prefers to avoid needles. Discussed cost - [employer]. No cost concerns  at this time. -Dr. Damita Dunnings does foot exam at every physical.  -Complications - diabetic retinopathy, sees eye doctor every 3 months -Recommended to continue current medication; Pt limited on time today - Would like to consider stopping glimepiride and starting SGLT-2 for renal protection. He did not have time to discuss today.  Denies any additional OTCs - Uses Tylenol on occasion for pain - May take a couple Motrin rarely   Patient Goals/Self-Care Activities Patient will:  - check glucose daily, document, and provide at future appointments - check blood pressure once weekly, document, and provide at future appointments  Follow Up Plan:  - CCM follow up - Patient requested call later this week to discuss med changes  (01/24/21 - 9:30 AM) - PCP follow up - AWV Nov 2022      Star Rating Drugs:  Medication:                Last Fill:         Day Supply Atorvastatin 59m       11/20/20            90 Glimepiride 240m         11/20/20            90 Metformin 85050m      11/20/20            90 Actos 36m57m              11/20/20            90 Ramipril 5mg 70m           11/20/20            90  No gaps in adherence  Medication Assistance: None required.  Patient affirms current coverage meets needs.  No cost concerns 01/21/21.  Patient's preferred pharmacy is:  [facility] [address]  [place] [address] Phone: [phone] Fax: [phone]  [id number] [place] [address]: [phone]: [phone] For urgent needs - [facility] Denies any concerns with [employer] pill box? Yes Pt endorses 100% compliance; Takes pills from organizer either at breakfast (2) or bedtime (all others).   We discussed: Current pharmacy is preferred with insurance plan and patient is satisfied with pharmacy services Patient decided to: Continue current medication  management strategy  Care Plan and Follow Up Patient Decision:  Patient agrees to Care Plan and Follow-up.   MDebbora Dus PharmD Clinical Pharmacist [phone]

## 2021-01-21 NOTE — Addendum Note (Signed)
Addended by: Owens Loffler on: 01/21/2021 11:59 AM   Modules accepted: Orders

## 2021-01-21 NOTE — Patient Instructions (Signed)
January 21, 2021  Dear Dennis Wise,  It was a pleasure meeting you during our initial appointment on January 21, 2021. Below is a summary of the goals we discussed and components of chronic care management. Please contact me anytime with questions or concerns.   Visit Information  Patient Care Plan: CCM Pharmacy Care Plan     Problem Identified: CHL AMB "PATIENT-SPECIFIC PROBLEM"      Long-Range Goal: Disease Management   Start Date: 06/21/2020  This Visit's Progress: On track  Priority: High  Note:    Current Barriers:  Office BP elevated  Pharmacist Clinical Goal(s):  Patient will achieve adherence to monitoring guidelines and medication adherence to achieve therapeutic efficacy through collaboration with PharmD and provider.   Interventions: 1:1 collaboration with [name], MD regarding development and update of comprehensive plan of care as evidenced by provider attestation and co-signature Inter-disciplinary care team collaboration (see longitudinal plan of care) Comprehensive medication review performed; medication list updated in electronic medical record  Hypertension (BP goal <140/90) -Not ideally controlled - elevated BP in clinic, pt previously reports white coat syndrome, does not have any home readings today, but states he will start checking at home. -Current treatment: Ramipril 5 mg - 1 capsule daily  -Medications previously tried: none   -Current home readings: None, but will start checking. Reports he has an automatic BP monitor with arm cuff.    -Current exercise habits: does physical work daily, walking  -Denies hypotensive/hypertensive symptoms including dizziness and falls -Counseled to monitor BP at home several days, document, and provide log at future appointments -Recommended to continue current medication; Pt will start checking BP for at home  with goal < 140/90.  Limit salt intake.  Hyperlipidemia: (LDL goal < 70) -Controlled - LDL 55, no  gaps in adherence -Current treatment: Atorvastatin 10 mg - 1 tablet daily Niacin 500 mg - 3 tablets at bedtime  Aspirin 81 mg - 1 tablet daily  -Medications previously tried: none -Patient did not have time to discuss deprescribing today.  -Recommended to continue current medication; Patient would like to schedule follow up to discuss streamlining medications - Will consider niacin d/c due to ADRs and limited benefit.  Diabetes (A1c goal < 7%) -Controlled - A1c 7.6%, Fasting BG is 80-90s on daily checks. None < 70. -Current medications: Actos 45 mg - 1 tablet daily Metformin 850 mg - 1 tablet twice daily with meals Glimepiride 2 mg - 1 tablet every day before breakfast -Medications previously tried: none  -Current home glucose readings - checks every morning  fasting glucose: pt reports BG 80s-90s for the past few months, 104 this morning due to sweet potato last night post prandial glucose: none Denies BG < 70 at all -Denies hypoglycemic/hyperglycemic symptoms -Current meal patterns: eats a lot of protein and salads, limits carbs  snacks: fruit, apples or bananas, salty peanuts drinks: no sugary drinks, water 2-3 quarts daily Gets catered meals through work which are not necessarily diabetic friendly -He is open to any new oral medications. Prefers to avoid needles. Discussed cost - [employer]. No cost concerns at this time. -Dr. Damita Dunnings does foot exam at every physical.  -Complications - diabetic retinopathy, sees eye doctor every 3 months -Recommended to continue current medication; Pt limited on time today - Would like to consider stopping glimepiride and starting SGLT-2 for renal protection. He did not have time to discuss today.  Denies any additional OTCs - Uses Tylenol on occasion for pain -  May take a couple Motrin rarely   Patient Goals/Self-Care Activities Patient will:  - check glucose daily, document, and provide at future appointments - check blood pressure  once weekly, document, and provide at future appointments  Follow Up Plan:  - CCM follow up - Patient requested call later this week to discuss med changes  (01/24/21 - 9:30 AM) - PCP follow up - AWV Nov 2022      Patient verbalizes understanding of instructions provided today and agrees to view in [place].   [name], PharmD Clinical Pharmacist [facility] at [facility] [phone]

## 2021-01-24 ENCOUNTER — Telehealth: Payer: Medicare HMO

## 2021-01-28 ENCOUNTER — Encounter: Payer: Medicare HMO | Attending: Internal Medicine | Admitting: Internal Medicine

## 2021-01-28 ENCOUNTER — Other Ambulatory Visit: Payer: Self-pay

## 2021-01-28 DIAGNOSIS — L97828 Non-pressure chronic ulcer of other part of left lower leg with other specified severity: Secondary | ICD-10-CM | POA: Insufficient documentation

## 2021-01-28 DIAGNOSIS — I87332 Chronic venous hypertension (idiopathic) with ulcer and inflammation of left lower extremity: Secondary | ICD-10-CM | POA: Insufficient documentation

## 2021-01-28 DIAGNOSIS — E11622 Type 2 diabetes mellitus with other skin ulcer: Secondary | ICD-10-CM | POA: Insufficient documentation

## 2021-01-31 NOTE — Progress Notes (Signed)
CORDIE, BUENING ([id number]) Visit Report for 01/28/2021 Allergy List Details Patient Name: Dennis Wise, Dennis Wise. Date of Service: 01/28/2021 8:45 AM Medical Record Number: [id number] Patient Account Number: [id number] Date of Birth/Sex: 1950/05/10 (70 y.o. Male) Treating RN: Carlene Coria Primary Care Janaki Exley: Elsie Stain Other Clinician: Referring Jay Haskew: Owens Loffler Treating Yosef Krogh/Extender: Ricard Dillon Weeks in Treatment: 0 Allergies Active Allergies promethazine Allergy Notes Electronic Signature(s) Signed: 01/31/2021 1:01:44 PM By: Carlene Coria RN Entered By: Carlene Coria on 01/28/2021 08:57:32 Pflieger, Azure D. ([id number]) -------------------------------------------------------------------------------- Arrival Information Details Patient Name: Dennis Wise, Dennis D. Date of Service: 01/28/2021 8:45 AM Medical Record Number: [id number] Patient Account Number: [id number] Date of Birth/Sex: 1950-11-23 (70 y.o. Male) Treating RN: Carlene Coria Primary Care Rhyse Skowron: Elsie Stain Other Clinician: Referring Sherena Machorro: Owens Loffler Treating Khiara Shuping/Extender: Tito Dine in Treatment: 0 Visit Information Patient Arrived: Ambulatory Arrival Time: 08:55 Accompanied By: self Transfer Assistance: None Patient Identification Verified: Yes Secondary Verification Process Completed: Yes Patient Requires Transmission-Based Precautions: No Patient Has Alerts: No Electronic Signature(s) Signed: 01/31/2021 1:01:44 PM By: Carlene Coria RN Entered By: Carlene Coria on 01/28/2021 08:55:42 Waymire, Brek D. ([id number]) -------------------------------------------------------------------------------- Clinic Level of Care Assessment Details Patient Name: Dennis Wise, Dennis D. Date of Service: 01/28/2021 8:45 AM Medical Record Number: [id number] Patient Account Number: [id number] Date of Birth/Sex: 1951/03/10 (70 y.o. Male) Treating RN: Carlene Coria Primary Care Jozsef Wescoat: Elsie Stain  Other Clinician: Referring Giulietta Prokop: Owens Loffler Treating Sevon Rotert/Extender: Tito Dine in Treatment: 0 Clinic Level of Care Assessment Items TOOL 1 Quantity Score X - Use when EandM and Procedure is performed on INITIAL visit 1 0 ASSESSMENTS - Nursing Assessment / Reassessment X - General Physical Exam (combine w/ comprehensive assessment (listed just below) when performed on new 1 20 pt. evals) X- 1 25 Comprehensive Assessment (HX, ROS, Risk Assessments, Wounds Hx, etc.) ASSESSMENTS - Wound and Skin Assessment / Reassessment []  - Dermatologic / Skin Assessment (not related to wound area) 0 ASSESSMENTS - Ostomy and/or Continence Assessment and Care []  - Incontinence Assessment and Management 0 []  - 0 Ostomy Care Assessment and Management (repouching, etc.) PROCESS - Coordination of Care X - Simple Patient / Family Education for ongoing care 1 15 []  - 0 Complex (extensive) Patient / Family Education for ongoing care X- 1 10 Staff obtains Programmer, systems, Records, Test Results / Process Orders []  - 0 Staff telephones HHA, Nursing Homes / Clarify orders / etc []  - 0 Routine Transfer to another Facility (non-emergent condition) []  - 0 Routine Hospital Admission (non-emergent condition) X- 1 15 New Admissions / Biomedical engineer / Ordering NPWT, Apligraf, etc. []  - 0 Emergency Hospital Admission (emergent condition) PROCESS - Special Needs []  - Pediatric / Minor Patient Management 0 []  - 0 Isolation Patient Management []  - 0 Hearing / Language / Visual special needs []  - 0 Assessment of Community assistance (transportation, D/C planning, etc.) []  - 0 Additional assistance / Altered mentation []  - 0 Support Surface(s) Assessment (bed, cushion, seat, etc.) INTERVENTIONS - Miscellaneous []  - External ear exam 0 []  - 0 Patient Transfer (multiple staff / Civil Service fast streamer / Similar devices) []  - 0 Simple Staple / Suture removal (25 or less) []  - 0 Complex  Staple / Suture removal (26 or more) []  - 0 Hypo/Hyperglycemic Management (do not check if billed separately) X- 1 15 Ankle / Brachial Index (ABI) - do not check if billed separately Has the patient been seen at the hospital within the last three years: Yes Total Score: 100 Level Of  Care: New/Established - Level 3 Worrall, Loretta D. ([id number]) Electronic Signature(s) Signed: 01/31/2021 1:01:44 PM By: Carlene Coria RN Entered By: Carlene Coria on 01/28/2021 09:22:23 Naclerio, Tarell D. ([id number]) -------------------------------------------------------------------------------- Encounter Discharge Information Details Patient Name: Dennis Wise, Dennis D. Date of Service: 01/28/2021 8:45 AM Medical Record Number: [id number] Patient Account Number: [id number] Date of Birth/Sex: 24-Nov-1950 (70 y.o. Male) Treating RN: Carlene Coria Primary Care Sewell Pitner: Elsie Stain Other Clinician: Referring Kynan Peasley: Owens Loffler Treating Zyrus Hetland/Extender: Tito Dine in Treatment: 0 Encounter Discharge Information Items Discharge Condition: Stable Ambulatory Status: Ambulatory Discharge Destination: Home Transportation: Private Auto Accompanied By: self Schedule Follow-up Appointment: Yes Clinical Summary of Care: Patient Declined Electronic Signature(s) Signed: 01/31/2021 1:01:44 PM By: Carlene Coria RN Entered By: Carlene Coria on 01/28/2021 09:23:08 Hladik, Maron D. ([id number]) -------------------------------------------------------------------------------- Lower Extremity Assessment Details Patient Name: Dennis Wise, Dennis D. Date of Service: 01/28/2021 8:45 AM Medical Record Number: [id number] Patient Account Number: [id number] Date of Birth/Sex: January 01, 1951 (70 y.o. Male) Treating RN: Carlene Coria Primary Care Clark Clowdus: Elsie Stain Other Clinician: Referring Mylinh Cragg: Owens Loffler Treating Ferry Matthis/Extender: Ricard Dillon Weeks in Treatment: 0 Edema Assessment Assessed: [Left: No]  [Right: No] Edema: [Left: Ye] [Right: s] Calf Left: Right: Point of Measurement: 35 cm From Medial Instep 42 cm Ankle Left: Right: Point of Measurement: 10 cm From Medial Instep 23 cm Knee To Floor Left: Right: From Medial Instep 45 cm Vascular Assessment Pulses: Dorsalis Pedis Palpable: [Left:Yes] Doppler Audible: [Left:Yes] Blood Pressure: Brachial: [Left:157] Ankle: [Left:Dorsalis Pedis: 180 1.15] Electronic Signature(s) Signed: 01/31/2021 1:01:44 PM By: Carlene Coria RN Entered By: Carlene Coria on 01/28/2021 09:11:42 Preisler, Naomi D. ([id number]) -------------------------------------------------------------------------------- Multi Wound Chart Details Patient Name: Dennis Wise, Dennis D. Date of Service: 01/28/2021 8:45 AM Medical Record Number: [id number] Patient Account Number: [id number] Date of Birth/Sex: May 09, 1950 (70 y.o. Male) Treating RN: Carlene Coria Primary Care Kanton Kamel: Elsie Stain Other Clinician: Referring Leonidus Rowand: Owens Loffler Treating Ivalene Platte/Extender: Ricard Dillon Weeks in Treatment: 0 Vital Signs Height(in): 72 Pulse(bpm): 75 Weight(lbs): 276 Blood Pressure(mmHg): 157/66 Body Mass Index(BMI): 37 Temperature(F): 98.1 Respiratory Rate(breaths/min): 18 Wound Assessments Treatment Notes Electronic Signature(s) Signed: 01/28/2021 5:14:01 PM By: Linton Ham MD Entered By: Linton Ham on 01/28/2021 09:35:04 Wahba, Sharol Roussel ([id number]) -------------------------------------------------------------------------------- Multi-Disciplinary Care Plan Details Patient Name: Dennis Wise, Dennis D. Date of Service: 01/28/2021 8:45 AM Medical Record Number: [id number] Patient Account Number: [id number] Date of Birth/Sex: [birth year] (70 y.o. Male) Treating RN: Carlene Coria Primary Care Jaimin Krupka: Elsie Stain Other Clinician: Referring Hayla Hinger: Owens Loffler Treating Abbey Veith/Extender: Ricard Dillon Weeks in Treatment: 0 Active  Inactive Electronic Signature(s) Signed: 01/31/2021 1:01:44 PM By: Carlene Coria RN Entered By: Carlene Coria on 01/28/2021 09:27:36 Dennis Wise, Dennis D. ([id number]) -------------------------------------------------------------------------------- Pain Assessment Details Patient Name: Dennis Wise, Dennis D. Date of Service: 01/28/2021 8:45 AM Medical Record Number: [id number] Patient Account Number: [id number] Date of Birth/Sex: 1950/04/24 (70 y.o. Male) Treating RN: Carlene Coria Primary Care Skylarr Liz: Elsie Stain Other Clinician: Referring Stevie Ertle: Owens Loffler Treating Marylee Belzer/Extender: Ricard Dillon Weeks in Treatment: 0 Active Problems Location of Pain Severity and Description of Pain Patient Has Paino No Site Locations Pain Management and Medication Current Pain Management: Electronic Signature(s) Signed: 01/31/2021 1:01:44 PM By: Carlene Coria RN Entered By: Carlene Coria on 01/28/2021 08:55:49 Tovey, Cohan D. ([id number]) -------------------------------------------------------------------------------- Patient/Caregiver Education Details Patient Name: Dennis Wise, Dennis D. Date of Service: 01/28/2021 8:45 AM Medical Record Number: [id number] Patient Account Number: [id number] Date of Birth/Gender: 03-Sep-1950 (70 y.o. Male) Treating RN: Carlene Coria Primary Care Physician: Elsie Stain Other Clinician: Referring Physician: Lorelei Pont,  Frederico Hamman Treating Physician/Extender: Tito Dine in Treatment: 0 Education Assessment Education Provided To: Patient Education Topics Provided Wound/Skin Impairment: Methods: Explain/Verbal Responses: State content correctly Electronic Signature(s) Signed: 01/31/2021 1:01:44 PM By: Carlene Coria RN Entered By: Carlene Coria on 01/28/2021 09:22:38 Dennis Wise, Dennis D. ([id number]) -------------------------------------------------------------------------------- Vitals Details Patient Name: Dennis Wise, Dennis D. Date of Service: 01/28/2021 8:45 AM Medical  Record Number: [id number] Patient Account Number: [id number] Date of Birth/Sex: [birth year] (70 y.o. Male) Treating RN: Carlene Coria Primary Care Treacy Holcomb: Elsie Stain Other Clinician: Referring Kimba Lottes: Owens Loffler Treating Johnita Palleschi/Extender: Tito Dine in Treatment: 0 Vital Signs Time Taken: 08:55 Temperature (F): 98.1 Height (in): 72 Pulse (bpm): 75 Source: Stated Respiratory Rate (breaths/min): 18 Weight (lbs): 276 Blood Pressure (mmHg): 157/66 Source: Measured Reference Range: 80 - 120 mg / dl Body Mass Index (BMI): 37.4 Electronic Signature(s) Signed: 01/31/2021 1:01:44 PM By: Carlene Coria RN Entered By: Carlene Coria on 01/28/2021 08:57:02

## 2021-01-31 NOTE — Progress Notes (Signed)
PERICLES, CARMICHEAL ([id number]) Visit Report for 01/28/2021 Chief Complaint Document Details Patient Name: Dennis Wise, Dennis Wise. Date of Service: 01/28/2021 8:45 AM Medical Record Number: [id number] Patient Account Number: [id number] Date of Birth/Sex: 05/04/1950 (70 y.o. Male) Treating RN: Carlene Coria Primary Care Provider: Elsie Stain Other Clinician: Referring Provider: Owens Loffler Treating Provider/Extender: Tito Dine in Treatment: 0 Information Obtained from: Patient Chief Complaint 01/28/2021; patient is here for review of wounds on the left lower extremity Electronic Signature(s) Signed: 01/28/2021 5:14:01 PM By: Linton Ham MD Entered By: Linton Ham on 01/28/2021 09:35:43 Dennis Wise, Dennis D. ([id number]) -------------------------------------------------------------------------------- HPI Details Patient Name: Draeger, Julien D. Date of Service: 01/28/2021 8:45 AM Medical Record Number: [id number] Patient Account Number: [id number] Date of Birth/Sex: [birth year] (70 y.o. Male) Treating RN: Carlene Coria Primary Care Provider: Elsie Stain Other Clinician: Referring Provider: Owens Loffler Treating Provider/Extender: Tito Dine in Treatment: 0 History of Present Illness HPI Description: ADMISSION 01/28/2021 This is a 70 year old active man who is still gainfully employed. He was seen by his primary physician on 01/16/2021 with swelling of the left greater than right lower leg and multiple areas of skin down on the left anterior tibial and left lateral tibial area. The patient states he has a history of vein disease but does not have a history of wounds. I think stockings have been suggested to him in the past but he is not use them. He states the areas that he is here for started out as a raised blister after he spent several hours working 12-hour days and these opened up. He was given a course of doxycycline in his primary care office. CandS apparently  showed staph. He is taken the last dose of doxycycline this morning. He has had marked trauma to this leg in the past including a left knee fracture in 2004 requiring an ORIF. He also had a left tibial fracture in 2012 requiring an ORIF by Dr. Rhona Raider. He has a known history of a pinkish-brown discolored patch on the left anterior leg. Past medical history includes type 2 diabetes last hemoglobin A1c of 7.6, left tibial fracture 2012 left knee fracture 2004 low back pain. ABI in our clinic was 1.15 on the left Electronic Signature(s) Signed: 01/28/2021 5:14:01 PM By: Linton Ham MD Entered By: Linton Ham on 01/28/2021 09:39:20 Dennis Wise, Dennis Wise ([id number]) -------------------------------------------------------------------------------- Physical Exam Details Patient Name: Sobczak, Lot D. Date of Service: 01/28/2021 8:45 AM Medical Record Number: [id number] Patient Account Number: [id number] Date of Birth/Sex: May 22, 1950 (70 y.o. Male) Treating RN: Carlene Coria Primary Care Provider: Elsie Stain Other Clinician: Referring Provider: Owens Loffler Treating Provider/Extender: Ricard Dillon Weeks in Treatment: 0 Constitutional Patient is hypertensive.. Pulse regular and within target range for patient.Marland Kitchen Respirations regular, non-labored and within target range.. Temperature is normal and within the target range for the patient.Marland Kitchen appears in no distress. Cardiovascular Pedal pulses are palpable on the left. Minimal edema in the left and right leg. There are signs of chronic venous insufficiency.. Integumentary (Hair, Skin) What appears to be hemosiderin deposition on the left leg anteriorly over a large area. Notes Wound exam; the area where there was wounds when looking at the picture from primary care in epic has healed over. Large area of pink/brown discoloration/hemosiderin deposition most likely on the anterior lower leg. This seems tightly fibrosed underlying  tissue Electronic Signature(s) Signed: 01/28/2021 5:14:01 PM By: Linton Ham MD Entered By: Linton Ham on 01/28/2021 09:40:59 Dennis Wise, Dennis D. ([id number]) -------------------------------------------------------------------------------- Physician Orders Details Patient Name:  Dennis Wise, Dennis D. Date of Service: 01/28/2021 8:45 AM Medical Record Number: [id number] Patient Account Number: [id number] Date of Birth/Sex: 05-07-1950 (70 y.o. Male) Treating RN: Carlene Coria Primary Care Provider: Elsie Stain Other Clinician: Referring Provider: Owens Loffler Treating Provider/Extender: Tito Dine in Treatment: 0 Verbal / Phone Orders: No Diagnosis Coding Discharge From [employer] Signature(s) Signed: 01/28/2021 5:14:01 PM By: Linton Ham MD Signed: 01/31/2021 1:01:44 PM By: Carlene Coria RN Entered By: Carlene Coria on 01/28/2021 09:21:51 Dennis Wise, Dennis D. ([id number]) -------------------------------------------------------------------------------- Problem List Details Patient Name: Brester, Acea D. Date of Service: 01/28/2021 8:45 AM Medical Record Number: [id number] Patient Account Number: [id number] Date of Birth/Sex: 10-27-1950 (70 y.o. Male) Treating RN: Carlene Coria Primary Care Provider: Elsie Stain Other Clinician: Referring Provider: Owens Loffler Treating Provider/Extender: Tito Dine in Treatment: 0 Active Problems ICD-10 Encounter Code Description Active Date MDM Diagnosis I87.332 Chronic venous hypertension (idiopathic) with ulcer and inflammation of 01/28/2021 No Yes left lower extremity L97.828 Non-pressure chronic ulcer of other part of left lower leg with other 01/28/2021 No Yes specified severity E11.622 Type 2 diabetes mellitus with other skin ulcer 01/28/2021 No Yes Inactive Problems Resolved Problems Electronic Signature(s) Signed: 01/28/2021 5:14:01 PM By: Linton Ham MD Entered By:  Linton Ham on 01/28/2021 09:34:52 Dennis Wise, Dennis D. ([id number]) -------------------------------------------------------------------------------- Progress Note Details Patient Name: Portugal, Hymie D. Date of Service: 01/28/2021 8:45 AM Medical Record Number: [id number] Patient Account Number: [id number] Date of Birth/Sex: [birth year] (70 y.o. Male) Treating RN: Carlene Coria Primary Care Provider: Elsie Stain Other Clinician: Referring Provider: Owens Loffler Treating Provider/Extender: Tito Dine in Treatment: 0 Subjective Chief Complaint Information obtained from Patient 01/28/2021; patient is here for review of wounds on the left lower extremity History of Present Illness (HPI) ADMISSION 01/28/2021 This is a 70 year old active man who is still gainfully employed. He was seen by his primary physician on 01/16/2021 with swelling of the left greater than right lower leg and multiple areas of skin down on the left anterior tibial and left lateral tibial area. The patient states he has a history of vein disease but does not have a history of wounds. I think stockings have been suggested to him in the past but he is not use them. He states the areas that he is here for started out as a raised blister after he spent several hours working 12-hour days and these opened up. He was given a course of doxycycline in his primary care office. CandS apparently showed staph. He is taken the last dose of doxycycline this morning. He has had marked trauma to this leg in the past including a left knee fracture in 2004 requiring an ORIF. He also had a left tibial fracture in 2012 requiring an ORIF by Dr. Rhona Raider. He has a known history of a pinkish-brown discolored patch on the left anterior leg. Past medical history includes type 2 diabetes last hemoglobin A1c of 7.6, left tibial fracture 2012 left knee fracture 2004 low back pain. ABI in our clinic was 1.15 on the left Patient  History Allergies promethazine Social History Never smoker, Marital Status - Married, Alcohol Use - Never, Drug Use - No History, Caffeine Use - Daily. Medical History Cardiovascular Patient has history of Hypertension Endocrine Patient has history of Type II Diabetes Patient is treated with Oral Agents. Review of Systems (ROS) Constitutional Symptoms (General Health) Denies complaints or symptoms of Fatigue, Fever, Chills, Marked Weight Change. Eyes Complains or has symptoms of Glasses /  Contacts. Genitourinary Denies complaints or symptoms of Kidney failure/ Dialysis, Incontinence/dribbling. Integumentary (Skin) Complains or has symptoms of Wounds. Objective Constitutional Patient is hypertensive.. Pulse regular and within target range for patient.Marland Kitchen Respirations regular, non-labored and within target range.. Temperature is normal and within the target range for the patient.Marland Kitchen appears in no distress. Vitals Time Taken: 8:55 AM, Height: 72 in, Source: Stated, Weight: 276 lbs, Source: Measured, BMI: 37.4, Temperature: 98.1 F, Pulse: 75 bpm, Dennis Wise, Dennis D. ([id number]) Respiratory Rate: 18 breaths/min, Blood Pressure: 157/66 mmHg. Cardiovascular Pedal pulses are palpable on the left. Minimal edema in the left and right leg. There are signs of chronic venous insufficiency.. General Notes: Wound exam; the area where there was wounds when looking at the picture from primary care in epic has healed over. Large area of pink/brown discoloration/hemosiderin deposition most likely on the anterior lower leg. This seems tightly fibrosed underlying tissue Integumentary (Hair, Skin) What appears to be hemosiderin deposition on the left leg anteriorly over a large area. Assessment Active Problems ICD-10 Chronic venous hypertension (idiopathic) with ulcer and inflammation of left lower extremity Non-pressure chronic ulcer of other part of left lower leg with other specified severity Type 2  diabetes mellitus with other skin ulcer Plan Discharge From [facility]: Consult Only 1. The area on his left leg probably relates to chronic hemosiderin deposition related to some degree of chronic venous insufficiency and a past history of trauma with ORIF's x2 in this leg. When I first look at this I thought about necrobiosis lipoidica although I am doubtful that is what this represents. 2. 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 I think the swelling here would easily cause blisters based on his chronic venous insufficiency. 3. We suggested 20/30 mm below-knee stockings to be worn on during the day and off at night and especially when he is going to be on his feet working long hours. Otherwise I think he is at risk for further breakdown in this area. 4. If he is back in short order with recurrence consider doing venous reflux studies. To date he did not have a prior history of wounds Electronic Signature(s) Signed: 01/28/2021 5:14:01 PM By: Linton Ham MD Entered By: Linton Ham on 01/28/2021 09:43:13 Dennis Wise, Dennis D. ([id number]) -------------------------------------------------------------------------------- ROS/PFSH Details Patient Name: Dennis Wise, Dennis D. Date of Service: 01/28/2021 8:45 AM Medical Record Number: [id number] Patient Account Number: [id number] Date of Birth/Sex: [birth year] (70 y.o. Male) Treating RN: Carlene Coria Primary Care Provider: Elsie Stain Other Clinician: Referring Provider: Owens Loffler Treating Provider/Extender: Ricard Dillon Weeks in Treatment: 0 Constitutional Symptoms (General Health) Complaints and Symptoms: Negative for: Fatigue; Fever; Chills; Marked Weight Change Eyes Complaints and Symptoms: Positive for: Glasses / Contacts Genitourinary Complaints and Symptoms: Negative for: Kidney failure/ Dialysis; Incontinence/dribbling Integumentary (Skin) Complaints and  Symptoms: Positive for: Wounds Cardiovascular Medical History: Positive for: Hypertension Endocrine Medical History: Positive for: Type II Diabetes Time with diabetes: 61 Treated with: Oral agents Immunizations Pneumococcal Vaccine: Received Pneumococcal Vaccination: Yes Received Pneumococcal Vaccination On or After 60th Birthday: Yes Implantable Devices None Family and Social History Never smoker; Marital Status - Married; Alcohol Use: Never; Drug Use: No History; Caffeine Use: Daily; Financial Concerns: No; Food, Clothing or Shelter Needs: No; Support System Lacking: No; Transportation Concerns: No Electronic Signature(s) Signed: 01/28/2021 5:14:01 PM By: Linton Ham MD Signed: 01/31/2021 1:01:44 PM By: Carlene Coria RN Entered By: Carlene Coria on 01/28/2021 09:02:29 Dennis Wise, Dennis D. ([id number]) --------------------------------------------------------------------------------  SuperBill Details Patient Name: Dennis Wise, Dennis Wise. Date of Service: 01/28/2021 Medical Record Number: [id number] Patient Account Number: [id number] Date of Birth/Sex: Oct 04, 1950 (70 y.o. Male) Treating RN: Carlene Coria Primary Care Provider: Elsie Stain Other Clinician: Referring Provider: Owens Loffler Treating Provider/Extender: Ricard Dillon Weeks in Treatment: 0 Diagnosis Coding ICD-10 Codes Code Description [phone] Chronic venous hypertension (idiopathic) with ulcer and inflammation of left lower extremity L97.828 Non-pressure chronic ulcer of other part of left lower leg with other specified severity E11.622 Type 2 diabetes mellitus with other skin ulcer Facility Procedures CPT4 Code: [id number] Description: 99213 - WOUND CARE VISIT-LEV 3 EST PT Modifier: Quantity: 1 Physician Procedures CPT4 Code Description: [id number] WC PHYS LEVEL 3 o NEW PT Modifier: Quantity: 1 CPT4 Code Description: ICD-10 Diagnosis Description I87.332 Chronic venous hypertension (idiopathic) with ulcer and  inflammation of le L97.828 Non-pressure chronic ulcer of other part of left lower leg with other spec E11.622 Type 2 diabetes mellitus  with other skin ulcer Modifier: ft lower extremity ified severity Quantity: Electronic Signature(s) Signed: 01/28/2021 5:14:01 PM By: Linton Ham MD Entered By: Linton Ham on 01/28/2021 09:43:46

## 2021-01-31 NOTE — Progress Notes (Signed)
STEWARD, SAMES ([id number]) Visit Report for 01/28/2021 Abuse/Suicide Risk Screen Details Patient Name: Dennis Wise, Dennis Wise. Date of Service: 01/28/2021 8:45 AM Medical Record Number: [id number] Patient Account Number: [id number] Date of Birth/Sex: 1950/08/06 (70 y.o. Male) Treating RN: Carlene Coria Primary Care Mahnoor Mathisen: Elsie Stain Other Clinician: Referring Ziv Welchel: Owens Loffler Treating Demani Weyrauch/Extender: Ricard Dillon Weeks in Treatment: 0 Abuse/Suicide Risk Screen Items Answer ABUSE RISK SCREEN: Has anyone close to you tried to hurt or harm you recentlyo No Do you feel uncomfortable with anyone in your familyo No Has anyone forced you do things that you didnot want to doo No Electronic Signature(s) Signed: 01/31/2021 1:01:44 PM By: Carlene Coria RN Entered By: Carlene Coria on 01/28/2021 09:02:34 Wise, Dennis D. ([id number]) -------------------------------------------------------------------------------- Activities of Daily Living Details Patient Name: Wise, Dennis D. Date of Service: 01/28/2021 8:45 AM Medical Record Number: [id number] Patient Account Number: [id number] Date of Birth/Sex: 1951/01/17 (70 y.o. Male) Treating RN: Carlene Coria Primary Care Jakyri Brunkhorst: Elsie Stain Other Clinician: Referring Anel Creighton: Owens Loffler Treating Jessicalynn Deshong/Extender: Ricard Dillon Weeks in Treatment: 0 Activities of Daily Living Items Answer Activities of Daily Living (Please select one for each item) Drive Automobile Completely Able Take Medications Completely Able Use Telephone Completely Able Care for Appearance Completely Able Use Toilet Completely Able Bath / Shower Completely Able Dress Self Completely Able Feed Self Completely Able Walk Completely Able Get In / Out Bed Completely Able Housework Completely Able Prepare Meals Completely [place] for Self Completely Able Electronic Signature(s) Signed: 01/31/2021 1:01:44 PM By: Carlene Coria RN Entered By: Carlene Coria on 01/28/2021 09:02:57 Wempe, Hershy D. ([id number]) -------------------------------------------------------------------------------- Education Screening Details Patient Name: Wise, Dennis D. Date of Service: 01/28/2021 8:45 AM Medical Record Number: [id number] Patient Account Number: [id number] Date of Birth/Sex: 1950/06/14 (70 y.o. Male) Treating RN: Carlene Coria Primary Care Duff Pozzi: Elsie Stain Other Clinician: Referring Lovell Roe: Owens Loffler Treating Aliou Mealey/Extender: Tito Dine in Treatment: 0 Primary Learner Assessed: Patient Learning Preferences/Education Level/Primary Language Learning Preference: Explanation Highest Education Level: High School Preferred Language: English Cognitive Barrier Language Barrier: No Translator Needed: No Memory Deficit: No Emotional Barrier: No Cultural/Religious Beliefs Affecting Medical Care: No Physical Barrier Impaired Vision: Yes Glasses Impaired Hearing: No Decreased Hand dexterity: No Knowledge/Comprehension Knowledge Level: Medium Comprehension Level: High Ability to understand written instructions: High Ability to understand verbal instructions: High Motivation Anxiety Level: Anxious Cooperation: Cooperative Education Importance: Acknowledges Need Interest in Health Problems: Asks Questions Perception: Coherent Willingness to Engage in Self-Management High Activities: Readiness to Engage in Self-Management High Activities: Electronic Signature(s) Signed: 01/31/2021 1:01:44 PM By: Carlene Coria RN Entered By: Carlene Coria on 01/28/2021 09:03:35 Wise, Dennis D. ([id number]) -------------------------------------------------------------------------------- Fall Risk Assessment Details Patient Name: Wise, Dennis D. Date of Service: 01/28/2021 8:45 AM Medical Record Number: [id number] Patient Account Number: [id number] Date of Birth/Sex: 01-18-1951 (70 y.o. Male) Treating RN:  Carlene Coria Primary Care Jacinto Keil: Elsie Stain Other Clinician: Referring Stoy Fenn: Owens Loffler Treating Kjersti Dittmer/Extender: Tito Dine in Treatment: 0 Fall Risk Assessment Items Have you had 2 or more falls in the last 12 monthso 0 No Have you had any fall that resulted in injury in the last 12 monthso 0 No FALLS RISK SCREEN History of falling - immediate or within 3 months 0 No Secondary diagnosis (Do you have 2 or more medical diagnoseso) 0 No Ambulatory aid None/bed rest/wheelchair/nurse 0 No Crutches/cane/walker 0 No Furniture 0 No Intravenous therapy Access/Saline/Heparin Lock 0 No Gait/Transferring Normal/ bed rest/ wheelchair 0 No  Weak (short steps with or without shuffle, stooped but able to lift head while walking, may 0 No seek support from furniture) Impaired (short steps with shuffle, may have difficulty arising from chair, head down, impaired 0 No balance) Mental Status Oriented to own ability 0 No Electronic Signature(s) Signed: 01/31/2021 1:01:44 PM By: Carlene Coria RN Entered By: Carlene Coria on 01/28/2021 09:04:02 Wise, Dennis D. ([id number]) -------------------------------------------------------------------------------- Foot Assessment Details Patient Name: Wise, Dennis D. Date of Service: 01/28/2021 8:45 AM Medical Record Number: [id number] Patient Account Number: [id number] Date of Birth/Sex: 1950-06-10 (70 y.o. Male) Treating RN: Carlene Coria Primary Care Quadry Kampa: Elsie Stain Other Clinician: Referring Vanetta Rule: Owens Loffler Treating Kayleen Alig/Extender: Ricard Dillon Weeks in Treatment: 0 Foot Assessment Items Site Locations + = Sensation present, - = Sensation absent, C = Callus, U = Ulcer R = Redness, W = Warmth, M = Maceration, PU = Pre-ulcerative lesion F = Fissure, S = Swelling, D = Dryness Assessment Right: Left: Other Deformity: No No Prior Foot Ulcer: No No Prior Amputation: No No Charcot Joint: No  No Ambulatory Status: Ambulatory Without Help Gait: Steady Electronic Signature(s) Signed: 01/31/2021 1:01:44 PM By: Carlene Coria RN Entered By: Carlene Coria on 01/28/2021 09:06:36 Wise, Dennis D. ([id number]) -------------------------------------------------------------------------------- Nutrition Risk Screening Details Patient Name: Dyal, Dabney D. Date of Service: 01/28/2021 8:45 AM Medical Record Number: [id number] Patient Account Number: [id number] Date of Birth/Sex: 03/30/1951 (70 y.o. Male) Treating RN: Carlene Coria Primary Care Krisna Omar: Elsie Stain Other Clinician: Referring Lyam Provencio: Owens Loffler Treating Katyra Tomassetti/Extender: Ricard Dillon Weeks in Treatment: 0 Height (in): 72 Weight (lbs): 276 Body Mass Index (BMI): 37.4 Nutrition Risk Screening Items Score Screening NUTRITION RISK SCREEN: I have an illness or condition that made me change the kind and/or amount of food I eat 0 No I eat fewer than two meals per day 0 No I eat few fruits and vegetables, or milk products 0 No I have three or more drinks of beer, liquor or wine almost every day 0 No I have tooth or mouth problems that make it hard for me to eat 0 No I don't always have enough money to buy the food I need 0 No I eat alone most of the time 0 No I take three or more different prescribed or over-the-counter drugs a day 1 Yes Without wanting to, I have lost or gained 10 pounds in the last six months 0 No I am not always physically able to shop, cook and/or feed myself 0 No Nutrition Protocols Good Risk Protocol 0 No interventions needed Moderate Risk Protocol High Risk Proctocol Risk Level: Good Risk Score: 1 Electronic Signature(s) Signed: 01/31/2021 1:01:44 PM By: Carlene Coria RN Entered By: Carlene Coria on 01/28/2021 09:04:22

## 2021-02-08 ENCOUNTER — Ambulatory Visit: Payer: Medicare HMO | Admitting: Family Medicine

## 2021-02-10 ENCOUNTER — Telehealth: Payer: Self-pay | Admitting: Family Medicine

## 2021-02-10 NOTE — Telephone Encounter (Signed)
Called patient's wife and discussed that I would be out of clinic on Monday due to an illness.  Please contact patient and reschedule if needed.  Wife reported that he had significantly improved in the meantime so it may make sense to cancel his appointment for Monday and just keep his future appointment as scheduled.  Thanks.

## 2021-02-11 ENCOUNTER — Ambulatory Visit: Payer: Medicare HMO | Admitting: Family Medicine

## 2021-02-12 NOTE — Telephone Encounter (Signed)
Patient is currently scheduled for 03/11/21

## 2021-02-18 DIAGNOSIS — I10 Essential (primary) hypertension: Secondary | ICD-10-CM

## 2021-02-18 DIAGNOSIS — E113299 Type 2 diabetes mellitus with mild nonproliferative diabetic retinopathy without macular edema, unspecified eye: Secondary | ICD-10-CM | POA: Diagnosis not present

## 2021-02-18 DIAGNOSIS — E7800 Pure hypercholesterolemia, unspecified: Secondary | ICD-10-CM

## 2021-02-25 DIAGNOSIS — Z20828 Contact with and (suspected) exposure to other viral communicable diseases: Secondary | ICD-10-CM | POA: Diagnosis not present

## 2021-03-03 NOTE — Progress Notes (Signed)
Subjective:   Dennis Wise is a 70 y.o. male who presents for Medicare Annual/Subsequent preventive examination.  I connected with Dennis Wise today by telephone and verified that I am speaking with the correct person using two identifiers. Location patient: home Location provider: work Persons participating in the virtual visit: patient, Marine scientist.    I discussed the limitations, risks, security and privacy concerns of performing an evaluation and management service by telephone and the availability of in person appointments. I also discussed with the patient that there may be a patient responsible charge related to this service. The patient expressed understanding and verbally consented to this telephonic visit.    Interactive audio and video telecommunications were attempted between this provider and patient, however failed, due to patient having technical difficulties OR patient did not have access to video capability.  We continued and completed visit with audio only.  Some vital signs may be absent or patient reported.   Time Spent with patient on telephone encounter: 20 minutes  Review of Systems     Cardiac Risk Factors include: advanced age (>44mn, >>70women);diabetes mellitus;hypertension     Objective:    Today's Vitals   03/05/21 0857  Weight: 260 lb (117.9 kg)  Height: 6' (1.829 m)   Body mass index is 35.26 kg/m.  Advanced Directives 03/05/2021 02/21/2019 01/30/2017 02/04/2016 10/22/2015 10/22/2015 01/29/2015  Does Patient Have a Medical Advance Directive? _0  Yes Yes  Type of AParamedicof ACrystal BeachLiving will H[place]Living will HB[place]chwoodLiving will - HCrown HeightsLiving will HArendtsvilleLiving will [place]te[place]Living will  Does patient want to make changes to medical advance directive? Yes (MAU/Ambulatory/Procedural Areas - Information given)  - - - No - Patient declined No - Patient declined -  Copy of H[place]in Chart? - No - copy requested No - copy requested - - - No - copy requested    Current Medications (verified) Outpatient Encounter Medications as of 03/05/2021  Medication Sig   ACCU-CHEK FASTCLIX LANCETS MISC Use to test blood sugar once daily or as needed.  Diagnosis:  E11.3299  Non insulin dependent.   aspirin 81 MG tablet Take 81 mg by mouth daily.   atorvastatin (LIPITOR) 10 MG tablet TAKE 1 TABLET (10 MG TOTAL) BY MOUTH AT BEDTIME.   Blood Glucose Monitoring Suppl (ACCU-CHEK NANO SMARTVIEW) w/Device KIT Use to check blood sugar once daily or as needed.  Diagnosis: E11.3299  Non insulin dependent.   doxycycline (VIBRA-TABS) 100 MG tablet Take 1 tablet (100 mg total) by mouth 2 (two) times daily.   glimepiride (AMARYL) 2 MG tablet TAKE 1 TABLET EVERY DAY BEFORE BREAKFAST   glucose blood (ACCU-CHEK SMARTVIEW) test strip Use as instructed to test blood sugar once daily or as needed.  Diagnosis:  E11.3299  Non insulin dependent.   hydrochlorothiazide (HYDRODIURIL) 12.5 MG tablet Take 1 tablet (12.5 mg total) by mouth daily.   metFORMIN (GLUCOPHAGE) 850 MG tablet TAKE 1 TABLET TWICE DAILY   Multiple Vitamin (MULTIVITAMIN) tablet Take 1 tablet by mouth daily. Centrum Silver   niacin (NIASPAN) 500 MG CR tablet TAKE 3 TABLETS AT BEDTIME   pioglitazone (ACTOS) 45 MG tablet TAKE 1 TABLET EVERY DAY   ramipril (ALTACE) 5 MG capsule TAKE 1 CAPSULE (5 MG TOTAL) BY MOUTH DAILY.   No facility-administered encounter medications on file as of 03/05/2021.    Allergies (verified) Promethazine hcl and  Other   History: Past Medical History:  Diagnosis Date   Anemia    Iron deficiency part of this   Cataract    bilateral lens implants   Diabetes mellitus    type II   GERD (gastroesophageal reflux disease) 10/27   Helicobacter pylori gastritis 06/11/2011   On EGD 05/2011    Hyperlipidemia    Hypertension  08/01   Internal hemorrhoids    Iron deficiency anemia, unspecified 03/04/2011   MGUS (monoclonal gammopathy of unknown significance)    possible dx initially, had negative f/u   Pancytopenia    Personal history of colonic adenomas 06/05/2012   Pneumonia    Past Surgical History:  Procedure Laterality Date   CATARACT EXTRACTION Bilateral    COLONOSCOPY     FLEXIBLE SIGMOIDOSCOPY  05/1988   internal hemorrhoids   KNEE SURGERY  09/04   lt knee fx repair ORIF   TIBIA FRACTURE SURGERY     left 2012   Family History  Problem Relation Age of Onset   Skin cancer Brother    Diabetes Brother    Emphysema Father    Cancer Father        ?   Alzheimer's disease Father    Cancer Mother        ?   Diabetes Mother    Heart failure Mother    Colon cancer Neg Hx    Prostate cancer Neg Hx    Esophageal cancer Neg Hx    Rectal cancer Neg Hx    Stomach cancer Neg Hx    Social History   Socioeconomic History   Marital status: Married    Spouse name: Not on file   Number of children: Not on file   Years of education: Not on file   Highest education level: Not on file  Occupational History   Not on file  Tobacco Use   Smoking status: Never   Smokeless tobacco: Never  Vaping Use   Vaping Use: Never used  Substance and Sexual Activity   Alcohol use: Never   Drug use: No   Sexual activity: Yes  Other Topics Concern   Not on file  Social History Narrative   Prev traveling for sports broadcasting    Working for [employer], [employer] as of 2019   Married 1970   1 child   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     Social Determinants of Health   Financial Resource Strain: Low Risk    Difficulty of Paying Living Expenses: Not hard at all  Food Insecurity: No Food Insecurity   Worried About Charity fundraiser in the Last Year: Never true   [place] in the Last Year: Never true  Transportation Needs: No Transportation Needs   Lack of Transportation (Medical): No   Lack of  Transportation (Non-Medical): No  Physical Activity: Unknown   Days of Exercise per Week: 7 days   Minutes of Exercise per Session: Not on file  Stress: No Stress Concern Present   Feeling of Stress : Not at all  Social Connections: Moderately Isolated   Frequency of Communication with Friends and Family: Twice a week   Frequency of Social Gatherings with Friends and Family: More than three times a week   Attends Religious Services: Never   Marine scientist or Organizations: No   Attends Archivist Meetings: Never   Marital Status: Married    Tobacco Counseling Counseling given: Not Answered   Clinical  Intake:  Pre-visit preparation completed: Yes  Pain : No/denies pain     BMI - recorded: 35.26 Nutritional Status: BMI > 30  Obese Nutritional Risks: None Diabetes: Yes CBG done?: No (visit completed over the phone) Did pt. bring in CBG monitor from home?: No  How often do you need to have someone help you when you read instructions, pamphlets, or other written materials from your doctor or pharmacy?: 1 - Never  Diabetes:  Is the patient diabetic?  Yes  If diabetic, was a CBG obtained today?  No  , visit completed over the phone Did the patient bring in their glucometer from home?  No , visit completed over the phone. How often do you monitor your CBG's? daily.   Financial Strains and Diabetes Management:  Are you having any financial strains with the device, your supplies or your medication? No .  Does the patient want to be seen by Chronic Care Management for management of their diabetes?  No  Would the patient like to be referred to a Nutritionist or for Diabetic Management?  No   Diabetic Exams:  Diabetic Eye Exam: Completed 09/03/20.   Diabetic Foot Exam: Due, Pt has an upcoming appointment on 03/11/21 with PCP  Interpreter Needed?: No  Information entered by :: Orrin Brigham LPN   Activities of Daily Living In your present state of  health, do you have any difficulty performing the following activities: 03/05/2021  Hearing? Y  Vision? N  Difficulty concentrating or making decisions? N  Walking or climbing stairs? N  Dressing or bathing? N  Doing errands, shopping? N  Preparing Food and eating ? N  Using the Toilet? N  In the past six months, have you accidently leaked urine? N  Do you have problems with loss of bowel control? N  Managing your Medications? N  Managing your Finances? N  Housekeeping or managing your Housekeeping? N  Some recent data might be hidden    Patient Care Team: [name], MD as PCP - General (Family Medicine) Gearlean Alf, MD as Referring Physician (Ophthalmology) Nicholas Lose, MD as Consulting Physician (Hematology and Oncology) [name], [facility] as Pharmacist (Pharmacist)  Indicate any recent Medical Services you may have received from other than [facility] providers in the past year (date may be approximate).     Assessment:   This is a routine wellness examination for [facility].  Hearing/Vision screen Hearing Screening - Comments:: Decrease hearing in both ears , right worse than the left Vision Screening - Comments:: Last exam 12/2020, Dr. Cordelia Pen @ [facility], wears glasses  Dietary issues and exercise activities discussed: Current Exercise Habits: The patient has a physically strenuous job, but has no regular exercise apart from work.   Goals Addressed             This Visit's Progress    Patient Stated       Would like to drink more water and watch diet.       Depression Screen PHQ 2/9 Scores 03/05/2021 03/01/2020 02/21/2019 02/09/2018 01/30/2017 10/22/2015 10/22/2015  PHQ - 2 Score 0 0 0 0 0 0 0  PHQ- 9 Score - - 0 - 0 - -    Fall Risk Fall Risk  03/05/2021 03/01/2020 02/21/2019 02/09/2018 01/30/2017  Falls in the past year? 0 0 0 No No  Number falls in past yr: 0 - 0 - -  Injury with Fall? 0 - 0 - -  Risk for fall due to :  No Fall Risks - Medication  side effect - -  Follow up Falls prevention discussed - Falls evaluation completed;Falls prevention discussed - -    FALL RISK PREVENTION PERTAINING TO THE HOME:  Any stairs in or around the home? Yes  If so, are there any without handrails? No  Home free of loose throw rugs in walkways, pet beds, electrical cords, etc? Yes  Adequate lighting in your home to reduce risk of falls? Yes   ASSISTIVE DEVICES UTILIZED TO PREVENT FALLS:  Life alert? No  Use of a cane, walker or w/c? No  Grab bars in the bathroom? Yes  Shower chair or bench in shower? Yes  Elevated toilet seat or a handicapped toilet? Yes   TIMED UP AND GO:  Was the test performed? No , visit completed over the phone.    Cognitive Function: Normal cognitive status assessed by this Nurse Health Advisor. No abnormalities found.   MMSE - Mini Mental State Exam 02/21/2019 01/30/2017 10/22/2015  Orientation to time _0 Orientation to Place _1 Registration _2 Attention/ Calculation 5 0 0  Recall _3 Language- name 2 objects - 0 0  Language- repeat _4 Language- follow 3 step command - 3 3  Language- read & follow direction - 0 0  Write a sentence - 0 0  Copy design - 0 0  Total score - 20 20        Immunizations Immunization History  Administered Date(s) Administered   Fluad Quad(high Dose 65+) 02/28/2019, 03/01/2020   Influenza Split 03/03/2011, 02/03/2012   Influenza,inj,Quad PF,6+ Mos 02/28/2013, 02/20/2015, 05/12/2016, 01/30/2017, 02/09/2018   PFIZER(Purple Top)SARS-COV-2 Vaccination 06/20/2019, 07/11/2019, 03/26/2020   Pneumococcal Conjugate-13 10/14/2013   Pneumococcal Polysaccharide-23 08/22/1998, 03/03/2011, 01/30/2017   Td 08/22/1998, 10/16/2009   Zoster, Live 02/28/2013    TDAP status: Due, Education has been provided regarding the importance of this vaccine. Advised may receive this vaccine at local pharmacy or Health Dept. Aware to provide a copy of the vaccination record if  obtained from local pharmacy or Health Dept. Verbalized acceptance and understanding.  Flu Vaccine status: Due, Education has been provided regarding the importance of this vaccine. Advised may receive this vaccine at local pharmacy or Health Dept. Aware to provide a copy of the vaccination record if obtained from local pharmacy or Health Dept. Verbalized acceptance and understanding.  Pneumococcal vaccine status: Up to date  Covid-19 vaccine status: Information provided on how to obtain vaccines.   Qualifies for Shingles Vaccine? Yes   Zostavax completed Yes   Shingrix Completed?: No.    Education has been provided regarding the importance of this vaccine. Patient has been advised to call insurance company to determine out of pocket expense if they have not yet received this vaccine. Advised may also receive vaccine at local pharmacy or Health Dept. Verbalized acceptance and understanding.  Screening Tests Health Maintenance  Topic Date Due   Zoster Vaccines- Shingrix (1 of 2) Never done   TETANUS/TDAP  10/17/2019   COVID-19 Vaccine (4 - Booster for Pfizer series) 05/21/2020   INFLUENZA VACCINE  11/19/2020   FOOT EXAM  03/01/2021   HEMOGLOBIN A1C  03/13/2021   OPHTHALMOLOGY EXAM  09/03/2021   COLONOSCOPY (Pts 45-65yr Insurance coverage will need to be confirmed)  03/07/2023   Pneumonia Vaccine 72 Years old  Completed   Hepatitis C Screening  Completed   HPV VACCINES  Aged Out    Health  Maintenance  Health Maintenance Due  Topic Date Due   Zoster Vaccines- Shingrix (1 of 2) Never done   TETANUS/TDAP  10/17/2019   COVID-19 Vaccine (4 - Booster for Pfizer series) 05/21/2020   INFLUENZA VACCINE  11/19/2020   FOOT EXAM  03/01/2021    Colorectal cancer screening: Type of screening: Colonoscopy. Completed 03/06/20. Repeat every 5 years  Lung Cancer Screening: (Low Dose CT Chest recommended if Age 50-80 years, 30 pack-year currently smoking OR have quit w/in 15years.) does not  qualify.     Additional Screening:  Hepatitis C Screening: does qualify; Completed 10/16/15  Vision Screening: Recommended annual ophthalmology exams for early detection of glaucoma and other disorders of the eye. Is the patient up to date with their annual eye exam?  Yes  Who is the provider or what is the name of the office in which the patient attends annual eye exams? Dr. Cordelia Pen   Dental Screening: Recommended annual dental exams for proper oral hygiene  Community Resource Referral / Chronic Care Management: CRR required this visit?  No   CCM required this visit?  No      Plan:     I have personally reviewed and noted the following in the patient's chart:   Medical and social history Use of alcohol, tobacco or illicit drugs  Current medications and supplements including opioid prescriptions. Patient is not currently taking opioid prescriptions. Functional ability and status Nutritional status Physical activity Advanced directives List of other physicians Hospitalizations, surgeries, and ER visits in previous 12 months Vitals Screenings to include cognitive, depression, and falls Referrals and appointments  In addition, I have reviewed and discussed with patient certain preventive protocols, quality metrics, and best practice recommendations. A written personalized care plan for preventive services as well as general preventive health recommendations were provided to patient.   Due to this being a telephonic visit, the after visit summary with patients personalized plan was offered to patient via mail or my-chart.  Patient would like to access on my-chart   Loma Messing, LPN  [id number]   Nurse Health Advisor   Nurse Notes: none

## 2021-03-05 ENCOUNTER — Ambulatory Visit (INDEPENDENT_AMBULATORY_CARE_PROVIDER_SITE_OTHER): Payer: Medicare HMO

## 2021-03-05 VITALS — Ht 72.0 in | Wt 260.0 lb

## 2021-03-05 DIAGNOSIS — Z0000 Encounter for general adult medical examination without abnormal findings: Secondary | ICD-10-CM | POA: Diagnosis not present

## 2021-03-05 NOTE — Patient Instructions (Signed)
Dennis Wise , Thank you for taking time to complete your Medicare Wellness Visit. I appreciate your ongoing commitment to your health goals. Please review the following plan we discussed and let me know if I can assist you in the future.   Screening recommendations/referrals: Colonoscopy: up to date , completed 03/06/20, Due 03/06/25 Recommended yearly ophthalmology/optometry visit for glaucoma screening and checkup Recommended yearly dental visit for hygiene and checkup  Vaccinations: Influenza vaccine: Due-May obtain vaccine at our office or your local pharmacy.  Pneumococcal vaccine: up to date Tdap vaccine: Due-May obtain vaccine at your local pharmacy. Shingles vaccine: Discuss with your local pharmacy Covid-19: Newest booster available at your local pharmacy  Advanced directives: Please bring a copy of Living Will and/or [place] for your chart.   Conditions/risks identified: see problem list  Next appointment: Follow up in one year for your annual wellness visit.   Preventive Care 72 Years and Older, Male Preventive care refers to lifestyle choices and visits with your health care provider that can promote health and wellness. What does preventive care include? A yearly physical exam. This is also called an annual well check. Dental exams once or twice a year. Routine eye exams. Ask your health care provider how often you should have your eyes checked. Personal lifestyle choices, including: Daily care of your teeth and gums. Regular physical activity. Eating a healthy diet. Avoiding tobacco and drug use. Limiting alcohol use. Practicing safe sex. Taking low doses of aspirin every day. Taking vitamin and mineral supplements as recommended by your health care provider. What happens during an annual well check? The services and screenings done by your health care provider during your annual well check will depend on your age, overall health, lifestyle risk  factors, and family history of disease. Counseling  Your health care provider may ask you questions about your: Alcohol use. Tobacco use. Drug use. Emotional well-being. Home and relationship well-being. Sexual activity. Eating habits. History of falls. Memory and ability to understand (cognition). Work and work Statistician. Screening  You may have the following tests or measurements: Height, weight, and BMI. Blood pressure. Lipid and cholesterol levels. These may be checked every 5 years, or more frequently if you are over 32 years old. Skin check. Lung cancer screening. You may have this screening every year starting at age 72 if you have a 30-pack-year history of smoking and currently smoke or have quit within the past 15 years. Fecal occult blood test (FOBT) of the stool. You may have this test every year starting at age 75. Flexible sigmoidoscopy or colonoscopy. You may have a sigmoidoscopy every 5 years or a colonoscopy every 10 years starting at age 72. Prostate cancer screening. Recommendations will vary depending on your family history and other risks. Hepatitis C blood test. Hepatitis B blood test. Sexually transmitted disease (STD) testing. Diabetes screening. This is done by checking your blood sugar (glucose) after you have not eaten for a while (fasting). You may have this done every 1-3 years. Abdominal aortic aneurysm (AAA) screening. You may need this if you are a current or former smoker. Osteoporosis. You may be screened starting at age 72 if you are at high risk. Talk with your health care provider about your test results, treatment options, and if necessary, the need for more tests. Vaccines  Your health care provider may recommend certain vaccines, such as: Influenza vaccine. This is recommended every year. Tetanus, diphtheria, and acellular pertussis (Tdap, Td) vaccine. You may need a  Td booster every 10 years. Zoster vaccine. You may need this after age  72. Pneumococcal 13-valent conjugate (PCV13) vaccine. One dose is recommended after age 72. Pneumococcal polysaccharide (PPSV23) vaccine. One dose is recommended after age 72. Talk to your health care provider about which screenings and vaccines you need and how often you need them. This information is not intended to replace advice given to you by your health care provider. Make sure you discuss any questions you have with your health care provider. Document Released: 05/04/2015 Document Revised: 12/26/2015 Document Reviewed: 02/06/2015 Elsevier Interactive Patient Education  2017 [employer] Prevention in the Home Falls can cause injuries. They can happen to people of all ages. There are many things you can do to make your home safe and to help prevent falls. What can I do on the outside of my home? Regularly fix the edges of walkways and driveways and fix any cracks. Remove anything that might make you trip as you walk through a door, such as a raised step or threshold. Trim any bushes or trees on the path to your home. Use bright outdoor lighting. Clear any walking paths of anything that might make someone trip, such as rocks or tools. Regularly check to see if handrails are loose or broken. Make sure that both sides of any steps have handrails. Any raised decks and porches should have guardrails on the edges. Have any leaves, snow, or ice cleared regularly. Use sand or salt on walking paths during winter. Clean up any spills in your garage right away. This includes oil or grease spills. What can I do in the bathroom? Use night lights. Install grab bars by the toilet and in the tub and shower. Do not use towel bars as grab bars. Use non-skid mats or decals in the tub or shower. If you need to sit down in the shower, use a plastic, non-slip stool. Keep the floor dry. Clean up any water that spills on the floor as soon as it happens. Remove soap buildup in the tub or shower  regularly. Attach bath mats securely with double-sided non-slip rug tape. Do not have throw rugs and other things on the floor that can make you trip. What can I do in the bedroom? Use night lights. Make sure that you have a light by your bed that is easy to reach. Do not use any sheets or blankets that are too big for your bed. They should not hang down onto the floor. Have a firm chair that has side arms. You can use this for support while you get dressed. Do not have throw rugs and other things on the floor that can make you trip. What can I do in the kitchen? Clean up any spills right away. Avoid walking on wet floors. Keep items that you use a lot in easy-to-reach places. If you need to reach something above you, use a strong step stool that has a grab bar. Keep electrical cords out of the way. Do not use floor polish or wax that makes floors slippery. If you must use wax, use non-skid floor wax. Do not have throw rugs and other things on the floor that can make you trip. What can I do with my stairs? Do not leave any items on the stairs. Make sure that there are handrails on both sides of the stairs and use them. Fix handrails that are broken or loose. Make sure that handrails are as long as the stairways. Check any  carpeting to make sure that it is firmly attached to the stairs. Fix any carpet that is loose or worn. Avoid having throw rugs at the top or bottom of the stairs. If you do have throw rugs, attach them to the floor with carpet tape. Make sure that you have a light switch at the top of the stairs and the bottom of the stairs. If you do not have them, ask someone to add them for you. What else can I do to help prevent falls? Wear shoes that: Do not have high heels. Have rubber bottoms. Are comfortable and fit you well. Are closed at the toe. Do not wear sandals. If you use a stepladder: Make sure that it is fully opened. Do not climb a closed stepladder. Make sure that  both sides of the stepladder are locked into place. Ask someone to hold it for you, if possible. Clearly mark and make sure that you can see: Any grab bars or handrails. First and last steps. Where the edge of each step is. Use tools that help you move around (mobility aids) if they are needed. These include: Canes. Walkers. Scooters. Crutches. Turn on the lights when you go into a dark area. Replace any light bulbs as soon as they burn out. Set up your furniture so you have a clear path. Avoid moving your furniture around. If any of your floors are uneven, fix them. If there are any pets around you, be aware of where they are. Review your medicines with your doctor. Some medicines can make you feel dizzy. This can increase your chance of falling. Ask your doctor what other things that you can do to help prevent falls. This information is not intended to replace advice given to you by your health care provider. Make sure you discuss any questions you have with your health care provider. Document Released: 02/01/2009 Document Revised: 09/13/2015 Document Reviewed: 05/12/2014 Elsevier Interactive Patient Education  2017 [employer].

## 2021-03-06 ENCOUNTER — Other Ambulatory Visit: Payer: Self-pay

## 2021-03-06 ENCOUNTER — Other Ambulatory Visit (INDEPENDENT_AMBULATORY_CARE_PROVIDER_SITE_OTHER): Payer: Medicare HMO

## 2021-03-06 DIAGNOSIS — D61818 Other pancytopenia: Secondary | ICD-10-CM

## 2021-03-06 DIAGNOSIS — E113299 Type 2 diabetes mellitus with mild nonproliferative diabetic retinopathy without macular edema, unspecified eye: Secondary | ICD-10-CM | POA: Diagnosis not present

## 2021-03-06 LAB — COMPREHENSIVE METABOLIC PANEL
ALT: 21 U/L (ref 0–53)
AST: 28 U/L (ref 0–37)
Albumin: 4.3 g/dL (ref 3.5–5.2)
Alkaline Phosphatase: 104 U/L (ref 39–117)
BUN: 32 mg/dL — ABNORMAL HIGH (ref 6–23)
CO2: 26 mEq/L (ref 19–32)
Calcium: 9.6 mg/dL (ref 8.4–10.5)
Chloride: 101 mEq/L (ref 96–112)
Creatinine, Ser: 1.47 mg/dL (ref 0.40–1.50)
GFR: 47.3 mL/min — ABNORMAL LOW (ref >60.00)
Glucose, Bld: 110 mg/dL — ABNORMAL HIGH (ref 70–99)
Potassium: 5.2 mEq/L — ABNORMAL HIGH (ref 3.5–5.1)
Sodium: 136 mEq/L (ref 135–145)
Total Bilirubin: 0.7 mg/dL (ref 0.2–1.2)
Total Protein: 7.2 g/dL (ref 6.0–8.3)

## 2021-03-06 LAB — CBC WITH DIFFERENTIAL/PLATELET
Basophils Absolute: 0 10*3/uL (ref 0.0–0.1)
Basophils Relative: 0.5 % (ref 0.0–3.0)
Eosinophils Absolute: 0.1 10*3/uL (ref 0.0–0.7)
Eosinophils Relative: 1.2 % (ref 0.0–5.0)
HCT: 32.2 % — ABNORMAL LOW (ref 39.0–52.0)
Hemoglobin: 10.9 g/dL — ABNORMAL LOW (ref 13.0–17.0)
Lymphocytes Relative: 30.4 % (ref 12.0–46.0)
Lymphs Abs: 1.4 10*3/uL (ref 0.7–4.0)
MCHC: 34 g/dL (ref 30.0–36.0)
MCV: 86.1 fl (ref 78.0–100.0)
Monocytes Absolute: 0.4 10*3/uL (ref 0.1–1.0)
Monocytes Relative: 8.6 % (ref 3.0–12.0)
Neutro Abs: 2.7 10*3/uL (ref 1.4–7.7)
Neutrophils Relative %: 59.3 % (ref 43.0–77.0)
Platelets: 154 10*3/uL (ref 150.0–400.0)
RBC: 3.74 Mil/uL — ABNORMAL LOW (ref 4.22–5.81)
RDW: 15.2 % (ref 11.5–15.5)
WBC: 4.6 10*3/uL (ref 4.0–10.5)

## 2021-03-06 LAB — LIPID PANEL
Cholesterol: 147 mg/dL (ref 0–200)
HDL: 77.3 mg/dL (ref >39.00)
LDL Cholesterol: 57 mg/dL (ref 0–99)
NonHDL: 69.91
Total CHOL/HDL Ratio: 2
Triglycerides: 63 mg/dL (ref 0.0–149.0)
VLDL: 12.6 mg/dL (ref 0.0–40.0)

## 2021-03-06 LAB — HEMOGLOBIN A1C: Hgb A1c MFr Bld: 6.3 % (ref 4.6–6.5)

## 2021-03-10 ENCOUNTER — Other Ambulatory Visit: Payer: Self-pay | Admitting: Family Medicine

## 2021-03-10 DIAGNOSIS — D61818 Other pancytopenia: Secondary | ICD-10-CM

## 2021-03-11 ENCOUNTER — Ambulatory Visit (INDEPENDENT_AMBULATORY_CARE_PROVIDER_SITE_OTHER): Payer: Medicare HMO | Admitting: Family Medicine

## 2021-03-11 ENCOUNTER — Other Ambulatory Visit: Payer: Self-pay

## 2021-03-11 ENCOUNTER — Encounter: Payer: Self-pay | Admitting: Family Medicine

## 2021-03-11 VITALS — BP 130/62 | HR 57 | Temp 97.7°F | Ht 72.0 in | Wt 261.0 lb

## 2021-03-11 DIAGNOSIS — I10 Essential (primary) hypertension: Secondary | ICD-10-CM

## 2021-03-11 DIAGNOSIS — E7800 Pure hypercholesterolemia, unspecified: Secondary | ICD-10-CM | POA: Diagnosis not present

## 2021-03-11 DIAGNOSIS — E113299 Type 2 diabetes mellitus with mild nonproliferative diabetic retinopathy without macular edema, unspecified eye: Secondary | ICD-10-CM

## 2021-03-11 DIAGNOSIS — Z23 Encounter for immunization: Secondary | ICD-10-CM

## 2021-03-11 DIAGNOSIS — D61818 Other pancytopenia: Secondary | ICD-10-CM | POA: Diagnosis not present

## 2021-03-11 DIAGNOSIS — Z7189 Other specified counseling: Secondary | ICD-10-CM

## 2021-03-11 DIAGNOSIS — Z0000 Encounter for general adult medical examination without abnormal findings: Secondary | ICD-10-CM

## 2021-03-11 MED ORDER — METFORMIN HCL 850 MG PO TABS: 850.0000 mg | ORAL_TABLET | Freq: Two times a day (BID) | ORAL | 3 refills | Status: DC

## 2021-03-11 MED ORDER — GLIMEPIRIDE 2 MG PO TABS: ORAL_TABLET | ORAL | 3 refills | Status: DC

## 2021-03-11 MED ORDER — PIOGLITAZONE HCL 45 MG PO TABS: 45.0000 mg | ORAL_TABLET | Freq: Every day | ORAL | 3 refills | Status: DC

## 2021-03-11 MED ORDER — HYDROCHLOROTHIAZIDE 12.5 MG PO TABS: 12.5000 mg | ORAL_TABLET | Freq: Every day | ORAL | 3 refills | Status: DC

## 2021-03-11 MED ORDER — ATORVASTATIN CALCIUM 10 MG PO TABS: 10.0000 mg | ORAL_TABLET | Freq: Every day | ORAL | 3 refills | Status: DC

## 2021-03-11 MED ORDER — RAMIPRIL 5 MG PO CAPS: 5.0000 mg | ORAL_CAPSULE | Freq: Every day | ORAL | 3 refills | Status: DC

## 2021-03-11 NOTE — Progress Notes (Signed)
This visit occurred during the SARS-CoV-2 public health emergency.  Safety protocols were in place, including screening questions prior to the visit, additional usage of staff PPE, and extensive cleaning of exam room while observing appropriate contact time as indicated for disinfecting solutions.  Diabetes:  Using medications without difficulties: yes Hypoglycemic episodes: very rare, down to 63 once, cautions d/w pt.   Hyperglycemic episodes: no Feet problems: no  Blood Sugars averaging: usually ~100 eye exam within last year: yes, no recent eye injections.    H/o pancytopenia.  SPEP d/w pt.  A poorly-defined band of restricted protein mobility is detected   in the gamma globulins. It is unlikely that this may represent a monoclonal protein; however, immunofixation analysis is available if clinically indicated. I checked with lab about adding in immunofixation.    He is still working with broadcasting, d/w pt.      Hypertension:    Using medication without problems or lightheadedness: yes Chest pain with exertion:no Edema: controlled/resolved with HCTZ.   Short of breath:no Labs d/w pt. Cr slightly worse, d/w pt.  Could be from HCTZ use.  D/w pt about avoiding nsaids and we can recheck periodically.    Prev leg sx resolved.  Only old scarring now, healed o/w.    Elevated Cholesterol: Using medications without problems:yes Muscle aches: no Diet compliance: yes Exercise: yes  Flu 2022 Shingrix d/w pt.  PNA up to date.  Tetanus 2011 Covid vaccine prev done.   Prostate cancer screening and PSA options (with potential risks and benefits of testing vs not testing) were discussed along with recent recs/guidelines.  He declined testing PSA at this point. Colonoscopy 2021 Advance directive -wife designated if patient were incapacitated  PMH and SH reviewed Meds, vitals, and allergies reviewed.   ROS: Per HPI unless specifically indicated in ROS section   GEN: nad, alert and  oriented HEENT: ncat NECK: supple w/o LA CV: rrr. PULM: ctab, no inc wob ABD: soft, +bs EXT: no edema SKIN: no acute rash  Diabetic foot exam: Normal inspection No skin breakdown No calluses  Normal DP pulses Normal sensation to light touch and monofilament except for mild dec in sensation on L foot from prior L leg injury Nails mildly thickened.

## 2021-03-11 NOTE — Patient Instructions (Addendum)
If you have any more low sugars then stop glimepiride. Update me as needed.   Colonoscopy due 2024.    SPEP (test for MGUS) had a mildly abnormal protein that likely isn't an issue but I want to get the follow up test done.   If abnormal- we'll address.  If normal- check routine SPEP in 1 year.    Plan on recheck A1c and BMET in about 6 months- you need a lab visit prior to the office visit if possible.    Take care.  Glad to see you.

## 2021-03-15 LAB — IMMUNOFIXATION ELECTROPHORESIS
IgG (Immunoglobin G), Serum: 1063 mg/dL (ref 600–1540)
IgM, Serum: 82 mg/dL (ref 50–300)
Immunoglobulin A: 196 mg/dL (ref 70–320)

## 2021-03-15 LAB — PROTEIN ELECTROPHORESIS, SERUM
Albumin ELP: 4 g/dL (ref 3.8–4.8)
Alpha 1: 0.4 g/dL — ABNORMAL HIGH (ref 0.2–0.3)
Alpha 2: 0.8 g/dL (ref 0.5–0.9)
Beta 2: 0.3 g/dL (ref 0.2–0.5)
Beta Globulin: 0.5 g/dL (ref 0.4–0.6)
Gamma Globulin: 0.9 g/dL (ref 0.8–1.7)
Total Protein: 6.9 g/dL (ref 6.1–8.1)

## 2021-03-15 LAB — TEST AUTHORIZATION

## 2021-03-17 ENCOUNTER — Other Ambulatory Visit: Payer: Self-pay | Admitting: Family Medicine

## 2021-03-17 DIAGNOSIS — D61818 Other pancytopenia: Secondary | ICD-10-CM

## 2021-03-17 NOTE — Assessment & Plan Note (Signed)
Continue niacin and atorvastatin.  He agrees to plan.

## 2021-03-17 NOTE — Assessment & Plan Note (Signed)
Flu 2022 Shingrix d/w pt.  PNA up to date.  Tetanus 2011 Covid vaccine prev done.   Prostate cancer screening and PSA options (with potential risks and benefits of testing vs not testing) were discussed along with recent recs/guidelines.  He declined testing PSA at this point. Colonoscopy 2021 Advance directive -wife designated if patient were incapacitated

## 2021-03-17 NOTE — Assessment & Plan Note (Signed)
A1c significantly better.  I thanked him for his effort.  Continue his current medications but if he has any low sugars and stop glimepiride.  Continue metformin and Actos in the meantime.

## 2021-03-17 NOTE — Assessment & Plan Note (Signed)
Continue hydrochlorothiazide and ramipril for now  Labs d/w pt. Cr slightly worse, d/w pt.  Could be from HCTZ use.  D/w pt about avoiding nsaids and we can recheck periodically.  He agrees with plan.

## 2021-03-17 NOTE — Assessment & Plan Note (Signed)
Advance directive- wife designated if patient were incapacitated.  

## 2021-03-17 NOTE — Assessment & Plan Note (Signed)
H/o pancytopenia.  SPEP d/w pt.  A poorly-defined band of restricted protein mobility is detected   in the gamma globulins. It is unlikely that this may represent a monoclonal protein; however, immunofixation analysis is available if clinically indicated. I checked with lab about adding in immunofixation.   We will get immunofixation done and then update the patient.  He agrees with plan.

## 2021-03-25 ENCOUNTER — Telehealth: Payer: Self-pay

## 2021-03-25 NOTE — Progress Notes (Signed)
Chronic Care Management Pharmacy Assistant   Name: Dennis Wise  MRN: [id number] DOB: 1951/03/03  Reason for Encounter: CCM (Hypertension Disease State)   Recent office visits:  03/11/2021 - Elsie Stain, MD - Patient presented for medicare wellness. Labs: BMP and A1c. Stop due to patient not taking: doxycycline (VIBRA-TABS) 100 MG tablet.  03/05/2021 - Orrin Brigham, LPN - Patient presented for Annual Wellness Visit. No medication changes.   Recent consult visits:  None since last CCM contact  Hospital visits:  None in previous 6 months  Medications: Outpatient Encounter Medications as of 03/25/2021  Medication Sig   ACCU-CHEK FASTCLIX LANCETS MISC Use to test blood sugar once daily or as needed.  Diagnosis:  E11.3299  Non insulin dependent.   aspirin 81 MG tablet Take 81 mg by mouth daily.   atorvastatin (LIPITOR) 10 MG tablet Take 1 tablet (10 mg total) by mouth at bedtime.   Blood Glucose Monitoring Suppl (ACCU-CHEK NANO SMARTVIEW) w/Device KIT Use to check blood sugar once daily or as needed.  Diagnosis: E11.3299  Non insulin dependent.   glimepiride (AMARYL) 2 MG tablet 1 tab a day   glucose blood (ACCU-CHEK SMARTVIEW) test strip Use as instructed to test blood sugar once daily or as needed.  Diagnosis:  E11.3299  Non insulin dependent.   hydrochlorothiazide (HYDRODIURIL) 12.5 MG tablet Take 1 tablet (12.5 mg total) by mouth daily.   metFORMIN (GLUCOPHAGE) 850 MG tablet Take 1 tablet (850 mg total) by mouth 2 (two) times daily.   Multiple Vitamin (MULTIVITAMIN) tablet Take 1 tablet by mouth daily. Centrum Silver   niacin (NIASPAN) 500 MG CR tablet TAKE 3 TABLETS AT BEDTIME   pioglitazone (ACTOS) 45 MG tablet Take 1 tablet (45 mg total) by mouth daily.   ramipril (ALTACE) 5 MG capsule Take 1 capsule (5 mg total) by mouth daily.   No facility-administered encounter medications on file as of 03/25/2021.   Recent Office Vitals: BP Readings from Last 3 Encounters:  03/11/21  130/62  01/16/21 (!) 158/62  09/10/20 124/68   Pulse Readings from Last 3 Encounters:  03/11/21 (!) 57  01/16/21 60  09/10/20 69    Wt Readings from Last 3 Encounters:  03/11/21 261 lb (118.4 kg)  03/05/21 260 lb (117.9 kg)  01/16/21 276 lb 4 oz (125.3 kg)    Kidney Function Lab Results  Component Value Date/Time   CREATININE 1.47 03/06/2021 09:38 AM   CREATININE 1.34 02/23/2020 07:59 AM   CREATININE 1.2 01/28/2016 08:04 AM   CREATININE 1.0 01/29/2015 10:34 AM   GFR 47.30 (L) 03/06/2021 09:38 AM   GFRNONAA 92.28 10/16/2009 08:43 AM   GFRAA 111 08/24/2007 10:15 AM   BMP Latest Ref Rng & Units 03/06/2021 02/23/2020 02/22/2019  Glucose 70 - 99 mg/dL 110(H) 140(H) 182(H)  BUN 6 - 23 mg/dL 32(H) 29(H) 21  Creatinine 0.40 - 1.50 mg/dL 1.47 1.34 1.11  Sodium 135 - 145 mEq/L 136 138 139  Potassium 3.5 - 5.1 mEq/L 5.2 No hemolysis seen(H) 4.8 4.3  Chloride 96 - 112 mEq/L 101 101 102  CO2 19 - 32 mEq/L _0 Calcium 8.4 - 10.5 mg/dL 9.6 9.0 9.0   Attempted contact with patient 3 times on 12/05, 12/08 and 12/12. Unsuccessful outreach. Will attempt contact next month.   Current antihypertensive regimen:  HCTZ 12.5 mg - Take 1 tablet by mouth daily.  Ramipril 5 mg - Take 1 capsule by mouth daily.     Adherence Review: Is  the patient currently on ACE/ARB medication? Yes Does the patient have >5 day gap between last estimated fill dates? No  Star Rating Drugs:  Medication:  Last Fill: Day Supply Atorvastatin 27m       03/11/2021     90 Glimepiride 247m         03/11/2021 90 Metformin 85026m      03/11/2021     90 Actos 59m61m              03/11/2021     90 Ramipril 5mg 48m           03/11/2021     90 Fill dates verified with [facility]: Annual wellness visit in last year? Yes 03/05/2021 Most Recent BP reading: 130/62 on 03/11/2021  If Diabetic: Most recent A1C reading: 6.3 on 03/06/2021 Last eye exam / retinopathy screening: Up to date Last  diabetic foot exam: Up to date  Oncology appointment on 04/03/2021  MicheDebbora Dus notified  Chianne Byrns MMarijean Niemann CPerryvillestant [phone]

## 2021-03-26 ENCOUNTER — Inpatient Hospital Stay: Payer: Medicare HMO | Admitting: Oncology

## 2021-03-26 ENCOUNTER — Inpatient Hospital Stay: Payer: Medicare HMO

## 2021-04-01 ENCOUNTER — Encounter: Payer: Self-pay | Admitting: *Deleted

## 2021-04-03 ENCOUNTER — Inpatient Hospital Stay (HOSPITAL_BASED_OUTPATIENT_CLINIC_OR_DEPARTMENT_OTHER): Payer: Medicare HMO | Admitting: Oncology

## 2021-04-03 ENCOUNTER — Encounter: Payer: Self-pay | Admitting: Oncology

## 2021-04-03 ENCOUNTER — Other Ambulatory Visit: Payer: Self-pay

## 2021-04-03 ENCOUNTER — Inpatient Hospital Stay: Payer: Medicare HMO | Attending: Oncology | Admitting: Oncology

## 2021-04-03 VITALS — BP 176/71 | HR 60 | Temp 96.0°F | Wt 279.5 lb

## 2021-04-03 DIAGNOSIS — D649 Anemia, unspecified: Secondary | ICD-10-CM

## 2021-04-03 DIAGNOSIS — Z8249 Family history of ischemic heart disease and other diseases of the circulatory system: Secondary | ICD-10-CM | POA: Diagnosis not present

## 2021-04-03 DIAGNOSIS — R5382 Chronic fatigue, unspecified: Secondary | ICD-10-CM | POA: Insufficient documentation

## 2021-04-03 DIAGNOSIS — D696 Thrombocytopenia, unspecified: Secondary | ICD-10-CM

## 2021-04-03 DIAGNOSIS — Z809 Family history of malignant neoplasm, unspecified: Secondary | ICD-10-CM | POA: Insufficient documentation

## 2021-04-03 DIAGNOSIS — I10 Essential (primary) hypertension: Secondary | ICD-10-CM | POA: Diagnosis not present

## 2021-04-03 DIAGNOSIS — Z79899 Other long term (current) drug therapy: Secondary | ICD-10-CM | POA: Diagnosis not present

## 2021-04-03 DIAGNOSIS — Z808 Family history of malignant neoplasm of other organs or systems: Secondary | ICD-10-CM | POA: Insufficient documentation

## 2021-04-03 DIAGNOSIS — D472 Monoclonal gammopathy: Secondary | ICD-10-CM

## 2021-04-03 DIAGNOSIS — Z836 Family history of other diseases of the respiratory system: Secondary | ICD-10-CM | POA: Diagnosis not present

## 2021-04-03 DIAGNOSIS — I872 Venous insufficiency (chronic) (peripheral): Secondary | ICD-10-CM | POA: Insufficient documentation

## 2021-04-03 DIAGNOSIS — Z8719 Personal history of other diseases of the digestive system: Secondary | ICD-10-CM | POA: Insufficient documentation

## 2021-04-03 DIAGNOSIS — Z818 Family history of other mental and behavioral disorders: Secondary | ICD-10-CM | POA: Insufficient documentation

## 2021-04-03 DIAGNOSIS — R768 Other specified abnormal immunological findings in serum: Secondary | ICD-10-CM

## 2021-04-03 DIAGNOSIS — Z833 Family history of diabetes mellitus: Secondary | ICD-10-CM | POA: Diagnosis not present

## 2021-04-03 DIAGNOSIS — R748 Abnormal levels of other serum enzymes: Secondary | ICD-10-CM | POA: Insufficient documentation

## 2021-04-03 DIAGNOSIS — D61818 Other pancytopenia: Secondary | ICD-10-CM | POA: Diagnosis not present

## 2021-04-03 DIAGNOSIS — E119 Type 2 diabetes mellitus without complications: Secondary | ICD-10-CM | POA: Diagnosis not present

## 2021-04-03 HISTORY — DX: Anemia, unspecified: D64.9

## 2021-04-03 LAB — COMPREHENSIVE METABOLIC PANEL
ALT: 44 U/L (ref 0–44)
AST: 57 U/L — ABNORMAL HIGH (ref 15–41)
Albumin: 4 g/dL (ref 3.5–5.0)
Alkaline Phosphatase: 146 U/L — ABNORMAL HIGH (ref 38–126)
Anion gap: 10 (ref 5–15)
BUN: 36 mg/dL — ABNORMAL HIGH (ref 8–23)
CO2: 24 mmol/L (ref 22–32)
Calcium: 8.8 mg/dL — ABNORMAL LOW (ref 8.9–10.3)
Chloride: 102 mmol/L (ref 98–111)
Creatinine, Ser: 1.29 mg/dL — ABNORMAL HIGH (ref 0.61–1.24)
GFR, Estimated: 59 mL/min — ABNORMAL LOW (ref >60)
Glucose, Bld: 184 mg/dL — ABNORMAL HIGH (ref 70–99)
Potassium: 4.5 mmol/L (ref 3.5–5.1)
Sodium: 136 mmol/L (ref 135–145)
Total Bilirubin: 1.1 mg/dL (ref 0.3–1.2)
Total Protein: 6.9 g/dL (ref 6.5–8.1)

## 2021-04-03 LAB — CBC WITH DIFFERENTIAL/PLATELET
Abs Immature Granulocytes: 0.01 10*3/uL (ref 0.00–0.07)
Basophils Absolute: 0 10*3/uL (ref 0.0–0.1)
Basophils Relative: 1 %
Eosinophils Absolute: 0.1 10*3/uL (ref 0.0–0.5)
Eosinophils Relative: 2 %
HCT: 31.7 % — ABNORMAL LOW (ref 39.0–52.0)
Hemoglobin: 10.3 g/dL — ABNORMAL LOW (ref 13.0–17.0)
Immature Granulocytes: 0 %
Lymphocytes Relative: 15 %
Lymphs Abs: 0.7 10*3/uL (ref 0.7–4.0)
MCH: 30.1 pg (ref 26.0–34.0)
MCHC: 32.5 g/dL (ref 30.0–36.0)
MCV: 92.7 fL (ref 80.0–100.0)
Monocytes Absolute: 0.3 10*3/uL (ref 0.1–1.0)
Monocytes Relative: 7 %
Neutro Abs: 3.2 10*3/uL (ref 1.7–7.7)
Neutrophils Relative %: 75 %
Platelets: 80 10*3/uL — ABNORMAL LOW (ref 150–400)
RBC: 3.42 MIL/uL — ABNORMAL LOW (ref 4.22–5.81)
RDW: 16.3 % — ABNORMAL HIGH (ref 11.5–15.5)
WBC: 4.3 10*3/uL (ref 4.0–10.5)
nRBC: 0 % (ref 0.0–0.2)

## 2021-04-03 LAB — IRON AND TIBC
Iron: 72 ug/dL (ref 45–182)
Saturation Ratios: 21 % (ref 17.9–39.5)
TIBC: 342 ug/dL (ref 250–450)
UIBC: 270 ug/dL

## 2021-04-03 LAB — TSH: TSH: 3.531 u[IU]/mL (ref 0.350–4.500)

## 2021-04-03 LAB — RETIC PANEL
Immature Retic Fract: 12.9 % (ref 2.3–15.9)
RBC.: 3.38 MIL/uL — ABNORMAL LOW (ref 4.22–5.81)
Retic Count, Absolute: 78.4 10*3/uL (ref 19.0–186.0)
Retic Ct Pct: 2.3 % (ref 0.4–3.1)
Reticulocyte Hemoglobin: 32.9 pg (ref >27.9)

## 2021-04-03 LAB — TECHNOLOGIST SMEAR REVIEW
Plt Morphology: NORMAL
RBC MORPHOLOGY: NORMAL
WBC MORPHOLOGY: NORMAL

## 2021-04-03 LAB — FERRITIN: Ferritin: 83 ng/mL (ref 24–336)

## 2021-04-03 LAB — FOLATE: Folate: 25 ng/mL (ref >5.9)

## 2021-04-03 LAB — VITAMIN B12: Vitamin B-12: 544 pg/mL (ref 180–914)

## 2021-04-03 NOTE — Addendum Note (Signed)
Addended by: Earlie Server on: 04/03/2021 08:40 PM   Modules accepted: Orders

## 2021-04-03 NOTE — Progress Notes (Signed)
Hematology/Oncology Consult note [phone] [phone]      Patient Care Team: [name], MD as PCP - General (Family Medicine) Gearlean Alf, MD as Referring Physician (Ophthalmology) Nicholas Lose, MD as Consulting Physician (Hematology and Oncology) [name], [facility] as Pharmacist (Pharmacist)  REFERRING PROVIDER: [name], MD  CHIEF COMPLAINTS/REASON FOR VISIT:  Evaluation of pancytopenia  HISTORY OF PRESENTING ILLNESS:   Dennis Wise is a  70 y.o.  male with PMH listed below was seen in consultation at the request of  [name], MD  for evaluation of pancytopenia  03/06/2021, patient had blood work done.  CBC showed normal WBC of 4.6, hemoglobin 10.9, normal platelet count at 154,000.  Chemistry labs showed normal creatinine at 1.47 with a GFR of 47.3, normal bilirubin.  Potassium was elevated at 5.2.  Patient reports a history of MGUS.  Initially diagnosed by oncology Dr. Humphrey Rolls. 04/16/2021, kappa light chain ratio was elevated at 1.67.  04/17/2011, SPEP showed no monoclonal protein. 04/25/2011 24-hour urine protein 578. 04/25/2011 patient had a bone marrow biopsy and aspirate which showed a 3% of polyclonal plasma cells staining for kappa and lambda light chain and lack of large aggregates or sheets.  Overall changes are nonspecific.  Patient L was seen by oncology Dr. Nicholas Lose who did not feel that patient meets diagnostic criteria for MGUS and the patient was discharged.  Patient reports feeling well except chronic fatigue.  Patient had history of left lower extremity surgery due to fracture and he has chronic venous insufficiency. Patient denies any constitutional symptoms. Review of Systems  Constitutional:  Positive for fatigue. Negative for appetite change, chills, fever and unexpected weight change.  HENT:   Negative for hearing loss and voice change.   Eyes:  Negative for eye problems and icterus.  Respiratory:   Negative for chest tightness, cough and shortness of breath.   Cardiovascular:  Negative for chest pain and leg swelling.  Gastrointestinal:  Negative for abdominal distention and abdominal pain.  Endocrine: Negative for hot flashes.  Genitourinary:  Negative for difficulty urinating, dysuria and frequency.   Musculoskeletal:  Negative for arthralgias.  Skin:  Negative for itching and rash.  Neurological:  Negative for light-headedness and numbness.  Hematological:  Negative for adenopathy. Does not bruise/bleed easily.  Psychiatric/Behavioral:  Negative for confusion.    MEDICAL HISTORY:  Past Medical History:  Diagnosis Date   Anemia    Iron deficiency part of this   Cataract    bilateral lens implants   Diabetes mellitus    type II   GERD (gastroesophageal reflux disease) 41/6606   Helicobacter pylori gastritis 06/11/2011   On EGD 05/2011    Hyperlipidemia    Hypertension 11/1999   Internal hemorrhoids    Iron deficiency anemia, unspecified 03/04/2011   MGUS (monoclonal gammopathy of unknown significance)    possible dx initially, had negative f/u   Pancytopenia    Pancytopenia, acquired ([place])    Personal history of colonic adenomas 06/05/2012   Pneumonia     SURGICAL HISTORY: Past Surgical History:  Procedure Laterality Date   CATARACT EXTRACTION Bilateral    COLONOSCOPY     FLEXIBLE SIGMOIDOSCOPY  05/1988   internal hemorrhoids   KNEE SURGERY  09/04   lt knee fx repair ORIF   TIBIA FRACTURE SURGERY     left 2012    SOCIAL HISTORY: Social History   Socioeconomic History   Marital status: Married    Spouse name: Not  on file   Number of children: Not on file   Years of education: Not on file   Highest education level: Not on file  Occupational History   Not on file  Tobacco Use   Smoking status: Never   Smokeless tobacco: Never  Vaping Use   Vaping Use: Never used  Substance and Sexual Activity   Alcohol use: Never   Drug use: No   Sexual activity:  Yes  Other Topics Concern   Not on file  Social History Narrative   Prev traveling for sports broadcasting    Working for [employer], [place] [employer] as of 2019   Married 1970   1 child   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   Social Determinants of Health   Financial Resource Strain: Low Risk    Difficulty of Paying Living Expenses: Not hard at all  Food Insecurity: No Food Insecurity   Worried About Charity fundraiser in the Last Year: Never true   [place] in the Last Year: Never true  Transportation Needs: No Transportation Needs   Lack of Transportation (Medical): No   Lack of Transportation (Non-Medical): No  Physical Activity: Unknown   Days of Exercise per Week: 7 days   Minutes of Exercise per Session: Not on file  Stress: No Stress Concern Present   Feeling of Stress : Not at all  Social Connections: Moderately Isolated   Frequency of Communication with Friends and Family: Twice a week   Frequency of Social Gatherings with Friends and Family: More than three times a week   Attends Religious Services: Never   Marine scientist or Organizations: No   Attends Music therapist: Never   Marital Status: Married  Human resources officer Violence: Not At Risk   Fear of Current or Ex-Partner: No   Emotionally Abused: No   Physically Abused: No   Sexually Abused: No    FAMILY HISTORY: Family History  Problem Relation Age of Onset   Cancer Mother        ?   Diabetes Mother    Heart failure Mother    Emphysema Father    Cancer Father        ?   Alzheimer's disease Father    Skin cancer Brother    Diabetes Brother    Colon cancer Neg Hx    Prostate cancer Neg Hx    Esophageal cancer Neg Hx    Rectal cancer Neg Hx    Stomach cancer Neg Hx     ALLERGIES:  is allergic to promethazine hcl and other.  MEDICATIONS:  Current Outpatient Medications  Medication Sig Dispense Refill   ACCU-CHEK FASTCLIX LANCETS MISC Use to test blood sugar once daily or as needed.   Diagnosis:  E11.3299  Non insulin dependent. 102 each 3   aspirin 81 MG tablet Take 81 mg by mouth daily.     atorvastatin (LIPITOR) 10 MG tablet Take 1 tablet (10 mg total) by mouth at bedtime. 90 tablet 3   Blood Glucose Monitoring Suppl (ACCU-CHEK NANO SMARTVIEW) w/Device KIT Use to check blood sugar once daily or as needed.  Diagnosis: E11.3299  Non insulin dependent. 1 kit 0   glimepiride (AMARYL) 2 MG tablet 1 tab a day 90 tablet 3   glucose blood (ACCU-CHEK SMARTVIEW) test strip Use as instructed to test blood sugar once daily or as needed.  Diagnosis:  E11.3299  Non insulin dependent. 100 each 3   hydrochlorothiazide (HYDRODIURIL) 12.5  MG tablet Take 1 tablet (12.5 mg total) by mouth daily. 90 tablet 3   metFORMIN (GLUCOPHAGE) 850 MG tablet Take 1 tablet (850 mg total) by mouth 2 (two) times daily. 180 tablet 3   Multiple Vitamin (MULTIVITAMIN) tablet Take 1 tablet by mouth daily. Centrum Silver     niacin (NIASPAN) 500 MG CR tablet TAKE 3 TABLETS AT BEDTIME 270 tablet 2   pioglitazone (ACTOS) 45 MG tablet Take 1 tablet (45 mg total) by mouth daily. 90 tablet 3   ramipril (ALTACE) 5 MG capsule Take 1 capsule (5 mg total) by mouth daily. 90 capsule 3   No current facility-administered medications for this visit.     PHYSICAL EXAMINATION: ECOG PERFORMANCE STATUS: 0 - Asymptomatic Vitals:   04/03/21 1116  BP: (!) 176/71  Pulse: 60  Temp: (!) 96 F (35.6 C)   Filed Weights   04/03/21 1116  Weight: 279 lb 8 oz (126.8 kg)    Physical Exam Constitutional:      General: He is not in acute distress. HENT:     Head: Normocephalic and atraumatic.  Eyes:     General: No scleral icterus. Cardiovascular:     Rate and Rhythm: Normal rate and regular rhythm.     Heart sounds: Normal heart sounds.  Pulmonary:     Effort: Pulmonary effort is normal. No respiratory distress.     Breath sounds: No wheezing.  Abdominal:     General: Bowel sounds are normal. There is no distension.      Palpations: Abdomen is soft.  Musculoskeletal:        General: No deformity. Normal range of motion.     Cervical back: Normal range of motion and neck supple.  Skin:    General: Skin is warm and dry.     Findings: No erythema or rash.  Neurological:     Mental Status: He is alert and oriented to person, place, and time. Mental status is at baseline.     Cranial Nerves: No cranial nerve deficit.     Coordination: Coordination normal.  Psychiatric:        Mood and Affect: Mood normal.    LABORATORY DATA:  I have reviewed the data as listed Lab Results  Component Value Date   WBC 4.3 04/03/2021   HGB 10.3 (L) 04/03/2021   HCT 31.7 (L) 04/03/2021   MCV 92.7 04/03/2021   PLT 80 (L) 04/03/2021   Recent Labs    03/06/21 0938 04/03/21 1200  NA 136 136  K 5.2 No hemolysis seen* 4.5  CL 101 102  CO2 26 24  GLUCOSE 110* 184*  BUN 32* 36*  CREATININE 1.47 1.29*  CALCIUM 9.6 8.8*  GFRNONAA  --  59*  PROT 6.9   7.2 6.9  ALBUMIN 4.3 4.0  AST 28 57*  ALT 21 44  ALKPHOS 104 146*  BILITOT 0.7 1.1   Iron/TIBC/Ferritin/ %Sat    Component Value Date/Time   IRON 72 04/03/2021 1200   IRON 112 07/11/2013 1019   TIBC 342 04/03/2021 1200   TIBC 336 07/11/2013 1019   FERRITIN 83 04/03/2021 1200   FERRITIN 39 07/11/2013 1019   IRONPCTSAT 21 04/03/2021 1200   IRONPCTSAT 33 07/11/2013 1019   IRONPCTSAT 35 04/17/2011 0922      RADIOGRAPHIC STUDIES: I have personally reviewed the radiological images as listed and agreed with the findings in the report. No results found.    ASSESSMENT & PLAN:  1. Normocytic anemia   2.  Elevated serum immunoglobulin free light chains   3. Thrombocytopenia (HCC)    #Normocytic anemia Check CBC, smear, iron TIBC ferritin, reticulocyte panel TSH, check B12 and folate.  #History of elevated free light chain and light chain ratio.  Check multiple myeloma panel, free light chain ratio, 24-hour urine protein electrophoresis.  #Today CBC showed  decreased hemoglobin at 10.3, platelet count 80,000.  Creatinine is 1.29 with a GFR of 59.  Slightly increased AST, normal ALT.  Elevated alkaline phosphatase.  Mildly decreased calcium.  Vitamin B12  544.  Ferritin 83, TIBC 342, iron saturation 21.  Reticulocyte panel showed reticulocyte hemoglobin 32.9.  Normal reticulocyte percentage and immature reticulocyte fraction. Will add immature platelet fraction.  Orders Placed This Encounter  Procedures   Iron and TIBC    Standing Status:   Future    Number of Occurrences:   1    Standing Expiration Date:   04/03/2022   Ferritin    Standing Status:   Future    Number of Occurrences:   1    Standing Expiration Date:   10/02/2021   Vitamin B12    Standing Status:   Future    Number of Occurrences:   1    Standing Expiration Date:   04/03/2022   Folate    Standing Status:   Future    Number of Occurrences:   1    Standing Expiration Date:   04/03/2022   Technologist smear review    Standing Status:   Future    Number of Occurrences:   1    Standing Expiration Date:   04/03/2022   Comprehensive metabolic panel    Standing Status:   Future    Number of Occurrences:   1    Standing Expiration Date:   04/03/2022   CBC with Differential/Platelet    Standing Status:   Future    Number of Occurrences:   1    Standing Expiration Date:   04/03/2022   Multiple Myeloma Panel (SPEP&IFE w/QIG)    Standing Status:   Future    Number of Occurrences:   1    Standing Expiration Date:   04/03/2022   Kappa/lambda light chains    Standing Status:   Future    Number of Occurrences:   1    Standing Expiration Date:   04/03/2022   Retic Panel    Standing Status:   Future    Number of Occurrences:   1    Standing Expiration Date:   04/03/2022   IFE+PROTEIN ELECTRO, 24-HR UR    Standing Status:   Future    Standing Expiration Date:   04/03/2022   TSH    Standing Status:   Future    Number of Occurrences:   1    Standing Expiration Date:    04/03/2022    All questions were answered. The patient knows to call the clinic with any problems questions or concerns.   [name], MD    Return of visit: Follow-up in 3 to 4 weeks to review results. Thank you for this kind referral and the opportunity to participate in the care of this patient. A copy of today's note is routed to referring provider   Earlie Server, MD, PhD [facility] Hematology Oncology 04/03/2021

## 2021-04-04 ENCOUNTER — Other Ambulatory Visit: Payer: Self-pay

## 2021-04-04 DIAGNOSIS — D696 Thrombocytopenia, unspecified: Secondary | ICD-10-CM

## 2021-04-04 LAB — KAPPA/LAMBDA LIGHT CHAINS
Kappa free light chain: 38 mg/L — ABNORMAL HIGH (ref 3.3–19.4)
Kappa, lambda light chain ratio: 1.98 — ABNORMAL HIGH (ref 0.26–1.65)
Lambda free light chains: 19.2 mg/L (ref 5.7–26.3)

## 2021-04-04 LAB — IMMATURE PLATELET FRACTION: Immature Platelet Fraction: 4.2 % (ref 1.2–8.6)

## 2021-04-04 NOTE — Progress Notes (Signed)
Sent message to lab requesting add on of lab

## 2021-04-08 ENCOUNTER — Telehealth: Payer: Self-pay

## 2021-04-08 ENCOUNTER — Other Ambulatory Visit: Payer: Self-pay

## 2021-04-08 DIAGNOSIS — D696 Thrombocytopenia, unspecified: Secondary | ICD-10-CM

## 2021-04-08 DIAGNOSIS — Z7984 Long term (current) use of oral hypoglycemic drugs: Secondary | ICD-10-CM | POA: Diagnosis not present

## 2021-04-08 DIAGNOSIS — E113513 Type 2 diabetes mellitus with proliferative diabetic retinopathy with macular edema, bilateral: Secondary | ICD-10-CM | POA: Diagnosis not present

## 2021-04-08 DIAGNOSIS — H02834 Dermatochalasis of left upper eyelid: Secondary | ICD-10-CM | POA: Diagnosis not present

## 2021-04-08 DIAGNOSIS — Z961 Presence of intraocular lens: Secondary | ICD-10-CM | POA: Diagnosis not present

## 2021-04-08 DIAGNOSIS — H02831 Dermatochalasis of right upper eyelid: Secondary | ICD-10-CM | POA: Diagnosis not present

## 2021-04-08 NOTE — Telephone Encounter (Signed)
-----   Message from Earlie Server, MD sent at 04/06/2021  4:09 PM EST ----- Please remind pt to turn in 24 hour urine collection testing.  Also I would like to also check peripheral flowcytrometry, reason is Thrombocytopenia.  Check US abdomen complete - reason is elevated LFT and thrombocytopenia.  Please arrange all tests done before his follow up. The urine test and flowcytometry 1 week before his follow up thanks.

## 2021-04-08 NOTE — Telephone Encounter (Signed)
Spoke to pt's wife and notified her of MD recommendations. She verbalized understanding. He will bring urine and have Flow cytometry the week prior to MD follow.

## 2021-04-10 LAB — MULTIPLE MYELOMA PANEL, SERUM
Albumin SerPl Elph-Mcnc: 3.7 g/dL (ref 2.9–4.4)
Albumin/Glob SerPl: 1.5 (ref 0.7–1.7)
Alpha 1: 0.3 g/dL (ref 0.0–0.4)
Alpha2 Glob SerPl Elph-Mcnc: 0.6 g/dL (ref 0.4–1.0)
B-Globulin SerPl Elph-Mcnc: 0.9 g/dL (ref 0.7–1.3)
Gamma Glob SerPl Elph-Mcnc: 0.8 g/dL (ref 0.4–1.8)
Globulin, Total: 2.5 g/dL (ref 2.2–3.9)
IgA: 168 mg/dL (ref 61–437)
IgG (Immunoglobin G), Serum: 873 mg/dL (ref 603–1613)
IgM (Immunoglobulin M), Srm: 59 mg/dL (ref 15–143)
Total Protein ELP: 6.2 g/dL (ref 6.0–8.5)

## 2021-04-22 DIAGNOSIS — Z836 Family history of other diseases of the respiratory system: Secondary | ICD-10-CM | POA: Diagnosis not present

## 2021-04-22 DIAGNOSIS — E119 Type 2 diabetes mellitus without complications: Secondary | ICD-10-CM | POA: Diagnosis not present

## 2021-04-22 DIAGNOSIS — R5382 Chronic fatigue, unspecified: Secondary | ICD-10-CM | POA: Insufficient documentation

## 2021-04-22 DIAGNOSIS — I10 Essential (primary) hypertension: Secondary | ICD-10-CM | POA: Diagnosis not present

## 2021-04-22 DIAGNOSIS — I872 Venous insufficiency (chronic) (peripheral): Secondary | ICD-10-CM | POA: Insufficient documentation

## 2021-04-22 DIAGNOSIS — K760 Fatty (change of) liver, not elsewhere classified: Secondary | ICD-10-CM | POA: Diagnosis not present

## 2021-04-22 DIAGNOSIS — Z809 Family history of malignant neoplasm, unspecified: Secondary | ICD-10-CM | POA: Diagnosis not present

## 2021-04-22 DIAGNOSIS — Z808 Family history of malignant neoplasm of other organs or systems: Secondary | ICD-10-CM | POA: Diagnosis not present

## 2021-04-22 DIAGNOSIS — D61818 Other pancytopenia: Secondary | ICD-10-CM | POA: Diagnosis not present

## 2021-04-22 DIAGNOSIS — D649 Anemia, unspecified: Secondary | ICD-10-CM | POA: Insufficient documentation

## 2021-04-22 DIAGNOSIS — Z833 Family history of diabetes mellitus: Secondary | ICD-10-CM | POA: Diagnosis not present

## 2021-04-22 DIAGNOSIS — K8020 Calculus of gallbladder without cholecystitis without obstruction: Secondary | ICD-10-CM | POA: Insufficient documentation

## 2021-04-22 DIAGNOSIS — Z8249 Family history of ischemic heart disease and other diseases of the circulatory system: Secondary | ICD-10-CM | POA: Insufficient documentation

## 2021-04-22 DIAGNOSIS — Z79899 Other long term (current) drug therapy: Secondary | ICD-10-CM | POA: Insufficient documentation

## 2021-04-22 DIAGNOSIS — Z8719 Personal history of other diseases of the digestive system: Secondary | ICD-10-CM | POA: Insufficient documentation

## 2021-04-22 DIAGNOSIS — J90 Pleural effusion, not elsewhere classified: Secondary | ICD-10-CM | POA: Diagnosis not present

## 2021-04-23 ENCOUNTER — Ambulatory Visit
Admission: RE | Admit: 2021-04-23 | Discharge: 2021-04-23 | Disposition: A | Discharge status: Home / Self Care | Payer: Medicare HMO | Source: Ambulatory Visit | Attending: Oncology | Admitting: Oncology

## 2021-04-23 ENCOUNTER — Other Ambulatory Visit: Payer: Self-pay

## 2021-04-23 ENCOUNTER — Inpatient Hospital Stay: Payer: Medicare HMO | Attending: Oncology

## 2021-04-23 DIAGNOSIS — D696 Thrombocytopenia, unspecified: Secondary | ICD-10-CM | POA: Insufficient documentation

## 2021-04-23 DIAGNOSIS — I10 Essential (primary) hypertension: Secondary | ICD-10-CM | POA: Diagnosis not present

## 2021-04-23 DIAGNOSIS — K760 Fatty (change of) liver, not elsewhere classified: Secondary | ICD-10-CM | POA: Diagnosis not present

## 2021-04-23 DIAGNOSIS — D649 Anemia, unspecified: Secondary | ICD-10-CM

## 2021-04-23 DIAGNOSIS — E119 Type 2 diabetes mellitus without complications: Secondary | ICD-10-CM | POA: Diagnosis not present

## 2021-04-23 DIAGNOSIS — R7989 Other specified abnormal findings of blood chemistry: Secondary | ICD-10-CM | POA: Diagnosis not present

## 2021-04-23 DIAGNOSIS — K8020 Calculus of gallbladder without cholecystitis without obstruction: Secondary | ICD-10-CM | POA: Diagnosis not present

## 2021-04-23 DIAGNOSIS — R768 Other specified abnormal immunological findings in serum: Secondary | ICD-10-CM

## 2021-04-23 DIAGNOSIS — I872 Venous insufficiency (chronic) (peripheral): Secondary | ICD-10-CM | POA: Diagnosis not present

## 2021-04-23 DIAGNOSIS — J90 Pleural effusion, not elsewhere classified: Secondary | ICD-10-CM | POA: Diagnosis not present

## 2021-04-23 DIAGNOSIS — D61818 Other pancytopenia: Secondary | ICD-10-CM | POA: Diagnosis not present

## 2021-04-23 DIAGNOSIS — R5382 Chronic fatigue, unspecified: Secondary | ICD-10-CM | POA: Diagnosis not present

## 2021-04-25 LAB — COMP PANEL: LEUKEMIA/LYMPHOMA: Immunophenotypic Profile: 0

## 2021-04-25 LAB — IFE+PROTEIN ELECTRO, 24-HR UR
% BETA, Urine: 13 %
ALPHA 1 URINE: 4.8 %
Albumin, U: 70.1 %
Alpha 2, Urine: 3.8 %
GAMMA GLOBULIN URINE: 8.3 %
Total Protein, Urine-Ur/day: 702 mg/24 hr — ABNORMAL HIGH (ref 30–150)
Total Protein, Urine: 23.4 mg/dL
Total Volume: 3000

## 2021-04-26 DIAGNOSIS — M545 Low back pain, unspecified: Secondary | ICD-10-CM | POA: Diagnosis not present

## 2021-05-02 ENCOUNTER — Inpatient Hospital Stay (HOSPITAL_BASED_OUTPATIENT_CLINIC_OR_DEPARTMENT_OTHER): Payer: Medicare HMO | Admitting: Oncology

## 2021-05-02 ENCOUNTER — Telehealth: Payer: Self-pay

## 2021-05-02 ENCOUNTER — Encounter: Payer: Self-pay | Admitting: Oncology

## 2021-05-02 ENCOUNTER — Other Ambulatory Visit: Payer: Self-pay

## 2021-05-02 VITALS — BP 175/67 | HR 63 | Temp 96.6°F | Resp 18 | Wt 275.4 lb

## 2021-05-02 DIAGNOSIS — D696 Thrombocytopenia, unspecified: Secondary | ICD-10-CM

## 2021-05-02 DIAGNOSIS — D61818 Other pancytopenia: Secondary | ICD-10-CM | POA: Diagnosis not present

## 2021-05-02 DIAGNOSIS — J90 Pleural effusion, not elsewhere classified: Secondary | ICD-10-CM | POA: Diagnosis not present

## 2021-05-02 DIAGNOSIS — D649 Anemia, unspecified: Secondary | ICD-10-CM | POA: Diagnosis not present

## 2021-05-02 DIAGNOSIS — I10 Essential (primary) hypertension: Secondary | ICD-10-CM | POA: Diagnosis not present

## 2021-05-02 DIAGNOSIS — K8020 Calculus of gallbladder without cholecystitis without obstruction: Secondary | ICD-10-CM | POA: Diagnosis not present

## 2021-05-02 DIAGNOSIS — I872 Venous insufficiency (chronic) (peripheral): Secondary | ICD-10-CM | POA: Diagnosis not present

## 2021-05-02 DIAGNOSIS — R5382 Chronic fatigue, unspecified: Secondary | ICD-10-CM | POA: Diagnosis not present

## 2021-05-02 DIAGNOSIS — K760 Fatty (change of) liver, not elsewhere classified: Secondary | ICD-10-CM | POA: Diagnosis not present

## 2021-05-02 DIAGNOSIS — E119 Type 2 diabetes mellitus without complications: Secondary | ICD-10-CM | POA: Diagnosis not present

## 2021-05-02 NOTE — Telephone Encounter (Signed)
BMB ordered and form faxed to specialty scheduling to get Dennis Wise scheduled for BMB. Will contact patient to inform of appt time once scheduled.

## 2021-05-02 NOTE — Telephone Encounter (Signed)
Will schedule patient for f/u 2 weeks after bmb

## 2021-05-03 DIAGNOSIS — D696 Thrombocytopenia, unspecified: Secondary | ICD-10-CM

## 2021-05-03 HISTORY — DX: Thrombocytopenia, unspecified: D69.6

## 2021-05-03 NOTE — Progress Notes (Signed)
Hematology/Oncology Consult note [phone] [phone]      Patient Care Team: [name], MD as PCP - General (Family Medicine) Gearlean Alf, MD as Referring Physician (Ophthalmology) Nicholas Lose, MD as Consulting Physician (Hematology and Oncology) [name], [facility] as Pharmacist (Pharmacist)  REFERRING PROVIDER: [name], MD  CHIEF COMPLAINTS/REASON FOR VISIT:  pancytopenia  HISTORY OF PRESENTING ILLNESS:   Dennis Wise is a  71 y.o.  male with PMH listed below was seen in consultation at the request of  [name], MD  for evaluation of pancytopenia  03/06/2021, patient had blood work done.  CBC showed normal WBC of 4.6, hemoglobin 10.9, normal platelet count at 154,000.  Chemistry labs showed normal creatinine at 1.47 with a GFR of 47.3, normal bilirubin.  Potassium was elevated at 5.2.  Patient reports a history of MGUS.  Initially diagnosed by oncology Dr. Humphrey Rolls. 04/16/2021, kappa light chain ratio was elevated at 1.67.  04/17/2011, SPEP showed no monoclonal protein. 04/25/2011 24-hour urine protein 578. 04/25/2011 patient had a bone marrow biopsy and aspirate which showed a 3% of polyclonal plasma cells staining for kappa and lambda light chain and lack of large aggregates or sheets.  Overall changes are nonspecific.  Patient L was seen by oncology Dr. Nicholas Lose who did not feel that patient meets diagnostic criteria for MGUS and the patient was discharged.  Patient reports feeling well except chronic fatigue.  Patient had history of left lower extremity surgery due to fracture and he has chronic venous insufficiency. Patient denies any constitutional symptoms.   INTERVAL HISTORY Dennis Wise is a 71 y.o. male who has above history reviewed by me today presents for follow up visit for management of pancytopenia.  Patient presents to discuss lab work up results. No new complaints.   Review of Systems  Constitutional:   Positive for fatigue. Negative for appetite change, chills, fever and unexpected weight change.  HENT:   Negative for hearing loss and voice change.   Eyes:  Negative for eye problems and icterus.  Respiratory:  Negative for chest tightness, cough and shortness of breath.   Cardiovascular:  Negative for chest pain and leg swelling.  Gastrointestinal:  Negative for abdominal distention and abdominal pain.  Endocrine: Negative for hot flashes.  Genitourinary:  Negative for difficulty urinating, dysuria and frequency.   Musculoskeletal:  Negative for arthralgias.  Skin:  Negative for itching and rash.  Neurological:  Negative for light-headedness and numbness.  Hematological:  Negative for adenopathy. Does not bruise/bleed easily.  Psychiatric/Behavioral:  Negative for confusion.    MEDICAL HISTORY:  Past Medical History:  Diagnosis Date   Anemia    Iron deficiency part of this   Cataract    bilateral lens implants   Diabetes mellitus    type II   GERD (gastroesophageal reflux disease) 10/3708   Helicobacter pylori gastritis 06/11/2011   On EGD 05/2011    Hyperlipidemia    Hypertension 11/1999   Internal hemorrhoids    Iron deficiency anemia, unspecified 03/04/2011   MGUS (monoclonal gammopathy of unknown significance)    possible dx initially, had negative f/u   Pancytopenia    Pancytopenia, acquired ([place])    Personal history of colonic adenomas 06/05/2012   Pneumonia     SURGICAL HISTORY: Past Surgical History:  Procedure Laterality Date   CATARACT EXTRACTION Bilateral    COLONOSCOPY     FLEXIBLE SIGMOIDOSCOPY  05/1988   internal hemorrhoids   KNEE SURGERY  09/04   lt knee fx repair ORIF   TIBIA FRACTURE SURGERY     left 2012    SOCIAL HISTORY: Social History   Socioeconomic History   Marital status: Married    Spouse name: Not on file   Number of children: Not on file   Years of education: Not on file   Highest education level: Not on file  Occupational  History   Not on file  Tobacco Use   Smoking status: Never   Smokeless tobacco: Never  Vaping Use   Vaping Use: Never used  Substance and Sexual Activity   Alcohol use: Never   Drug use: No   Sexual activity: Yes  Other Topics Concern   Not on file  Social History Narrative   Prev traveling for sports broadcasting    Working for [employer], [employer] etc as of 2019   Married 1970   1 child   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   Social Determinants of Health   Financial Resource Strain: Low Risk    Difficulty of Paying Living Expenses: Not hard at all  Food Insecurity: No Food Insecurity   Worried About Charity fundraiser in the Last Year: Never true   [place] in the Last Year: Never true  Transportation Needs: No Transportation Needs   Lack of Transportation (Medical): No   Lack of Transportation (Non-Medical): No  Physical Activity: Unknown   Days of Exercise per Week: 7 days   Minutes of Exercise per Session: Not on file  Stress: No Stress Concern Present   Feeling of Stress : Not at all  Social Connections: Moderately Isolated   Frequency of Communication with Friends and Family: Twice a week   Frequency of Social Gatherings with Friends and Family: More than three times a week   Attends Religious Services: Never   Marine scientist or Organizations: No   Attends Music therapist: Never   Marital Status: Married  Human resources officer Violence: Not At Risk   Fear of Current or Ex-Partner: No   Emotionally Abused: No   Physically Abused: No   Sexually Abused: No    FAMILY HISTORY: Family History  Problem Relation Age of Onset   Cancer Mother        ?   Diabetes Mother    Heart failure Mother    Emphysema Father    Cancer Father        ?   Alzheimer's disease Father    Skin cancer Brother    Diabetes Brother    Colon cancer Neg Hx    Prostate cancer Neg Hx    Esophageal cancer Neg Hx    Rectal cancer Neg Hx    Stomach cancer Neg Hx      ALLERGIES:  is allergic to promethazine hcl and other.  MEDICATIONS:  Current Outpatient Medications  Medication Sig Dispense Refill   ACCU-CHEK FASTCLIX LANCETS MISC Use to test blood sugar once daily or as needed.  Diagnosis:  E11.3299  Non insulin dependent. 102 each 3   aspirin 81 MG tablet Take 81 mg by mouth daily.     atorvastatin (LIPITOR) 10 MG tablet Take 1 tablet (10 mg total) by mouth at bedtime. 90 tablet 3   Blood Glucose Monitoring Suppl (ACCU-CHEK NANO SMARTVIEW) w/Device KIT Use to check blood sugar once daily or as needed.  Diagnosis: E11.3299  Non insulin dependent. 1 kit 0   glimepiride (AMARYL) 2 MG tablet 1 tab  a day 90 tablet 3   glucose blood (ACCU-CHEK SMARTVIEW) test strip Use as instructed to test blood sugar once daily or as needed.  Diagnosis:  E11.3299  Non insulin dependent. 100 each 3   hydrochlorothiazide (HYDRODIURIL) 12.5 MG tablet Take 1 tablet (12.5 mg total) by mouth daily. 90 tablet 3   metFORMIN (GLUCOPHAGE) 850 MG tablet Take 1 tablet (850 mg total) by mouth 2 (two) times daily. 180 tablet 3   Multiple Vitamin (MULTIVITAMIN) tablet Take 1 tablet by mouth daily. Centrum Silver     niacin (NIASPAN) 500 MG CR tablet TAKE 3 TABLETS AT BEDTIME 270 tablet 2   pioglitazone (ACTOS) 45 MG tablet Take 1 tablet (45 mg total) by mouth daily. 90 tablet 3   ramipril (ALTACE) 5 MG capsule Take 1 capsule (5 mg total) by mouth daily. 90 capsule 3   No current facility-administered medications for this visit.     PHYSICAL EXAMINATION: ECOG PERFORMANCE STATUS: 0 - Asymptomatic Vitals:   05/02/21 1124  BP: (!) 175/67  Pulse: 63  Resp: 18  Temp: (!) 96.6 F (35.9 C)   Filed Weights   05/02/21 1124  Weight: 275 lb 6.4 oz (124.9 kg)    Physical Exam Constitutional:      General: He is not in acute distress. HENT:     Head: Normocephalic and atraumatic.  Eyes:     General: No scleral icterus. Cardiovascular:     Rate and Rhythm: Normal rate and  regular rhythm.     Heart sounds: Normal heart sounds.  Pulmonary:     Effort: Pulmonary effort is normal. No respiratory distress.     Breath sounds: No wheezing.  Abdominal:     General: Bowel sounds are normal. There is no distension.     Palpations: Abdomen is soft.  Musculoskeletal:        General: No deformity. Normal range of motion.     Cervical back: Normal range of motion and neck supple.  Skin:    General: Skin is warm and dry.     Findings: No erythema or rash.  Neurological:     Mental Status: He is alert and oriented to person, place, and time. Mental status is at baseline.     Cranial Nerves: No cranial nerve deficit.     Coordination: Coordination normal.  Psychiatric:        Mood and Affect: Mood normal.    LABORATORY DATA:  I have reviewed the data as listed Lab Results  Component Value Date   WBC 4.3 04/03/2021   HGB 10.3 (L) 04/03/2021   HCT 31.7 (L) 04/03/2021   MCV 92.7 04/03/2021   PLT 80 (L) 04/03/2021   Recent Labs    03/06/21 0938 04/03/21 1200  NA 136 136  K 5.2 No hemolysis seen* 4.5  CL 101 102  CO2 26 24  GLUCOSE 110* 184*  BUN 32* 36*  CREATININE 1.47 1.29*  CALCIUM 9.6 8.8*  GFRNONAA  --  59*  PROT 6.9   7.2 6.9  ALBUMIN 4.3 4.0  AST 28 57*  ALT 21 44  ALKPHOS 104 146*  BILITOT 0.7 1.1    Iron/TIBC/Ferritin/ %Sat    Component Value Date/Time   IRON 72 04/03/2021 1200   IRON 112 07/11/2013 1019   TIBC 342 04/03/2021 1200   TIBC 336 07/11/2013 1019   FERRITIN 83 04/03/2021 1200   FERRITIN 39 07/11/2013 1019   IRONPCTSAT 21 04/03/2021 1200   IRONPCTSAT 33 07/11/2013 1019  IRONPCTSAT 35 04/17/2011 0922       RADIOGRAPHIC STUDIES: I have personally reviewed the radiological images as listed and agreed with the findings in the report. US Abdomen Complete  Result Date: 04/23/2021 CLINICAL DATA:  Thrombocytopenia, elevated LFTs EXAM: ABDOMEN ULTRASOUND COMPLETE COMPARISON:  None. FINDINGS: Gallbladder: 2 cm gallstone  within the gallbladder. No wall thickening or sonographic Murphy sign. Common bile duct: Diameter: Normal caliber, 2 mm Liver: Increased echotexture compatible with fatty infiltration. No focal abnormality or biliary ductal dilatation. Portal vein is patent on color Doppler imaging with normal direction of blood flow towards the liver. IVC: No abnormality visualized. Pancreas: Visualized portion unremarkable. Spleen: Size and appearance within normal limits. Right Kidney: Length: 14 cm. Echogenicity within normal limits. No mass or hydronephrosis visualized. Left Kidney: Length: 14.7 cm. Echogenicity within normal limits. No mass or hydronephrosis visualized. Abdominal aorta: No aneurysm visualized. Other findings: Trace right pleural effusion seen. IMPRESSION: Hepatic steatosis. Cholelithiasis.  No sonographic evidence of acute cholecystitis. Trace right pleural effusion. Electronically Signed   By: Rolm Baptise M.D.   On: 04/23/2021 12:12      ASSESSMENT & PLAN:  1. Thrombocytopenia ([place])   2. Normocytic anemia    #Normocytic anemia and thrombocytopenia,  Labs are reviewed and discussed with patient. CBC showed decreased hemoglobin at 10.3, platelet count 80,000.  Creatinine is 1.29 with a GFR of 59.  Slightly increased AST, normal ALT.  Elevated alkaline phosphatase.  Mildly decreased calcium.  Vitamin B12  544.  Ferritin 83, TIBC 342, iron saturation 21.  Reticulocyte panel showed reticulocyte hemoglobin 32.9.  Normal reticulocyte percentage and immature reticulocyte fraction, normal immature platelet fraction- inappropriate.  Negative SPEP, increased light chain ration, UPEP is negative. - he has no signs of gammopathy.  I recommend patient to have bone marrow biopsy for further evaluation. He agrees.   Orders Placed This Encounter  Procedures   CT BONE MARROW BIOPSY & ASPIRATION    Standing Status:   Future    Standing Expiration Date:   06/02/2021    Order Specific Question:   Reason for  Exam (SYMPTOM  OR DIAGNOSIS REQUIRED)    Answer:   Thrombocytopenia    Order Specific Question:   Preferred location?    Answer:   [facility] [place]    Order Specific Question:   Radiology Contrast Protocol - do NOT remove file path    Answer:   [email or web address]    All questions were answered. The patient knows to call the clinic with any problems questions or concerns.   [name], MD    Return of visit: Follow-up2-3 weeks after bone marrow biopsy. . Thank you for this kind referral and the opportunity to participate in the care of this patient. A copy of today's note is routed to referring provider   Earlie Server, MD, PhD [facility] Hematology Oncology 05/03/2021

## 2021-05-06 NOTE — Telephone Encounter (Signed)
Spoke to pt's wife regarding BmBx and she is requesting it be done the week of 2/13, that will give her enough time to recover from surgery and go with him to procedure. Message sent to [facility] with special scheduling, will follow up on appt.

## 2021-05-07 NOTE — Telephone Encounter (Signed)
Patient scheduled for 2/14 @ 9am with arrival time at [facility]. Pt's wife, Zigmund Daniel, informed and verbalized understanding  Please schedule pt for MD follow up approx 2 weeks after Bmbx and notify pt of appt.

## 2021-05-16 ENCOUNTER — Ambulatory Visit: Payer: Medicare HMO

## 2021-05-20 ENCOUNTER — Telehealth: Payer: Self-pay

## 2021-05-20 NOTE — Progress Notes (Addendum)
Chronic Care Management Pharmacy Assistant   Name: Dennis Wise  MRN: [id number] DOB: 01-28-1951  Reason for Encounter: CCM (Hypertension Disease State)   Recent office visits:  None since last CCM Contact  Recent consult visits:  05/02/2021 - Oncology - Patient presented for Thrombocytopenia. Ordered: CT Bone Marrow Biopsy & Aspiration.  04/26/2021 Danae Orleans, PA - Patient presented for low back pain. No other information.  04/23/2021 - Radiology - Patient presented for US Abdomen complete.  04/08/2021 - Ophthalmology - Patient presented for follow up exam. Patient had diabetic eye exam.  04/03/2021 - Oncology - Patient presented for initial visit for Normocytic Anemia. Labs: CBC, CMP, IFE+Protein, Kappa/lambda light chains, Retic Panel. No medication changes.   Hospital visits:  None in previous 6 months  Medications: Outpatient Encounter Medications as of 05/20/2021  Medication Sig   ACCU-CHEK FASTCLIX LANCETS MISC Use to test blood sugar once daily or as needed.  Diagnosis:  E11.3299  Non insulin dependent.   aspirin 81 MG tablet Take 81 mg by mouth daily.   atorvastatin (LIPITOR) 10 MG tablet Take 1 tablet (10 mg total) by mouth at bedtime.   Blood Glucose Monitoring Suppl (ACCU-CHEK NANO SMARTVIEW) w/Device KIT Use to check blood sugar once daily or as needed.  Diagnosis: E11.3299  Non insulin dependent.   glimepiride (AMARYL) 2 MG tablet 1 tab a day   glucose blood (ACCU-CHEK SMARTVIEW) test strip Use as instructed to test blood sugar once daily or as needed.  Diagnosis:  E11.3299  Non insulin dependent.   hydrochlorothiazide (HYDRODIURIL) 12.5 MG tablet Take 1 tablet (12.5 mg total) by mouth daily.   metFORMIN (GLUCOPHAGE) 850 MG tablet Take 1 tablet (850 mg total) by mouth 2 (two) times daily.   Multiple Vitamin (MULTIVITAMIN) tablet Take 1 tablet by mouth daily. Centrum Silver   niacin (NIASPAN) 500 MG CR tablet TAKE 3 TABLETS AT BEDTIME   pioglitazone (ACTOS) 45  MG tablet Take 1 tablet (45 mg total) by mouth daily.   ramipril (ALTACE) 5 MG capsule Take 1 capsule (5 mg total) by mouth daily.   No facility-administered encounter medications on file as of 05/20/2021.   Recent Office Vitals: BP Readings from Last 3 Encounters:  05/02/21 (!) 175/67  04/03/21 (!) 176/71  03/11/21 130/62   Pulse Readings from Last 3 Encounters:  05/02/21 63  04/03/21 60  03/11/21 (!) 57    Wt Readings from Last 3 Encounters:  05/02/21 275 lb 6.4 oz (124.9 kg)  04/03/21 279 lb 8 oz (126.8 kg)  03/11/21 261 lb (118.4 kg)    Kidney Function Lab Results  Component Value Date/Time   CREATININE 1.29 (H) 04/03/2021 12:00 PM   CREATININE 1.47 03/06/2021 09:38 AM   CREATININE 1.2 01/28/2016 08:04 AM   CREATININE 1.0 01/29/2015 10:34 AM   GFR 47.30 (L) 03/06/2021 09:38 AM   GFRNONAA 59 (L) 04/03/2021 12:00 PM   GFRAA 111 08/24/2007 10:15 AM   BMP Latest Ref Rng & Units 04/03/2021 03/06/2021 02/23/2020  Glucose 70 - 99 mg/dL 184(H) 110(H) 140(H)  BUN 8 - 23 mg/dL 36(H) 32(H) 29(H)  Creatinine 0.61 - 1.24 mg/dL 1.29(H) 1.47 1.34  Sodium 135 - 145 mmol/L 136 136 138  Potassium 3.5 - 5.1 mmol/L 4.5 5.2 No hemolysis seen(H) 4.8  Chloride 98 - 111 mmol/L 102 101 101  CO2 22 - 32 mmol/L '24 26 29  ' Calcium 8.9 - 10.3 mg/dL 8.8(L) 9.6 9.0   Attempted to contact patient on 01/30  and 01/31. Unsuccessful outreach. Will attempt to contact patient next month.   Current antihypertensive regimen:  HCTZ 12.5 mg - Take 1 tablet by mouth daily.  Ramipril 5 mg - Take 1 capsule by mouth daily.    Adherence Review: Is the patient currently on ACE/ARB medication? Yes Does the patient have >5 day gap between last estimated fill dates? No  Star Rating Drugs:  Medication:  Last Fill: Day Supply Atorvastatin 56m       03/11/2021     90 Glimepiride 294m         03/11/2021 90 Metformin 85067m      03/11/2021     90 Actos 44m57m              03/11/2021     90 Ramipril 5mg 100m            03/11/2021     90 Fill dates verified with [facility]: Annual wellness visit in last year? Yes 03/05/2021 Most Recent BP reading: 175/67 on 05/02/2021  If Diabetic: Most recent A1C reading: 6.3 on 03/06/2021 Last eye exam / retinopathy screening: Up to date Last diabetic foot exam: Up to date  Upcoming appointments: Oncology appointment on 06/18/2021  MicheDebbora Dus notified  Addaline Peplinski MMarijean Niemann CAlmediastant [phone] Spent: 30 Mi60tes   I have reviewed the care management and care coordination activities outlined in this encounter and I am certifying that I agree with the content of this note. No further action required.  MicheDebbora DusrmD Clinical Pharmacist [phone]

## 2021-05-30 ENCOUNTER — Ambulatory Visit: Payer: Medicare HMO | Admitting: Oncology

## 2021-06-03 ENCOUNTER — Other Ambulatory Visit: Payer: Self-pay | Admitting: Internal Medicine

## 2021-06-04 ENCOUNTER — Other Ambulatory Visit: Payer: Self-pay

## 2021-06-04 ENCOUNTER — Ambulatory Visit
Admission: RE | Admit: 2021-06-04 | Discharge: 2021-06-04 | Disposition: A | Discharge status: Home / Self Care | Payer: Medicare HMO | Source: Ambulatory Visit | Attending: Oncology | Admitting: Oncology

## 2021-06-04 DIAGNOSIS — D61818 Other pancytopenia: Secondary | ICD-10-CM | POA: Diagnosis not present

## 2021-06-04 DIAGNOSIS — D696 Thrombocytopenia, unspecified: Secondary | ICD-10-CM | POA: Diagnosis not present

## 2021-06-04 DIAGNOSIS — E119 Type 2 diabetes mellitus without complications: Secondary | ICD-10-CM | POA: Diagnosis not present

## 2021-06-04 LAB — CBC WITH DIFFERENTIAL/PLATELET
Abs Immature Granulocytes: 0.01 10*3/uL (ref 0.00–0.07)
Basophils Absolute: 0 10*3/uL (ref 0.0–0.1)
Basophils Relative: 1 %
Eosinophils Absolute: 0.1 10*3/uL (ref 0.0–0.5)
Eosinophils Relative: 4 %
HCT: 30.3 % — ABNORMAL LOW (ref 39.0–52.0)
Hemoglobin: 9.5 g/dL — ABNORMAL LOW (ref 13.0–17.0)
Immature Granulocytes: 0 %
Lymphocytes Relative: 28 %
Lymphs Abs: 0.9 10*3/uL (ref 0.7–4.0)
MCH: 29 pg (ref 26.0–34.0)
MCHC: 31.4 g/dL (ref 30.0–36.0)
MCV: 92.4 fL (ref 80.0–100.0)
Monocytes Absolute: 0.4 10*3/uL (ref 0.1–1.0)
Monocytes Relative: 12 %
Neutro Abs: 1.8 10*3/uL (ref 1.7–7.7)
Neutrophils Relative %: 55 %
Platelets: 80 10*3/uL — ABNORMAL LOW (ref 150–400)
RBC: 3.28 MIL/uL — ABNORMAL LOW (ref 4.22–5.81)
RDW: 14.7 % (ref 11.5–15.5)
WBC: 3.2 10*3/uL — ABNORMAL LOW (ref 4.0–10.5)
nRBC: 0 % (ref 0.0–0.2)

## 2021-06-04 LAB — GLUCOSE, CAPILLARY: Glucose-Capillary: 121 mg/dL — ABNORMAL HIGH (ref 70–99)

## 2021-06-04 MED ORDER — MIDAZOLAM HCL 2 MG/2ML IJ SOLN
INTRAMUSCULAR | Status: AC
  Filled 2021-06-04: qty 4

## 2021-06-04 MED ORDER — MIDAZOLAM HCL 2 MG/2ML IJ SOLN
INTRAMUSCULAR | Status: AC | PRN
  Administered 2021-06-04: .5 mg via INTRAVENOUS
  Administered 2021-06-04: 1 mg via INTRAVENOUS

## 2021-06-04 MED ORDER — FENTANYL CITRATE (PF) 100 MCG/2ML IJ SOLN
INTRAMUSCULAR | Status: AC | PRN
  Administered 2021-06-04: 50 ug via INTRAVENOUS
  Administered 2021-06-04: 25 ug via INTRAVENOUS

## 2021-06-04 MED ORDER — SODIUM CHLORIDE 0.9 % IV SOLN
INTRAVENOUS | Status: DC
  Administered 2021-06-04: 1000 mL via INTRAVENOUS

## 2021-06-04 MED ORDER — FENTANYL CITRATE (PF) 100 MCG/2ML IJ SOLN
INTRAMUSCULAR | Status: AC
  Filled 2021-06-04: qty 2

## 2021-06-04 MED ORDER — HEPARIN SOD (PORK) LOCK FLUSH 100 UNIT/ML IV SOLN
INTRAVENOUS | Status: AC
  Filled 2021-06-04: qty 5

## 2021-06-04 NOTE — H&P (Addendum)
Chief Complaint: Thrombocytopenia  Referring Physician(s): Earlie Server  Supervising Physician: Corrie Mckusick  Patient Status: [facility] - Out-pt  History of Present Illness: Dennis Wise is a 71 y.o. male who is being evaluated for pancytopenia by Dr. Tasia Catchings.  Labs done 03/06/2021 showed WBC of 4.6, hemoglobin 10.9, normal platelet count at 154,000.  Labs on 04/03/21 showed platelets had dropped to 80,000.  He is here today for bone marrow biopsy.  He is NPO. No nausea/vomiting. No Fever/chills. ROS negative.   Past Medical History:  Diagnosis Date   Anemia    Iron deficiency part of this   Cataract    bilateral lens implants   Diabetes mellitus    type II   GERD (gastroesophageal reflux disease) 94/1740   Helicobacter pylori gastritis 06/11/2011   On EGD 05/2011    Hyperlipidemia    Hypertension 11/1999   Internal hemorrhoids    Iron deficiency anemia, unspecified 03/04/2011   MGUS (monoclonal gammopathy of unknown significance)    possible dx initially, had negative f/u   Pancytopenia    Pancytopenia, acquired ([place])    Personal history of colonic adenomas 06/05/2012   Pneumonia     Past Surgical History:  Procedure Laterality Date   CATARACT EXTRACTION Bilateral    COLONOSCOPY     FLEXIBLE SIGMOIDOSCOPY  05/1988   internal hemorrhoids   KNEE SURGERY  09/04   lt knee fx repair ORIF   TIBIA FRACTURE SURGERY     left 2012    Allergies: Promethazine hcl and Other  Medications: Prior to Admission medications   Medication Sig Start Date End Date Taking? Authorizing Provider  ACCU-CHEK FASTCLIX LANCETS MISC Use to test blood sugar once daily or as needed.  Diagnosis:  E11.3299  Non insulin dependent. 02/22/16   [name], MD  aspirin 81 MG tablet Take 81 mg by mouth daily.    [provider]  atorvastatin (LIPITOR) 10 MG tablet Take 1 tablet (10 mg total) by mouth at bedtime. 03/11/21   [name], MD  Blood Glucose Monitoring Suppl  (ACCU-CHEK NANO SMARTVIEW) w/Device KIT Use to check blood sugar once daily or as needed.  Diagnosis: E11.3299  Non insulin dependent. 02/22/16   [name], MD  glimepiride (AMARYL) 2 MG tablet 1 tab a day 03/11/21   [name], MD  glucose blood (ACCU-CHEK SMARTVIEW) test strip Use as instructed to test blood sugar once daily or as needed.  Diagnosis:  E11.3299  Non insulin dependent. 03/13/20   [name], MD  hydrochlorothiazide (HYDRODIURIL) 12.5 MG tablet Take 1 tablet (12.5 mg total) by mouth daily. 03/11/21   [name], MD  metFORMIN (GLUCOPHAGE) 850 MG tablet Take 1 tablet (850 mg total) by mouth 2 (two) times daily. 03/11/21   [name], MD  Multiple Vitamin (MULTIVITAMIN) tablet Take 1 tablet by mouth daily. Centrum Silver    [provider]  niacin (NIASPAN) 500 MG CR tablet TAKE 3 TABLETS AT BEDTIME 02/16/13   [name], MD  pioglitazone (ACTOS) 45 MG tablet Take 1 tablet (45 mg total) by mouth daily. 03/11/21   [name], MD  ramipril (ALTACE) 5 MG capsule Take 1 capsule (5 mg total) by mouth daily. 03/11/21   [name], MD     Family History  Problem Relation Age of Onset   Cancer Mother        ?   Diabetes Mother    Heart  failure Mother    Emphysema Father    Cancer Father        ?   Alzheimer's disease Father    Skin cancer Brother    Diabetes Brother    Colon cancer Neg Hx    Prostate cancer Neg Hx    Esophageal cancer Neg Hx    Rectal cancer Neg Hx    Stomach cancer Neg Hx     Social History   Socioeconomic History   Marital status: Married    Spouse name: Not on file   Number of children: Not on file   Years of education: Not on file   Highest education level: Not on file  Occupational History   Not on file  Tobacco Use   Smoking status: Never   Smokeless tobacco: Never  Vaping Use   Vaping Use: Never used  Substance and Sexual Activity   Alcohol use: Never   Drug use: No   Sexual  activity: Yes  Other Topics Concern   Not on file  Social History Narrative   Prev traveling for sports broadcasting    Working for [employer] as of 2019   Married 1970   1 child   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   Social Determinants of Health   Financial Resource Strain: Low Risk    Difficulty of Paying Living Expenses: Not hard at all  Food Insecurity: No Food Insecurity   Worried About Charity fundraiser in the Last Year: Never true   [place] in the Last Year: Never true  Transportation Needs: No Transportation Needs   Lack of Transportation (Medical): No   Lack of Transportation (Non-Medical): No  Physical Activity: Unknown   Days of Exercise per Week: 7 days   Minutes of Exercise per Session: Not on file  Stress: No Stress Concern Present   Feeling of Stress : Not at all  Social Connections: Moderately Isolated   Frequency of Communication with Friends and Family: Twice a week   Frequency of Social Gatherings with Friends and Family: More than three times a week   Attends Religious Services: Never   Marine scientist or Organizations: No   Attends Archivist Meetings: Never   Marital Status: Married     Review of Systems: A 12 point ROS discussed and pertinent positives are indicated in the HPI above.  All other systems are negative.  Review of Systems  Vital Signs: BP (!) 176/54    Pulse (!) 55    Temp 97.9 F (36.6 C) (Oral)    Resp 17    Ht '6\' 1"'  (1.854 m)    Wt 270 lb (122.5 kg)    SpO2 99%    BMI 35.62 kg/m   Physical Exam Vitals reviewed.  Constitutional:      Appearance: Normal appearance.  HENT:     Head: Normocephalic and atraumatic.  Eyes:     Extraocular Movements: Extraocular movements intact.  Cardiovascular:     Rate and Rhythm: Normal rate and regular rhythm.  Pulmonary:     Effort: Pulmonary effort is normal. No respiratory distress.     Breath sounds: Normal breath sounds.  Abdominal:     General: There is no  distension.     Palpations: Abdomen is soft.     Tenderness: There is no abdominal tenderness.  Musculoskeletal:        General: Normal range of motion.     Cervical back: Normal  range of motion.  Skin:    General: Skin is warm and dry.  Neurological:     General: No focal deficit present.     Mental Status: He is alert and oriented to person, place, and time.  Psychiatric:        Mood and Affect: Mood normal.        Behavior: Behavior normal.        Thought Content: Thought content normal.        Judgment: Judgment normal.    Imaging: No results found.  Labs:  CBC: Recent Labs    03/06/21 0938 04/03/21 1200  WBC 4.6 4.3  HGB 10.9* 10.3*  HCT 32.2* 31.7*  PLT 154.0 80*    COAGS: No results for input(s): INR, APTT in the last 8760 hours.  BMP: Recent Labs    03/06/21 0938 04/03/21 1200  NA 136 136  K 5.2 No hemolysis seen* 4.5  CL 101 102  CO2 26 24  GLUCOSE 110* 184*  BUN 32* 36*  CALCIUM 9.6 8.8*  CREATININE 1.47 1.29*  GFRNONAA  --  59*    LIVER FUNCTION TESTS: Recent Labs    03/06/21 0938 04/03/21 1200  BILITOT 0.7 1.1  AST 28 57*  ALT 21 44  ALKPHOS 104 146*  PROT 6.9   7.2 6.9  ALBUMIN 4.3 4.0    TUMOR MARKERS: No results for input(s): AFPTM, CEA, CA199, CHROMGRNA in the last 8760 hours.  Assessment and Plan:  Thrombocytopenia  Will proceed with bone marrow biopsy today by Dr. Earleen Newport.  Risks and benefits of bone marrow biopsy was discussed with the patient and/or patient's family including, but not limited to bleeding, infection, damage to adjacent structures or low yield requiring additional tests.  All of the questions were answered and there is agreement to proceed.  Consent signed and in chart.  Thank you for allowing our service to participate in Dennis Wise 's care.  Electronically Signed: Murrell Redden, PA-C   06/04/2021, 8:29 AM      I spent a total of  15 Minutes   in face to face in clinical consultation,  greater than 50% of which was counseling/coordinating care for bone marrow biopsy.

## 2021-06-04 NOTE — Procedures (Signed)
Interventional Radiology Procedure Note  Procedure: CT guided aspirate and core biopsy of right posterior iliac bone Complications: None Recommendations: - Bedrest supine x 1 hrs - OTC's PRN  Pain - Follow biopsy results  Signed,  Renelda Kilian S. Cinsere Mizrahi, DO    

## 2021-06-07 LAB — SURGICAL PATHOLOGY

## 2021-06-10 ENCOUNTER — Telehealth: Payer: Self-pay

## 2021-06-10 NOTE — Progress Notes (Signed)
° ° °Chronic Care Management °Pharmacy Assistant  ° °Name: Dennis Wise  MRN: [id number] DOB: 09/03/1950 ° °Reason for Encounter: CCM (Hypertension Disease State) °  °Recent office visits:  °None since last CCM contact ° °Recent consult visits:  °06/04/2021 - Zhou Yu, MD - Radiology - Patient presented for CT Bone Marrow Biopsy.  ° °Hospital visits:  °None in previous 6 months ° °Medications: °Outpatient Encounter Medications as of 06/10/2021  °Medication Sig  ° ACCU-CHEK FASTCLIX LANCETS MISC Use to test blood sugar once daily or as needed.  Diagnosis:  E11.3299  Non insulin dependent.  ° aspirin 81 MG tablet Take 81 mg by mouth daily.  ° atorvastatin (LIPITOR) 10 MG tablet Take 1 tablet (10 mg total) by mouth at bedtime.  ° Blood Glucose Monitoring Suppl (ACCU-CHEK NANO SMARTVIEW) w/Device KIT Use to check blood sugar once daily or as needed.  Diagnosis: E11.3299  Non insulin dependent.  ° glimepiride (AMARYL) 2 MG tablet 1 tab a day  ° glucose blood (ACCU-CHEK SMARTVIEW) test strip Use as instructed to test blood sugar once daily or as needed.  Diagnosis:  E11.3299  Non insulin dependent.  ° hydrochlorothiazide (HYDRODIURIL) 12.5 MG tablet Take 1 tablet (12.5 mg total) by mouth daily.  ° metFORMIN (GLUCOPHAGE) 850 MG tablet Take 1 tablet (850 mg total) by mouth 2 (two) times daily.  ° Multiple Vitamin (MULTIVITAMIN) tablet Take 1 tablet by mouth daily. Centrum Silver  ° niacin (NIASPAN) 500 MG CR tablet TAKE 3 TABLETS AT BEDTIME  ° pioglitazone (ACTOS) 45 MG tablet Take 1 tablet (45 mg total) by mouth daily.  ° ramipril (ALTACE) 5 MG capsule Take 1 capsule (5 mg total) by mouth daily.  ° °No facility-administered encounter medications on file as of 06/10/2021.  ° °Recent Office Vitals: °BP Readings from Last 3 Encounters:  °06/04/21 (!) 148/56  °05/02/21 (!) 175/67  °04/03/21 (!) 176/71  ° °Pulse Readings from Last 3 Encounters:  °06/04/21 (!) 46  °05/02/21 63  °04/03/21 60  °  °Wt Readings from Last 3 Encounters:   °06/04/21 270 lb (122.5 kg)  °05/02/21 275 lb 6.4 oz (124.9 kg)  °04/03/21 279 lb 8 oz (126.8 kg)  °  °Kidney Function °Lab Results  °Component Value Date/Time  ° CREATININE 1.29 (H) 04/03/2021 12:00 PM  ° CREATININE 1.47 03/06/2021 09:38 AM  ° CREATININE 1.2 01/28/2016 08:04 AM  ° CREATININE 1.0 01/29/2015 10:34 AM  ° GFR 47.30 (L) 03/06/2021 09:38 AM  ° GFRNONAA 59 (L) 04/03/2021 12:00 PM  ° GFRAA 111 08/24/2007 10:15 AM  ° °BMP Latest Ref Rng & Units 04/03/2021 03/06/2021 02/23/2020  °Glucose 70 - 99 mg/dL 184(H) 110(H) 140(H)  °BUN 8 - 23 mg/dL 36(H) 32(H) 29(H)  °Creatinine 0.61 - 1.24 mg/dL 1.29(H) 1.47 1.34  °Sodium 135 - 145 mmol/L 136 136 138  °Potassium 3.5 - 5.1 mmol/L 4.5 5.2 No hemolysis seen(H) 4.8  °Chloride 98 - 111 mmol/L 102 101 101  °CO2 22 - 32 mmol/L 24 26 29  °Calcium 8.9 - 10.3 mg/dL 8.8(L) 9.6 9.0  ° ° °Attempted contact with patient 3 times on 02/20, 02/22 and 02/23. Unsuccessful outreach. Will atttempt contact next month. ° °Current antihypertensive regimen:  °HCTZ 12.5 mg - Take 1 tablet by mouth daily.  °Ramipril 5 mg - Take 1 capsule by mouth daily. °  °Adherence Review: °Is the patient currently on ACE/ARB medication? Yes °Does the patient have >5 day gap between last estimated fill dates? No ° °Star Rating   Rating Drugs:  Medication:                Last Fill:         Day Supply Atorvastatin 66m       03/11/2021     90 Glimepiride 287m         03/11/2021 90 Metformin 85057m      03/11/2021     90 Actos 57m3m              03/11/2021     90 Ramipril 5mg 55m           03/11/2021     90 Fill dates verified with [facility]: Annual wellness visit in last year? Yes 03/05/2021 Most Recent BP reading: 175/67 on 05/02/2021   If Diabetic: Most recent A1C reading: 6.3 on 03/06/2021 Last eye exam / retinopathy screening: Up to date Last diabetic foot exam: Up to date   Upcoming appointments: Oncology appointment on 06/18/2021  MicheDebbora Dus notified  Avian Konigsberg  Marijean Niemann [place] [phone]

## 2021-06-11 ENCOUNTER — Encounter (HOSPITAL_COMMUNITY): Payer: Self-pay | Admitting: Oncology

## 2021-06-13 ENCOUNTER — Encounter (HOSPITAL_COMMUNITY): Payer: Self-pay | Admitting: Oncology

## 2021-06-13 DIAGNOSIS — D225 Melanocytic nevi of trunk: Secondary | ICD-10-CM | POA: Diagnosis not present

## 2021-06-13 DIAGNOSIS — D485 Neoplasm of uncertain behavior of skin: Secondary | ICD-10-CM | POA: Diagnosis not present

## 2021-06-13 DIAGNOSIS — D0421 Carcinoma in situ of skin of right ear and external auricular canal: Secondary | ICD-10-CM | POA: Diagnosis not present

## 2021-06-13 DIAGNOSIS — D2271 Melanocytic nevi of right lower limb, including hip: Secondary | ICD-10-CM | POA: Diagnosis not present

## 2021-06-13 DIAGNOSIS — D2262 Melanocytic nevi of left upper limb, including shoulder: Secondary | ICD-10-CM | POA: Diagnosis not present

## 2021-06-13 DIAGNOSIS — D2261 Melanocytic nevi of right upper limb, including shoulder: Secondary | ICD-10-CM | POA: Diagnosis not present

## 2021-06-13 DIAGNOSIS — Z85828 Personal history of other malignant neoplasm of skin: Secondary | ICD-10-CM | POA: Diagnosis not present

## 2021-06-13 DIAGNOSIS — X32XXXA Exposure to sunlight, initial encounter: Secondary | ICD-10-CM | POA: Diagnosis not present

## 2021-06-13 DIAGNOSIS — L570 Actinic keratosis: Secondary | ICD-10-CM | POA: Diagnosis not present

## 2021-06-14 ENCOUNTER — Encounter (HOSPITAL_COMMUNITY): Payer: Self-pay | Admitting: Oncology

## 2021-06-17 ENCOUNTER — Encounter (HOSPITAL_COMMUNITY): Payer: Self-pay | Admitting: Oncology

## 2021-06-17 ENCOUNTER — Encounter (HOSPITAL_COMMUNITY): Payer: Self-pay

## 2021-06-18 ENCOUNTER — Ambulatory Visit: Payer: Medicare HMO | Admitting: Oncology

## 2021-06-24 ENCOUNTER — Encounter: Payer: Self-pay | Admitting: Family Medicine

## 2021-06-24 ENCOUNTER — Inpatient Hospital Stay: Payer: Medicare HMO | Attending: Oncology | Admitting: Oncology

## 2021-06-24 ENCOUNTER — Other Ambulatory Visit: Payer: Self-pay

## 2021-06-24 ENCOUNTER — Encounter: Payer: Self-pay | Admitting: Oncology

## 2021-06-24 VITALS — BP 179/74 | HR 52 | Temp 96.1°F | Wt 272.0 lb

## 2021-06-24 DIAGNOSIS — K760 Fatty (change of) liver, not elsewhere classified: Secondary | ICD-10-CM | POA: Insufficient documentation

## 2021-06-24 DIAGNOSIS — R0602 Shortness of breath: Secondary | ICD-10-CM | POA: Insufficient documentation

## 2021-06-24 DIAGNOSIS — R5383 Other fatigue: Secondary | ICD-10-CM | POA: Diagnosis not present

## 2021-06-24 DIAGNOSIS — Z79899 Other long term (current) drug therapy: Secondary | ICD-10-CM | POA: Diagnosis not present

## 2021-06-24 DIAGNOSIS — Z809 Family history of malignant neoplasm, unspecified: Secondary | ICD-10-CM | POA: Insufficient documentation

## 2021-06-24 DIAGNOSIS — I872 Venous insufficiency (chronic) (peripheral): Secondary | ICD-10-CM | POA: Insufficient documentation

## 2021-06-24 DIAGNOSIS — I10 Essential (primary) hypertension: Secondary | ICD-10-CM | POA: Diagnosis not present

## 2021-06-24 DIAGNOSIS — Z818 Family history of other mental and behavioral disorders: Secondary | ICD-10-CM | POA: Insufficient documentation

## 2021-06-24 DIAGNOSIS — D61818 Other pancytopenia: Secondary | ICD-10-CM | POA: Insufficient documentation

## 2021-06-24 DIAGNOSIS — D696 Thrombocytopenia, unspecified: Secondary | ICD-10-CM

## 2021-06-24 DIAGNOSIS — E119 Type 2 diabetes mellitus without complications: Secondary | ICD-10-CM | POA: Diagnosis not present

## 2021-06-24 DIAGNOSIS — Z8249 Family history of ischemic heart disease and other diseases of the circulatory system: Secondary | ICD-10-CM | POA: Diagnosis not present

## 2021-06-24 DIAGNOSIS — Z836 Family history of other diseases of the respiratory system: Secondary | ICD-10-CM | POA: Diagnosis not present

## 2021-06-24 DIAGNOSIS — Z833 Family history of diabetes mellitus: Secondary | ICD-10-CM | POA: Insufficient documentation

## 2021-06-24 DIAGNOSIS — D649 Anemia, unspecified: Secondary | ICD-10-CM | POA: Diagnosis not present

## 2021-06-24 DIAGNOSIS — D472 Monoclonal gammopathy: Secondary | ICD-10-CM | POA: Diagnosis not present

## 2021-06-24 DIAGNOSIS — R748 Abnormal levels of other serum enzymes: Secondary | ICD-10-CM | POA: Diagnosis not present

## 2021-06-24 NOTE — Progress Notes (Signed)
Hematology/Oncology Consult note [phone] [phone]      Patient Care Team: [name], MD as PCP - General (Family Medicine) Gearlean Alf, MD as Referring Physician (Ophthalmology) Nicholas Lose, MD as Consulting Physician (Hematology and Oncology) [name], [facility] as Pharmacist (Pharmacist)  REFERRING PROVIDER: [name], MD  CHIEF COMPLAINTS/REASON FOR VISIT:  pancytopenia  HISTORY OF PRESENTING ILLNESS:   Dennis Wise is a  71 y.o.  male with PMH listed below was seen in consultation at the request of  [name], MD  for evaluation of pancytopenia  03/06/2021, patient had blood work done.  CBC showed normal WBC of 4.6, hemoglobin 10.9, normal platelet count at 154,000.  Chemistry labs showed normal creatinine at 1.47 with a GFR of 47.3, normal bilirubin.  Potassium was elevated at 5.2.  Patient reports a history of MGUS.  Initially diagnosed by oncology Dr. Humphrey Rolls. 04/16/2021, kappa light chain ratio was elevated at 1.67.  04/17/2011, SPEP showed no monoclonal protein. 04/25/2011 24-hour urine protein 578. 04/25/2011 patient had a bone marrow biopsy and aspirate which showed a 3% of polyclonal plasma cells staining for kappa and lambda light chain and lack of large aggregates or sheets.  Overall changes are nonspecific.  Patient L was seen by oncology Dr. Nicholas Lose who did not feel that patient meets diagnostic criteria for MGUS and the patient was discharged.  Patient reports feeling well except chronic fatigue.  Patient had history of left lower extremity surgery due to fracture and he has chronic venous insufficiency. Patient denies any constitutional symptoms.   INTERVAL HISTORY Dennis Wise is a 71 y.o. male who has above history reviewed by me today presents for follow up visit for management of pancytopenia.  Status post bone marrow biopsy.  Patient presents to discuss results. + Fatigue, shortness of breath with  exertion.  He was accompanied by wife.  Review of Systems  Constitutional:  Positive for fatigue. Negative for appetite change, chills, fever and unexpected weight change.  HENT:   Negative for hearing loss and voice change.   Eyes:  Negative for eye problems and icterus.  Respiratory:  Negative for chest tightness, cough and shortness of breath.   Cardiovascular:  Negative for chest pain and leg swelling.  Gastrointestinal:  Negative for abdominal distention and abdominal pain.  Endocrine: Negative for hot flashes.  Genitourinary:  Negative for difficulty urinating, dysuria and frequency.   Musculoskeletal:  Negative for arthralgias.  Skin:  Negative for itching and rash.  Neurological:  Negative for light-headedness and numbness.  Hematological:  Negative for adenopathy. Does not bruise/bleed easily.  Psychiatric/Behavioral:  Negative for confusion.    MEDICAL HISTORY:  Past Medical History:  Diagnosis Date   Anemia    Iron deficiency part of this   Cataract    bilateral lens implants   Diabetes mellitus    type II   GERD (gastroesophageal reflux disease) 79/3903   Helicobacter pylori gastritis 06/11/2011   On EGD 05/2011    Hyperlipidemia    Hypertension 11/1999   Internal hemorrhoids    Iron deficiency anemia, unspecified 03/04/2011   MGUS (monoclonal gammopathy of unknown significance)    possible dx initially, had negative f/u   Pancytopenia    Pancytopenia, acquired ([place])    Personal history of colonic adenomas 06/05/2012   Pneumonia     SURGICAL HISTORY: Past Surgical History:  Procedure Laterality Date   CATARACT EXTRACTION Bilateral    COLONOSCOPY     FLEXIBLE  SIGMOIDOSCOPY  05/1988   internal hemorrhoids   KNEE SURGERY  09/04   lt knee fx repair ORIF   TIBIA FRACTURE SURGERY     left 2012    SOCIAL HISTORY: Social History   Socioeconomic History   Marital status: Married    Spouse name: Not on file   Number of children: Not on file   Years of  education: Not on file   Highest education level: Not on file  Occupational History   Not on file  Tobacco Use   Smoking status: Never   Smokeless tobacco: Never  Vaping Use   Vaping Use: Never used  Substance and Sexual Activity   Alcohol use: Never   Drug use: No   Sexual activity: Yes  Other Topics Concern   Not on file  Social History Narrative   Prev traveling for sports broadcasting    Working for [employer], [place], [employer] as of 2019   Married 1970   1 child   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   Social Determinants of Health   Financial Resource Strain: Low Risk    Difficulty of Paying Living Expenses: Not hard at all  Food Insecurity: No Food Insecurity   Worried About Charity fundraiser in the Last Year: Never true   [place] in the Last Year: Never true  Transportation Needs: No Transportation Needs   Lack of Transportation (Medical): No   Lack of Transportation (Non-Medical): No  Physical Activity: Unknown   Days of Exercise per Week: 7 days   Minutes of Exercise per Session: Not on file  Stress: No Stress Concern Present   Feeling of Stress : Not at all  Social Connections: Moderately Isolated   Frequency of Communication with Friends and Family: Twice a week   Frequency of Social Gatherings with Friends and Family: More than three times a week   Attends Religious Services: Never   Marine scientist or Organizations: No   Attends Music therapist: Never   Marital Status: Married  Human resources officer Violence: Not At Risk   Fear of Current or Ex-Partner: No   Emotionally Abused: No   Physically Abused: No   Sexually Abused: No    FAMILY HISTORY: Family History  Problem Relation Age of Onset   Cancer Mother        ?   Diabetes Mother    Heart failure Mother    Emphysema Father    Cancer Father        ?   Alzheimer's disease Father    Skin cancer Brother    Diabetes Brother    Colon cancer Neg Hx    Prostate cancer Neg Hx     Esophageal cancer Neg Hx    Rectal cancer Neg Hx    Stomach cancer Neg Hx     ALLERGIES:  is allergic to promethazine hcl and other.  MEDICATIONS:  Current Outpatient Medications  Medication Sig Dispense Refill   ACCU-CHEK FASTCLIX LANCETS MISC Use to test blood sugar once daily or as needed.  Diagnosis:  E11.3299  Non insulin dependent. 102 each 3   aspirin 81 MG tablet Take 81 mg by mouth daily.     atorvastatin (LIPITOR) 10 MG tablet Take 1 tablet (10 mg total) by mouth at bedtime. 90 tablet 3   Blood Glucose Monitoring Suppl (ACCU-CHEK NANO SMARTVIEW) w/Device KIT Use to check blood sugar once daily or as needed.  Diagnosis: E11.3299  Non insulin dependent.  1 kit 0   glimepiride (AMARYL) 2 MG tablet 1 tab a day 90 tablet 3   glucose blood (ACCU-CHEK SMARTVIEW) test strip Use as instructed to test blood sugar once daily or as needed.  Diagnosis:  E11.3299  Non insulin dependent. 100 each 3   hydrochlorothiazide (HYDRODIURIL) 12.5 MG tablet Take 1 tablet (12.5 mg total) by mouth daily. 90 tablet 3   metFORMIN (GLUCOPHAGE) 850 MG tablet Take 1 tablet (850 mg total) by mouth 2 (two) times daily. 180 tablet 3   Multiple Vitamin (MULTIVITAMIN) tablet Take 1 tablet by mouth daily. Centrum Silver     niacin (NIASPAN) 500 MG CR tablet TAKE 3 TABLETS AT BEDTIME 270 tablet 2   pioglitazone (ACTOS) 45 MG tablet Take 1 tablet (45 mg total) by mouth daily. 90 tablet 3   ramipril (ALTACE) 5 MG capsule Take 1 capsule (5 mg total) by mouth daily. 90 capsule 3   No current facility-administered medications for this visit.     PHYSICAL EXAMINATION: ECOG PERFORMANCE STATUS: 1 - Symptomatic but completely ambulatory Vitals:   06/24/21 1114  BP: (!) 179/74  Pulse: (!) 52  Temp: (!) 96.1 F (35.6 C)   Filed Weights   06/24/21 1114  Weight: 272 lb (123.4 kg)    Physical Exam Constitutional:      General: He is not in acute distress. HENT:     Head: Normocephalic and atraumatic.  Eyes:      General: No scleral icterus. Cardiovascular:     Rate and Rhythm: Normal rate and regular rhythm.     Heart sounds: Normal heart sounds.  Pulmonary:     Effort: Pulmonary effort is normal. No respiratory distress.     Breath sounds: No wheezing.  Abdominal:     General: Bowel sounds are normal. There is no distension.     Palpations: Abdomen is soft.  Musculoskeletal:        General: No deformity. Normal range of motion.     Cervical back: Normal range of motion and neck supple.  Skin:    General: Skin is warm and dry.     Findings: No erythema or rash.  Neurological:     Mental Status: He is alert and oriented to person, place, and time. Mental status is at baseline.     Cranial Nerves: No cranial nerve deficit.     Coordination: Coordination normal.  Psychiatric:        Mood and Affect: Mood normal.    LABORATORY DATA:  I have reviewed the data as listed Lab Results  Component Value Date   WBC 3.2 (L) 06/04/2021   HGB 9.5 (L) 06/04/2021   HCT 30.3 (L) 06/04/2021   MCV 92.4 06/04/2021   PLT 80 (L) 06/04/2021   Recent Labs    03/06/21 0938 04/03/21 1200  NA 136 136  K 5.2 No hemolysis seen* 4.5  CL 101 102  CO2 26 24  GLUCOSE 110* 184*  BUN 32* 36*  CREATININE 1.47 1.29*  CALCIUM 9.6 8.8*  GFRNONAA  --  59*  PROT 6.9   7.2 6.9  ALBUMIN 4.3 4.0  AST 28 57*  ALT 21 44  ALKPHOS 104 146*  BILITOT 0.7 1.1    Iron/TIBC/Ferritin/ %Sat    Component Value Date/Time   IRON 72 04/03/2021 1200   IRON 112 07/11/2013 1019   TIBC 342 04/03/2021 1200   TIBC 336 07/11/2013 1019   FERRITIN 83 04/03/2021 1200   FERRITIN 39 07/11/2013 1019  IRONPCTSAT 21 04/03/2021 1200   IRONPCTSAT 33 07/11/2013 1019   IRONPCTSAT 35 04/17/2011 0922       RADIOGRAPHIC STUDIES: I have personally reviewed the radiological images as listed and agreed with the findings in the report. CT BONE MARROW BIOPSY & ASPIRATION  Result Date: 06/04/2021 INDICATION: 71 year old male referred  for bone marrow biopsy EXAM: CT BONE MARROW BIOPSY AND ASPIRATION MEDICATIONS: None. ANESTHESIA/SEDATION: Moderate (conscious) sedation was employed during this procedure. A total of Versed 1.5 mg and Fentanyl 75 mcg was administered intravenously. Moderate Sedation Time: 13 minutes. The patient's level of consciousness and vital signs were monitored continuously by radiology nursing throughout the procedure under my direct supervision. FLUOROSCOPY TIME:  CT COMPLICATIONS: None PROCEDURE: The procedure risks, benefits, and alternatives were explained to the patient. Questions regarding the procedure were encouraged and answered. The patient understands and consents to the procedure. Scout CT of the pelvis was performed for surgical planning purposes. The posterior pelvis was prepped with Chlorhexidine in a sterile fashion, and a sterile drape was applied covering the operative field. A sterile gown and sterile gloves were used for the procedure. Local anesthesia was provided with 1% Lidocaine. Posterior iliac bone was targeted for biopsy. The skin and subcutaneous tissues were infiltrated with 1% lidocaine without epinephrine. A small stab incision was made with an 11 blade scalpel, and an 11 gauge Murphy needle was advanced with CT guidance to the posterior cortex. Manual forced was used to advance the needle through the posterior cortex and the stylet was removed. A bone marrow aspirate was retrieved and passed to a cytotechnologist in the room. The Murphy needle was then advanced without the stylet for a core biopsy. The core biopsy was retrieved and also passed to a cytotechnologist. Manual pressure was used for hemostasis and a sterile dressing was placed. No complications were encountered no significant blood loss was encountered. Patient tolerated the procedure well and remained hemodynamically stable throughout. IMPRESSION: Status post CT-guided bone marrow biopsy, with tissue specimen sent to pathology for  complete histopathologic analysis Signed, Dulcy Fanny. Earleen Newport, DO [facility] Radiology Electronically Signed   By: Corrie Mckusick D.O.   On: 06/04/2021 10:18      ASSESSMENT & PLAN:  1. Thrombocytopenia ([place])   2. Normocytic anemia   3. Fatty liver disease, nonalcoholic   4. Elevated alkaline phosphatase level    #Normocytic anemia and thrombocytopenia,  Labs are reviewed and discussed with patient. CBC showed decreased hemoglobin at 10.3, platelet count 80,000.  Creatinine is 1.29 with a GFR of 59.  Slightly increased AST, normal ALT.  Elevated alkaline phosphatase.  Mildly decreased calcium.  Vitamin B12  544.  Ferritin 83, TIBC 342, iron saturation 21.  Reticulocyte panel showed reticulocyte hemoglobin 32.9.  Normal reticulocyte percentage and immature reticulocyte fraction, normal immature platelet fraction- inappropriate.  Negative SPEP, increased light chain ration, UPEP is negative. - he has no signs of gammopathy.  Peripheral blood showed CD5 positive, CD23 positive clonal B cells, less than 5000-consistent with monoclonal lymphocytosis. 06/04/2021, bone marrow biopsy showed hypercellular marrow with mild erythroid atypia.  Morphology features do not provide a precise explanation for patient's cytopenia.  Absence of significant morphological dysplasia makes an MDS less likely and alternative causes differential is wide.   B-cell gene rearrangement was detected.  This is likely secondary to monoclonal lymphocytosis.  And T-cell gene arrangement were detected-this is likely reactive. I have discussed with bone marrow biopsy findings with pathology Dr. Melina Copa and  she will send NexGen sequencing on the bone marrow.  Iron stain showed a diminished to absent iron.  Ring sideroblasts are not identified. Recommend patient to start on oral iron supplementation, Vitron C 1 tab daily.   Repeat iron, TIBC ferritin, ANA, CRP, ESR, Erythropoietin  level.  # elevated alkline phosphatase. US abdomen showed fatty liver disease.  Check GGT   Orders Placed This Encounter  Procedures   Comprehensive metabolic panel    Standing Status:   Future    Standing Expiration Date:   06/25/2022   CBC with Differential/Platelet    Standing Status:   Future    Standing Expiration Date:   06/25/2022   Hold Tube- Blood Bank    Standing Status:   Future    Standing Expiration Date:   06/25/2022    All questions were answered. The patient knows to call the clinic with any problems questions or concerns.  cc [name], MD    Return of visit: 4 weeks.  Thank you for this kind referral and the opportunity to participate in the care of this patient. A copy of today's note is routed to referring provider   Earlie Server, MD, PhD [facility] Hematology Oncology 06/24/2021

## 2021-06-25 ENCOUNTER — Telehealth: Payer: Self-pay

## 2021-06-25 NOTE — Telephone Encounter (Signed)
Please contact pt to schedule labs this week. Per Dr. Tasia Catchings, she discussed this with him at visit. Mychart message also sent ?

## 2021-06-25 NOTE — Telephone Encounter (Signed)
-----   Message from Earlie Server, MD sent at 06/24/2021  8:36 PM EST ----- ?Please arrange him to get addition blood work done this week. Discussed with him during encounter that he will do labs this week after I discuss his case with path.  ? ?

## 2021-06-26 ENCOUNTER — Inpatient Hospital Stay: Payer: Medicare HMO

## 2021-06-26 ENCOUNTER — Other Ambulatory Visit: Payer: Self-pay

## 2021-06-26 DIAGNOSIS — D472 Monoclonal gammopathy: Secondary | ICD-10-CM | POA: Diagnosis not present

## 2021-06-26 DIAGNOSIS — D649 Anemia, unspecified: Secondary | ICD-10-CM

## 2021-06-26 DIAGNOSIS — D61818 Other pancytopenia: Secondary | ICD-10-CM | POA: Diagnosis not present

## 2021-06-26 DIAGNOSIS — K760 Fatty (change of) liver, not elsewhere classified: Secondary | ICD-10-CM | POA: Diagnosis not present

## 2021-06-26 DIAGNOSIS — I10 Essential (primary) hypertension: Secondary | ICD-10-CM | POA: Diagnosis not present

## 2021-06-26 DIAGNOSIS — R0602 Shortness of breath: Secondary | ICD-10-CM | POA: Diagnosis not present

## 2021-06-26 DIAGNOSIS — D696 Thrombocytopenia, unspecified: Secondary | ICD-10-CM

## 2021-06-26 DIAGNOSIS — I872 Venous insufficiency (chronic) (peripheral): Secondary | ICD-10-CM | POA: Diagnosis not present

## 2021-06-26 DIAGNOSIS — R5383 Other fatigue: Secondary | ICD-10-CM | POA: Diagnosis not present

## 2021-06-26 DIAGNOSIS — R748 Abnormal levels of other serum enzymes: Secondary | ICD-10-CM | POA: Diagnosis not present

## 2021-06-26 LAB — CBC WITH DIFFERENTIAL/PLATELET
Abs Immature Granulocytes: 0.01 10*3/uL (ref 0.00–0.07)
Basophils Absolute: 0 10*3/uL (ref 0.0–0.1)
Basophils Relative: 0 %
Eosinophils Absolute: 0.1 10*3/uL (ref 0.0–0.5)
Eosinophils Relative: 3 %
HCT: 31.6 % — ABNORMAL LOW (ref 39.0–52.0)
Hemoglobin: 9.9 g/dL — ABNORMAL LOW (ref 13.0–17.0)
Immature Granulocytes: 0 %
Lymphocytes Relative: 23 %
Lymphs Abs: 0.8 10*3/uL (ref 0.7–4.0)
MCH: 28.8 pg (ref 26.0–34.0)
MCHC: 31.3 g/dL (ref 30.0–36.0)
MCV: 91.9 fL (ref 80.0–100.0)
Monocytes Absolute: 0.4 10*3/uL (ref 0.1–1.0)
Monocytes Relative: 11 %
Neutro Abs: 2.2 10*3/uL (ref 1.7–7.7)
Neutrophils Relative %: 63 %
Platelets: 75 10*3/uL — ABNORMAL LOW (ref 150–400)
RBC: 3.44 MIL/uL — ABNORMAL LOW (ref 4.22–5.81)
RDW: 14.7 % (ref 11.5–15.5)
WBC: 3.5 10*3/uL — ABNORMAL LOW (ref 4.0–10.5)
nRBC: 0 % (ref 0.0–0.2)

## 2021-06-26 LAB — C-REACTIVE PROTEIN: CRP: 0.6 mg/dL (ref <1.0)

## 2021-06-26 LAB — SEDIMENTATION RATE: Sed Rate: 26 mm/hr — ABNORMAL HIGH (ref 0–20)

## 2021-06-26 LAB — GAMMA GT: GGT: 43 U/L (ref 7–50)

## 2021-06-27 ENCOUNTER — Other Ambulatory Visit: Payer: Self-pay | Admitting: Family Medicine

## 2021-06-27 DIAGNOSIS — D696 Thrombocytopenia, unspecified: Secondary | ICD-10-CM

## 2021-07-02 ENCOUNTER — Encounter (HOSPITAL_COMMUNITY): Payer: Self-pay | Admitting: Oncology

## 2021-07-02 ENCOUNTER — Telehealth: Payer: Self-pay

## 2021-07-02 LAB — ANTINUCLEAR ANTIBODIES, IFA: ANA Ab, IFA: NEGATIVE

## 2021-07-02 NOTE — Progress Notes (Signed)
Transition CCM to Self Care ? ?Attempted contact with patient 3 times on 03/14, 03/15 and 03/16. Unsuccessful outreach.  ? ?Patient contacted to inform they have achieved their CCM goals and no longer need to be contacted as frequently. Patient advised services will still be available to them if they would like to reach out or have any new health concerns. Verified patient had contact information to pharmacist and health concierge on hand. Patient made aware CCM services would be continued if desired. Patient consented to cancel future CCM appointments. ? ?Charlene Brooke, CPP notified ? ?Marijean Niemann, RMA ?Clinical Pharmacy Assistant ?[phone] ?

## 2021-07-12 ENCOUNTER — Telehealth: Payer: Self-pay

## 2021-07-12 DIAGNOSIS — R634 Abnormal weight loss: Secondary | ICD-10-CM

## 2021-07-12 NOTE — Telephone Encounter (Signed)
Please schedule patient for CT CAP- a few days prior to follow up on 4/5. We will give him appt info when we call him.  ?

## 2021-07-12 NOTE — Telephone Encounter (Signed)
Please see pts response

## 2021-07-12 NOTE — Telephone Encounter (Signed)
-----  Message from Earlie Server, MD sent at 07/12/2021 12:02 AM EDT ----- ?Please let him know that additional testing on his bone marrow biopsy result is good.  ?Recommend him to get CT chest abdomen pelvis w contrast [next available.for evaluation of unintentional weight loss, Shortness of breath. ?Image a few days before his visit with me in April ?

## 2021-07-12 NOTE — Telephone Encounter (Signed)
Unable to reach pt by phone. Mychart message sent informing pt of appt.  ? ?

## 2021-07-17 DIAGNOSIS — D0421 Carcinoma in situ of skin of right ear and external auricular canal: Secondary | ICD-10-CM | POA: Diagnosis not present

## 2021-07-19 ENCOUNTER — Other Ambulatory Visit: Payer: Self-pay

## 2021-07-19 ENCOUNTER — Ambulatory Visit
Admission: RE | Admit: 2021-07-19 | Discharge: 2021-07-19 | Disposition: A | Discharge status: Home / Self Care | Payer: Medicare HMO | Source: Ambulatory Visit | Attending: Oncology | Admitting: Oncology

## 2021-07-19 DIAGNOSIS — R609 Edema, unspecified: Secondary | ICD-10-CM | POA: Insufficient documentation

## 2021-07-19 DIAGNOSIS — R590 Localized enlarged lymph nodes: Secondary | ICD-10-CM | POA: Insufficient documentation

## 2021-07-19 DIAGNOSIS — R634 Abnormal weight loss: Secondary | ICD-10-CM | POA: Insufficient documentation

## 2021-07-19 DIAGNOSIS — R918 Other nonspecific abnormal finding of lung field: Secondary | ICD-10-CM | POA: Diagnosis not present

## 2021-07-19 DIAGNOSIS — I700 Atherosclerosis of aorta: Secondary | ICD-10-CM | POA: Insufficient documentation

## 2021-07-19 DIAGNOSIS — R161 Splenomegaly, not elsewhere classified: Secondary | ICD-10-CM | POA: Insufficient documentation

## 2021-07-19 DIAGNOSIS — K8020 Calculus of gallbladder without cholecystitis without obstruction: Secondary | ICD-10-CM | POA: Diagnosis not present

## 2021-07-19 DIAGNOSIS — J929 Pleural plaque without asbestos: Secondary | ICD-10-CM | POA: Diagnosis not present

## 2021-07-19 DIAGNOSIS — D696 Thrombocytopenia, unspecified: Secondary | ICD-10-CM | POA: Diagnosis not present

## 2021-07-19 DIAGNOSIS — J90 Pleural effusion, not elsewhere classified: Secondary | ICD-10-CM | POA: Diagnosis not present

## 2021-07-19 DIAGNOSIS — N3289 Other specified disorders of bladder: Secondary | ICD-10-CM | POA: Diagnosis not present

## 2021-07-19 LAB — POCT I-STAT CREATININE: Creatinine, Ser: 1.4 mg/dL — ABNORMAL HIGH (ref 0.61–1.24)

## 2021-07-19 MED ORDER — IOHEXOL 300 MG/ML  SOLN
100.0000 mL | Freq: Once | INTRAMUSCULAR | Status: AC | PRN
  Administered 2021-07-19: 100 mL via INTRAVENOUS

## 2021-07-22 LAB — SURGICAL PATHOLOGY

## 2021-07-23 ENCOUNTER — Ambulatory Visit: Payer: Medicare HMO

## 2021-07-24 ENCOUNTER — Inpatient Hospital Stay: Payer: Medicare HMO | Admitting: Oncology

## 2021-07-24 ENCOUNTER — Inpatient Hospital Stay: Payer: Medicare HMO | Attending: Oncology

## 2021-07-24 ENCOUNTER — Other Ambulatory Visit: Payer: Self-pay

## 2021-07-24 ENCOUNTER — Encounter: Payer: Self-pay | Admitting: Oncology

## 2021-07-24 VITALS — BP 181/56 | HR 56 | Temp 97.8°F | Ht 73.0 in | Wt 272.0 lb

## 2021-07-24 DIAGNOSIS — Z833 Family history of diabetes mellitus: Secondary | ICD-10-CM | POA: Diagnosis not present

## 2021-07-24 DIAGNOSIS — D509 Iron deficiency anemia, unspecified: Secondary | ICD-10-CM | POA: Insufficient documentation

## 2021-07-24 DIAGNOSIS — R634 Abnormal weight loss: Secondary | ICD-10-CM | POA: Diagnosis not present

## 2021-07-24 DIAGNOSIS — D61818 Other pancytopenia: Secondary | ICD-10-CM | POA: Insufficient documentation

## 2021-07-24 DIAGNOSIS — J189 Pneumonia, unspecified organism: Secondary | ICD-10-CM | POA: Diagnosis not present

## 2021-07-24 DIAGNOSIS — E119 Type 2 diabetes mellitus without complications: Secondary | ICD-10-CM | POA: Insufficient documentation

## 2021-07-24 DIAGNOSIS — Z79899 Other long term (current) drug therapy: Secondary | ICD-10-CM | POA: Insufficient documentation

## 2021-07-24 DIAGNOSIS — Z809 Family history of malignant neoplasm, unspecified: Secondary | ICD-10-CM | POA: Diagnosis not present

## 2021-07-24 DIAGNOSIS — I10 Essential (primary) hypertension: Secondary | ICD-10-CM | POA: Insufficient documentation

## 2021-07-24 DIAGNOSIS — Z808 Family history of malignant neoplasm of other organs or systems: Secondary | ICD-10-CM | POA: Insufficient documentation

## 2021-07-24 DIAGNOSIS — Z836 Family history of other diseases of the respiratory system: Secondary | ICD-10-CM | POA: Diagnosis not present

## 2021-07-24 DIAGNOSIS — D696 Thrombocytopenia, unspecified: Secondary | ICD-10-CM

## 2021-07-24 DIAGNOSIS — I700 Atherosclerosis of aorta: Secondary | ICD-10-CM | POA: Diagnosis not present

## 2021-07-24 DIAGNOSIS — K760 Fatty (change of) liver, not elsewhere classified: Secondary | ICD-10-CM | POA: Insufficient documentation

## 2021-07-24 DIAGNOSIS — R5383 Other fatigue: Secondary | ICD-10-CM | POA: Diagnosis not present

## 2021-07-24 DIAGNOSIS — R350 Frequency of micturition: Secondary | ICD-10-CM | POA: Insufficient documentation

## 2021-07-24 DIAGNOSIS — D649 Anemia, unspecified: Secondary | ICD-10-CM | POA: Insufficient documentation

## 2021-07-24 DIAGNOSIS — R748 Abnormal levels of other serum enzymes: Secondary | ICD-10-CM | POA: Insufficient documentation

## 2021-07-24 DIAGNOSIS — Z8249 Family history of ischemic heart disease and other diseases of the circulatory system: Secondary | ICD-10-CM | POA: Diagnosis not present

## 2021-07-24 DIAGNOSIS — D508 Other iron deficiency anemias: Secondary | ICD-10-CM

## 2021-07-24 DIAGNOSIS — Z818 Family history of other mental and behavioral disorders: Secondary | ICD-10-CM | POA: Diagnosis not present

## 2021-07-24 HISTORY — DX: Iron deficiency anemia, unspecified: D50.9

## 2021-07-24 LAB — CBC WITH DIFFERENTIAL/PLATELET
Abs Immature Granulocytes: 0.01 10*3/uL (ref 0.00–0.07)
Basophils Absolute: 0 10*3/uL (ref 0.0–0.1)
Basophils Relative: 1 %
Eosinophils Absolute: 0.3 10*3/uL (ref 0.0–0.5)
Eosinophils Relative: 6 %
HCT: 29.8 % — ABNORMAL LOW (ref 39.0–52.0)
Hemoglobin: 9.2 g/dL — ABNORMAL LOW (ref 13.0–17.0)
Immature Granulocytes: 0 %
Lymphocytes Relative: 17 %
Lymphs Abs: 0.8 10*3/uL (ref 0.7–4.0)
MCH: 26.7 pg (ref 26.0–34.0)
MCHC: 30.9 g/dL (ref 30.0–36.0)
MCV: 86.6 fL (ref 80.0–100.0)
Monocytes Absolute: 0.5 10*3/uL (ref 0.1–1.0)
Monocytes Relative: 11 %
Neutro Abs: 3.2 10*3/uL (ref 1.7–7.7)
Neutrophils Relative %: 65 %
Platelets: 106 10*3/uL — ABNORMAL LOW (ref 150–400)
RBC: 3.44 MIL/uL — ABNORMAL LOW (ref 4.22–5.81)
RDW: 14.6 % (ref 11.5–15.5)
WBC: 4.9 10*3/uL (ref 4.0–10.5)
nRBC: 0 % (ref 0.0–0.2)

## 2021-07-24 LAB — COMPREHENSIVE METABOLIC PANEL
ALT: 29 U/L (ref 0–44)
AST: 29 U/L (ref 15–41)
Albumin: 3.5 g/dL (ref 3.5–5.0)
Alkaline Phosphatase: 203 U/L — ABNORMAL HIGH (ref 38–126)
Anion gap: 7 (ref 5–15)
BUN: 30 mg/dL — ABNORMAL HIGH (ref 8–23)
CO2: 24 mmol/L (ref 22–32)
Calcium: 8.6 mg/dL — ABNORMAL LOW (ref 8.9–10.3)
Chloride: 100 mmol/L (ref 98–111)
Creatinine, Ser: 1.26 mg/dL — ABNORMAL HIGH (ref 0.61–1.24)
GFR, Estimated: >60 mL/min (ref >60)
Glucose, Bld: 238 mg/dL — ABNORMAL HIGH (ref 70–99)
Potassium: 4.2 mmol/L (ref 3.5–5.1)
Sodium: 131 mmol/L — ABNORMAL LOW (ref 135–145)
Total Bilirubin: 1.2 mg/dL (ref 0.3–1.2)
Total Protein: 6.8 g/dL (ref 6.5–8.1)

## 2021-07-24 LAB — SAMPLE TO BLOOD BANK

## 2021-07-24 NOTE — Progress Notes (Signed)
?Hematology/Oncology Progress note ?[phone] [phone] ?  ? ?   ? ? ?Patient Care Team: ?[name], MD as PCP - General (Family Medicine) ?Gearlean Alf, MD as Referring Physician (Ophthalmology) ?Nicholas Lose, MD as Consulting Physician (Hematology and Oncology) ?[name], [facility] as Pharmacist (Pharmacist) ? ?REFERRING PROVIDER: ?[name], MD  ?CHIEF COMPLAINTS/REASON FOR VISIT:  ?pancytopenia ? ?HISTORY OF PRESENTING ILLNESS:  ? ?Dennis Wise is a  71 y.o.  male with PMH listed below was seen in consultation at the request of  Damita Dunnings Elveria Rising, MD  for evaluation of pancytopenia ? ?03/06/2021, patient had blood work done.  CBC showed normal WBC of 4.6, hemoglobin 10.9, normal platelet count at 154,000.  Chemistry labs showed normal creatinine at 1.47 with a GFR of 47.3, normal bilirubin.  Potassium was elevated at 5.2. ? ?Patient reports a history of MGUS.  Initially diagnosed by oncology Dr. Humphrey Rolls. ?04/16/2021, kappa light chain ratio was elevated at 1.67.  04/17/2011, SPEP showed no monoclonal protein. ?04/25/2011 24-hour urine protein 578. ?04/25/2011 patient had a bone marrow biopsy and aspirate which showed a 3% of polyclonal plasma cells staining for kappa and lambda light chain and lack of large aggregates or sheets.  Overall changes are nonspecific. ? ?Patient L was seen by oncology Dr. Nicholas Lose who did not feel that patient meets diagnostic criteria for MGUS and the patient was discharged. ? ?Patient reports feeling well except chronic fatigue.  Patient had history of left lower extremity surgery due to fracture and he has chronic venous insufficiency. ?Patient denies any constitutional symptoms. ? ? ?INTERVAL HISTORY ?Dennis Wise is a 71 y.o. male who has above history reviewed by me today presents for follow up visit for management of pancytopenia.  ? Patient was accompanied by wife.  Chronic fatigue, unchanged ?Marland Kitchen  Frequent urination at night, every 2  hours.  No dysuria. ? ?Review of Systems  ?Constitutional:  Positive for fatigue. Negative for appetite change, chills, fever and unexpected weight change.  ?HENT:   Negative for hearing loss and voice change.   ?Eyes:  Negative for eye problems and icterus.  ?Respiratory:  Negative for chest tightness, cough and shortness of breath.   ?Cardiovascular:  Negative for chest pain and leg swelling.  ?Gastrointestinal:  Negative for abdominal distention and abdominal pain.  ?Endocrine: Negative for hot flashes.  ?Genitourinary:  Positive for frequency. Negative for difficulty urinating and dysuria.   ?Musculoskeletal:  Negative for arthralgias.  ?Skin:  Negative for itching and rash.  ?Neurological:  Negative for light-headedness and numbness.  ?Hematological:  Negative for adenopathy. Does not bruise/bleed easily.  ?Psychiatric/Behavioral:  Negative for confusion.   ? ?MEDICAL HISTORY:  ?Past Medical History:  ?Diagnosis Date  ? Anemia   ? Iron deficiency part of this  ? Cataract   ? bilateral lens implants  ? Diabetes mellitus   ? type II  ? GERD (gastroesophageal reflux disease) 01/1989  ? Helicobacter pylori gastritis 06/11/2011  ? On EGD 05/2011   ? Hyperlipidemia   ? Hypertension 11/1999  ? Internal hemorrhoids   ? Iron deficiency anemia, unspecified 03/04/2011  ? MGUS (monoclonal gammopathy of unknown significance)   ? possible dx initially, had negative f/u  ? Pancytopenia   ? Pancytopenia, acquired ([place])   ? Personal history of colonic adenomas 06/05/2012  ? Pneumonia   ? ? ?SURGICAL HISTORY: ?Past Surgical History:  ?Procedure Laterality Date  ? CATARACT EXTRACTION Bilateral   ? COLONOSCOPY    ?  FLEXIBLE SIGMOIDOSCOPY  05/1988  ? internal hemorrhoids  ? KNEE SURGERY  09/04  ? lt knee fx repair ORIF  ? TIBIA FRACTURE SURGERY    ? left 2012  ? ? ?SOCIAL HISTORY: ?Social History  ? ?Socioeconomic History  ? Marital status: Married  ?  Spouse name: Not on file  ? Number of children: Not on file  ? Years of education:  Not on file  ? Highest education level: Not on file  ?Occupational History  ? Not on file  ?Tobacco Use  ? Smoking status: Never  ? Smokeless tobacco: Never  ?Vaping Use  ? Vaping Use: Never used  ?Substance and Sexual Activity  ? Alcohol use: Never  ? Drug use: No  ? Sexual activity: Yes  ?Other Topics Concern  ? Not on file  ?Social History Narrative  ? Prev traveling for sports broadcasting   ? Working for [employer], [employer], [employer] as of 2019  ? Married 1970  ? 1 child  ? 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  ? ?Social Determinants of Health  ? ?Financial Resource Strain: Low Risk   ? Difficulty of Paying Living Expenses: Not hard at all  ?Food Insecurity: No Food Insecurity  ? Worried About Charity fundraiser in the Last Year: Never true  ? Ran Out of Food in the Last Year: Never true  ?Transportation Needs: No Transportation Needs  ? Lack of Transportation (Medical): No  ? Lack of Transportation (Non-Medical): No  ?Physical Activity: Unknown  ? Days of Exercise per Week: 7 days  ? Minutes of Exercise per Session: Not on file  ?Stress: No Stress Concern Present  ? Feeling of Stress : Not at all  ?Social Connections: Moderately Isolated  ? Frequency of Communication with Friends and Family: Twice a week  ? Frequency of Social Gatherings with Friends and Family: More than three times a week  ? Attends Religious Services: Never  ? Active Member of Clubs or Organizations: No  ? Attends Archivist Meetings: Never  ? Marital Status: Married  ?Intimate Partner Violence: Not At Risk  ? Fear of Current or Ex-Partner: No  ? Emotionally Abused: No  ? Physically Abused: No  ? Sexually Abused: No  ? ? ?FAMILY HISTORY: ?Family History  ?Problem Relation Age of Onset  ? Cancer Mother   ?     ?  ? Diabetes Mother   ? Heart failure Mother   ? Emphysema Father   ? Cancer Father   ?     ?  ? Alzheimer's disease Father   ? Skin cancer Brother   ? Diabetes Brother   ? Colon cancer Neg Hx   ? Prostate cancer Neg Hx   ? Esophageal cancer  Neg Hx   ? Rectal cancer Neg Hx   ? Stomach cancer Neg Hx   ? ? ?ALLERGIES:  is allergic to promethazine hcl and other. ? ?MEDICATIONS:  ?Current Outpatient Medications  ?Medication Sig Dispense Refill  ? ACCU-CHEK FASTCLIX LANCETS MISC Use to test blood sugar once daily or as needed.  Diagnosis:  E11.3299  Non insulin dependent. 102 each 3  ? aspirin 81 MG tablet Take 81 mg by mouth daily.    ? atorvastatin (LIPITOR) 10 MG tablet Take 1 tablet (10 mg total) by mouth at bedtime. 90 tablet 3  ? Blood Glucose Monitoring Suppl (ACCU-CHEK NANO SMARTVIEW) w/Device KIT Use to check blood sugar once daily or as needed.  Diagnosis: E11.3299  Non insulin  dependent. 1 kit 0  ? glimepiride (AMARYL) 2 MG tablet 1 tab a day 90 tablet 3  ? glucose blood (ACCU-CHEK SMARTVIEW) test strip Use as instructed to test blood sugar once daily or as needed.  Diagnosis:  E11.3299  Non insulin dependent. 100 each 3  ? metFORMIN (GLUCOPHAGE) 850 MG tablet Take 1 tablet (850 mg total) by mouth 2 (two) times daily. 180 tablet 3  ? Multiple Vitamin (MULTIVITAMIN) tablet Take 1 tablet by mouth daily. Centrum Silver    ? niacin (NIASPAN) 500 MG CR tablet TAKE 3 TABLETS AT BEDTIME 270 tablet 2  ? pioglitazone (ACTOS) 45 MG tablet Take 1 tablet (45 mg total) by mouth daily. 90 tablet 3  ? ramipril (ALTACE) 5 MG capsule Take 1 capsule (5 mg total) by mouth daily. 90 capsule 3  ? hydrochlorothiazide (HYDRODIURIL) 12.5 MG tablet Take 1 tablet (12.5 mg total) by mouth daily. 90 tablet 3  ? ?No current facility-administered medications for this visit.  ? ? ? ?PHYSICAL EXAMINATION: ?ECOG PERFORMANCE STATUS: 1 - Symptomatic but completely ambulatory ?Vitals:  ? 07/24/21 1054  ?BP: (!) 181/56  ?Pulse: (!) 56  ?Temp: 97.8 ?F (36.6 ?C)  ? ?Filed Weights  ? 07/24/21 1054  ?Weight: 272 lb (123.4 kg)  ? ? ?Physical Exam ?Constitutional:   ?   General: He is not in acute distress. ?HENT:  ?   Head: Normocephalic and atraumatic.  ?Eyes:  ?   General: No scleral  icterus. ?Cardiovascular:  ?   Rate and Rhythm: Normal rate and regular rhythm.  ?   Heart sounds: Normal heart sounds.  ?Pulmonary:  ?   Effort: Pulmonary effort is normal. No respiratory distress.  ?

## 2021-07-25 ENCOUNTER — Telehealth: Payer: Self-pay

## 2021-07-25 NOTE — Telephone Encounter (Signed)
Referral to Dr Posey Pronto at [facility] placed. Last encounter note, demographics and insurance faxed.  ?

## 2021-07-31 ENCOUNTER — Inpatient Hospital Stay: Payer: Medicare HMO

## 2021-07-31 VITALS — BP 151/62 | HR 50 | Temp 97.0°F | Resp 17

## 2021-07-31 DIAGNOSIS — I700 Atherosclerosis of aorta: Secondary | ICD-10-CM | POA: Diagnosis not present

## 2021-07-31 DIAGNOSIS — D649 Anemia, unspecified: Secondary | ICD-10-CM | POA: Diagnosis not present

## 2021-07-31 DIAGNOSIS — D508 Other iron deficiency anemias: Secondary | ICD-10-CM

## 2021-07-31 DIAGNOSIS — R350 Frequency of micturition: Secondary | ICD-10-CM | POA: Diagnosis not present

## 2021-07-31 DIAGNOSIS — R748 Abnormal levels of other serum enzymes: Secondary | ICD-10-CM | POA: Diagnosis not present

## 2021-07-31 DIAGNOSIS — K760 Fatty (change of) liver, not elsewhere classified: Secondary | ICD-10-CM | POA: Diagnosis not present

## 2021-07-31 DIAGNOSIS — E119 Type 2 diabetes mellitus without complications: Secondary | ICD-10-CM | POA: Diagnosis not present

## 2021-07-31 DIAGNOSIS — I10 Essential (primary) hypertension: Secondary | ICD-10-CM | POA: Diagnosis not present

## 2021-07-31 DIAGNOSIS — D61818 Other pancytopenia: Secondary | ICD-10-CM | POA: Diagnosis not present

## 2021-07-31 DIAGNOSIS — R5383 Other fatigue: Secondary | ICD-10-CM | POA: Diagnosis not present

## 2021-07-31 MED ORDER — SODIUM CHLORIDE 0.9 % IV SOLN: 200.0000 mg | Freq: Once | INTRAVENOUS | Status: DC

## 2021-07-31 MED ORDER — IRON SUCROSE 20 MG/ML IV SOLN
200.0000 mg | Freq: Once | INTRAVENOUS | Status: AC
  Administered 2021-07-31: 200 mg via INTRAVENOUS
  Filled 2021-07-31: qty 10

## 2021-07-31 MED ORDER — SODIUM CHLORIDE 0.9 % IV SOLN
Freq: Once | INTRAVENOUS | Status: AC
  Filled 2021-07-31: qty 250

## 2021-07-31 NOTE — Patient Instructions (Signed)

## 2021-07-31 NOTE — Progress Notes (Signed)
Patient tolerated first Venofer infusion well today, no concerns voiced. Patient monitored 30 min post transfusion. Patient questioned taking oral Iron. Per MD stop oral and resume after IV treatments. Patient aware, verbalizes understanding. Stable at discharge. AVS given.   ?

## 2021-08-07 ENCOUNTER — Inpatient Hospital Stay: Payer: Medicare HMO

## 2021-08-07 ENCOUNTER — Telehealth: Payer: Medicare HMO

## 2021-08-09 ENCOUNTER — Inpatient Hospital Stay: Payer: Medicare HMO

## 2021-08-09 VITALS — BP 159/55 | HR 48 | Temp 96.4°F | Resp 16

## 2021-08-09 DIAGNOSIS — D649 Anemia, unspecified: Secondary | ICD-10-CM | POA: Diagnosis not present

## 2021-08-09 DIAGNOSIS — I10 Essential (primary) hypertension: Secondary | ICD-10-CM | POA: Diagnosis not present

## 2021-08-09 DIAGNOSIS — D61818 Other pancytopenia: Secondary | ICD-10-CM | POA: Diagnosis not present

## 2021-08-09 DIAGNOSIS — R748 Abnormal levels of other serum enzymes: Secondary | ICD-10-CM | POA: Diagnosis not present

## 2021-08-09 DIAGNOSIS — E119 Type 2 diabetes mellitus without complications: Secondary | ICD-10-CM | POA: Diagnosis not present

## 2021-08-09 DIAGNOSIS — K760 Fatty (change of) liver, not elsewhere classified: Secondary | ICD-10-CM | POA: Diagnosis not present

## 2021-08-09 DIAGNOSIS — R5383 Other fatigue: Secondary | ICD-10-CM | POA: Diagnosis not present

## 2021-08-09 DIAGNOSIS — R350 Frequency of micturition: Secondary | ICD-10-CM | POA: Diagnosis not present

## 2021-08-09 DIAGNOSIS — D508 Other iron deficiency anemias: Secondary | ICD-10-CM

## 2021-08-09 DIAGNOSIS — I700 Atherosclerosis of aorta: Secondary | ICD-10-CM | POA: Diagnosis not present

## 2021-08-09 MED ORDER — SODIUM CHLORIDE 0.9 % IV SOLN: 200.0000 mg | Freq: Once | INTRAVENOUS | Status: DC

## 2021-08-09 MED ORDER — IRON SUCROSE 20 MG/ML IV SOLN
200.0000 mg | Freq: Once | INTRAVENOUS | Status: AC
  Administered 2021-08-09: 200 mg via INTRAVENOUS
  Filled 2021-08-09: qty 10

## 2021-08-09 MED ORDER — SODIUM CHLORIDE 0.9 % IV SOLN
Freq: Once | INTRAVENOUS | Status: AC
  Filled 2021-08-09: qty 250

## 2021-08-15 ENCOUNTER — Encounter: Payer: Self-pay | Admitting: Family Medicine

## 2021-08-15 ENCOUNTER — Ambulatory Visit (INDEPENDENT_AMBULATORY_CARE_PROVIDER_SITE_OTHER): Payer: Medicare HMO | Admitting: Family Medicine

## 2021-08-15 VITALS — BP 124/60 | HR 56 | Temp 97.7°F | Ht 73.0 in | Wt 277.0 lb

## 2021-08-15 DIAGNOSIS — I10 Essential (primary) hypertension: Secondary | ICD-10-CM

## 2021-08-15 DIAGNOSIS — R351 Nocturia: Secondary | ICD-10-CM

## 2021-08-15 DIAGNOSIS — D649 Anemia, unspecified: Secondary | ICD-10-CM

## 2021-08-15 LAB — URINALYSIS, ROUTINE W REFLEX MICROSCOPIC
Bilirubin Urine: NEGATIVE
Hgb urine dipstick: NEGATIVE
Ketones, ur: NEGATIVE
Leukocytes,Ua: NEGATIVE
Nitrite: NEGATIVE
Specific Gravity, Urine: 1.015 (ref 1.000–1.030)
Total Protein, Urine: 100 — AB
Urine Glucose: NEGATIVE
Urobilinogen, UA: 0.2 (ref 0.0–1.0)
pH: 6 (ref 5.0–8.0)

## 2021-08-15 LAB — PSA, MEDICARE: PSA: 0.57 ng/ml (ref 0.10–4.00)

## 2021-08-15 MED ORDER — TAMSULOSIN HCL 0.4 MG PO CAPS: 0.4000 mg | ORAL_CAPSULE | Freq: Every day | ORAL | 12 refills | Status: DC

## 2021-08-15 MED ORDER — NIACIN ER (ANTIHYPERLIPIDEMIC) 500 MG PO TBCR: 1500.0000 mg | EXTENDED_RELEASE_TABLET | Freq: Every day | ORAL | Status: DC

## 2021-08-15 NOTE — Progress Notes (Signed)
He had recent iron infusion.  D/w pt.   ? ?I told patient I would check on referral to rheumatology- Dr. Posey Pronto.  Note sent a referral coordinator.  Discussed rationale for rheumatology referral, to see if there was an autoimmune issue contributing to his current symptoms. ? ?He was prev able to walk miles but he gets winded walking now.  He has anemia that likely contributes.  He feels better after iron infusion.  He is up to date on colonoscopy.  No blood seen in stool.  He can get lightheaded on standing quickly, which could go along with anemia. ? ?Nocturia.  Q2 hours sx.  Recent CT with "Reproductive: Prostate is unremarkable. Symmetric wall thickening of an incompletely distended urinary bladder, which may be secondary to chronic outlet obstruction. ?Correlate with urinalysis to exclude cystitis."  Stream is still okay.  He doesn't have daytime urgency.   ? ?Meds, vitals, and allergies reviewed.  ? ?ROS: Per HPI unless specifically indicated in ROS section  ? ?GEN: nad, alert and oriented ?HEENT: ncat ?NECK: supple w/o LA ?PULM: ctab, no inc wob ?ABD: soft, +bs ?EXT: no edema ?SKIN: Well-perfused. ? ?32 minutes were devoted to patient care in this encounter (this includes time spent reviewing the patient's file/history, interviewing and examining the patient, counseling/reviewing plan with patient).  ? ?

## 2021-08-15 NOTE — Patient Instructions (Addendum)
Go to the lab on the way out.   If you have mychart we'll likely use that to update you.    ? ? ?Hold ramipril and see if you feel better on standing.   ?Take care.  Glad to see you. ?I'll check on the rheumatology referral.   ? ?You could try flomax if needed.  Caution on getting lightheaded.   ?

## 2021-08-18 DIAGNOSIS — R351 Nocturia: Secondary | ICD-10-CM

## 2021-08-18 HISTORY — DX: Nocturia: R35.1

## 2021-08-18 NOTE — Assessment & Plan Note (Signed)
Likely overtreated in the setting of relative anemia. ? ?I asked him to hold ramipril and see if he feels better on standing.   ?

## 2021-08-18 NOTE — Assessment & Plan Note (Signed)
Discussed options.  See notes on PSA. ?He could try flomax if needed.  Caution on getting lightheaded.   ?

## 2021-08-18 NOTE — Assessment & Plan Note (Signed)
With previous hematology evaluation done and rheumatology referral pending.  I sent a note to the referral department about his rheumatology referral.  Discussed rationale for referral, to see if that was an autoimmune issue contributing to his current situation. ?

## 2021-08-30 ENCOUNTER — Other Ambulatory Visit: Payer: Self-pay

## 2021-09-03 DIAGNOSIS — R700 Elevated erythrocyte sedimentation rate: Secondary | ICD-10-CM | POA: Diagnosis not present

## 2021-09-03 DIAGNOSIS — D696 Thrombocytopenia, unspecified: Secondary | ICD-10-CM | POA: Diagnosis not present

## 2021-09-03 DIAGNOSIS — R9389 Abnormal findings on diagnostic imaging of other specified body structures: Secondary | ICD-10-CM | POA: Diagnosis not present

## 2021-09-03 DIAGNOSIS — R590 Localized enlarged lymph nodes: Secondary | ICD-10-CM | POA: Diagnosis not present

## 2021-09-09 ENCOUNTER — Inpatient Hospital Stay: Payer: Medicare HMO | Attending: Oncology

## 2021-09-09 DIAGNOSIS — R590 Localized enlarged lymph nodes: Secondary | ICD-10-CM | POA: Diagnosis not present

## 2021-09-09 DIAGNOSIS — N189 Chronic kidney disease, unspecified: Secondary | ICD-10-CM | POA: Insufficient documentation

## 2021-09-09 DIAGNOSIS — R5383 Other fatigue: Secondary | ICD-10-CM | POA: Diagnosis not present

## 2021-09-09 DIAGNOSIS — Z8249 Family history of ischemic heart disease and other diseases of the circulatory system: Secondary | ICD-10-CM | POA: Insufficient documentation

## 2021-09-09 DIAGNOSIS — Z808 Family history of malignant neoplasm of other organs or systems: Secondary | ICD-10-CM | POA: Insufficient documentation

## 2021-09-09 DIAGNOSIS — I129 Hypertensive chronic kidney disease with stage 1 through stage 4 chronic kidney disease, or unspecified chronic kidney disease: Secondary | ICD-10-CM | POA: Diagnosis not present

## 2021-09-09 DIAGNOSIS — D631 Anemia in chronic kidney disease: Secondary | ICD-10-CM | POA: Diagnosis not present

## 2021-09-09 DIAGNOSIS — Z833 Family history of diabetes mellitus: Secondary | ICD-10-CM | POA: Insufficient documentation

## 2021-09-09 DIAGNOSIS — D72820 Lymphocytosis (symptomatic): Secondary | ICD-10-CM | POA: Insufficient documentation

## 2021-09-09 DIAGNOSIS — D61818 Other pancytopenia: Secondary | ICD-10-CM | POA: Insufficient documentation

## 2021-09-09 DIAGNOSIS — R350 Frequency of micturition: Secondary | ICD-10-CM | POA: Diagnosis not present

## 2021-09-09 DIAGNOSIS — E1122 Type 2 diabetes mellitus with diabetic chronic kidney disease: Secondary | ICD-10-CM | POA: Diagnosis not present

## 2021-09-09 DIAGNOSIS — Z809 Family history of malignant neoplasm, unspecified: Secondary | ICD-10-CM | POA: Insufficient documentation

## 2021-09-09 DIAGNOSIS — Z836 Family history of other diseases of the respiratory system: Secondary | ICD-10-CM | POA: Insufficient documentation

## 2021-09-09 DIAGNOSIS — Z818 Family history of other mental and behavioral disorders: Secondary | ICD-10-CM | POA: Insufficient documentation

## 2021-09-09 DIAGNOSIS — D696 Thrombocytopenia, unspecified: Secondary | ICD-10-CM

## 2021-09-09 DIAGNOSIS — Z8719 Personal history of other diseases of the digestive system: Secondary | ICD-10-CM | POA: Diagnosis not present

## 2021-09-09 DIAGNOSIS — Z79899 Other long term (current) drug therapy: Secondary | ICD-10-CM | POA: Insufficient documentation

## 2021-09-09 DIAGNOSIS — E611 Iron deficiency: Secondary | ICD-10-CM | POA: Diagnosis not present

## 2021-09-09 DIAGNOSIS — R0600 Dyspnea, unspecified: Secondary | ICD-10-CM | POA: Diagnosis not present

## 2021-09-09 LAB — IRON AND TIBC
Iron: 54 ug/dL (ref 45–182)
Saturation Ratios: 15 % — ABNORMAL LOW (ref 17.9–39.5)
TIBC: 363 ug/dL (ref 250–450)
UIBC: 309 ug/dL

## 2021-09-09 LAB — CBC WITH DIFFERENTIAL/PLATELET
Abs Immature Granulocytes: 0.04 10*3/uL (ref 0.00–0.07)
Basophils Absolute: 0 10*3/uL (ref 0.0–0.1)
Basophils Relative: 1 %
Eosinophils Absolute: 0.1 10*3/uL (ref 0.0–0.5)
Eosinophils Relative: 2 %
HCT: 32.5 % — ABNORMAL LOW (ref 39.0–52.0)
Hemoglobin: 10.3 g/dL — ABNORMAL LOW (ref 13.0–17.0)
Immature Granulocytes: 1 %
Lymphocytes Relative: 16 %
Lymphs Abs: 0.6 10*3/uL — ABNORMAL LOW (ref 0.7–4.0)
MCH: 28.7 pg (ref 26.0–34.0)
MCHC: 31.7 g/dL (ref 30.0–36.0)
MCV: 90.5 fL (ref 80.0–100.0)
Monocytes Absolute: 0.4 10*3/uL (ref 0.1–1.0)
Monocytes Relative: 10 %
Neutro Abs: 2.7 10*3/uL (ref 1.7–7.7)
Neutrophils Relative %: 70 %
Platelets: 81 10*3/uL — ABNORMAL LOW (ref 150–400)
RBC: 3.59 MIL/uL — ABNORMAL LOW (ref 4.22–5.81)
RDW: 18.5 % — ABNORMAL HIGH (ref 11.5–15.5)
WBC: 3.9 10*3/uL — ABNORMAL LOW (ref 4.0–10.5)
nRBC: 0 % (ref 0.0–0.2)

## 2021-09-09 LAB — FERRITIN: Ferritin: 67 ng/mL (ref 24–336)

## 2021-09-09 LAB — COMPREHENSIVE METABOLIC PANEL
ALT: 26 U/L (ref 0–44)
AST: 26 U/L (ref 15–41)
Albumin: 3.7 g/dL (ref 3.5–5.0)
Alkaline Phosphatase: 198 U/L — ABNORMAL HIGH (ref 38–126)
Anion gap: 5 (ref 5–15)
BUN: 31 mg/dL — ABNORMAL HIGH (ref 8–23)
CO2: 28 mmol/L (ref 22–32)
Calcium: 8.5 mg/dL — ABNORMAL LOW (ref 8.9–10.3)
Chloride: 102 mmol/L (ref 98–111)
Creatinine, Ser: 1.27 mg/dL — ABNORMAL HIGH (ref 0.61–1.24)
GFR, Estimated: 60 mL/min — ABNORMAL LOW (ref >60)
Glucose, Bld: 168 mg/dL — ABNORMAL HIGH (ref 70–99)
Potassium: 4.2 mmol/L (ref 3.5–5.1)
Sodium: 135 mmol/L (ref 135–145)
Total Bilirubin: 1.5 mg/dL — ABNORMAL HIGH (ref 0.3–1.2)
Total Protein: 6.9 g/dL (ref 6.5–8.1)

## 2021-09-09 LAB — RETIC PANEL
Immature Retic Fract: 25.5 % — ABNORMAL HIGH (ref 2.3–15.9)
RBC.: 3.59 MIL/uL — ABNORMAL LOW (ref 4.22–5.81)
Retic Count, Absolute: 137.9 10*3/uL (ref 19.0–186.0)
Retic Ct Pct: 3.8 % — ABNORMAL HIGH (ref 0.4–3.1)
Reticulocyte Hemoglobin: 32 pg (ref >27.9)

## 2021-09-09 LAB — SAMPLE TO BLOOD BANK

## 2021-09-10 ENCOUNTER — Inpatient Hospital Stay: Payer: Medicare HMO | Admitting: Oncology

## 2021-09-10 ENCOUNTER — Inpatient Hospital Stay: Payer: Medicare HMO

## 2021-09-10 ENCOUNTER — Encounter: Payer: Self-pay | Admitting: Oncology

## 2021-09-10 VITALS — BP 173/66 | HR 56 | Temp 97.6°F | Resp 18 | Wt 282.6 lb

## 2021-09-10 DIAGNOSIS — D61818 Other pancytopenia: Secondary | ICD-10-CM | POA: Diagnosis not present

## 2021-09-10 DIAGNOSIS — K760 Fatty (change of) liver, not elsewhere classified: Secondary | ICD-10-CM | POA: Diagnosis not present

## 2021-09-10 DIAGNOSIS — D508 Other iron deficiency anemias: Secondary | ICD-10-CM

## 2021-09-10 DIAGNOSIS — R350 Frequency of micturition: Secondary | ICD-10-CM | POA: Diagnosis not present

## 2021-09-10 DIAGNOSIS — D72820 Lymphocytosis (symptomatic): Secondary | ICD-10-CM

## 2021-09-10 DIAGNOSIS — D696 Thrombocytopenia, unspecified: Secondary | ICD-10-CM

## 2021-09-10 DIAGNOSIS — J189 Pneumonia, unspecified organism: Secondary | ICD-10-CM

## 2021-09-10 DIAGNOSIS — N189 Chronic kidney disease, unspecified: Secondary | ICD-10-CM | POA: Diagnosis not present

## 2021-09-10 DIAGNOSIS — R5383 Other fatigue: Secondary | ICD-10-CM | POA: Diagnosis not present

## 2021-09-10 DIAGNOSIS — R590 Localized enlarged lymph nodes: Secondary | ICD-10-CM | POA: Diagnosis not present

## 2021-09-10 DIAGNOSIS — I129 Hypertensive chronic kidney disease with stage 1 through stage 4 chronic kidney disease, or unspecified chronic kidney disease: Secondary | ICD-10-CM | POA: Diagnosis not present

## 2021-09-10 DIAGNOSIS — R748 Abnormal levels of other serum enzymes: Secondary | ICD-10-CM | POA: Diagnosis not present

## 2021-09-10 DIAGNOSIS — E611 Iron deficiency: Secondary | ICD-10-CM | POA: Diagnosis not present

## 2021-09-10 DIAGNOSIS — D631 Anemia in chronic kidney disease: Secondary | ICD-10-CM | POA: Diagnosis not present

## 2021-09-10 MED ORDER — IRON SUCROSE 20 MG/ML IV SOLN
200.0000 mg | Freq: Once | INTRAVENOUS | Status: AC
  Administered 2021-09-10: 200 mg via INTRAVENOUS
  Filled 2021-09-10: qty 10

## 2021-09-10 MED ORDER — SODIUM CHLORIDE 0.9 % IV SOLN: 200.0000 mg | Freq: Once | INTRAVENOUS | Status: DC

## 2021-09-10 MED ORDER — SODIUM CHLORIDE 0.9 % IV SOLN
Freq: Once | INTRAVENOUS | Status: AC
  Filled 2021-09-10: qty 250

## 2021-09-10 NOTE — Progress Notes (Signed)
Hematology/Oncology Progress note [phone] [phone]         Patient Care Team: [name], MD as PCP - General (Family Medicine) Gearlean Alf, MD as Referring Physician (Ophthalmology) Nicholas Lose, MD as Consulting Physician (Hematology and Oncology) [name], [facility] as Pharmacist (Pharmacist)  REFERRING PROVIDER: [name], MD  CHIEF COMPLAINTS/REASON FOR VISIT:  pancytopenia  HISTORY OF PRESENTING ILLNESS:   Dennis Wise is a  71 y.o.  male with PMH listed below was seen in consultation at the request of  [name], MD  for evaluation of pancytopenia  03/06/2021, patient had blood work done.  CBC showed normal WBC of 4.6, hemoglobin 10.9, normal platelet count at 154,000.  Chemistry labs showed normal creatinine at 1.47 with a GFR of 47.3, normal bilirubin.  Potassium was elevated at 5.2.  Patient reports a history of MGUS.  Initially diagnosed by oncology Dr. Humphrey Rolls. 04/16/2021, kappa light chain ratio was elevated at 1.67.  04/17/2011, SPEP showed no monoclonal protein. 04/25/2011 24-hour urine protein 578. 04/25/2011 patient had a bone marrow biopsy and aspirate which showed a 3% of polyclonal plasma cells staining for kappa and lambda light chain and lack of large aggregates or sheets.  Overall changes are nonspecific.  Patient L was seen by oncology Dr. Nicholas Lose who did not feel that patient meets diagnostic criteria for MGUS and the patient was discharged.  06/04/2021, bone marrow biopsy showed hypercellular marrow with mild erythroid atypia.  Morphology features do not provide a precise explanation for patient's cytopenia.  Absence of significant morphological dysplasia makes an MDS less likely and alternative causes differential is wide.  B-cell gene rearrangement was detected.  This is likely secondary to monoclonal lymphocytosis.  And T-cell gene arrangement were detected-this is likely reactive  MDS FISH panel is  negative. NGS negative.  07/19/2021, CT images were reviewed and discussed with patient. -Patient has prominent/mildly enlarged mediastinal hilar and retroperitoneal lymph nodes.  Nonspecific.  Reactive versus malignant.-  INTERVAL HISTORY Dennis Wise is a 71 y.o. male who has above history reviewed by me today presents for follow up visit for management of pancytopenia.  Patient was accompanied by wife.  Chronic fatigue, unchanged During the interval, patient has been seen by primary care provider and was started on Flomax. Patient has establish care with rheumatology Dr. Posey Pronto and had autoimmune disease work-up.  RF is elevated.  Patient establish care with pulmonology Dr. Lanney Gins for abnormal CT findings.  Differential includes [name], hypersensitivity pneumonitis.  Dr. Lanney Gins also refer patient to establish care with cardiology.   Review of Systems  Constitutional:  Positive for fatigue. Negative for appetite change, chills, fever and unexpected weight change.  HENT:   Negative for hearing loss and voice change.   Eyes:  Negative for eye problems and icterus.  Respiratory:  Negative for chest tightness, cough and shortness of breath.   Cardiovascular:  Negative for chest pain and leg swelling.  Gastrointestinal:  Negative for abdominal distention and abdominal pain.  Endocrine: Negative for hot flashes.  Genitourinary:  Positive for frequency. Negative for difficulty urinating and dysuria.   Musculoskeletal:  Negative for arthralgias.  Skin:  Negative for itching and rash.  Neurological:  Negative for light-headedness and numbness.  Hematological:  Negative for adenopathy. Does not bruise/bleed easily.  Psychiatric/Behavioral:  Negative for confusion.    MEDICAL HISTORY:  Past Medical History:  Diagnosis Date   Anemia    Iron deficiency part of this  Cataract    bilateral lens implants   Diabetes mellitus    type II   GERD (gastroesophageal reflux disease)  85/0277   Helicobacter pylori gastritis 06/11/2011   On EGD 05/2011    Hyperlipidemia    Hypertension 11/1999   IDA (iron deficiency anemia) 07/24/2021   Internal hemorrhoids    Iron deficiency anemia, unspecified 03/04/2011   MGUS (monoclonal gammopathy of unknown significance)    possible dx initially, had negative f/u   Pancytopenia    Pancytopenia, acquired ([place])    Personal history of colonic adenomas 06/05/2012   Pneumonia     SURGICAL HISTORY: Past Surgical History:  Procedure Laterality Date   CATARACT EXTRACTION Bilateral    COLONOSCOPY     FLEXIBLE SIGMOIDOSCOPY  05/1988   internal hemorrhoids   KNEE SURGERY  09/04   lt knee fx repair ORIF   TIBIA FRACTURE SURGERY     left 2012    SOCIAL HISTORY: Social History   Socioeconomic History   Marital status: Married    Spouse name: Not on file   Number of children: Not on file   Years of education: Not on file   Highest education level: Not on file  Occupational History   Not on file  Tobacco Use   Smoking status: Never   Smokeless tobacco: Never  Vaping Use   Vaping Use: Never used  Substance and Sexual Activity   Alcohol use: Never   Drug use: No   Sexual activity: Yes  Other Topics Concern   Not on file  Social History Narrative   Prev traveling for sports broadcasting    Working for [employer], [place], [employer] etc as of 2019   Married 1970   1 child   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   Social Determinants of Health   Financial Resource Strain: Low Risk    Difficulty of Paying Living Expenses: Not hard at all  Food Insecurity: No Food Insecurity   Worried About Charity fundraiser in the Last Year: Never true   [place] in the Last Year: Never true  Transportation Needs: No Transportation Needs   Lack of Transportation (Medical): No   Lack of Transportation (Non-Medical): No  Physical Activity: Unknown   Days of Exercise per Week: 7 days   Minutes of Exercise per Session: Not on file  Stress: No Stress  Concern Present   Feeling of Stress : Not at all  Social Connections: Moderately Isolated   Frequency of Communication with Friends and Family: Twice a week   Frequency of Social Gatherings with Friends and Family: More than three times a week   Attends Religious Services: Never   Marine scientist or Organizations: No   Attends Music therapist: Never   Marital Status: Married  Human resources officer Violence: Not At Risk   Fear of Current or Ex-Partner: No   Emotionally Abused: No   Physically Abused: No   Sexually Abused: No    FAMILY HISTORY: Family History  Problem Relation Age of Onset   Cancer Mother        ?   Diabetes Mother    Heart failure Mother    Emphysema Father    Cancer Father        ?   Alzheimer's disease Father    Skin cancer Brother    Diabetes Brother    Colon cancer Neg Hx    Prostate cancer Neg Hx    Esophageal cancer Neg Hx  Rectal cancer Neg Hx    Stomach cancer Neg Hx     ALLERGIES:  is allergic to promethazine hcl and other.  MEDICATIONS:  Current Outpatient Medications  Medication Sig Dispense Refill   ACCU-CHEK FASTCLIX LANCETS MISC Use to test blood sugar once daily or as needed.  Diagnosis:  E11.3299  Non insulin dependent. 102 each 3   aspirin 81 MG tablet Take 81 mg by mouth daily.     atorvastatin (LIPITOR) 10 MG tablet Take 1 tablet (10 mg total) by mouth at bedtime. 90 tablet 3   Blood Glucose Monitoring Suppl (ACCU-CHEK NANO SMARTVIEW) w/Device KIT Use to check blood sugar once daily or as needed.  Diagnosis: E11.3299  Non insulin dependent. 1 kit 0   glimepiride (AMARYL) 2 MG tablet 1 tab a day 90 tablet 3   glucose blood (ACCU-CHEK SMARTVIEW) test strip Use as instructed to test blood sugar once daily or as needed.  Diagnosis:  E11.3299  Non insulin dependent. 100 each 3   metFORMIN (GLUCOPHAGE) 850 MG tablet Take 1 tablet (850 mg total) by mouth 2 (two) times daily. 180 tablet 3   Multiple Vitamin (MULTIVITAMIN)  tablet Take 1 tablet by mouth daily. Centrum Silver     niacin (NIASPAN) 500 MG CR tablet Take 3 tablets (1,500 mg total) by mouth daily.     pioglitazone (ACTOS) 45 MG tablet Take 1 tablet (45 mg total) by mouth daily. 90 tablet 3   predniSONE (DELTASONE) 10 MG tablet Take 10 mg by mouth daily.     tamsulosin (FLOMAX) 0.4 MG CAPS capsule Take 1 capsule (0.4 mg total) by mouth daily. 30 capsule 12   torsemide (DEMADEX) 10 MG tablet Take 10 mg by mouth daily.     No current facility-administered medications for this visit.     PHYSICAL EXAMINATION: ECOG PERFORMANCE STATUS: 1 - Symptomatic but completely ambulatory Vitals:   09/10/21 1329  BP: (!) 173/66  Pulse: (!) 56  Resp: 18  Temp: 97.6 F (36.4 C)   Filed Weights   09/10/21 1329  Weight: 282 lb 9.6 oz (128.2 kg)    Physical Exam Constitutional:      General: He is not in acute distress.    Appearance: He is obese.  HENT:     Head: Normocephalic and atraumatic.  Eyes:     General: No scleral icterus. Cardiovascular:     Rate and Rhythm: Normal rate and regular rhythm.     Heart sounds: Normal heart sounds.  Pulmonary:     Effort: Pulmonary effort is normal. No respiratory distress.     Breath sounds: No wheezing.  Abdominal:     General: Bowel sounds are normal. There is no distension.     Palpations: Abdomen is soft.  Musculoskeletal:        General: No deformity. Normal range of motion.     Cervical back: Normal range of motion and neck supple.     Right lower leg: Edema present.     Left lower leg: Edema present.  Skin:    General: Skin is warm and dry.     Findings: No erythema or rash.  Neurological:     Mental Status: He is alert and oriented to person, place, and time. Mental status is at baseline.     Cranial Nerves: No cranial nerve deficit.     Coordination: Coordination normal.  Psychiatric:        Mood and Affect: Mood normal.    LABORATORY DATA:  I have reviewed the data as listed Lab  Results  Component Value Date   WBC 3.9 (L) 09/09/2021   HGB 10.3 (L) 09/09/2021   HCT 32.5 (L) 09/09/2021   MCV 90.5 09/09/2021   PLT 81 (L) 09/09/2021   Recent Labs    04/03/21 1200 07/19/21 0845 07/24/21 1031 09/09/21 1038  NA 136  --  131* 135  K 4.5  --  4.2 4.2  CL 102  --  100 102  CO2 24  --  24 28  GLUCOSE 184*  --  238* 168*  BUN 36*  --  30* 31*  CREATININE 1.29* 1.40* 1.26* 1.27*  CALCIUM 8.8*  --  8.6* 8.5*  GFRNONAA 59*  --  >60 60*  PROT 6.9  --  6.8 6.9  ALBUMIN 4.0  --  3.5 3.7  AST 57*  --  29 26  ALT 44  --  29 26  ALKPHOS 146*  --  203* 198*  BILITOT 1.1  --  1.2 1.5*    Iron/TIBC/Ferritin/ %Sat    Component Value Date/Time   IRON 54 09/09/2021 1038   IRON 112 07/11/2013 1019   TIBC 363 09/09/2021 1038   TIBC 336 07/11/2013 1019   FERRITIN 67 09/09/2021 1038   FERRITIN 39 07/11/2013 1019   IRONPCTSAT 15 (L) 09/09/2021 1038   IRONPCTSAT 33 07/11/2013 1019   IRONPCTSAT 35 04/17/2011 0922     CBC showed decreased hemoglobin at 10.3, platelet count 80,000.  Creatinine is 1.29 with a GFR of 59.  Slightly increased AST, normal ALT.  Elevated alkaline phosphatase.  Mildly decreased calcium.  Vitamin B12  544.  Ferritin 83, TIBC 342, iron saturation 21.  Reticulocyte panel showed reticulocyte hemoglobin 32.9.  Normal reticulocyte percentage and immature reticulocyte fraction, normal immature platelet fraction- inappropriate.  Negative SPEP, increased light chain ration, UPEP is negative. - he has no signs of gammopathy.  Peripheral blood showed CD5 positive, CD23 positive clonal B cells, less than 5000-consistent with monoclonal lymphocytosis.  RADIOGRAPHIC STUDIES: I have personally reviewed the radiological images as listed and agreed with the findings in the report. No results found.    ASSESSMENT & PLAN:  1. Other iron deficiency anemia   2. Thrombocytopenia ([place])   3. Fatty liver disease, nonalcoholic   4. Elevated alkaline phosphatase  level   5. Pneumonitis   6. Monoclonal B-cell lymphocytosis of undetermined significance    #Iron deficiency anemia, anemia secondary to chronic kidney disease. Iron panel showed decreased iron saturation of 15, ferritin 67. Recommend Venofer today.  Repeat another dose of Venofer in 2 weeks.  #Thrombocytopenia, reactive versus underlying bone marrow disorders.  Patient had extensive work-up done including bone marrow biopsy.  Which did not explain his thrombocytopenia.  Questionable secondary to underlying autoimmune disease.  Pending rheumatology work-up.  #Monoclonal lymphocytosis of unknown significance.  CT showed prominent/mildly enlarged mediastinal, hilar, retroperitoneal lymph nodes which are nonspecific.  Observation.  #Positive rheumatoid factor positive ESR,-encourage patient to follow-up with rheumatology. #Abnormal CT chest, follow-up with Dr. Lanney Gins.  # elevated alkline phosphatase. US abdomen showed fatty liver disease.   Follow-up in 4 months.  Orders Placed This Encounter  Procedures   CBC with Differential/Platelet    Standing Status:   Future    Standing Expiration Date:   09/11/2022   Iron and TIBC    Standing Status:   Future    Standing Expiration Date:   09/11/2022   Ferritin    Standing Status:  Future    Standing Expiration Date:   09/11/2022    All questions were answered. The patient knows to call the clinic with any problems questions or concerns.  cc [name], MD    Earlie Server, MD, PhD [facility] Hematology Oncology 09/10/2021

## 2021-09-10 NOTE — Patient Instructions (Signed)
[facility]  Discharge Instructions: ?Thank you for choosing [facility] to provide your oncology and hematology care.  ?If you have a lab appointment with the Cancer Center, please go directly to the Cancer Center and check in at the registration area. ? ?Wear comfortable clothing and clothing appropriate for easy access to any Portacath or PICC line.  ? ?We strive to give you quality time with your provider. You may need to reschedule your appointment if you arrive late (15 or more minutes).  Arriving late affects you and other patients whose appointments are after yours.  Also, if you miss three or more appointments without notifying the office, you may be dismissed from the clinic at the provider?s discretion.    ?  ?For prescription refill requests, have your pharmacy contact our office and allow 72 hours for refills to be completed.   ? ?Today you received the following chemotherapy and/or immunotherapy agents VENOFER ?    ?  ?To help prevent nausea and vomiting after your treatment, we encourage you to take your nausea medication as directed. ? ?BELOW ARE SYMPTOMS THAT SHOULD BE REPORTED IMMEDIATELY: ?*FEVER GREATER THAN 100.4 F (38 ?C) OR HIGHER ?*CHILLS OR SWEATING ?*NAUSEA AND VOMITING THAT IS NOT CONTROLLED WITH YOUR NAUSEA MEDICATION ?*UNUSUAL SHORTNESS OF BREATH ?*UNUSUAL BRUISING OR BLEEDING ?*URINARY PROBLEMS (pain or burning when urinating, or frequent urination) ?*BOWEL PROBLEMS (unusual diarrhea, constipation, pain near the anus) ?TENDERNESS IN MOUTH AND THROAT WITH OR WITHOUT PRESENCE OF ULCERS (sore throat, sores in mouth, or a toothache) ?UNUSUAL RASH, SWELLING OR PAIN  ?UNUSUAL VAGINAL DISCHARGE OR ITCHING  ? ?Items with * indicate a potential emergency and should be followed up as soon as possible or go to the Emergency Department if any problems should occur. ? ?Please show the CHEMOTHERAPY ALERT CARD or IMMUNOTHERAPY ALERT CARD at check-in to  the Emergency Department and triage nurse. ? ?Should you have questions after your visit or need to cancel or reschedule your appointment, please contact [facility]  [phone] and follow the prompts.  Office hours are 8:00 a.m. to 4:30 p.m. Monday - Friday. Please note that voicemails left after 4:00 p.m. may not be returned until the following business day.  We are closed weekends and major holidays. You have access to a nurse at all times for urgent questions. Please call the main number to the clinic [phone] and follow the prompts. ? ?For any non-urgent questions, you may also contact your provider using MyChart. We now offer e-Visits for anyone 18 and older to request care online for non-urgent symptoms. For details visit [email or web address]. ?  ?Also download the MyChart app! Go to the app store, search "MyChart", open the app, select [facility], and log in with your MyChart username and password. ? ?Due to Covid, a mask is required upon entering the hospital/clinic. If you do not have a mask, one will be given to you upon arrival. For doctor visits, patients may have 1 support person aged 18 or older with them. For treatment visits, patients cannot have anyone with them due to current Covid guidelines and our immunocompromised population.  ? ?Iron Sucrose Injection ?What is this medication? ?IRON SUCROSE (EYE ern SOO krose) treats low levels of iron (iron deficiency anemia) in people with kidney disease. Iron is a mineral that plays an important role in making red blood cells, which carry oxygen from your lungs to the rest of your body. ?This medicine   may be used for other purposes; ask your health care provider or pharmacist if you have questions. ?COMMON BRAND NAME(S): Venofer ?What should I tell my care team before I take this medication? ?They need to know if you have any of these conditions: ?Anemia not caused by low iron levels ?Heart disease ?High levels of  iron in the blood ?Kidney disease ?Liver disease ?An unusual or allergic reaction to iron, other medications, foods, dyes, or preservatives ?Pregnant or trying to get pregnant ?Breast-feeding ?How should I use this medication? ?This medication is for infusion into a vein. It is given in a hospital or clinic setting. ?Talk to your care team about the use of this medication in children. While this medication may be prescribed for children as young as 2 years for selected conditions, precautions do apply. ?Overdosage: If you think you have taken too much of this medicine contact a poison control center or emergency room at once. ?NOTE: This medicine is only for you. Do not share this medicine with others. ?What if I miss a dose? ?It is important not to miss your dose. Call your care team if you are unable to keep an appointment. ?What may interact with this medication? ?Do not take this medication with any of the following: ?Deferoxamine ?Dimercaprol ?Other iron products ?This medication may also interact with the following: ?Chloramphenicol ?Deferasirox ?This list may not describe all possible interactions. Give your health care provider a list of all the medicines, herbs, non-prescription drugs, or dietary supplements you use. Also tell them if you smoke, drink alcohol, or use illegal drugs. Some items may interact with your medicine. ?What should I watch for while using this medication? ?Visit your care team regularly. Tell your care team if your symptoms do not start to get better or if they get worse. You may need blood work done while you are taking this medication. ?You may need to follow a special diet. Talk to your care team. Foods that contain iron include: whole grains/cereals, dried fruits, beans, or peas, leafy green vegetables, and organ meats (liver, kidney). ?What side effects may I notice from receiving this medication? ?Side effects that you should report to your care team as soon as  possible: ?Allergic reactions--skin rash, itching, hives, swelling of the face, lips, tongue, or throat ?Low blood pressure--dizziness, feeling faint or lightheaded, blurry vision ?Shortness of breath ?Side effects that usually do not require medical attention (report to your care team if they continue or are bothersome): ?Flushing ?Headache ?Joint pain ?Muscle pain ?Nausea ?Pain, redness, or irritation at injection site ?This list may not describe all possible side effects. Call your doctor for medical advice about side effects. You may report side effects to FDA at 1-800-FDA-1088. ?Where should I keep my medication? ?This medication is given in a hospital or clinic and will not be stored at home. ?NOTE: This sheet is a summary. It may not cover all possible information. If you have questions about this medicine, talk to your doctor, pharmacist, or health care provider. ?? 2023 Elsevier/Gold Standard (2020-08-31 00:00:00) ? ?

## 2021-09-10 NOTE — Progress Notes (Signed)
Patient here for follow up. Pt reports that he has swelling from knees to abdomen and started taking prednisone and torsemide as of yesterday, per pulmonologist.

## 2021-09-23 DIAGNOSIS — I10 Essential (primary) hypertension: Secondary | ICD-10-CM | POA: Diagnosis not present

## 2021-09-23 DIAGNOSIS — R001 Bradycardia, unspecified: Secondary | ICD-10-CM | POA: Diagnosis not present

## 2021-09-23 DIAGNOSIS — I6523 Occlusion and stenosis of bilateral carotid arteries: Secondary | ICD-10-CM

## 2021-09-23 DIAGNOSIS — E119 Type 2 diabetes mellitus without complications: Secondary | ICD-10-CM | POA: Diagnosis not present

## 2021-09-23 DIAGNOSIS — R0602 Shortness of breath: Secondary | ICD-10-CM | POA: Diagnosis not present

## 2021-09-23 DIAGNOSIS — E782 Mixed hyperlipidemia: Secondary | ICD-10-CM | POA: Diagnosis not present

## 2021-09-23 HISTORY — DX: Occlusion and stenosis of bilateral carotid arteries: I65.23

## 2021-09-24 ENCOUNTER — Inpatient Hospital Stay: Payer: Medicare HMO | Attending: Oncology

## 2021-09-24 VITALS — BP 185/68 | HR 55 | Temp 97.9°F | Resp 18

## 2021-09-24 DIAGNOSIS — Z79899 Other long term (current) drug therapy: Secondary | ICD-10-CM | POA: Diagnosis not present

## 2021-09-24 DIAGNOSIS — D649 Anemia, unspecified: Secondary | ICD-10-CM | POA: Diagnosis not present

## 2021-09-24 DIAGNOSIS — D61818 Other pancytopenia: Secondary | ICD-10-CM | POA: Insufficient documentation

## 2021-09-24 DIAGNOSIS — D508 Other iron deficiency anemias: Secondary | ICD-10-CM

## 2021-09-24 MED ORDER — SODIUM CHLORIDE 0.9 % IV SOLN
Freq: Once | INTRAVENOUS | Status: AC
  Filled 2021-09-24: qty 250

## 2021-09-24 MED ORDER — IRON SUCROSE 20 MG/ML IV SOLN
200.0000 mg | Freq: Once | INTRAVENOUS | Status: AC
  Administered 2021-09-24: 200 mg via INTRAVENOUS
  Filled 2021-09-24: qty 10

## 2021-09-24 MED ORDER — SODIUM CHLORIDE 0.9 % IV SOLN: 200.0000 mg | Freq: Once | INTRAVENOUS | Status: DC

## 2021-09-24 NOTE — Patient Instructions (Signed)

## 2021-09-27 DIAGNOSIS — R768 Other specified abnormal immunological findings in serum: Secondary | ICD-10-CM | POA: Diagnosis not present

## 2021-09-27 DIAGNOSIS — R9389 Abnormal findings on diagnostic imaging of other specified body structures: Secondary | ICD-10-CM | POA: Diagnosis not present

## 2021-09-27 DIAGNOSIS — R590 Localized enlarged lymph nodes: Secondary | ICD-10-CM | POA: Diagnosis not present

## 2021-09-27 DIAGNOSIS — D696 Thrombocytopenia, unspecified: Secondary | ICD-10-CM | POA: Diagnosis not present

## 2021-09-30 DIAGNOSIS — Z961 Presence of intraocular lens: Secondary | ICD-10-CM | POA: Diagnosis not present

## 2021-09-30 DIAGNOSIS — H02831 Dermatochalasis of right upper eyelid: Secondary | ICD-10-CM | POA: Diagnosis not present

## 2021-09-30 DIAGNOSIS — E113513 Type 2 diabetes mellitus with proliferative diabetic retinopathy with macular edema, bilateral: Secondary | ICD-10-CM | POA: Diagnosis not present

## 2021-09-30 DIAGNOSIS — H02834 Dermatochalasis of left upper eyelid: Secondary | ICD-10-CM | POA: Diagnosis not present

## 2021-09-30 DIAGNOSIS — Z7984 Long term (current) use of oral hypoglycemic drugs: Secondary | ICD-10-CM | POA: Diagnosis not present

## 2021-10-04 ENCOUNTER — Other Ambulatory Visit: Payer: Self-pay

## 2021-10-15 DIAGNOSIS — E119 Type 2 diabetes mellitus without complications: Secondary | ICD-10-CM | POA: Diagnosis not present

## 2021-10-15 DIAGNOSIS — R001 Bradycardia, unspecified: Secondary | ICD-10-CM | POA: Diagnosis not present

## 2021-10-15 DIAGNOSIS — I6523 Occlusion and stenosis of bilateral carotid arteries: Secondary | ICD-10-CM | POA: Diagnosis not present

## 2021-10-15 DIAGNOSIS — R0602 Shortness of breath: Secondary | ICD-10-CM | POA: Diagnosis not present

## 2021-10-15 DIAGNOSIS — E785 Hyperlipidemia, unspecified: Secondary | ICD-10-CM | POA: Diagnosis not present

## 2021-10-15 DIAGNOSIS — I10 Essential (primary) hypertension: Secondary | ICD-10-CM | POA: Diagnosis not present

## 2021-10-28 ENCOUNTER — Other Ambulatory Visit: Payer: Medicare HMO

## 2021-10-28 ENCOUNTER — Ambulatory Visit: Payer: Medicare HMO | Admitting: Oncology

## 2021-10-28 DIAGNOSIS — I071 Rheumatic tricuspid insufficiency: Secondary | ICD-10-CM | POA: Diagnosis not present

## 2021-10-28 DIAGNOSIS — R001 Bradycardia, unspecified: Secondary | ICD-10-CM | POA: Diagnosis not present

## 2021-10-28 DIAGNOSIS — E782 Mixed hyperlipidemia: Secondary | ICD-10-CM | POA: Diagnosis not present

## 2021-10-28 DIAGNOSIS — I6523 Occlusion and stenosis of bilateral carotid arteries: Secondary | ICD-10-CM | POA: Diagnosis not present

## 2021-10-28 DIAGNOSIS — E119 Type 2 diabetes mellitus without complications: Secondary | ICD-10-CM | POA: Diagnosis not present

## 2021-10-28 DIAGNOSIS — I10 Essential (primary) hypertension: Secondary | ICD-10-CM | POA: Diagnosis not present

## 2021-11-19 ENCOUNTER — Encounter: Payer: Self-pay | Admitting: Emergency Medicine

## 2021-11-19 ENCOUNTER — Telehealth: Payer: Self-pay

## 2021-11-19 ENCOUNTER — Emergency Department
Admission: EM | Admit: 2021-11-19 | Discharge: 2021-11-19 | Disposition: A | Discharge status: Home / Self Care | Payer: Medicare HMO | Attending: Emergency Medicine | Admitting: Emergency Medicine

## 2021-11-19 ENCOUNTER — Other Ambulatory Visit: Payer: Self-pay

## 2021-11-19 ENCOUNTER — Emergency Department: Payer: Medicare HMO

## 2021-11-19 DIAGNOSIS — S199XXA Unspecified injury of neck, initial encounter: Secondary | ICD-10-CM | POA: Diagnosis not present

## 2021-11-19 DIAGNOSIS — R55 Syncope and collapse: Secondary | ICD-10-CM | POA: Insufficient documentation

## 2021-11-19 DIAGNOSIS — M25561 Pain in right knee: Secondary | ICD-10-CM | POA: Insufficient documentation

## 2021-11-19 DIAGNOSIS — M25551 Pain in right hip: Secondary | ICD-10-CM | POA: Insufficient documentation

## 2021-11-19 DIAGNOSIS — R519 Headache, unspecified: Secondary | ICD-10-CM | POA: Diagnosis not present

## 2021-11-19 DIAGNOSIS — M4802 Spinal stenosis, cervical region: Secondary | ICD-10-CM | POA: Diagnosis not present

## 2021-11-19 DIAGNOSIS — E1165 Type 2 diabetes mellitus with hyperglycemia: Secondary | ICD-10-CM | POA: Insufficient documentation

## 2021-11-19 DIAGNOSIS — W19XXXA Unspecified fall, initial encounter: Secondary | ICD-10-CM | POA: Insufficient documentation

## 2021-11-19 DIAGNOSIS — S0993XA Unspecified injury of face, initial encounter: Secondary | ICD-10-CM | POA: Diagnosis not present

## 2021-11-19 DIAGNOSIS — Y92009 Unspecified place in unspecified non-institutional (private) residence as the place of occurrence of the external cause: Secondary | ICD-10-CM | POA: Insufficient documentation

## 2021-11-19 DIAGNOSIS — M47812 Spondylosis without myelopathy or radiculopathy, cervical region: Secondary | ICD-10-CM | POA: Diagnosis not present

## 2021-11-19 DIAGNOSIS — R739 Hyperglycemia, unspecified: Secondary | ICD-10-CM

## 2021-11-19 DIAGNOSIS — D649 Anemia, unspecified: Secondary | ICD-10-CM | POA: Diagnosis not present

## 2021-11-19 DIAGNOSIS — E11649 Type 2 diabetes mellitus with hypoglycemia without coma: Secondary | ICD-10-CM | POA: Diagnosis not present

## 2021-11-19 LAB — CBC WITH DIFFERENTIAL/PLATELET
Abs Immature Granulocytes: 0.02 10*3/uL (ref 0.00–0.07)
Basophils Absolute: 0 10*3/uL (ref 0.0–0.1)
Basophils Relative: 1 %
Eosinophils Absolute: 0.1 10*3/uL (ref 0.0–0.5)
Eosinophils Relative: 2 %
HCT: 35.3 % — ABNORMAL LOW (ref 39.0–52.0)
Hemoglobin: 12.1 g/dL — ABNORMAL LOW (ref 13.0–17.0)
Immature Granulocytes: 0 %
Lymphocytes Relative: 15 %
Lymphs Abs: 0.8 10*3/uL (ref 0.7–4.0)
MCH: 28.9 pg (ref 26.0–34.0)
MCHC: 34.3 g/dL (ref 30.0–36.0)
MCV: 84.2 fL (ref 80.0–100.0)
Monocytes Absolute: 0.4 10*3/uL (ref 0.1–1.0)
Monocytes Relative: 8 %
Neutro Abs: 3.7 10*3/uL (ref 1.7–7.7)
Neutrophils Relative %: 74 %
Platelets: 100 10*3/uL — ABNORMAL LOW (ref 150–400)
RBC: 4.19 MIL/uL — ABNORMAL LOW (ref 4.22–5.81)
RDW: 16.3 % — ABNORMAL HIGH (ref 11.5–15.5)
WBC: 5 10*3/uL (ref 4.0–10.5)
nRBC: 0 % (ref 0.0–0.2)

## 2021-11-19 LAB — COMPREHENSIVE METABOLIC PANEL WITH GFR
ALT: 19 U/L (ref 0–44)
AST: 23 U/L (ref 15–41)
Albumin: 4.4 g/dL (ref 3.5–5.0)
Alkaline Phosphatase: 111 U/L (ref 38–126)
Anion gap: 9 (ref 5–15)
BUN: 27 mg/dL — ABNORMAL HIGH (ref 8–23)
CO2: 25 mmol/L (ref 22–32)
Calcium: 9.4 mg/dL (ref 8.9–10.3)
Chloride: 97 mmol/L — ABNORMAL LOW (ref 98–111)
Creatinine, Ser: 1.47 mg/dL — ABNORMAL HIGH (ref 0.61–1.24)
GFR, Estimated: 50 mL/min — ABNORMAL LOW
Glucose, Bld: 238 mg/dL — ABNORMAL HIGH (ref 70–99)
Potassium: 4.7 mmol/L (ref 3.5–5.1)
Sodium: 131 mmol/L — ABNORMAL LOW (ref 135–145)
Total Bilirubin: 1.2 mg/dL (ref 0.3–1.2)
Total Protein: 7.8 g/dL (ref 6.5–8.1)

## 2021-11-19 LAB — CBG MONITORING, ED: Glucose-Capillary: 238 mg/dL — ABNORMAL HIGH (ref 70–99)

## 2021-11-19 MED ORDER — SODIUM CHLORIDE 0.9 % IV BOLUS
1000.0000 mL | Freq: Once | INTRAVENOUS | Status: AC
  Administered 2021-11-19: 1000 mL via INTRAVENOUS

## 2021-11-19 NOTE — Telephone Encounter (Signed)
Glad to hear pt at ER.  Would also suggest as such.  May also need to be changed to hospital follow up appt with Dr. Damita Dunnings since he has went to ER rather than acute eval with me for 8/2.

## 2021-11-19 NOTE — ED Triage Notes (Signed)
Patient brought in by wife states yesterday when getting up to take his blood sugar due to being clammy and shaky he had a syncopal episode. Wife found pt on the floor and got him in bed and gave him orange juice and yogurt. C/o right knee pain after the fall and bruise to right hip. Also hit head on door jam. Was supposed to have root canal done today but sent here for further evaluation. Denies any blood thinners. Patient ambulates with cane.

## 2021-11-19 NOTE — Telephone Encounter (Signed)
Per chart review tab pt is currently at [facility] ED. Per appt notes pt already has appt scheduled for T Dugal FNP on 11/20/21 at 10:40. Sending note to T Dugal FNP,Dr Elliot Gurney as PCP and Claiborne Billings CMA.

## 2021-11-19 NOTE — Telephone Encounter (Signed)
Pt is still at [facility] ED and will FU with pt on 11/20/21 about FU appt with Dr Damita Dunnings on 11/21/21 at 3 PM with 30 min appt.

## 2021-11-19 NOTE — ED Notes (Signed)
Pt is A&O x 4. Pt fell yesterday after becoming shaky and clammy. Pt was given OJ by wife. Pt hurt hip and hit head during fall. Pt said that he has not been able to keep anything down today.

## 2021-11-19 NOTE — Telephone Encounter (Signed)
Noted.  Thanks.  Will await ER report.   

## 2021-11-19 NOTE — Telephone Encounter (Signed)
Please offer 3pm 61mn OV on 11/21/21.  Thanks.

## 2021-11-19 NOTE — ED Provider Triage Note (Signed)
Emergency Medicine Provider Triage Evaluation Note  DANYAL WHITENACK , a 71 y.o. male  was evaluated in triage.  Pt complains of right sided headache. Was clammy and shaking, causing him to fall.  Hit head and right side of face on tile floor.  No LOC but headache continues today.   Asprin a day.    Review of Systems  Positive: +nausea and vomiting once while in ED.   Negative: No vision changes.  Physical Exam  BP (!) 171/57 (BP Location: Left Arm)   Pulse 68   Temp 98.7 F (37.1 C) (Oral)   Resp 16   Ht '6\' 1"'$  (1.854 m)   Wt 127.9 kg   SpO2 99%   BMI 37.21 kg/m  Gen:   Awake, no distress.  Speaking normally and answers questions appropriately.   Resp:  Normal effort  MSK:   Moves extremities without difficulty  Other:  No obvious deformity noted to the face.  Skin is intact.  No bruising seen.  No tenderness on palpation of cervical spine posteriorly.  Medical Decision Making  Medically screening exam initiated at 2:21 PM.  Appropriate orders placed.  [name] was informed that the remainder of the evaluation will be completed by another provider, this initial triage assessment does not replace that evaluation, and the importance of remaining in the ED until their evaluation is complete.     Johnn Hai, PA-C 11/19/21 1431

## 2021-11-19 NOTE — ED Notes (Signed)
Pt's states he has to eat and "you are a medical professional, I have type 2 diabetes, don't you know what that mean". Pt was told again that this nurse had not found a provider. After finding a provider to ask about eating, Pt was taken peanut butter and crackers, and water. Pt was also told that his BG was 238 when taken, so it was not like he was going to bottom out.

## 2021-11-19 NOTE — Telephone Encounter (Signed)
[facility] - Client TELEPHONE ADVICE RECORD [facility] Patient Name: Dennis Wise T Gender: Male DOB: [birth year] Age: 71 Y 32 M 15 D Return Phone Number: [phone] (Primary), [phone] (Secondary) Address: City/ State/ Zip: [place] [address] Client [facility] - Client Client Site [place] - Day Provider Renford Dills - MD Contact Type Call Who Is Calling Patient / Member / Family / Caregiver Call Type Triage / Clinical Relationship To Patient Self Return Phone Number [phone] (Primary) Chief Complaint HEAD INJURY - and not acting right. Change in behaviour after hitting head. Reason for Call Symptomatic / Request for Health Information Initial Comment Caller states he had low blood sugar yesterday and he passed out. He twisted his knee and hit his head. Being transferred from the office due to no appts today. He has a headache currently. Translation No Nurse Assessment Nurse: Ysidro Evert, RN, Levada Dy Date/Time Eilene Ghazi Time): 11/19/2021 12:22:10 PM Confirm and document reason for call. If symptomatic, describe symptoms. ---Caller states he passed out yesterday and he hit his head and has some bruising and swelling Does the patient have any new or worsening symptoms? ---Yes Will a triage be completed? ---Yes Related visit to physician within the last 2 weeks? ---No Does the PT have any chronic conditions? (i.e. diabetes, asthma, this includes High risk factors for pregnancy, etc.) ---Yes List chronic conditions. ---diabetes Is this a behavioral health or substance abuse call? ---No Guidelines Guideline Title Affirmed Question Affirmed Notes Nurse Date/Time (Eastern Time) Fainting [1] Fainted > 15 minutes ago AND [2] still feels weak or dizzy Ysidro Evert, RN, Levada Dy 11/19/2021 12:23:32 PM Disp. Time Eilene Ghazi Time) Disposition Final User 11/19/2021 12:19:45 PM Send to Urgent [facility] 11/19/2021 12:26:46 PM Go to ED Now Yes Ysidro Evert, RN, Levada Dy PLEASE NOTE: All timestamps contained within this report are represented as [place] Standard Time. CONFIDENTIALTY NOTICE: This fax transmission is intended only for the addressee. It contains information that is legally privileged, confidential or otherwise protected from use or disclosure. If you are not the intended recipient, you are strictly prohibited from reviewing, disclosing, copying using or disseminating any of this information or taking any action in reliance on or regarding this information. If you have received this fax in error, please notify us immediately by telephone so that we can arrange for its return to [place]. Phone: [phone], Toll-Free: [phone], Fax: [phone] Page: 2 of 2 Call Id: [id number] Final Disposition 11/19/2021 12:26:46 PM Go to ED Now Yes Ysidro Evert, RN, Marin Shutter Disagree/Comply Comply Caller Understands Yes PreDisposition Did not know what to do Care Advice Given Per Guideline GO TO ED NOW: * You need to be seen in the Emergency Department. * Go to the ED at ___________ [place] now. Drive carefully. * Another adult should drive. * Patient should not delay going to the emergency department. CALL EMS 911 IF: * You become worse * Too dizzy or weak to stand CARE ADVICE given per Fainting (Adult) guideline. Referrals [place]

## 2021-11-20 ENCOUNTER — Ambulatory Visit: Payer: Medicare HMO | Admitting: Family

## 2021-11-20 ENCOUNTER — Other Ambulatory Visit: Payer: Self-pay

## 2021-11-20 ENCOUNTER — Other Ambulatory Visit: Payer: Self-pay | Admitting: Family

## 2021-11-20 DIAGNOSIS — E113299 Type 2 diabetes mellitus with mild nonproliferative diabetic retinopathy without macular edema, unspecified eye: Secondary | ICD-10-CM

## 2021-11-20 MED ORDER — BLOOD GLUCOSE MONITOR KIT: PACK | 0 refills | Status: DC

## 2021-11-20 NOTE — Telephone Encounter (Addendum)
I spoke with pt who feels OK today but slightly nauseated but has not eaten today; pt is about to eat some toast. Pt said at ED on 11/19/21 BS was 238. Pt said taking glimepiride 2 mg daily in evening, metformin 850 mg bid and actos 45 mg at night. Pt took meds last night as usual. Pt does not know what BS is because meter is broken. Pt uses accuchek nano smartview to [facility]. Dr Damita Dunnings is out of office and T Dugal FNP said she will send in order for new meter and pt will pick up new meter today. UC & ED precautions given and pt voiced understanding. Appt with Red Christians FNP cancelled for 11/20/21 and pt scheduled ED FU with Dr Damita Dunnings on 11/21/21 at 3 PM. Sending note to Dr Damita Dunnings, Red Christians FNP and Janett Billow CMA.

## 2021-11-20 NOTE — Telephone Encounter (Signed)
Noted. Thanks.

## 2021-11-20 NOTE — Telephone Encounter (Signed)
Agree with precautions given to pt  Agree with nurse assessment in plan.  Thank you for speaking with them. 

## 2021-11-21 ENCOUNTER — Inpatient Hospital Stay: Payer: Medicare HMO | Admitting: Family Medicine

## 2021-11-21 ENCOUNTER — Ambulatory Visit (INDEPENDENT_AMBULATORY_CARE_PROVIDER_SITE_OTHER): Payer: Medicare HMO | Admitting: Family Medicine

## 2021-11-21 ENCOUNTER — Encounter: Payer: Self-pay | Admitting: Family Medicine

## 2021-11-21 VITALS — BP 130/70 | HR 57 | Temp 98.0°F | Ht 73.0 in | Wt 240.0 lb

## 2021-11-21 DIAGNOSIS — E113299 Type 2 diabetes mellitus with mild nonproliferative diabetic retinopathy without macular edema, unspecified eye: Secondary | ICD-10-CM | POA: Diagnosis not present

## 2021-11-21 DIAGNOSIS — R55 Syncope and collapse: Secondary | ICD-10-CM

## 2021-11-21 MED ORDER — RAMIPRIL 5 MG PO CAPS: 5.0000 mg | ORAL_CAPSULE | Freq: Every day | ORAL | Status: DC

## 2021-11-21 MED ORDER — ONDANSETRON HCL 4 MG PO TABS: 4.0000 mg | ORAL_TABLET | Freq: Three times a day (TID) | ORAL | 0 refills | Status: DC | PRN

## 2021-11-21 NOTE — Patient Instructions (Addendum)
Stop ramipril for now.  Keep drinking fluids, enough to keep your urine clear.  Go to the lab on the way out.   If you have mychart we'll likely use that to update you.    Stop glimepiride for now.   Zofran for nausea.   Let me know about your BP and sugars next week.

## 2021-11-21 NOTE — Progress Notes (Signed)
He had dental infection and is on amoxil.  Had used tylenol with codeine for pain.  He had root canal 2 days ago.  That was the day of episode/sx.  Possible low sugar, d/w pt.  He doesn't remember falling/passing out.  He was checking his sugar, had anticipated symptoms.  He got an error message on his meter.  He turned and fell.  Bumped his head but he doesn't remember the mechanism.  Wife found him.  He came to and wife didn't see SZ activity.  He was able to get up.  Then he had chills.    He hasn't had much solid food intake over the last few days.  He has vomited multiple times over the last few days.  No abd pain.  No diarrhea.  Wife isn't sick with GI illness.    He can feel lightheaded on standing but that is better in the last few days.    He has a scrape on the R anterior scalp.    H/o intentional weight loss.  Still on ramipril.  Off torsemide and flomax.    Meds, vitals, and allergies reviewed.   ROS: Per HPI unless specifically indicated in ROS section   GEN: nad, alert and oriented, he has a scrape noted on the right side of the scalp but it does not appear infected. HEENT: mucous membranes moist NECK: supple w/o LA CV: rrr. PULM: ctab, no inc wob ABD: soft, +bs EXT: no edema SKIN: no acute rash

## 2021-11-22 ENCOUNTER — Telehealth: Payer: Self-pay

## 2021-11-22 DIAGNOSIS — R55 Syncope and collapse: Secondary | ICD-10-CM | POA: Insufficient documentation

## 2021-11-22 HISTORY — DX: Syncope and collapse: R55

## 2021-11-22 LAB — BASIC METABOLIC PANEL
BUN: 17 mg/dL (ref 6–23)
CO2: 27 mEq/L (ref 19–32)
Calcium: 9 mg/dL (ref 8.4–10.5)
Chloride: 92 mEq/L — ABNORMAL LOW (ref 96–112)
Creatinine, Ser: 1.21 mg/dL (ref 0.40–1.50)
GFR: 59.45 mL/min — ABNORMAL LOW (ref >60.00)
Glucose, Bld: 212 mg/dL — ABNORMAL HIGH (ref 70–99)
Potassium: 4.2 mEq/L (ref 3.5–5.1)
Sodium: 127 mEq/L — ABNORMAL LOW (ref 135–145)

## 2021-11-22 LAB — HEMOGLOBIN A1C: Hgb A1c MFr Bld: 9.2 % — ABNORMAL HIGH (ref 4.6–6.5)

## 2021-11-22 NOTE — Assessment & Plan Note (Signed)
Of unclear source but could be multifactorial.  He has been relatively lightheaded.  He could have relative hypotension contributing.  He could have had an episode with hypoglycemia.  He has had a dental infection and has been on antibiotics, all of that could contribute to his situation.  Discussed.  EKG with bradycardia at baseline but no new changes noted.  Discussed with patient.  Previous imaging noted.  No new stroke.  No typical seizure activity or postictal state.  At this point, would stop ramipril for now.  Keep drinking fluids, enough to keep urine clear.  See notes on labs. Stop glimepiride for now.   Zofran for nausea.   I asked him to let me know about his blood pressure and sugars next week.  Appears okay for outpatient follow-up.  Patient agrees to plan.  31 minutes were devoted to patient care in this encounter (this includes time spent reviewing the patient's file/history, interviewing and examining the patient, counseling/reviewing plan with patient).

## 2021-11-22 NOTE — Progress Notes (Signed)
    Chronic Care Management Pharmacy Assistant   Name: Dennis Wise  MRN: [id number] DOB: 1950/10/18  Reason for Encounter: CCM (Hosptial Follow Up)  Medications: Outpatient Encounter Medications as of 11/22/2021  Medication Sig   ACCU-CHEK FASTCLIX LANCETS MISC Use to test blood sugar once daily or as needed.  Diagnosis:  E11.3299  Non insulin dependent.   aspirin 81 MG tablet Take 81 mg by mouth daily.   atorvastatin (LIPITOR) 10 MG tablet Take 1 tablet (10 mg total) by mouth at bedtime.   blood glucose meter kit and supplies KIT Dispense based on patient and insurance preference. Use up to four times daily as directed. (FOR ICD-9 250.00, 250.01).   Blood Glucose Monitoring Suppl (ACCU-CHEK NANO SMARTVIEW) w/Device KIT Use to check blood sugar once daily or as needed.  Diagnosis: E11.3299  Non insulin dependent.   glucose blood (ACCU-CHEK SMARTVIEW) test strip Use as instructed to test blood sugar once daily or as needed.  Diagnosis:  E11.3299  Non insulin dependent.   metFORMIN (GLUCOPHAGE) 850 MG tablet Take 1 tablet (850 mg total) by mouth 2 (two) times daily.   Multiple Vitamin (MULTIVITAMIN) tablet Take 1 tablet by mouth daily. Centrum Silver   niacin (NIASPAN) 500 MG CR tablet Take 3 tablets (1,500 mg total) by mouth daily.   ondansetron (ZOFRAN) 4 MG tablet Take 1 tablet (4 mg total) by mouth every 8 (eight) hours as needed for nausea or vomiting.   pioglitazone (ACTOS) 45 MG tablet Take 1 tablet (45 mg total) by mouth daily.   ramipril (ALTACE) 5 MG capsule Take 1 capsule (5 mg total) by mouth daily.   No facility-administered encounter medications on file as of 11/22/2021.   Reviewed hospital notes for details of recent visit. Patient has been contacted by Transitions of Care team: No  Admitted to the ED on 11/19/2021. Discharge date was 11/19/2021.  Discharged from [facility].   Discharge diagnosis (Principal Problem): Hyperglycemia and Fall in home Patient was  discharged to Home  Brief summary of hospital course: Pt complains of right sided headache. Was clammy and shaking, causing him to fall.  Hit head and right side of face on tile floor.  No LOC but headache continues today.   Asprin a day.   CT CERVICAL SPINE WITHOUT CONTRAST Alignment: There is minimal kyphotic angulation centered at C4-5. No sagittal spondylolisthesis. 1. No acute fracture or static subluxation. 2. Multilevel degenerative disc and joint changes as above CT MAXILLOFACIAL WITHOUT CONTRAST No traumatic finding.  No facial fracture. CT HEAD WITHOUT CONTRAST No acute findings. The patient appears reasonably screened and/or stabilized for discharge and I doubt any other medical condition or other [facility] requiring further screening, evaluation, or treatment in the ED exists or is present at this time prior to discharge.   Medications that remain the same after Hospital Discharge:??  -All other medications will remain the same.    Next CCM appt: Patient is CCS  Other upcoming appts: Cardiology appointment on 01/15/2022  Charlene Brooke, PharmD notified and will determine if action is needed.

## 2021-11-26 NOTE — ED Provider Notes (Signed)
[facility] Provider Note   Event Date/Time   First MD Initiated Contact with Patient 11/19/21 1748     (approximate) History  Fall and Hyperglycemia  HPI Dennis Wise is a 71 y.o. male with a past medical history of type 2 diabetes who presents after a fall.  Greater wife patient's had issues with his blood sugar recently and has become clammy and shaky on occasion with an accompanying syncopal episode.  Patient states that his wife found him on the floor today got him into bed and give him orange juice and yogurt which improve the symptoms.  Patient did not have his glucose taken at that time.  Patient now complains of right knee pain, right hip pain, and a head injury that was sustained on a door jam.  Patient did just have a root canal done today as well and was encouraged by his dentist to come to the emergency department for evaluation.  Patient denies any subsequent loss of consciousness or hypoglycemia ROS: Patient currently denies any vision changes, tinnitus, difficulty speaking, facial droop, sore throat, chest pain, shortness of breath, abdominal pain, nausea/vomiting/diarrhea, dysuria, or weakness/numbness/paresthesias in any extremity   Physical Exam  Triage Vital Signs: ED Triage Vitals  Enc Vitals Group     BP 11/19/21 1411 (!) 171/57     Pulse Rate 11/19/21 1411 68     Resp 11/19/21 1411 16     Temp 11/19/21 1411 98.7 F (37.1 C)     Temp Source 11/19/21 1411 Oral     SpO2 11/19/21 1411 99 %     Weight 11/19/21 1421 282 lb (127.9 kg)     Height 11/19/21 1421 '6\' 1"'$  (1.854 m)     Head Circumference --      Peak Flow --      Pain Score 11/19/21 1420 7     Pain Loc --      Pain Edu? --      Excl. in [place]? --    Most recent vital signs: Vitals:   11/19/21 2030 11/19/21 2100  BP: (!) 172/56 (!) 196/64  Pulse: 66 65  Resp: 11 17  Temp:  98.9 F (37.2 C)  SpO2: 98% 100%   General: Awake, oriented x4. CV:  Good peripheral perfusion.   Resp:  Normal effort.  Abd:  No distention.  Other:  Elderly overweight Caucasian male laying in bed in no acute distress.  Mild tenderness to palpation over the right knee and lateral hip with full range of motion with minimal pain.  Patient is ambulatory without difficulty. ED Results / Procedures / Treatments  Labs (all labs ordered are listed, but only abnormal results are displayed) Labs Reviewed  CBC WITH DIFFERENTIAL/PLATELET - Abnormal; Notable for the following components:      Result Value   RBC 4.19 (*)    Hemoglobin 12.1 (*)    HCT 35.3 (*)    RDW 16.3 (*)    Platelets 100 (*)    All other components within normal limits  COMPREHENSIVE METABOLIC PANEL - Abnormal; Notable for the following components:   Sodium 131 (*)    Chloride 97 (*)    Glucose, Bld 238 (*)    BUN 27 (*)    Creatinine, Ser 1.47 (*)    GFR, Estimated 50 (*)    All other components within normal limits  CBG MONITORING, ED - Abnormal; Notable for the following components:   Glucose-Capillary 238 (*)    All  other components within normal limits   RADIOLOGY ED MD interpretation: CT of the head without contrast interpreted by me shows no evidence of acute abnormalities including no intracerebral hemorrhage, obvious masses, or significant edema  CT of the cervical spine interpreted by me does not show any evidence of acute abnormalities including no acute fracture, malalignment, height loss, or dislocation  CT of the maxillofacial structures without contrast interpreted by me and shows no evidence of acute fracture, malalignment, height loss, or dislocation -Agree with radiology assessment Official radiology report(s): No results found. PROCEDURES: Critical Care performed: No Procedures MEDICATIONS ORDERED IN ED: Medications  sodium chloride 0.9 % bolus 1,000 mL (0 mLs Intravenous Stopped 11/19/21 2105)   IMPRESSION / MDM / ASSESSMENT AND PLAN / ED COURSE  I reviewed the triage vital signs and the  nursing notes.                             The patient is on the cardiac monitor to evaluate for evidence of arrhythmia and/or significant heart rate changes. Patient's presentation is most consistent with acute presentation with potential threat to life or bodily function. Presenting after a fall that occurred just prior to arrival, resulting in injury to the face. The mechanism of injury was a mechanical ground level fall without syncope or near-syncope. The current level of pain is moderate. There was no loss of consciousness, confusion, seizure, or memory impairment. There is not a laceration associated with the injury. Denies neck pain. The patient does not take blood thinner medications. Denies vomiting, numbness/weakness, fever  Dispo: Discharge with PCP follow-up       FINAL CLINICAL IMPRESSION(S) / ED DIAGNOSES   Final diagnoses:  Hyperglycemia  Fall in home, initial encounter   Rx / DC Orders   ED Discharge Orders     None      Note:  This document was prepared using Dragon voice recognition software and may include unintentional dictation errors.   Naaman Plummer, MD 11/26/21 [phone]

## 2021-11-27 ENCOUNTER — Telehealth: Payer: Self-pay

## 2021-11-27 NOTE — Progress Notes (Signed)
    Chronic Care Management Pharmacy Assistant   Reason for Encounter: CCM (Eye Exam)   Per chart review patient appears to be overdue for diabetic exam. Called patient to offer an eye exam at [facility]. No answer; left message.  Charlene Brooke, CPP notified  Marijean Niemann, [place] Clinical Pharmacy Assistant [phone]

## 2021-11-29 ENCOUNTER — Encounter: Payer: Self-pay | Admitting: Family Medicine

## 2021-12-01 NOTE — Telephone Encounter (Signed)
Please see result note about getting patient in clinic this week.  Thanks.

## 2021-12-02 NOTE — Telephone Encounter (Signed)
Patient is coming in office tomorrow at 9:30am

## 2021-12-03 ENCOUNTER — Encounter: Payer: Self-pay | Admitting: Family Medicine

## 2021-12-03 ENCOUNTER — Ambulatory Visit (INDEPENDENT_AMBULATORY_CARE_PROVIDER_SITE_OTHER): Payer: Medicare HMO | Admitting: Family Medicine

## 2021-12-03 VITALS — BP 130/50 | HR 57 | Temp 97.6°F | Ht 73.0 in | Wt 236.0 lb

## 2021-12-03 DIAGNOSIS — I10 Essential (primary) hypertension: Secondary | ICD-10-CM

## 2021-12-03 DIAGNOSIS — E113299 Type 2 diabetes mellitus with mild nonproliferative diabetic retinopathy without macular edema, unspecified eye: Secondary | ICD-10-CM

## 2021-12-03 LAB — BASIC METABOLIC PANEL
BUN: 26 mg/dL — ABNORMAL HIGH (ref 6–23)
CO2: 26 mEq/L (ref 19–32)
Calcium: 9.2 mg/dL (ref 8.4–10.5)
Chloride: 97 mEq/L (ref 96–112)
Creatinine, Ser: 1.37 mg/dL (ref 0.40–1.50)
GFR: 51.21 mL/min — ABNORMAL LOW (ref >60.00)
Glucose, Bld: 158 mg/dL — ABNORMAL HIGH (ref 70–99)
Potassium: 4.7 mEq/L (ref 3.5–5.1)
Sodium: 133 mEq/L — ABNORMAL LOW (ref 135–145)

## 2021-12-03 NOTE — Patient Instructions (Addendum)
Don't change your meds for now.  I would stay off ramipril for now.  Go to the lab on the way out.   If you have mychart we'll likely use that to update you.    Take care.  Glad to see you.

## 2021-12-03 NOTE — Progress Notes (Unsigned)
F/u for BP, low Na, and sugar.    He is drinking about 1/2 gallon of water a day recently.  He prev had sugar elevation on prednisone.  His sugars have improved in the meantime.  BP at home has been 130-140/50-60 with pulse 50-60s.  He had weight loss with diet changes.  He felt well enough to work in the yard yesterday in the heat.  He wasn't lightheaded.  He is off ramipril in the meantime.    He is back on glimepiride in the meantime.  Sugar improved in the meantime with restart of med.  110-160s recently, usually on the lower end of that range.    He is going to [employer] to cover the [place] [employer] tournament.  He feels well enough to do that.  He feels better now than he did 1 month ago or even 2 weeks ago.    Resolving scabbing on the R forehead/scalp.    Meds, vitals, and allergies reviewed.   ROS: Per HPI unless specifically indicated in ROS section   GEN: nad, alert and oriented, resolving scabbing noted on the right side of the scalp. HEENT: mmm NECK: supple w/o LA CV: rrr.  PULM: ctab, no inc wob ABD: soft, +bs EXT: no edema SKIN: Well-perfused.  30 minutes were devoted to patient care in this encounter (this includes time spent reviewing the patient's file/history, interviewing and examining the patient, counseling/reviewing plan with patient).

## 2021-12-04 NOTE — Assessment & Plan Note (Signed)
Blood pressure is reasonable off of ramipril.  History of low sodium noted.  Recheck sodium pending for today.  See notes on labs.  I would stay off of ramipril in the meantime.  Routine cautions given to patient.

## 2021-12-04 NOTE — Assessment & Plan Note (Signed)
Would continue glimepiride as is since his sugar has been reasonable.  See notes on follow-up labs.

## 2021-12-06 DIAGNOSIS — R0600 Dyspnea, unspecified: Secondary | ICD-10-CM | POA: Diagnosis not present

## 2021-12-06 LAB — FRUCTOSAMINE: Fructosamine: 439 umol/L — ABNORMAL HIGH (ref 205–285)

## 2022-01-01 DIAGNOSIS — Z85828 Personal history of other malignant neoplasm of skin: Secondary | ICD-10-CM | POA: Diagnosis not present

## 2022-01-01 DIAGNOSIS — D2272 Melanocytic nevi of left lower limb, including hip: Secondary | ICD-10-CM | POA: Diagnosis not present

## 2022-01-01 DIAGNOSIS — X32XXXA Exposure to sunlight, initial encounter: Secondary | ICD-10-CM | POA: Diagnosis not present

## 2022-01-01 DIAGNOSIS — D2262 Melanocytic nevi of left upper limb, including shoulder: Secondary | ICD-10-CM | POA: Diagnosis not present

## 2022-01-01 DIAGNOSIS — D0422 Carcinoma in situ of skin of left ear and external auricular canal: Secondary | ICD-10-CM | POA: Diagnosis not present

## 2022-01-01 DIAGNOSIS — L570 Actinic keratosis: Secondary | ICD-10-CM | POA: Diagnosis not present

## 2022-01-01 DIAGNOSIS — D485 Neoplasm of uncertain behavior of skin: Secondary | ICD-10-CM | POA: Diagnosis not present

## 2022-01-01 DIAGNOSIS — D2261 Melanocytic nevi of right upper limb, including shoulder: Secondary | ICD-10-CM | POA: Diagnosis not present

## 2022-01-06 DIAGNOSIS — Z961 Presence of intraocular lens: Secondary | ICD-10-CM | POA: Diagnosis not present

## 2022-01-06 DIAGNOSIS — E113513 Type 2 diabetes mellitus with proliferative diabetic retinopathy with macular edema, bilateral: Secondary | ICD-10-CM | POA: Diagnosis not present

## 2022-01-06 DIAGNOSIS — H02831 Dermatochalasis of right upper eyelid: Secondary | ICD-10-CM | POA: Diagnosis not present

## 2022-01-06 DIAGNOSIS — H02834 Dermatochalasis of left upper eyelid: Secondary | ICD-10-CM | POA: Diagnosis not present

## 2022-01-06 DIAGNOSIS — H40003 Preglaucoma, unspecified, bilateral: Secondary | ICD-10-CM | POA: Diagnosis not present

## 2022-01-06 DIAGNOSIS — Z7984 Long term (current) use of oral hypoglycemic drugs: Secondary | ICD-10-CM | POA: Diagnosis not present

## 2022-01-13 ENCOUNTER — Inpatient Hospital Stay: Payer: Medicare HMO | Attending: Oncology

## 2022-01-13 DIAGNOSIS — R748 Abnormal levels of other serum enzymes: Secondary | ICD-10-CM | POA: Diagnosis not present

## 2022-01-13 DIAGNOSIS — R161 Splenomegaly, not elsewhere classified: Secondary | ICD-10-CM | POA: Insufficient documentation

## 2022-01-13 DIAGNOSIS — R590 Localized enlarged lymph nodes: Secondary | ICD-10-CM | POA: Diagnosis not present

## 2022-01-13 DIAGNOSIS — Z809 Family history of malignant neoplasm, unspecified: Secondary | ICD-10-CM | POA: Insufficient documentation

## 2022-01-13 DIAGNOSIS — Z8249 Family history of ischemic heart disease and other diseases of the circulatory system: Secondary | ICD-10-CM | POA: Insufficient documentation

## 2022-01-13 DIAGNOSIS — Z818 Family history of other mental and behavioral disorders: Secondary | ICD-10-CM | POA: Insufficient documentation

## 2022-01-13 DIAGNOSIS — R634 Abnormal weight loss: Secondary | ICD-10-CM | POA: Diagnosis not present

## 2022-01-13 DIAGNOSIS — E119 Type 2 diabetes mellitus without complications: Secondary | ICD-10-CM | POA: Insufficient documentation

## 2022-01-13 DIAGNOSIS — D509 Iron deficiency anemia, unspecified: Secondary | ICD-10-CM | POA: Diagnosis not present

## 2022-01-13 DIAGNOSIS — Z836 Family history of other diseases of the respiratory system: Secondary | ICD-10-CM | POA: Diagnosis not present

## 2022-01-13 DIAGNOSIS — Z79899 Other long term (current) drug therapy: Secondary | ICD-10-CM | POA: Diagnosis not present

## 2022-01-13 DIAGNOSIS — Z833 Family history of diabetes mellitus: Secondary | ICD-10-CM | POA: Diagnosis not present

## 2022-01-13 DIAGNOSIS — Z8719 Personal history of other diseases of the digestive system: Secondary | ICD-10-CM | POA: Insufficient documentation

## 2022-01-13 DIAGNOSIS — D72820 Lymphocytosis (symptomatic): Secondary | ICD-10-CM | POA: Insufficient documentation

## 2022-01-13 DIAGNOSIS — D508 Other iron deficiency anemias: Secondary | ICD-10-CM

## 2022-01-13 DIAGNOSIS — Z808 Family history of malignant neoplasm of other organs or systems: Secondary | ICD-10-CM | POA: Insufficient documentation

## 2022-01-13 DIAGNOSIS — I10 Essential (primary) hypertension: Secondary | ICD-10-CM | POA: Diagnosis not present

## 2022-01-13 DIAGNOSIS — D61818 Other pancytopenia: Secondary | ICD-10-CM | POA: Diagnosis not present

## 2022-01-13 LAB — CBC WITH DIFFERENTIAL/PLATELET
Abs Immature Granulocytes: 0.02 10*3/uL (ref 0.00–0.07)
Basophils Absolute: 0.1 10*3/uL (ref 0.0–0.1)
Basophils Relative: 1 %
Eosinophils Absolute: 0.3 10*3/uL (ref 0.0–0.5)
Eosinophils Relative: 5 %
HCT: 27.3 % — ABNORMAL LOW (ref 39.0–52.0)
Hemoglobin: 9 g/dL — ABNORMAL LOW (ref 13.0–17.0)
Immature Granulocytes: 0 %
Lymphocytes Relative: 16 %
Lymphs Abs: 1 10*3/uL (ref 0.7–4.0)
MCH: 30.6 pg (ref 26.0–34.0)
MCHC: 33 g/dL (ref 30.0–36.0)
MCV: 92.9 fL (ref 80.0–100.0)
Monocytes Absolute: 0.6 10*3/uL (ref 0.1–1.0)
Monocytes Relative: 9 %
Neutro Abs: 4.2 10*3/uL (ref 1.7–7.7)
Neutrophils Relative %: 69 %
Platelets: 120 10*3/uL — ABNORMAL LOW (ref 150–400)
RBC: 2.94 MIL/uL — ABNORMAL LOW (ref 4.22–5.81)
RDW: 15.2 % (ref 11.5–15.5)
WBC: 6 10*3/uL (ref 4.0–10.5)
nRBC: 0 % (ref 0.0–0.2)

## 2022-01-13 LAB — IRON AND TIBC
Iron: 34 ug/dL — ABNORMAL LOW (ref 45–182)
Saturation Ratios: 13 % — ABNORMAL LOW (ref 17.9–39.5)
TIBC: 273 ug/dL (ref 250–450)
UIBC: 239 ug/dL

## 2022-01-13 LAB — FERRITIN: Ferritin: 127 ng/mL (ref 24–336)

## 2022-01-15 ENCOUNTER — Inpatient Hospital Stay: Payer: Medicare HMO | Admitting: Oncology

## 2022-01-15 ENCOUNTER — Encounter: Payer: Self-pay | Admitting: Oncology

## 2022-01-15 ENCOUNTER — Inpatient Hospital Stay: Payer: Medicare HMO

## 2022-01-15 VITALS — BP 151/61 | HR 50

## 2022-01-15 VITALS — BP 148/53 | HR 54 | Resp 18 | Ht 73.0 in | Wt 250.0 lb

## 2022-01-15 DIAGNOSIS — D72820 Lymphocytosis (symptomatic): Secondary | ICD-10-CM | POA: Diagnosis not present

## 2022-01-15 DIAGNOSIS — R634 Abnormal weight loss: Secondary | ICD-10-CM | POA: Diagnosis not present

## 2022-01-15 DIAGNOSIS — D509 Iron deficiency anemia, unspecified: Secondary | ICD-10-CM | POA: Diagnosis not present

## 2022-01-15 DIAGNOSIS — I10 Essential (primary) hypertension: Secondary | ICD-10-CM | POA: Diagnosis not present

## 2022-01-15 DIAGNOSIS — K760 Fatty (change of) liver, not elsewhere classified: Secondary | ICD-10-CM | POA: Diagnosis not present

## 2022-01-15 DIAGNOSIS — E119 Type 2 diabetes mellitus without complications: Secondary | ICD-10-CM | POA: Diagnosis not present

## 2022-01-15 DIAGNOSIS — D61818 Other pancytopenia: Secondary | ICD-10-CM | POA: Diagnosis not present

## 2022-01-15 DIAGNOSIS — D508 Other iron deficiency anemias: Secondary | ICD-10-CM

## 2022-01-15 DIAGNOSIS — D696 Thrombocytopenia, unspecified: Secondary | ICD-10-CM | POA: Diagnosis not present

## 2022-01-15 DIAGNOSIS — R748 Abnormal levels of other serum enzymes: Secondary | ICD-10-CM | POA: Diagnosis not present

## 2022-01-15 DIAGNOSIS — R161 Splenomegaly, not elsewhere classified: Secondary | ICD-10-CM | POA: Diagnosis not present

## 2022-01-15 DIAGNOSIS — R590 Localized enlarged lymph nodes: Secondary | ICD-10-CM | POA: Diagnosis not present

## 2022-01-15 HISTORY — DX: Lymphocytosis (symptomatic): D72.820

## 2022-01-15 MED ORDER — SODIUM CHLORIDE 0.9 % IV SOLN
200.0000 mg | Freq: Once | INTRAVENOUS | Status: AC
  Administered 2022-01-15: 200 mg via INTRAVENOUS
  Filled 2022-01-15: qty 200

## 2022-01-15 MED ORDER — SODIUM CHLORIDE 0.9 % IV SOLN
Freq: Once | INTRAVENOUS | Status: AC
  Filled 2022-01-15: qty 250

## 2022-01-15 NOTE — Assessment & Plan Note (Signed)
Stable continue monitor

## 2022-01-15 NOTE — Progress Notes (Signed)
Hematology/Oncology Progress note [phone] [phone]         Patient Care Team: [name], MD as PCP - General (Family Medicine) Gearlean Alf, MD as Referring Physician (Ophthalmology) Nicholas Lose, MD as Consulting Physician (Hematology and Oncology) [name], [facility] as Pharmacist (Pharmacist)  ASSESSMENT & PLAN:   IDA (iron deficiency anemia) Iron panel showed decreased iron saturation of 13, ferritin 127 Recommend Venofer weekly x 4  Monoclonal B-cell lymphocytosis Recommend observation CT showed prominent/mildly enlarged mediastinal, hilar, retroperitoneal lymph nodes which are nonspecific.    Thrombocytopenia ([place]) Stable continue monitor  Weight loss Splenomegaly and Prominent/mildly enlarged mediastinal, hilar and retroperitoneal lymph nodes Recommend repeat CT chest abdomen pelvis w contrast  Orders Placed This Encounter  Procedures   CT CHEST ABDOMEN PELVIS W CONTRAST    Standing Status:   Future    Standing Expiration Date:   01/16/2023    Order Specific Question:   Preferred imaging location?    Answer:   [facility]    Order Specific Question:   Is Oral Contrast requested for this exam?    Answer:   Yes, Per Radiology protocol   CBC with Differential    Standing Status:   Standing    Number of Occurrences:   2    Standing Expiration Date:   01/16/2023   Comprehensive metabolic panel    Standing Status:   Standing    Number of Occurrences:   2    Standing Expiration Date:   01/16/2023   Iron and TIBC([facility])    Standing Status:   Standing    Number of Occurrences:   2    Standing Expiration Date:   01/16/2023   Ferritin    Standing Status:   Standing    Number of Occurrences:   2    Standing Expiration Date:   01/16/2023   Follow up Venofer weekly x 3, CT chest abdomen pelvis w contrast  2 months, labs prior MD + Venofer same labs.+ CMP All questions were answered. The patient knows to call  the clinic with any problems, questions or concerns.  Earlie Server, MD, PhD [facility] Hematology Oncology 01/15/2022   CHIEF COMPLAINTS/REASON FOR VISIT:  pancytopenia  HISTORY OF PRESENTING ILLNESS:   Dennis Wise is a  71 y.o.  male with PMH listed below was seen in consultation at the request of  [name], MD  for evaluation of pancytopenia  03/06/2021, patient had blood work done.  CBC showed normal WBC of 4.6, hemoglobin 10.9, normal platelet count at 154,000.  Chemistry labs showed normal creatinine at 1.47 with a GFR of 47.3, normal bilirubin.  Potassium was elevated at 5.2.  Patient reports a history of MGUS.  Initially diagnosed by oncology Dr. Humphrey Rolls. 04/16/2021, kappa light chain ratio was elevated at 1.67.  04/17/2011, SPEP showed no monoclonal protein. 04/25/2011 24-hour urine protein 578. 04/25/2011 patient had a bone marrow biopsy and aspirate which showed a 3% of polyclonal plasma cells staining for kappa and lambda light chain and lack of large aggregates or sheets.  Overall changes are nonspecific.  Patient L was seen by oncology Dr. Nicholas Lose who did not feel that patient meets diagnostic criteria for MGUS and the patient was discharged.  06/04/2021, bone marrow biopsy showed hypercellular marrow with mild erythroid atypia.  Morphology features do not provide a precise explanation for patient's cytopenia.  Absence of significant morphological dysplasia makes an MDS less likely and alternative causes differential is  wide.  B-cell gene rearrangement was detected.  This is likely secondary to monoclonal lymphocytosis.  And T-cell gene arrangement were detected-this is likely reactive  MDS FISH panel is negative. NGS negative.  07/19/2021, CT images were reviewed and discussed with patient. -Patient has prominent/mildly enlarged mediastinal hilar and retroperitoneal lymph nodes.  Nonspecific.  Reactive versus malignant.-  # RF is elevated.  Patient has establish care with  rheumatology Dr. Posey Pronto and had autoimmune disease work-up to Dr.Patel did not feel he has RA>   Patient establish care with pulmonology Dr. Lanney Gins for abnormal CT findings.  Differential includes [name], hypersensitivity pneumonitis. He was treated with a course of prednisone  INTERVAL HISTORY Dennis Wise is a 71 y.o. male who has above history reviewed by me today presents for follow up visit for management of pancytopenia.  Patient was accompanied by wife.  Chronic fatigue, unchanged Denies any blood in stool, epigastric pain.  Weight loss, he reports eating healthy lately.   Review of Systems  Constitutional:  Positive for fatigue and unexpected weight change. Negative for appetite change, chills and fever.  HENT:   Negative for hearing loss and voice change.   Eyes:  Negative for eye problems and icterus.  Respiratory:  Negative for chest tightness, cough and shortness of breath.   Cardiovascular:  Negative for chest pain and leg swelling.  Gastrointestinal:  Negative for abdominal distention and abdominal pain.  Endocrine: Negative for hot flashes.  Genitourinary:  Positive for frequency. Negative for difficulty urinating and dysuria.   Musculoskeletal:  Negative for arthralgias.  Skin:  Negative for itching and rash.  Neurological:  Negative for light-headedness and numbness.  Hematological:  Negative for adenopathy. Does not bruise/bleed easily.  Psychiatric/Behavioral:  Negative for confusion.     MEDICAL HISTORY:  Past Medical History:  Diagnosis Date   Anemia    Iron deficiency part of this   Cataract    bilateral lens implants   Diabetes mellitus    type II   GERD (gastroesophageal reflux disease) 74/9449   Helicobacter pylori gastritis 06/11/2011   On EGD 05/2011    Hyperlipidemia    Hypertension 11/1999   IDA (iron deficiency anemia) 07/24/2021   Internal hemorrhoids    Iron deficiency anemia, unspecified 03/04/2011   MGUS (monoclonal gammopathy of  unknown significance)    possible dx initially, had negative f/u   Pancytopenia    Pancytopenia, acquired ([place])    Personal history of colonic adenomas 06/05/2012   Pneumonia     SURGICAL HISTORY: Past Surgical History:  Procedure Laterality Date   CATARACT EXTRACTION Bilateral    COLONOSCOPY     FLEXIBLE SIGMOIDOSCOPY  05/1988   internal hemorrhoids   KNEE SURGERY  09/04   lt knee fx repair ORIF   TIBIA FRACTURE SURGERY     left 2012    SOCIAL HISTORY: Social History   Socioeconomic History   Marital status: Married    Spouse name: Not on file   Number of children: Not on file   Years of education: Not on file   Highest education level: Not on file  Occupational History   Not on file  Tobacco Use   Smoking status: Never   Smokeless tobacco: Never  Vaping Use   Vaping Use: Never used  Substance and Sexual Activity   Alcohol use: Never   Drug use: No   Sexual activity: Yes  Other Topics Concern   Not on file  Social History Narrative   Prev  traveling for sports broadcasting    Working for [employer], [employer], [employer] as of 2019   Married 1970   1 child   [employer]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   Social Determinants of Health   Financial Resource Strain: Low Risk  (03/05/2021)   Overall Financial Resource Strain (CARDIA)    Difficulty of Paying Living Expenses: Not hard at all  Food Insecurity: No Food Insecurity (03/05/2021)   Hunger Vital Sign    Worried About Running Out of Food in the Last Year: Never true    Ran Out of Food in the Last Year: Never true  Transportation Needs: No Transportation Needs (03/05/2021)   PRAPARE - Hydrologist (Medical): No    Lack of Transportation (Non-Medical): No  Physical Activity: Unknown (03/05/2021)   Exercise Vital Sign    Days of Exercise per Week: 7 days    Minutes of Exercise per Session: Not on file  Stress: No Stress Concern Present (03/05/2021)   [place]    Feeling of Stress : Not at all  Social Connections: Moderately Isolated (03/05/2021)   Social Connection and Isolation Panel [NHANES]    Frequency of Communication with Friends and Family: Twice a week    Frequency of Social Gatherings with Friends and Family: More than three times a week    Attends Religious Services: Never    Marine scientist or Organizations: No    Attends Archivist Meetings: Never    Marital Status: Married  Human resources officer Violence: Not At Risk (03/05/2021)   Humiliation, Afraid, Rape, and Kick questionnaire    Fear of Current or Ex-Partner: No    Emotionally Abused: No    Physically Abused: No    Sexually Abused: No    FAMILY HISTORY: Family History  Problem Relation Age of Onset   Cancer Mother        ?   Diabetes Mother    Heart failure Mother    Emphysema Father    Cancer Father        ?   Alzheimer's disease Father    Skin cancer Brother    Diabetes Brother    Colon cancer Neg Hx    Prostate cancer Neg Hx    Esophageal cancer Neg Hx    Rectal cancer Neg Hx    Stomach cancer Neg Hx     ALLERGIES:  is allergic to promethazine hcl and other.  MEDICATIONS:  Current Outpatient Medications  Medication Sig Dispense Refill   ACCU-CHEK FASTCLIX LANCETS MISC Use to test blood sugar once daily or as needed.  Diagnosis:  E11.3299  Non insulin dependent. 102 each 3   aspirin 81 MG tablet Take 81 mg by mouth daily.     atorvastatin (LIPITOR) 10 MG tablet Take 1 tablet (10 mg total) by mouth at bedtime. 90 tablet 3   blood glucose meter kit and supplies KIT Dispense based on patient and insurance preference. Use up to four times daily as directed. (FOR ICD-9 250.00, 250.01). 1 each 0   Blood Glucose Monitoring Suppl (ACCU-CHEK NANO SMARTVIEW) w/Device KIT Use to check blood sugar once daily or as needed.  Diagnosis: E11.3299  Non insulin dependent. 1 kit 0   glimepiride (AMARYL) 2 MG tablet Take 2 mg by mouth daily  with breakfast.     glucose blood (ACCU-CHEK SMARTVIEW) test strip Use as instructed to test blood sugar once daily or as needed.  Diagnosis:  E11.3299  Non insulin dependent. 100 each 3   metFORMIN (GLUCOPHAGE) 850 MG tablet Take 1 tablet (850 mg total) by mouth 2 (two) times daily. 180 tablet 3   Multiple Vitamin (MULTIVITAMIN) tablet Take 1 tablet by mouth daily. Centrum Silver     niacin (NIASPAN) 500 MG CR tablet Take 3 tablets (1,500 mg total) by mouth daily.     pioglitazone (ACTOS) 45 MG tablet Take 1 tablet (45 mg total) by mouth daily. 90 tablet 3   No current facility-administered medications for this visit.     PHYSICAL EXAMINATION: ECOG PERFORMANCE STATUS: 1 - Symptomatic but completely ambulatory Vitals:   01/15/22 1419  BP: (!) 148/53  Pulse: (!) 54  Resp: 18  SpO2: 98%   Filed Weights   01/15/22 1417  Weight: 250 lb (113.4 kg)    Physical Exam Constitutional:      General: He is not in acute distress.    Appearance: He is obese.  HENT:     Head: Normocephalic and atraumatic.  Eyes:     General: No scleral icterus. Cardiovascular:     Rate and Rhythm: Normal rate and regular rhythm.     Heart sounds: Normal heart sounds.  Pulmonary:     Effort: Pulmonary effort is normal. No respiratory distress.     Breath sounds: No wheezing.  Abdominal:     General: Bowel sounds are normal. There is no distension.     Palpations: Abdomen is soft.  Musculoskeletal:        General: No deformity. Normal range of motion.     Cervical back: Normal range of motion and neck supple.     Right lower leg: Edema present.     Left lower leg: Edema present.  Skin:    General: Skin is warm and dry.     Findings: No erythema or rash.  Neurological:     Mental Status: He is alert and oriented to person, place, and time. Mental status is at baseline.     Cranial Nerves: No cranial nerve deficit.     Coordination: Coordination normal.  Psychiatric:        Mood and Affect: Mood  normal.     LABORATORY DATA:  I have reviewed the data as listed    Latest Ref Rng & Units 01/13/2022    2:42 PM 11/19/2021    2:26 PM 09/09/2021   10:38 AM  CBC  WBC 4.0 - 10.5 K/uL 6.0  5.0  3.9   Hemoglobin 13.0 - 17.0 g/dL 9.0  12.1  10.3   Hematocrit 39.0 - 52.0 % 27.3  35.3  32.5   Platelets 150 - 400 K/uL 120  100  81       Latest Ref Rng & Units 12/03/2021   10:40 AM 11/21/2021    4:00 PM 11/19/2021    2:26 PM  CMP  Glucose 70 - 99 mg/dL 158  212  238   BUN 6 - 23 mg/dL '26  17  27   ' Creatinine 0.40 - 1.50 mg/dL 1.37  1.21  1.47   Sodium 135 - 145 mEq/L 133  127  131   Potassium 3.5 - 5.1 mEq/L 4.7  4.2  4.7   Chloride 96 - 112 mEq/L 97  92  97   CO2 19 - 32 mEq/L '26  27  25   ' Calcium 8.4 - 10.5 mg/dL 9.2  9.0  9.4   Total Protein 6.5 - 8.1 g/dL  7.8   Total Bilirubin 0.3 - 1.2 mg/dL   1.2   Alkaline Phos 38 - 126 U/L   111   AST 15 - 41 U/L   23   ALT 0 - 44 U/L   19      Lab Results  Component Value Date   IRON 34 (L) 01/13/2022   TIBC 273 01/13/2022   FERRITIN 127 01/13/2022     CBC showed decreased hemoglobin at 10.3, platelet count 80,000.  Creatinine is 1.29 with a GFR of 59.  Slightly increased AST, normal ALT.  Elevated alkaline phosphatase.  Mildly decreased calcium.  Vitamin B12  544.  Ferritin 83, TIBC 342, iron saturation 21.  Reticulocyte panel showed reticulocyte hemoglobin 32.9.  Normal reticulocyte percentage and immature reticulocyte fraction, normal immature platelet fraction- inappropriate.  Negative SPEP, increased light chain ration, UPEP is negative. - he has no signs of gammopathy.  Peripheral blood showed CD5 positive, CD23 positive clonal B cells, less than 5000-consistent with monoclonal lymphocytosis.  RADIOGRAPHIC STUDIES: I have personally reviewed the radiological images as listed and agreed with the findings in the report. No results found.

## 2022-01-15 NOTE — Assessment & Plan Note (Addendum)
Splenomegaly and Prominent/mildly enlarged mediastinal, hilar and retroperitoneal lymph nodes Recommend repeat CT chest abdomen pelvis w contrast

## 2022-01-15 NOTE — Assessment & Plan Note (Signed)
Recommend observation CT showed prominent/mildly enlarged mediastinal, hilar, retroperitoneal lymph nodes which are nonspecific.

## 2022-01-15 NOTE — Assessment & Plan Note (Signed)
Iron panel showed decreased iron saturation of 13, ferritin 127 Recommend Venofer weekly x 4

## 2022-01-21 ENCOUNTER — Inpatient Hospital Stay: Payer: Medicare HMO | Attending: Oncology

## 2022-01-21 VITALS — BP 170/61 | HR 54 | Temp 96.7°F | Resp 18

## 2022-01-21 DIAGNOSIS — D508 Other iron deficiency anemias: Secondary | ICD-10-CM | POA: Insufficient documentation

## 2022-01-21 DIAGNOSIS — D61818 Other pancytopenia: Secondary | ICD-10-CM | POA: Diagnosis not present

## 2022-01-21 DIAGNOSIS — Z79899 Other long term (current) drug therapy: Secondary | ICD-10-CM | POA: Insufficient documentation

## 2022-01-21 MED ORDER — SODIUM CHLORIDE 0.9 % IV SOLN
Freq: Once | INTRAVENOUS | Status: AC
  Filled 2022-01-21: qty 250

## 2022-01-21 MED ORDER — SODIUM CHLORIDE 0.9 % IV SOLN
200.0000 mg | Freq: Once | INTRAVENOUS | Status: AC
  Administered 2022-01-21: 200 mg via INTRAVENOUS
  Filled 2022-01-21: qty 200

## 2022-01-28 ENCOUNTER — Inpatient Hospital Stay: Payer: Medicare HMO

## 2022-01-28 VITALS — BP 152/53 | HR 54 | Temp 97.0°F | Resp 16

## 2022-01-28 DIAGNOSIS — D508 Other iron deficiency anemias: Secondary | ICD-10-CM

## 2022-01-28 DIAGNOSIS — D61818 Other pancytopenia: Secondary | ICD-10-CM | POA: Diagnosis not present

## 2022-01-28 DIAGNOSIS — Z79899 Other long term (current) drug therapy: Secondary | ICD-10-CM | POA: Diagnosis not present

## 2022-01-28 MED ORDER — SODIUM CHLORIDE 0.9 % IV SOLN
Freq: Once | INTRAVENOUS | Status: AC
  Filled 2022-01-28: qty 250

## 2022-01-28 MED ORDER — SODIUM CHLORIDE 0.9 % IV SOLN
200.0000 mg | Freq: Once | INTRAVENOUS | Status: AC
  Administered 2022-01-28: 200 mg via INTRAVENOUS
  Filled 2022-01-28: qty 200

## 2022-02-01 ENCOUNTER — Other Ambulatory Visit: Payer: Self-pay | Admitting: Family Medicine

## 2022-02-04 ENCOUNTER — Inpatient Hospital Stay: Payer: Medicare HMO

## 2022-02-04 VITALS — BP 142/46 | HR 52

## 2022-02-04 DIAGNOSIS — Z79899 Other long term (current) drug therapy: Secondary | ICD-10-CM | POA: Diagnosis not present

## 2022-02-04 DIAGNOSIS — D61818 Other pancytopenia: Secondary | ICD-10-CM | POA: Diagnosis not present

## 2022-02-04 DIAGNOSIS — D508 Other iron deficiency anemias: Secondary | ICD-10-CM | POA: Diagnosis not present

## 2022-02-04 MED ORDER — SODIUM CHLORIDE 0.9 % IV SOLN
200.0000 mg | Freq: Once | INTRAVENOUS | Status: AC
  Administered 2022-02-04: 200 mg via INTRAVENOUS
  Filled 2022-02-04: qty 200

## 2022-02-04 MED ORDER — SODIUM CHLORIDE 0.9 % IV SOLN
INTRAVENOUS | Status: DC
  Filled 2022-02-04: qty 250

## 2022-02-19 DIAGNOSIS — M5459 Other low back pain: Secondary | ICD-10-CM | POA: Diagnosis not present

## 2022-02-19 DIAGNOSIS — M545 Low back pain, unspecified: Secondary | ICD-10-CM | POA: Diagnosis not present

## 2022-02-24 ENCOUNTER — Telehealth: Payer: Self-pay | Admitting: Family Medicine

## 2022-02-24 NOTE — Telephone Encounter (Signed)
Left message for patient to call back and schedule Medicare Annual Wellness Visit (AWV) either virtually or phone  Left  my jabber number [phone]   Last AWV 03/05/21    45 min for awv-i and in office appointments 30 min for awv-s  phone/virtual appointments

## 2022-02-26 DIAGNOSIS — D0422 Carcinoma in situ of skin of left ear and external auricular canal: Secondary | ICD-10-CM | POA: Diagnosis not present

## 2022-03-10 DIAGNOSIS — M5459 Other low back pain: Secondary | ICD-10-CM | POA: Diagnosis not present

## 2022-03-11 ENCOUNTER — Ambulatory Visit: Admission: RE | Admit: 2022-03-11 | Payer: Medicare HMO | Source: Ambulatory Visit

## 2022-03-11 ENCOUNTER — Inpatient Hospital Stay: Payer: Medicare HMO | Attending: Oncology

## 2022-03-11 ENCOUNTER — Encounter: Payer: Self-pay | Admitting: Oncology

## 2022-03-11 DIAGNOSIS — D61818 Other pancytopenia: Secondary | ICD-10-CM | POA: Diagnosis not present

## 2022-03-11 DIAGNOSIS — R590 Localized enlarged lymph nodes: Secondary | ICD-10-CM | POA: Diagnosis not present

## 2022-03-11 DIAGNOSIS — R748 Abnormal levels of other serum enzymes: Secondary | ICD-10-CM | POA: Diagnosis not present

## 2022-03-11 DIAGNOSIS — Z809 Family history of malignant neoplasm, unspecified: Secondary | ICD-10-CM | POA: Diagnosis not present

## 2022-03-11 DIAGNOSIS — Z808 Family history of malignant neoplasm of other organs or systems: Secondary | ICD-10-CM | POA: Diagnosis not present

## 2022-03-11 DIAGNOSIS — I10 Essential (primary) hypertension: Secondary | ICD-10-CM | POA: Diagnosis not present

## 2022-03-11 DIAGNOSIS — D508 Other iron deficiency anemias: Secondary | ICD-10-CM | POA: Diagnosis not present

## 2022-03-11 DIAGNOSIS — D696 Thrombocytopenia, unspecified: Secondary | ICD-10-CM | POA: Diagnosis not present

## 2022-03-11 DIAGNOSIS — Z8249 Family history of ischemic heart disease and other diseases of the circulatory system: Secondary | ICD-10-CM | POA: Insufficient documentation

## 2022-03-11 DIAGNOSIS — R634 Abnormal weight loss: Secondary | ICD-10-CM | POA: Diagnosis not present

## 2022-03-11 DIAGNOSIS — K760 Fatty (change of) liver, not elsewhere classified: Secondary | ICD-10-CM

## 2022-03-11 DIAGNOSIS — Z79899 Other long term (current) drug therapy: Secondary | ICD-10-CM | POA: Insufficient documentation

## 2022-03-11 DIAGNOSIS — R5383 Other fatigue: Secondary | ICD-10-CM | POA: Diagnosis not present

## 2022-03-11 DIAGNOSIS — Z8719 Personal history of other diseases of the digestive system: Secondary | ICD-10-CM | POA: Insufficient documentation

## 2022-03-11 DIAGNOSIS — Z836 Family history of other diseases of the respiratory system: Secondary | ICD-10-CM | POA: Diagnosis not present

## 2022-03-11 DIAGNOSIS — Z818 Family history of other mental and behavioral disorders: Secondary | ICD-10-CM | POA: Insufficient documentation

## 2022-03-11 DIAGNOSIS — R161 Splenomegaly, not elsewhere classified: Secondary | ICD-10-CM | POA: Insufficient documentation

## 2022-03-11 DIAGNOSIS — Z833 Family history of diabetes mellitus: Secondary | ICD-10-CM | POA: Diagnosis not present

## 2022-03-11 DIAGNOSIS — D72820 Lymphocytosis (symptomatic): Secondary | ICD-10-CM

## 2022-03-11 LAB — COMPREHENSIVE METABOLIC PANEL
ALT: 30 U/L (ref 0–44)
AST: 30 U/L (ref 15–41)
Albumin: 4 g/dL (ref 3.5–5.0)
Alkaline Phosphatase: 193 U/L — ABNORMAL HIGH (ref 38–126)
Anion gap: 9 (ref 5–15)
BUN: 38 mg/dL — ABNORMAL HIGH (ref 8–23)
CO2: 26 mmol/L (ref 22–32)
Calcium: 9.1 mg/dL (ref 8.9–10.3)
Chloride: 98 mmol/L (ref 98–111)
Creatinine, Ser: 1.22 mg/dL (ref 0.61–1.24)
GFR, Estimated: >60 mL/min (ref >60)
Glucose, Bld: 217 mg/dL — ABNORMAL HIGH (ref 70–99)
Potassium: 4.1 mmol/L (ref 3.5–5.1)
Sodium: 133 mmol/L — ABNORMAL LOW (ref 135–145)
Total Bilirubin: 1 mg/dL (ref 0.3–1.2)
Total Protein: 7 g/dL (ref 6.5–8.1)

## 2022-03-11 LAB — CBC WITH DIFFERENTIAL/PLATELET
Abs Immature Granulocytes: 0.01 10*3/uL (ref 0.00–0.07)
Basophils Absolute: 0 10*3/uL (ref 0.0–0.1)
Basophils Relative: 0 %
Eosinophils Absolute: 0.1 10*3/uL (ref 0.0–0.5)
Eosinophils Relative: 2 %
HCT: 35.2 % — ABNORMAL LOW (ref 39.0–52.0)
Hemoglobin: 11.7 g/dL — ABNORMAL LOW (ref 13.0–17.0)
Immature Granulocytes: 0 %
Lymphocytes Relative: 18 %
Lymphs Abs: 0.9 10*3/uL (ref 0.7–4.0)
MCH: 30.9 pg (ref 26.0–34.0)
MCHC: 33.2 g/dL (ref 30.0–36.0)
MCV: 92.9 fL (ref 80.0–100.0)
Monocytes Absolute: 0.3 10*3/uL (ref 0.1–1.0)
Monocytes Relative: 7 %
Neutro Abs: 3.6 10*3/uL (ref 1.7–7.7)
Neutrophils Relative %: 73 %
Platelets: 87 10*3/uL — ABNORMAL LOW (ref 150–400)
RBC: 3.79 MIL/uL — ABNORMAL LOW (ref 4.22–5.81)
RDW: 15.5 % (ref 11.5–15.5)
WBC: 5 10*3/uL (ref 4.0–10.5)
nRBC: 0 % (ref 0.0–0.2)

## 2022-03-11 LAB — IRON AND TIBC
Iron: 79 ug/dL (ref 45–182)
Saturation Ratios: 23 % (ref 17.9–39.5)
TIBC: 346 ug/dL (ref 250–450)
UIBC: 267 ug/dL

## 2022-03-11 LAB — FERRITIN: Ferritin: 150 ng/mL (ref 24–336)

## 2022-03-11 NOTE — Telephone Encounter (Signed)
Please r/s scan once it has been authorized and move Md/venofer (11/28) to be a few days after scan if needed.

## 2022-03-17 ENCOUNTER — Encounter: Payer: Self-pay | Admitting: Family Medicine

## 2022-03-17 ENCOUNTER — Ambulatory Visit (INDEPENDENT_AMBULATORY_CARE_PROVIDER_SITE_OTHER): Payer: Medicare HMO | Admitting: Family Medicine

## 2022-03-17 VITALS — BP 122/80 | HR 48 | Temp 97.0°F | Ht 73.0 in | Wt 266.0 lb

## 2022-03-17 DIAGNOSIS — Z23 Encounter for immunization: Secondary | ICD-10-CM

## 2022-03-17 DIAGNOSIS — E113299 Type 2 diabetes mellitus with mild nonproliferative diabetic retinopathy without macular edema, unspecified eye: Secondary | ICD-10-CM

## 2022-03-17 LAB — HEMOGLOBIN A1C: Hgb A1c MFr Bld: 7.4 % — ABNORMAL HIGH (ref 4.6–6.5)

## 2022-03-17 MED FILL — Iron Sucrose Inj 20 MG/ML (Fe Equiv): INTRAVENOUS | Qty: 10 | Status: AC

## 2022-03-17 NOTE — Patient Instructions (Addendum)
Go to the lab on the way out.   If you have mychart we'll likely use that to update you.    Take care.  Glad to see you. We'll make plans after I see your labs.   Flu shot today.

## 2022-03-17 NOTE — Progress Notes (Unsigned)
Diabetes:  Using medications without difficulties: yes Hypoglycemic episodes:no Hyperglycemic episodes:no Feet problems: no Blood Sugars averaging: not checked often.  eye exam within last year: done 01/06/22 A1c pending at OV.    Not lightheaded. H/o bradycardia noted.  He has f/u pending with ortho, Dr. Nelva Bush.    He has hematology f/u pending.  D/w pt about getting CT done through hematology.    Meds, vitals, and allergies reviewed.  ROS: Per HPI unless specifically indicated in ROS section   GEN: nad, alert and oriented HEENT: ncat NECK: supple w/o LA CV: rrr. PULM: ctab, no inc wob ABD: soft, +bs EXT: no edema SKIN: no acute rash  Diabetic foot exam: Normal inspection No skin breakdown No calluses  Normal DP pulses Normal sensation to light touch and monofilament except for dec in L foot to monofilament.   Nails normal

## 2022-03-18 ENCOUNTER — Inpatient Hospital Stay: Payer: Medicare HMO

## 2022-03-18 ENCOUNTER — Encounter: Payer: Self-pay | Admitting: Oncology

## 2022-03-18 ENCOUNTER — Inpatient Hospital Stay: Payer: Medicare HMO | Admitting: Oncology

## 2022-03-18 VITALS — BP 184/70 | HR 45 | Temp 96.0°F | Resp 18 | Wt 270.4 lb

## 2022-03-18 DIAGNOSIS — R748 Abnormal levels of other serum enzymes: Secondary | ICD-10-CM | POA: Diagnosis not present

## 2022-03-18 DIAGNOSIS — D508 Other iron deficiency anemias: Secondary | ICD-10-CM | POA: Diagnosis not present

## 2022-03-18 DIAGNOSIS — R161 Splenomegaly, not elsewhere classified: Secondary | ICD-10-CM | POA: Diagnosis not present

## 2022-03-18 DIAGNOSIS — D72820 Lymphocytosis (symptomatic): Secondary | ICD-10-CM | POA: Diagnosis not present

## 2022-03-18 DIAGNOSIS — R634 Abnormal weight loss: Secondary | ICD-10-CM | POA: Diagnosis not present

## 2022-03-18 DIAGNOSIS — I10 Essential (primary) hypertension: Secondary | ICD-10-CM | POA: Diagnosis not present

## 2022-03-18 DIAGNOSIS — R590 Localized enlarged lymph nodes: Secondary | ICD-10-CM | POA: Diagnosis not present

## 2022-03-18 DIAGNOSIS — R5383 Other fatigue: Secondary | ICD-10-CM | POA: Diagnosis not present

## 2022-03-18 DIAGNOSIS — D61818 Other pancytopenia: Secondary | ICD-10-CM | POA: Diagnosis not present

## 2022-03-18 DIAGNOSIS — D696 Thrombocytopenia, unspecified: Secondary | ICD-10-CM

## 2022-03-18 NOTE — Assessment & Plan Note (Addendum)
Weight has improved.  Observation.

## 2022-03-18 NOTE — Assessment & Plan Note (Signed)
Splenomegaly and Prominent/mildly enlarged mediastinal, hilar and retroperitoneal lymph nodes Recommend repeat CT chest abdomen pelvis w contrast-scheduled tomorrow. Further plan depending on CT findings.

## 2022-03-18 NOTE — Assessment & Plan Note (Signed)
Due to splenomegaly. Pending CT evaluation.

## 2022-03-18 NOTE — Progress Notes (Signed)
Hematology/Oncology Progress note [phone] [phone]         Patient Care Team: [name], MD as PCP - General (Family Medicine) Gearlean Alf, MD as Referring Physician (Ophthalmology) Nicholas Lose, MD as Consulting Physician (Hematology and Oncology) [name], [facility] as Pharmacist (Pharmacist)  ASSESSMENT & PLAN:   IDA (iron deficiency anemia) Labs reviewed and discussed with patient. Iron panel and hemoglobin level have both improved.  I will hold off additional IV Venofer treatment at this point.   Weight loss Weight has improved.  Observation.   Monoclonal B-cell lymphocytosis Splenomegaly and Prominent/mildly enlarged mediastinal, hilar and retroperitoneal lymph nodes Recommend repeat CT chest abdomen pelvis w contrast-scheduled tomorrow. Further plan depending on CT findings.  Thrombocytopenia (HCC) Due to splenomegaly. Pending CT evaluation.  Orders Placed This Encounter  Procedures   Immature Platelet Fraction    Standing Status:   Future    Standing Expiration Date:   03/19/2023   Follow up To be determined. All questions were answered. The patient knows to call the clinic with any problems, questions or concerns.  Earlie Server, MD, PhD [facility] Hematology Oncology 03/18/2022   CHIEF COMPLAINTS/REASON FOR VISIT:  Iron deficiency anemia splenomegaly, thrombocytopenia, monoclonal lymphocytosis  HISTORY OF PRESENTING ILLNESS:   Dennis Wise is a  71 y.o.  male presents for follow-up of iron deficiency anemia, splenomegaly, thrombocytopenia pia, monoclonal lymphocytosis  03/06/2021, patient had blood work done.  CBC showed normal WBC of 4.6, hemoglobin 10.9, normal platelet count at 154,000.  Chemistry labs showed normal creatinine at 1.47 with a GFR of 47.3, normal bilirubin.  Potassium was elevated at 5.2.  Patient reports a history of MGUS.  Initially diagnosed by oncology Dr. Humphrey Rolls. 04/16/2021, kappa light  chain ratio was elevated at 1.67.  04/17/2011, SPEP showed no monoclonal protein. 04/25/2011 24-hour urine protein 578. 04/25/2011 patient had a bone marrow biopsy and aspirate which showed a 3% of polyclonal plasma cells staining for kappa and lambda light chain and lack of large aggregates or sheets.  Overall changes are nonspecific.  Patient L was seen by oncology Dr. Nicholas Lose who did not feel that patient meets diagnostic criteria for MGUS and the patient was discharged.  06/04/2021, bone marrow biopsy showed hypercellular marrow with mild erythroid atypia.  Morphology features do not provide a precise explanation for patient's cytopenia.  Absence of significant morphological dysplasia makes an MDS less likely and alternative causes differential is wide.  B-cell gene rearrangement was detected.  This is likely secondary to monoclonal lymphocytosis.  And T-cell gene arrangement were detected-this is likely reactive  MDS FISH panel is negative. NGS negative.  07/19/2021, CT images were reviewed and discussed with patient. -Patient has prominent/mildly enlarged mediastinal hilar and retroperitoneal lymph nodes.  Nonspecific.  Reactive versus malignant.-  # RF is elevated.  Patient has establish care with rheumatology Dr. Posey Pronto and had autoimmune disease work-up to Dr.Patel did not feel he has RA   Patient establish care with pulmonology Dr. Lanney Gins for abnormal CT findings.  Differential includes [name], hypersensitivity pneumonitis. He was treated with a course of prednisone  INTERVAL HISTORY Dennis Wise is a 71 y.o. male who has above history reviewed by me today presents for follow up visit for management of Iron deficiency anemia splenomegaly, thrombocytopenia, monoclonal lymphocytosis Patient was accompanied by wife.  Chronic fatigue,  has improved since IV Venofer treatments Patient has also gained weight since last visit. His breathing has improved after being treated for  prednisone.  Review of Systems  Constitutional:  Positive for fatigue. Negative for appetite change, chills, fever and unexpected weight change.  HENT:   Negative for hearing loss and voice change.   Eyes:  Negative for eye problems and icterus.  Respiratory:  Negative for chest tightness, cough and shortness of breath.   Cardiovascular:  Negative for chest pain and leg swelling.  Gastrointestinal:  Negative for abdominal distention and abdominal pain.  Endocrine: Negative for hot flashes.  Genitourinary:  Positive for frequency. Negative for difficulty urinating and dysuria.   Musculoskeletal:  Negative for arthralgias.  Skin:  Negative for itching and rash.  Neurological:  Negative for light-headedness and numbness.  Hematological:  Negative for adenopathy. Does not bruise/bleed easily.  Psychiatric/Behavioral:  Negative for confusion.     MEDICAL HISTORY:  Past Medical History:  Diagnosis Date   Anemia    Iron deficiency part of this   Cataract    bilateral lens implants   Diabetes mellitus    type II   GERD (gastroesophageal reflux disease) 16/1096   Helicobacter pylori gastritis 06/11/2011   On EGD 05/2011    Hyperlipidemia    Hypertension 11/1999   IDA (iron deficiency anemia) 07/24/2021   Internal hemorrhoids    Iron deficiency anemia, unspecified 03/04/2011   MGUS (monoclonal gammopathy of unknown significance)    possible dx initially, had negative f/u   Pancytopenia    Pancytopenia, acquired ([place])    Personal history of colonic adenomas 06/05/2012   Pneumonia     SURGICAL HISTORY: Past Surgical History:  Procedure Laterality Date   CATARACT EXTRACTION Bilateral    COLONOSCOPY     FLEXIBLE SIGMOIDOSCOPY  05/1988   internal hemorrhoids   KNEE SURGERY  09/04   lt knee fx repair ORIF   TIBIA FRACTURE SURGERY     left 2012    SOCIAL HISTORY: Social History   Socioeconomic History   Marital status: Married    Spouse name: Not on file   Number of children:  Not on file   Years of education: Not on file   Highest education level: Not on file  Occupational History   Not on file  Tobacco Use   Smoking status: Never   Smokeless tobacco: Never  Vaping Use   Vaping Use: Never used  Substance and Sexual Activity   Alcohol use: Never   Drug use: No   Sexual activity: Yes  Other Topics Concern   Not on file  Social History Narrative   Prev traveling for sports broadcasting    Working for [employer] etc as of 2019   Married 1970   1 child   [employer]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   Social Determinants of Health   Financial Resource Strain: Low Risk  (03/05/2021)   Overall Financial Resource Strain (CARDIA)    Difficulty of Paying Living Expenses: Not hard at all  Food Insecurity: No Food Insecurity (03/05/2021)   Hunger Vital Sign    Worried About Running Out of Food in the Last Year: Never true    Ran Out of Food in the Last Year: Never true  Transportation Needs: No Transportation Needs (03/05/2021)   PRAPARE - Hydrologist (Medical): No    Lack of Transportation (Non-Medical): No  Physical Activity: Unknown (03/05/2021)   Exercise Vital Sign    Days of Exercise per Week: 7 days    Minutes of Exercise per Session: Not on file  Stress: No Stress Concern Present (  03/05/2021)   Marathon    Feeling of Stress : Not at all  Social Connections: Moderately Isolated (03/05/2021)   Social Connection and Isolation Panel [NHANES]    Frequency of Communication with Friends and Family: Twice a week    Frequency of Social Gatherings with Friends and Family: More than three times a week    Attends Religious Services: Never    Marine scientist or Organizations: No    Attends Archivist Meetings: Never    Marital Status: Married  Human resources officer Violence: Not At Risk (03/05/2021)   Humiliation, Afraid, Rape, and Kick questionnaire    Fear  of Current or Ex-Partner: No    Emotionally Abused: No    Physically Abused: No    Sexually Abused: No    FAMILY HISTORY: Family History  Problem Relation Age of Onset   Cancer Mother        ?   Diabetes Mother    Heart failure Mother    Emphysema Father    Cancer Father        ?   Alzheimer's disease Father    Skin cancer Brother    Diabetes Brother    Colon cancer Neg Hx    Prostate cancer Neg Hx    Esophageal cancer Neg Hx    Rectal cancer Neg Hx    Stomach cancer Neg Hx     ALLERGIES:  is allergic to promethazine hcl and other.  MEDICATIONS:  Current Outpatient Medications  Medication Sig Dispense Refill   ACCU-CHEK FASTCLIX LANCETS MISC Use to test blood sugar once daily or as needed.  Diagnosis:  E11.3299  Non insulin dependent. 102 each 3   aspirin 81 MG tablet Take 81 mg by mouth daily.     atorvastatin (LIPITOR) 10 MG tablet TAKE 1 TABLET AT BEDTIME 90 tablet 10   blood glucose meter kit and supplies KIT Dispense based on patient and insurance preference. Use up to four times daily as directed. (FOR ICD-9 250.00, 250.01). 1 each 0   Blood Glucose Monitoring Suppl (ACCU-CHEK NANO SMARTVIEW) w/Device KIT Use to check blood sugar once daily or as needed.  Diagnosis: E11.3299  Non insulin dependent. 1 kit 0   ferrous sulfate 325 (65 FE) MG EC tablet Take 325 mg by mouth 3 (three) times daily with meals.     glimepiride (AMARYL) 2 MG tablet TAKE 1 TABLET EVERY DAY 90 tablet 10   glucose blood (ACCU-CHEK SMARTVIEW) test strip Use as instructed to test blood sugar once daily or as needed.  Diagnosis:  E11.3299  Non insulin dependent. 100 each 3   metFORMIN (GLUCOPHAGE) 850 MG tablet TAKE 1 TABLET TWICE DAILY 180 tablet 10   Multiple Vitamin (MULTIVITAMIN) tablet Take 1 tablet by mouth daily. Centrum Silver     niacin (NIASPAN) 500 MG CR tablet Take 3 tablets (1,500 mg total) by mouth daily.     pioglitazone (ACTOS) 45 MG tablet TAKE 1 TABLET EVERY DAY 90 tablet 10   No  current facility-administered medications for this visit.     PHYSICAL EXAMINATION: ECOG PERFORMANCE STATUS: 1 - Symptomatic but completely ambulatory Vitals:   03/18/22 1317  BP: (!) 184/70  Pulse: (!) 45  Resp: 18  Temp: (!) 96 F (35.6 C)   Filed Weights   03/18/22 1317  Weight: 270 lb 6.4 oz (122.7 kg)    Physical Exam Constitutional:      General: He is  not in acute distress.    Appearance: He is obese.  HENT:     Head: Normocephalic and atraumatic.  Eyes:     General: No scleral icterus. Cardiovascular:     Rate and Rhythm: Normal rate and regular rhythm.     Heart sounds: Normal heart sounds.  Pulmonary:     Effort: Pulmonary effort is normal. No respiratory distress.     Breath sounds: No wheezing.  Abdominal:     General: Bowel sounds are normal. There is no distension.     Palpations: Abdomen is soft.  Musculoskeletal:        General: No deformity. Normal range of motion.     Cervical back: Normal range of motion and neck supple.     Right lower leg: Edema present.     Left lower leg: Edema present.  Skin:    General: Skin is warm and dry.     Findings: No erythema or rash.  Neurological:     Mental Status: He is alert and oriented to person, place, and time. Mental status is at baseline.     Cranial Nerves: No cranial nerve deficit.     Coordination: Coordination normal.  Psychiatric:        Mood and Affect: Mood normal.     LABORATORY DATA:  I have reviewed the data as listed    Latest Ref Rng & Units 03/11/2022    9:18 AM 01/13/2022    2:42 PM 11/19/2021    2:26 PM  CBC  WBC 4.0 - 10.5 K/uL 5.0  6.0  5.0   Hemoglobin 13.0 - 17.0 g/dL 11.7  9.0  12.1   Hematocrit 39.0 - 52.0 % 35.2  27.3  35.3   Platelets 150 - 400 K/uL 87  120  100       Latest Ref Rng & Units 03/11/2022    9:18 AM 12/03/2021   10:40 AM 11/21/2021    4:00 PM  CMP  Glucose 70 - 99 mg/dL 217  158  212   BUN 8 - 23 mg/dL 38  26  17   Creatinine 0.61 - 1.24 mg/dL 1.22  1.37   1.21   Sodium 135 - 145 mmol/L 133  133  127   Potassium 3.5 - 5.1 mmol/L 4.1  4.7  4.2   Chloride 98 - 111 mmol/L 98  97  92   CO2 22 - 32 mmol/L _0 Calcium 8.9 - 10.3 mg/dL 9.1  9.2  9.0   Total Protein 6.5 - 8.1 g/dL 7.0     Total Bilirubin 0.3 - 1.2 mg/dL 1.0     Alkaline Phos 38 - 126 U/L 193     AST 15 - 41 U/L 30     ALT 0 - 44 U/L 30        Lab Results  Component Value Date   IRON 79 03/11/2022   TIBC 346 03/11/2022   FERRITIN 150 03/11/2022     CBC showed decreased hemoglobin at 10.3, platelet count 80,000.  Creatinine is 1.29 with a GFR of 59.  Slightly increased AST, normal ALT.  Elevated alkaline phosphatase.  Mildly decreased calcium.  Vitamin B12  544.  Ferritin 83, TIBC 342, iron saturation 21.  Reticulocyte panel showed reticulocyte hemoglobin 32.9.  Normal reticulocyte percentage and immature reticulocyte fraction, normal immature platelet fraction- inappropriate.  Negative SPEP, increased light chain ration, UPEP is negative. - he has no signs of gammopathy.  Peripheral blood showed CD5 positive, CD23 positive clonal B cells, less than 5000-consistent with monoclonal lymphocytosis.  RADIOGRAPHIC STUDIES: I have personally reviewed the radiological images as listed and agreed with the findings in the report. No results found.

## 2022-03-18 NOTE — Progress Notes (Signed)
Pt here for follow up. Pt's CT was r/s to 11/29 due to ins auth.

## 2022-03-18 NOTE — Assessment & Plan Note (Signed)
Labs reviewed and discussed with patient. Iron panel and hemoglobin level have both improved.  I will hold off additional IV Venofer treatment at this point.

## 2022-03-19 ENCOUNTER — Ambulatory Visit
Admission: RE | Admit: 2022-03-19 | Discharge: 2022-03-19 | Disposition: A | Discharge status: Home / Self Care | Payer: Medicare HMO | Source: Ambulatory Visit | Attending: Oncology | Admitting: Oncology

## 2022-03-19 DIAGNOSIS — R601 Generalized edema: Secondary | ICD-10-CM | POA: Diagnosis not present

## 2022-03-19 DIAGNOSIS — J984 Other disorders of lung: Secondary | ICD-10-CM | POA: Insufficient documentation

## 2022-03-19 DIAGNOSIS — K8020 Calculus of gallbladder without cholecystitis without obstruction: Secondary | ICD-10-CM | POA: Insufficient documentation

## 2022-03-19 DIAGNOSIS — R590 Localized enlarged lymph nodes: Secondary | ICD-10-CM | POA: Insufficient documentation

## 2022-03-19 DIAGNOSIS — C4490 Unspecified malignant neoplasm of skin, unspecified: Secondary | ICD-10-CM | POA: Diagnosis not present

## 2022-03-19 DIAGNOSIS — R918 Other nonspecific abnormal finding of lung field: Secondary | ICD-10-CM | POA: Insufficient documentation

## 2022-03-19 DIAGNOSIS — R634 Abnormal weight loss: Secondary | ICD-10-CM | POA: Diagnosis not present

## 2022-03-19 DIAGNOSIS — I2510 Atherosclerotic heart disease of native coronary artery without angina pectoris: Secondary | ICD-10-CM | POA: Diagnosis not present

## 2022-03-19 DIAGNOSIS — I700 Atherosclerosis of aorta: Secondary | ICD-10-CM | POA: Diagnosis not present

## 2022-03-19 MED ORDER — IOHEXOL 300 MG/ML  SOLN
100.0000 mL | Freq: Once | INTRAMUSCULAR | Status: AC | PRN
  Administered 2022-03-19: 100 mL via INTRAVENOUS

## 2022-03-19 NOTE — Assessment & Plan Note (Signed)
Continue glimepiride metformin and Actos.  See notes on labs.  We can set follow-up when I see his labs.

## 2022-03-23 DIAGNOSIS — M545 Low back pain, unspecified: Secondary | ICD-10-CM | POA: Diagnosis not present

## 2022-03-23 DIAGNOSIS — M48061 Spinal stenosis, lumbar region without neurogenic claudication: Secondary | ICD-10-CM | POA: Diagnosis not present

## 2022-03-27 ENCOUNTER — Encounter: Payer: Self-pay | Admitting: Internal Medicine

## 2022-03-27 ENCOUNTER — Other Ambulatory Visit (INDEPENDENT_AMBULATORY_CARE_PROVIDER_SITE_OTHER): Payer: Medicare HMO

## 2022-03-27 ENCOUNTER — Ambulatory Visit: Payer: Medicare HMO | Admitting: Internal Medicine

## 2022-03-27 VITALS — BP 161/80 | HR 58 | Ht 73.0 in | Wt 268.0 lb

## 2022-03-27 DIAGNOSIS — R932 Abnormal findings on diagnostic imaging of liver and biliary tract: Secondary | ICD-10-CM | POA: Diagnosis not present

## 2022-03-27 DIAGNOSIS — D509 Iron deficiency anemia, unspecified: Secondary | ICD-10-CM

## 2022-03-27 DIAGNOSIS — R748 Abnormal levels of other serum enzymes: Secondary | ICD-10-CM | POA: Diagnosis not present

## 2022-03-27 DIAGNOSIS — J670 Farmer's lung: Secondary | ICD-10-CM | POA: Insufficient documentation

## 2022-03-27 LAB — HEPATIC FUNCTION PANEL
ALT: 14 U/L (ref 0–53)
AST: 17 U/L (ref 0–37)
Albumin: 4 g/dL (ref 3.5–5.2)
Alkaline Phosphatase: 152 U/L — ABNORMAL HIGH (ref 39–117)
Bilirubin, Direct: 0.2 mg/dL (ref 0.0–0.3)
Total Bilirubin: 0.8 mg/dL (ref 0.2–1.2)
Total Protein: 6.6 g/dL (ref 6.0–8.3)

## 2022-03-27 LAB — GAMMA GT: GGT: 59 U/L — ABNORMAL HIGH (ref 7–51)

## 2022-03-27 NOTE — Progress Notes (Signed)
Dennis Wise 71 y.o. May 20, 1950 [id number]  Assessment & Plan:   Encounter Diagnoses  Name Primary?   Iron deficiency anemia, unspecified iron deficiency anemia type Yes   Abnormal alkaline phosphatase test    Abnormal CT scan, liver     Plan for EGD and colonoscopy to evaluate iron deficiency anemia.The risks and benefits as well as alternatives of endoscopic procedure(s) have been discussed and reviewed. All questions answered. The patient agrees to proceed.   Elevated alkaline phosphatase test will be evaluated with repeat LFTs and GGT and 5 prime nucleotidase.  Enlarged caudate lobe on liver noted.  This could portend cirrhosis and I suppose he is at risk for NAFLD.  No signs of biliary obstruction as a cause.  Consider further liver workup pending these results.  CC: [name], MD Dr. [name]  Subjective:   Chief Complaint: Iron deficiency anemia  HPI 71 year old man with a history of monoclonal B-cell lymphocytosis, pancytopenia evaluated by oncology and hematology in September 2023 and was diagnosed with iron deficiency anemia, iron saturation was 21% with TIBC 342 and ferritin 83.  Looking back ferritin in 04/09/2021 was 8367 in May 2023 and 127 in September 23 and 150 in November 2023.  Saturation ratios have been low at 15% and 13% in May and September of this year, TIBC 273 and 363 respectively.  Bone marrow biopsy in February 23 3 showed diminished to absent iron stores.  He has been treated with Venofer by Dr. Tasia Catchings of heme-onc.  There has been loss of weight and he had a CT of the chest abdomen and pelvis on 03/19/2022.  Images reviewed personally.  MPRESSION: 1. Similar and slight improvement in thoracic adenopathy, favoring a benign/reactive etiology. No abdominopelvic adenopathy. 2. Similar to slight decrease in splenic size, upper normal. 3. Nonspecific caudate lobe enlargement. In this patient with a history of thrombocytopenia, recommend clinical  correlation for cirrhosis. Consider elastography. 4. Anasarca with decreased trace pelvic fluid. Question fluid overload. 5. Generally improved ground-glass nodularity throughout the lungs. New left lower lobe focal airspace disease and adjacent probable nodular airspace disease. Question minimal developing infection. 6. Cholelithiasis 7. Probable left proximal femur enchondroma, as before. 8. Coronary artery atherosclerosis. Aortic Atherosclerosis (ICD10-I70.0).    Alkaline phosphatase 193 on 03/11/2022 otherwise normal LFTs with AST and ALT 30.  Hemoglobin A1c was 9.2% in August and 7.4% on November 27.  He does not have any heartburn, dysphagia, abdominal pain diarrhea bleeding constipation or change in bowel habits.  He does not donate blood.  He takes a baby aspirin faithfully but no NSAIDs.  He uses Tylenol as needed otherwise and not much of that.   History of colon polyps last colonoscopy 2021.  Initial colonoscopy and EGD in 2013 because of iron deficiency anemia.  That was noticed after he had a tibial plateau fracture surgery.  The patient acknowledges weight loss but says he was intentionally trying to lose weight and that his hematologist oncologist was mistaken about that with respect to unintentional weight loss.  June 10 1310 polyps maximum 7 mm at least 9 adenomas 2014 no polyps 03/10/2020 6 diminutive adenomas recall planned for 2024  Last EGD was 2013 with erosive gastritis H. pylori positive and was treated.  And a follow-up stool antigen was negative.   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  He reports reduced hearing because of that.  Allergies  Allergen Reactions  Promethazine Hcl     REACTION: AGITIATION   Other Anxiety    phenerol made patient have anxiety   Current Meds  Medication Sig   ACCU-CHEK FASTCLIX LANCETS MISC Use to test blood sugar once daily or as needed.  Diagnosis:   E11.3299  Non insulin dependent.   aspirin 81 MG tablet Take 81 mg by mouth daily.   atorvastatin (LIPITOR) 10 MG tablet TAKE 1 TABLET AT BEDTIME   blood glucose meter kit and supplies KIT Dispense based on patient and insurance preference. Use up to four times daily as directed. (FOR ICD-9 250.00, 250.01).   Blood Glucose Monitoring Suppl (ACCU-CHEK NANO SMARTVIEW) w/Device KIT Use to check blood sugar once daily or as needed.  Diagnosis: E11.3299  Non insulin dependent.   glimepiride (AMARYL) 2 MG tablet TAKE 1 TABLET EVERY DAY   glucose blood (ACCU-CHEK SMARTVIEW) test strip Use as instructed to test blood sugar once daily or as needed.  Diagnosis:  E11.3299  Non insulin dependent.   metFORMIN (GLUCOPHAGE) 850 MG tablet TAKE 1 TABLET TWICE DAILY   Multiple Vitamin (MULTIVITAMIN) tablet Take 1 tablet by mouth daily. Centrum Silver   niacin (NIASPAN) 500 MG CR tablet Take 3 tablets (1,500 mg total) by mouth daily.   NON FORMULARY Pt take OTC iron 60 mg   pioglitazone (ACTOS) 45 MG tablet TAKE 1 TABLET EVERY DAY   [DISCONTINUED] NON FORMULARY Pt take   Past Medical History:  Diagnosis Date   Anemia    Iron deficiency part of this   Cataract    bilateral lens implants   Diabetes mellitus    type II   [name] ([place])    GERD (gastroesophageal reflux disease) 13/2440   Helicobacter pylori gastritis 06/11/2011   On EGD 05/2011    Hyperlipidemia    Hypertension 11/1999   IDA (iron deficiency anemia) 07/24/2021   Internal hemorrhoids    Iron deficiency anemia, unspecified 03/04/2011   MGUS (monoclonal gammopathy of unknown significance)    possible dx initially, had negative f/u   Pancytopenia    Pancytopenia, acquired ([place])    Personal history of colonic adenomas 06/05/2012   Pneumonia    Past Surgical History:  Procedure Laterality Date   CATARACT EXTRACTION Bilateral    COLONOSCOPY     FLEXIBLE SIGMOIDOSCOPY  05/1988   internal hemorrhoids   KNEE SURGERY  09/04   lt knee  fx repair ORIF   TIBIA FRACTURE SURGERY     left 2012   Social History   Social History Narrative   Prev traveling for sports broadcasting    Working for [employer], [employer], [employer] etc as of 2019   Married 1970   1 child   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   family history includes Alzheimer's disease in his father; Cancer in his father and mother; Diabetes in his brother and mother; Emphysema in his father; Heart failure in his mother; Skin cancer in his brother.   Review of Systems He has back pain and a diagnosis of spinal stenosis and is contemplating injection versus surgery versus nothing  Objective:   Physical Exam _0  (!) 161/80   Pulse (!) 58   Ht _1  (1.854 m)   Wt 268 lb (121.6 kg)   BMI 35.36 kg/m @  General:  Well-developed, well-nourished and in no acute distress Eyes:  anicteric. ENT:   Mouth and posterior pharynx free of lesions.  Neck:   supple w/o thyromegaly or mass.  Lungs: Clear to auscultation bilaterally.  Heart:   S1S2, there is a soft 2/6 systolic ejection murmur Abdomen: Somewhat obese, soft, non-tender, with a palpable spleen tip but no hepatomegaly, no hernia, or mass and BS+.  Rectal: Deferred Skin  extensive sun damage changes on the forearms and hands Neuro:  A&O x 3.  Psych:  appropriate mood and  Affect.   Data Reviewed: See HPI for extensive data reviewed includes labs, primary care notes, hematology oncology notes and direct viewing of CT abdomen pelvis and chest from 03/19/2022 as well as review of previous endoscopic evaluations.

## 2022-03-27 NOTE — Patient Instructions (Signed)
Your provider has requested that you go to the basement level for lab work before leaving today. Press "B" on the elevator. The lab is located at the first door on the left as you exit the elevator.  Due to recent changes in healthcare laws, you may see the results of your imaging and laboratory studies on MyChart before your provider has had a chance to review them.  We understand that in some cases there may be results that are confusing or concerning to you. Not all laboratory results come back in the same time frame and the provider may be waiting for multiple results in order to interpret others.  Please give [place] 48 hours in order for your provider to thoroughly review all the results before contacting the office for clarification of your results.   You have been scheduled for an endoscopy and colonoscopy. Please follow the written instructions given to you at your visit today. Please pick up your prep supplies at the pharmacy within the next 1-3 days. If you use inhalers (even only as needed), please bring them with you on the day of your procedure.  I appreciate the opportunity to care for you. Silvano Rusk, MD

## 2022-03-31 ENCOUNTER — Telehealth: Payer: Self-pay | Admitting: Family Medicine

## 2022-03-31 ENCOUNTER — Telehealth: Payer: Self-pay

## 2022-03-31 DIAGNOSIS — R9389 Abnormal findings on diagnostic imaging of other specified body structures: Secondary | ICD-10-CM | POA: Diagnosis not present

## 2022-03-31 DIAGNOSIS — D508 Other iron deficiency anemias: Secondary | ICD-10-CM

## 2022-03-31 DIAGNOSIS — R768 Other specified abnormal immunological findings in serum: Secondary | ICD-10-CM | POA: Diagnosis not present

## 2022-03-31 NOTE — Telephone Encounter (Signed)
LVM for pt to rtn my call to schedule AWV with [facility] call back # [phone] 

## 2022-03-31 NOTE — Telephone Encounter (Signed)
Called and spoke to pt's wife regarding results and recommendations.   Please schedule and inform pt:  Labs in 4 motnhs (cbc,cmp,iron,ferr) MD 1-2 days after labs   Pt will also see appts on Mychart

## 2022-03-31 NOTE — Telephone Encounter (Signed)
-----   Message from Earlie Server, MD sent at 03/30/2022 10:25 PM EST ----- CT showed possible liver cirrhosis, he is seeing GI for further work up.  Otherwise CT is ok.  Recommend him to follow up in 4 months. Lab prior to MD  Please order labs cbc cmp iron tibc ferritin

## 2022-04-01 LAB — NUCLEOTIDASE, 5', BLOOD: 5-Nucleotidase: 6 U/L (ref 0–10)

## 2022-04-07 DIAGNOSIS — Z961 Presence of intraocular lens: Secondary | ICD-10-CM | POA: Diagnosis not present

## 2022-04-07 DIAGNOSIS — H02834 Dermatochalasis of left upper eyelid: Secondary | ICD-10-CM | POA: Diagnosis not present

## 2022-04-07 DIAGNOSIS — E113513 Type 2 diabetes mellitus with proliferative diabetic retinopathy with macular edema, bilateral: Secondary | ICD-10-CM | POA: Diagnosis not present

## 2022-04-07 DIAGNOSIS — H02831 Dermatochalasis of right upper eyelid: Secondary | ICD-10-CM | POA: Diagnosis not present

## 2022-04-18 ENCOUNTER — Encounter: Payer: Self-pay | Admitting: Family Medicine

## 2022-04-18 ENCOUNTER — Telehealth (INDEPENDENT_AMBULATORY_CARE_PROVIDER_SITE_OTHER): Payer: Medicare HMO | Admitting: Family Medicine

## 2022-04-18 VITALS — Ht 73.0 in | Wt 270.0 lb

## 2022-04-18 DIAGNOSIS — U071 COVID-19: Secondary | ICD-10-CM | POA: Diagnosis not present

## 2022-04-18 MED ORDER — MOLNUPIRAVIR EUA 200MG CAPSULE: 4.0000 | ORAL_CAPSULE | Freq: Two times a day (BID) | ORAL | 0 refills | Status: AC

## 2022-04-18 NOTE — Progress Notes (Unsigned)
Interactive audio and video telecommunications were attempted between this provider and patient, however failed, due to patient having technical difficulties OR patient did not have access to video capability.  We continued and completed visit with audio only.   Virtual Visit via Telephone Note  I connected with patient on 04/18/22  at 4:43 PM  by telephone and verified that I am speaking with the correct person using two identifiers.  Location of patient: home   Location of MD: [place] Name of referring provider (if blank then none associated): Names per persons and role in encounter:  MD: Earlyne Iba, Patient: name listed above.    I discussed the limitations, risks, security and privacy concerns of performing an evaluation and management service by telephone and the availability of in person appointments. I also discussed with the patient that there may be a patient responsible charge related to this service. The patient expressed understanding and agreed to proceed.  CC: covid  History of Present Illness: Tested positive for covid today. Patient has been having runny nose, chills, HA and fatigue x 1 week. Has been taking otc tussin cough syrup. No CP. Not SOB.  Sleeping well and has mild sx at this point.    Wife and other family members are positive for covid.      Observations/Objective:***    Assessment and Plan:***    Covid, hold molnupiravir tonight given his sx and duration.  If worse tonight, then could start.  O/w wouldn't need to start.     Follow Up Instructions: see above.      I discussed the assessment and treatment plan with the patient. The patient was provided an opportunity to ask questions and all were answered. The patient agreed with the plan and demonstrated an understanding of the instructions.   The patient was advised to call back or seek an in-person evaluation if the symptoms worsen or if the condition fails to improve as anticipated.  I  provided 13 minutes of non-face-to-face time during this encounter.  Elsie Stain, MD

## 2022-04-20 DIAGNOSIS — U071 COVID-19: Secondary | ICD-10-CM | POA: Insufficient documentation

## 2022-04-20 NOTE — Assessment & Plan Note (Signed)
Covid, hold molnupiravir tonight given his sx and duration.  If worse tonight, then could start.  O/w wouldn't need to start.  Sleeping well and has mild sx at this point.  Routine masking/quarantine considerations discussed with patient.  He can update [place] as needed.

## 2022-05-08 ENCOUNTER — Encounter: Payer: Self-pay | Admitting: Internal Medicine

## 2022-05-11 ENCOUNTER — Encounter: Payer: Self-pay | Admitting: Certified Registered Nurse Anesthetist

## 2022-05-13 ENCOUNTER — Ambulatory Visit (INDEPENDENT_AMBULATORY_CARE_PROVIDER_SITE_OTHER): Payer: Medicare HMO

## 2022-05-13 VITALS — Wt 270.0 lb

## 2022-05-13 DIAGNOSIS — Z0000 Encounter for general adult medical examination without abnormal findings: Secondary | ICD-10-CM | POA: Diagnosis not present

## 2022-05-13 NOTE — Patient Instructions (Signed)
[name] , Thank you for taking time to come for your Medicare Wellness Visit. I appreciate your ongoing commitment to your health goals. Please review the following plan we discussed and let me know if I can assist you in the future.   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      Patient Stated     02/21/2019, I will continue to eat a healthier diet and exercise more daily.      Patient Stated     Would like to drink more water and watch diet.     Patient Stated     Lose a little weight         This is a list of the screening recommended for you and due dates:  Health Maintenance  Topic Date Due   Zoster (Shingles) Vaccine (1 of 2) Never done   Yearly kidney health urinalysis for diabetes  10/17/2010   DTaP/Tdap/Td vaccine (3 - Tdap) 10/17/2019   Eye exam for diabetics  09/03/2021   COVID-19 Vaccine (4 - 2023-24 season) 12/20/2021   Hemoglobin A1C  09/15/2022   Colon Cancer Screening  03/07/2023   Yearly kidney function blood test for diabetes  03/12/2023   Complete foot exam   03/18/2023   Medicare Annual Wellness Visit  05/14/2023   Pneumonia Vaccine  Completed   Flu Shot  Completed   Hepatitis C Screening: USPSTF Recommendation to screen - Ages 18-79 yo.  Completed   HPV Vaccine  Aged Out    Advanced directives: Please bring a copy of your health care power of attorney and living will to the office at your convenience.  Conditions/risks identified: lose a little weight   Next appointment: Follow up in one year for your annual wellness visit.   Preventive Care 13 Years and Older, Male  Preventive care refers to lifestyle choices and visits with your health care provider that can promote health and wellness. What does preventive care include? A yearly physical exam. This is also called an annual well check. Dental  exams once or twice a year. Routine eye exams. Ask your health care provider how often you should have your eyes checked. Personal lifestyle choices, including: Daily care of your teeth and gums. Regular physical activity. Eating a healthy diet. Avoiding tobacco and drug use. Limiting alcohol use. Practicing safe sex. Taking low doses of aspirin every day. Taking vitamin and mineral supplements as recommended by your health care provider. What happens during an annual well check? The services and screenings done by your health care provider during your annual well check will depend on your age, overall health, lifestyle risk factors, and family history of disease. Counseling  Your health care provider may ask you questions about your: Alcohol use. Tobacco use. Drug use. Emotional well-being. Home and relationship well-being. Sexual activity. Eating habits. History of falls. Memory and ability to understand (cognition). Work and work Statistician. Screening  You may have the following tests or measurements: Height, weight, and BMI. Blood pressure. Lipid and cholesterol levels. These may be checked every 5 years, or more frequently if you are over 53 years old. Skin check. Lung cancer screening. You may have this screening every year starting at age 51 if you have a 30-pack-year history of smoking and currently smoke or have quit  within the past 15 years. Fecal occult blood test (FOBT) of the stool. You may have this test every year starting at age 58. Flexible sigmoidoscopy or colonoscopy. You may have a sigmoidoscopy every 5 years or a colonoscopy every 10 years starting at age 61. Prostate cancer screening. Recommendations will vary depending on your family history and other risks. Hepatitis C blood test. Hepatitis B blood test. Sexually transmitted disease (STD) testing. Diabetes screening. This is done by checking your blood sugar (glucose) after you have not eaten for a while  (fasting). You may have this done every 1-3 years. Abdominal aortic aneurysm (AAA) screening. You may need this if you are a current or former smoker. Osteoporosis. You may be screened starting at age 17 if you are at high risk. Talk with your health care provider about your test results, treatment options, and if necessary, the need for more tests. Vaccines  Your health care provider may recommend certain vaccines, such as: Influenza vaccine. This is recommended every year. Tetanus, diphtheria, and acellular pertussis (Tdap, Td) vaccine. You may need a Td booster every 10 years. Zoster vaccine. You may need this after age 44. Pneumococcal 13-valent conjugate (PCV13) vaccine. One dose is recommended after age 75. Pneumococcal polysaccharide (PPSV23) vaccine. One dose is recommended after age 74. Talk to your health care provider about which screenings and vaccines you need and how often you need them. This information is not intended to replace advice given to you by your health care provider. Make sure you discuss any questions you have with your health care provider. Document Released: 05/04/2015 Document Revised: 12/26/2015 Document Reviewed: 02/06/2015 Elsevier Interactive Patient Education  2017 [place] Prevention in the Home Falls can cause injuries. They can happen to people of all ages. There are many things you can do to make your home safe and to help prevent falls. What can I do on the outside of my home? Regularly fix the edges of walkways and driveways and fix any cracks. Remove anything that might make you trip as you walk through a door, such as a raised step or threshold. Trim any bushes or trees on the path to your home. Use bright outdoor lighting. Clear any walking paths of anything that might make someone trip, such as rocks or tools. Regularly check to see if handrails are loose or broken. Make sure that both sides of any steps have handrails. Any raised  decks and porches should have guardrails on the edges. Have any leaves, snow, or ice cleared regularly. Use sand or salt on walking paths during winter. Clean up any spills in your garage right away. This includes oil or grease spills. What can I do in the bathroom? Use night lights. Install grab bars by the toilet and in the tub and shower. Do not use towel bars as grab bars. Use non-skid mats or decals in the tub or shower. If you need to sit down in the shower, use a plastic, non-slip stool. Keep the floor dry. Clean up any water that spills on the floor as soon as it happens. Remove soap buildup in the tub or shower regularly. Attach bath mats securely with double-sided non-slip rug tape. Do not have throw rugs and other things on the floor that can make you trip. What can I do in the bedroom? Use night lights. Make sure that you have a light by your bed that is easy to reach. Do not use any sheets or blankets that are too  big for your bed. They should not hang down onto the floor. Have a firm chair that has side arms. You can use this for support while you get dressed. Do not have throw rugs and other things on the floor that can make you trip. What can I do in the kitchen? Clean up any spills right away. Avoid walking on wet floors. Keep items that you use a lot in easy-to-reach places. If you need to reach something above you, use a strong step stool that has a grab bar. Keep electrical cords out of the way. Do not use floor polish or wax that makes floors slippery. If you must use wax, use non-skid floor wax. Do not have throw rugs and other things on the floor that can make you trip. What can I do with my stairs? Do not leave any items on the stairs. Make sure that there are handrails on both sides of the stairs and use them. Fix handrails that are broken or loose. Make sure that handrails are as long as the stairways. Check any carpeting to make sure that it is firmly attached  to the stairs. Fix any carpet that is loose or worn. Avoid having throw rugs at the top or bottom of the stairs. If you do have throw rugs, attach them to the floor with carpet tape. Make sure that you have a light switch at the top of the stairs and the bottom of the stairs. If you do not have them, ask someone to add them for you. What else can I do to help prevent falls? Wear shoes that: Do not have high heels. Have rubber bottoms. Are comfortable and fit you well. Are closed at the toe. Do not wear sandals. If you use a stepladder: Make sure that it is fully opened. Do not climb a closed stepladder. Make sure that both sides of the stepladder are locked into place. Ask someone to hold it for you, if possible. Clearly mark and make sure that you can see: Any grab bars or handrails. First and last steps. Where the edge of each step is. Use tools that help you move around (mobility aids) if they are needed. These include: Canes. Walkers. Scooters. Crutches. Turn on the lights when you go into a dark area. Replace any light bulbs as soon as they burn out. Set up your furniture so you have a clear path. Avoid moving your furniture around. If any of your floors are uneven, fix them. If there are any pets around you, be aware of where they are. Review your medicines with your doctor. Some medicines can make you feel dizzy. This can increase your chance of falling. Ask your doctor what other things that you can do to help prevent falls. This information is not intended to replace advice given to you by your health care provider. Make sure you discuss any questions you have with your health care provider. Document Released: 02/01/2009 Document Revised: 09/13/2015 Document Reviewed: 05/12/2014 Elsevier Interactive Patient Education  2017 [employer].

## 2022-05-13 NOTE — Progress Notes (Signed)
I connected with  Dennis Wise on 05/13/22 by a audio enabled telemedicine application and verified that I am speaking with the correct person using two identifiers.  Patient Location: Home  Provider Location: Office/Clinic  I discussed the limitations of evaluation and management by telemedicine. The patient expressed understanding and agreed to proceed.  Subjective:   Dennis Wise is a 72 y.o. male who presents for Medicare Annual/Subsequent preventive examination.  Review of Systems     Cardiac Risk Factors include: advanced age (>61mn, >>86women);male gender;diabetes mellitus;dyslipidemia;hypertension;obesity (BMI >30kg/m2)     Objective:    Today's Vitals   05/13/22 1504  Weight: 270 lb (122.5 kg)   Body mass index is 35.62 kg/m.     05/13/2022    3:09 PM 03/18/2022    1:05 PM 01/15/2022    2:19 PM 11/19/2021    2:21 PM 09/10/2021    2:16 PM 09/10/2021    1:21 PM 07/24/2021   10:51 AM  Advanced Directives  Does Patient Have a Medical Advance Directive? Yes Yes Yes No Yes Yes Yes  Type of [place]amed[place]of [place] will [place] will [place] will      Does patient want to make changes to medical advance directive?   No - Patient declined  No - Patient declined    Copy of [place] Chart? No - copy requested No - copy requested No - copy requested        Current Medications (verified) Outpatient Encounter Medications as of 05/13/2022  Medication Sig   ACCU-CHEK FASTCLIX LANCETS MISC Use to test blood sugar once daily or as needed.  Diagnosis:  E11.3299  Non insulin dependent.   aspirin 81 MG tablet Take 81 mg by mouth daily.   atorvastatin (LIPITOR) 10 MG tablet TAKE 1 TABLET AT BEDTIME   blood glucose meter kit and supplies KIT Dispense based on patient and insurance preference. Use up to four times daily as directed. (FOR ICD-9 250.00, 250.01).   Blood Glucose  Monitoring Suppl (ACCU-CHEK NANO SMARTVIEW) w/Device KIT Use to check blood sugar once daily or as needed.  Diagnosis: E11.3299  Non insulin dependent.   glimepiride (AMARYL) 2 MG tablet TAKE 1 TABLET EVERY DAY   glucose blood (ACCU-CHEK SMARTVIEW) test strip Use as instructed to test blood sugar once daily or as needed.  Diagnosis:  E11.3299  Non insulin dependent.   metFORMIN (GLUCOPHAGE) 850 MG tablet TAKE 1 TABLET TWICE DAILY   Multiple Vitamin (MULTIVITAMIN) tablet Take 1 tablet by mouth daily. Centrum Silver   niacin (NIASPAN) 500 MG CR tablet Take 3 tablets (1,500 mg total) by mouth daily.   NON FORMULARY Pt take OTC iron 60 mg   pioglitazone (ACTOS) 45 MG tablet TAKE 1 TABLET EVERY DAY   No facility-administered encounter medications on file as of 05/13/2022.    Allergies (verified) Promethazine hcl   History: Past Medical History:  Diagnosis Date   Anemia    Iron deficiency part of this   Cataract    bilateral lens implants   Diabetes mellitus    type II   [name] [place]    GERD (gastroesophageal reflux disease) 192/1194  Helicobacter pylori gastritis 06/11/2011   On EGD 05/2011    Hyperlipidemia    Hypertension 11/1999   Internal hemorrhoids    Iron deficiency anemia, unspecified 03/04/2011   MGUS (monoclonal gammopathy of unknown significance)    possible dx initially, had  negative f/u   Pancytopenia, acquired ([place])    Personal history of colonic adenomas 06/05/2012   Pneumonia    Past Surgical History:  Procedure Laterality Date   CATARACT EXTRACTION Bilateral    COLONOSCOPY     ESOPHAGOGASTRODUODENOSCOPY     2012 H. pylori gastritis   FLEXIBLE SIGMOIDOSCOPY  05/1988   internal hemorrhoids   KNEE SURGERY  12/2002   lt knee fx repair ORIF   TIBIA FRACTURE SURGERY     left 2012   Family History  Problem Relation Age of Onset   Cancer Mother        ?   Diabetes Mother    Heart failure Mother    Emphysema Father    Cancer Father        ?    Alzheimer's disease Father    Skin cancer Brother    Diabetes Brother    Colon cancer Neg Hx    Prostate cancer Neg Hx    Esophageal cancer Neg Hx    Rectal cancer Neg Hx    Stomach cancer Neg Hx    Social History   Socioeconomic History   Marital status: Married    Spouse name: Not on file   Number of children: Not on file   Years of education: Not on file   Highest education level: Not on file  Occupational History   Not on file  Tobacco Use   Smoking status: Never   Smokeless tobacco: Never  Vaping Use   Vaping Use: Never used  Substance and Sexual Activity   Alcohol use: Never   Drug use: No   Sexual activity: Yes  Other Topics Concern   Not on file  Social History Narrative   Prev traveling for sports broadcasting    Working for [employer] as of 2019   Married 1970   1 child   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   Social Determinants of Health   Financial Resource Strain: Low Risk  (05/13/2022)   Overall Financial Resource Strain (CARDIA)    Difficulty of Paying Living Expenses: Not hard at all  Food Insecurity: No Food Insecurity (05/13/2022)   Hunger Vital Sign    Worried About Running Out of Food in the Last Year: Never true    Ran Out of Food in the Last Year: Never true  Transportation Needs: No Transportation Needs (05/13/2022)   PRAPARE - Hydrologist (Medical): No    Lack of Transportation (Non-Medical): No  Physical Activity: Sufficiently Active (05/13/2022)   Exercise Vital Sign    Days of Exercise per Week: 7 days    Minutes of Exercise per Session: 30 min  Stress: No Stress Concern Present (05/13/2022)   [place]    Feeling of Stress : Not at all  Social Connections: Moderately Isolated (05/13/2022)   Social Connection and Isolation Panel [NHANES]    Frequency of Communication with Friends and Family: More than three times a week    Frequency of Social  Gatherings with Friends and Family: More than three times a week    Attends Religious Services: Never    Marine scientist or Organizations: No    Attends Archivist Meetings: Never    Marital Status: Married    Tobacco Counseling Counseling given: Not Answered   Clinical Intake:  Pre-visit preparation completed: Yes  Pain : No/denies pain     BMI - recorded:  35.62 Nutritional Status: BMI > 30  Obese Nutritional Risks: None Diabetes: Yes CBG done?: No Did pt. bring in CBG monitor from home?: No  How often do you need to have someone help you when you read instructions, pamphlets, or other written materials from your doctor or pharmacy?: 1 - Never  Diabetic?Nutrition Risk Assessment:  Has the patient had any N/V/D within the last 2 months?  No  Does the patient have any non-healing wounds?  No  Has the patient had any unintentional weight loss or weight gain?  No   Diabetes:  Is the patient diabetic?  Yes  If diabetic, was a CBG obtained today?  No  Did the patient bring in their glucometer from home?  No  How often do you monitor your CBG's? As needed .   Financial Strains and Diabetes Management:  Are you having any financial strains with the device, your supplies or your medication? No .  Does the patient want to be seen by Chronic Care Management for management of their diabetes?  No  Would the patient like to be referred to a Nutritionist or for Diabetic Management?  No   Diabetic Exams:  Diabetic Eye Exam: Overdue for diabetic eye exam. Pt has been advised about the importance in completing this exam. Patient advised to call and schedule an eye exam. Diabetic Foot Exam: Completed 03/17/22   Interpreter Needed?: No  Information entered by :: Charlott Rakes, LPN   Activities of Daily Living    05/13/2022    3:11 PM 06/04/2021    8:05 AM  In your present state of health, do you have any difficulty performing the following activities:   Hearing? 1 0  Comment HOH   Vision? 0 0  Difficulty concentrating or making decisions? 0 0  Walking or climbing stairs? 0 0  Dressing or bathing? 0 0  Doing errands, shopping? 0   Preparing Food and eating ? N   Using the Toilet? N   In the past six months, have you accidently leaked urine? N   Do you have problems with loss of bowel control? N   Managing your Medications? N   Managing your Finances? N   Housekeeping or managing your Housekeeping? N     Patient Care Team: [name], MD as PCP - General (Family Medicine) Gearlean Alf, MD as Referring Physician (Ophthalmology) Nicholas Lose, MD as Consulting Physician (Hematology and Oncology) [name], [facility] as Pharmacist (Pharmacist)  Indicate any recent Medical Services you may have received from other than [facility] providers in the past year (date may be approximate).     Assessment:   This is a routine wellness examination for [facility].  Hearing/Vision screen Hearing Screening - Comments:: Pt is HOH  Vision Screening - Comments:: Pt follows up with Dr Cordelia Pen at [place] for annual eye exams   Dietary issues and exercise activities discussed: Current Exercise Habits: Home exercise routine, Type of exercise: walking, Time (Minutes): 30, Frequency (Times/Week): 7, Weekly Exercise (Minutes/Week): 210   Goals Addressed             This Visit's Progress    Patient Stated       Lose a little weight        Depression Screen    05/13/2022    3:09 PM 03/17/2022    9:39 AM 03/05/2021    9:04 AM 03/01/2020    8:32 AM 02/21/2019    2:00 PM 02/09/2018    4:28 PM  01/30/2017    9:40 AM  PHQ 2/9 Scores  PHQ - 2 Score 0 0 0 0 0 0 0  PHQ- 9 Score     0  0    Fall Risk    05/13/2022    3:10 PM 03/17/2022    9:39 AM 03/05/2021    9:03 AM 03/01/2020    8:32 AM 02/21/2019    2:00 PM  [place] in the past year? 1 1 0 0 0  Number falls in past yr: 1 0 0  0  Injury with Fall? 1 0 0  0  Comment  hit head      Risk for fall due to : Impaired vision;Impaired balance/gait History of fall(s) No Fall Risks  Medication side effect  Follow up Falls prevention discussed Falls evaluation completed Falls prevention discussed  Falls evaluation completed;Falls prevention discussed    FALL RISK PREVENTION PERTAINING TO THE HOME:  Any stairs in or around the home? Yes  If so, are there any without handrails? No  Home free of loose throw rugs in walkways, pet beds, electrical cords, etc? Yes  Adequate lighting in your home to reduce risk of falls? Yes   ASSISTIVE DEVICES UTILIZED TO PREVENT FALLS:  Life alert? No  Use of a cane, walker or w/c? No  Grab bars in the bathroom? Yes  Shower chair or bench in shower? Yes  Elevated toilet seat or a handicapped toilet? Yes   TIMED UP AND GO:  Was the test performed? No .   Cognitive Function:    02/21/2019    2:03 PM 01/30/2017    9:40 AM 10/22/2015    8:21 AM  MMSE - Mini Mental State Exam  Orientation to time '5 5 5  '$ Orientation to Place '5 5 5  '$ Registration '3 3 3  '$ Attention/ Calculation 5 0 0  Recall '3 3 3  '$ Language- name 2 objects  0 0  Language- repeat '1 1 1  '$ Language- follow 3 step command  3 3  Language- read & follow direction  0 0  Write a sentence  0 0  Copy design  0 0  Total score  20 20        05/13/2022    3:12 PM  6CIT Screen  What Year? 0 points  What month? 0 points  What time? 0 points  Count back from 20 0 points  Months in reverse 0 points  Repeat phrase 0 points  Total Score 0 points    Immunizations Immunization History  Administered Date(s) Administered   Fluad Quad(high Dose 65+) 02/28/2019, 03/01/2020, 03/11/2021, 03/17/2022   Influenza Split 03/03/2011, 02/03/2012   Influenza,inj,Quad PF,6+ Mos 02/28/2013, 02/20/2015, 05/12/2016, 01/30/2017, 02/09/2018   PFIZER(Purple Top)SARS-COV-2 Vaccination 05/31/2019, 06/21/2019, 03/26/2020   Pneumococcal Conjugate-13 10/14/2013   Pneumococcal  Polysaccharide-23 08/22/1998, 03/03/2011, 01/30/2017   Td 08/22/1998, 10/16/2009   Zoster, Live 02/28/2013    TDAP status: Due, Education has been provided regarding the importance of this vaccine. Advised may receive this vaccine at local pharmacy or Health Dept. Aware to provide a copy of the vaccination record if obtained from local pharmacy or Health Dept. Verbalized acceptance and understanding.  Flu Vaccine status: Up to date  Pneumococcal vaccine status: Up to date  Covid-19 vaccine status: Completed vaccines  Qualifies for Shingles Vaccine? Yes   Zostavax completed No   Shingrix Completed?: No.    Education has been provided regarding the importance of this vaccine. Patient has been  advised to call insurance company to determine out of pocket expense if they have not yet received this vaccine. Advised may also receive vaccine at local pharmacy or Health Dept. Verbalized acceptance and understanding.  Screening Tests Health Maintenance  Topic Date Due   Zoster Vaccines- Shingrix (1 of 2) Never done   Diabetic kidney evaluation - Urine ACR  10/17/2010   DTaP/Tdap/Td (3 - Tdap) 10/17/2019   OPHTHALMOLOGY EXAM  09/03/2021   COVID-19 Vaccine (4 - 2023-24 season) 12/20/2021   HEMOGLOBIN A1C  09/15/2022   COLONOSCOPY (Pts 45-41yr Insurance coverage will need to be confirmed)  03/07/2023   Diabetic kidney evaluation - eGFR measurement  03/12/2023   FOOT EXAM  03/18/2023   Medicare Annual Wellness (AWV)  05/14/2023   Pneumonia Vaccine 72 Years old  Completed   INFLUENZA VACCINE  Completed   Hepatitis C Screening  Completed   HPV VACCINES  Aged Out    Health Maintenance  Health Maintenance Due  Topic Date Due   Zoster Vaccines- Shingrix (1 of 2) Never done   Diabetic kidney evaluation - Urine ACR  10/17/2010   DTaP/Tdap/Td (3 - Tdap) 10/17/2019   OPHTHALMOLOGY EXAM  09/03/2021   COVID-19 Vaccine (4 - 2023-24 season) 12/20/2021    Colorectal cancer screening: Type of  screening: Colonoscopy. Completed 03/06/20. Repeat every 3 years pt stated appt scheduled    Additional Screening:  Hepatitis C Screening:  Completed 10/16/15  Vision Screening: Recommended annual ophthalmology exams for early detection of glaucoma and other disorders of the eye. Is the patient up to date with their annual eye exam?  No  Who is the provider or what is the name of the office in which the patient attends annual eye exams? Dr GCordelia Pen If pt is not established with a provider, would they like to be referred to a provider to establish care? no.   Dental Screening: Recommended annual dental exams for proper oral hygiene  Community Resource Referral / Chronic Care Management: CRR required this visit?  No   CCM required this visit?  No      Plan:     I have personally reviewed and noted the following in the patient's chart:   Medical and social history Use of alcohol, tobacco or illicit drugs  Current medications and supplements including opioid prescriptions. Patient is not currently taking opioid prescriptions. Functional ability and status Nutritional status Physical activity Advanced directives List of other physicians Hospitalizations, surgeries, and ER visits in previous 12 months Vitals Screenings to include cognitive, depression, and falls Referrals and appointments  In addition, I have reviewed and discussed with patient certain preventive protocols, quality metrics, and best practice recommendations. A written personalized care plan for preventive services as well as general preventive health recommendations were provided to patient.     TWillette Brace LPN   18/18/5631  Nurse Notes: none

## 2022-05-15 ENCOUNTER — Encounter: Payer: Self-pay | Admitting: Internal Medicine

## 2022-05-15 ENCOUNTER — Ambulatory Visit (AMBULATORY_SURGERY_CENTER): Payer: Medicare HMO | Admitting: Internal Medicine

## 2022-05-15 VITALS — BP 172/64 | HR 58 | Temp 97.1°F | Resp 22 | Ht 73.0 in | Wt 268.0 lb

## 2022-05-15 DIAGNOSIS — R932 Abnormal findings on diagnostic imaging of liver and biliary tract: Secondary | ICD-10-CM

## 2022-05-15 DIAGNOSIS — D509 Iron deficiency anemia, unspecified: Secondary | ICD-10-CM | POA: Diagnosis not present

## 2022-05-15 DIAGNOSIS — K31A11 Gastric intestinal metaplasia without dysplasia, involving the antrum: Secondary | ICD-10-CM | POA: Diagnosis not present

## 2022-05-15 DIAGNOSIS — K3189 Other diseases of stomach and duodenum: Secondary | ICD-10-CM | POA: Diagnosis not present

## 2022-05-15 DIAGNOSIS — E119 Type 2 diabetes mellitus without complications: Secondary | ICD-10-CM | POA: Diagnosis not present

## 2022-05-15 DIAGNOSIS — K2970 Gastritis, unspecified, without bleeding: Secondary | ICD-10-CM | POA: Diagnosis not present

## 2022-05-15 MED ORDER — SODIUM CHLORIDE 0.9 % IV SOLN: 500.0000 mL | Freq: Once | INTRAVENOUS | Status: DC

## 2022-05-15 NOTE — Op Note (Signed)
[place] Patient Name: Dennis Wise Procedure Date: 05/15/2022 11:03 AM MRN: [id number] Endoscopist: Gatha Mayer , MD, [id number] Age: 72 Referring MD:  Date of Birth: 1950/09/04 Gender: Male Account #: [id number] Procedure:                Colonoscopy Indications:              Iron deficiency anemia Medicines:                Monitored Anesthesia Care Procedure:                Pre-Anesthesia Assessment:                           - Prior to the procedure, a History and Physical                            was performed, and patient medications and                            allergies were reviewed. The patient's tolerance of                            previous anesthesia was also reviewed. The risks                            and benefits of the procedure and the sedation                            options and risks were discussed with the patient.                            All questions were answered, and informed consent                            was obtained. Prior Anticoagulants: The patient has                            taken no anticoagulant or antiplatelet agents. ASA                            Grade Assessment: II - A patient with mild systemic                            disease. After reviewing the risks and benefits,                            the patient was deemed in satisfactory condition to                            undergo the procedure.                           After obtaining informed consent, the colonoscope  was passed under direct vision. Throughout the                            procedure, the patient's blood pressure, pulse, and                            oxygen saturations were monitored continuously. The                            CF [id number] #[id number] was introduced through the anus                            and advanced to the the cecum, identified by                            appendiceal orifice and ileocecal valve.  The                            colonoscopy was performed without difficulty. The                            patient tolerated the procedure well. The quality                            of the bowel preparation was good. The ileocecal                            valve, appendiceal orifice, and rectum were                            photographed. The bowel preparation used was                            Miralax via split dose instruction. Scope In: 11:20:55 AM Scope Out: 11:38:00 AM Scope Withdrawal Time: 0 hours 14 minutes 59 seconds  Total Procedure Duration: 0 hours 17 minutes 5 seconds  Findings:                 The perianal and digital rectal examinations were                            normal.                           The entire examined colon appeared normal on direct                            and retroflexion views. Complications:            No immediate complications. Estimated Blood Loss:     Estimated blood loss: none. Impression:               - The entire examined colon is normal on direct and  retroflexion views. No cause of iron-deficiency                            anemia - perhaps from gastritis seen on EGD                           - No specimens collected.                           - Personal history of colonic polyps.                           June 10 1310 polyps maximum 7 mm at least 9                            adenomas                           2014 no polyps                           03/10/2020 6 diminutive adenomas Recommendation:           - Patient has a contact number available for                            emergencies. The signs and symptoms of potential                            delayed complications were discussed with the                            patient. Return to normal activities tomorrow.                            Written discharge instructions were provided to the                            patient.                            - Resume previous diet.                           - Continue present medications.                           - No repeat colonoscopy due to age and the absence                            of colonic polyps. Gatha Mayer, MD 05/15/2022 11:48:24 AM This report has been signed electronically.

## 2022-05-15 NOTE — Progress Notes (Signed)
1120 BP  172/71, Labetalol given IV, MD update, vss

## 2022-05-15 NOTE — Progress Notes (Signed)
1101 Robinul 0.1 mg IV given due large amount of secretions upon assessment.  MD made aware, vss 

## 2022-05-15 NOTE — Progress Notes (Signed)
Pt's states no medical or surgical changes since previsit or office visit. VS assessed by D.T

## 2022-05-15 NOTE — Patient Instructions (Addendum)
Upper endoscopy exam showed some stomach inflammation = gastritis. Biopsies taken to understand what may be causing this and to determine treatment, if any. Also took small intestine biopsies to look for causes of possible iron malabsorption.  The colonoscopy was normal - no polyps, no bleeding lesions.  Once I see pathology results I will let you know next steps. Do not plan on any routine repeat colonoscopy testing for you.  I appreciate the opportunity to care for you. Gatha Mayer, MD, [facility]  Handout provided on gastritis.  Resume previous diet.  Continue present medications.  Await pathology results.   YOU HAD AN ENDOSCOPIC PROCEDURE TODAY AT [facility]:   Refer to the procedure report that was given to you for any specific questions about what was found during the examination.  If the procedure report does not answer your questions, please call your gastroenterologist to clarify.  If you requested that your care partner not be given the details of your procedure findings, then the procedure report has been included in a sealed envelope for you to review at your convenience later.  YOU SHOULD EXPECT: Some feelings of bloating in the abdomen. Passage of more gas than usual.  Walking can help get rid of the air that was put into your GI tract during the procedure and reduce the bloating. If you had a lower endoscopy (such as a colonoscopy or flexible sigmoidoscopy) you may notice spotting of blood in your stool or on the toilet paper. If you underwent a bowel prep for your procedure, you may not have a normal bowel movement for a few days.  Please Note:  You might notice some irritation and congestion in your nose or some drainage.  This is from the oxygen used during your procedure.  There is no need for concern and it should clear up in a day or so.  SYMPTOMS TO REPORT IMMEDIATELY:  Following lower endoscopy (colonoscopy or flexible sigmoidoscopy):  Excessive amounts  of blood in the stool  Significant tenderness or worsening of abdominal pains  Swelling of the abdomen that is new, acute  Fever of 100F or higher  Following upper endoscopy (EGD)  Vomiting of blood or coffee ground material  New chest pain or pain under the shoulder blades  Painful or persistently difficult swallowing  New shortness of breath  Fever of 100F or higher  Black, tarry-looking stools  For urgent or emergent issues, a gastroenterologist can be reached at any hour by calling [phone]. Do not use MyChart messaging for urgent concerns.    DIET:  We do recommend a small meal at first, but then you may proceed to your regular diet.  Drink plenty of fluids but you should avoid alcoholic beverages for 24 hours.  ACTIVITY:  You should plan to take it easy for the rest of today and you should NOT DRIVE or use heavy machinery until tomorrow (because of the sedation medicines used during the test).    FOLLOW UP: Our staff will call the number listed on your records the next business day following your procedure.  We will call around 7:15- 8:00 am to check on you and address any questions or concerns that you may have regarding the information given to you following your procedure. If we do not reach you, we will leave a message.     If any biopsies were taken you will be contacted by phone or by letter within the next 1-3 weeks.  Please call us  at [phone] if you have not heard about the biopsies in 3 weeks.    SIGNATURES/CONFIDENTIALITY: You and/or your care partner have signed paperwork which will be entered into your electronic medical record.  These signatures attest to the fact that that the information above on your After Visit Summary has been reviewed and is understood.  Full responsibility of the confidentiality of this discharge information lies with you and/or your care-partner.

## 2022-05-15 NOTE — Op Note (Signed)
[facility] Patient Name: Nasir Bright Procedure Date: 05/15/2022 11:03 AM MRN: [id number] Endoscopist: Gatha Mayer , MD, [id number] Age: 72 Referring MD:  Date of Birth: [birth year] Gender: Male Account #: [id number] Procedure:                Upper GI endoscopy Indications:              Iron deficiency anemia, Abnormal CT of the GI tract Medicines:                Monitored Anesthesia Care Procedure:                Pre-Anesthesia Assessment:                           - Prior to the procedure, a History and Physical                            was performed, and patient medications and                            allergies were reviewed. The patient's tolerance of                            previous anesthesia was also reviewed. The risks                            and benefits of the procedure and the sedation                            options and risks were discussed with the patient.                            All questions were answered, and informed consent                            was obtained. Prior Anticoagulants: The patient has                            taken no anticoagulant or antiplatelet agents. ASA                            Grade Assessment: II - A patient with mild systemic                            disease. After reviewing the risks and benefits,                            the patient was deemed in satisfactory condition to                            undergo the procedure.                           After obtaining informed consent, the endoscope was  passed under direct vision. Throughout the                            procedure, the patient's blood pressure, pulse, and                            oxygen saturations were monitored continuously. The                            GIF [id number] #[id number] was introduced through the                            mouth, and advanced to the second part of duodenum.                            The  upper GI endoscopy was accomplished without                            difficulty. The patient tolerated the procedure                            well. Scope In: Scope Out: Findings:                 Diffuse mild inflammation characterized by                            erosions, erythema and friability was found in the                            gastric antrum. Biopsies were taken with a cold                            forceps for histology. Verification of patient                            identification for the specimen was done. Estimated                            blood loss was minimal.                           The exam was otherwise without abnormality.                           The cardia and gastric fundus were normal on                            retroflexion.                           Biopsies for histology were taken with a cold                            forceps in the entire duodenum for evaluation of  celiac disease. Verification of patient                            identification for the specimen was done. Estimated                            blood loss was minimal. Complications:            No immediate complications. Estimated Blood Loss:     Estimated blood loss was minimal. Impression:               - Gastritis. Biopsied.                           - The examination was otherwise normal. No siggns                            of portal hypetension (has enlarged caudate lobe of                            liver, abnl alk pios and thrombocytopenia)                           - Biopsies were taken with a cold forceps for                            evaluation of celiac disease. Recommendation:           - Patient has a contact number available for                            emergencies. The signs and symptoms of potential                            delayed complications were discussed with the                            patient. Return to normal  activities tomorrow.                            Written discharge instructions were provided to the                            patient.                           - Await pathology results.                           - See the other procedure note for documentation of                            additional recommendations. Gatha Mayer, MD 05/15/2022 11:45:17 AM This report has been signed electronically.

## 2022-05-15 NOTE — Progress Notes (Signed)
Report given to PACU, vss 

## 2022-05-15 NOTE — Progress Notes (Signed)
1110 BP  191/80, Labetalol given IV, MD update, vss

## 2022-05-15 NOTE — Progress Notes (Signed)
[facility] History and Physical   Primary Care Physician:  [name], MD   Reason for Procedure:   Iron deficiency anemia  Plan:    EGd and colonoscopy     HPI: Dennis Wise is a 72 y.o. male with a history of monoclonal B-cell lymphocytosis, pancytopenia evaluated by oncology and hematology in September 2023 and was diagnosed with iron deficiency anemia, iron saturation was 21% with TIBC 342 and ferritin 83.  Looking back ferritin in 04/09/2021 was 83, 67 in May 2023 and 127 in September 23 and 150 in November 2023.  Saturation ratios have been low at 15% and 13% in May and September of this year, TIBC 273 and 363 respectively.  Bone marrow biopsy in February 23 3 showed diminished to absent iron stores.  He has been treated with Venofer by Dr. Tasia Catchings of heme-onc.  There has been loss of weight and he had a CT of the chest abdomen and pelvis on 03/19/2022.  Images reviewed personally.   MPRESSION: 1. Similar and slight improvement in thoracic adenopathy, favoring a benign/reactive etiology. No abdominopelvic adenopathy. 2. Similar to slight decrease in splenic size, upper normal. 3. Nonspecific caudate lobe enlargement. In this patient with a history of thrombocytopenia, recommend clinical correlation for cirrhosis. Consider elastography. 4. Anasarca with decreased trace pelvic fluid. Question fluid overload. 5. Generally improved ground-glass nodularity throughout the lungs. New left lower lobe focal airspace disease and adjacent probable nodular airspace disease. Question minimal developing infection. 6. Cholelithiasis 7. Probable left proximal femur enchondroma, as before. 8. Coronary artery atherosclerosis. Aortic Atherosclerosis (ICD10-I70.0).       Alkaline phosphatase 193 on 03/11/2022 otherwise normal LFTs with AST and ALT 30.  Hemoglobin A1c was 9.2% in August and 7.4% on November 27.   He does not have any heartburn, dysphagia, abdominal pain diarrhea  bleeding constipation or change in bowel habits.  He does not donate blood.  He takes a baby aspirin faithfully but no NSAIDs.  He uses Tylenol as needed otherwise and not much of that.   History of colon polyps last colonoscopy 2021.  Initial colonoscopy and EGD in 2013 because of iron deficiency anemia.  That was noticed after he had a tibial plateau fracture surgery.   The patient acknowledges weight loss but says he was intentionally trying to lose weight and that his hematologist oncologist was mistaken about that with respect to unintentional weight loss.   June 10 1310 polyps maximum 7 mm at least 9 adenomas 2014 no polyps 03/10/2020 6 diminutive adenomas recall planned for 2024   Last EGD was 2013 with erosive gastritis H. pylori positive and was treated.  And a follow-up stool antigen was negative.     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  He reports reduced hearing because of that.   Past Medical History:  Diagnosis Date   Anemia    Iron deficiency part of this   Cataract    bilateral lens implants   Diabetes mellitus    type II   [name] ([place])    GERD (gastroesophageal reflux disease) 70/1779   Helicobacter pylori gastritis 06/11/2011   On EGD 05/2011    Hyperlipidemia    Hypertension 11/1999   Internal hemorrhoids    Iron deficiency anemia, unspecified 03/04/2011   MGUS (monoclonal gammopathy of unknown significance)    possible dx initially, had negative f/u   Pancytopenia, acquired ([place])  Personal history of colonic adenomas 06/05/2012   Pneumonia     Past Surgical History:  Procedure Laterality Date   CATARACT EXTRACTION Bilateral    COLONOSCOPY     ESOPHAGOGASTRODUODENOSCOPY     2012 H. pylori gastritis   FLEXIBLE SIGMOIDOSCOPY  05/1988   internal hemorrhoids   KNEE SURGERY  12/2002   lt knee fx repair ORIF   TIBIA FRACTURE SURGERY     left 2012    Prior to Admission  medications   Medication Sig Start Date End Date Taking? Authorizing Provider  ACCU-CHEK FASTCLIX LANCETS MISC Use to test blood sugar once daily or as needed.  Diagnosis:  E11.3299  Non insulin dependent. 02/22/16  Yes [name], MD  aspirin 81 MG tablet Take 81 mg by mouth daily.   Yes [provider]  atorvastatin (LIPITOR) 10 MG tablet TAKE 1 TABLET AT BEDTIME 02/03/22  Yes [name], MD  blood glucose meter kit and supplies KIT Dispense based on patient and insurance preference. Use up to four times daily as directed. (FOR ICD-9 250.00, 250.01). 11/20/21  Yes Dugal, Tabitha, FNP  Blood Glucose Monitoring Suppl (ACCU-CHEK NANO SMARTVIEW) w/Device KIT Use to check blood sugar once daily or as needed.  Diagnosis: E11.3299  Non insulin dependent. 02/22/16  Yes [name], MD  glimepiride (AMARYL) 2 MG tablet TAKE 1 TABLET EVERY DAY 02/03/22  Yes [name], MD  glucose blood (ACCU-CHEK SMARTVIEW) test strip Use as instructed to test blood sugar once daily or as needed.  Diagnosis:  E11.3299  Non insulin dependent. 03/13/20  Yes [name], MD  metFORMIN (GLUCOPHAGE) 850 MG tablet TAKE 1 TABLET TWICE DAILY 02/03/22  Yes [name], MD  Multiple Vitamin (MULTIVITAMIN) tablet Take 1 tablet by mouth daily. Centrum Silver   Yes [provider]  niacin (NIASPAN) 500 MG CR tablet Take 3 tablets (1,500 mg total) by mouth daily. 08/15/21  Yes [name], MD  pioglitazone (ACTOS) 45 MG tablet TAKE 1 TABLET EVERY DAY 02/03/22  Yes [name], MD  NON FORMULARY Pt take OTC iron 60 mg    [provider]    Current Outpatient Medications  Medication Sig Dispense Refill   ACCU-CHEK FASTCLIX LANCETS MISC Use to test blood sugar once daily or as needed.  Diagnosis:  E11.3299  Non insulin dependent. 102 each 3   aspirin 81 MG tablet Take 81 mg by mouth daily.     atorvastatin (LIPITOR) 10 MG tablet TAKE 1 TABLET AT BEDTIME 90 tablet 10    blood glucose meter kit and supplies KIT Dispense based on patient and insurance preference. Use up to four times daily as directed. (FOR ICD-9 250.00, 250.01). 1 each 0   Blood Glucose Monitoring Suppl (ACCU-CHEK NANO SMARTVIEW) w/Device KIT Use to check blood sugar once daily or as needed.  Diagnosis: E11.3299  Non insulin dependent. 1 kit 0   glimepiride (AMARYL) 2 MG tablet TAKE 1 TABLET EVERY DAY 90 tablet 10   glucose blood (ACCU-CHEK SMARTVIEW) test strip Use as instructed to test blood sugar once daily or as needed.  Diagnosis:  E11.3299  Non insulin dependent. 100 each 3   metFORMIN (GLUCOPHAGE) 850 MG tablet TAKE 1 TABLET TWICE DAILY 180 tablet 10   Multiple Vitamin (MULTIVITAMIN) tablet Take 1 tablet by mouth daily. Centrum Silver     niacin (NIASPAN) 500 MG CR tablet Take 3 tablets (1,500 mg total) by mouth daily.  pioglitazone (ACTOS) 45 MG tablet TAKE 1 TABLET EVERY DAY 90 tablet 10   NON FORMULARY Pt take OTC iron 60 mg     Current Facility-Administered Medications  Medication Dose Route Frequency Provider Last Rate Last Admin   0.9 %  sodium chloride infusion  500 mL Intravenous Once Gatha Mayer, MD        Allergies as of 05/15/2022 - Review Complete 05/15/2022  Allergen Reaction Noted   Promethazine hcl  09/06/2007    Family History  Problem Relation Age of Onset   Cancer Mother        ?   Diabetes Mother    Heart failure Mother    Emphysema Father    Cancer Father        ?   Alzheimer's disease Father    Skin cancer Brother    Diabetes Brother    Colon cancer Neg Hx    Prostate cancer Neg Hx    Esophageal cancer Neg Hx    Rectal cancer Neg Hx    Stomach cancer Neg Hx     Social History   Socioeconomic History   Marital status: Married    Spouse name: Not on file   Number of children: Not on file   Years of education: Not on file   Highest education level: Not on file  Occupational History   Not on file  Tobacco Use   Smoking status: Never    Smokeless tobacco: Never  Vaping Use   Vaping Use: Never used  Substance and Sexual Activity   Alcohol use: Never   Drug use: No   Sexual activity: Yes  Other Topics Concern   Not on file  Social History Narrative   Prev traveling for sports broadcasting    Working for [employer], [employer] etc as of 2019   Married 1970   1 child   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   Social Determinants of Health   Financial Resource Strain: Low Risk  (05/13/2022)   Overall Financial Resource Strain (CARDIA)    Difficulty of Paying Living Expenses: Not hard at all  Food Insecurity: No Food Insecurity (05/13/2022)   Hunger Vital Sign    Worried About Running Out of Food in the Last Year: Never true    Ran Out of Food in the Last Year: Never true  Transportation Needs: No Transportation Needs (05/13/2022)   PRAPARE - Hydrologist (Medical): No    Lack of Transportation (Non-Medical): No  Physical Activity: Sufficiently Active (05/13/2022)   Exercise Vital Sign    Days of Exercise per Week: 7 days    Minutes of Exercise per Session: 30 min  Stress: No Stress Concern Present (05/13/2022)   [place]    Feeling of Stress : Not at all  Social Connections: Moderately Isolated (05/13/2022)   Social Connection and Isolation Panel [NHANES]    Frequency of Communication with Friends and Family: More than three times a week    Frequency of Social Gatherings with Friends and Family: More than three times a week    Attends Religious Services: Never    Marine scientist or Organizations: No    Attends Archivist Meetings: Never    Marital Status: Married  Human resources officer Violence: Not At Risk (05/13/2022)   Humiliation, Afraid, Rape, and Kick questionnaire    Fear of Current or Ex-Partner: No    Emotionally Abused: No  Physically Abused: No    Sexually Abused: No    Review of Systems:  All other review  of systems negative except as mentioned in the HPI.  Physical Exam: Vital signs BP (!) 191/71   Pulse (!) 55   Temp (!) 97.1 F (36.2 C) (Skin)   Ht '6\' 1"'$  (1.854 m)   Wt 268 lb (121.6 kg)   SpO2 97%   BMI 35.36 kg/m   General:   Alert,  Well-developed, well-nourished, pleasant and cooperative in NAD Lungs:  Clear throughout to auscultation.   Heart:  Regular rate and rhythm; no murmurs, clicks, rubs,  or gallops. Abdomen:  Soft, nontender and nondistended. Normal bowel sounds.   Neuro/Psych:  Alert and cooperative. Normal mood and affect. A and O x 3   '@Kriss Perleberg'$  Simonne Maffucci, MD, [facility] [phone] (pager) 05/15/2022 10:55 AM@

## 2022-05-16 ENCOUNTER — Telehealth: Payer: Self-pay | Admitting: *Deleted

## 2022-05-16 NOTE — Telephone Encounter (Signed)
  Follow up Call-     05/15/2022    9:55 AM 03/06/2020    8:55 AM  Call back number  Post procedure Call Back phone  # [phone]  Permission to leave phone message Yes Yes     Patient questions:  Do you have a fever, pain , or abdominal swelling? No. Pain Score  0 *  Have you tolerated food without any problems? Yes.    Have you been able to return to your normal activities? Yes.    Do you have any questions about your discharge instructions: Diet   No. Medications  No. Follow up visit  No.  Do you have questions or concerns about your Care? No.  Actions: * If pain score is 4 or above: No action needed, pain <4.

## 2022-05-27 ENCOUNTER — Other Ambulatory Visit: Payer: Self-pay | Admitting: Internal Medicine

## 2022-05-27 MED ORDER — OMEPRAZOLE 20 MG PO CPDR: 20.0000 mg | DELAYED_RELEASE_CAPSULE | Freq: Every day | ORAL | 0 refills | Status: DC

## 2022-05-28 ENCOUNTER — Other Ambulatory Visit: Payer: Self-pay

## 2022-05-28 DIAGNOSIS — R748 Abnormal levels of other serum enzymes: Secondary | ICD-10-CM

## 2022-06-26 ENCOUNTER — Ambulatory Visit: Payer: Medicare HMO | Admitting: Family Medicine

## 2022-06-30 ENCOUNTER — Ambulatory Visit (INDEPENDENT_AMBULATORY_CARE_PROVIDER_SITE_OTHER): Payer: Medicare HMO | Admitting: Family Medicine

## 2022-06-30 ENCOUNTER — Encounter: Payer: Self-pay | Admitting: Family Medicine

## 2022-06-30 VITALS — BP 150/72 | HR 55 | Temp 97.2°F | Ht 73.0 in | Wt 261.0 lb

## 2022-06-30 DIAGNOSIS — K219 Gastro-esophageal reflux disease without esophagitis: Secondary | ICD-10-CM

## 2022-06-30 DIAGNOSIS — M25519 Pain in unspecified shoulder: Secondary | ICD-10-CM | POA: Diagnosis not present

## 2022-06-30 DIAGNOSIS — E113299 Type 2 diabetes mellitus with mild nonproliferative diabetic retinopathy without macular edema, unspecified eye: Secondary | ICD-10-CM

## 2022-06-30 LAB — POCT GLYCOSYLATED HEMOGLOBIN (HGB A1C): Hemoglobin A1C: 7.8 % — AB (ref 4.0–5.6)

## 2022-06-30 NOTE — Patient Instructions (Addendum)
Recheck labs before a yealy visit around November.  Take care.  Glad to see you. Update me as needed in the meantime.  Don't change your meds for now.

## 2022-06-30 NOTE — Progress Notes (Unsigned)
Diabetes:  Using medications without difficulties: yes Hypoglycemic episodes: no Hyperglycemic episodes: no Feet problems: L foot run over by a floor sweeper yesterday.  Nail is still attached.  Discussed footcare and leaving the nail in place if possible. Blood Sugars averaging: not checked often eye exam within last year: d/w pt.  Has f/u pending.    L shoulder pain with pain on int and ext rotation.   D/w pt about HEP for shoulder.  Handout given and explained.  H/o anemia, currently on PPI after EGD/colonoscopy with hematology f/u pending.  No known GI bleeding.   BP has been lower on home check, d/w pt.    Meds, vitals, and allergies reviewed.   ROS: Per HPI unless specifically indicated in ROS section   GEN: nad, alert and oriented HEENT: ncat CV: rrr. PULM: ctab, no inc wob ABD: soft, +bs EXT: no edema SKIN: well perfused.  Left shoulder pain with internal/external rotation.  Distally neurovascular intact on gross examL  L 3rd toenail is loose w/o bruising.  Toe appears intact

## 2022-07-02 DIAGNOSIS — D0461 Carcinoma in situ of skin of right upper limb, including shoulder: Secondary | ICD-10-CM | POA: Diagnosis not present

## 2022-07-02 DIAGNOSIS — Z08 Encounter for follow-up examination after completed treatment for malignant neoplasm: Secondary | ICD-10-CM | POA: Diagnosis not present

## 2022-07-02 DIAGNOSIS — D2271 Melanocytic nevi of right lower limb, including hip: Secondary | ICD-10-CM | POA: Diagnosis not present

## 2022-07-02 DIAGNOSIS — D225 Melanocytic nevi of trunk: Secondary | ICD-10-CM | POA: Diagnosis not present

## 2022-07-02 DIAGNOSIS — M25519 Pain in unspecified shoulder: Secondary | ICD-10-CM

## 2022-07-02 DIAGNOSIS — D2272 Melanocytic nevi of left lower limb, including hip: Secondary | ICD-10-CM | POA: Diagnosis not present

## 2022-07-02 DIAGNOSIS — D2261 Melanocytic nevi of right upper limb, including shoulder: Secondary | ICD-10-CM | POA: Diagnosis not present

## 2022-07-02 DIAGNOSIS — D485 Neoplasm of uncertain behavior of skin: Secondary | ICD-10-CM | POA: Diagnosis not present

## 2022-07-02 DIAGNOSIS — D2262 Melanocytic nevi of left upper limb, including shoulder: Secondary | ICD-10-CM | POA: Diagnosis not present

## 2022-07-02 DIAGNOSIS — L821 Other seborrheic keratosis: Secondary | ICD-10-CM | POA: Diagnosis not present

## 2022-07-02 DIAGNOSIS — L570 Actinic keratosis: Secondary | ICD-10-CM | POA: Diagnosis not present

## 2022-07-02 DIAGNOSIS — L820 Inflamed seborrheic keratosis: Secondary | ICD-10-CM | POA: Diagnosis not present

## 2022-07-02 DIAGNOSIS — Z85828 Personal history of other malignant neoplasm of skin: Secondary | ICD-10-CM | POA: Diagnosis not present

## 2022-07-02 HISTORY — DX: Pain in unspecified shoulder: M25.519

## 2022-07-02 NOTE — Assessment & Plan Note (Signed)
H/o anemia, currently on PPI after EGD/colonoscopy with hematology f/u pending.  No known GI bleeding.

## 2022-07-02 NOTE — Assessment & Plan Note (Signed)
Footcare discussed with patient.  He is going to try to leave the nail in place and then trim it as it grows out.  He will update me as needed.  A1c 7.8.  Goal to limit hypoglycemia.  I would continue work on diet and exercise.  Continue glimepiride metformin and Actos.  Recheck periodically.  See after visit summary.

## 2022-07-02 NOTE — Assessment & Plan Note (Signed)
L shoulder pain with pain on int and ext rotation.   D/w pt about HEP for shoulder.  Handout given and explained.  He can update me as needed.

## 2022-07-21 DIAGNOSIS — I10 Essential (primary) hypertension: Secondary | ICD-10-CM | POA: Diagnosis not present

## 2022-07-21 DIAGNOSIS — R001 Bradycardia, unspecified: Secondary | ICD-10-CM | POA: Diagnosis not present

## 2022-07-21 DIAGNOSIS — E782 Mixed hyperlipidemia: Secondary | ICD-10-CM | POA: Diagnosis not present

## 2022-07-21 DIAGNOSIS — I071 Rheumatic tricuspid insufficiency: Secondary | ICD-10-CM | POA: Diagnosis not present

## 2022-07-21 DIAGNOSIS — I6523 Occlusion and stenosis of bilateral carotid arteries: Secondary | ICD-10-CM | POA: Diagnosis not present

## 2022-07-23 ENCOUNTER — Other Ambulatory Visit (INDEPENDENT_AMBULATORY_CARE_PROVIDER_SITE_OTHER): Payer: Medicare HMO

## 2022-07-23 DIAGNOSIS — R748 Abnormal levels of other serum enzymes: Secondary | ICD-10-CM

## 2022-07-23 LAB — HEPATIC FUNCTION PANEL
ALT: 14 U/L (ref 0–53)
AST: 19 U/L (ref 0–37)
Albumin: 4.1 g/dL (ref 3.5–5.2)
Alkaline Phosphatase: 143 U/L — ABNORMAL HIGH (ref 39–117)
Bilirubin, Direct: 0.2 mg/dL (ref 0.0–0.3)
Total Bilirubin: 0.6 mg/dL (ref 0.2–1.2)
Total Protein: 7 g/dL (ref 6.0–8.3)

## 2022-07-23 LAB — GAMMA GT: GGT: 53 U/L — ABNORMAL HIGH (ref 7–51)

## 2022-07-30 DIAGNOSIS — D0461 Carcinoma in situ of skin of right upper limb, including shoulder: Secondary | ICD-10-CM | POA: Diagnosis not present

## 2022-07-31 ENCOUNTER — Inpatient Hospital Stay: Payer: Medicare HMO | Attending: Oncology

## 2022-07-31 DIAGNOSIS — K219 Gastro-esophageal reflux disease without esophagitis: Secondary | ICD-10-CM | POA: Insufficient documentation

## 2022-07-31 DIAGNOSIS — E669 Obesity, unspecified: Secondary | ICD-10-CM | POA: Diagnosis not present

## 2022-07-31 DIAGNOSIS — D696 Thrombocytopenia, unspecified: Secondary | ICD-10-CM | POA: Insufficient documentation

## 2022-07-31 DIAGNOSIS — R5383 Other fatigue: Secondary | ICD-10-CM | POA: Diagnosis not present

## 2022-07-31 DIAGNOSIS — Z79899 Other long term (current) drug therapy: Secondary | ICD-10-CM | POA: Diagnosis not present

## 2022-07-31 DIAGNOSIS — R590 Localized enlarged lymph nodes: Secondary | ICD-10-CM | POA: Insufficient documentation

## 2022-07-31 DIAGNOSIS — Z808 Family history of malignant neoplasm of other organs or systems: Secondary | ICD-10-CM | POA: Diagnosis not present

## 2022-07-31 DIAGNOSIS — Z818 Family history of other mental and behavioral disorders: Secondary | ICD-10-CM | POA: Insufficient documentation

## 2022-07-31 DIAGNOSIS — E119 Type 2 diabetes mellitus without complications: Secondary | ICD-10-CM | POA: Insufficient documentation

## 2022-07-31 DIAGNOSIS — I10 Essential (primary) hypertension: Secondary | ICD-10-CM | POA: Diagnosis not present

## 2022-07-31 DIAGNOSIS — Z8719 Personal history of other diseases of the digestive system: Secondary | ICD-10-CM | POA: Diagnosis not present

## 2022-07-31 DIAGNOSIS — R350 Frequency of micturition: Secondary | ICD-10-CM | POA: Insufficient documentation

## 2022-07-31 DIAGNOSIS — D509 Iron deficiency anemia, unspecified: Secondary | ICD-10-CM | POA: Insufficient documentation

## 2022-07-31 DIAGNOSIS — Z809 Family history of malignant neoplasm, unspecified: Secondary | ICD-10-CM | POA: Insufficient documentation

## 2022-07-31 DIAGNOSIS — Z825 Family history of asthma and other chronic lower respiratory diseases: Secondary | ICD-10-CM | POA: Diagnosis not present

## 2022-07-31 DIAGNOSIS — Z833 Family history of diabetes mellitus: Secondary | ICD-10-CM | POA: Insufficient documentation

## 2022-07-31 DIAGNOSIS — R161 Splenomegaly, not elsewhere classified: Secondary | ICD-10-CM | POA: Insufficient documentation

## 2022-07-31 DIAGNOSIS — D508 Other iron deficiency anemias: Secondary | ICD-10-CM

## 2022-07-31 LAB — CBC WITH DIFFERENTIAL/PLATELET
Abs Immature Granulocytes: 0.01 10*3/uL (ref 0.00–0.07)
Basophils Absolute: 0 10*3/uL (ref 0.0–0.1)
Basophils Relative: 1 %
Eosinophils Absolute: 0.1 10*3/uL (ref 0.0–0.5)
Eosinophils Relative: 2 %
HCT: 35 % — ABNORMAL LOW (ref 39.0–52.0)
Hemoglobin: 11.6 g/dL — ABNORMAL LOW (ref 13.0–17.0)
Immature Granulocytes: 0 %
Lymphocytes Relative: 22 %
Lymphs Abs: 0.9 10*3/uL (ref 0.7–4.0)
MCH: 30 pg (ref 26.0–34.0)
MCHC: 33.1 g/dL (ref 30.0–36.0)
MCV: 90.4 fL (ref 80.0–100.0)
Monocytes Absolute: 0.4 10*3/uL (ref 0.1–1.0)
Monocytes Relative: 10 %
Neutro Abs: 2.8 10*3/uL (ref 1.7–7.7)
Neutrophils Relative %: 65 %
Platelets: 89 10*3/uL — ABNORMAL LOW (ref 150–400)
RBC: 3.87 MIL/uL — ABNORMAL LOW (ref 4.22–5.81)
RDW: 15.4 % (ref 11.5–15.5)
WBC: 4.2 10*3/uL (ref 4.0–10.5)
nRBC: 0 % (ref 0.0–0.2)

## 2022-07-31 LAB — COMPREHENSIVE METABOLIC PANEL
ALT: 16 U/L (ref 0–44)
AST: 22 U/L (ref 15–41)
Albumin: 3.7 g/dL (ref 3.5–5.0)
Alkaline Phosphatase: 129 U/L — ABNORMAL HIGH (ref 38–126)
Anion gap: 9 (ref 5–15)
BUN: 27 mg/dL — ABNORMAL HIGH (ref 8–23)
CO2: 27 mmol/L (ref 22–32)
Calcium: 9 mg/dL (ref 8.9–10.3)
Chloride: 100 mmol/L (ref 98–111)
Creatinine, Ser: 1.27 mg/dL — ABNORMAL HIGH (ref 0.61–1.24)
GFR, Estimated: 59 mL/min — ABNORMAL LOW (ref >60)
Glucose, Bld: 212 mg/dL — ABNORMAL HIGH (ref 70–99)
Potassium: 4.3 mmol/L (ref 3.5–5.1)
Sodium: 136 mmol/L (ref 135–145)
Total Bilirubin: 0.7 mg/dL (ref 0.3–1.2)
Total Protein: 6.7 g/dL (ref 6.5–8.1)

## 2022-07-31 LAB — IRON AND TIBC
Iron: 66 ug/dL (ref 45–182)
Saturation Ratios: 19 % (ref 17.9–39.5)
TIBC: 353 ug/dL (ref 250–450)
UIBC: 287 ug/dL

## 2022-07-31 LAB — FERRITIN: Ferritin: 154 ng/mL (ref 24–336)

## 2022-07-31 LAB — IMMATURE PLATELET FRACTION: Immature Platelet Fraction: 2.6 % (ref 1.2–8.6)

## 2022-08-04 ENCOUNTER — Encounter: Payer: Self-pay | Admitting: Oncology

## 2022-08-04 ENCOUNTER — Inpatient Hospital Stay: Payer: Medicare HMO | Admitting: Oncology

## 2022-08-04 VITALS — BP 185/60 | HR 57 | Temp 96.0°F | Wt 264.2 lb

## 2022-08-04 DIAGNOSIS — R161 Splenomegaly, not elsewhere classified: Secondary | ICD-10-CM | POA: Diagnosis not present

## 2022-08-04 DIAGNOSIS — D509 Iron deficiency anemia, unspecified: Secondary | ICD-10-CM | POA: Diagnosis not present

## 2022-08-04 DIAGNOSIS — R350 Frequency of micturition: Secondary | ICD-10-CM | POA: Diagnosis not present

## 2022-08-04 DIAGNOSIS — D696 Thrombocytopenia, unspecified: Secondary | ICD-10-CM | POA: Diagnosis not present

## 2022-08-04 DIAGNOSIS — E119 Type 2 diabetes mellitus without complications: Secondary | ICD-10-CM | POA: Diagnosis not present

## 2022-08-04 DIAGNOSIS — D508 Other iron deficiency anemias: Secondary | ICD-10-CM

## 2022-08-04 DIAGNOSIS — K219 Gastro-esophageal reflux disease without esophagitis: Secondary | ICD-10-CM | POA: Diagnosis not present

## 2022-08-04 DIAGNOSIS — R5383 Other fatigue: Secondary | ICD-10-CM | POA: Diagnosis not present

## 2022-08-04 DIAGNOSIS — R590 Localized enlarged lymph nodes: Secondary | ICD-10-CM | POA: Diagnosis not present

## 2022-08-04 DIAGNOSIS — D72820 Lymphocytosis (symptomatic): Secondary | ICD-10-CM

## 2022-08-04 DIAGNOSIS — I10 Essential (primary) hypertension: Secondary | ICD-10-CM | POA: Diagnosis not present
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
## 2022-08-04 NOTE — Assessment & Plan Note (Signed)
Labs reviewed and discussed with patient. Iron panel and hemoglobin level are stabel I will hold off additional IV Venofer treatment at this point.

## 2022-08-04 NOTE — Assessment & Plan Note (Addendum)
Splenomegaly and Prominent/mildly enlarged mediastinal, hilar and retroperitoneal lymph nodes CT chest abdomen pelvis w contrast reviewed, .improved thoracic adenopathy and spleen size.  Continue observation.

## 2022-08-04 NOTE — Assessment & Plan Note (Signed)
Due to splenomegaly. Stable counts

## 2022-08-05 ENCOUNTER — Other Ambulatory Visit: Payer: Medicare HMO

## 2022-08-05 ENCOUNTER — Encounter: Payer: Self-pay | Admitting: Internal Medicine

## 2022-08-05 ENCOUNTER — Ambulatory Visit: Payer: Medicare HMO | Admitting: Internal Medicine

## 2022-08-05 VITALS — BP 130/72 | HR 67 | Ht 73.0 in | Wt 263.0 lb

## 2022-08-05 DIAGNOSIS — K31A0 Gastric intestinal metaplasia, unspecified: Secondary | ICD-10-CM

## 2022-08-05 DIAGNOSIS — D509 Iron deficiency anemia, unspecified: Secondary | ICD-10-CM

## 2022-08-05 DIAGNOSIS — K2970 Gastritis, unspecified, without bleeding: Secondary | ICD-10-CM | POA: Diagnosis not present

## 2022-08-05 DIAGNOSIS — R748 Abnormal levels of other serum enzymes: Secondary | ICD-10-CM | POA: Diagnosis not present

## 2022-08-05 DIAGNOSIS — R932 Abnormal findings on diagnostic imaging of liver and biliary tract: Secondary | ICD-10-CM | POA: Diagnosis not present

## 2022-08-05 NOTE — Patient Instructions (Addendum)
_______________________________________________________  If your blood pressure at your visit was 140/90 or greater, please contact your primary care physician to follow up on this.  _______________________________________________________  If you are age 74 or older, your body mass index should be between 23-30. Your Body mass index is 34.7 kg/m. If this is out of the aforementioned range listed, please consider follow up with your Primary Care Provider.  If you are age 25 or younger, your body mass index should be between 19-25. Your Body mass index is 34.7 kg/m. If this is out of the aformentioned range listed, please consider follow up with your Primary Care Provider.   ________________________________________________________  The [place] [facility] providers would like to encourage you to use [facility] to communicate with providers for non-urgent requests or questions.  Due to long hold times on the telephone, sending your provider a message by [name] may be a faster and more efficient way to get a response.  Please allow 48 business hours for a response.  Please remember that this is for non-urgent requests.  _______________________________________________________  Your provider has requested that you go to the basement level for lab work before leaving today. Press "B" on the elevator. The lab is located at the first door on the left as you exit the elevator.   You have been scheduled for an abdominal ultrasound at [facility] (1st floor of hospital) on 08-12-2022 at 10am. Please arrive 30 minutes prior to your appointment for registration. Make certain not to have anything to eat or drink after midnight before your appointment. Should you need to reschedule your appointment, please contact radiology at [phone]. This test typically takes about 30 minutes to perform.   Due to recent changes in healthcare laws, you may see the results of your imaging and laboratory studies on MyChart  before your provider has had a chance to review them.  We understand that in some cases there may be results that are confusing or concerning to you. Not all laboratory results come back in the same time frame and the provider may be waiting for multiple results in order to interpret others.  Please give [place] 48 hours in order for your provider to thoroughly review all the results before contacting the office for clarification of your results.   It was a pleasure to see you today!  Thank you for trusting me with your gastrointestinal care!
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
## 2022-08-06 LAB — HEPATITIS B SURFACE ANTIGEN: Hepatitis B Surface Ag: NONREACTIVE

## 2022-08-08 LAB — HEPATITIS B CORE ANTIBODY, TOTAL: Hep B Core Total Ab: NONREACTIVE

## 2022-08-08 LAB — HEPATITIS B SURFACE ANTIBODY,QUALITATIVE: Hep B S Ab: NONREACTIVE

## 2022-08-08 LAB — HEPATITIS A ANTIBODY, TOTAL: Hepatitis A AB,Total: NONREACTIVE

## 2022-08-11 LAB — ANTI-SMOOTH MUSCLE ANTIBODY, IGG: Actin (Smooth Muscle) Antibody (IGG): <20 U (ref <20)

## 2022-08-12 ENCOUNTER — Ambulatory Visit (HOSPITAL_COMMUNITY)
Admission: RE | Admit: 2022-08-12 | Discharge: 2022-08-12 | Disposition: A | Discharge status: Home / Self Care | Payer: Medicare HMO | Source: Ambulatory Visit | Attending: Internal Medicine | Admitting: Internal Medicine

## 2022-08-12 DIAGNOSIS — K31A0 Gastric intestinal metaplasia, unspecified: Secondary | ICD-10-CM

## 2022-08-12 DIAGNOSIS — R932 Abnormal findings on diagnostic imaging of liver and biliary tract: Secondary | ICD-10-CM | POA: Insufficient documentation

## 2022-08-12 DIAGNOSIS — D509 Iron deficiency anemia, unspecified: Secondary | ICD-10-CM | POA: Diagnosis not present

## 2022-08-12 DIAGNOSIS — K2970 Gastritis, unspecified, without bleeding: Secondary | ICD-10-CM | POA: Diagnosis not present

## 2022-08-12 DIAGNOSIS — Z0389 Encounter for observation for other suspected diseases and conditions ruled out: Secondary | ICD-10-CM | POA: Diagnosis not present

## 2022-08-12 DIAGNOSIS — R748 Abnormal levels of other serum enzymes: Secondary | ICD-10-CM

## 2022-08-13 LAB — MITOCHONDRIAL ANTIBODIES: Mitochondrial M2 Ab, IgG: <=20 U (ref <=20.0)

## 2022-08-13 LAB — ALPHA-1 ANTITRYPSIN PHENOTYPE: A-1 Antitrypsin, Ser: 156 mg/dL (ref 83–199)

## 2022-08-20 DIAGNOSIS — H40003 Preglaucoma, unspecified, bilateral: Secondary | ICD-10-CM | POA: Diagnosis not present

## 2022-08-20 DIAGNOSIS — Z961 Presence of intraocular lens: Secondary | ICD-10-CM | POA: Diagnosis not present

## 2022-08-20 DIAGNOSIS — E113513 Type 2 diabetes mellitus with proliferative diabetic retinopathy with macular edema, bilateral: Secondary | ICD-10-CM | POA: Diagnosis not present

## 2022-08-20 DIAGNOSIS — H02831 Dermatochalasis of right upper eyelid: Secondary | ICD-10-CM | POA: Diagnosis not present

## 2022-08-20 DIAGNOSIS — H53462 Homonymous bilateral field defects, left side: Secondary | ICD-10-CM | POA: Insufficient documentation

## 2022-08-20 DIAGNOSIS — H02834 Dermatochalasis of left upper eyelid: Secondary | ICD-10-CM | POA: Diagnosis not present

## 2022-08-20 DIAGNOSIS — Z7984 Long term (current) use of oral hypoglycemic drugs: Secondary | ICD-10-CM | POA: Diagnosis not present

## 2022-08-20 HISTORY — DX: Homonymous bilateral field defects, left side: H53.462

## 2022-09-01 ENCOUNTER — Encounter: Payer: Self-pay | Admitting: Family Medicine

## 2022-09-01 ENCOUNTER — Ambulatory Visit (INDEPENDENT_AMBULATORY_CARE_PROVIDER_SITE_OTHER): Payer: Medicare HMO | Admitting: Family Medicine

## 2022-09-01 VITALS — BP 138/74 | HR 62 | Temp 98.0°F | Ht 73.0 in | Wt 255.0 lb

## 2022-09-01 DIAGNOSIS — R413 Other amnesia: Secondary | ICD-10-CM

## 2022-09-01 LAB — TSH: TSH: 3.23 u[IU]/mL (ref 0.35–5.50)

## 2022-09-01 LAB — VITAMIN B12: Vitamin B-12: 430 pg/mL (ref 211–911)
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
## 2022-09-01 NOTE — Patient Instructions (Signed)
Go to the lab on the way out.   If you have mychart we'll likely use that to update you.    Take care.  Glad to see you. Let me know if you don't get a call about the head CT.  I wouldn't drive or work in the meantime.

## 2022-09-03 ENCOUNTER — Other Ambulatory Visit: Payer: Self-pay | Admitting: Family Medicine

## 2022-09-03 DIAGNOSIS — R413 Other amnesia: Secondary | ICD-10-CM | POA: Insufficient documentation

## 2022-09-03 NOTE — Assessment & Plan Note (Signed)
With difficulty processing information compared to his previous baseline.  See above.  I advised him not to drive temporarily.  I think it makes sense for him to be out of work temporarily.  He has movement changes with tremor.  Insomnia noted.  I think he needs neurology evaluation, head CT and labs.  We talked about the differential which is broad and he needs workup.  Rationale discussed with patient and he agrees to proceed.  30 minutes were devoted to patient care in this encounter (this includes time spent reviewing the patient's file/history, interviewing and examining the patient, counseling/reviewing plan with patient).

## 2022-09-04 ENCOUNTER — Other Ambulatory Visit: Payer: Self-pay | Admitting: Family Medicine

## 2022-09-04 ENCOUNTER — Encounter: Payer: Self-pay | Admitting: *Deleted

## 2022-09-04 DIAGNOSIS — R413 Other amnesia: Secondary | ICD-10-CM

## 2022-09-05 ENCOUNTER — Telehealth: Payer: Self-pay

## 2022-09-05 ENCOUNTER — Other Ambulatory Visit: Payer: Self-pay

## 2022-09-05 ENCOUNTER — Emergency Department (HOSPITAL_COMMUNITY): Payer: Medicare HMO

## 2022-09-05 ENCOUNTER — Ambulatory Visit
Admission: RE | Admit: 2022-09-05 | Discharge: 2022-09-05 | Disposition: A | Discharge status: Home / Self Care | Payer: Medicare HMO | Source: Ambulatory Visit | Attending: Family Medicine | Admitting: Family Medicine

## 2022-09-05 ENCOUNTER — Observation Stay (HOSPITAL_COMMUNITY)
Admission: EM | Admit: 2022-09-05 | Discharge: 2022-09-07 | Disposition: A | Discharge status: Home / Self Care | Payer: Medicare HMO | Attending: Internal Medicine | Admitting: Internal Medicine

## 2022-09-05 ENCOUNTER — Encounter (HOSPITAL_COMMUNITY): Payer: Self-pay | Admitting: Internal Medicine

## 2022-09-05 DIAGNOSIS — I63531 Cerebral infarction due to unspecified occlusion or stenosis of right posterior cerebral artery: Secondary | ICD-10-CM | POA: Insufficient documentation

## 2022-09-05 DIAGNOSIS — E113299 Type 2 diabetes mellitus with mild nonproliferative diabetic retinopathy without macular edema, unspecified eye: Secondary | ICD-10-CM

## 2022-09-05 DIAGNOSIS — R2681 Unsteadiness on feet: Secondary | ICD-10-CM | POA: Diagnosis not present

## 2022-09-05 DIAGNOSIS — I6389 Other cerebral infarction: Secondary | ICD-10-CM | POA: Diagnosis not present

## 2022-09-05 DIAGNOSIS — R296 Repeated falls: Secondary | ICD-10-CM | POA: Diagnosis not present

## 2022-09-05 DIAGNOSIS — I10 Essential (primary) hypertension: Secondary | ICD-10-CM | POA: Diagnosis not present

## 2022-09-05 DIAGNOSIS — Z7982 Long term (current) use of aspirin: Secondary | ICD-10-CM | POA: Insufficient documentation

## 2022-09-05 DIAGNOSIS — D696 Thrombocytopenia, unspecified: Secondary | ICD-10-CM | POA: Diagnosis not present

## 2022-09-05 DIAGNOSIS — Z79899 Other long term (current) drug therapy: Secondary | ICD-10-CM | POA: Diagnosis not present

## 2022-09-05 DIAGNOSIS — R269 Unspecified abnormalities of gait and mobility: Secondary | ICD-10-CM | POA: Diagnosis not present

## 2022-09-05 DIAGNOSIS — D649 Anemia, unspecified: Secondary | ICD-10-CM | POA: Diagnosis not present

## 2022-09-05 DIAGNOSIS — I129 Hypertensive chronic kidney disease with stage 1 through stage 4 chronic kidney disease, or unspecified chronic kidney disease: Secondary | ICD-10-CM | POA: Diagnosis not present

## 2022-09-05 DIAGNOSIS — R413 Other amnesia: Secondary | ICD-10-CM | POA: Diagnosis not present

## 2022-09-05 DIAGNOSIS — Z7984 Long term (current) use of oral hypoglycemic drugs: Secondary | ICD-10-CM | POA: Diagnosis not present

## 2022-09-05 DIAGNOSIS — R41841 Cognitive communication deficit: Secondary | ICD-10-CM | POA: Insufficient documentation

## 2022-09-05 DIAGNOSIS — I63431 Cerebral infarction due to embolism of right posterior cerebral artery: Principal | ICD-10-CM

## 2022-09-05 DIAGNOSIS — R262 Difficulty in walking, not elsewhere classified: Secondary | ICD-10-CM | POA: Diagnosis not present

## 2022-09-05 DIAGNOSIS — E1165 Type 2 diabetes mellitus with hyperglycemia: Secondary | ICD-10-CM | POA: Diagnosis not present

## 2022-09-05 DIAGNOSIS — E871 Hypo-osmolality and hyponatremia: Secondary | ICD-10-CM | POA: Insufficient documentation

## 2022-09-05 DIAGNOSIS — I639 Cerebral infarction, unspecified: Secondary | ICD-10-CM | POA: Diagnosis present

## 2022-09-05 DIAGNOSIS — N183 Chronic kidney disease, stage 3 unspecified: Secondary | ICD-10-CM | POA: Diagnosis not present

## 2022-09-05 DIAGNOSIS — R519 Headache, unspecified: Secondary | ICD-10-CM | POA: Diagnosis present

## 2022-09-05 DIAGNOSIS — M6281 Muscle weakness (generalized): Secondary | ICD-10-CM | POA: Insufficient documentation

## 2022-09-05 HISTORY — DX: Cerebral infarction due to unspecified occlusion or stenosis of right posterior cerebral artery: I63.531

## 2022-09-05 LAB — FOLATE: Folate: 19.8 ng/mL (ref >5.9)

## 2022-09-05 LAB — COMPREHENSIVE METABOLIC PANEL
ALT: 18 U/L (ref 0–44)
AST: 24 U/L (ref 15–41)
Albumin: 3.6 g/dL (ref 3.5–5.0)
Alkaline Phosphatase: 204 U/L — ABNORMAL HIGH (ref 38–126)
Anion gap: 11 (ref 5–15)
BUN: 24 mg/dL — ABNORMAL HIGH (ref 8–23)
CO2: 23 mmol/L (ref 22–32)
Calcium: 9.1 mg/dL (ref 8.9–10.3)
Chloride: 98 mmol/L (ref 98–111)
Creatinine, Ser: 1.36 mg/dL — ABNORMAL HIGH (ref 0.61–1.24)
GFR, Estimated: 55 mL/min — ABNORMAL LOW (ref >60)
Glucose, Bld: 233 mg/dL — ABNORMAL HIGH (ref 70–99)
Potassium: 4.1 mmol/L (ref 3.5–5.1)
Sodium: 132 mmol/L — ABNORMAL LOW (ref 135–145)
Total Bilirubin: 0.5 mg/dL (ref 0.3–1.2)
Total Protein: 6.6 g/dL (ref 6.5–8.1)

## 2022-09-05 LAB — CBC
HCT: 34.5 % — ABNORMAL LOW (ref 39.0–52.0)
Hemoglobin: 11.8 g/dL — ABNORMAL LOW (ref 13.0–17.0)
MCH: 30.2 pg (ref 26.0–34.0)
MCHC: 34.2 g/dL (ref 30.0–36.0)
MCV: 88.2 fL (ref 80.0–100.0)
Platelets: 116 10*3/uL — ABNORMAL LOW (ref 150–400)
RBC: 3.91 MIL/uL — ABNORMAL LOW (ref 4.22–5.81)
RDW: 14.9 % (ref 11.5–15.5)
WBC: 6.3 10*3/uL (ref 4.0–10.5)
nRBC: 0 % (ref 0.0–0.2)

## 2022-09-05 LAB — I-STAT CHEM 8, ED
BUN: 26 mg/dL — ABNORMAL HIGH (ref 8–23)
Calcium, Ion: 1.17 mmol/L (ref 1.15–1.40)
Chloride: 98 mmol/L (ref 98–111)
Creatinine, Ser: 1.3 mg/dL — ABNORMAL HIGH (ref 0.61–1.24)
Glucose, Bld: 238 mg/dL — ABNORMAL HIGH (ref 70–99)
HCT: 34 % — ABNORMAL LOW (ref 39.0–52.0)
Hemoglobin: 11.6 g/dL — ABNORMAL LOW (ref 13.0–17.0)
Potassium: 4.1 mmol/L (ref 3.5–5.1)
Sodium: 132 mmol/L — ABNORMAL LOW (ref 135–145)
TCO2: 24 mmol/L (ref 22–32)

## 2022-09-05 LAB — DIFFERENTIAL
Abs Immature Granulocytes: 0.02 10*3/uL (ref 0.00–0.07)
Basophils Absolute: 0 10*3/uL (ref 0.0–0.1)
Basophils Relative: 1 %
Eosinophils Absolute: 0.1 10*3/uL (ref 0.0–0.5)
Eosinophils Relative: 1 %
Immature Granulocytes: 0 %
Lymphocytes Relative: 18 %
Lymphs Abs: 1.1 10*3/uL (ref 0.7–4.0)
Monocytes Absolute: 0.5 10*3/uL (ref 0.1–1.0)
Monocytes Relative: 7 %
Neutro Abs: 4.6 10*3/uL (ref 1.7–7.7)
Neutrophils Relative %: 73 %

## 2022-09-05 LAB — GLUCOSE, CAPILLARY
Glucose-Capillary: 197 mg/dL — ABNORMAL HIGH (ref 70–99)
Glucose-Capillary: 274 mg/dL — ABNORMAL HIGH (ref 70–99)
Glucose-Capillary: 246 mg/dL — ABNORMAL HIGH (ref 70–99)

## 2022-09-05 LAB — APTT: aPTT: 31 seconds (ref 24–36)

## 2022-09-05 LAB — SEDIMENTATION RATE: Sed Rate: 30 mm/hr — ABNORMAL HIGH (ref 0–16)

## 2022-09-05 LAB — HIV ANTIBODY (ROUTINE TESTING W REFLEX): HIV Screen 4th Generation wRfx: NONREACTIVE

## 2022-09-05 LAB — CBG MONITORING, ED: Glucose-Capillary: 199 mg/dL — ABNORMAL HIGH (ref 70–99)

## 2022-09-05 LAB — ETHANOL: Alcohol, Ethyl (B): <10 mg/dL (ref <10)

## 2022-09-05 LAB — PROTIME-INR
INR: 1 (ref 0.8–1.2)
Prothrombin Time: 13.3 seconds (ref 11.4–15.2)

## 2022-09-05 MED ORDER — INSULIN ASPART 100 UNIT/ML IJ SOLN
0.0000 [IU] | Freq: Three times a day (TID) | INTRAMUSCULAR | Status: DC
  Administered 2022-09-05 – 2022-09-06 (×4): 4 [IU] via SUBCUTANEOUS
  Administered 2022-09-07: 11 [IU] via SUBCUTANEOUS
  Administered 2022-09-07: 7 [IU] via SUBCUTANEOUS

## 2022-09-05 MED ORDER — ENOXAPARIN SODIUM 40 MG/0.4ML IJ SOSY
40.0000 mg | PREFILLED_SYRINGE | INTRAMUSCULAR | Status: DC
  Administered 2022-09-05 – 2022-09-06 (×2): 40 mg via SUBCUTANEOUS
  Filled 2022-09-05 (×2): qty 0.4

## 2022-09-05 MED ORDER — HYDRALAZINE HCL 20 MG/ML IJ SOLN
5.0000 mg | Freq: Four times a day (QID) | INTRAMUSCULAR | Status: DC | PRN
  Administered 2022-09-06 (×2): 5 mg via INTRAVENOUS
  Filled 2022-09-05 (×2): qty 1

## 2022-09-05 MED ORDER — CLOPIDOGREL BISULFATE 75 MG PO TABS
75.0000 mg | ORAL_TABLET | Freq: Every day | ORAL | Status: DC
  Administered 2022-09-05 – 2022-09-07 (×3): 75 mg via ORAL
  Filled 2022-09-05 (×3): qty 1

## 2022-09-05 MED ORDER — ORAL CARE MOUTH RINSE: 15.0000 mL | OROMUCOSAL | Status: DC | PRN

## 2022-09-05 MED ORDER — STROKE: EARLY STAGES OF RECOVERY BOOK
Freq: Once | Status: AC
  Administered 2022-09-06: 1
  Filled 2022-09-05: qty 1

## 2022-09-05 MED ORDER — INSULIN ASPART 100 UNIT/ML IJ SOLN
0.0000 [IU] | Freq: Every day | INTRAMUSCULAR | Status: DC
  Administered 2022-09-05: 2 [IU] via SUBCUTANEOUS

## 2022-09-05 MED ORDER — ASPIRIN 81 MG PO TBEC
81.0000 mg | DELAYED_RELEASE_TABLET | Freq: Every day | ORAL | Status: DC
  Administered 2022-09-05 – 2022-09-07 (×3): 81 mg via ORAL
  Filled 2022-09-05 (×3): qty 1

## 2022-09-05 MED ORDER — LOSARTAN POTASSIUM 50 MG PO TABS
100.0000 mg | ORAL_TABLET | Freq: Every day | ORAL | Status: DC
  Administered 2022-09-05 – 2022-09-07 (×3): 100 mg via ORAL
  Filled 2022-09-05 (×3): qty 2

## 2022-09-05 MED ORDER — ATORVASTATIN CALCIUM 10 MG PO TABS
10.0000 mg | ORAL_TABLET | Freq: Every day | ORAL | Status: DC
  Administered 2022-09-05 – 2022-09-06 (×2): 10 mg via ORAL
  Filled 2022-09-05 (×2): qty 1

## 2022-09-05 MED ORDER — SENNOSIDES-DOCUSATE SODIUM 8.6-50 MG PO TABS: 1.0000 | ORAL_TABLET | Freq: Every evening | ORAL | Status: DC | PRN

## 2022-09-05 MED ORDER — ACETAMINOPHEN 160 MG/5ML PO SOLN: 650.0000 mg | ORAL | Status: DC | PRN

## 2022-09-05 MED ORDER — ACETAMINOPHEN 650 MG RE SUPP: 650.0000 mg | RECTAL | Status: DC | PRN

## 2022-09-05 MED ORDER — IOHEXOL 350 MG/ML SOLN
50.0000 mL | Freq: Once | INTRAVENOUS | Status: AC | PRN
  Administered 2022-09-05: 50 mL via INTRAVENOUS

## 2022-09-05 MED ORDER — ACETAMINOPHEN 325 MG PO TABS
650.0000 mg | ORAL_TABLET | ORAL | Status: DC | PRN
  Administered 2022-09-05 – 2022-09-07 (×6): 650 mg via ORAL
  Filled 2022-09-05 (×6): qty 2

## 2022-09-05 MED ORDER — STROKE: EARLY STAGES OF RECOVERY BOOK: Freq: Once | Status: DC

## 2022-09-05 NOTE — ED Notes (Signed)
Pt in bed and placed on monitor. PA at bedside.

## 2022-09-05 NOTE — ED Notes (Signed)
ED TO INPATIENT HANDOFF REPORT  ED Nurse Name and Phone #: [phone]  S Name/Age/Gender Dennis Wise 72 y.o. male Room/Bed: 003C/003C  Code Status   Code Status: Full Code  Home/SNF/Other Home Patient oriented to: self, place, time, and situation Is this baseline? Yes   Triage Complete: Triage complete  Chief Complaint Stroke [facility]) [I63.9]  Triage Note Pt reports falling on 3/24 and since then having memory issues, unsteady gait, and headache. Also reports depth perception problems x 2 weeks. Patient seen by PCP for same and had MRI this morning that showed acute R PCA distribution infarct and was sent to the ED for further eval. Denies any new neurological symptoms within the last 24 hours.    Allergies Allergies  Allergen Reactions   Promethazine Hcl     REACTION: AGITIATION    Level of Care/Admitting Diagnosis ED Disposition     ED Disposition  Admit   Condition  --   Comment  Hospital Area: [facility] [[id number]]  Level of Care: Telemetry Medical [[id number]]  May place patient in observation at [facility] or [facility] if equivalent level of care is available:: No  Covid Evaluation: Asymptomatic - no recent exposure (last 10 days) testing not required  Diagnosis: Stroke [facility]) [[id number]]  Admitting Physician: Emeline General [[id number]]  Attending Physician: Emeline General [[id number]]          B Medical/Surgery History Past Medical History:  Diagnosis Date   Anemia    Iron deficiency part of this   Cataract    bilateral lens implants   Diabetes mellitus    type II   [name] (HCC)    GERD (gastroesophageal reflux disease) 01/1989   Helicobacter pylori gastritis 06/11/2011   On EGD 05/2011    Hyperlipidemia    Hypertension 11/1999   Internal hemorrhoids    Iron deficiency anemia, unspecified 03/04/2011   MGUS (monoclonal gammopathy of unknown significance)    possible dx initially, had negative f/u   Pancytopenia, acquired (HCC)    Personal  history of colonic adenomas 06/05/2012   Pneumonia    Past Surgical History:  Procedure Laterality Date   CATARACT EXTRACTION Bilateral    COLONOSCOPY     ESOPHAGOGASTRODUODENOSCOPY     2012 H. pylori gastritis   FLEXIBLE SIGMOIDOSCOPY  05/1988   internal hemorrhoids   KNEE SURGERY  12/2002   lt knee fx repair ORIF   TIBIA FRACTURE SURGERY     left 2012     A IV Location/Drains/Wounds Patient Lines/Drains/Airways Status     Active Line/Drains/Airways     Name Placement date Placement time Site Days   Peripheral IV 09/05/22 20 G Anterior;Right Forearm 09/05/22  1200  Forearm  less than 1            Intake/Output Last 24 hours No intake or output data in the 24 hours ending 09/05/22 1317  Labs/Imaging Results for orders placed or performed during the hospital encounter of 09/05/22 (from the past 48 hour(s))  Protime-INR     Status: None   Collection Time: 09/05/22 10:50 AM  Result Value Ref Range   Prothrombin Time 13.3 11.4 - 15.2 seconds   INR 1.0 0.8 - 1.2    Comment: (NOTE) INR goal varies based on device and disease states. Performed at [facility], [address] [place], [place] [address]   APTT     Status: None   Collection Time: 09/05/22 10:50 AM  Result Value  Ref Range   aPTT 31 24 - 36 seconds    Comment: Performed at [facility], [address] [place], [place] [address]  CBC     Status: Abnormal   Collection Time: 09/05/22 10:50 AM  Result Value Ref Range   WBC 6.3 4.0 - 10.5 K/uL   RBC 3.91 (L) 4.22 - 5.81 MIL/uL   Hemoglobin 11.8 (L) 13.0 - 17.0 g/dL   HCT 46.9 (L) 62.9 - 52.8 %   MCV 88.2 80.0 - 100.0 fL   MCH 30.2 26.0 - 34.0 pg   MCHC 34.2 30.0 - 36.0 g/dL   RDW 41.3 24.4 - 01.0 %   Platelets 116 (L) 150 - 400 K/uL    Comment: REPEATED TO VERIFY   nRBC 0.0 0.0 - 0.2 %    Comment: Performed at [facility], [address], [place], [place] [address]  Differential     Status: None   Collection Time: 09/05/22 10:50  AM  Result Value Ref Range   Neutrophils Relative % 73 %   Neutro Abs 4.6 1.7 - 7.7 K/uL   Lymphocytes Relative 18 %   Lymphs Abs 1.1 0.7 - 4.0 K/uL   Monocytes Relative 7 %   Monocytes Absolute 0.5 0.1 - 1.0 K/uL   Eosinophils Relative 1 %   Eosinophils Absolute 0.1 0.0 - 0.5 K/uL   Basophils Relative 1 %   Basophils Absolute 0.0 0.0 - 0.1 K/uL   Immature Granulocytes 0 %   Abs Immature Granulocytes 0.02 0.00 - 0.07 K/uL    Comment: Performed at [facility], [address] [place], [place] [address]  Comprehensive metabolic panel     Status: Abnormal   Collection Time: 09/05/22 10:50 AM  Result Value Ref Range   Sodium 132 (L) 135 - 145 mmol/L   Potassium 4.1 3.5 - 5.1 mmol/L   Chloride 98 98 - 111 mmol/L   CO2 23 22 - 32 mmol/L   Glucose, Bld 233 (H) 70 - 99 mg/dL    Comment: Glucose reference range applies only to samples taken after fasting for at least 8 hours.   BUN 24 (H) 8 - 23 mg/dL   Creatinine, Ser 3.47 (H) 0.61 - 1.24 mg/dL   Calcium 9.1 8.9 - 42.5 mg/dL   Total Protein 6.6 6.5 - 8.1 g/dL   Albumin 3.6 3.5 - 5.0 g/dL   AST 24 15 - 41 U/L   ALT 18 0 - 44 U/L   Alkaline Phosphatase 204 (H) 38 - 126 U/L   Total Bilirubin 0.5 0.3 - 1.2 mg/dL   GFR, Estimated 55 (L) >60 mL/min    Comment: (NOTE) Calculated using the CKD-EPI Creatinine Equation (2021)    Anion gap 11 5 - 15    Comment: Performed at [facility], [address], [place], [place] [address]  Ethanol     Status: None   Collection Time: 09/05/22 10:50 AM  Result Value Ref Range   Alcohol, Ethyl (B) <10 <10 mg/dL    Comment: (NOTE) Lowest detectable limit for serum alcohol is 10 mg/dL.  For medical purposes only. Performed at [facility], [address], [place], [place] [address]   I-stat chem 8, ED     Status: Abnormal   Collection Time: 09/05/22 11:00 AM  Result Value Ref Range   Sodium 132 (L) 135 - 145 mmol/L   Potassium 4.1 3.5 - 5.1 mmol/L   Chloride 98 98 -  111  mmol/L   BUN 26 (H) 8 - 23 mg/dL   Creatinine, Ser 4.54 (H) 0.61 - 1.24 mg/dL   Glucose, Bld 098 (H) 70 - 99 mg/dL    Comment: Glucose reference range applies only to samples taken after fasting for at least 8 hours.   Calcium, Ion 1.17 1.15 - 1.40 mmol/L   TCO2 24 22 - 32 mmol/L   Hemoglobin 11.6 (L) 13.0 - 17.0 g/dL   HCT 11.9 (L) 14.7 - 82.9 %  CBG monitoring, ED     Status: Abnormal   Collection Time: 09/05/22 11:00 AM  Result Value Ref Range   Glucose-Capillary 199 (H) 70 - 99 mg/dL    Comment: Glucose reference range applies only to samples taken after fasting for at least 8 hours.   MR Brain Wo Contrast  Result Date: 09/05/2022 CLINICAL DATA:  Memory changes.  Fall with head injury August 2023. EXAM: MRI HEAD WITHOUT CONTRAST TECHNIQUE: Multiplanar, multiecho pulse sequences of the brain and surrounding structures were obtained without intravenous contrast. COMPARISON:  Head CT 11/19/2021 FINDINGS: Brain: Confluent restricted diffusion in the right occipital cortex also involving the white matter adjacent to the right temporal horn and atrium and at the thalamocapsular junction, extent respecting the PCA distribution. Brain atrophy especially affecting the right temporal lobe, also seen on prior. Chronic perforator infarct at the right corona radiata. Small vessel ischemic gliosis is mild for age. Vascular: Major flow voids are preserved Skull and upper cervical spine: No focal marrow lesion Sinuses/Orbits: Bilateral cataract resection.  No acute finding. These results will be called to the ordering clinician or representative by the Radiologist Assistant, and communication documented in the PACS or [employer]. IMPRESSION: 1. Acute right PCA distribution infarct. 2. Brain atrophy asymmetrically affecting the anterior right temporal lobe and correlating with the history of memory loss. Electronically Signed   By: Tiburcio Pea M.D.   On: 09/05/2022 08:26    Pending Labs Unresulted  Labs (From admission, onward)     Start     Ordered   09/06/22 0500  Lipid panel  (Labs)  Tomorrow morning,   R       Comments: Fasting    09/05/22 1215   09/05/22 1115  HIV Antibody (routine testing w rflx)  (HIV Antibody (Routine testing w reflex) panel)  Once,   URGENT        09/05/22 1114   09/05/22 1114  RPR  (Dementia Panel (PNL))  Once,   URGENT        09/05/22 1114   09/05/22 1114  Sedimentation rate  (Dementia Panel (PNL))  Once,   URGENT        09/05/22 1114   09/05/22 1114  Folate  (Dementia Panel (PNL))  Once,   URGENT        09/05/22 1114            Vitals/Pain Today's Vitals   09/05/22 1115 09/05/22 1130 09/05/22 1145 09/05/22 1215  BP: (!) 187/59 (!) 181/64 (!) 197/68 (!) 165/62  Pulse: 66 66 71 62  Resp: (!) 6 19 19  (!) 21  Temp:      SpO2: 98% 98% 95% 97%  PainSc:        Isolation Precautions No active isolations  Medications Medications   stroke: early stages of recovery book (has no administration in time range)  aspirin EC tablet 81 mg (has no administration in time range)  atorvastatin (LIPITOR) tablet 10 mg (has no administration in time  range)  losartan (COZAAR) tablet 100 mg (has no administration in time range)   stroke: early stages of recovery book (has no administration in time range)  acetaminophen (TYLENOL) tablet 650 mg (has no administration in time range)    Or  acetaminophen (TYLENOL) 160 MG/5ML solution 650 mg (has no administration in time range)    Or  acetaminophen (TYLENOL) suppository 650 mg (has no administration in time range)  senna-docusate (Senokot-S) tablet 1 tablet (has no administration in time range)  enoxaparin (LOVENOX) injection 40 mg (has no administration in time range)  insulin aspart (novoLOG) injection 0-20 Units (has no administration in time range)  insulin aspart (novoLOG) injection 0-5 Units (has no administration in time range)  clopidogrel (PLAVIX) tablet 75 mg (has no administration in time range)   hydrALAZINE (APRESOLINE) injection 5 mg (has no administration in time range)  iohexol (OMNIPAQUE) 350 MG/ML injection 50 mL (50 mLs Intravenous Contrast Given 09/05/22 1239)    Mobility walks with person assist     Focused Assessments Neuro Assessment Handoff:  Swallow screen pass? Yes    NIH Stroke Scale  Dizziness Present: No Headache Present: Yes Interval: Shift assessment Level of Consciousness (1a.)   : Alert, keenly responsive LOC Questions (1b. )   : Answers both questions correctly LOC Commands (1c. )   : Performs both tasks correctly Best Gaze (2. )  : Normal Visual (3. )  : Partial hemianopia Facial Palsy (4. )    : Normal symmetrical movements Motor Arm, Left (5a. )   : No drift Motor Arm, Right (5b. ) : No drift Motor Leg, Left (6a. )  : No drift Motor Leg, Right (6b. ) : No drift Limb Ataxia (7. ): Absent Sensory (8. )  : Normal, no sensory loss Best Language (9. )  : No aphasia Dysarthria (10. ): Normal Extinction/Inattention (11.)   : No Abnormality Complete NIHSS TOTAL: 1     Neuro Assessment: Exceptions to WDL Neuro Checks:   Shift assessment (09/05/22 1204)  Has TPA been given? No If patient is a Neuro Trauma and patient is going to OR before floor call report to 4N Charge nurse: [phone] or [phone]   R Recommendations: See Admitting Provider Note  Report given to:   Additional Notes:

## 2022-09-05 NOTE — Telephone Encounter (Signed)
French Ana with [facility] Radiology called report on MRI of head without contrast; report is in Epic. Taking report to Dr Para March area. Sending note to Dr Para March and Para March pool and will speak with [facility] CMA.

## 2022-09-05 NOTE — Telephone Encounter (Signed)
Patient called and advised stroke was seen on MRI and needs to go to ER. Patient verbalized understanding.

## 2022-09-05 NOTE — Telephone Encounter (Signed)
Call pt.  Stroke seen on MR.  Needs to go to ER.

## 2022-09-05 NOTE — Progress Notes (Signed)
Patient arrived from ED via stretcher; patient is alert and oriented; wife is at bedside; oriented patient and wife to room and unit routine; fall safety reviewed; bed low, alarmed and locked for safety.

## 2022-09-05 NOTE — ED Triage Notes (Signed)
Pt reports falling on 3/24 and since then having memory issues, unsteady gait, and headache. Also reports depth perception problems x 2 weeks. Patient seen by PCP for same and had MRI this morning that showed acute R PCA distribution infarct and was sent to the ED for further eval. Denies any new neurological symptoms within the last 24 hours.
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
## 2022-09-06 ENCOUNTER — Other Ambulatory Visit: Payer: Medicare HMO

## 2022-09-06 ENCOUNTER — Observation Stay (HOSPITAL_COMMUNITY): Payer: Medicare HMO

## 2022-09-06 DIAGNOSIS — I10 Essential (primary) hypertension: Secondary | ICD-10-CM

## 2022-09-06 DIAGNOSIS — I63531 Cerebral infarction due to unspecified occlusion or stenosis of right posterior cerebral artery: Secondary | ICD-10-CM | POA: Diagnosis not present

## 2022-09-06 DIAGNOSIS — I6389 Other cerebral infarction: Secondary | ICD-10-CM | POA: Diagnosis not present

## 2022-09-06 LAB — PHOSPHORUS: Phosphorus: 3.3 mg/dL (ref 2.5–4.6)

## 2022-09-06 LAB — CBC WITH DIFFERENTIAL/PLATELET
Abs Immature Granulocytes: 0.01 K/uL (ref 0.00–0.07)
Basophils Absolute: 0 K/uL (ref 0.0–0.1)
Basophils Relative: 1 %
Eosinophils Absolute: 0.1 K/uL (ref 0.0–0.5)
Eosinophils Relative: 1 %
HCT: 34.9 % — ABNORMAL LOW (ref 39.0–52.0)
Hemoglobin: 12 g/dL — ABNORMAL LOW (ref 13.0–17.0)
Immature Granulocytes: 0 %
Lymphocytes Relative: 15 %
Lymphs Abs: 0.9 K/uL (ref 0.7–4.0)
MCH: 30.1 pg (ref 26.0–34.0)
MCHC: 34.4 g/dL (ref 30.0–36.0)
MCV: 87.5 fL (ref 80.0–100.0)
Monocytes Absolute: 0.6 K/uL (ref 0.1–1.0)
Monocytes Relative: 9 %
Neutro Abs: 4.6 K/uL (ref 1.7–7.7)
Neutrophils Relative %: 74 %
Platelets: 118 K/uL — ABNORMAL LOW (ref 150–400)
RBC: 3.99 MIL/uL — ABNORMAL LOW (ref 4.22–5.81)
RDW: 15.1 % (ref 11.5–15.5)
WBC: 6.2 K/uL (ref 4.0–10.5)
nRBC: 0 % (ref 0.0–0.2)

## 2022-09-06 LAB — GLUCOSE, CAPILLARY
Glucose-Capillary: 151 mg/dL — ABNORMAL HIGH (ref 70–99)
Glucose-Capillary: 165 mg/dL — ABNORMAL HIGH (ref 70–99)
Glucose-Capillary: 169 mg/dL — ABNORMAL HIGH (ref 70–99)
Glucose-Capillary: 149 mg/dL — ABNORMAL HIGH (ref 70–99)

## 2022-09-06 LAB — COMPREHENSIVE METABOLIC PANEL
ALT: 17 U/L (ref 0–44)
AST: 21 U/L (ref 15–41)
Albumin: 3.5 g/dL (ref 3.5–5.0)
Alkaline Phosphatase: 159 U/L — ABNORMAL HIGH (ref 38–126)
Anion gap: 12 (ref 5–15)
BUN: 22 mg/dL (ref 8–23)
CO2: 24 mmol/L (ref 22–32)
Calcium: 9.1 mg/dL (ref 8.9–10.3)
Chloride: 97 mmol/L — ABNORMAL LOW (ref 98–111)
Creatinine, Ser: 1.28 mg/dL — ABNORMAL HIGH (ref 0.61–1.24)
GFR, Estimated: 59 mL/min — ABNORMAL LOW (ref >60)
Glucose, Bld: 172 mg/dL — ABNORMAL HIGH (ref 70–99)
Potassium: 4.2 mmol/L (ref 3.5–5.1)
Sodium: 133 mmol/L — ABNORMAL LOW (ref 135–145)
Total Bilirubin: 0.9 mg/dL (ref 0.3–1.2)
Total Protein: 6.5 g/dL (ref 6.5–8.1)

## 2022-09-06 LAB — ECHOCARDIOGRAM COMPLETE: Weight: 4091.74 oz

## 2022-09-06 LAB — LIPID PANEL
Cholesterol: 183 mg/dL (ref 0–200)
HDL: 110 mg/dL (ref >40)
LDL Cholesterol: 59 mg/dL (ref 0–99)
Total CHOL/HDL Ratio: 1.7 RATIO
Triglycerides: 71 mg/dL (ref <150)
VLDL: 14 mg/dL (ref 0–40)

## 2022-09-06 LAB — RPR: RPR Ser Ql: NONREACTIVE

## 2022-09-06 LAB — MAGNESIUM: Magnesium: 1.7 mg/dL (ref 1.7–2.4)

## 2022-09-06 NOTE — Progress Notes (Signed)
Came bedside for echo, but patient is with speech therapist at this time.

## 2022-09-06 NOTE — Evaluation (Signed)
Occupational Therapy Evaluation Patient Details Name: Dennis Wise MRN: [id number] DOB: 08-14-1950 Today's Date: 09/06/2022   History of Present Illness Pt is a 72 y/o M presenting to ED on 5/17 with memory issues, unsteady gait, and headache since fall (in March 2024). MRI revealing acute R PCA distribution infarct, sent from PCP office for stroke workup. PMH includes GERD, HLD, HTN, DM2, anemia, MGUS, chronic thrombocytopenia.   Clinical Impression   Pt reports using cane at baseline for mobility, ind with ADLs and spouse assists with medication mgmt, can also assist with transportation. Pt presenting with cognitive and visual deficits, along with L arm discomfort (states from prior fall), pt needing supervision- minA  for ADLs, mod I for bed mobility, and min guard for transfers with [employer]. Pt with L inattention, needing verbal/tactile cues to attend to L side, educated on compensatory strategies and scanning environment, pt and spouse verbalized understanding. Pt presenting with impairments listed below, will follow acutely. Recommend OP OT at d/c.       Recommendations for follow up therapy are one component of a multi-disciplinary discharge planning process, led by the attending physician.  Recommendations may be updated based on patient status, additional functional criteria and insurance authorization.   Assistance Recommended at Discharge Intermittent Supervision/Assistance  Patient can return home with the following Direct supervision/assist for medications management;Direct supervision/assist for financial management;Assist for transportation;A little help with walking and/or transfers;A little help with bathing/dressing/bathroom    Functional Status Assessment  Patient has had a recent decline in their functional status and demonstrates the ability to make significant improvements in function in a reasonable and predictable amount of time.  Equipment Recommendations  None recommended  by OT (pt has all needed DME)    Recommendations for Other Services PT consult     Precautions / Restrictions Precautions Precautions: Fall Precaution Comments: L inattention Restrictions Weight Bearing Restrictions: No      Mobility Bed Mobility Overal bed mobility: Modified Independent                  Transfers Overall transfer level: Needs assistance Equipment used: Straight cane Transfers: Sit to/from Stand Sit to Stand: Min guard                  Balance Overall balance assessment: Needs assistance Sitting-balance support: Feet supported Sitting balance-Leahy Scale: Good     Standing balance support: During functional activity, Reliant on assistive device for balance Standing balance-Leahy Scale: Fair Standing balance comment: stands statically without LOB                           ADL either performed or assessed with clinical judgement   ADL Overall ADL's : Needs assistance/impaired Eating/Feeding: Supervision/ safety   Grooming: Min guard;Wash/dry face;Standing   Upper Body Bathing: Minimal assistance;Sitting   Lower Body Bathing: Minimal assistance;Sitting/lateral leans   Upper Body Dressing : Minimal assistance;Sitting   Lower Body Dressing: Minimal assistance;Sitting/lateral leans   Toilet Transfer: Min guard;Ambulation (cane)   Toileting- Clothing Manipulation and Hygiene: Min guard       Functional mobility during ADLs: Min guard;Cane       Vision Baseline Vision/History: 1 Wears glasses (for distance) Ability to See in Adequate Light: 1 Impaired Patient Visual Report: Peripheral vision impairment Vision Assessment?: Yes Eye Alignment: Within Functional Limits Ocular Range of Motion: Within Functional Limits Alignment/Gaze Preference: Within Defined Limits Tracking/Visual Pursuits: Decreased smoothness of eye movement to LEFT superior field;Decreased  smoothness of eye movement to LEFT inferior field Saccades:  Additional head turns occurred during testing (turns head to L to compensate) Convergence: Within functional limits Visual Fields: Left visual field deficit Depth Perception: Overshoots;Undershoots Additional Comments: able to track to L side of visual field, however when presented with bilateral stimuli, only attends to R side to midline     Perception Perception Perception Tested?: No   Praxis Praxis Praxis tested?: Not tested    Pertinent Vitals/Pain Pain Assessment Pain Score: 4  Faces Pain Scale: Hurts even more Pain Location: L shoulder/arm, headache Pain Descriptors / Indicators: Discomfort Pain Intervention(s): Limited activity within patient's tolerance, Monitored during session, Repositioned     Hand Dominance Right   Extremity/Trunk Assessment Upper Extremity Assessment Upper Extremity Assessment: Generalized weakness;RUE deficits/detail;LUE deficits/detail RUE Deficits / Details: decreased fine motor coordiantion with thumb opposition RUE Sensation: WNL RUE Coordination: decreased fine motor LUE Deficits / Details: mild decreased strenght compared to RUE, slowed fine motor coordination and slowed finger to nose compared to RUE LUE: Shoulder pain with ROM (at anterior shoulder) LUE Sensation: WNL LUE Coordination: decreased fine motor   Lower Extremity Assessment Lower Extremity Assessment: Defer to PT evaluation   Cervical / Trunk Assessment Cervical / Trunk Assessment: Normal   Communication Communication Communication: No difficulties   Cognition Arousal/Alertness: Awake/alert Behavior During Therapy: Flat affect, WFL for tasks assessed/performed Overall Cognitive Status: Impaired/Different from baseline Area of Impairment: Attention, Memory, Following commands, Safety/judgement, Awareness, Problem solving                   Current Attention Level: Focused Memory: Decreased short-term memory Following Commands: Follows one step commands with  increased time Safety/Judgement: Decreased awareness of deficits (L inattention) Awareness: Anticipatory Problem Solving: Requires verbal cues, Difficulty sequencing, Slow processing General Comments: can count backwards, states months of year backwards, can recall 3/3 words after ~68mins. slowed processing noted with basic tasks     General Comments  VSS on RA    Exercises     Shoulder Instructions      Home Living Family/patient expects to be discharged to:: Private residence Living Arrangements: Spouse/significant other Available Help at Discharge: Family;Available 24 hours/day Type of Home: House Home Access: Stairs to enter [employer] of Steps: 2 Entrance Stairs-Rails: Can reach both Home Layout: One level;Able to live on main level with bedroom/bathroom     Bathroom Shower/Tub: Walk-in shower         Home Equipment: Grab bars - tub/shower;Shower Counsellor (2 wheels);Cane - single point;Crutches          Prior Functioning/Environment Prior Level of Function : Independent/Modified Independent             Mobility Comments: uses SPC for mobility ADLs Comments: spouse does pt's medications, overall ind with ADLs        OT Problem List: Decreased strength;Decreased range of motion;Decreased activity tolerance;Impaired balance (sitting and/or standing);Impaired vision/perception;Decreased cognition;Decreased safety awareness      OT Treatment/Interventions: Self-care/ADL training;Therapeutic exercise;Energy conservation;DME and/or AE instruction;Therapeutic activities;Patient/family education;Balance training;Visual/perceptual remediation/compensation;Cognitive remediation/compensation    OT Goals(Current goals can be found in the care plan section) Acute Rehab OT Goals Patient Stated Goal: none stated OT Goal Formulation: With patient Time For Goal Achievement: 09/20/22 Potential to Achieve Goals: Good ADL Goals Pt Will Perform Lower  Body Dressing: Independently;sitting/lateral leans;sit to/from stand Pt Will Transfer to Toilet: Independently;ambulating;regular height toilet Additional ADL Goal #1: pt will be able to identify and locate 5 items on L side  in prep for ADLs Additional ADL Goal #2: pt will follow 3 step command in prep for ADLs  OT Frequency: Min 1X/week    Co-evaluation              AM-PAC OT "6 Clicks" Daily Activity     Outcome Measure Help from another person eating meals?: None Help from another person taking care of personal grooming?: A Little Help from another person toileting, which includes using toliet, bedpan, or urinal?: A Little Help from another person bathing (including washing, rinsing, drying)?: A Little Help from another person to put on and taking off regular upper body clothing?: A Little Help from another person to put on and taking off regular lower body clothing?: A Little 6 Click Score: 19   End of Session Equipment Utilized During Treatment: Gait belt;Other (comment) (cane) Nurse Communication: Mobility status;Other (comment) (L inattention)  Activity Tolerance: Patient tolerated treatment well Patient left: in bed;with bed alarm set;with call bell/phone within reach;with family/visitor present  OT Visit Diagnosis: Unsteadiness on feet (R26.81);Other abnormalities of gait and mobility (R26.89);Repeated falls (R29.6);Muscle weakness (generalized) (M62.81);Other symptoms and signs involving cognitive function                Time: 1610-9604 OT Time Calculation (min): 32 min Charges:  OT General Charges $OT Visit: 1 Visit OT Evaluation $OT Eval Moderate Complexity: 1 Mod OT Treatments $Self Care/Home Management : 8-22 mins  Carver Fila, OTD, OTR/L SecureChat Preferred Acute Rehab [phone]   Carver Fila Koonce 09/06/2022, 10:35 AM

## 2022-09-06 NOTE — Progress Notes (Addendum)
STROKE TEAM PROGRESS NOTE   INTERVAL HISTORY His wife is at the bedside.  Patient is sitting in the chair in no apparent distress.   Neurological exam is stable  Vitals:   09/06/22 0405 09/06/22 0733 09/06/22 1100 09/06/22 1438  BP: (!) 168/59 (!) 174/56 (!) 166/57 (!) 176/68  Pulse: (!) 55 (!) 55 (!) 57 61  Resp: 19 18 18 18   Temp: 98.2 F (36.8 C) 98.6 F (37 C) 98.4 F (36.9 C) 98.1 F (36.7 C)  TempSrc: Oral Oral Oral Oral  SpO2: 99% 98% 97% 96%  Weight:      Height:       CBC:  Recent Labs  Lab 09/05/22 1050 09/05/22 1100 09/06/22 1141  WBC 6.3  --  6.2  NEUTROABS 4.6  --  4.6  HGB 11.8* 11.6* 12.0*  HCT 34.5* 34.0* 34.9*  MCV 88.2  --  87.5  PLT 116*  --  118*   Basic Metabolic Panel:  Recent Labs  Lab 09/05/22 1050 09/05/22 1100 09/06/22 1141  NA 132* 132* 133*  K 4.1 4.1 4.2  CL 98 98 97*  CO2 23  --  24  GLUCOSE 233* 238* 172*  BUN 24* 26* 22  CREATININE 1.36* 1.30* 1.28*  CALCIUM 9.1  --  9.1  MG  --   --  1.7  PHOS  --   --  3.3   Lipid Panel:  Recent Labs  Lab 09/06/22 0420  CHOL 183  TRIG 71  HDL 110  CHOLHDL 1.7  VLDL 14  LDLCALC 59   HgbA1c: No results for input(s): "HGBA1C" in the last 168 hours. Urine Drug Screen: No results for input(s): "LABOPIA", "COCAINSCRNUR", "LABBENZ", "AMPHETMU", "THCU", "LABBARB" in the last 168 hours.  Alcohol Level  Recent Labs  Lab 09/05/22 1050  ETH <10    IMAGING past 24 hours No results found.  PHYSICAL EXAM  Temp:  [98.1 F (36.7 C)-99.3 F (37.4 C)] 98.1 F (36.7 C) (05/18 1438) Pulse Rate:  [55-63] 61 (05/18 1438) Resp:  [18-19] 18 (05/18 1438) BP: (158-190)/(56-85) 176/68 (05/18 1438) SpO2:  [95 %-99 %] 96 % (05/18 1438) Weight:  [478 kg] 116 kg (05/17 1657)  General - Well nourished, well developed, in no apparent distress. Cardiovascular - Regular rhythm and rate.  Mental Status -  Level of arousal and orientation to time, place, and person were intact. Language including  expression, naming, repetition, comprehension was assessed and found intact. Attention span and concentration were normal. Recent and remote memory were intact. Fund of Knowledge was assessed and was intact.  Cranial Nerves II - XII - II - Visual field left hemianopia III, IV, VI - Extraocular movements intact. V - Facial sensation intact bilaterally. VII -mild left facial droop VIII - Hearing & vestibular intact bilaterally. X - Palate elevates symmetrically. XI - Chin turning & shoulder shrug intact bilaterally. XII - Tongue protrusion intact.  Motor Strength - The patient's strength was normal in all extremities and pronator drift was absent.  Bulk was normal and fasciculations were absent.   Motor Tone - Muscle tone was assessed at the neck and appendages and was normal.  Sensory - Light touch, temperature/pinprick were assessed and were symmetrical.    Coordination -thumb ataxia  Gait and Station - deferred.  ASSESSMENT/PLAN Dennis Wise is a 72 y.o. male with history of hypertension, diabetes, hyperlipidemia, MGUS, chronic thrombocytopenia, GERD, iron deficiency anemia sent from PCPs office for stroke workup.  He has been  having multiple falls since March, outpatient MRI was ordered which revealed acute right PCA stroke   Stroke: Acute right PCA ischemic infarct Etiology: Large vessel disease CT head hypodensity in right PCA territory CTA head & neck complete occlusion of right PCA.  Focal atherosclerotic disease in the right greater than left MCA severe stenosis of M2 branches, occlusion of small right MCA branch and anterior temporal lobe stenosis and left M2, occluded or severely stenosed A1 MRI acute right PCA infarct 2D Echo ordered LDL 59 HgbA1c 7.8 VTE prophylaxis -Lovenox    Diet   Diet heart healthy/carb modified Room service appropriate? Yes with Assist; Fluid consistency: Thin   81 mg sprint prior to admission, now on aspirin 81 mg daily and Plavix 75 mg  daily.  For 3 months then Plavix alone Will need outpatient neurology follow-up in 4 to 8 weeks after discharge Therapy recommendations: Pending Disposition: Pending  Hypertension Home meds: Losartan 50 mg Stable Losartan increased to 100 mg Permissive hypertension (OK if < 220/120) but gradually normalize in 5-7 days Long-term BP goal normotensive  Hyperlipidemia Home meds: Atorvastatin 10 mg, resumed in hospital LDL 59, goal < 70 Continue statin at discharge  Diabetes type II UnControlled Home meds: Glimepiride 2 mg, metformin 850 mg twice daily, Actos 45 mg HgbA1c 7.8, goal < 7.0 CBGs Recent Labs    09/05/22 2036 09/06/22 0620 09/06/22 1123  GLUCAP 246* 151* 165*    SSI Will need close outpatient follow-up with primary care physician for diabetes management  Other Stroke Risk Factors Advanced Age >/= 16  Obesity, Body mass index is 33.74 kg/m., BMI >/= 30 associated with increased stroke risk, recommend weight loss, diet and exercise as appropriate   Hospital day # 0  Gevena Mart DNP, ACNPC-AG  [facility]  ATTENDING ATTESTATION:  Right PCA stroke on MRI with occluded P1 on CTA.  DAPT therapy for 90 days.  Echo is pending.  Given intracranial atherosclerosis I see no need for outpatient telemetry as cardiac etiology is low.  If echo is unremarkable no further workup is needed.  Can follow-up in the stroke clinic after discharge in 4 to 6 weeks.  Neurology to sign off please call with questions.  Dr. Viviann Spare evaluated pt independently, reviewed imaging, chart, labs. Discussed and formulated plan with the Resident/APP. Changes were made to the note where appropriate. Please see APP/resident note above for details.       Tamarcus Condie,MD   To contact Stroke Continuity provider, please refer to [email or web address]. After hours, contact General Neurology
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
## 2022-09-06 NOTE — Progress Notes (Signed)
PROGRESS NOTE    Dennis Wise  ZOX:[id number] DOB: May 21, 1950 DOA: 09/05/2022 PCP: [name], MD   Brief Narrative:  Patient is a 72 year old Caucasian obese male with a past medical history significant for but limited to diabetes mellitus type 2, hypertension, hyperlipidemia, MGUS, chronic thrombocytopenia as well as other comorbidities who was sent from his PCP office for stroke workup.  Patient had s symptoms on March 21 after he sustained a mechanical fall and hit his left shoulder and rib cage.  He had no fractures or dislocation and since then the patient had episodes of clamminess, unsteady gait and noted 3 episodes of falls at home at different times.  They also reported the patient appeared to have some memory issues after initial event monitor and also stated that he had started decreased short-term memory and complained of worsening vision.  He denies any numbness, weakness of his limbs denied choking or cough or swallowing problems.  He went to see his PCP who ordered an outpatient MRI which was positive for acute right PCA stroke and PCP asked the patient to come to the ED right away.  Patient's family has also reported poorly controlled blood pressure and glucose at home recently.  Given his acute right PCA stroke neurology was consulted and he is undergoing stroke workup.  Currently stroke workup is nearly complete but pending echocardiogram.  Assessment and Plan:  Acute left PCA stroke -With symptoms of ataxia and left visual neglect and short memory impairment -Neurology consultation appreciated, agreed with aspirin plus Plavix regimen recommending it for 3 months given that they felt that the etiology is large vessel disease -CT head showed a hypodensity right PCA territory -CTA of the head and neck showed complete occlusion of the right PCA and focal atherosclerotic disease in the right greater than left MCA severe stenosis of the M2 branches, occlusion of the small right MCA  branch and anterior temporal lobe stenosis and left M2 and occluded or severely stenosed A1 -MRI showed acute right PCA infarct -Likely symptoms onset more than 48 hours, out of window for permissive hypertension, will increase his blood pressure meds losartan to 100 mg daily, add as needed hydralazine -Continue atorvastatin 10 mg p.o. daily given that his LDL is at goal of 59 -History does not have permissive hypertension but now neurology has restarted his blood pressure medications -Most recent A1c 7.8 -Echocardiogram ordered and pending to be done and patient can likely be discharged after this is done -PT evaluation recommending outpatient physical therapy Occupational Therapy and SLP and patient would like to do this at [facility]   HTN, uncontrolled -As above -Home medications include losartan and this has been increased to 100 mg p.o. daily -Neurology did recommend continuing permissive hypertension and blood pressure is okay less than 220/120 and normalizing in 5 to 7 days -Continue to monitor blood pressures per protocol -Last blood pressure reading was little elevated at 176/68   IIDM with hyperglycemia -Appears to be fairly controlled by most recent A1c in March of 7.8 -Hold off home diabetic medication including metformin Actos and glimepiride, start sliding scale -CBG Trend:  Recent Labs  Lab 09/05/22 1100 09/05/22 1511 09/05/22 1804 09/05/22 2036 09/06/22 0620 09/06/22 1123 09/06/22 1635  GLUCAP 199* 197* 274* 246* 151* 165* 169*    MGUS and chronic thrombocytopenia -Platelet Count Trend: Recent Labs  Lab 09/05/22 1050 09/06/22 1141  PLT 116* 118*  -Has been followed with oncology and considered to be stable on recent office  visit  Hyponatremia -Mild -Sodium is 133 and will need to monitor and trend and repeat CMP in a.m.  CKD stage IIIa -BUN/Cr Trend: Recent Labs  Lab 09/05/22 1050 09/05/22 1100 09/06/22 1141  BUN 24* 26* 22  CREATININE 1.36* 1.30* 1.28*   -Avoid Nephrotoxic Medications, Contrast Dyes, Hypotension and Dehydration to Ensure Adequate Renal Perfusion and will need to Renally Adjust Meds -Continue to Monitor and Trend Renal Function carefully and repeat CMP in the AM   Normocytic Anemia -Hgb/Hct Trend: Recent Labs  Lab 09/05/22 1050 09/05/22 1100 09/06/22 1141  HGB 11.8* 11.6* 12.0*  HCT 34.5* 34.0* 34.9*  MCV 88.2  --  87.5  -Check Anemia Panel in the AM -Continue care for signs and symptoms of bleeding; no overt bleeding noted -Repeat CBC in a.m.  Obesity -Complicates overall prognosis and care -Estimated body mass index is 33.74 kg/m as calculated from the following:   Height as of this encounter: 6\' 1"  (1.854 m).   Weight as of this encounter: 116 kg.  -Weight Loss and Dietary Counseling given   DVT prophylaxis: enoxaparin (LOVENOX) injection 40 mg Start: 09/05/22 1600    Code Status: Full Code Family Communication: Discussed with wife at bedside  Disposition Plan:  Level of care: Telemetry Medical Status is: Observation The patient will require care spanning > 2 midnights and should be moved to inpatient because: Undergoing stroke workup and echocardiogram still pending   Consultants:  Neurology  Procedures:  As delineated as above  Antimicrobials:  Anti-infectives (From admission, onward)    None       Subjective: Seen and examined at bedside was doing okay.  States that he continues to feel weak a little bit.  No nausea or vomiting.  Denies any lightheadedness or dizziness.  No other concerns or complaints this time.  Objective: Vitals:   09/06/22 0405 09/06/22 0733 09/06/22 1100 09/06/22 1438  BP: (!) 168/59 (!) 174/56 (!) 166/57 (!) 176/68  Pulse: (!) 55 (!) 55 (!) 57 61  Resp: 19 18 18 18   Temp: 98.2 F (36.8 C) 98.6 F (37 C) 98.4 F (36.9 C) 98.1 F (36.7 C)  TempSrc: Oral Oral Oral Oral  SpO2: 99% 98% 97% 96%  Weight:      Height:        Intake/Output Summary (Last 24  hours) at 09/06/2022 1851 Last data filed at 09/06/2022 1300 Gross per 24 hour  Intake 240 ml  Output --  Net 240 ml   Filed Weights   09/05/22 1657  Weight: 116 kg   Examination: Physical Exam:  Constitutional: WN/WD obese Caucasian male in no acute distress appears calm Respiratory: Diminished to auscultation bilaterally, no wheezing, rales, rhonchi or crackles. Normal respiratory effort and patient is not tachypenic. No accessory muscle use.  Unlabored breathing Cardiovascular: RRR, no murmurs / rubs / gallops. S1 and S2 auscultated. No extremity edema.  Abdomen: Soft, non-tender, distended secondary to body habitus bowel sounds positive.  GU: Deferred. Musculoskeletal: No clubbing / cyanosis of digits/nails. No joint deformity upper and lower extremities.  Skin: No rashes, lesions, ulcers limited skin evaluation. No induration; Warm and dry.  Neurologic: CN 2-12 grossly intact with no focal deficits. Romberg sign and cerebellar reflexes not assessed.  Psychiatric: Normal judgment and insight. Alert and oriented x 3. Normal mood and appropriate affect.   Data Reviewed: I have personally reviewed following labs and imaging studies  CBC: Recent Labs  Lab 09/05/22 1050 09/05/22 1100 09/06/22 1141  WBC 6.3  --  6.2  NEUTROABS 4.6  --  4.6  HGB 11.8* 11.6* 12.0*  HCT 34.5* 34.0* 34.9*  MCV 88.2  --  87.5  PLT 116*  --  118*   Basic Metabolic Panel: Recent Labs  Lab 09/05/22 1050 09/05/22 1100 09/06/22 1141  NA 132* 132* 133*  K 4.1 4.1 4.2  CL 98 98 97*  CO2 23  --  24  GLUCOSE 233* 238* 172*  BUN 24* 26* 22  CREATININE 1.36* 1.30* 1.28*  CALCIUM 9.1  --  9.1  MG  --   --  1.7  PHOS  --   --  3.3   GFR: Estimated Creatinine Clearance: 67.5 mL/min (A) (by C-G formula based on SCr of 1.28 mg/dL (H)). Liver Function Tests: Recent Labs  Lab 09/05/22 1050 09/06/22 1141  AST 24 21  ALT 18 17  ALKPHOS 204* 159*  BILITOT 0.5 0.9  PROT 6.6 6.5  ALBUMIN 3.6 3.5    No results for input(s): "LIPASE", "AMYLASE" in the last 168 hours. No results for input(s): "AMMONIA" in the last 168 hours. Coagulation Profile: Recent Labs  Lab 09/05/22 1050  INR 1.0   Cardiac Enzymes: No results for input(s): "CKTOTAL", "CKMB", "CKMBINDEX", "TROPONINI" in the last 168 hours. BNP (last 3 results) No results for input(s): "PROBNP" in the last 8760 hours. HbA1C: No results for input(s): "HGBA1C" in the last 72 hours. CBG: Recent Labs  Lab 09/05/22 1804 09/05/22 2036 09/06/22 0620 09/06/22 1123 09/06/22 1635  GLUCAP 274* 246* 151* 165* 169*   Lipid Profile: Recent Labs    09/06/22 0420  CHOL 183  HDL 110  LDLCALC 59  TRIG 71  CHOLHDL 1.7   Thyroid Function Tests: No results for input(s): "TSH", "T4TOTAL", "FREET4", "T3FREE", "THYROIDAB" in the last 72 hours. Anemia Panel: Recent Labs    09/05/22 1156  FOLATE 19.8   Sepsis Labs: No results for input(s): "PROCALCITON", "LATICACIDVEN" in the last 168 hours.  No results found for this or any previous visit (from the past 240 hour(s)).   Radiology Studies: CT ANGIO HEAD NECK W WO CM  Result Date: 09/05/2022 CLINICAL DATA:  Acute infarct on same-day MRI EXAM: CT ANGIOGRAPHY HEAD AND NECK WITH AND WITHOUT CONTRAST TECHNIQUE: Multidetector CT imaging of the head and neck was performed using the standard protocol during bolus administration of intravenous contrast. Multiplanar CT image reconstructions and MIPs were obtained to evaluate the vascular anatomy. Carotid stenosis measurements (when applicable) are obtained utilizing NASCET criteria, using the distal internal carotid diameter as the denominator. RADIATION DOSE REDUCTION: This exam was performed according to the departmental dose-optimization program which includes automated exposure control, adjustment of the mA and/or kV according to patient size and/or use of iterative reconstruction technique. CONTRAST:  50mL OMNIPAQUE IOHEXOL 350 MG/ML SOLN  COMPARISON:  MRI head 09/05/2022, CT head 11/19/2021, no prior CTA FINDINGS: CT HEAD FINDINGS Brain: Hypodensity in the right PCA territory, involving right occipital lobe and posterior right temporal lobe, extending to the posterior aspect of the thalamus and posterior limb the internal capsule. No evidence of acute hemorrhage, mass, mass effect, or midline shift. No hydrocephalus or extra-axial fluid collection. Enlargement of the right temporal horn, suggesting disproportionate right temporal lobe atrophy. Remote infarcts in the right basal ganglia and corona radiata. Vascular: Please see CTA findings below. Skull: Negative for fracture or focal lesion. Sinuses/Orbits: Mild mucosal thickening in the right maxillary sinus. Status post bilateral lens replacements. Other: The mastoid air cells are well aerated. CTA NECK FINDINGS  Aortic arch: Four-vessel arch, with the left vertebral artery originating from the aorta. Imaged portion shows no evidence of aneurysm or dissection. No significant stenosis of the major arch vessel origins. Mild aortic atherosclerosis. Right carotid system: No evidence of dissection, occlusion, or hemodynamically significant stenosis (greater than 50%). Left carotid system: No evidence of dissection, occlusion, or hemodynamically significant stenosis (greater than 50%). Vertebral arteries: No evidence of dissection, occlusion, or hemodynamically significant stenosis (greater than 50%). Skeleton: No acute osseous abnormality. Degenerative changes in the cervical spine. Other neck: No acute finding. Upper chest: Emphysema. No focal pulmonary opacity or pleural effusion. Review of the MIP images confirms the above findings CTA HEAD FINDINGS Evaluation is somewhat limited by bolus timing. Anterior circulation: Both internal carotid arteries are patent to the termini, without significant stenosis. Patent left A1. Occluded or severely stenosed proximal right A1 (series 9, image 94), with likely  retrograde flow in the distal right A1. Normal anterior communicating artery. Anterior cerebral arteries are patent to their distal aspects without significant stenosis. Appears quite diminutive and does not enhance as well as the left M1, likely reflecting at least moderate stenosis throughout its course. Severe stenosis in 2 proximal right M2 branches (series 9, image 89-90). Occlusion of a small right MCA branch in the anterior temporal lobe (series 9, image 85), without definite reconstitution. Additional multifocal moderate stenosis more distally (series 9, image 69 and 78, for example). No left M1 stenosis or occlusion. Severe stenosis in the insular left M2 (series 9, image 107). Other MCA branches are irregular but do not demonstrate significant stenosis. Poor opacification Posterior circulation: Vertebral arteries patent to the vertebrobasilar junction without significant stenosis. Posterior inferior cerebellar arteries patent proximally. Basilar patent to its distal aspect without significant stenosis. Superior cerebellar arteries patent proximally. Severe stenosis right P1 (series 9, image 98). The right PCA is not opacified past the PCA-posterior communicating artery junction (series 9, image 97). No definite reconstitution of the vessel. The left P 1 segment is patent. A diminutive left posterior communicating artery is patent. The left PCA is perfused to its distal aspect without significant stenosis. PCAs perfused to their distal aspects without significant stenosis. The bilateral posterior communicating arteries are not visualized. Venous sinuses: As permitted by contrast timing, patent. Anatomic variants: None significant. Review of the MIP images confirms the above findings IMPRESSION: 1. Complete occlusion of the right PCA at the P1-P2 junction, without evidence of distal reconstitution, which correlates with acute right PCA territory infarct seen on the same-day MRI. 2. Multifocal atherosclerotic  disease in the right greater than left MCA, with long segment moderate stenosis in the M1, severe stenosis in M2 branches, occlusion of a small right MCA branch in the anterior temporal lobe. Additional multifocal moderate stenosis more distally. Severe stenosis in the left M2, with otherwise irregular but patent left MCA. 3. Occluded or severely stenosed proximal right A1, with likely retrograde flow in the distal right A1. 4. No hemodynamically significant stenosis in the neck. 5. Aortic Atherosclerosis (ICD10-I70.0) and Emphysema (ICD10-J43.9). These findings were discussed by telephone on 09/05/2022 at 1:14 pm with provider DEVON SHAFER . Electronically Signed   By: Wiliam Ke M.D.   On: 09/05/2022 13:15   MR Brain Wo Contrast  Result Date: 09/05/2022 CLINICAL DATA:  Memory changes.  Fall with head injury August 2023. EXAM: MRI HEAD WITHOUT CONTRAST TECHNIQUE: Multiplanar, multiecho pulse sequences of the brain and surrounding structures were obtained without intravenous contrast. COMPARISON:  Head CT 11/19/2021 FINDINGS: Brain: Confluent  restricted diffusion in the right occipital cortex also involving the white matter adjacent to the right temporal horn and atrium and at the thalamocapsular junction, extent respecting the PCA distribution. Brain atrophy especially affecting the right temporal lobe, also seen on prior. Chronic perforator infarct at the right corona radiata. Small vessel ischemic gliosis is mild for age. Vascular: Major flow voids are preserved Skull and upper cervical spine: No focal marrow lesion Sinuses/Orbits: Bilateral cataract resection.  No acute finding. These results will be called to the ordering clinician or representative by the Radiologist Assistant, and communication documented in the PACS or [employer]. IMPRESSION: 1. Acute right PCA distribution infarct. 2. Brain atrophy asymmetrically affecting the anterior right temporal lobe and correlating with the history of  memory loss. Electronically Signed   By: Tiburcio Pea M.D.   On: 09/05/2022 08:26    Scheduled Meds:  aspirin EC  81 mg Oral Daily   atorvastatin  10 mg Oral QHS   clopidogrel  75 mg Oral Daily   enoxaparin (LOVENOX) injection  40 mg Subcutaneous Q24H   insulin aspart  0-20 Units Subcutaneous TID WC   insulin aspart  0-5 Units Subcutaneous QHS   losartan  100 mg Oral Daily   Continuous Infusions:   LOS: 0 days   Marguerita Merles, DO [facility] Available via Epic secure chat 7am-7pm After these hours, please refer to coverage provider listed on [email or web address] 09/06/2022, 6:51 PM

## 2022-09-06 NOTE — Care Management Obs Status (Signed)
MEDICARE OBSERVATION STATUS NOTIFICATION   Patient Details  Name: Dennis Wise MRN: [id number] Date of Birth: 1950/09/06   Medicare Observation Status Notification Given:  Yes    Lawerance Sabal, RN 09/06/2022, 3:45 PM

## 2022-09-06 NOTE — TOC Initial Note (Signed)
Transition of Care [facility]) - Initial/Assessment Note    Patient Details  Name: Dennis Wise MRN: [id number] Date of Birth: 27-Nov-1950  Transition of Care [facility]) CM/SW Contact:    Lawerance Sabal, RN Phone Number: 09/06/2022, 3:49 PM  Clinical Narrative:                  Sherron Monday w patient and wife over the phone. Discussed recommendations by therapis and they would like referral for PT OT SLP to [facility]. This has been done and added to AVS.    Barriers to Discharge: Continued Medical Work up   Patient Goals and CMS Choice Patient states their goals for this hospitalization and ongoing recovery are:: to go home CMS Medicare.gov Compare Post Acute Care list provided to:: Patient Choice offered to / list presented to : Patient      Expected Discharge Plan and Services                         DME Arranged: N/A                    Prior Living Arrangements/Services                       Activities of Daily Living Home Assistive Devices/Equipment: Cane (specify quad or straight) ADL Screening (condition at time of admission) Patient's cognitive ability adequate to safely complete daily activities?: Yes Is the patient deaf or have difficulty hearing?: Yes (HOH right ear) Does the patient have difficulty seeing, even when wearing glasses/contacts?: No Does the patient have difficulty concentrating, remembering, or making decisions?: No Patient able to express need for assistance with ADLs?: Yes Does the patient have difficulty dressing or bathing?: No Independently performs ADLs?: Yes (appropriate for developmental age) Does the patient have difficulty walking or climbing stairs?: No Weakness of Legs: None Weakness of Arms/Hands: None  Permission Sought/Granted                  Emotional Assessment              Admission diagnosis:  Stroke [facility]) [I63.9] Cerebral infarction due to embolism of right posterior cerebral artery (HCC) [I63.431] Patient Active  Problem List   Diagnosis Date Noted   Acute ischemic right PCA stroke (HCC) 09/05/2022   Stroke (HCC) 09/05/2022   Memory change 09/03/2022   Shoulder pain 07/02/2022   [name] (HCC) 03/27/2022   Monoclonal B-cell lymphocytosis 01/15/2022   Weight loss 01/15/2022   Syncope 11/22/2021   Nocturia 08/18/2021   IDA (iron deficiency anemia) 07/24/2021   Thrombocytopenia (HCC) 05/03/2021   Normocytic anemia 04/03/2021   Unstable ankle 09/12/2020   Back pain 09/12/2020   Dupuytren contracture 03/03/2019   Healthcare maintenance 02/03/2017   Pseudophakia, both eyes 07/17/2016   Combined form of senile cataract of both eyes 05/02/2016   Hearing loss 10/20/2014   Obesity (BMI 30-39.9) 10/16/2013   Advance care planning 10/16/2013   Actinic keratosis 10/16/2013   Personal history of colonic adenomas 06/05/2012   Pancytopenia, acquired (HCC) 05/01/2011   Medicare annual wellness visit, subsequent 10/24/2010   FOOT PAIN, RIGHT 01/08/2010   Diabetic retinopathy (HCC) 03/21/2005   Essential hypertension 11/20/1999   GERD 01/19/1989   DM type 2 with diabetic background retinopathy (HCC) 04/21/1988   HYPERCHOLESTEROLEMIA  ([id number]) 1990 04/21/1988   PCP:  [name], MD Pharmacy:   [facility] - [place], [place] -  [address] [place] [address] Phone: [phone] Fax: [phone]  [id number] - [place], [place] - [address] [place] [address] Phone: [phone] Fax: [phone]     Social Determinants of Health (SDOH) Social History: SDOH Screenings   Food Insecurity: No Food Insecurity (09/05/2022)  Housing: Patient Declined (09/05/2022)  Transportation Needs: No Transportation Needs (09/05/2022)  Utilities: Not At Risk (09/05/2022)  Alcohol Screen: Low Risk  (03/05/2021)  Depression (PHQ2-9): High Risk (09/01/2022)  Financial Resource Strain: Low Risk  (08/29/2022)  Physical  Activity: Inactive (08/29/2022)  Social Connections: Socially Isolated (08/29/2022)  Stress: Stress Concern Present (08/29/2022)  Tobacco Use: Low Risk  (09/05/2022)   SDOH Interventions:     Readmission Risk Interventions     No data to display

## 2022-09-06 NOTE — Evaluation (Signed)
Physical Therapy Evaluation Patient Details Name: Dennis Wise MRN: [id number] DOB: April 22, 1950 Today's Date: 09/06/2022  History of Present Illness  Pt is a 72 y/o M presenting to ED on 5/17 with memory issues, unsteady gait, and headache since fall (in March 2024). MRI revealing acute R PCA distribution infarct, sent from PCP office for stroke workup. PMH includes GERD, HLD, HTN, DM2, anemia, MGUS, chronic thrombocytopenia.  Clinical Impression  PTA, pt [place] his [place] and works Counselling psychologist in [employer]. Pt reports hx of 2 recent falls; typically uses cane for mobility. Pt presents with impaired dynamic balance and left visual field deficit/inattention. Pt ambulating hallway distances with a cane and negotiated steps at a supervision-min guard assist level. Difficulty locating room numbers on left, requiring moderate cueing. Instruction provided to pt/pt spouse regarding visual scanning and supervision for mobility and IADL's. Recommend OPPT to address deficits at d/c.     Recommendations for follow up therapy are one component of a multi-disciplinary discharge planning process, led by the attending physician.  Recommendations may be updated based on patient status, additional functional criteria and insurance authorization.  Follow Up Recommendations       Assistance Recommended at Discharge Intermittent Supervision/Assistance  Patient can return home with the following  A little help with walking and/or transfers;A little help with bathing/dressing/bathroom;Assistance with cooking/housework;Direct supervision/assist for medications management;Direct supervision/assist for financial management;Assist for transportation;Help with stairs or ramp for entrance    Equipment Recommendations None recommended by PT  Recommendations for Other Services       Functional Status Assessment Patient has had a recent decline in their functional status and demonstrates the ability to  make significant improvements in function in a reasonable and predictable amount of time.     Precautions / Restrictions Precautions Precautions: Fall Precaution Comments: L inattention Restrictions Weight Bearing Restrictions: No      Mobility  Bed Mobility Overal bed mobility: Modified Independent                  Transfers Overall transfer level: Modified independent Equipment used: Straight cane               General transfer comment: Increased time    Ambulation/Gait Ambulation/Gait assistance: Supervision, Min guard Gait Distance (Feet): 400 Feet Assistive device: Straight cane Gait Pattern/deviations: Step-through pattern, Decreased stride length Gait velocity: decreased     General Gait Details: Slow and steady gait pattern, tendency for hugging towards R wall, supervision-min guard for safety  Stairs Stairs: Yes Stairs assistance: Supervision Stair Management: One rail Right Number of Stairs: 2    Wheelchair Mobility    Modified Rankin (Stroke Patients Only) Modified Rankin (Stroke Patients Only) Pre-Morbid Rankin Score: No significant disability Modified Rankin: Moderately severe disability     Balance Overall balance assessment: Needs assistance Sitting-balance support: Feet supported Sitting balance-Leahy Scale: Good     Standing balance support: Single extremity supported, During functional activity Standing balance-Leahy Scale: Fair                               Pertinent Vitals/Pain Pain Assessment Pain Assessment: No/denies pain    Home Living Family/patient expects to be discharged to:: Private residence Living Arrangements: Spouse/significant other Available Help at Discharge: Family;Available 24 hours/day Type of Home: House Home Access: Stairs to enter Entrance Stairs-Rails: Can reach both Entrance Stairs-Number of Steps: 2   Home Layout: One level;Able to live on  main level with  bedroom/bathroom Home Equipment: Grab bars - tub/shower;Shower Counsellor (2 wheels);Cane - single point;Crutches Additional Comments: Administrator, 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    Prior Function Prior Level of Function : Independent/Modified Independent             Mobility Comments: uses SPC for mobility ADLs Comments: spouse does pt's medications, overall ind with ADLs     Hand Dominance   Dominant Hand: Right    Extremity/Trunk Assessment   Upper Extremity Assessment Upper Extremity Assessment: Defer to OT evaluation    Lower Extremity Assessment Lower Extremity Assessment: Overall WFL for tasks assessed    Cervical / Trunk Assessment Cervical / Trunk Assessment: Normal  Communication   Communication: No difficulties  Cognition Arousal/Alertness: Awake/alert Behavior During Therapy: Flat affect, WFL for tasks assessed/performed Overall Cognitive Status: Impaired/Different from baseline Area of Impairment: Attention, Memory, Following commands, Safety/judgement, Awareness, Problem solving                   Current Attention Level: Selective Memory: Decreased short-term memory Following Commands: Follows one step commands with increased time Safety/Judgement: Decreased awareness of deficits Awareness: Anticipatory Problem Solving: Requires verbal cues, Difficulty sequencing, Slow processing General Comments: Needs reminders for compensating for L visual deficit        General Comments      Exercises     Assessment/Plan    PT Assessment Patient needs continued PT services  PT Problem List Decreased balance;Decreased mobility;Decreased cognition;Decreased safety awareness       PT Treatment Interventions DME instruction;Gait training;Stair training;Functional mobility training;Therapeutic activities;Therapeutic exercise;Balance training;Patient/family education    PT Goals (Current goals can be found in the Care Plan  section)  Acute Rehab PT Goals Patient Stated Goal: to return to work PT Goal Formulation: With patient Time For Goal Achievement: 09/20/22 Potential to Achieve Goals: Good    Frequency Min 4X/week     Co-evaluation               AM-PAC PT "6 Clicks" Mobility  Outcome Measure Help needed turning from your back to your side while in a flat bed without using bedrails?: None Help needed moving from lying on your back to sitting on the side of a flat bed without using bedrails?: None Help needed moving to and from a bed to a chair (including a wheelchair)?: A Little Help needed standing up from a chair using your arms (e.g., wheelchair or bedside chair)?: None Help needed to walk in hospital room?: A Little Help needed climbing 3-5 steps with a railing? : A Little 6 Click Score: 21    End of Session Equipment Utilized During Treatment: Gait belt Activity Tolerance: Patient tolerated treatment well Patient left: in bed;with bed alarm set;with call bell/phone within reach;with family/visitor present Nurse Communication: Mobility status PT Visit Diagnosis: Unsteadiness on feet (R26.81);Difficulty in walking, not elsewhere classified (R26.2)    Time: 1610-9604 PT Time Calculation (min) (ACUTE ONLY): 26 min   Charges:   PT Evaluation $PT Eval Low Complexity: 1 Low PT Treatments $Therapeutic Activity: 8-22 mins        Lillia Pauls, PT, DPT Acute Rehabilitation Services Office [phone]   Norval Morton 09/06/2022, 2:52 PM

## 2022-09-07 ENCOUNTER — Observation Stay (HOSPITAL_BASED_OUTPATIENT_CLINIC_OR_DEPARTMENT_OTHER): Payer: Medicare HMO

## 2022-09-07 DIAGNOSIS — I63531 Cerebral infarction due to unspecified occlusion or stenosis of right posterior cerebral artery: Secondary | ICD-10-CM | POA: Diagnosis not present

## 2022-09-07 DIAGNOSIS — I10 Essential (primary) hypertension: Secondary | ICD-10-CM | POA: Diagnosis not present

## 2022-09-07 DIAGNOSIS — I6389 Other cerebral infarction: Secondary | ICD-10-CM | POA: Diagnosis not present

## 2022-09-07 LAB — ECHOCARDIOGRAM COMPLETE
AR max vel: 2.37 cm2
AV Area VTI: 2.27 cm2
AV Area mean vel: 2.27 cm2
AV Mean grad: 4 mmHg
AV Peak grad: 7.2 mmHg
Ao pk vel: 1.34 m/s
Area-P 1/2: 3.03 cm2
Height: 73 in
S' Lateral: 3.7 cm

## 2022-09-07 LAB — COMPREHENSIVE METABOLIC PANEL
ALT: 18 U/L (ref 0–44)
AST: 20 U/L (ref 15–41)
Albumin: 3.3 g/dL — ABNORMAL LOW (ref 3.5–5.0)
Alkaline Phosphatase: 151 U/L — ABNORMAL HIGH (ref 38–126)
Anion gap: 10 (ref 5–15)
BUN: 23 mg/dL (ref 8–23)
CO2: 25 mmol/L (ref 22–32)
Calcium: 8.9 mg/dL (ref 8.9–10.3)
Chloride: 97 mmol/L — ABNORMAL LOW (ref 98–111)
Creatinine, Ser: 1.33 mg/dL — ABNORMAL HIGH (ref 0.61–1.24)
GFR, Estimated: 56 mL/min — ABNORMAL LOW (ref >60)
Glucose, Bld: 228 mg/dL — ABNORMAL HIGH (ref 70–99)
Potassium: 4.2 mmol/L (ref 3.5–5.1)
Sodium: 132 mmol/L — ABNORMAL LOW (ref 135–145)
Total Bilirubin: 1 mg/dL (ref 0.3–1.2)
Total Protein: 6 g/dL — ABNORMAL LOW (ref 6.5–8.1)

## 2022-09-07 LAB — GLUCOSE, CAPILLARY
Glucose-Capillary: 208 mg/dL — ABNORMAL HIGH (ref 70–99)
Glucose-Capillary: 299 mg/dL — ABNORMAL HIGH (ref 70–99)

## 2022-09-07 LAB — CBC WITH DIFFERENTIAL/PLATELET
Abs Immature Granulocytes: 0.02 10*3/uL (ref 0.00–0.07)
Basophils Absolute: 0 10*3/uL (ref 0.0–0.1)
Basophils Relative: 1 %
Eosinophils Absolute: 0.1 10*3/uL (ref 0.0–0.5)
Eosinophils Relative: 2 %
HCT: 33 % — ABNORMAL LOW (ref 39.0–52.0)
Hemoglobin: 11.3 g/dL — ABNORMAL LOW (ref 13.0–17.0)
Immature Granulocytes: 0 %
Lymphocytes Relative: 19 %
Lymphs Abs: 1 10*3/uL (ref 0.7–4.0)
MCH: 29.7 pg (ref 26.0–34.0)
MCHC: 34.2 g/dL (ref 30.0–36.0)
MCV: 86.6 fL (ref 80.0–100.0)
Monocytes Absolute: 0.6 10*3/uL (ref 0.1–1.0)
Monocytes Relative: 11 %
Neutro Abs: 3.5 10*3/uL (ref 1.7–7.7)
Neutrophils Relative %: 67 %
Platelets: 100 10*3/uL — ABNORMAL LOW (ref 150–400)
RBC: 3.81 MIL/uL — ABNORMAL LOW (ref 4.22–5.81)
RDW: 14.9 % (ref 11.5–15.5)
WBC: 5.3 10*3/uL (ref 4.0–10.5)
nRBC: 0 % (ref 0.0–0.2)

## 2022-09-07 LAB — HEMOGLOBIN A1C
Hgb A1c MFr Bld: 7.8 % — ABNORMAL HIGH (ref 4.8–5.6)
Mean Plasma Glucose: 177.16 mg/dL

## 2022-09-07 LAB — MAGNESIUM: Magnesium: 1.7 mg/dL (ref 1.7–2.4)

## 2022-09-07 LAB — PHOSPHORUS: Phosphorus: 4 mg/dL (ref 2.5–4.6)

## 2022-09-07 MED ORDER — LOSARTAN POTASSIUM 100 MG PO TABS: 100.0000 mg | ORAL_TABLET | Freq: Every day | ORAL | 0 refills | Status: DC

## 2022-09-07 MED ORDER — ACETAMINOPHEN 325 MG PO TABS: 650.0000 mg | ORAL_TABLET | ORAL | 0 refills | Status: AC | PRN

## 2022-09-07 MED ORDER — SENNOSIDES-DOCUSATE SODIUM 8.6-50 MG PO TABS: 1.0000 | ORAL_TABLET | Freq: Every evening | ORAL | 0 refills | Status: DC | PRN

## 2022-09-07 MED ORDER — CLOPIDOGREL BISULFATE 75 MG PO TABS: 75.0000 mg | ORAL_TABLET | Freq: Every day | ORAL | 0 refills | Status: DC

## 2022-09-07 MED ORDER — ASPIRIN 81 MG PO TBEC: 81.0000 mg | DELAYED_RELEASE_TABLET | Freq: Every day | ORAL | 2 refills | Status: DC

## 2022-09-07 NOTE — Discharge Summary (Signed)
Physician Discharge Summary   Patient: Dennis Wise MRN: [id number] DOB: 1951/04/20  Admit date:     09/05/2022  Discharge date: 09/07/2022  Discharge Physician: Marguerita Merles, DO   PCP: [name], MD   Recommendations at discharge:   Follow-up with PCP within 1 to 2 weeks repeat CBC, CMP, mag, Phos within 1 week With neurology in outpatient setting in about 4 weeks and continue dual antiplatelet therapy for 3 months  Discharge Diagnoses: Principal Problem:   Stroke [facility]) Active Problems:   Essential hypertension   Acute ischemic right PCA stroke (HCC)  Resolved Problems:   * No resolved hospital problems. *  Hospital Course: Patient is a 72 year old Caucasian obese male with a past medical history significant for but limited to diabetes mellitus type 2, hypertension, hyperlipidemia, MGUS, chronic thrombocytopenia as well as other comorbidities who was sent from his PCP office for stroke workup. Patient had s symptoms on March 21 after he sustained a mechanical fall and hit his left shoulder and rib cage. He had no fractures or dislocation and since then the patient had episodes of clamminess, unsteady gait and noted 3 episodes of falls at home at different times. They also reported the patient appeared to have some memory issues after initial event monitor and also stated that he had started decreased short-term memory and complained of worsening vision. He denies any numbness, weakness of his limbs denied choking or cough or swallowing problems. He went to see his PCP who ordered an outpatient MRI which was positive for acute right PCA stroke and PCP asked the patient to come to the ED right away. Patient's family has also reported poorly controlled blood pressure and glucose at home recently. Given his acute right PCA stroke neurology was consulted and he is undergoing stroke workup. Currently stroke workup is nearly complete but pending echocardiogram.   Assessment and  Plan:  Acute left PCA stroke -With symptoms of ataxia and left visual neglect and short memory impairment -Neurology consultation appreciated, agreed with aspirin plus Plavix regimen recommending it for 3 months given that they felt that the etiology is large vessel disease and then Plavix Monotherapy  -CT head showed a hypodensity right PCA territory -CTA of the head and neck showed complete occlusion of the right PCA and focal atherosclerotic disease in the right greater than left MCA severe stenosis of the M2 branches, occlusion of the small right MCA branch and anterior temporal lobe stenosis and left M2 and occluded or severely stenosed A1 -MRI showed acute right PCA infarct -Likely symptoms onset more than 48 hours, out of window for permissive hypertension, will increase his blood pressure meds losartan to 100 mg daily, add as needed hydralazine -Continue atorvastatin 10 mg p.o. daily given that his LDL is at goal of 59 -History does not have permissive hypertension but now neurology has restarted his blood pressure medications -Most recent A1c 7.8 -Echocardiogram ordered and done  and showed:  "Left ventricular ejection fraction, by estimation, is 65 to 70%. The left ventricle has normal function. The left ventricle has no regional wall motion abnormalities. Left ventricular diastolic parameters are consistent with Grade II diastolic  dysfunction (pseudonormalization).   2. Right ventricular systolic function is normal. The right ventricular size is mildly enlarged. Tricuspid regurgitation signal is inadequate for assessing PA pressure.   3. Left atrial size was mildly dilated.   4. The mitral valve is grossly normal. Trivial mitral valve  regurgitation. No evidence of mitral stenosis.  5. Focal calcification present on the NCC. The aortic valve is tricuspid. There is mild calcification of the aortic valve. Aortic valve regurgitation is not visualized. Aortic valve sclerosis is present,  with no evidence of aortic valve stenosis.   6. The inferior vena cava is normal in size with greater than 50% respiratory variability, suggesting right atrial pressure of 3 mmHg.   Conclusion(s)/Recommendation(s): No intracardiac source of embolism detected on this transthoracic study. Consider a transesophageal echocardiogram to exclude cardiac source of embolism if clinically Indicated."  -PT evaluation recommending outpatient physical therapy Occupational Therapy and SLP and patient would like to do this at [facility]   HTN, uncontrolled -As above -Home medications include losartan and this has been increased to 100 mg p.o. daily -Neurology did recommend continuing permissive hypertension and blood pressure is okay less than 220/120 and normalizing in 5 to 7 days -Continue to monitor blood pressures per protocol -Last blood pressure reading was little elevated at 155/53   IIDM with hyperglycemia -Appears to be fairly controlled by most recent A1c in March of 7.8 -Hold off home diabetic medication including metformin Actos and glimepiride, start sliding scale -CBG Trend:  Recent Labs  Lab 09/05/22 2036 09/06/22 0620 09/06/22 1123 09/06/22 1635 09/06/22 2010 09/07/22 0656 09/07/22 1451  GLUCAP 246* 151* 165* 169* 149* 208* 299*    MGUS and chronic thrombocytopenia -Platelet Count Trend: Recent Labs  Lab 09/05/22 1050 09/06/22 1141 09/07/22 0409  PLT 116* 118* 100*  -Has been followed with oncology and considered to be stable on recent office visit   Hyponatremia -Mild and Na+ Trend: Recent Labs  Lab 09/05/22 1050 09/05/22 1100 09/06/22 1141 09/07/22 0409  NA 132* 132* 133* 132*  -Will need to monitor and trend and repeat CMP within 1 week   CKD stage IIIa -BUN/Cr Trend: Recent Labs  Lab 09/05/22 1050 09/05/22 1100 09/06/22 1141 09/07/22 0409  BUN 24* 26* 22 23  CREATININE 1.36* 1.30* 1.28* 1.33*  -Avoid Nephrotoxic Medications, Contrast Dyes, Hypotension and  Dehydration to Ensure Adequate Renal Perfusion and will need to Renally Adjust Meds -Continue to Monitor and Trend Renal Function carefully and repeat CMP in the AM    Normocytic Anemia -Hgb/Hct Trend: Recent Labs  Lab 09/05/22 1050 09/05/22 1100 09/06/22 1141 09/07/22 0409  HGB 11.8* 11.6* 12.0* 11.3*  HCT 34.5* 34.0* 34.9* 33.0*  MCV 88.2  --  87.5 86.6  -Check Anemia Panel in the outpatient setting  -Continue care for signs and symptoms of bleeding; no overt bleeding noted -Repeat CBC in a.m.   Obesity -Complicates overall prognosis and care -Estimated body mass index is 33.74 kg/m as calculated from the following:   Height as of this encounter: 6\' 1"  (1.854 m).   Weight as of this encounter: 116 kg.  -Weight Loss and Dietary Counseling given  Consultants: Neurology Procedures performed: ECHOCARDIOGRAM  Disposition: Home with outpatient PT/OT Diet recommendation:  Discharge Diet Orders (From admission, onward)     Start     Ordered   09/07/22 0000  Diet - low sodium heart healthy        09/07/22 1520   09/07/22 0000  Diet Carb Modified        09/07/22 1520           Cardiac and Carb modified diet DISCHARGE MEDICATION: Allergies as of 09/07/2022       Reactions   Promethazine Hcl    REACTION: AGITIATION        Medication List  STOP taking these medications    aspirin 81 MG tablet Replaced by: aspirin EC 81 MG tablet       TAKE these medications    Accu-Chek FastClix Lancets Misc Use to test blood sugar once daily or as needed.  Diagnosis:  E11.3299  Non insulin dependent.   Accu-Chek Nano SmartView w/Device Kit Use to check blood sugar once daily or as needed.  Diagnosis: E11.3299  Non insulin dependent.   acetaminophen 325 MG tablet Commonly known as: TYLENOL Take 2 tablets (650 mg total) by mouth every 4 (four) hours as needed for mild pain (or temp > 37.5 C (99.5 F)).   aspirin EC 81 MG tablet Take 1 tablet (81 mg total) by mouth  daily. Swallow whole. STOP after 90 Days and then continue Plavix Monotherapy Start taking on: Sep 08, 2022 Replaces: aspirin 81 MG tablet   atorvastatin 10 MG tablet Commonly known as: LIPITOR TAKE 1 TABLET AT BEDTIME   blood glucose meter kit and supplies Kit Dispense based on patient and insurance preference. Use up to four times daily as directed. (FOR ICD-9 250.00, 250.01).   clopidogrel 75 MG tablet Commonly known as: PLAVIX Take 1 tablet (75 mg total) by mouth daily. Start taking on: Sep 08, 2022   glimepiride 2 MG tablet Commonly known as: AMARYL TAKE 1 TABLET EVERY DAY   losartan 100 MG tablet Commonly known as: COZAAR Take 1 tablet (100 mg total) by mouth daily. Start taking on: Sep 08, 2022 What changed:  medication strength how much to take   metFORMIN 850 MG tablet Commonly known as: GLUCOPHAGE TAKE 1 TABLET TWICE DAILY   multivitamin tablet Take 1 tablet by mouth daily. Centrum Silver   niacin 500 MG ER tablet Commonly known as: VITAMIN B3 Take 3 tablets (1,500 mg total) by mouth daily.   pioglitazone 45 MG tablet Commonly known as: ACTOS TAKE 1 TABLET EVERY DAY   senna-docusate 8.6-50 MG tablet Commonly known as: Senokot-S Take 1 tablet by mouth at bedtime as needed for mild constipation.        Follow-up Information     [facility] Follow up.   Specialty: Rehabilitation Why: for Speech, physical and occupational therapies Contact information: [address] [id number] [address] [place] [address] [phone]               Discharge Exam: Filed Weights   09/05/22 1657  Weight: 116 kg   Vitals:   09/07/22 1454 09/07/22 1457  BP: (!) 152/49   Pulse: (!) 50 (!) 55  Resp: 16   Temp: 99.2 F (37.3 C)   SpO2: 99% 98%   Examination: Physical Exam:  Constitutional: WN/WD bees Caucasian male in no acute distress appears calm Respiratory: Diminished to auscultation  bilaterally, no wheezing, rales, rhonchi or crackles. Normal respiratory effort and patient is not tachypenic. No accessory muscle use.  Unlabored breathing Cardiovascular: RRR, no murmurs / rubs / gallops. S1 and S2 auscultated. No extremity edema. Abdomen: Soft, non-tender, distended secondary to body habitus. Bowel sounds positive.  GU: Deferred. Musculoskeletal: No clubbing / cyanosis of digits/nails. No joint deformity upper and lower extremities.  Skin: No rashes, lesions, ulcers on a limited skin evaluation. No induration; Warm and dry.  Neurologic: CN 2-12 grossly intact with no focal deficits. Romberg sign and cerebellar reflexes not assessed.  Psychiatric: Normal judgment and insight. Alert and oriented x 3. Normal mood and appropriate affect.   Condition at discharge: stable  The results of significant diagnostics from this hospitalization (including imaging, microbiology, ancillary and laboratory) are listed below for reference.   Imaging Studies: ECHOCARDIOGRAM COMPLETE  Result Date: 09/07/2022    ECHOCARDIOGRAM REPORT   Patient Name:   Dennis Wise [facility] Date of Exam: 09/07/2022 Medical Rec #:  [id number]    Height:       73.0 in Accession #:    [id number]   Weight:       255.7 lb Date of Birth:  04/02/1951    BSA:          2.389 m Patient Age:    72 years     BP:           155/53 mmHg Patient Gender: M            HR:           67 bpm. Exam Location:  Inpatient Procedure: 2D Echo, Cardiac Doppler and Color Doppler Indications:    Stroke I63.9  History:        Patient has no prior history of Echocardiogram examinations and                 Patient has prior history of Echocardiogram examinations, most                 recent 10/15/2021. Stroke; Risk Factors:Hypertension, Diabetes,                 Dyslipidemia and Non-Smoker.  Sonographer:    Dondra Prader RVT RCS Referring Phys: [id number] DEVON SHAFER IMPRESSIONS  1. Left ventricular ejection fraction, by estimation, is 65 to 70%. The left ventricle  has normal function. The left ventricle has no regional wall motion abnormalities. Left ventricular diastolic parameters are consistent with Grade II diastolic dysfunction (pseudonormalization).  2. Right ventricular systolic function is normal. The right ventricular size is mildly enlarged. Tricuspid regurgitation signal is inadequate for assessing PA pressure.  3. Left atrial size was mildly dilated.  4. The mitral valve is grossly normal. Trivial mitral valve regurgitation. No evidence of mitral stenosis.  5. Focal calcification present on the NCC. The aortic valve is tricuspid. There is mild calcification of the aortic valve. Aortic valve regurgitation is not visualized. Aortic valve sclerosis is present, with no evidence of aortic valve stenosis.  6. The inferior vena cava is normal in size with greater than 50% respiratory variability, suggesting right atrial pressure of 3 mmHg. Conclusion(s)/Recommendation(s): No intracardiac source of embolism detected on this transthoracic study. Consider a transesophageal echocardiogram to exclude cardiac source of embolism if clinically indicated. FINDINGS  Left Ventricle: Left ventricular ejection fraction, by estimation, is 65 to 70%. The left ventricle has normal function. The left ventricle has no regional wall motion abnormalities. The left ventricular internal cavity size was normal in size. There is  no left ventricular hypertrophy. Left ventricular diastolic parameters are consistent with Grade II diastolic dysfunction (pseudonormalization). Right Ventricle: The right ventricular size is mildly enlarged. No increase in right ventricular wall thickness. Right ventricular systolic function is normal. Tricuspid regurgitation signal is inadequate for assessing PA pressure. Left Atrium: Left atrial size was mildly dilated. Right Atrium: Right atrial size was normal in size. Pericardium: Trivial pericardial effusion is present. Mitral Valve: The mitral valve is grossly  normal. Trivial mitral valve regurgitation. No evidence of mitral valve stenosis. Tricuspid Valve: The tricuspid valve is grossly normal. Tricuspid valve regurgitation is trivial. No evidence of tricuspid stenosis. Aortic Valve: Focal calcification present on the NCC.  The aortic valve is tricuspid. There is mild calcification of the aortic valve. Aortic valve regurgitation is not visualized. Aortic valve sclerosis is present, with no evidence of aortic valve stenosis. Aortic valve mean gradient measures 4.0 mmHg. Aortic valve peak gradient measures 7.2 mmHg. Aortic valve area, by VTI measures 2.27 cm. Pulmonic Valve: The pulmonic valve was grossly normal. Pulmonic valve regurgitation is not visualized. No evidence of pulmonic stenosis. Aorta: The aortic root and ascending aorta are structurally normal, with no evidence of dilitation. Venous: The inferior vena cava is normal in size with greater than 50% respiratory variability, suggesting right atrial pressure of 3 mmHg. IAS/Shunts: The atrial septum is grossly normal.  LEFT VENTRICLE PLAX 2D LVIDd:         5.80 cm   Diastology LVIDs:         3.70 cm   LV e' medial:    4.60 cm/s LV PW:         1.00 cm   LV E/e' medial:  19.6 LV IVS:        1.00 cm   LV e' lateral:   8.52 cm/s LVOT diam:     1.80 cm   LV E/e' lateral: 10.6 LV SV:         70 LV SV Index:   30 LVOT Area:     2.54 cm  RIGHT VENTRICLE             IVC RV Basal diam:  4.10 cm     IVC diam: 1.90 cm RV S prime:     17.80 cm/s TAPSE (M-mode): 2.4 cm LEFT ATRIUM           Index        RIGHT ATRIUM           Index LA diam:      4.40 cm 1.84 cm/m   RA Area:     19.90 cm LA Vol (A4C): 84.2 ml 35.27 ml/m  RA Volume:   62.20 ml  26.04 ml/m  AORTIC VALVE                    PULMONIC VALVE AV Area (Vmax):    2.37 cm     PV Vmax:       1.23 m/s AV Area (Vmean):   2.27 cm     PV Peak grad:  6.1 mmHg AV Area (VTI):     2.27 cm AV Vmax:           134.00 cm/s AV Vmean:          92.400 cm/s AV VTI:            0.310  m AV Peak Grad:      7.2 mmHg AV Mean Grad:      4.0 mmHg LVOT Vmax:         125.00 cm/s LVOT Vmean:        82.300 cm/s LVOT VTI:          0.277 m LVOT/AV VTI ratio: 0.89  AORTA Ao Root diam: 3.30 cm Ao Asc diam:  3.50 cm MITRAL VALVE MV Area (PHT): 3.03 cm    SHUNTS MV Decel Time: 250 msec    Systemic VTI:  0.28 m MV E velocity: 90.10 cm/s  Systemic Diam: 1.80 cm MV A velocity: 66.10 cm/s MV E/A ratio:  1.36 Lennie Odor MD Electronically signed by Lennie Odor MD Signature Date/Time: 09/07/2022/2:22:07 PM    Final  CT ANGIO HEAD NECK W WO CM  Result Date: 09/05/2022 CLINICAL DATA:  Acute infarct on same-day MRI EXAM: CT ANGIOGRAPHY HEAD AND NECK WITH AND WITHOUT CONTRAST TECHNIQUE: Multidetector CT imaging of the head and neck was performed using the standard protocol during bolus administration of intravenous contrast. Multiplanar CT image reconstructions and MIPs were obtained to evaluate the vascular anatomy. Carotid stenosis measurements (when applicable) are obtained utilizing NASCET criteria, using the distal internal carotid diameter as the denominator. RADIATION DOSE REDUCTION: This exam was performed according to the departmental dose-optimization program which includes automated exposure control, adjustment of the mA and/or kV according to patient size and/or use of iterative reconstruction technique. CONTRAST:  50mL OMNIPAQUE IOHEXOL 350 MG/ML SOLN COMPARISON:  MRI head 09/05/2022, CT head 11/19/2021, no prior CTA FINDINGS: CT HEAD FINDINGS Brain: Hypodensity in the right PCA territory, involving right occipital lobe and posterior right temporal lobe, extending to the posterior aspect of the thalamus and posterior limb the internal capsule. No evidence of acute hemorrhage, mass, mass effect, or midline shift. No hydrocephalus or extra-axial fluid collection. Enlargement of the right temporal horn, suggesting disproportionate right temporal lobe atrophy. Remote infarcts in the right basal ganglia  and corona radiata. Vascular: Please see CTA findings below. Skull: Negative for fracture or focal lesion. Sinuses/Orbits: Mild mucosal thickening in the right maxillary sinus. Status post bilateral lens replacements. Other: The mastoid air cells are well aerated. CTA NECK FINDINGS Aortic arch: Four-vessel arch, with the left vertebral artery originating from the aorta. Imaged portion shows no evidence of aneurysm or dissection. No significant stenosis of the major arch vessel origins. Mild aortic atherosclerosis. Right carotid system: No evidence of dissection, occlusion, or hemodynamically significant stenosis (greater than 50%). Left carotid system: No evidence of dissection, occlusion, or hemodynamically significant stenosis (greater than 50%). Vertebral arteries: No evidence of dissection, occlusion, or hemodynamically significant stenosis (greater than 50%). Skeleton: No acute osseous abnormality. Degenerative changes in the cervical spine. Other neck: No acute finding. Upper chest: Emphysema. No focal pulmonary opacity or pleural effusion. Review of the MIP images confirms the above findings CTA HEAD FINDINGS Evaluation is somewhat limited by bolus timing. Anterior circulation: Both internal carotid arteries are patent to the termini, without significant stenosis. Patent left A1. Occluded or severely stenosed proximal right A1 (series 9, image 94), with likely retrograde flow in the distal right A1. Normal anterior communicating artery. Anterior cerebral arteries are patent to their distal aspects without significant stenosis. Appears quite diminutive and does not enhance as well as the left M1, likely reflecting at least moderate stenosis throughout its course. Severe stenosis in 2 proximal right M2 branches (series 9, image 89-90). Occlusion of a small right MCA branch in the anterior temporal lobe (series 9, image 85), without definite reconstitution. Additional multifocal moderate stenosis more distally  (series 9, image 69 and 78, for example). No left M1 stenosis or occlusion. Severe stenosis in the insular left M2 (series 9, image 107). Other MCA branches are irregular but do not demonstrate significant stenosis. Poor opacification Posterior circulation: Vertebral arteries patent to the vertebrobasilar junction without significant stenosis. Posterior inferior cerebellar arteries patent proximally. Basilar patent to its distal aspect without significant stenosis. Superior cerebellar arteries patent proximally. Severe stenosis right P1 (series 9, image 98). The right PCA is not opacified past the PCA-posterior communicating artery junction (series 9, image 97). No definite reconstitution of the vessel. The left P 1 segment is patent. A diminutive left posterior communicating artery is patent. The left  PCA is perfused to its distal aspect without significant stenosis. PCAs perfused to their distal aspects without significant stenosis. The bilateral posterior communicating arteries are not visualized. Venous sinuses: As permitted by contrast timing, patent. Anatomic variants: None significant. Review of the MIP images confirms the above findings IMPRESSION: 1. Complete occlusion of the right PCA at the P1-P2 junction, without evidence of distal reconstitution, which correlates with acute right PCA territory infarct seen on the same-day MRI. 2. Multifocal atherosclerotic disease in the right greater than left MCA, with long segment moderate stenosis in the M1, severe stenosis in M2 branches, occlusion of a small right MCA branch in the anterior temporal lobe. Additional multifocal moderate stenosis more distally. Severe stenosis in the left M2, with otherwise irregular but patent left MCA. 3. Occluded or severely stenosed proximal right A1, with likely retrograde flow in the distal right A1. 4. No hemodynamically significant stenosis in the neck. 5. Aortic Atherosclerosis (ICD10-I70.0) and Emphysema (ICD10-J43.9).  These findings were discussed by telephone on 09/05/2022 at 1:14 pm with provider DEVON SHAFER . Electronically Signed   By: Wiliam Ke M.D.   On: 09/05/2022 13:15   MR Brain Wo Contrast  Result Date: 09/05/2022 CLINICAL DATA:  Memory changes.  Fall with head injury August 2023. EXAM: MRI HEAD WITHOUT CONTRAST TECHNIQUE: Multiplanar, multiecho pulse sequences of the brain and surrounding structures were obtained without intravenous contrast. COMPARISON:  Head CT 11/19/2021 FINDINGS: Brain: Confluent restricted diffusion in the right occipital cortex also involving the white matter adjacent to the right temporal horn and atrium and at the thalamocapsular junction, extent respecting the PCA distribution. Brain atrophy especially affecting the right temporal lobe, also seen on prior. Chronic perforator infarct at the right corona radiata. Small vessel ischemic gliosis is mild for age. Vascular: Major flow voids are preserved Skull and upper cervical spine: No focal marrow lesion Sinuses/Orbits: Bilateral cataract resection.  No acute finding. These results will be called to the ordering clinician or representative by the Radiologist Assistant, and communication documented in the PACS or [employer]. IMPRESSION: 1. Acute right PCA distribution infarct. 2. Brain atrophy asymmetrically affecting the anterior right temporal lobe and correlating with the history of memory loss. Electronically Signed   By: Tiburcio Pea M.D.   On: 09/05/2022 08:26   [place] ELASTOGRAPHY LIVER  Result Date: 08/12/2022 CLINICAL DATA:  Prior abnormal ultrasound of liver EXAM: [place] ELASTOGRAPHY HEPATIC TECHNIQUE: Sonography of the liver was performed. In addition, ultrasound elastography evaluation of the liver was performed. A region of interest was placed within the right lobe of the liver. Following application of a compressive sonographic pulse, tissue compressibility was assessed. Multiple assessments were performed at the  selected site. Median tissue compressibility was determined. Previously, hepatic stiffness was assessed by shear wave velocity. Based on recently published Society of Radiologists in Ultrasound consensus article, reporting is now recommended to be performed in the SI units of pressure (kiloPascals) representing hepatic stiffness/elasticity. The obtained result is compared to the published reference standards. (cACLD = compensated Advanced Chronic Liver Disease) COMPARISON:  04/23/2021 FINDINGS: Liver: Echogenic parenchyma, likely fatty infiltration though this can be seen with cirrhosis and certain infiltrative disorders. No focal hepatic mass or nodularity. No intrahepatic biliary dilatation. Portal vein is patent on color Doppler imaging with normal direction of blood flow towards the liver. ULTRASOUND HEPATIC ELASTOGRAPHY Device: Siemens Helix VTQ Patient position: Oblique Transducer: 9C2 Number of measurements: 12 Hepatic segment:  8 Median kPa: 3.8 IQR: 1.1 IQR/Median kPa ratio: 0.29 Data quality:  Good Diagnostic category:  < or = 5 kPa: high probability of being normal The use of hepatic elastography is applicable to patients with viral hepatitis and non-alcoholic fatty liver disease. At this time, there is insufficient data for the referenced cut-off values and use in other causes of liver disease, including alcoholic liver disease. Patients, however, may be assessed by elastography and serve as their own reference standard/baseline. In patients with non-alcoholic liver disease, the values suggesting compensated advanced chronic liver disease (cACLD) may be lower, and patients may need additional testing with elasticity results of 7-9 kPa. Please note that abnormal hepatic elasticity and shear wave velocities may also be identified in clinical settings other than with hepatic fibrosis, such as: acute hepatitis, elevated right heart and central venous pressures including use of beta blockers, veno-occlusive  disease (Budd-Chiari), infiltrative processes such as mastocytosis/amyloidosis/infiltrative tumor/lymphoma, extrahepatic cholestasis, with hyperemia in the post-prandial state, and with liver transplantation. Correlation with patient history, laboratory data, and clinical condition recommended. Diagnostic Categories: < or =5 kPa: high probability of being normal < or =9 kPa: in the absence of other known clinical signs, rules out cACLD >9 kPa and ?13 kPa: suggestive of cACLD, but needs further testing >13 kPa: highly suggestive of cACLD > or =17 kPa: highly suggestive of cACLD with an increased probability of clinically significant portal hypertension IMPRESSION: ULTRASOUND LIVER: Probable mild fatty infiltration of liver. No additional hepatic sonographic abnormalities. ULTRASOUND HEPATIC ELASTOGRAPHY: Median kPa:  3.8 Diagnostic category:  < or = 5 kPa: high probability of being normal Electronically Signed   By: Ulyses Southward M.D.   On: 08/12/2022 11:21    Microbiology: Results for orders placed or performed in visit on 01/16/21  WOUND CULTURE     Status: Abnormal   Collection Time: 01/16/21 12:38 PM   Specimen: Wound  Result Value Ref Range Status   MICRO NUMBER: [id number]  Final   SPECIMEN QUALITY: Adequate  Final   SOURCE: NOT GIVEN  Final   STATUS: FINAL  Final   GRAM STAIN:   Final    No epithelial cells seen Few Polymorphonuclear leukocytes Many Gram positive cocci   ISOLATE 1: Staphylococcus aureus (A)  Final    Comment: Heavy growth of Staphylococcus aureus   COMMENT:   Final    No source was provided. The specimen was tested and reported based upon the test code ordered. If this is incorrect, please contact client services.      Susceptibility   Staphylococcus aureus - AEROBIC CULT, GRAM STAIN POSITIVE 1    VANCOMYCIN <=0.5 Sensitive     CIPROFLOXACIN <=0.5 Sensitive     CLINDAMYCIN <=0.25 Sensitive     LEVOFLOXACIN 0.25 Sensitive     ERYTHROMYCIN <=0.25 Sensitive     GENTAMICIN  <=0.5 Sensitive     OXACILLIN* 0.5 Sensitive      * Oxacillin susceptible staphylococci are susceptible to other penicillinase-stable penicillins (e.g., methicillin, nafcillin), beta- lactam/beta-lactamase inhibitor combinations, and cephems with staphylococcal indications, including cefazolin.     TETRACYCLINE <=1 Sensitive     TRIMETH/SULFA* <=10 Sensitive      * Oxacillin susceptible staphylococci are susceptible to other penicillinase-stable penicillins (e.g., methicillin, nafcillin), beta- lactam/beta-lactamase inhibitor combinations, and cephems with staphylococcal indications, including cefazolin. Legend: S = Susceptible  I = Intermediate R = Resistant  NS = Not susceptible * = Not tested  NR = Not reported **NN = See antimicrobic comments     Labs: CBC: Recent Labs  Lab 09/05/22 1050 09/05/22 1100 09/06/22  1141 09/07/22 0409  WBC 6.3  --  6.2 5.3  NEUTROABS 4.6  --  4.6 3.5  HGB 11.8* 11.6* 12.0* 11.3*  HCT 34.5* 34.0* 34.9* 33.0*  MCV 88.2  --  87.5 86.6  PLT 116*  --  118* 100*   Basic Metabolic Panel: Recent Labs  Lab 09/05/22 1050 09/05/22 1100 09/06/22 1141 09/07/22 0409  NA 132* 132* 133* 132*  K 4.1 4.1 4.2 4.2  CL 98 98 97* 97*  CO2 23  --  24 25  GLUCOSE 233* 238* 172* 228*  BUN 24* 26* 22 23  CREATININE 1.36* 1.30* 1.28* 1.33*  CALCIUM 9.1  --  9.1 8.9  MG  --   --  1.7 1.7  PHOS  --   --  3.3 4.0   Liver Function Tests: Recent Labs  Lab 09/05/22 1050 09/06/22 1141 09/07/22 0409  AST 24 21 20   ALT 18 17 18   ALKPHOS 204* 159* 151*  BILITOT 0.5 0.9 1.0  PROT 6.6 6.5 6.0*  ALBUMIN 3.6 3.5 3.3*   CBG: Recent Labs  Lab 09/06/22 1123 09/06/22 1635 09/06/22 2010 09/07/22 0656 09/07/22 1451  GLUCAP 165* 169* 149* 208* 299*   Discharge time spent: greater than 30 minutes.  Signed: Marguerita Merles, DO [facility] 09/07/2022

## 2022-09-07 NOTE — Significant Event (Signed)
Reviewed AVS with patient and his spouse. All questions answered. A copy of AVS given to them to take home. All personal belongings taken with them to home. Taken to transportation by [employer] via wheelchair.

## 2022-09-08 ENCOUNTER — Other Ambulatory Visit: Payer: Medicare HMO

## 2022-09-09 ENCOUNTER — Encounter: Payer: Self-pay | Admitting: Oncology

## 2022-09-11 ENCOUNTER — Encounter: Payer: Self-pay | Admitting: Family Medicine

## 2022-09-15 ENCOUNTER — Other Ambulatory Visit: Payer: Self-pay | Admitting: Family Medicine

## 2022-09-15 DIAGNOSIS — Z8673 Personal history of transient ischemic attack (TIA), and cerebral infarction without residual deficits: Secondary | ICD-10-CM

## 2022-09-16 ENCOUNTER — Telehealth: Payer: Self-pay | Admitting: Pharmacist

## 2022-09-16 ENCOUNTER — Ambulatory Visit: Payer: Medicare HMO | Admitting: Family Medicine

## 2022-09-16 DIAGNOSIS — I10 Essential (primary) hypertension: Secondary | ICD-10-CM

## 2022-09-16 DIAGNOSIS — I6389 Other cerebral infarction: Secondary | ICD-10-CM

## 2022-09-16 NOTE — Telephone Encounter (Signed)
PharmD reviewed patient chart to assess eligibility for [facility] services. Patient was determined to be a good candidate for the program given the complexity of the medication regimen and/or overall risk for hospitalization and increased utilization.  Referral entered in order to outreach patient and offer appointment with PharmD. Referral cosigned to PCP.  

## 2022-09-17 ENCOUNTER — Ambulatory Visit: Payer: Medicare HMO

## 2022-09-17 ENCOUNTER — Encounter: Payer: Self-pay | Admitting: Physician Assistant

## 2022-09-17 ENCOUNTER — Ambulatory Visit: Payer: Medicare HMO | Admitting: Physician Assistant

## 2022-09-17 VITALS — BP 124/78 | HR 78 | Resp 18 | Ht 73.0 in | Wt 246.0 lb

## 2022-09-17 DIAGNOSIS — I63531 Cerebral infarction due to unspecified occlusion or stenosis of right posterior cerebral artery: Secondary | ICD-10-CM | POA: Diagnosis not present

## 2022-09-17 DIAGNOSIS — R413 Other amnesia: Secondary | ICD-10-CM

## 2022-09-17 MED ORDER — MEMANTINE HCL 5 MG PO TABS: 5.0000 mg | ORAL_TABLET | Freq: Every evening | ORAL | 11 refills | Status: DC

## 2022-09-17 NOTE — Patient Instructions (Addendum)
It was a pleasure to see you today at our office.   Recommendations:  Neurocognitive evaluation at our office   Follow up in 6 month  Start Memantine 5mg  tablets.  Take 1 tablet at bedtime      For assessment of decision of mental capacity and competency:  Call Dr. Erick Blinks, geriatric psychiatrist at [phone] Counseling regarding caregiver distress, including caregiver depression, anxiety and issues regarding community resources, adult day care programs, adult living facilities, or memory care questions:  please contact your  Primary Doctor's Social Worker  Whom to call: Memory  decline, memory medications: Call our office [phone]  For psychiatric meds, mood meds: Please have your primary care physician manage these medications.  If you have any severe symptoms of a stroke, or other severe issues such as confusion,severe chills or fever, etc call 911 or go to the ER as you may need to be evaluated further    RECOMMENDATIONS FOR ALL PATIENTS WITH MEMORY PROBLEMS: 1. Continue to exercise (Recommend 30 minutes of walking everyday, or 3 hours every week) 2. Increase social interactions - continue going to [place] and enjoy social gatherings with friends and family 3. Eat healthy, avoid fried foods and eat more fruits and vegetables 4. Maintain adequate blood pressure, blood sugar, and blood cholesterol level. Reducing the risk of stroke and cardiovascular disease also helps promoting better memory. 5. Avoid stressful situations. Live a simple life and avoid aggravations. Organize your time and prepare for the next day in anticipation. 6. Sleep well, avoid any interruptions of sleep and avoid any distractions in the bedroom that may interfere with adequate sleep quality 7. Avoid sugar, avoid sweets as there is a strong link between excessive sugar intake, diabetes, and cognitive impairment We discussed the Mediterranean diet, which has been shown to help patients reduce the risk  of progressive memory disorders and reduces cardiovascular risk. This includes eating fish, eat fruits and green leafy vegetables, nuts like almonds and hazelnuts, walnuts, and also use olive oil. Avoid fast foods and fried foods as much as possible. Avoid sweets and sugar as sugar use has been linked to worsening of memory function.  There is always a concern of gradual progression of memory problems. If this is the case, then we may need to adjust level of care according to patient needs. Support, both to the patient and caregiver, should then be put into place.      You have been referred for a neuropsychological evaluation (i.e., evaluation of memory and thinking abilities). Please bring someone with you to this appointment if possible, as it is helpful for the doctor to hear from both you and another adult who knows you well. Please bring eyeglasses and hearing aids if you wear them.    The evaluation will take approximately 3 hours and has two parts:   The first part is a clinical interview with the neuropsychologist (Dr. Milbert Coulter or Dr. Roseanne Reno). During the interview, the neuropsychologist will speak with you and the individual you brought to the appointment.    The second part of the evaluation is testing with the doctor's technician Annabelle Harman or Selena Batten). During the testing, the technician will ask you to remember different types of material, solve problems, and answer some questionnaires. Your family member will not be present for this portion of the evaluation.   Please note: We must reserve several hours of the neuropsychologist's time and the psychometrician's time for your evaluation appointment. As such, there is a No-Show fee of $  100. If you are unable to attend any of your appointments, please contact our office as soon as possible to reschedule.    FALL PRECAUTIONS: Be cautious when walking. Scan the area for obstacles that may increase the risk of trips and falls. When getting up in the  mornings, sit up at the edge of the bed for a few minutes before getting out of bed. Consider elevating the bed at the head end to avoid drop of blood pressure when getting up. Walk always in a well-lit room (use night lights in the walls). Avoid area rugs or power cords from appliances in the middle of the walkways. Use a walker or a cane if necessary and consider physical therapy for balance exercise. Get your eyesight checked regularly.  FINANCIAL OVERSIGHT: Supervision, especially oversight when making financial decisions or transactions is also recommended.  HOME SAFETY: Consider the safety of the kitchen when operating appliances like stoves, microwave oven, and blender. Consider having supervision and share cooking responsibilities until no longer able to participate in those. Accidents with firearms and other hazards in the house should be identified and addressed as well.   ABILITY TO BE LEFT ALONE: If patient is unable to contact 911 operator, consider using LifeLine, or when the need is there, arrange for someone to stay with patients. Smoking is a fire hazard, consider supervision or cessation. Risk of wandering should be assessed by caregiver and if detected at any point, supervision and safe proof recommendations should be instituted.  MEDICATION SUPERVISION: Inability to self-administer medication needs to be constantly addressed. Implement a mechanism to ensure safe administration of the medications.   DRIVING: Regarding driving, in patients with progressive memory problems, driving will be impaired. We advise to have someone else do the driving if trouble finding directions or if minor accidents are reported. Independent driving assessment is available to determine safety of driving.   If you are interested in the driving assessment, you can contact the following:  [facility] [employer] in [place] [phone]  [facility] [phone]  [facility] [phone] or [phone]    Mediterranean Diet A Mediterranean diet refers to food and lifestyle choices that are based on the traditions of countries located on the [employer]. This way of eating has been shown to help prevent certain conditions and improve outcomes for people who have chronic diseases, like kidney disease and heart disease. What are tips for following this plan? Lifestyle  Cook and eat meals together with your family, when possible. Drink enough fluid to keep your urine clear or pale yellow. Be physically active every day. This includes: Aerobic exercise like running or swimming. Leisure activities like gardening, walking, or housework. Get 7-8 hours of sleep each night. If recommended by your health care provider, drink red wine in moderation. This means 1 glass a day for nonpregnant women and 2 glasses a day for men. A glass of wine equals 5 oz (150 mL). Reading food labels  Check the serving size of packaged foods. For foods such as rice and pasta, the serving size refers to the amount of cooked product, not dry. Check the total fat in packaged foods. Avoid foods that have saturated fat or trans fats. Check the ingredients list for added sugars, such as corn syrup. Shopping  At the grocery store, buy most of your food from the areas near the walls of the store. This includes: Fresh fruits and vegetables (produce). Grains, beans, nuts, and seeds.  Some of these may be available in unpackaged forms or large amounts (in bulk). Fresh seafood. Poultry and eggs. Low-fat dairy products. Buy whole ingredients instead of prepackaged foods. Buy fresh fruits and vegetables in-season from local farmers markets. Buy frozen fruits and vegetables in resealable bags. If you do not have access to quality fresh seafood, buy precooked frozen shrimp or canned fish, such as tuna, salmon, or sardines. Buy small amounts of raw or cooked  vegetables, salads, or olives from the deli or salad bar at your store. Stock your pantry so you always have certain foods on hand, such as olive oil, canned tuna, canned tomatoes, rice, pasta, and beans. Cooking  Cook foods with extra-virgin olive oil instead of using butter or other vegetable oils. Have meat as a side dish, and have vegetables or grains as your main dish. This means having meat in small portions or adding small amounts of meat to foods like pasta or stew. Use beans or vegetables instead of meat in common dishes like chili or lasagna. Experiment with different cooking methods. Try roasting or broiling vegetables instead of steaming or sauteing them. Add frozen vegetables to soups, stews, pasta, or rice. Add nuts or seeds for added healthy fat at each meal. You can add these to yogurt, salads, or vegetable dishes. Marinate fish or vegetables using olive oil, lemon juice, garlic, and fresh herbs. Meal planning  Plan to eat 1 vegetarian meal one day each week. Try to work up to 2 vegetarian meals, if possible. Eat seafood 2 or more times a week. Have healthy snacks readily available, such as: Vegetable sticks with hummus. Greek yogurt. Fruit and nut trail mix. Eat balanced meals throughout the week. This includes: Fruit: 2-3 servings a day Vegetables: 4-5 servings a day Low-fat dairy: 2 servings a day Fish, poultry, or lean meat: 1 serving a day Beans and legumes: 2 or more servings a week Nuts and seeds: 1-2 servings a day Whole grains: 6-8 servings a day Extra-virgin olive oil: 3-4 servings a day Limit red meat and sweets to only a few servings a month What are my food choices? Mediterranean diet Recommended Grains: Whole-grain pasta. Brown rice. Bulgar wheat. Polenta. Couscous. Whole-wheat bread. Orpah Cobb. Vegetables: Artichokes. Beets. Broccoli. Cabbage. Carrots. Eggplant. Green beans. Chard. Kale. Spinach. Onions. Leeks. Peas. Squash. Tomatoes. Peppers.  Radishes. Fruits: Apples. Apricots. Avocado. Berries. Bananas. Cherries. Dates. Figs. Grapes. Lemons. Melon. Oranges. Peaches. Plums. Pomegranate. Meats and other protein foods: Beans. Almonds. Sunflower seeds. Pine nuts. Peanuts. Cod. Salmon. Scallops. Shrimp. Tuna. Tilapia. Clams. Oysters. Eggs. Dairy: Low-fat milk. Cheese. Greek yogurt. Beverages: Water. Red wine. Herbal tea. Fats and oils: Extra virgin olive oil. Avocado oil. Grape seed oil. Sweets and desserts: [place] yogurt with honey. Baked apples. Poached pears. Trail mix. Seasoning and other foods: Basil. Cilantro. Coriander. Cumin. Mint. Parsley. Sage. Rosemary. Tarragon. Garlic. Oregano. Thyme. Pepper. Balsalmic vinegar. Tahini. Hummus. Tomato sauce. Olives. Mushrooms. Limit these Grains: Prepackaged pasta or rice dishes. Prepackaged cereal with added sugar. Vegetables: Deep fried potatoes (french fries). Fruits: Fruit canned in syrup. Meats and other protein foods: Beef. Pork. Lamb. Poultry with skin. Hot dogs. Tomasa Blase. Dairy: Ice cream. Sour cream. Whole milk. Beverages: Juice. Sugar-sweetened soft drinks. Beer. Liquor and spirits. Fats and oils: Butter. Canola oil. Vegetable oil. Beef fat (tallow). Lard. Sweets and desserts: Cookies. Cakes. Pies. Candy. Seasoning and other foods: Mayonnaise. Premade sauces and marinades. The items listed may not be a complete list. Talk with your dietitian about what dietary choices are  right for you. Summary The Mediterranean diet includes both food and lifestyle choices. Eat a variety of fresh fruits and vegetables, beans, nuts, seeds, and whole grains. Limit the amount of red meat and sweets that you eat. Talk with your health care provider about whether it is safe for you to drink red wine in moderation. This means 1 glass a day for nonpregnant women and 2 glasses a day for men. A glass of wine equals 5 oz (150 mL). This information is not intended to replace advice given to you by your health  care provider. Make sure you discuss any questions you have with your health care provider. Document Released: 11/29/2015 Document Revised: 01/01/2016 Document Reviewed: 11/29/2015 Elsevier Interactive Patient Education  2017 [employer].

## 2022-09-17 NOTE — Progress Notes (Signed)
Assessment/Plan:    The patient is seen in neurologic consultation at the request of [name], MD for the evaluation of memory.  Dennis Wise is a very pleasant 72 y.o. year old RH male with a history of  DM2, hypertension, hyperlipidemia, MGUS, history of bradycardia ( per wife's report) , chronic thrombocytopenia, normocytic anemia, CKD stage III A, recent "acute ischemic right PCA stroke ". MoCA today is 20/30. Patient is able to participate on his IADLs. He is not on antidementia meds. Patient denies any concerning memory symptoms stating that this is due to age and decreased hearing.   Memory Impairment  Neurocognitive testing to further evaluate cognitive concerns and determine other underlying cause of memory changes, including potential contribution from sleep, anxiety, or depression  Recommend good control of cardiovascular risk factors.   Continue to control mood as per PCP Start memantine 5 mg nightly, may increase to bid if tolerated. Side effects discussed  Folllow up in 6 months     R PCA ischemic stroke.  .   In review, patient presented to the emergency department with clumsiness, unsteady gait, and 3 episodes of falls preceding the neurological events.  At the time, he denies any numbness or weakness in his limbs, or swallowing problems.  He presented to his PCP who ordered outpatient MRI, positive for acute right PCA stroke and sent to the ED immediately.  CT of the head at the ED showed hypodensity in the right PCA territory.  CT angio of the head and neck showed complete occlusion of the right PCA and focal atherosclerotic disease in the right greater than left MCA severe stenosis of the M2 branches, occlusion of the small right MCA branch and anterior temporal lobe stenosis, and left M2 and occluded or severely stenosed A1. neurology was consulted, starting him on dual antiplatelet therapy with Plavix and aspirin for 3 months as they felt that this was due to LV  disease, and then Plavix monotherapy.  He also continued atorvastatin 10 mg daily given his LDL is at goal of 59.  Echo showed LVEF between 65 and 70%, with no intracardiac source of embolism.  Continue Plavix and Baby ASA for 2 more weeks, then Plavix alone Recommend good control of cardiovascular risk factors.   Follow with Cardiology Continue secondary stroke prevention with statin and BP meds    Subjective:    The patient is accompanied by wife  who supplements the history.    How long did patient have memory difficulties? He denies, "but everyone thinks so". Patient has some difficulty remembering recent conversations and people names. He attributes it to decreased hearing and age repeats oneself? Denies  Disoriented when walking into a room?  Patient denies except occasionally not remembering what patient came to the room for   Leaving objects in unusual places? "Everyone has it" Keys in the wallet.   Wandering behavior? denies   Any personality changes ? denies   Any history of depression?: denies   Hallucinations or paranoia?  denies   Seizures? denies    Any sleep changes? " Does not sleep well, "thinks about different things and things he needs to do, he worries a lot"-wife says. Once in a while he has some  vivid dreams, REM behavior or sleepwalking. He has been talking in sleep for a long time   Sleep apnea? denies   Any hygiene concerns?  denies   Independent of bathing and dressing?  Endorsed  Does the  patient need help with medications?  Wife supervises and places them in a pillbox  Who is in charge of the finances?   Wife is in charge.      Any changes in appetite?   denies     Patient have trouble swallowing?  denies   Does the patient cook?  Any kitchen accidents such as leaving the stove on? Patient denies   Any headaches?  denies   Chronic back pain?  denies  Chronic L shoulder due to rotator cuff tear, followed with Ortho.  Ambulates with difficulty? denies    Recent falls or head injuries? 3/24 fell on [facility] [place] while working and head hit a pile of cables.   Vision changes? Depth perception is not as good as it used to be after stroke L<R.  R is better.  Unilateral weakness, numbness or tingling?  denies , no residual after the stroke. Patient  never had a similar episode .Denies any history of  TIA. Denies vertigo dizziness. Denies  history of headaches, dysarthria or dysphagia. No confusion or seizures. Denies any chest pain, or shortness of breath. Denies any fever or chills, or night sweats.  No hormonal supplements.  Denies any recent long distance trips or recent surgeries. No sick contacts. No new stressors present in personal life. Patient is compliant with his medications. Patient is very active, exercising daily. No family history of stroke .  Any tremors?  denies   Any anosmia?  denies   Any incontinence of urine? Gets up frequently at night because he needs water and then he goes again.   Any bowel dysfunction? denies      Patient lives with wife. History of heavy alcohol intake? denies   History of heavy tobacco use? denies   Family history of dementia? Father had dementia ?type Does patient drive? No, stopped since the stroke due to issues with depth perception.    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   Allergies  Allergen Reactions   Promethazine Hcl     REACTION: AGITIATION    Current Outpatient Medications  Medication Instructions   ACCU-CHEK FASTCLIX LANCETS MISC Use to test blood sugar once daily or as needed.  Diagnosis:  E11.3299  Non insulin dependent.    acetaminophen (TYLENOL) 650 mg, Oral, Every 4 hours PRN   aspirin EC 81 mg, Oral, Daily, Swallow whole. STOP after 90 Days and then continue Plavix Monotherapy   atorvastatin (LIPITOR) 10 mg, Oral, Daily at bedtime   blood glucose meter kit and supplies KIT Dispense based on patient and insurance preference. Use up to four times daily  as directed. (FOR ICD-9 250.00, 250.01).   Blood Glucose Monitoring Suppl (ACCU-CHEK NANO SMARTVIEW) w/Device KIT Use to check blood sugar once daily or as needed.  Diagnosis: E11.3299  Non insulin dependent.   clopidogrel (PLAVIX) 75 mg, Oral, Daily   glimepiride (AMARYL) 2 mg, Oral, Daily   losartan (COZAAR) 100 mg, Oral, Daily   metFORMIN (GLUCOPHAGE) 850 mg, Oral, 2 times daily   Multiple Vitamin (MULTIVITAMIN) tablet 1 tablet, Oral, Daily, Centrum Silver   niacin (VITAMIN B3) 1,500 mg, Oral, Daily   pioglitazone (ACTOS) 45 mg, Oral, Daily   senna-docusate (SENOKOT-S) 8.6-50 MG tablet 1 tablet, Oral, At bedtime PRN     VITALS:   Vitals:   09/17/22 1306  BP: 124/78  Pulse: 78  Resp: 18  SpO2: 98%  Weight: 246 lb (111.6 kg)  Height: 6\' 1"  (1.854 m)  PHYSICAL EXAM   HEENT:  Normocephalic, atraumatic. The mucous membranes are moist. The superficial temporal arteries are without ropiness or tenderness. Cardiovascular: Regular rate and rhythm. Lungs: Clear to auscultation bilaterally. Neck: There are no carotid bruits noted bilaterally.  NEUROLOGICAL:    09/17/2022    1:00 PM  Montreal Cognitive Assessment   Visuospatial/ Executive (0/5) 1  Naming (0/3) 2  Attention: Read list of digits (0/2) 2  Attention: Read list of letters (0/1) 1  Attention: Serial 7 subtraction starting at 100 (0/3) 2  Language: Repeat phrase (0/2) 1  Language : Fluency (0/1) 1  Abstraction (0/2) 2  Delayed Recall (0/5) 2  Orientation (0/6) 5  Total 19  Adjusted Score (based on education) 20       02/21/2019    2:03 PM 01/30/2017    9:40 AM 10/22/2015    8:21 AM  MMSE - Mini Mental State Exam  Orientation to time 5 5 5   Orientation to Place 5 5 5   Registration 3 3 3   Attention/ Calculation 5 0 0  Recall 3 3 3   Language- name 2 objects  0 0  Language- repeat 1 1 1   Language- follow 3 step command  3 3  Language- read & follow direction  0 0  Write a sentence  0 0  Copy design  0 0   Total score  20 20     Orientation:  Alert and oriented to person, place and time. No aphasia or dysarthria. Fund of knowledge is appropriate. Recent memory impaired and remote memory intact.  Attention and concentration are normal.  Able to name objects and repeat phrases. Delayed recall  /  Cranial nerves: There is good facial symmetry. Extraocular muscles are intact and visual fields are full to confrontational testing. Speech is fluent and clear. no tongue deviation. Hearing is intact to conversational tone.  Delayed recall 2/5 Tone: Tone is good throughout. Sensation: Sensation is intact to light touch and pinprick throughout. Vibration is intact at the bilateral big toe.There is no extinction with double simultaneous stimulation.   Coordination: The patient has no difficulty with RAM's or FNF bilaterally. Normal finger to nose  Motor: Strength is 5/5 in the bilateral upper and lower extremities. There is no pronator drift. There are no fasciculations noted. DTR's: Deep tendon reflexes are 2/4 at the bilateral biceps, triceps, brachioradialis, patella and achilles.  Plantar responses are downgoing bilaterally. Gait and Station: The patient is able to ambulate with some difficulty.The patient is able to ambulate in a tandem fashion, unable to stand in the Romberg position. Stride is short, slightly broad based gait.     Thank you for allowing [place] the opportunity to participate in the care of this nice patient. Please do not hesitate to contact us for any questions or concerns.   Total time spent on today's visit was 53 minutes dedicated to this patient today, preparing to see patient, examining the patient, ordering tests and/or medications and counseling the patient, documenting clinical information in the EHR or other health record, independently interpreting results and communicating results to the patient/family, discussing treatment and goals, answering patient's questions and coordinating  care.  Cc:  [name], MD  Marlowe Kays 09/17/2022 1:35 PM

## 2022-09-17 NOTE — Therapy (Signed)
OUTPATIENT PHYSICAL THERAPY NEURO EVALUATION   Patient Name: Dennis Wise MRN: [id number] DOB:1950-09-18, 72 y.o., male Today's Date: 09/18/2022   PCP: Dennis Nam, MD  REFERRING PROVIDER: [name], MD   END OF SESSION:  PT End of Session - 09/18/22 1610     Visit Number 1    Number of Visits 24    Date for PT Re-Evaluation 12/11/22    Progress Note Due on Visit 10    PT Start Time 1605    PT Stop Time 1658    PT Time Calculation (min) 53 min    Equipment Utilized During Treatment Gait belt    Activity Tolerance Patient tolerated treatment well    Behavior During Therapy Flat affect;WFL for tasks assessed/performed             Past Medical History:  Diagnosis Date   Anemia    Iron deficiency part of this   Cataract    bilateral lens implants   Diabetes mellitus    type II   [name] (HCC)    GERD (gastroesophageal reflux disease) 01/1989   Helicobacter pylori gastritis 06/11/2011   On EGD 05/2011    Hyperlipidemia    Hypertension 11/1999   Internal hemorrhoids    Iron deficiency anemia, unspecified 03/04/2011   MGUS (monoclonal gammopathy of unknown significance)    possible dx initially, had negative f/u   Pancytopenia, acquired (HCC)    Personal history of colonic adenomas 06/05/2012   Pneumonia    Past Surgical History:  Procedure Laterality Date   CATARACT EXTRACTION Bilateral    COLONOSCOPY     ESOPHAGOGASTRODUODENOSCOPY     2012 H. pylori gastritis   FLEXIBLE SIGMOIDOSCOPY  05/1988   internal hemorrhoids   KNEE SURGERY  12/2002   lt knee fx repair ORIF   TIBIA FRACTURE SURGERY     left 2012   Patient Active Problem List   Diagnosis Date Noted   Acute ischemic right PCA stroke (HCC) 09/05/2022   Stroke (HCC) 09/05/2022   Memory change 09/03/2022   Shoulder pain 07/02/2022   [name] (HCC) 03/27/2022   Monoclonal B-cell lymphocytosis 01/15/2022   Weight loss 01/15/2022   Syncope 11/22/2021   Nocturia 08/18/2021    IDA (iron deficiency anemia) 07/24/2021   Thrombocytopenia (HCC) 05/03/2021   Normocytic anemia 04/03/2021   Unstable ankle 09/12/2020   Back pain 09/12/2020   Dupuytren contracture 03/03/2019   Healthcare maintenance 02/03/2017   Pseudophakia, both eyes 07/17/2016   Combined form of senile cataract of both eyes 05/02/2016   Hearing loss 10/20/2014   Obesity (BMI 30-39.9) 10/16/2013   Advance care planning 10/16/2013   Actinic keratosis 10/16/2013   Personal history of colonic adenomas 06/05/2012   Pancytopenia, acquired (HCC) 05/01/2011   Medicare annual wellness visit, subsequent 10/24/2010   FOOT PAIN, RIGHT 01/08/2010   Diabetic retinopathy (HCC) 03/21/2005   Essential hypertension 11/20/1999   GERD 01/19/1989   DM type 2 with diabetic background retinopathy (HCC) 04/21/1988   HYPERCHOLESTEROLEMIA  ([id number]) 1990 04/21/1988    ONSET DATE: 07/10/22  REFERRING DIAG: I69.62 (ICD-10-CM) - History of CVA in adulthood   THERAPY DIAG:  Abnormality of gait and mobility  Difficulty in walking, not elsewhere classified  Muscle weakness (generalized)  Other abnormalities of gait and mobility  Unsteadiness on feet  Rationale for Evaluation and Treatment: Rehabilitation  SUBJECTIVE:  SUBJECTIVE STATEMENT: Pt suffered  RC tear on the left side on 07/13/22 and he is being treated for this via workman's compensation. Has been confirmed with MRI. Pt had stroke on 09/05/22 and went to [facility] ER and was in hospital for 2 days. Pt ambulates with SPC on the R currently. 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 [employer] has him hired for 20 days August 21-Sept 9 and he wants to give them certainty  that he will be able to do this or not. Pt reports his job is more tedious than strenuous. Pt wife reports difficulty with walking in diagonal patterns.    Pt accompanied by: significant other wife : Dennis Wise   PERTINENT HISTORY:   Per ED visit 09/05/22:   Dennis Wise is a 72 y.o. male with medical history significant of IIDM, HTN, HLD, MGUS, chronic thrombocytopenia, sent from PCPs office for stroke workup.  Patient symptoms started March 21st after he sustained a mechanical fall at his stop when he hit his left shoulder and left rib cage.  No fracture or dislocation.  Since then the patient has had episodes of clumsiness and unsteady gait and had another 3 episodes of fall at home at different times.  Family also reported that the patient appeared to have memory issue after the initial event in March, as patient started to decreased short memory and complained about worsening of his visions.  Denies any numbness weakness of any of the limbs, no choking or cough or swallowing problems.  Today patient went to see PCP who ordered a outpatient MRI which turned positive for acute right PCA stroke and PCP asked patient to come to ED right away.  Family also reported poorly controlled blood pressure and glucose at home recently.  Pt stroke effected his vision and he has difficulty with peripheral vision and depth perception on the left.     PAIN:  Are you having pain? Yes: NPRS scale: 0/10 Pain location: Left shoulder - being treated  Aggravating factors: rolling over in bed, over use of shoulder  Relieving factors: rest  PRECAUTIONS: Fall  WEIGHT BEARING RESTRICTIONS: No  FALLS: Has patient fallen in last 6 months? Yes. 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   LIVING ENVIRONMENT: Lives with: lives with their family and lives with their spouse Lives in:  House/apartment Stairs: Yes: Internal: steps to basement but does not need to use  steps; on right going up and External: 2 steps; on left going up Has following equipment at home: Single point cane, Quad cane large base, Walker - 2 wheeled, shower chair, and Grab bars  PLOF: Independent  PATIENT GOALS: go back to work, see pertinent history and subjective   OBJECTIVE: (objective measures completed at initial evaluation unless otherwise dated)  DIAGNOSTIC FINDINGS:   IMPRESSION: 1. Acute right PCA distribution infarct. 2. Brain atrophy asymmetrically affecting the anterior right temporal lobe and correlating with the history of memory loss.  COGNITION: Overall cognitive status: Within functional limits for tasks assessed     COORDINATION: Not assessed, assess UE coordination at future session    POSTURE: rounded shoulders  LOWER EXTREMITY ROM:      (Blank rows = not tested)  LOWER EXTREMITY MMT:  MMT  Right Eval Left Eval  Hip flexion 4+ 4  Hip extension    Hip abduction 4+ 4+  Hip adduction 5 5  Hip internal rotation    Hip external rotation    Knee flexion 4+ 4  Knee extension 5 4+  Ankle dorsiflexion 4+ 4+  Ankle plantarflexion    Ankle inversion    Ankle eversion    (Blank rows = not tested)  BED MOBILITY:  WNL per report   TRANSFERS: Assistive device utilized: Single point cane  Sit to stand: Modified independence Stand to sit: Modified independence Chair to chair: Modified independence Floor:  not assessed   RAMP:  Not assessed   CURB:  Not assessed   STAIRS: Level of Assistance: CGA Stair Negotiation Technique: Step to Pattern with Bilateral Rails Number of Stairs: 4  Height of Stairs: 6 in  Comments: on first step pt foot not properly on step, no LOB  GAIT: Gait pattern: decreased step length- Right, decreased stance time- Right, decreased stride length, and decreased hip/knee flexion- Left Distance walked: 30 ft Assistive device  utilized: Single point cane Level of assistance: Modified independence Comments: ambulates with SPC normally but completes DGI with cane.   FUNCTIONAL TESTS:  5 times sit to stand: 14.98 sec 6 minute walk test: test visit 2 10 meter walk test: test visit 2  Dynamic Gait Index: 11   [facility] PT Assessment - 09/18/22 0001       Standardized Balance Assessment   Standardized Balance Assessment Dynamic Gait Index      Dynamic Gait Index   Level Surface Mild Impairment (P)     Change in Gait Speed Mild Impairment (P)     Gait with Horizontal Head Turns Moderate Impairment (P)     Gait with Vertical Head Turns Mild Impairment (P)     Gait and Pivot Turn Mild Impairment (P)     Step Over Obstacle Severe Impairment (P)     Step Around Obstacles Moderate Impairment (P)     Steps Moderate Impairment (P)     Total Score 11 (P)              PATIENT SURVEYS:  FOTO 62 risk adjusted goal of 77  TODAY'S TREATMENT:                                                                                                                              DATE:   Eval Only     PATIENT EDUCATION: Education details: POC Person educated: Patient Education method: Explanation Education comprehension: verbalized understanding  HOME EXERCISE PROGRAM: Establish visit 2   GOALS: Goals reviewed with patient? Yes  SHORT TERM GOALS: Target date: 10/16/2022        Patient will be independent in home exercise program to improve strength/mobility for better functional independence with ADLs. Baseline: No HEP currently  Goal status: INITIAL     LONG TERM GOALS: Target date: 12/11/2022  1.  Patient will increase FOTO score to equal to or greater than  73   to demonstrate statistically significant improvement in mobility and quality of life.  Baseline: 62 Goal status: INITIAL   2.  Patient will increase Dynamic gait index Balance score by > 8 points to demonstrate decreased fall risk during  functional activities. Baseline: 11 Goal status: INITIAL    3.   Patient will increase 10 meter walk test to >1.18m/s as to improve gait speed for better community ambulation and to reduce fall risk. Baseline: test visit 2  Goal status: INITIAL  4.   Patient will increase six minute walk test distance to >1000 for progression to community ambulator and improve gait ability Baseline: test visit 2  Goal status: INITIAL     ASSESSMENT:  CLINICAL IMPRESSION: Patient is a 72 y.o. M who was seen today for physical therapy evaluation and treatment for mobility impairments post R post cerebral artery stroke. Pt presents with significant balance deficits posing increased risk of falls AEB dynamic gait index score. Pt will be tested for endurance and strength for community mobility purposes next session. Pt will benefit from skilled PT interventions to improve his gait, improve his balance, and to allow him to return to his PLOF.   OBJECTIVE IMPAIRMENTS: Abnormal gait, decreased activity tolerance, decreased balance, decreased endurance, decreased mobility, difficulty walking, and decreased strength.   ACTIVITY LIMITATIONS: carrying, lifting, standing, stairs, and locomotion level  PARTICIPATION LIMITATIONS: driving, shopping, community activity, occupation, and yard work  PERSONAL FACTORS: Age, Fitness, and 3+ comorbidities: HTN, DM, farmer's lung, HLD  are also affecting patient's functional outcome.   REHAB POTENTIAL: Good  CLINICAL DECISION MAKING: Evolving/moderate complexity  EVALUATION COMPLEXITY: Moderate  PLAN:  PT FREQUENCY: 2x/week  PT DURATION: 12 weeks  PLANNED INTERVENTIONS: Therapeutic exercises, Therapeutic activity, Neuromuscular re-education, Balance training, Gait training, Patient/Family education, Self Care, Joint mobilization, Stair training, and Manual therapy  PLAN FOR NEXT SESSION: ensure pt has cognitive ability to plug thins in in high stress environment,  6 minute walk, 10 meter walk,    Norman Herrlich, PT 09/18/2022, 5:03 PM

## 2022-09-18 ENCOUNTER — Encounter: Payer: Self-pay | Admitting: Physical Therapy

## 2022-09-18 ENCOUNTER — Ambulatory Visit: Payer: Medicare HMO | Attending: Family Medicine | Admitting: Physical Therapy

## 2022-09-18 DIAGNOSIS — Z8673 Personal history of transient ischemic attack (TIA), and cerebral infarction without residual deficits: Secondary | ICD-10-CM | POA: Insufficient documentation

## 2022-09-18 DIAGNOSIS — R262 Difficulty in walking, not elsewhere classified: Secondary | ICD-10-CM

## 2022-09-18 DIAGNOSIS — R2689 Other abnormalities of gait and mobility: Secondary | ICD-10-CM

## 2022-09-18 DIAGNOSIS — M6281 Muscle weakness (generalized): Secondary | ICD-10-CM

## 2022-09-18 DIAGNOSIS — R2681 Unsteadiness on feet: Secondary | ICD-10-CM

## 2022-09-18 DIAGNOSIS — R269 Unspecified abnormalities of gait and mobility: Secondary | ICD-10-CM | POA: Diagnosis not present

## 2022-09-19 ENCOUNTER — Encounter: Payer: Self-pay | Admitting: Physical Therapy

## 2022-09-19 ENCOUNTER — Ambulatory Visit: Payer: Medicare HMO | Admitting: Physical Therapy

## 2022-09-19 DIAGNOSIS — M6281 Muscle weakness (generalized): Secondary | ICD-10-CM | POA: Diagnosis not present

## 2022-09-19 DIAGNOSIS — R2681 Unsteadiness on feet: Secondary | ICD-10-CM

## 2022-09-19 DIAGNOSIS — Z8673 Personal history of transient ischemic attack (TIA), and cerebral infarction without residual deficits: Secondary | ICD-10-CM | POA: Diagnosis not present

## 2022-09-19 DIAGNOSIS — R262 Difficulty in walking, not elsewhere classified: Secondary | ICD-10-CM

## 2022-09-19 DIAGNOSIS — R269 Unspecified abnormalities of gait and mobility: Secondary | ICD-10-CM

## 2022-09-19 DIAGNOSIS — R2689 Other abnormalities of gait and mobility: Secondary | ICD-10-CM

## 2022-09-19 NOTE — Progress Notes (Signed)
Agree.  Thanks.    Physician Documentation Your signature is required to indicate approval of the treatment plan as stated above. By signing this report, you are approving the plan of care. Please sign and either send electronically or print and fax the signed copy to the number below. If you approve with modifications, please indicate those in the space provided.__ Physician Signature: [name] Date:___05/31/24  Time: 11:05 AM   [name], MD

## 2022-09-19 NOTE — Therapy (Signed)
OUTPATIENT PHYSICAL THERAPY NEURO TREATMENT   Patient Name: Dennis Wise MRN: [id number] DOB:28-Feb-1951, 72 y.o., male Today's Date: 09/19/2022   PCP: Dennis Nam, MD  REFERRING PROVIDER: [name], MD   END OF SESSION:  PT End of Session - 09/19/22 0944     Visit Number 2    Number of Visits 24    Date for PT Re-Evaluation 12/11/22    Progress Note Due on Visit 10    PT Start Time 0932    PT Stop Time 1013    PT Time Calculation (min) 41 min    Equipment Utilized During Treatment Gait belt    Activity Tolerance Patient tolerated treatment well    Behavior During Therapy Flat affect;WFL for tasks assessed/performed             Past Medical History:  Diagnosis Date   Anemia    Iron deficiency part of this   Cataract    bilateral lens implants   Diabetes mellitus    type II   [name] (HCC)    GERD (gastroesophageal reflux disease) 01/1989   Helicobacter pylori gastritis 06/11/2011   On EGD 05/2011    Hyperlipidemia    Hypertension 11/1999   Internal hemorrhoids    Iron deficiency anemia, unspecified 03/04/2011   MGUS (monoclonal gammopathy of unknown significance)    possible dx initially, had negative f/u   Pancytopenia, acquired (HCC)    Personal history of colonic adenomas 06/05/2012   Pneumonia    Past Surgical History:  Procedure Laterality Date   CATARACT EXTRACTION Bilateral    COLONOSCOPY     ESOPHAGOGASTRODUODENOSCOPY     2012 H. pylori gastritis   FLEXIBLE SIGMOIDOSCOPY  05/1988   internal hemorrhoids   KNEE SURGERY  12/2002   lt knee fx repair ORIF   TIBIA FRACTURE SURGERY     left 2012   Patient Active Problem List   Diagnosis Date Noted   Acute ischemic right PCA stroke (HCC) 09/05/2022   Stroke (HCC) 09/05/2022   Memory change 09/03/2022   Shoulder pain 07/02/2022   [name] (HCC) 03/27/2022   Monoclonal B-cell lymphocytosis 01/15/2022   Weight loss 01/15/2022   Syncope 11/22/2021   Nocturia 08/18/2021    IDA (iron deficiency anemia) 07/24/2021   Thrombocytopenia (HCC) 05/03/2021   Normocytic anemia 04/03/2021   Unstable ankle 09/12/2020   Back pain 09/12/2020   Dupuytren contracture 03/03/2019   Healthcare maintenance 02/03/2017   Pseudophakia, both eyes 07/17/2016   Combined form of senile cataract of both eyes 05/02/2016   Hearing loss 10/20/2014   Obesity (BMI 30-39.9) 10/16/2013   Advance care planning 10/16/2013   Actinic keratosis 10/16/2013   Personal history of colonic adenomas 06/05/2012   Pancytopenia, acquired (HCC) 05/01/2011   Medicare annual wellness visit, subsequent 10/24/2010   FOOT PAIN, RIGHT 01/08/2010   Diabetic retinopathy (HCC) 03/21/2005   Essential hypertension 11/20/1999   GERD 01/19/1989   DM type 2 with diabetic background retinopathy (HCC) 04/21/1988   HYPERCHOLESTEROLEMIA  ([id number]) 1990 04/21/1988    ONSET DATE: 07/10/22  REFERRING DIAG: W09.81 (ICD-10-CM) - History of CVA in adulthood   THERAPY DIAG:  Abnormality of gait and mobility  Difficulty in walking, not elsewhere classified  Muscle weakness (generalized)  Other abnormalities of gait and mobility  Unsteadiness on feet  Rationale for Evaluation and Treatment: Rehabilitation  SUBJECTIVE:  SUBJECTIVE STATEMENT:  Patient reports no significant changes since yesterday at his initial evaluation.  Patient does continue to ask if he is ready for work and physical therapist but some know if he was to return to work today physical therapist does not feel like he will be ready but whether he will be ready in a few weeks will be up for determination based on how he progresses with interventions performed during sessions.  Pt accompanied by: significant other wife : Dennis Wise   PERTINENT HISTORY:   Per ED  visit 09/05/22:   Dennis Wise is a 72 y.o. male with medical history significant of IIDM, HTN, HLD, MGUS, chronic thrombocytopenia, sent from PCPs office for stroke workup.  Patient symptoms started March 21st after he sustained a mechanical fall at his stop when he hit his left shoulder and left rib cage.  No fracture or dislocation.  Since then the patient has had episodes of clumsiness and unsteady gait and had another 3 episodes of fall at home at different times.  Family also reported that the patient appeared to have memory issue after the initial event in March, as patient started to decreased short memory and complained about worsening of his visions.  Denies any numbness weakness of any of the limbs, no choking or cough or swallowing problems.  Today patient went to see PCP who ordered a outpatient MRI which turned positive for acute right PCA stroke and PCP asked patient to come to ED right away.  Family also reported poorly controlled blood pressure and glucose at home recently.  Pt stroke effected his vision and he has difficulty with peripheral vision and depth perception on the left.  From PT eval:  Pt suffered  RC tear on the left side on 07/13/22 and he is being treated for this via workman's compensation. Has been confirmed with MRI. Pt had stroke on 09/05/22 and went to [facility] ER and was in hospital for 2 days. Pt ambulates with SPC on the R currently. 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 Pt reports his job is more tedious than strenuous. Pt wife reports difficulty with walking in diagonal patterns.   PAIN:  Are you having pain? Yes: NPRS scale: 0/10 Pain location: Left shoulder - being treated  Aggravating factors: rolling over in bed, over use of  shoulder  Relieving factors: rest  PRECAUTIONS: Fall  WEIGHT BEARING RESTRICTIONS: No  FALLS: Has patient fallen in last 6 months? Yes. 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   LIVING ENVIRONMENT: Lives with: lives with their family and lives with their spouse Lives in: House/apartment Stairs: Yes: Internal: steps to basement but does not need to use  steps; on right going up and External: 2 steps; on left going up Has following equipment at home: Single point cane, Quad cane large base, Walker - 2 wheeled, shower chair, and Grab bars  PLOF: Independent  PATIENT GOALS: go back to work, see pertinent history and subjective   OBJECTIVE: (objective measures completed at initial evaluation unless otherwise dated)  DIAGNOSTIC FINDINGS:   IMPRESSION:  1. Acute right PCA distribution infarct. 2. Brain atrophy asymmetrically affecting the anterior right temporal lobe and correlating with the history of memory loss.  COGNITION: Overall cognitive status: Within functional limits for tasks assessed     COORDINATION: Not assessed, assess UE coordination at future session    POSTURE: rounded shoulders  LOWER EXTREMITY ROM:      (Blank rows = not tested)  LOWER EXTREMITY MMT:    MMT  Right Eval Left Eval  Hip flexion 4+ 4  Hip extension    Hip abduction 4+ 4+  Hip adduction 5 5  Hip internal rotation    Hip external rotation    Knee flexion 4+ 4  Knee extension 5 4+  Ankle dorsiflexion 4+ 4+  Ankle plantarflexion    Ankle inversion    Ankle eversion    (Blank rows = not tested)  BED MOBILITY:  WNL per report   TRANSFERS: Assistive device utilized: Single point cane  Sit to stand: Modified independence Stand to sit: Modified independence Chair to chair: Modified independence Floor:  not assessed   RAMP:  Not assessed    CURB:  Not assessed   STAIRS: Level of Assistance: CGA Stair Negotiation Technique: Step to Pattern with Bilateral Rails Number of Stairs: 4  Height of Stairs: 6 in  Comments: on first step pt foot not properly on step, no LOB  GAIT: Gait pattern: decreased step length- Right, decreased stance time- Right, decreased stride length, and decreased hip/knee flexion- Left Distance walked: 30 ft Assistive device utilized: Single point cane Level of assistance: Modified independence Comments: ambulates with SPC normally but completes DGI with cane.   FUNCTIONAL TESTS:  5 times sit to stand: 14.98 sec 6 minute walk test: test visit 2 10 meter walk test: test visit 2  Dynamic Gait Index: 11     PATIENT SURVEYS:  FOTO 62 risk adjusted goal of 77  TODAY'S TREATMENT:                                                                                                                              Exercise/Activity Sets/Reps/Time/ Resistance Assistance Charge type Comments   865 ft 11.85 sec    865 ft, intermittent use of SPC   Step taps on 6 inch step  *10  *10 ea with 4# AW  UE, CGA     Stepping over 1/2 foam rollers x 2-3  X multiple reps  SPC, CGA   Initial difficulty with having to stop and gauge distance but improved with practice.         Instructed patient in the following home exercises and completed the below repetitions Exercises - Standing March with Counter Support  - 1 x daily - 7 x weekly - 2 sets - 25 reps - Side Stepping with Counter Support  - 1 x daily - 7 x weekly - 2 sets - 12 reps - standing in split stance with vertical  head nods   - 1 x daily - 7 x weekly - 2 sets - 15 reps-significant difficulty with coordinating lower extremity movement for proper foot step width.  Difficult to assess if patient unable to obtain this position due to issues with visual processing or issues with auditory processing and direction following.  Will continue to monitor and  assess in future sessions - Walking with Head Rotation  - 1 x daily - 7 x weekly  Rationale for Evaluation and Treatment Rehabilitation  Pt educated throughout session about proper posture and technique with exercises. Improved exercise technique, movement at target joints, use of target muscles after min to mod verbal, visual, tactile cues. Note: Portions of this document were prepared using Dragon voice recognition software and although reviewed may contain unintentional dictation errors in syntax, grammar, or spelling.      PATIENT EDUCATION: Education details: POC Person educated: Patient Education method: Explanation Education comprehension: verbalized understanding  HOME EXERCISE PROGRAM: Access Code: [email or web address] URL: [email or web address] Date: 09/19/2022 Prepared by: Thresa Ross  Exercises - Standing March with Counter Support  - 1 x daily - 7 x weekly - 2 sets - 25 reps - Side Stepping with Counter Support  - 1 x daily - 7 x weekly - 2 sets - 12 reps - standing in split stance with vertical head nods   - 1 x daily - 7 x weekly - 2 sets - 15 reps - Walking with Head Rotation  - 1 x daily - 7 x weekly  GOALS: Goals reviewed with patient? Yes  SHORT TERM GOALS: Target date: 10/16/2022        Patient will be independent in home exercise program to improve strength/mobility for better functional independence with ADLs. Baseline: No HEP currently  Goal status: INITIAL     LONG TERM GOALS: Target date: 12/11/2022   1.  Patient will increase FOTO score to equal to or greater than  73   to demonstrate statistically significant improvement in mobility and quality of life.  Baseline: 62 Goal status: INITIAL   2.  Patient will increase Dynamic gait index Balance score by > 8 points to demonstrate decreased fall risk during functional activities. Baseline: 11 Goal status: INITIAL    3.   Patient will increase 10 meter walk test to >1.61m/s as to  improve gait speed for better community ambulation and to reduce fall risk. Baseline: test visit 2  Goal status: INITIAL  4.   Patient will increase six minute walk test distance to >1000 for progression to community ambulator and improve gait ability Baseline: test visit 2  Goal status: INITIAL     ASSESSMENT:  CLINICAL IMPRESSION: Patient presents to physical therapy for initial treatment session.  Treatment session focused on improving lower extremity strength as well as improving patient's lower extremity proprioception and his awareness to his environment.  Patient continues to demonstrate increased difficulty with higher-level balance tasks but did show some improvement with ambulation with head turns this date compared to yesterday.  Patient provided with specific home exercise program based on his current needs and will continue to progress this in future sessions.  Patient continues to inquire regarding if he is ready to return to work and based on physical therapist evaluation as well as the session physical therapist feels he is not currently ready to return to work that we will reevaluate this on a week to week basis based on how patient progresses.Pt will continue to benefit from skilled physical therapy intervention  to address impairments, improve QOL, and attain therapy goals.    OBJECTIVE IMPAIRMENTS: Abnormal gait, decreased activity tolerance, decreased balance, decreased endurance, decreased mobility, difficulty walking, and decreased strength.   ACTIVITY LIMITATIONS: carrying, lifting, standing, stairs, and locomotion level  PARTICIPATION LIMITATIONS: driving, shopping, community activity, occupation, and yard work  PERSONAL FACTORS: Age, Fitness, and 3+ comorbidities: HTN, DM, farmer's lung, HLD  are also affecting patient's functional outcome.   REHAB POTENTIAL: Good  CLINICAL DECISION MAKING: Evolving/moderate complexity  EVALUATION COMPLEXITY:  Moderate  PLAN:  PT FREQUENCY: 2x/week  PT DURATION: 12 weeks  PLANNED INTERVENTIONS: Therapeutic exercises, Therapeutic activity, Neuromuscular re-education, Balance training, Gait training, Patient/Family education, Self Care, Joint mobilization, Stair training, and Manual therapy  PLAN FOR NEXT SESSION: ensure pt has cognitive ability to plug thins in in high stress environment, 6 minute walk, 10 meter walk,    Norman Herrlich, PT 09/19/2022, 12:26 PM

## 2022-09-22 ENCOUNTER — Encounter: Payer: Self-pay | Admitting: Physical Therapy

## 2022-09-22 ENCOUNTER — Ambulatory Visit: Payer: Medicare HMO | Attending: Family Medicine | Admitting: Physical Therapy

## 2022-09-22 DIAGNOSIS — R2689 Other abnormalities of gait and mobility: Secondary | ICD-10-CM | POA: Diagnosis not present

## 2022-09-22 DIAGNOSIS — R269 Unspecified abnormalities of gait and mobility: Secondary | ICD-10-CM | POA: Insufficient documentation

## 2022-09-22 DIAGNOSIS — R2681 Unsteadiness on feet: Secondary | ICD-10-CM | POA: Diagnosis not present

## 2022-09-22 DIAGNOSIS — M6281 Muscle weakness (generalized): Secondary | ICD-10-CM | POA: Insufficient documentation

## 2022-09-22 DIAGNOSIS — R262 Difficulty in walking, not elsewhere classified: Secondary | ICD-10-CM | POA: Insufficient documentation

## 2022-09-22 NOTE — Therapy (Signed)
OUTPATIENT PHYSICAL THERAPY NEURO TREATMENT   Patient Name: Dennis Wise MRN: [id number] DOB:24-Aug-1950, 72 y.o., male Today's Date: 09/22/2022   PCP: [name], MD  REFERRING PROVIDER: [name], MD   END OF SESSION:  PT End of Session - 09/22/22 1350     Visit Number 3    Number of Visits 24    Date for PT Re-Evaluation 12/11/22    Progress Note Due on Visit 10    PT Start Time 1347    PT Stop Time 1428    PT Time Calculation (min) 41 min    Equipment Utilized During Treatment Gait belt    Activity Tolerance Patient tolerated treatment well    Behavior During Therapy Flat affect;WFL for tasks assessed/performed             Past Medical History:  Diagnosis Date   Anemia    Iron deficiency part of this   Cataract    bilateral lens implants   Diabetes mellitus    type II   [name] (HCC)    GERD (gastroesophageal reflux disease) 01/1989   Helicobacter pylori gastritis 06/11/2011   On EGD 05/2011    Hyperlipidemia    Hypertension 11/1999   Internal hemorrhoids    Iron deficiency anemia, unspecified 03/04/2011   MGUS (monoclonal gammopathy of unknown significance)    possible dx initially, had negative f/u   Pancytopenia, acquired (HCC)    Personal history of colonic adenomas 06/05/2012   Pneumonia    Past Surgical History:  Procedure Laterality Date   CATARACT EXTRACTION Bilateral    COLONOSCOPY     ESOPHAGOGASTRODUODENOSCOPY     2012 H. pylori gastritis   FLEXIBLE SIGMOIDOSCOPY  05/1988   internal hemorrhoids   KNEE SURGERY  12/2002   lt knee fx repair ORIF   TIBIA FRACTURE SURGERY     left 2012   Patient Active Problem List   Diagnosis Date Noted   Acute ischemic right PCA stroke (HCC) 09/05/2022   Stroke (HCC) 09/05/2022   Memory change 09/03/2022   Shoulder pain 07/02/2022   [name] (HCC) 03/27/2022   Monoclonal B-cell lymphocytosis 01/15/2022   Weight loss 01/15/2022   Syncope 11/22/2021   Nocturia 08/18/2021    IDA (iron deficiency anemia) 07/24/2021   Thrombocytopenia (HCC) 05/03/2021   Normocytic anemia 04/03/2021   Unstable ankle 09/12/2020   Back pain 09/12/2020   Dupuytren contracture 03/03/2019   Healthcare maintenance 02/03/2017   Pseudophakia, both eyes 07/17/2016   Combined form of senile cataract of both eyes 05/02/2016   Hearing loss 10/20/2014   Obesity (BMI 30-39.9) 10/16/2013   Advance care planning 10/16/2013   Actinic keratosis 10/16/2013   Personal history of colonic adenomas 06/05/2012   Pancytopenia, acquired (HCC) 05/01/2011   Medicare annual wellness visit, subsequent 10/24/2010   FOOT PAIN, RIGHT 01/08/2010   Diabetic retinopathy (HCC) 03/21/2005   Essential hypertension 11/20/1999   GERD 01/19/1989   DM type 2 with diabetic background retinopathy (HCC) 04/21/1988   HYPERCHOLESTEROLEMIA  ([id number]) 1990 04/21/1988    ONSET DATE: 07/10/22  REFERRING DIAG: W09.81 (ICD-10-CM) - History of CVA in adulthood   THERAPY DIAG:  Abnormality of gait and mobility  Difficulty in walking, not elsewhere classified  Muscle weakness (generalized)  Other abnormalities of gait and mobility  Unsteadiness on feet  Rationale for Evaluation and Treatment: Rehabilitation  SUBJECTIVE:  SUBJECTIVE STATEMENT:  Patient reports no significant changes since last session and overall had a good weekend where he did his home exercise program often with no issues.  Patient has been working on walking up and down his driveway has worked on turning his head to therapy attention to all areas of his environment.  Patient encouraged to put increased focus on objects on the left side of his environment if he continues to progress with this.  Pt accompanied by: significant other wife : [name]   PERTINENT  HISTORY:   Per ED visit 09/05/22:   URI ARCHILLA is a 72 y.o. male with medical history significant of IIDM, HTN, HLD, MGUS, chronic thrombocytopenia, sent from PCPs office for stroke workup.  Patient symptoms started March 21st after he sustained a mechanical fall at his stop when he hit his left shoulder and left rib cage.  No fracture or dislocation.  Since then the patient has had episodes of clumsiness and unsteady gait and had another 3 episodes of fall at home at different times.  Family also reported that the patient appeared to have memory issue after the initial event in March, as patient started to decreased short memory and complained about worsening of his visions.  Denies any numbness weakness of any of the limbs, no choking or cough or swallowing problems.  Today patient went to see PCP who ordered a outpatient MRI which turned positive for acute right PCA stroke and PCP asked patient to come to ED right away.  Family also reported poorly controlled blood pressure and glucose at home recently.  Pt stroke effected his vision and he has difficulty with peripheral vision and depth perception on the left.  From PT eval:  Pt suffered  RC tear on the left side on 07/13/22 and he is being treated for this via workman's compensation. Has been confirmed with MRI. Pt had stroke on 09/05/22 and went to [facility] ER and was in hospital for 2 days. Pt ambulates with SPC on the R currently. 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 Pt reports his job is more tedious than strenuous. Pt wife reports difficulty with walking in diagonal patterns.   PAIN:  Are you having pain? Yes: NPRS scale: 0/10 Pain location: Left shoulder - being treated  Aggravating factors: rolling over in  bed, over use of shoulder  Relieving factors: rest  PRECAUTIONS: Fall  WEIGHT BEARING RESTRICTIONS: No  FALLS: Has patient fallen in last 6 months? Yes. 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   LIVING ENVIRONMENT: Lives with: lives with their family and lives with their spouse Lives in: House/apartment Stairs: Yes: Internal: steps to basement but does not need to use  steps; on right going up and External: 2 steps; on left going up Has following equipment at home: Single point cane, Quad cane large base, Walker - 2 wheeled, shower chair, and Grab bars  PLOF: Independent  PATIENT GOALS: go back to work, see pertinent history and subjective   OBJECTIVE: (objective measures completed at initial evaluation unless otherwise dated)  DIAGNOSTIC FINDINGS:  IMPRESSION: 1. Acute right PCA distribution infarct. 2. Brain atrophy asymmetrically affecting the anterior right temporal lobe and correlating with the history of memory loss.  COGNITION: Overall cognitive status: Within functional limits for tasks assessed     COORDINATION: Not assessed, assess UE coordination at future session    POSTURE: rounded shoulders  LOWER EXTREMITY ROM:      (Blank rows = not tested)  LOWER EXTREMITY MMT:    MMT  Right Eval Left Eval  Hip flexion 4+ 4  Hip extension    Hip abduction 4+ 4+  Hip adduction 5 5  Hip internal rotation    Hip external rotation    Knee flexion 4+ 4  Knee extension 5 4+  Ankle dorsiflexion 4+ 4+  Ankle plantarflexion    Ankle inversion    Ankle eversion    (Blank rows = not tested)  BED MOBILITY:  WNL per report   TRANSFERS: Assistive device utilized: Single point cane  Sit to stand: Modified independence Stand to sit: Modified independence Chair to chair: Modified independence Floor:  not assessed    RAMP:  Not assessed   CURB:  Not assessed   STAIRS: Level of Assistance: CGA Stair Negotiation Technique: Step to Pattern with Bilateral Rails Number of Stairs: 4  Height of Stairs: 6 in  Comments: on first step pt foot not properly on step, no LOB  GAIT: Gait pattern: decreased step length- Right, decreased stance time- Right, decreased stride length, and decreased hip/knee flexion- Left Distance walked: 30 ft Assistive device utilized: Single point cane Level of assistance: Modified independence Comments: ambulates with SPC normally but completes DGI with cane.   FUNCTIONAL TESTS:  5 times sit to stand: 14.98 sec 6 minute walk test: test visit 2 10 meter walk test: test visit 2  Dynamic Gait Index: 11     PATIENT SURVEYS:  FOTO 62 risk adjusted goal of 77  TODAY'S TREATMENT:                                                                                                                              Exercise/Activity Sets/Reps/Time/ Resistance Assistance Charge type Comments- Unless otherwise stated, CGA was provided and gait belt donned in order to ensure pt safety   Step taps X 10 ea LE  NMR To increase focus on peripheral vision and foot placement   Ambulation with head turns  *150 ft   NMR Looking for words on walls  2/11 when looking for "sticky notes" in his left visual field   Blaze pods in semi circle anterior to patient.  *1 minute rounds X 3 sets    UE, CGA  NMR Red for right hand and blue for left hand  Frequent inattention to the left side requiring cues for attention to peripheral of left vision.   Blaze pods for 15 hit count in semicircle ant to patient  4 sets of 15 hit count  SPC, CGA  NMR Red for right hand and blue for left hand  Frequent inattention to the left side requiring cues for attention to peripheral of left vision. Improved each round measured in time.     NBOS on airex X 30 sec 3 rounds   NMR Close CGA when getting on / off of airex pad    Split stance balance with head nods (sup/inf) 2*30 sec rounds.   NMR     Rationale for Evaluation and Treatment Rehabilitation  Pt educated throughout session about proper posture and technique with exercises. Improved exercise technique, movement at target joints, use of target muscles after min to mod verbal, visual, tactile cues. Note: Portions of this document were prepared using Dragon voice recognition software and although reviewed may contain unintentional dictation errors in syntax, grammar, or spelling.      PATIENT EDUCATION: Education details: POC Person educated: Patient Education method: Explanation Education comprehension: verbalized understanding  HOME EXERCISE PROGRAM: Access Code: [email or web address] URL: [email or web address] Date: 09/19/2022 Prepared by: Thresa Ross  Exercises - Standing March with Counter Support  - 1 x daily - 7 x weekly - 2 sets - 25 reps - Side Stepping with Counter Support  - 1 x daily - 7 x weekly - 2 sets - 12 reps - standing in split stance with vertical head nods   - 1 x daily - 7 x weekly - 2 sets - 15 reps - Walking with Head Rotation  - 1 x daily - 7 x weekly  GOALS: Goals reviewed with patient? Yes  SHORT TERM GOALS: Target date: 10/16/2022        Patient will be independent in home exercise program to improve strength/mobility for better functional independence with ADLs. Baseline: No HEP currently  Goal status: INITIAL     LONG TERM GOALS: Target date: 12/11/2022   1.  Patient will increase FOTO score to equal to or greater than  73   to demonstrate statistically significant improvement in mobility and quality of life.  Baseline: 62 Goal status: INITIAL   2.  Patient will increase Dynamic gait index Balance score by > 8 points to demonstrate decreased fall risk during functional activities. Baseline: 11 Goal status: INITIAL    3.   Patient will increase 10 meter walk test to >1.75m/s as to improve gait  speed for better community ambulation and to reduce fall risk. Baseline: test visit 2  Goal status: INITIAL  4.   Patient will increase six minute walk test distance to >1000 for progression to community ambulator and improve gait ability Baseline: test visit 2  Goal status: INITIAL     ASSESSMENT:  CLINICAL IMPRESSION: Patient presents to physical therapy for initial treatment session.  Patient continues to struggle with neglecting objects on his left side particularly in the peripheral of his vision on the left side.  With cues patient is able to acknowledge objects in this area.  Patient also shows improvement of recognition based on time for completion of 15 blaze pod taps with each repetition. The patient demonstrated significant progress while utilizing [employer], showcasing improved coordination, balance, and cognitive function. The incorporation of dual-tasking technology with color recognition and association with specific movements in Blaze Pods was strategically chosen to provide a dynamic training environment, enabling the patient to engage in simultaneous physical and cognitive tasks. This unique approach enhances not only their physical abilities but also fosters increased neural connectivity and mental awareness, contributing to a well-rounded and effective rehabilitation and training experience. Pt  will continue to benefit from skilled physical therapy intervention to address impairments, improve QOL, and attain therapy goals.    OBJECTIVE IMPAIRMENTS: Abnormal gait, decreased activity tolerance, decreased balance, decreased endurance, decreased mobility, difficulty walking, and decreased strength.   ACTIVITY LIMITATIONS: carrying, lifting, standing, stairs, and locomotion level  PARTICIPATION LIMITATIONS: driving, shopping, community activity, occupation, and yard work  PERSONAL FACTORS: Age, Fitness, and 3+ comorbidities: HTN, DM, farmer's lung, HLD  are also affecting  patient's functional outcome.   REHAB POTENTIAL: Good  CLINICAL DECISION MAKING: Evolving/moderate complexity  EVALUATION COMPLEXITY: Moderate  PLAN:  PT FREQUENCY: 2x/week  PT DURATION: 12 weeks  PLANNED INTERVENTIONS: Therapeutic exercises, Therapeutic activity, Neuromuscular re-education, Balance training, Gait training, Patient/Family education, Self Care, Joint mobilization, Stair training, and Manual therapy  PLAN FOR NEXT SESSION: ensure pt has cognitive ability to plug things in in high stress environment   Norman Herrlich, PT 09/22/2022, 1:50 PM

## 2022-09-25 ENCOUNTER — Encounter: Payer: Self-pay | Admitting: Physical Therapy

## 2022-09-25 ENCOUNTER — Ambulatory Visit: Payer: Medicare HMO | Admitting: Physical Therapy

## 2022-09-25 DIAGNOSIS — R262 Difficulty in walking, not elsewhere classified: Secondary | ICD-10-CM | POA: Diagnosis not present

## 2022-09-25 DIAGNOSIS — R2681 Unsteadiness on feet: Secondary | ICD-10-CM

## 2022-09-25 DIAGNOSIS — M6281 Muscle weakness (generalized): Secondary | ICD-10-CM | POA: Diagnosis not present

## 2022-09-25 DIAGNOSIS — R269 Unspecified abnormalities of gait and mobility: Secondary | ICD-10-CM | POA: Diagnosis not present

## 2022-09-25 DIAGNOSIS — R2689 Other abnormalities of gait and mobility: Secondary | ICD-10-CM

## 2022-09-25 NOTE — Therapy (Signed)
OUTPATIENT PHYSICAL THERAPY NEURO TREATMENT   Patient Name: Dennis Wise MRN: [id number] DOB:11/15/1950, 72 y.o., male Today's Date: 09/25/2022   PCP: Dennis Nam, MD  REFERRING PROVIDER: [name], MD   END OF SESSION:  PT End of Session - 09/25/22 1308     Visit Number 4    Number of Visits 24    Date for PT Re-Evaluation 12/11/22    Progress Note Due on Visit 10    PT Start Time 1300    PT Stop Time 1344    PT Time Calculation (min) 44 min    Equipment Utilized During Treatment Gait belt    Activity Tolerance Patient tolerated treatment well    Behavior During Therapy Flat affect;WFL for tasks assessed/performed             Past Medical History:  Diagnosis Date   Anemia    Iron deficiency part of this   Cataract    bilateral lens implants   Diabetes mellitus    type II   [name] (HCC)    GERD (gastroesophageal reflux disease) 01/1989   Helicobacter pylori gastritis 06/11/2011   On EGD 05/2011    Hyperlipidemia    Hypertension 11/1999   Internal hemorrhoids    Iron deficiency anemia, unspecified 03/04/2011   MGUS (monoclonal gammopathy of unknown significance)    possible dx initially, had negative f/u   Pancytopenia, acquired (HCC)    Personal history of colonic adenomas 06/05/2012   Pneumonia    Past Surgical History:  Procedure Laterality Date   CATARACT EXTRACTION Bilateral    COLONOSCOPY     ESOPHAGOGASTRODUODENOSCOPY     2012 H. pylori gastritis   FLEXIBLE SIGMOIDOSCOPY  05/1988   internal hemorrhoids   KNEE SURGERY  12/2002   lt knee fx repair ORIF   TIBIA FRACTURE SURGERY     left 2012   Patient Active Problem List   Diagnosis Date Noted   Acute ischemic right PCA stroke (HCC) 09/05/2022   Stroke (HCC) 09/05/2022   Memory change 09/03/2022   Shoulder pain 07/02/2022   [name] (HCC) 03/27/2022   Monoclonal B-cell lymphocytosis 01/15/2022   Weight loss 01/15/2022   Syncope 11/22/2021   Nocturia 08/18/2021    IDA (iron deficiency anemia) 07/24/2021   Thrombocytopenia (HCC) 05/03/2021   Normocytic anemia 04/03/2021   Unstable ankle 09/12/2020   Back pain 09/12/2020   Dupuytren contracture 03/03/2019   Healthcare maintenance 02/03/2017   Pseudophakia, both eyes 07/17/2016   Combined form of senile cataract of both eyes 05/02/2016   Hearing loss 10/20/2014   Obesity (BMI 30-39.9) 10/16/2013   Advance care planning 10/16/2013   Actinic keratosis 10/16/2013   Personal history of colonic adenomas 06/05/2012   Pancytopenia, acquired (HCC) 05/01/2011   Medicare annual wellness visit, subsequent 10/24/2010   FOOT PAIN, RIGHT 01/08/2010   Diabetic retinopathy (HCC) 03/21/2005   Essential hypertension 11/20/1999   GERD 01/19/1989   DM type 2 with diabetic background retinopathy (HCC) 04/21/1988   HYPERCHOLESTEROLEMIA  ([id number]) 1990 04/21/1988    ONSET DATE: 07/10/22  REFERRING DIAG: W29.56 (ICD-10-CM) - History of CVA in adulthood   THERAPY DIAG:  Abnormality of gait and mobility  Difficulty in walking, not elsewhere classified  Muscle weakness (generalized)  Other abnormalities of gait and mobility  Unsteadiness on feet  Rationale for Evaluation and Treatment: Rehabilitation  SUBJECTIVE:  SUBJECTIVE STATEMENT:  Patient reports continued difficulty with return to work. He states he feels he is 99% better but his wife and work do not agree and it is frustrating for him.   Pt accompanied by: significant other wife : Dennis Wise   PERTINENT HISTORY:   Per ED visit 09/05/22:   Dennis Wise is a 72 y.o. male with medical history significant of IIDM, HTN, HLD, MGUS, chronic thrombocytopenia, sent from PCPs office for stroke workup.  Patient symptoms started March 21st after he sustained a mechanical fall  at his stop when he hit his left shoulder and left rib cage.  No fracture or dislocation.  Since then the patient has had episodes of clumsiness and unsteady gait and had another 3 episodes of fall at home at different times.  Family also reported that the patient appeared to have memory issue after the initial event in March, as patient started to decreased short memory and complained about worsening of his visions.  Denies any numbness weakness of any of the limbs, no choking or cough or swallowing problems.  Today patient went to see PCP who ordered a outpatient MRI which turned positive for acute right PCA stroke and PCP asked patient to come to ED right away.  Family also reported poorly controlled blood pressure and glucose at home recently.  Pt stroke effected his vision and he has difficulty with peripheral vision and depth perception on the left.  From PT eval:  Pt suffered  RC tear on the left side on 07/13/22 and he is being treated for this via workman's compensation. Has been confirmed with MRI. Pt had stroke on 09/05/22 and went to [facility] ER and was in hospital for 2 days. Pt ambulates with SPC on the R currently. 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 Pt reports his job is more tedious than strenuous. Pt wife reports difficulty with walking in diagonal patterns.   PAIN:  Are you having pain? Yes: NPRS scale: 0/10 Pain location: Left shoulder - being treated  Aggravating factors: rolling over in bed, over use of shoulder  Relieving factors: rest  PRECAUTIONS: Fall  WEIGHT BEARING RESTRICTIONS: No  FALLS: Has patient fallen in last 6 months? Yes. Number of falls 3.24 when RC injury happened, fell feeding cows when he got knocked over, fell walking in  parking garage over parking lot object, and fell again leaning over trailer.   LIVING ENVIRONMENT: Lives with: lives with their family and lives with their spouse Lives in: House/apartment Stairs: Yes: Internal: steps to basement but does not need to use  steps; on right going up and External: 2 steps; on left going up Has following equipment at home: Single point cane, Quad cane large base, Walker - 2 wheeled, shower chair, and Grab bars  PLOF: Independent  PATIENT GOALS: go back to work, see pertinent history and subjective   OBJECTIVE: (objective measures completed at initial evaluation unless otherwise dated)  DIAGNOSTIC FINDINGS:   IMPRESSION: 1. Acute right PCA distribution infarct. 2. Brain atrophy asymmetrically affecting the anterior right temporal lobe and correlating with the history of memory loss.  COGNITION: Overall cognitive status: Within functional limits for tasks assessed     COORDINATION: Not  assessed, assess UE coordination at future session    POSTURE: rounded shoulders  LOWER EXTREMITY ROM:      (Blank rows = not tested)  LOWER EXTREMITY MMT:    MMT  Right Eval Left Eval  Hip flexion 4+ 4  Hip extension    Hip abduction 4+ 4+  Hip adduction 5 5  Hip internal rotation    Hip external rotation    Knee flexion 4+ 4  Knee extension 5 4+  Ankle dorsiflexion 4+ 4+  Ankle plantarflexion    Ankle inversion    Ankle eversion    (Blank rows = not tested)  BED MOBILITY:  WNL per report   TRANSFERS: Assistive device utilized: Single point cane  Sit to stand: Modified independence Stand to sit: Modified independence Chair to chair: Modified independence Floor:  not assessed   RAMP:  Not assessed   CURB:  Not assessed   STAIRS: Level of Assistance: CGA Stair Negotiation Technique: Step to Pattern with Bilateral Rails Number of Stairs: 4  Height of Stairs: 6 in  Comments: on first step pt foot not properly on step, no  LOB  GAIT: Gait pattern: decreased step length- Right, decreased stance time- Right, decreased stride length, and decreased hip/knee flexion- Left Distance walked: 30 ft Assistive device utilized: Single point cane Level of assistance: Modified independence Comments: ambulates with SPC normally but completes DGI with cane.   FUNCTIONAL TESTS:  5 times sit to stand: 14.98 sec 6 minute walk test: test visit 2 10 meter walk test: test visit 2  Dynamic Gait Index: 11     PATIENT SURVEYS:  FOTO 62 risk adjusted goal of 77  TODAY'S TREATMENT:                                                                                                                              Exercise/Activity Sets/Reps/Time/ Resistance Assistance Charge type Comments- Unless otherwise stated, CGA was provided and gait belt donned in order to ensure pt safety   Blaze pod sequence  From L to R with 5 pods x 6 ronds with 3 rounds ea UE)   NMR To increase focus on peripheral vision and foot placement   Ambulation with head turns  *150 ft with left then 150 ft with right turns   NMR Looking for words on walls  2/11 when looking for "sticky notes" in his left visual field   Blaze pods in semi circle anterior to patient.  6 rounds*20 hit count   UE, CGA  NMR Red for right hand and blue for left hand  Inattention to the left side requiring cues for attention to peripheral of left vision but Improved this date form last session On some reps focus on mirror ant for reintegration of L side visual field   Ambulation with left turns with visual scanning for objects on L visual field X 300 feet with 7 cones   NMR Good recognition of  objects in distance, challenged with reaching to the left side.   Blaze pods - looking in mirror only  6 rounds X 20 hits   NMR First round unable to focus on mirror with frequent looking down at hands, with cues and practice and feedback pt shows improved ability to follow hand in mirror for  reintegration of the left visual field     Rationale for Evaluation and Treatment Rehabilitation  Pt educated throughout session about proper posture and technique with exercises. Improved exercise technique, movement at target joints, use of target muscles after min to mod verbal, visual, tactile cues. Note: Portions of this document were prepared using Dragon voice recognition software and although reviewed may contain unintentional dictation errors in syntax, grammar, or spelling.      PATIENT EDUCATION: Education details: POC Person educated: Patient Education method: Explanation Education comprehension: verbalized understanding  HOME EXERCISE PROGRAM: Access Code: [email or web address] URL: [email or web address] Date: 09/19/2022 Prepared by: Thresa Ross  Exercises - Standing March with Counter Support  - 1 x daily - 7 x weekly - 2 sets - 25 reps - Side Stepping with Counter Support  - 1 x daily - 7 x weekly - 2 sets - 12 reps - standing in split stance with vertical head nods   - 1 x daily - 7 x weekly - 2 sets - 15 reps - Walking with Head Rotation  - 1 x daily - 7 x weekly  GOALS: Goals reviewed with patient? Yes  SHORT TERM GOALS: Target date: 10/16/2022        Patient will be independent in home exercise program to improve strength/mobility for better functional independence with ADLs. Baseline: No HEP currently  Goal status: INITIAL     LONG TERM GOALS: Target date: 12/11/2022   1.  Patient will increase FOTO score to equal to or greater than  73   to demonstrate statistically significant improvement in mobility and quality of life.  Baseline: 62 Goal status: INITIAL   2.  Patient will increase Dynamic gait index Balance score by > 8 points to demonstrate decreased fall risk during functional activities. Baseline: 11 Goal status: INITIAL    3.   Patient will increase 10 meter walk test to >1.38m/s as to improve gait speed for better community  ambulation and to reduce fall risk. Baseline: test visit 2  Goal status: INITIAL  4.   Patient will increase six minute walk test distance to >1000 for progression to community ambulator and improve gait ability Baseline: test visit 2  Goal status: INITIAL     ASSESSMENT:  CLINICAL IMPRESSION: Patient presents to physical therapy for initial treatment session.  Patient continues to struggle with neglecting objects on his left side particularly in the peripheral of his vision on the left side.  With cues patient is able to acknowledge objects in this area.  Patient also shows improvement of recognition based on time for completion of 15 blaze pod taps with each repetition. The patient demonstrated significant progress while utilizing [employer], showcasing improved cognitive function. The incorporation of dual-tasking technology with color recognition and association with specific movements in Blaze Pods was strategically chosen to provide a dynamic training environment, enabling the patient to engage in simultaneous physical and cognitive tasks. This unique approach enhances not only their physical abilities but also fosters increased neural connectivity and mental awareness, contributing to a well-rounded and effective rehabilitation and training experience. Pt will continue to benefit from skilled physical therapy intervention to address impairments,  improve QOL, and attain therapy goals.    OBJECTIVE IMPAIRMENTS: Abnormal gait, decreased activity tolerance, decreased balance, decreased endurance, decreased mobility, difficulty walking, and decreased strength.   ACTIVITY LIMITATIONS: carrying, lifting, standing, stairs, and locomotion level  PARTICIPATION LIMITATIONS: driving, shopping, community activity, occupation, and yard work  PERSONAL FACTORS: Age, Fitness, and 3+ comorbidities: HTN, DM, farmer's lung, HLD  are also affecting patient's functional outcome.   REHAB POTENTIAL:  Good  CLINICAL DECISION MAKING: Evolving/moderate complexity  EVALUATION COMPLEXITY: Moderate  PLAN:  PT FREQUENCY: 2x/week  PT DURATION: 12 weeks  PLANNED INTERVENTIONS: Therapeutic exercises, Therapeutic activity, Neuromuscular re-education, Balance training, Gait training, Patient/Family education, Self Care, Joint mobilization, Stair training, and Manual therapy  PLAN FOR NEXT SESSION: ensure pt has cognitive ability to plug things in in high stress environment, obstacle course navigating around objects ( more for vision than for balance so turns and tight space navigation)    Norman Herrlich, PT 09/25/2022, 1:09 PM

## 2022-09-26 ENCOUNTER — Telehealth: Payer: Self-pay

## 2022-09-26 NOTE — Progress Notes (Signed)
Called patient to reschedule his cancelled initial appointment with Al Corpus, PharmD on 09/29/2022 at 9:00  No answer; left message.  Al Corpus, PharmD notified  Claudina Lick, [place] Clinical Pharmacy Assistant [phone]

## 2022-09-27 DIAGNOSIS — D68318 Other hemorrhagic disorder due to intrinsic circulating anticoagulants, antibodies, or inhibitors: Secondary | ICD-10-CM | POA: Insufficient documentation

## 2022-09-27 DIAGNOSIS — M25562 Pain in left knee: Secondary | ICD-10-CM

## 2022-09-27 HISTORY — DX: Pain in left knee: M25.562

## 2022-09-27 HISTORY — DX: Other hemorrhagic disorder due to intrinsic circulating anticoagulants, antibodies, or inhibitors: D68.318

## 2022-09-29 ENCOUNTER — Ambulatory Visit: Payer: Medicare HMO | Admitting: Physical Therapy

## 2022-09-29 ENCOUNTER — Encounter: Payer: Medicare HMO | Admitting: Pharmacist

## 2022-09-29 ENCOUNTER — Encounter: Payer: Self-pay | Admitting: Physical Therapy

## 2022-09-29 DIAGNOSIS — R2681 Unsteadiness on feet: Secondary | ICD-10-CM | POA: Diagnosis not present

## 2022-09-29 DIAGNOSIS — R269 Unspecified abnormalities of gait and mobility: Secondary | ICD-10-CM | POA: Diagnosis not present

## 2022-09-29 DIAGNOSIS — R262 Difficulty in walking, not elsewhere classified: Secondary | ICD-10-CM | POA: Diagnosis not present

## 2022-09-29 DIAGNOSIS — R2689 Other abnormalities of gait and mobility: Secondary | ICD-10-CM

## 2022-09-29 DIAGNOSIS — M6281 Muscle weakness (generalized): Secondary | ICD-10-CM

## 2022-09-29 NOTE — Therapy (Signed)
OUTPATIENT PHYSICAL THERAPY NEURO TREATMENT   Patient Name: Dennis Wise MRN: [id number] DOB:January 01, 1951, 72 y.o., male Today's Date: 09/29/2022   PCP: [name], MD  REFERRING PROVIDER: [name], MD   END OF SESSION:  PT End of Session - 09/29/22 1012     Visit Number 5    Number of Visits 24    Date for PT Re-Evaluation 12/11/22    Progress Note Due on Visit 10    PT Start Time 1016    PT Stop Time 1101    PT Time Calculation (min) 45 min    Equipment Utilized During Treatment Gait belt    Activity Tolerance Patient tolerated treatment well    Behavior During Therapy Flat affect;WFL for tasks assessed/performed              Past Medical History:  Diagnosis Date   Anemia    Iron deficiency part of this   Cataract    bilateral lens implants   Diabetes mellitus    type II   [name] (HCC)    GERD (gastroesophageal reflux disease) 01/1989   Helicobacter pylori gastritis 06/11/2011   On EGD 05/2011    Hyperlipidemia    Hypertension 11/1999   Internal hemorrhoids    Iron deficiency anemia, unspecified 03/04/2011   MGUS (monoclonal gammopathy of unknown significance)    possible dx initially, had negative f/u   Pancytopenia, acquired (HCC)    Personal history of colonic adenomas 06/05/2012   Pneumonia    Past Surgical History:  Procedure Laterality Date   CATARACT EXTRACTION Bilateral    COLONOSCOPY     ESOPHAGOGASTRODUODENOSCOPY     2012 H. pylori gastritis   FLEXIBLE SIGMOIDOSCOPY  05/1988   internal hemorrhoids   KNEE SURGERY  12/2002   lt knee fx repair ORIF   TIBIA FRACTURE SURGERY     left 2012   Patient Active Problem List   Diagnosis Date Noted   Acute ischemic right PCA stroke (HCC) 09/05/2022   Stroke (HCC) 09/05/2022   Memory change 09/03/2022   Shoulder pain 07/02/2022   [name] (HCC) 03/27/2022   Monoclonal B-cell lymphocytosis 01/15/2022   Weight loss 01/15/2022   Syncope 11/22/2021   Nocturia 08/18/2021    IDA (iron deficiency anemia) 07/24/2021   Thrombocytopenia (HCC) 05/03/2021   Normocytic anemia 04/03/2021   Unstable ankle 09/12/2020   Back pain 09/12/2020   Dupuytren contracture 03/03/2019   Healthcare maintenance 02/03/2017   Pseudophakia, both eyes 07/17/2016   Combined form of senile cataract of both eyes 05/02/2016   Hearing loss 10/20/2014   Obesity (BMI 30-39.9) 10/16/2013   Advance care planning 10/16/2013   Actinic keratosis 10/16/2013   Personal history of colonic adenomas 06/05/2012   Pancytopenia, acquired (HCC) 05/01/2011   Medicare annual wellness visit, subsequent 10/24/2010   FOOT PAIN, RIGHT 01/08/2010   Diabetic retinopathy (HCC) 03/21/2005   Essential hypertension 11/20/1999   GERD 01/19/1989   DM type 2 with diabetic background retinopathy (HCC) 04/21/1988   HYPERCHOLESTEROLEMIA  ([id number]) 1990 04/21/1988    ONSET DATE: 07/10/22  REFERRING DIAG: W29.56 (ICD-10-CM) - History of CVA in adulthood   THERAPY DIAG:  Abnormality of gait and mobility  Difficulty in walking, not elsewhere classified  Muscle weakness (generalized)  Other abnormalities of gait and mobility  Rationale for Evaluation and Treatment: Rehabilitation  SUBJECTIVE:  SUBJECTIVE STATEMENT: Pt reports having a fall on Thursday afternoon (6/06) of last week when getting up to go to the bathroom. Pt and [facility] went to [facility] and where pt states it is just bruising and soreness in his L knee. At end of session, therapist spoke with patient and pt's wife about referral to Neuro-ophthalmologist to further address L visual field deficit. They will bring up possible referral to provider during PCP visit tomorrow 6/11.     Pt accompanied by: significant other wife : [name]   PERTINENT HISTORY:   Per  ED visit 09/05/22:   Dennis Wise is a 72 y.o. male with medical history significant of IIDM, HTN, HLD, MGUS, chronic thrombocytopenia, sent from PCPs office for stroke workup.  Patient symptoms started March 21st after he sustained a mechanical fall at his stop when he hit his left shoulder and left rib cage.  No fracture or dislocation.  Since then the patient has had episodes of clumsiness and unsteady gait and had another 3 episodes of fall at home at different times.  Family also reported that the patient appeared to have memory issue after the initial event in March, as patient started to decreased short memory and complained about worsening of his visions.  Denies any numbness weakness of any of the limbs, no choking or cough or swallowing problems.  Today patient went to see PCP who ordered a outpatient MRI which turned positive for acute right PCA stroke and PCP asked patient to come to ED right away.  Family also reported poorly controlled blood pressure and glucose at home recently.  Pt stroke effected his vision and he has difficulty with peripheral vision and depth perception on the left.  From PT eval:  Pt suffered  RC tear on the left side on 07/13/22 and he is being treated for this via workman's compensation. Has been confirmed with MRI. Pt had stroke on 09/05/22 and went to [facility] ER and was in hospital for 2 days. Pt ambulates with SPC on the R currently. 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 Pt reports his job is more tedious than strenuous. Pt wife reports difficulty with walking in diagonal patterns.   PAIN:  Are you having pain? Yes: NPRS scale: 0/10 Pain location: Left shoulder - being treated  Aggravating factors: rolling over in bed, over use of  shoulder  Relieving factors: rest  PRECAUTIONS: Fall  WEIGHT BEARING RESTRICTIONS: No  FALLS: Has patient fallen in last 6 months? Yes. 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   LIVING ENVIRONMENT: Lives with: lives with their family and lives with their spouse Lives in: House/apartment Stairs: Yes: Internal: steps to basement but does not need to use  steps; on right going up and External: 2 steps; on left going up Has following equipment at home: Single point cane, Quad cane large base, Walker - 2 wheeled, shower chair, and Grab bars  PLOF: Independent  PATIENT GOALS: go back to work, see pertinent history and subjective   OBJECTIVE: (objective measures completed at initial evaluation unless otherwise dated)  DIAGNOSTIC FINDINGS:   IMPRESSION: 1. Acute right PCA distribution infarct. 2. Brain atrophy asymmetrically affecting the anterior right temporal lobe and correlating with the history of memory loss.  COGNITION: Overall cognitive status: Within functional limits for tasks assessed     COORDINATION: Not assessed, assess UE coordination at future session    POSTURE: rounded shoulders  LOWER EXTREMITY ROM:      (Blank rows = not tested)  LOWER EXTREMITY MMT:    MMT  Right Eval Left Eval  Hip flexion 4+ 4  Hip extension    Hip abduction 4+ 4+  Hip adduction 5 5  Hip internal rotation    Hip external rotation    Knee flexion 4+ 4  Knee extension 5 4+  Ankle dorsiflexion 4+ 4+  Ankle plantarflexion    Ankle inversion    Ankle eversion    (Blank rows = not tested)  BED MOBILITY:  WNL per report   TRANSFERS: Assistive device utilized: Single point cane  Sit to stand: Modified independence Stand to sit: Modified independence Chair to chair: Modified independence Floor:  not assessed   RAMP:  Not assessed    CURB:  Not assessed   STAIRS: Level of Assistance: CGA Stair Negotiation Technique: Step to Pattern with Bilateral Rails Number of Stairs: 4  Height of Stairs: 6 in  Comments: on first step pt foot not properly on step, no LOB  GAIT: Gait pattern: decreased step length- Right, decreased stance time- Right, decreased stride length, and decreased hip/knee flexion- Left Distance walked: 30 ft Assistive device utilized: Single point cane Level of assistance: Modified independence Comments: ambulates with SPC normally but completes DGI with cane.   FUNCTIONAL TESTS:  5 times sit to stand: 14.98 sec 6 minute walk test: test visit 2 10 meter walk test: test visit 2  Dynamic Gait Index: 11     PATIENT SURVEYS:  FOTO 62 risk adjusted goal of 77  TODAY'S TREATMENT:                                                                                                                               Exercise/Activity Sets/Reps/Time/ Resistance Assistance Charge type Comments- Unless otherwise stated, CGA was provided and gait belt donned in order to ensure pt safety   Blaze pod random 5 pods, 20 hits x3 rounds. Utilizing raised surface for 1 pod in far left visual field.   NMR To increase focus on peripheral vision and foot placement   Ambulation around Bolsters in line 5 ft apart, progression to amb through cones in hall 3 bolsters initially at 2 laps, progression to 4 cones at 3 laps CGA NMR Note slower navigation of cones than bolsters. Pt req' more frequent cues for appropriate navigation around cones.           Ambulation with left turns with visual scanning for objects on L visual field X 300 feet with 9 cones, 6 found on first  lap, 3 found on 2nd lap  NMR Good recognition of objects in distance, challenged with reaching to the left side.           Rationale for Evaluation and Treatment Rehabilitation  Pt educated throughout session about proper posture and technique with exercises.  Improved exercise technique, movement at target joints, use of target muscles after min to mod verbal, visual, tactile cues. Note: Portions of this document were prepared using Dragon voice recognition software and although reviewed may contain unintentional dictation errors in syntax, grammar, or spelling.      PATIENT EDUCATION: Education details: POC Person educated: Patient Education method: Explanation Education comprehension: verbalized understanding  HOME EXERCISE PROGRAM: Access Code: [email or web address] URL: [email or web address] Date: 09/19/2022 Prepared by: Thresa Ross  Exercises - Standing March with Counter Support  - 1 x daily - 7 x weekly - 2 sets - 25 reps - Side Stepping with Counter Support  - 1 x daily - 7 x weekly - 2 sets - 12 reps - standing in split stance with vertical head nods   - 1 x daily - 7 x weekly - 2 sets - 15 reps - Walking with Head Rotation  - 1 x daily - 7 x weekly  GOALS: Goals reviewed with patient? Yes  SHORT TERM GOALS: Target date: 10/16/2022        Patient will be independent in home exercise program to improve strength/mobility for better functional independence with ADLs. Baseline: No HEP currently  Goal status: INITIAL     LONG TERM GOALS: Target date: 12/11/2022   1.  Patient will increase FOTO score to equal to or greater than  73   to demonstrate statistically significant improvement in mobility and quality of life.  Baseline: 62 Goal status: INITIAL   2.  Patient will increase Dynamic gait index Balance score by > 8 points to demonstrate decreased fall risk during functional activities. Baseline: 11 Goal status: INITIAL    3.   Patient will increase 10 meter walk test to >1.57m/s as to improve gait speed for better community ambulation and to reduce fall risk. Baseline: test visit 2  Goal status: INITIAL  4.   Patient will increase six minute walk test distance to >1000 for progression to community  ambulator and improve gait ability Baseline: test visit 2  Goal status: INITIAL     ASSESSMENT:  CLINICAL IMPRESSION:   Patient presents to physical therapy for treatment session. Patient continues to struggle with neglecting objects on his left side particularly in the peripheral of his vision on the left side. Patient showing improvement in L visual field recognition evidenced by quicker reaction times to L side during Blaze pod activity. Incorporated overground walking with obstacle navigation through bolsters with progression to cones. Finished session with visual scanning during ambulation to L side to increase L visual field awareness. Spoke with patient and pt's wife about referral to Neuro-ophthalmologist to further address L visual field. Pt will continue to benefit from skilled physical therapy intervention to address impairments, improve QOL, and attain therapy goals.    OBJECTIVE IMPAIRMENTS: Abnormal gait, decreased activity tolerance, decreased balance, decreased endurance, decreased mobility, difficulty walking, and decreased strength.   ACTIVITY LIMITATIONS: carrying, lifting, standing, stairs, and locomotion level  PARTICIPATION LIMITATIONS: driving, shopping, community activity, occupation, and yard work  PERSONAL FACTORS: Age, Fitness, and 3+ comorbidities: HTN, DM, farmer's lung, HLD  are also affecting patient's functional outcome.   REHAB POTENTIAL: Good  CLINICAL DECISION MAKING: Evolving/moderate complexity  EVALUATION COMPLEXITY: Moderate  PLAN:  PT FREQUENCY: 2x/week  PT DURATION: 12 weeks  PLANNED INTERVENTIONS: Therapeutic exercises, Therapeutic activity, Neuromuscular re-education, Balance training, Gait training, Patient/Family education, Self Care, Joint mobilization, Stair training, and Manual therapy  PLAN FOR NEXT SESSION: ensure pt has cognitive ability to plug things in in high stress environment, obstacle course navigating around objects ( more  for vision than for balance so turns and tight space navigation)    Nani Gasser, Student-PT 09/29/2022, 11:28 AM  This entire session was performed under direct supervision and direction of a licensed therapist/therapist assistant . I have personally read, edited and approve of the note as written.    This licensed clinician was present and actively directing care throughout the session at all times.  Norman Herrlich PT ,DPT Physical Therapist- [place]  [facility]

## 2022-09-30 ENCOUNTER — Encounter: Payer: Self-pay | Admitting: Family Medicine

## 2022-09-30 ENCOUNTER — Ambulatory Visit (INDEPENDENT_AMBULATORY_CARE_PROVIDER_SITE_OTHER): Payer: Medicare HMO | Admitting: Family Medicine

## 2022-09-30 VITALS — BP 136/50 | HR 60 | Temp 98.7°F | Ht 73.0 in | Wt 244.0 lb

## 2022-09-30 DIAGNOSIS — I10 Essential (primary) hypertension: Secondary | ICD-10-CM

## 2022-09-30 DIAGNOSIS — F419 Anxiety disorder, unspecified: Secondary | ICD-10-CM | POA: Diagnosis not present

## 2022-09-30 DIAGNOSIS — Z8673 Personal history of transient ischemic attack (TIA), and cerebral infarction without residual deficits: Secondary | ICD-10-CM

## 2022-09-30 LAB — CBC WITH DIFFERENTIAL/PLATELET
Basophils Absolute: 0 10*3/uL (ref 0.0–0.1)
Basophils Relative: 0.9 % (ref 0.0–3.0)
Eosinophils Absolute: 0.1 10*3/uL (ref 0.0–0.7)
Eosinophils Relative: 2.1 % (ref 0.0–5.0)
HCT: 32.5 % — ABNORMAL LOW (ref 39.0–52.0)
Hemoglobin: 11.5 g/dL — ABNORMAL LOW (ref 13.0–17.0)
Lymphocytes Relative: 19 % (ref 12.0–46.0)
Lymphs Abs: 1 10*3/uL (ref 0.7–4.0)
MCHC: 35.4 g/dL (ref 30.0–36.0)
MCV: 87.1 fl (ref 78.0–100.0)
Monocytes Absolute: 0.6 10*3/uL (ref 0.1–1.0)
Monocytes Relative: 11.1 % (ref 3.0–12.0)
Neutro Abs: 3.5 10*3/uL (ref 1.4–7.7)
Neutrophils Relative %: 66.9 % (ref 43.0–77.0)
Platelets: 127 10*3/uL — ABNORMAL LOW (ref 150.0–400.0)
RBC: 3.73 Mil/uL — ABNORMAL LOW (ref 4.22–5.81)
RDW: 16 % — ABNORMAL HIGH (ref 11.5–15.5)
WBC: 5.2 10*3/uL (ref 4.0–10.5)

## 2022-09-30 LAB — COMPREHENSIVE METABOLIC PANEL
ALT: 19 U/L (ref 0–53)
AST: 24 U/L (ref 0–37)
Albumin: 3.9 g/dL (ref 3.5–5.2)
Alkaline Phosphatase: 142 U/L — ABNORMAL HIGH (ref 39–117)
BUN: 33 mg/dL — ABNORMAL HIGH (ref 6–23)
CO2: 29 mEq/L (ref 19–32)
Calcium: 9.3 mg/dL (ref 8.4–10.5)
Chloride: 94 mEq/L — ABNORMAL LOW (ref 96–112)
Creatinine, Ser: 1.42 mg/dL (ref 0.40–1.50)
GFR: 48.77 mL/min — ABNORMAL LOW (ref >60.00)
Glucose, Bld: 215 mg/dL — ABNORMAL HIGH (ref 70–99)
Potassium: 4.9 mEq/L (ref 3.5–5.1)
Sodium: 128 mEq/L — ABNORMAL LOW (ref 135–145)
Total Bilirubin: 0.7 mg/dL (ref 0.2–1.2)
Total Protein: 6.5 g/dL (ref 6.0–8.3)

## 2022-09-30 LAB — PHOSPHORUS: Phosphorus: 3.2 mg/dL (ref 2.3–4.6)

## 2022-09-30 LAB — MAGNESIUM: Magnesium: 1.6 mg/dL (ref 1.5–2.5)

## 2022-09-30 MED ORDER — CLOPIDOGREL BISULFATE 75 MG PO TABS: 75.0000 mg | ORAL_TABLET | Freq: Every day | ORAL | 3 refills | Status: DC

## 2022-09-30 MED ORDER — LOSARTAN POTASSIUM 100 MG PO TABS: 100.0000 mg | ORAL_TABLET | Freq: Every day | ORAL | 3 refills | Status: DC

## 2022-09-30 NOTE — Patient Instructions (Signed)
Go to the lab on the way out.   If you have mychart we'll likely use that to update you.    Plan on recheck here in about 3 months.  Labs at the visit.  Take care.  Glad to see you.

## 2022-09-30 NOTE — Progress Notes (Unsigned)
H/o CVA.  D/w pt about prev imaging and rationale for meds.    Abrasion L knee from fall at home a few days ago.  Fall cautions d/w pt.  Had also scraped L shin, covered with bandaid.  In OT/PT.    He is doing home exercises.  Using a cane at baseline.    Discussed mood changes since CVA.  He is frustrated about current work limitations.  The last few nights have been better.  Discussed that mood changes could have been from the people/disability and also from the stroke itself, or both.  Discussed is going to gradually return to work, though it does not appear that he is able to work at this point.  He didn't tolerate hydralazine, d/w pt.    Meds, vitals, and allergies reviewed.   ROS: Per HPI unless specifically indicated in ROS section   GEN: nad, alert and oriented, speech norma HEENT: ncat NECK: supple w/o LA CV: rrr.  PULM: ctab, no inc wob ABD: soft, +bs EXT: trace BLE edema SKIN: no acute rash

## 2022-10-01 ENCOUNTER — Encounter: Payer: Self-pay | Admitting: Neurology

## 2022-10-01 ENCOUNTER — Ambulatory Visit: Payer: Medicare HMO | Admitting: Neurology

## 2022-10-01 VITALS — BP 166/66 | HR 59 | Ht 74.0 in | Wt 247.5 lb

## 2022-10-01 DIAGNOSIS — Z8673 Personal history of transient ischemic attack (TIA), and cerebral infarction without residual deficits: Secondary | ICD-10-CM | POA: Insufficient documentation

## 2022-10-01 DIAGNOSIS — I63531 Cerebral infarction due to unspecified occlusion or stenosis of right posterior cerebral artery: Secondary | ICD-10-CM | POA: Diagnosis not present

## 2022-10-01 DIAGNOSIS — F411 Generalized anxiety disorder: Secondary | ICD-10-CM

## 2022-10-01 DIAGNOSIS — F419 Anxiety disorder, unspecified: Secondary | ICD-10-CM | POA: Insufficient documentation

## 2022-10-01 HISTORY — DX: Generalized anxiety disorder: F41.1

## 2022-10-01 NOTE — Assessment & Plan Note (Signed)
His anxiety is manageable at this point.  No suicidal or homicidal intent.  Still okay for outpatient follow-up.  We did not want to increase his fall risk with antianxiety medications.  He agreed.  At this point still okay for outpatient follow-up.  30 minutes were devoted to patient care in this encounter (this includes time spent reviewing the patient's file/history, interviewing and examining the patient, counseling/reviewing plan with patient).

## 2022-10-01 NOTE — Assessment & Plan Note (Signed)
Blood pressure controlled.  He did not tolerate hydralazine.  See notes on follow-up labs.
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
## 2022-10-01 NOTE — Assessment & Plan Note (Signed)
Recheck labs pending.  See notes on labs.  Rationale for current medications discussed with patient.  Continue aspirin and Plavix for now.  Reasonable to continue Namenda for now.  He hopes to return to work.  Continue with OT/PT.  Fall cautions discussed with patient.

## 2022-10-02 ENCOUNTER — Ambulatory Visit: Payer: Medicare HMO | Admitting: Physical Therapy

## 2022-10-02 ENCOUNTER — Encounter: Payer: Self-pay | Admitting: Physical Therapy

## 2022-10-02 DIAGNOSIS — R2681 Unsteadiness on feet: Secondary | ICD-10-CM | POA: Diagnosis not present

## 2022-10-02 DIAGNOSIS — R2689 Other abnormalities of gait and mobility: Secondary | ICD-10-CM

## 2022-10-02 DIAGNOSIS — R262 Difficulty in walking, not elsewhere classified: Secondary | ICD-10-CM

## 2022-10-02 DIAGNOSIS — R269 Unspecified abnormalities of gait and mobility: Secondary | ICD-10-CM

## 2022-10-02 DIAGNOSIS — M6281 Muscle weakness (generalized): Secondary | ICD-10-CM

## 2022-10-02 NOTE — Therapy (Signed)
OUTPATIENT PHYSICAL THERAPY NEURO TREATMENT   Patient Name: Dennis Wise MRN: [id number] DOB:31-Jan-1951, 72 y.o., male Today's Date: 10/02/2022   PCP: [name], MD  REFERRING PROVIDER: [name], MD   END OF SESSION:  PT End of Session - 10/02/22 1459     Visit Number 6    Number of Visits 24    Date for PT Re-Evaluation 12/11/22    Progress Note Due on Visit 10    PT Start Time 1346    PT Stop Time 1429    PT Time Calculation (min) 43 min    Equipment Utilized During Treatment Gait belt    Activity Tolerance Patient tolerated treatment well    Behavior During Therapy Flat affect;WFL for tasks assessed/performed               Past Medical History:  Diagnosis Date   Anemia    Iron deficiency part of this   Cataract    bilateral lens implants   Diabetes mellitus    type II   [name] (HCC)    GERD (gastroesophageal reflux disease) 01/1989   Helicobacter pylori gastritis 06/11/2011   On EGD 05/2011    Hyperlipidemia    Hypertension 11/1999   Internal hemorrhoids    Iron deficiency anemia, unspecified 03/04/2011   MGUS (monoclonal gammopathy of unknown significance)    possible dx initially, had negative f/u   Pancytopenia, acquired (HCC)    Personal history of colonic adenomas 06/05/2012   Pneumonia    Stroke [facility])    Past Surgical History:  Procedure Laterality Date   CATARACT EXTRACTION Bilateral    COLONOSCOPY     ESOPHAGOGASTRODUODENOSCOPY     2012 H. pylori gastritis   FLEXIBLE SIGMOIDOSCOPY  05/1988   internal hemorrhoids   KNEE SURGERY  12/2002   lt knee fx repair ORIF   TIBIA FRACTURE SURGERY     left 2012   Patient Active Problem List   Diagnosis Date Noted   History of CVA in adulthood 10/01/2022   Anxiety 10/01/2022   Acute ischemic right PCA stroke (HCC) 09/05/2022   Stroke (HCC) 09/05/2022   Memory change 09/03/2022   Shoulder pain 07/02/2022   [name] (HCC) 03/27/2022   Monoclonal B-cell lymphocytosis  01/15/2022   Weight loss 01/15/2022   Syncope 11/22/2021   Bilateral carotid artery stenosis 09/23/2021   Nocturia 08/18/2021   IDA (iron deficiency anemia) 07/24/2021   Thrombocytopenia (HCC) 05/03/2021   Normocytic anemia 04/03/2021   Unstable ankle 09/12/2020   Back pain 09/12/2020   Dupuytren contracture 03/03/2019   Healthcare maintenance 02/03/2017   Pseudophakia, both eyes 07/17/2016   Combined form of senile cataract of both eyes 05/02/2016   Hearing loss 10/20/2014   Obesity (BMI 30-39.9) 10/16/2013   Advance care planning 10/16/2013   Actinic keratosis 10/16/2013   Personal history of colonic adenomas 06/05/2012   Pancytopenia, acquired (HCC) 05/01/2011   Medicare annual wellness visit, subsequent 10/24/2010   FOOT PAIN, RIGHT 01/08/2010   Diabetic retinopathy (HCC) 03/21/2005   Essential hypertension 11/20/1999   GERD 01/19/1989   DM type 2 with diabetic background retinopathy (HCC) 04/21/1988   HYPERCHOLESTEROLEMIA  ([id number]) 1990 04/21/1988    ONSET DATE: 07/10/22  REFERRING DIAG: W09.81 (ICD-10-CM) - History of CVA in adulthood   THERAPY DIAG:  Abnormality of gait and mobility  Difficulty in walking, not elsewhere classified  Muscle weakness (generalized)  Other abnormalities of gait and mobility  Unsteadiness on feet  Rationale for Evaluation  and Treatment: Rehabilitation  SUBJECTIVE:                                                                                                                                                                                             SUBJECTIVE STATEMENT:  Pt has been doing well since last session, reports no falls either.  pt and wife reports uneventful discussion last week at provider visit about referral to Neuro-ophthalmologist. Provided pt and wife a form for provider to fill out for referral to Neuro-ophthalmologist.  Pt accompanied by: significant other wife : [name]   PERTINENT HISTORY:   Per ED visit  09/05/22:   Dennis Wise is a 71 y.o. male with medical history significant of IIDM, HTN, HLD, MGUS, chronic thrombocytopenia, sent from PCPs office for stroke workup.  Patient symptoms started March 21st after he sustained a mechanical fall at his stop when he hit his left shoulder and left rib cage.  No fracture or dislocation.  Since then the patient has had episodes of clumsiness and unsteady gait and had another 3 episodes of fall at home at different times.  Family also reported that the patient appeared to have memory issue after the initial event in March, as patient started to decreased short memory and complained about worsening of his visions.  Denies any numbness weakness of any of the limbs, no choking or cough or swallowing problems.  Today patient went to see PCP who ordered a outpatient MRI which turned positive for acute right PCA stroke and PCP asked patient to come to ED right away.  Family also reported poorly controlled blood pressure and glucose at home recently.  Pt stroke effected his vision and he has difficulty with peripheral vision and depth perception on the left.  From PT eval:  Pt suffered  RC tear on the left side on 07/13/22 and he is being treated for this via workman's compensation. Has been confirmed with MRI. Pt had stroke on 09/05/22 and went to [facility] ER and was in hospital for 2 days. Pt ambulates with SPC on the R currently. 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 Pt reports his job is more tedious than strenuous. Pt wife reports difficulty with walking in diagonal patterns.   PAIN:  Are you having pain? Yes: NPRS scale: 0/10 Pain location: Left shoulder - being  treated  Aggravating factors: rolling over in bed, over use of shoulder   Relieving factors: rest  PRECAUTIONS: Fall  WEIGHT BEARING RESTRICTIONS: No  FALLS: Has patient fallen in last 6 months? Yes. 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   LIVING ENVIRONMENT: Lives with: lives with their family and lives with their spouse Lives in: House/apartment Stairs: Yes: Internal: steps to basement but does not need to use  steps; on right going up and External: 2 steps; on left going up Has following equipment at home: Single point cane, Quad cane large base, Walker - 2 wheeled, shower chair, and Grab bars  PLOF: Independent  PATIENT GOALS: go back to work, see pertinent history and subjective   OBJECTIVE: (objective measures completed at initial evaluation unless otherwise dated)  DIAGNOSTIC FINDINGS:   IMPRESSION: 1. Acute right PCA distribution infarct. 2. Brain atrophy asymmetrically affecting the anterior right temporal lobe and correlating with the history of memory loss.  COGNITION: Overall cognitive status: Within functional limits for tasks assessed     COORDINATION: Not assessed, assess UE coordination at future session    POSTURE: rounded shoulders  LOWER EXTREMITY ROM:      (Blank rows = not tested)  LOWER EXTREMITY MMT:    MMT  Right Eval Left Eval  Hip flexion 4+ 4  Hip extension    Hip abduction 4+ 4+  Hip adduction 5 5  Hip internal rotation    Hip external rotation    Knee flexion 4+ 4  Knee extension 5 4+  Ankle dorsiflexion 4+ 4+  Ankle plantarflexion    Ankle inversion    Ankle eversion    (Blank rows = not tested)  BED MOBILITY:  WNL per report   TRANSFERS: Assistive device utilized: Single point cane  Sit to stand: Modified independence Stand to sit: Modified independence Chair to chair: Modified independence Floor:  not assessed   RAMP:  Not assessed   CURB:   Not assessed   STAIRS: Level of Assistance: CGA Stair Negotiation Technique: Step to Pattern with Bilateral Rails Number of Stairs: 4  Height of Stairs: 6 in  Comments: on first step pt foot not properly on step, no LOB  GAIT: Gait pattern: decreased step length- Right, decreased stance time- Right, decreased stride length, and decreased hip/knee flexion- Left Distance walked: 30 ft Assistive device utilized: Single point cane Level of assistance: Modified independence Comments: ambulates with SPC normally but completes DGI with cane.   FUNCTIONAL TESTS:  5 times sit to stand: 14.98 sec 6 minute walk test: test visit 2 10 meter walk test: test visit 2  Dynamic Gait Index: 11     PATIENT SURVEYS:  FOTO 62 risk adjusted goal of 77  TODAY'S TREATMENT:  Exercise/Activity Sets/Reps/Time/ Resistance Assistance Charge type Comments- Unless otherwise stated, CGA was provided and gait belt donned in order to ensure pt safety   Blaze pod random 4 pods, 25 hits x3 rounds. Utilizing raised surface for 1 pod in far left visual field.   NMR To increase focus on peripheral vision and foot placement   Ambulation course, utilizing 2 different tracks:  Track 1: step up in //, 4" set up of to airex pad and hold for 30 secs, leave // navigate through 2 cones using SPC.  Track 2: Step up in //, step over 1/2 foam roll,  leave // navigate through 2 cones using SPC. 5 laps on each track, tapering off cues/ directions as laps progressed CGA NMR   Blaze pod random 4 pods, 25 hits x3 rounds. Changing pod location between each round   NMR To increase focus on peripheral vision and foot placement                 Rationale for Evaluation and Treatment Rehabilitation  Pt educated throughout session about proper posture and technique with exercises. Improved exercise technique,  movement at target joints, use of target muscles after min to mod verbal, visual, tactile cues. Note: Portions of this document were prepared using Dragon voice recognition software and although reviewed may contain unintentional dictation errors in syntax, grammar, or spelling.      PATIENT EDUCATION: Education details: POC Person educated: Patient Education method: Explanation Education comprehension: verbalized understanding  HOME EXERCISE PROGRAM: Access Code: [email or web address] URL: [email or web address] Date: 09/19/2022 Prepared by: Thresa Ross  Exercises - Standing March with Counter Support  - 1 x daily - 7 x weekly - 2 sets - 25 reps - Side Stepping with Counter Support  - 1 x daily - 7 x weekly - 2 sets - 12 reps - standing in split stance with vertical head nods   - 1 x daily - 7 x weekly - 2 sets - 15 reps - Walking with Head Rotation  - 1 x daily - 7 x weekly  GOALS: Goals reviewed with patient? Yes  SHORT TERM GOALS: Target date: 10/16/2022        Patient will be independent in home exercise program to improve strength/mobility for better functional independence with ADLs. Baseline: No HEP currently  Goal status: INITIAL     LONG TERM GOALS: Target date: 12/11/2022   1.  Patient will increase FOTO score to equal to or greater than  73   to demonstrate statistically significant improvement in mobility and quality of life.  Baseline: 62 Goal status: INITIAL   2.  Patient will increase Dynamic gait index Balance score by > 8 points to demonstrate decreased fall risk during functional activities. Baseline: 11 Goal status: INITIAL    3.   Patient will increase 10 meter walk test to >1.45m/s as to improve gait speed for better community ambulation and to reduce fall risk. Baseline: test visit 2  Goal status: INITIAL  4.   Patient will increase six minute walk test distance to >1000 for progression to community ambulator and improve gait  ability Baseline: test visit 2  Goal status: INITIAL     ASSESSMENT:  CLINICAL IMPRESSION:   Patient presents to physical therapy for treatment session. Patient continues to struggle with neglecting objects on his left side particularly in the peripheral of his vision on the left side. Patient showing improvement in L visual field recognition evidenced by quicker reaction times to L side during  Blaze pod activity. Incorporated overground walking with obstacle course, utilizing stepping up, off, and over varying surfaces and heights. Spoke with patient and pt's wife about referral to Neuro-ophthalmologist referral and provided a form for provider to fill out. Pt will continue to benefit from skilled physical therapy intervention to address impairments, improve QOL, and attain therapy goals.    OBJECTIVE IMPAIRMENTS: Abnormal gait, decreased activity tolerance, decreased balance, decreased endurance, decreased mobility, difficulty walking, and decreased strength.   ACTIVITY LIMITATIONS: carrying, lifting, standing, stairs, and locomotion level  PARTICIPATION LIMITATIONS: driving, shopping, community activity, occupation, and yard work  PERSONAL FACTORS: Age, Fitness, and 3+ comorbidities: HTN, DM, farmer's lung, HLD  are also affecting patient's functional outcome.   REHAB POTENTIAL: Good  CLINICAL DECISION MAKING: Evolving/moderate complexity  EVALUATION COMPLEXITY: Moderate  PLAN:  PT FREQUENCY: 2x/week  PT DURATION: 12 weeks  PLANNED INTERVENTIONS: Therapeutic exercises, Therapeutic activity, Neuromuscular re-education, Balance training, Gait training, Patient/Family education, Self Care, Joint mobilization, Stair training, and Manual therapy  PLAN FOR NEXT SESSION: ensure pt has cognitive ability to plug things in in high stress environment, obstacle course navigating around objects ( more for vision than for balance so turns and tight space navigation)    Norman Herrlich,  PT 10/02/2022, 4:31 PM

## 2022-10-02 NOTE — Patient Instructions (Signed)
Continue with DAPT, aspirin and Plavix for total of 68-months, then continue with Plavix alone Continue other medication Continue with occupational and physical therapy Continue to follow-up at [facility] neurology

## 2022-10-05 ENCOUNTER — Other Ambulatory Visit: Payer: Self-pay | Admitting: Family Medicine

## 2022-10-05 DIAGNOSIS — E871 Hypo-osmolality and hyponatremia: Secondary | ICD-10-CM

## 2022-10-06 ENCOUNTER — Encounter: Payer: Self-pay | Admitting: Family Medicine

## 2022-10-06 ENCOUNTER — Ambulatory Visit: Payer: Medicare HMO | Admitting: Physical Therapy

## 2022-10-06 ENCOUNTER — Encounter: Payer: Self-pay | Admitting: Physical Therapy

## 2022-10-06 DIAGNOSIS — R2689 Other abnormalities of gait and mobility: Secondary | ICD-10-CM

## 2022-10-06 DIAGNOSIS — Z8673 Personal history of transient ischemic attack (TIA), and cerebral infarction without residual deficits: Secondary | ICD-10-CM

## 2022-10-06 DIAGNOSIS — R262 Difficulty in walking, not elsewhere classified: Secondary | ICD-10-CM | POA: Diagnosis not present

## 2022-10-06 DIAGNOSIS — M6281 Muscle weakness (generalized): Secondary | ICD-10-CM

## 2022-10-06 DIAGNOSIS — R2681 Unsteadiness on feet: Secondary | ICD-10-CM

## 2022-10-06 DIAGNOSIS — R269 Unspecified abnormalities of gait and mobility: Secondary | ICD-10-CM

## 2022-10-06 NOTE — Therapy (Deleted)
OUTPATIENT PHYSICAL THERAPY NEURO TREATMENT   Patient Name: Dennis Wise MRN: [id number] DOB:10-17-1950, 72 y.o., male Today's Date: 10/06/2022   PCP: [name], MD  REFERRING PROVIDER: [name], MD   END OF SESSION:      Past Medical History:  Diagnosis Date   Anemia    Iron deficiency part of this   Cataract    bilateral lens implants   Diabetes mellitus    type II   [name] (HCC)    GERD (gastroesophageal reflux disease) 01/1989   Helicobacter pylori gastritis 06/11/2011   On EGD 05/2011    Hyperlipidemia    Hypertension 11/1999   Internal hemorrhoids    Iron deficiency anemia, unspecified 03/04/2011   MGUS (monoclonal gammopathy of unknown significance)    possible dx initially, had negative f/u   Pancytopenia, acquired (HCC)    Personal history of colonic adenomas 06/05/2012   Pneumonia    Stroke [facility])    Past Surgical History:  Procedure Laterality Date   CATARACT EXTRACTION Bilateral    COLONOSCOPY     ESOPHAGOGASTRODUODENOSCOPY     2012 H. pylori gastritis   FLEXIBLE SIGMOIDOSCOPY  05/1988   internal hemorrhoids   KNEE SURGERY  12/2002   lt knee fx repair ORIF   TIBIA FRACTURE SURGERY     left 2012   Patient Active Problem List   Diagnosis Date Noted   History of CVA in adulthood 10/01/2022   Anxiety 10/01/2022   Acute ischemic right PCA stroke (HCC) 09/05/2022   Stroke (HCC) 09/05/2022   Memory change 09/03/2022   Shoulder pain 07/02/2022   Monoclonal B-cell lymphocytosis 01/15/2022   Weight loss 01/15/2022   Syncope 11/22/2021   Bilateral carotid artery stenosis 09/23/2021   Nocturia 08/18/2021   IDA (iron deficiency anemia) 07/24/2021   Thrombocytopenia (HCC) 05/03/2021   Normocytic anemia 04/03/2021   Unstable ankle 09/12/2020   Back pain 09/12/2020   Dupuytren contracture 03/03/2019   Healthcare maintenance 02/03/2017   Pseudophakia, both eyes 07/17/2016   Combined form of senile cataract of both eyes  05/02/2016   Hearing loss 10/20/2014   Obesity (BMI 30-39.9) 10/16/2013   Advance care planning 10/16/2013   Actinic keratosis 10/16/2013   Personal history of colonic adenomas 06/05/2012   Pancytopenia, acquired (HCC) 05/01/2011   Medicare annual wellness visit, subsequent 10/24/2010   FOOT PAIN, RIGHT 01/08/2010   Diabetic retinopathy (HCC) 03/21/2005   Essential hypertension 11/20/1999   GERD 01/19/1989   DM type 2 with diabetic background retinopathy (HCC) 04/21/1988   HYPERCHOLESTEROLEMIA  ([id number]) 1990 04/21/1988    ONSET DATE: 07/10/22  REFERRING DIAG: N82.95 (ICD-10-CM) - History of CVA in adulthood   THERAPY DIAG:  No diagnosis found.  Rationale for Evaluation and Treatment: Rehabilitation  SUBJECTIVE:  SUBJECTIVE STATEMENT:  Pt has been doing well since last session, reports no falls either.  pt and wife reports uneventful discussion last week at provider visit about referral to Neuro-ophthalmologist. Provided pt and wife a form for provider to fill out for referral to Neuro-ophthalmologist.  Pt accompanied by: significant other wife : [name]   PERTINENT HISTORY:   Per ED visit 09/05/22:   Dennis Wise is a 72 y.o. male with medical history significant of IIDM, HTN, HLD, MGUS, chronic thrombocytopenia, sent from PCPs office for stroke workup.  Patient symptoms started March 21st after he sustained a mechanical fall at his stop when he hit his left shoulder and left rib cage.  No fracture or dislocation.  Since then the patient has had episodes of clumsiness and unsteady gait and had another 3 episodes of fall at home at different times.  Family also reported that the patient appeared to have memory issue after the initial event in March, as patient started to decreased short memory  and complained about worsening of his visions.  Denies any numbness weakness of any of the limbs, no choking or cough or swallowing problems.  Today patient went to see PCP who ordered a outpatient MRI which turned positive for acute right PCA stroke and PCP asked patient to come to ED right away.  Family also reported poorly controlled blood pressure and glucose at home recently.  Pt stroke effected his vision and he has difficulty with peripheral vision and depth perception on the left.  From PT eval:  Pt suffered  RC tear on the left side on 07/13/22 and he is being treated for this via workman's compensation. Has been confirmed with MRI. Pt had stroke on 09/05/22 and went to [facility] ER and was in hospital for 2 days. Pt ambulates with SPC on the R currently. 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 Pt reports his job is more tedious than strenuous. Pt wife reports difficulty with walking in diagonal patterns.   PAIN:  Are you having pain? Yes: NPRS scale: 0/10 Pain location: Left shoulder - being treated  Aggravating factors: rolling over in bed, over use of shoulder  Relieving factors: rest  PRECAUTIONS: Fall  WEIGHT BEARING RESTRICTIONS: No  FALLS: Has patient fallen in last 6 months? Yes. 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   LIVING ENVIRONMENT: Lives with: lives with their family and lives with their spouse Lives in: House/apartment Stairs: Yes: Internal: steps to basement but does not need to use  steps; on right going up and External: 2 steps; on left going up Has following equipment at home: Single point cane,  Quad cane large base, Walker - 2 wheeled, shower chair, and Grab bars  PLOF: Independent  PATIENT GOALS: go back to work, see pertinent history and subjective   OBJECTIVE: (objective measures completed at initial evaluation unless otherwise dated)  DIAGNOSTIC FINDINGS:   IMPRESSION: 1. Acute right PCA distribution infarct. 2. Brain atrophy asymmetrically affecting the anterior right temporal lobe and correlating with the history of memory loss.  COGNITION: Overall cognitive status:  Within functional limits for tasks assessed     COORDINATION: Not assessed, assess UE coordination at future session    POSTURE: rounded shoulders  LOWER EXTREMITY ROM:      (Blank rows = not tested)  LOWER EXTREMITY MMT:    MMT  Right Eval Left Eval  Hip flexion 4+ 4  Hip extension    Hip abduction 4+ 4+  Hip adduction 5 5  Hip internal rotation    Hip external rotation    Knee flexion 4+ 4  Knee extension 5 4+  Ankle dorsiflexion 4+ 4+  Ankle plantarflexion    Ankle inversion    Ankle eversion    (Blank rows = not tested)  BED MOBILITY:  WNL per report   TRANSFERS: Assistive device utilized: Single point cane  Sit to stand: Modified independence Stand to sit: Modified independence Chair to chair: Modified independence Floor:  not assessed   RAMP:  Not assessed   CURB:  Not assessed   STAIRS: Level of Assistance: CGA Stair Negotiation Technique: Step to Pattern with Bilateral Rails Number of Stairs: 4  Height of Stairs: 6 in  Comments: on first step pt foot not properly on step, no LOB  GAIT: Gait pattern: decreased step length- Right, decreased stance time- Right, decreased stride length, and decreased hip/knee flexion- Left Distance walked: 30 ft Assistive device utilized: Single point cane Level of assistance: Modified independence Comments: ambulates with SPC normally but completes DGI with cane.   FUNCTIONAL TESTS:  5 times sit to stand: 14.98 sec 6  minute walk test: test visit 2 10 meter walk test: test visit 2  Dynamic Gait Index: 11     PATIENT SURVEYS:  FOTO 62 risk adjusted goal of 77  TODAY'S TREATMENT:                                                                                                                               Exercise/Activity Sets/Reps/Time/ Resistance Assistance Charge type Comments- Unless otherwise stated, CGA was provided and gait belt donned in order to ensure pt safety   Blaze pod random 4 pods, 25 hits x3 rounds. Utilizing raised surface for 1 pod in far left visual field.   NMR To increase focus on peripheral vision and foot placement   Ambulation course, utilizing 2 different tracks:  Track 1: step up in //, 4" set up of to airex pad and hold for 30 secs, leave // navigate through 2 cones using SPC.  Track 2: Step up in //, step over 1/2 foam roll,  leave // navigate through 2 cones using SPC. 5 laps on each track, tapering off cues/ directions as laps progressed CGA NMR   Blaze pod random 4 pods, 25 hits x3 rounds. Changing pod location between each round   NMR To increase focus on peripheral vision and foot placement                 Rationale  for Evaluation and Treatment Rehabilitation  Pt educated throughout session about proper posture and technique with exercises. Improved exercise technique, movement at target joints, use of target muscles after min to mod verbal, visual, tactile cues. Note: Portions of this document were prepared using Dragon voice recognition software and although reviewed may contain unintentional dictation errors in syntax, grammar, or spelling.      PATIENT EDUCATION: Education details: POC Person educated: Patient Education method: Explanation Education comprehension: verbalized understanding  HOME EXERCISE PROGRAM: Access Code: [email or web address] URL: [email or web address] Date: 09/19/2022 Prepared by: Thresa Ross  Exercises - Standing March with  Counter Support  - 1 x daily - 7 x weekly - 2 sets - 25 reps - Side Stepping with Counter Support  - 1 x daily - 7 x weekly - 2 sets - 12 reps - standing in split stance with vertical head nods   - 1 x daily - 7 x weekly - 2 sets - 15 reps - Walking with Head Rotation  - 1 x daily - 7 x weekly  GOALS: Goals reviewed with patient? Yes  SHORT TERM GOALS: Target date: 10/16/2022        Patient will be independent in home exercise program to improve strength/mobility for better functional independence with ADLs. Baseline: No HEP currently  Goal status: INITIAL     LONG TERM GOALS: Target date: 12/11/2022   1.  Patient will increase FOTO score to equal to or greater than  73   to demonstrate statistically significant improvement in mobility and quality of life.  Baseline: 62 Goal status: INITIAL   2.  Patient will increase Dynamic gait index Balance score by > 8 points to demonstrate decreased fall risk during functional activities. Baseline: 11 Goal status: INITIAL    3.   Patient will increase 10 meter walk test to >1.1m/s as to improve gait speed for better community ambulation and to reduce fall risk. Baseline: test visit 2  Goal status: INITIAL  4.   Patient will increase six minute walk test distance to >1000 for progression to community ambulator and improve gait ability Baseline: test visit 2  Goal status: INITIAL     ASSESSMENT:  CLINICAL IMPRESSION:  *** Patient presents to physical therapy for treatment session. Patient continues to struggle with neglecting objects on his left side particularly in the peripheral of his vision on the left side. Patient showing improvement in L visual field recognition evidenced by quicker reaction times to L side during Blaze pod activity. Incorporated overground walking with obstacle course, utilizing stepping up, off, and over varying surfaces and heights. Spoke with patient and pt's wife about referral to Neuro-ophthalmologist  referral and provided a form for provider to fill out. Pt will continue to benefit from skilled physical therapy intervention to address impairments, improve QOL, and attain therapy goals.    OBJECTIVE IMPAIRMENTS: Abnormal gait, decreased activity tolerance, decreased balance, decreased endurance, decreased mobility, difficulty walking, and decreased strength.   ACTIVITY LIMITATIONS: carrying, lifting, standing, stairs, and locomotion level  PARTICIPATION LIMITATIONS: driving, shopping, community activity, occupation, and yard work  PERSONAL FACTORS: Age, Fitness, and 3+ comorbidities: HTN, DM, farmer's lung, HLD  are also affecting patient's functional outcome.   REHAB POTENTIAL: Good  CLINICAL DECISION MAKING: Evolving/moderate complexity  EVALUATION COMPLEXITY: Moderate  PLAN:  PT FREQUENCY: 2x/week  PT DURATION: 12 weeks  PLANNED INTERVENTIONS: Therapeutic exercises, Therapeutic activity, Neuromuscular re-education, Balance training, Gait training, Patient/Family education, Self Care, Joint mobilization, Stair training, and Manual therapy  PLAN  FOR NEXT SESSION: ensure pt has cognitive ability to plug things in in high stress environment, obstacle course navigating around objects ( more for vision than for balance so turns and tight space navigation)    Nani Gasser, Student-PT 10/06/2022, 10:07 AM

## 2022-10-06 NOTE — Therapy (Signed)
OUTPATIENT PHYSICAL THERAPY NEURO TREATMENT   Patient Name: Dennis Wise MRN: [id number] DOB:September 27, 1950, 72 y.o., male Today's Date: 10/06/2022  PCP: Dennis Nam, MD  REFERRING PROVIDER: [name], MD   END OF SESSION:  PT End of Session - 10/06/22 1156     Visit Number 7    Number of Visits 24    Date for PT Re-Evaluation 12/11/22    Progress Note Due on Visit 10    PT Start Time 1147    PT Stop Time 1230    PT Time Calculation (min) 43 min    Equipment Utilized During Treatment Gait belt    Activity Tolerance Patient tolerated treatment well    Behavior During Therapy Flat affect;WFL for tasks assessed/performed                Past Medical History:  Diagnosis Date   Anemia    Iron deficiency part of this   Cataract    bilateral lens implants   Diabetes mellitus    type II   [name] (HCC)    GERD (gastroesophageal reflux disease) 01/1989   Helicobacter pylori gastritis 06/11/2011   On EGD 05/2011    Hyperlipidemia    Hypertension 11/1999   Internal hemorrhoids    Iron deficiency anemia, unspecified 03/04/2011   MGUS (monoclonal gammopathy of unknown significance)    possible dx initially, had negative f/u   Pancytopenia, acquired (HCC)    Personal history of colonic adenomas 06/05/2012   Pneumonia    Stroke [facility])    Past Surgical History:  Procedure Laterality Date   CATARACT EXTRACTION Bilateral    COLONOSCOPY     ESOPHAGOGASTRODUODENOSCOPY     2012 H. pylori gastritis   FLEXIBLE SIGMOIDOSCOPY  05/1988   internal hemorrhoids   KNEE SURGERY  12/2002   lt knee fx repair ORIF   TIBIA FRACTURE SURGERY     left 2012   Patient Active Problem List   Diagnosis Date Noted   History of CVA in adulthood 10/01/2022   Anxiety 10/01/2022   Acute ischemic right PCA stroke (HCC) 09/05/2022   Stroke (HCC) 09/05/2022   Memory change 09/03/2022   Shoulder pain 07/02/2022   Monoclonal B-cell lymphocytosis 01/15/2022   Weight loss  01/15/2022   Syncope 11/22/2021   Bilateral carotid artery stenosis 09/23/2021   Nocturia 08/18/2021   IDA (iron deficiency anemia) 07/24/2021   Thrombocytopenia (HCC) 05/03/2021   Normocytic anemia 04/03/2021   Unstable ankle 09/12/2020   Back pain 09/12/2020   Dupuytren contracture 03/03/2019   Healthcare maintenance 02/03/2017   Pseudophakia, both eyes 07/17/2016   Combined form of senile cataract of both eyes 05/02/2016   Hearing loss 10/20/2014   Obesity (BMI 30-39.9) 10/16/2013   Advance care planning 10/16/2013   Actinic keratosis 10/16/2013   Personal history of colonic adenomas 06/05/2012   Pancytopenia, acquired (HCC) 05/01/2011   Medicare annual wellness visit, subsequent 10/24/2010   FOOT PAIN, RIGHT 01/08/2010   Diabetic retinopathy (HCC) 03/21/2005   Essential hypertension 11/20/1999   GERD 01/19/1989   DM type 2 with diabetic background retinopathy (HCC) 04/21/1988   HYPERCHOLESTEROLEMIA  ([id number]) 1990 04/21/1988    ONSET DATE: 07/10/22  REFERRING DIAG: V25.36 (ICD-10-CM) - History of CVA in adulthood   THERAPY DIAG:  Abnormality of gait and mobility  Difficulty in walking, not elsewhere classified  Muscle weakness (generalized)  Unsteadiness on feet  Other abnormalities of gait and mobility  Rationale for Evaluation and Treatment: Rehabilitation  SUBJECTIVE:  SUBJECTIVE STATEMENT: Patient has been doing well since last session, notes no falls or significant changes since last session. Wife Dennis Wise mentioned she was able to get in contact with their provider to send a referral for the Neuro-ophthalmologist at [facility].  Pt accompanied by: significant other wife : Dennis Wise   PERTINENT HISTORY:   Per ED visit 09/05/22:   Dennis Wise is a 73 y.o. male with medical  history significant of IIDM, HTN, HLD, MGUS, chronic thrombocytopenia, sent from PCPs office for stroke workup.  Patient symptoms started March 21st after he sustained a mechanical fall at his stop when he hit his left shoulder and left rib cage.  No fracture or dislocation.  Since then the patient has had episodes of clumsiness and unsteady gait and had another 3 episodes of fall at home at different times.  Family also reported that the patient appeared to have memory issue after the initial event in March, as patient started to decreased short memory and complained about worsening of his visions.  Denies any numbness weakness of any of the limbs, no choking or cough or swallowing problems.  Today patient went to see PCP who ordered a outpatient MRI which turned positive for acute right PCA stroke and PCP asked patient to come to ED right away.  Family also reported poorly controlled blood pressure and glucose at home recently.  Pt stroke effected his vision and he has difficulty with peripheral vision and depth perception on the left.  From PT eval:  Pt suffered  RC tear on the left side on 07/13/22 and he is being treated for this via workman's compensation. Has been confirmed with MRI. Pt had stroke on 09/05/22 and went to [facility] ER and was in hospital for 2 days. Pt ambulates with SPC on the R currently. 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 Pt reports his job is more tedious than strenuous. Pt wife reports difficulty with walking in diagonal patterns.   PAIN:  Are you having pain? Yes: NPRS scale: 0/10 Pain location: Left shoulder - being treated  Aggravating factors: rolling over in bed, over use of shoulder  Relieving factors: rest  PRECAUTIONS: Fall  WEIGHT  BEARING RESTRICTIONS: No  FALLS: Has patient fallen in last 6 months? Yes. 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   LIVING ENVIRONMENT: Lives with: lives with their family and lives with their spouse Lives in: House/apartment Stairs: Yes: Internal: steps to basement but does not need to use  steps; on right going up and External: 2 steps; on left going up Has following equipment at home: Single point cane, Quad cane large base, Walker - 2 wheeled, shower chair, and Grab bars  PLOF: Independent  PATIENT GOALS: go back to work, see pertinent history and subjective   OBJECTIVE: (objective measures completed at initial evaluation unless otherwise dated)  DIAGNOSTIC FINDINGS:   IMPRESSION: 1. Acute right PCA distribution infarct. 2. Brain atrophy asymmetrically affecting the anterior right temporal lobe and correlating with the history of memory loss.  COGNITION: Overall cognitive status: Within functional limits for  tasks assessed     COORDINATION: Not assessed, assess UE coordination at future session    POSTURE: rounded shoulders  LOWER EXTREMITY ROM:      (Blank rows = not tested)  LOWER EXTREMITY MMT:    MMT  Right Eval Left Eval  Hip flexion 4+ 4  Hip extension    Hip abduction 4+ 4+  Hip adduction 5 5  Hip internal rotation    Hip external rotation    Knee flexion 4+ 4  Knee extension 5 4+  Ankle dorsiflexion 4+ 4+  Ankle plantarflexion    Ankle inversion    Ankle eversion    (Blank rows = not tested)  BED MOBILITY:  WNL per report   TRANSFERS: Assistive device utilized: Single point cane  Sit to stand: Modified independence Stand to sit: Modified independence Chair to chair: Modified independence Floor:  not assessed   RAMP:  Not assessed   CURB:  Not assessed   STAIRS: Level of Assistance:  CGA Stair Negotiation Technique: Step to Pattern with Bilateral Rails Number of Stairs: 4  Height of Stairs: 6 in  Comments: on first step pt foot not properly on step, no LOB  GAIT: Gait pattern: decreased step length- Right, decreased stance time- Right, decreased stride length, and decreased hip/knee flexion- Left Distance walked: 30 ft Assistive device utilized: Single point cane Level of assistance: Modified independence Comments: ambulates with SPC normally but completes DGI with cane.   FUNCTIONAL TESTS:  5 times sit to stand: 14.98 sec 6 minute walk test: test visit 2 10 meter walk test: test visit 2  Dynamic Gait Index: 11     PATIENT SURVEYS:  FOTO 62 risk adjusted goal of 77  TODAY'S TREATMENT:                                                                                                                               Exercise/Activity Sets/Reps/Time/ Resistance Assistance Charge type Comments- Unless otherwise stated, CGA was provided and gait belt donned in order to ensure pt safety   Blaze pod random 5 pods, 25 hits x3 rounds. Utilizing raised surface for 1 pod in far left visual field.   NMR To increase focus on peripheral vision and foot placement   Ambulation through cone pathway, laps down and back.   6 cones, 6x laps CGA NMR Cues to keep orientation and directions between cones.  Blaze pod random 5 pods, 25 hits x3 rounds, 2 pods located on floor. Changing pod location between each round   NMR To increase focus on peripheral vision and foot placement, ensuring pt can identify and recognize Pods placed on floor.  Cones course set up through lap around clinic and in hallway, 2x laps total. 7 cones collected per lap, pt missing 1-2 cones per lap.   Cues to continuously scan environment. Placed cones in more difficult areas to find on round 2  Rationale for Evaluation and Treatment Rehabilitation  Pt educated throughout session about proper posture and  technique with exercises. Improved exercise technique, movement at target joints, use of target muscles after min to mod verbal, visual, tactile cues. Note: Portions of this document were prepared using Dragon voice recognition software and although reviewed may contain unintentional dictation errors in syntax, grammar, or spelling.      PATIENT EDUCATION: Education details: POC Person educated: Patient Education method: Explanation Education comprehension: verbalized understanding  HOME EXERCISE PROGRAM: Access Code: [email or web address] URL: [email or web address] Date: 09/19/2022 Prepared by: Thresa Ross  Exercises - Standing March with Counter Support  - 1 x daily - 7 x weekly - 2 sets - 25 reps - Side Stepping with Counter Support  - 1 x daily - 7 x weekly - 2 sets - 12 reps - standing in split stance with vertical head nods   - 1 x daily - 7 x weekly - 2 sets - 15 reps - Walking with Head Rotation  - 1 x daily - 7 x weekly  GOALS: Goals reviewed with patient? Yes  SHORT TERM GOALS: Target date: 10/16/2022        Patient will be independent in home exercise program to improve strength/mobility for better functional independence with ADLs. Baseline: No HEP currently  Goal status: INITIAL     LONG TERM GOALS: Target date: 12/11/2022   1.  Patient will increase FOTO score to equal to or greater than  73   to demonstrate statistically significant improvement in mobility and quality of life.  Baseline: 62 Goal status: INITIAL   2.  Patient will increase Dynamic gait index Balance score by > 8 points to demonstrate decreased fall risk during functional activities. Baseline: 11 Goal status: INITIAL    3.   Patient will increase 10 meter walk test to >1.83m/s as to improve gait speed for better community ambulation and to reduce fall risk. Baseline: test visit 2  Goal status: INITIAL  4.   Patient will increase six minute walk test distance to >1000 for  progression to community ambulator and improve gait ability Baseline: test visit 2  Goal status: INITIAL     ASSESSMENT:  CLINICAL IMPRESSION:  Patient presents to physical therapy for treatment session. Patient continues to struggle with neglecting objects on his left side particularly in the peripheral of his vision on the left side. Continue with forced awareness and cueing to assess L visual field via various exercises. Pt exhibits inability to remember pathways made for exercises, requiring consistent cues for directions. Pt will continue to benefit from skilled physical therapy intervention to address impairments, improve QOL, and attain therapy goals.    OBJECTIVE IMPAIRMENTS: Abnormal gait, decreased activity tolerance, decreased balance, decreased endurance, decreased mobility, difficulty walking, and decreased strength.   ACTIVITY LIMITATIONS: carrying, lifting, standing, stairs, and locomotion level  PARTICIPATION LIMITATIONS: driving, shopping, community activity, occupation, and yard work  PERSONAL FACTORS: Age, Fitness, and 3+ comorbidities: HTN, DM, farmer's lung, HLD  are also affecting patient's functional outcome.   REHAB POTENTIAL: Good  CLINICAL DECISION MAKING: Evolving/moderate complexity  EVALUATION COMPLEXITY: Moderate  PLAN:  PT FREQUENCY: 2x/week  PT DURATION: 12 weeks  PLANNED INTERVENTIONS: Therapeutic exercises, Therapeutic activity, Neuromuscular re-education, Balance training, Gait training, Patient/Family education, Self Care, Joint mobilization, Stair training, and Manual therapy  PLAN FOR NEXT SESSION: ensure pt has cognitive ability to plug things in in high stress environment, obstacle course navigating around objects ( more for vision than for balance  so turns and tight space navigation)    Nani Gasser, Student-PT 10/06/2022, 12:38 PM  This entire session was performed under direct supervision and direction of a licensed therapist/therapist  assistant . I have personally read, edited and approve of the note as written.    This licensed clinician was present and actively directing care throughout the session at all times.  Norman Herrlich PT ,DPT Physical Therapist- [place]  [facility]

## 2022-10-07 ENCOUNTER — Other Ambulatory Visit: Payer: Medicare HMO

## 2022-10-07 DIAGNOSIS — E871 Hypo-osmolality and hyponatremia: Secondary | ICD-10-CM

## 2022-10-08 LAB — OSMOLALITY, URINE: Osmolality, Ur: 386 mOsm/kg (ref 50–1200)

## 2022-10-08 LAB — SODIUM, URINE, RANDOM: Sodium, Ur: 29 mmol/L (ref 28–272)

## 2022-10-09 ENCOUNTER — Ambulatory Visit: Payer: Medicare HMO | Admitting: Physical Therapy

## 2022-10-09 ENCOUNTER — Other Ambulatory Visit: Payer: Self-pay | Admitting: Family Medicine

## 2022-10-09 DIAGNOSIS — R269 Unspecified abnormalities of gait and mobility: Secondary | ICD-10-CM

## 2022-10-09 DIAGNOSIS — R2689 Other abnormalities of gait and mobility: Secondary | ICD-10-CM | POA: Diagnosis not present

## 2022-10-09 DIAGNOSIS — R262 Difficulty in walking, not elsewhere classified: Secondary | ICD-10-CM

## 2022-10-09 DIAGNOSIS — R2681 Unsteadiness on feet: Secondary | ICD-10-CM | POA: Diagnosis not present

## 2022-10-09 DIAGNOSIS — M6281 Muscle weakness (generalized): Secondary | ICD-10-CM | POA: Diagnosis not present

## 2022-10-09 DIAGNOSIS — E871 Hypo-osmolality and hyponatremia: Secondary | ICD-10-CM

## 2022-10-09 NOTE — Therapy (Signed)
OUTPATIENT PHYSICAL THERAPY NEURO TREATMENT   Patient Name: Dennis [place] MRN: [id number] DOB:04/05/1951, 72 y.o., male Today's Date: 10/10/2022  PCP: Dennis Nam, MD  REFERRING PROVIDER: [name], MD   END OF SESSION:  PT End of Session - 10/09/22 1553     Visit Number 8    Number of Visits 24    Date for PT Re-Evaluation 12/11/22    Progress Note Due on Visit 10    PT Start Time 1601    PT Stop Time 1642    PT Time Calculation (min) 41 min    Equipment Utilized During Treatment Gait belt    Activity Tolerance Patient tolerated treatment well    Behavior During Therapy Flat affect;WFL for tasks assessed/performed                 Past Medical History:  Diagnosis Date   Anemia    Iron deficiency part of this   Cataract    bilateral lens implants   Diabetes mellitus    type II   [name] (HCC)    GERD (gastroesophageal reflux disease) 01/1989   Helicobacter pylori gastritis 06/11/2011   On EGD 05/2011    Hyperlipidemia    Hypertension 11/1999   Internal hemorrhoids    Iron deficiency anemia, unspecified 03/04/2011   MGUS (monoclonal gammopathy of unknown significance)    possible dx initially, had negative f/u   Pancytopenia, acquired (HCC)    Personal history of colonic adenomas 06/05/2012   Pneumonia    Stroke Dennis [place])    Past Surgical History:  Procedure Laterality Date   CATARACT EXTRACTION Bilateral    COLONOSCOPY     ESOPHAGOGASTRODUODENOSCOPY     2012 H. pylori gastritis   FLEXIBLE SIGMOIDOSCOPY  05/1988   internal hemorrhoids   KNEE SURGERY  12/2002   lt knee fx repair ORIF   TIBIA FRACTURE SURGERY     left 2012   Patient Active Problem List   Diagnosis Date Noted   History of CVA in adulthood 10/01/2022   Anxiety 10/01/2022   Acute ischemic right PCA stroke (HCC) 09/05/2022   Stroke (HCC) 09/05/2022   Memory change 09/03/2022   Shoulder pain 07/02/2022   Monoclonal B-cell lymphocytosis 01/15/2022   Weight loss  01/15/2022   Syncope 11/22/2021   Bilateral carotid artery stenosis 09/23/2021   Nocturia 08/18/2021   IDA (iron deficiency anemia) 07/24/2021   Thrombocytopenia (HCC) 05/03/2021   Normocytic anemia 04/03/2021   Unstable ankle 09/12/2020   Back pain 09/12/2020   Dupuytren contracture 03/03/2019   Healthcare maintenance 02/03/2017   Pseudophakia, both eyes 07/17/2016   Combined form of senile cataract of both eyes 05/02/2016   Hearing loss 10/20/2014   Obesity (BMI 30-39.9) 10/16/2013   Advance care planning 10/16/2013   Actinic keratosis 10/16/2013   Personal history of colonic adenomas 06/05/2012   Pancytopenia, acquired (HCC) 05/01/2011   Medicare annual wellness visit, subsequent 10/24/2010   FOOT PAIN, RIGHT 01/08/2010   Diabetic retinopathy (HCC) 03/21/2005   Essential hypertension 11/20/1999   GERD 01/19/1989   DM type 2 with diabetic background retinopathy (HCC) 04/21/1988   HYPERCHOLESTEROLEMIA  ([id number]) 1990 04/21/1988    ONSET DATE: 07/10/22  REFERRING DIAG: V25.36 (ICD-10-CM) - History of CVA in adulthood   THERAPY DIAG:  Abnormality of gait and mobility  Difficulty in walking, not elsewhere classified  Muscle weakness (generalized)  Unsteadiness on feet  Rationale for Evaluation and Treatment: Rehabilitation  SUBJECTIVE:  SUBJECTIVE STATEMENT: Pt reports doing well today. Pt denies any recent falls/stumbles since prior session. Pt denies any updates to medications or medical appointment since prior session. Pt reports good compliance with HEP when time permits.    Pt accompanied by: significant other wife : Dennis [facility]   PERTINENT HISTORY:   Per ED visit 09/05/22:   Dennis Wise is a 72 y.o. male with medical history significant of IIDM, HTN, HLD, MGUS, chronic  thrombocytopenia, sent from PCPs office for stroke workup.  Patient symptoms started March 21st after he sustained a mechanical fall at his stop when he hit his left shoulder and left rib cage.  No fracture or dislocation.  Since then the patient has had episodes of clumsiness and unsteady gait and had another 3 episodes of fall at home at different times.  Family also reported that the patient appeared to have memory issue after the initial event in March, as patient started to decreased short memory and complained about worsening of his visions.  Denies any numbness weakness of any of the limbs, no choking or cough or swallowing problems.  Today patient went to see PCP who ordered a outpatient MRI which turned positive for acute right PCA stroke and PCP asked patient to come to ED right away.  Family also reported poorly controlled blood pressure and glucose at home recently.  Pt stroke effected his vision and he has difficulty with peripheral vision and depth perception on the left.  From PT eval:  Pt suffered  RC tear on the left side on 07/13/22 and he is being treated for this via workman's compensation. Has been confirmed with MRI. Pt had stroke on 09/05/22 and went to [facility] ER and was in [place] for 2 days. Pt ambulates with SPC on the R currently. 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 Pt reports his job is more tedious than strenuous. Pt wife reports difficulty with walking in diagonal patterns.   PAIN:  Are you having pain? Yes: NPRS scale: 0/10 Pain location: Left shoulder - being treated  Aggravating factors: rolling over in bed, over use of shoulder  Relieving factors: rest  PRECAUTIONS: Fall  WEIGHT BEARING RESTRICTIONS: No  FALLS: Has patient fallen  in last 6 months? Yes. 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   LIVING ENVIRONMENT: Lives with: lives with their family and lives with their spouse Lives in: House/apartment Stairs: Yes: Internal: steps to basement but does not need to use  steps; on right going up and External: 2 steps; on left going up Has following equipment at home: Single point cane, Quad cane large base, Walker - 2 wheeled, shower chair, and Grab bars  PLOF: Independent  PATIENT GOALS: go back to work, see pertinent history and subjective   OBJECTIVE: (objective measures completed at initial evaluation unless otherwise dated)  DIAGNOSTIC FINDINGS:   IMPRESSION: 1. Acute right PCA distribution infarct. 2. Brain atrophy asymmetrically affecting the anterior right temporal lobe and correlating with the history of memory loss.  COGNITION: Overall cognitive status: Within functional limits for tasks assessed  COORDINATION: Not assessed, assess UE coordination at future session    POSTURE: rounded shoulders  LOWER EXTREMITY ROM:      (Blank rows = not tested)  LOWER EXTREMITY MMT:    MMT  Right Eval Left Eval  Hip flexion 4+ 4  Hip extension    Hip abduction 4+ 4+  Hip adduction 5 5  Hip internal rotation    Hip external rotation    Knee flexion 4+ 4  Knee extension 5 4+  Ankle dorsiflexion 4+ 4+  Ankle plantarflexion    Ankle inversion    Ankle eversion    (Blank rows = not tested)  BED MOBILITY:  WNL per report   TRANSFERS: Assistive device utilized: Single point cane  Sit to stand: Modified independence Stand to sit: Modified independence Chair to chair: Modified independence Floor:  not assessed   RAMP:  Not assessed   CURB:  Not assessed   STAIRS: Level of Assistance: CGA Stair Negotiation Technique: Step to Pattern with  Bilateral Rails Number of Stairs: 4  Height of Stairs: 6 in  Comments: on first step pt foot not properly on step, no LOB  GAIT: Gait pattern: decreased step length- Right, decreased stance time- Right, decreased stride length, and decreased hip/knee flexion- Left Distance walked: 30 ft Assistive device utilized: Single point cane Level of assistance: Modified independence Comments: ambulates with SPC normally but completes DGI with cane.   FUNCTIONAL TESTS:  5 times sit to stand: 14.98 sec 6 minute walk test: test visit 2 10 meter walk test: test visit 2  Dynamic Gait Index: 11     PATIENT SURVEYS:  FOTO 62 risk adjusted goal of 77  TODAY'S TREATMENT:                                                                                                                               Exercise/Activity Sets/Reps/Time/ Resistance Assistance Charge type Comments- Unless otherwise stated, CGA was provided and gait belt donned in order to ensure pt safety   Blaze pod random- 3 stations of fine motor based activities with attention focus on the left side   CGA NMR Pods in 3 rooms, challenged to complete faster but with accuracy each round, also challenged to become familiar with environment with fading frequency of cues for where various rooms are.  Big pegs in 10x 10 pegboard with premade pattern to follow, clips moving form left to right visual field with STS to chair at table, saebo reaching and adjustment on airex  , significant time spent at peg board station   Standing on airex pad with moving letters from left sid eof white board to the right to specific areas   CGA NMR Difficulty with attention to all pattern on the left side and frequent imbalance noted on airex. Pt frequently grabs bar despite cues for not reaching to this side, had pt put R UE across chest in " time out" due to  frequency of grabbing bar, likely due to dual task and pt forgetting not to hold on.                  Rationale for Evaluation and Treatment Rehabilitation  Pt educated throughout session about proper posture and technique with exercises. Improved exercise technique, movement at target joints, use of target muscles after min to mod verbal, visual, tactile cues.  Note: Portions of this document were prepared using Dragon voice recognition software and although reviewed may contain unintentional dictation errors in syntax, grammar, or spelling.      PATIENT EDUCATION: Education details: POC Person educated: Patient Education method: Explanation Education comprehension: verbalized understanding  HOME EXERCISE PROGRAM: Access Code: [email or web address] URL: [email or web address] Date: 09/19/2022 Prepared by: Thresa Ross  Exercises - Standing March with Counter Support  - 1 x daily - 7 x weekly - 2 sets - 25 reps - Side Stepping with Counter Support  - 1 x daily - 7 x weekly - 2 sets - 12 reps - standing in split stance with vertical head nods   - 1 x daily - 7 x weekly - 2 sets - 15 reps - Walking with Head Rotation  - 1 x daily - 7 x weekly  GOALS: Goals reviewed with patient? Yes  SHORT TERM GOALS: Target date: 10/16/2022        Patient will be independent in home exercise program to improve strength/mobility for better functional independence with ADLs. Baseline: No HEP currently  Goal status: INITIAL     LONG TERM GOALS: Target date: 12/11/2022   1.  Patient will increase FOTO score to equal to or greater than  73   to demonstrate statistically significant improvement in mobility and quality of life.  Baseline: 62 Goal status: INITIAL   2.  Patient will increase Dynamic gait index Balance score by > 8 points to demonstrate decreased fall risk during functional activities. Baseline: 11 Goal status: INITIAL    3.   Patient will increase 10 meter walk test to >1.34m/s as to improve gait speed for better community ambulation and to reduce fall  risk. Baseline: test visit 2  Goal status: INITIAL  4.   Patient will increase six minute walk test distance to >1000 for progression to community ambulator and improve gait ability Baseline: test visit 2  Goal status: INITIAL     ASSESSMENT:  CLINICAL IMPRESSION:  Patient presents to physical therapy for treatment session. Pt shows good progress with attention to his left side of visual field with white board dual task activity but requires frequent UE assist to prevent LOB. Pt has high challenge with attention to left side of visual field when performing fine motor planning activities like following a pattern with pegboard. Despite verbal, visual and hints patient was unable to create simple pattern and made frequent errors. 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 Will continue to incorporate static and dynamic balance to his dual tasking left sided attention in future sessions.  Pt will continue to benefit from skilled physical therapy intervention to address impairments, improve QOL, and attain therapy goals.    OBJECTIVE IMPAIRMENTS: Abnormal gait, decreased activity tolerance, decreased balance, decreased endurance, decreased mobility, difficulty walking, and decreased strength.   ACTIVITY LIMITATIONS: carrying, lifting, standing, stairs, and locomotion level  PARTICIPATION LIMITATIONS: driving, shopping, community activity, occupation, and yard work  PERSONAL FACTORS: Age,  Fitness, and 3+ comorbidities: HTN, DM, farmer's lung, HLD  are also affecting patient's functional outcome.   REHAB POTENTIAL: Good  CLINICAL DECISION MAKING: Evolving/moderate complexity  EVALUATION COMPLEXITY: Moderate  PLAN:  PT FREQUENCY: 2x/week  PT DURATION: 12 weeks  PLANNED INTERVENTIONS: Therapeutic exercises, Therapeutic activity, Neuromuscular re-education, Balance  training, Gait training, Patient/Family education, Self Care, Joint mobilization, Stair training, and Manual therapy  PLAN FOR NEXT SESSION: ensure pt has cognitive ability to plug things in in high stress environment, obstacle course navigating around objects ( more for vision than for balance so turns and tight space navigation) side stepping to left over various obstacles/ on obstacles but making sure to familiarize patient with obstacles prior so he can count and accommodate for visual deficits.    Norman Herrlich PT ,DPT Physical Therapist- [place]  [facility]

## 2022-10-10 ENCOUNTER — Telehealth: Payer: Self-pay | Admitting: Family Medicine

## 2022-10-10 NOTE — Telephone Encounter (Signed)
Patient returned call regarding his lab results.I relayed Dr Para March message to him ,he okayed and understood everything. I scheduled him for his repeat labs in 2 weeks

## 2022-10-10 NOTE — Telephone Encounter (Signed)
See result note for further documentation.

## 2022-10-13 ENCOUNTER — Ambulatory Visit: Payer: Medicare HMO

## 2022-10-13 DIAGNOSIS — R2689 Other abnormalities of gait and mobility: Secondary | ICD-10-CM

## 2022-10-13 DIAGNOSIS — R269 Unspecified abnormalities of gait and mobility: Secondary | ICD-10-CM | POA: Diagnosis not present

## 2022-10-13 DIAGNOSIS — R262 Difficulty in walking, not elsewhere classified: Secondary | ICD-10-CM | POA: Diagnosis not present

## 2022-10-13 DIAGNOSIS — M6281 Muscle weakness (generalized): Secondary | ICD-10-CM

## 2022-10-13 DIAGNOSIS — R2681 Unsteadiness on feet: Secondary | ICD-10-CM | POA: Diagnosis not present

## 2022-10-13 NOTE — Therapy (Signed)
OUTPATIENT PHYSICAL THERAPY NEURO TREATMENT   Patient Name: Dennis Wise MRN: [id number] DOB:1950-05-24, 72 y.o., male Today's Date: 10/13/2022  PCP: [name], MD  REFERRING PROVIDER: [name], MD   END OF SESSION:  PT End of Session - 10/13/22 0935     Visit Number 9    Number of Visits 24    Date for PT Re-Evaluation 12/11/22    Progress Note Due on Visit 10    PT Start Time 0934    PT Stop Time 1015    PT Time Calculation (min) 41 min    Equipment Utilized During Treatment Gait belt    Activity Tolerance Patient tolerated treatment well    Behavior During Therapy Flat affect;WFL for tasks assessed/performed               Past Medical History:  Diagnosis Date   Anemia    Iron deficiency part of this   Cataract    bilateral lens implants   Diabetes mellitus    type II   [name] (HCC)    GERD (gastroesophageal reflux disease) 01/1989   Helicobacter pylori gastritis 06/11/2011   On EGD 05/2011    Hyperlipidemia    Hypertension 11/1999   Internal hemorrhoids    Iron deficiency anemia, unspecified 03/04/2011   MGUS (monoclonal gammopathy of unknown significance)    possible dx initially, had negative f/u   Pancytopenia, acquired (HCC)    Personal history of colonic adenomas 06/05/2012   Pneumonia    Stroke [facility])    Past Surgical History:  Procedure Laterality Date   CATARACT EXTRACTION Bilateral    COLONOSCOPY     ESOPHAGOGASTRODUODENOSCOPY     2012 H. pylori gastritis   FLEXIBLE SIGMOIDOSCOPY  05/1988   internal hemorrhoids   KNEE SURGERY  12/2002   lt knee fx repair ORIF   TIBIA FRACTURE SURGERY     left 2012   Patient Active Problem List   Diagnosis Date Noted   History of CVA in adulthood 10/01/2022   Anxiety 10/01/2022   Acute ischemic right PCA stroke (HCC) 09/05/2022   Stroke (HCC) 09/05/2022   Memory change 09/03/2022   Shoulder pain 07/02/2022   Monoclonal B-cell lymphocytosis 01/15/2022   Weight loss 01/15/2022    Syncope 11/22/2021   Bilateral carotid artery stenosis 09/23/2021   Nocturia 08/18/2021   IDA (iron deficiency anemia) 07/24/2021   Thrombocytopenia (HCC) 05/03/2021   Normocytic anemia 04/03/2021   Unstable ankle 09/12/2020   Back pain 09/12/2020   Dupuytren contracture 03/03/2019   Healthcare maintenance 02/03/2017   Pseudophakia, both eyes 07/17/2016   Combined form of senile cataract of both eyes 05/02/2016   Hearing loss 10/20/2014   Obesity (BMI 30-39.9) 10/16/2013   Advance care planning 10/16/2013   Actinic keratosis 10/16/2013   Personal history of colonic adenomas 06/05/2012   Pancytopenia, acquired (HCC) 05/01/2011   Medicare annual wellness visit, subsequent 10/24/2010   FOOT PAIN, RIGHT 01/08/2010   Diabetic retinopathy (HCC) 03/21/2005   Essential hypertension 11/20/1999   GERD 01/19/1989   DM type 2 with diabetic background retinopathy (HCC) 04/21/1988   HYPERCHOLESTEROLEMIA  ([id number]) 1990 04/21/1988    ONSET DATE: 07/10/22  REFERRING DIAG: W09.81 (ICD-10-CM) - History of CVA in adulthood   THERAPY DIAG:  Abnormality of gait and mobility  Difficulty in walking, not elsewhere classified  Muscle weakness (generalized)  Unsteadiness on feet  Other abnormalities of gait and mobility  Rationale for Evaluation and Treatment: Rehabilitation    SUBJECTIVE:  SUBJECTIVE STATEMENT:  Pt reports he had a good weekend doing a "whole lot of nothing".   Pt accompanied by: significant other wife : [name]   PERTINENT HISTORY:   Per ED visit 09/05/22:   RAFORD BRISSETT is a 72 y.o. male with medical history significant of IIDM, HTN, HLD, MGUS, chronic thrombocytopenia, sent from PCPs office for stroke workup.  Patient symptoms started March 21st after he sustained a mechanical fall at his stop when he hit his left shoulder and left rib cage.  No fracture or dislocation.  Since then the patient has had episodes of clumsiness and unsteady gait and had another 3  episodes of fall at home at different times.  Family also reported that the patient appeared to have memory issue after the initial event in March, as patient started to decreased short memory and complained about worsening of his visions.  Denies any numbness weakness of any of the limbs, no choking or cough or swallowing problems.  Today patient went to see PCP who ordered a outpatient MRI which turned positive for acute right PCA stroke and PCP asked patient to come to ED right away.  Family also reported poorly controlled blood pressure and glucose at home recently.  Pt stroke effected his vision and he has difficulty with peripheral vision and depth perception on the left.  From PT eval:  Pt suffered  RC tear on the left side on 07/13/22 and he is being treated for this via workman's compensation. Has been confirmed with MRI. Pt had stroke on 09/05/22 and went to [facility] ER and was in hospital for 2 days. Pt ambulates with SPC on the R currently. 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 Pt reports his job is more tedious than strenuous. Pt wife reports difficulty with walking in diagonal patterns.   PAIN:  Are you having pain? Yes: NPRS scale: 0/10 Pain location: Left shoulder - being treated  Aggravating factors: rolling over in bed, over use of shoulder  Relieving factors: rest  PRECAUTIONS: Fall  WEIGHT BEARING RESTRICTIONS: No  FALLS: Has patient fallen in last 6 months? Yes. 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   LIVING ENVIRONMENT: Lives with: lives with their family and lives with their spouse Lives in:  House/apartment Stairs: Yes: Internal: steps to basement but does not need to use  steps; on right going up and External: 2 steps; on left going up Has following equipment at home: Single point cane, Quad cane large base, Walker - 2 wheeled, shower chair, and Grab bars  PLOF: Independent  PATIENT GOALS: go back to work, see pertinent history and subjective   OBJECTIVE: (objective measures completed at initial evaluation unless otherwise dated)  DIAGNOSTIC FINDINGS:   IMPRESSION: 1. Acute right PCA distribution infarct. 2. Brain atrophy asymmetrically affecting the anterior right temporal lobe and correlating with the history of memory loss.  COGNITION: Overall cognitive status: Within functional limits for tasks assessed     COORDINATION: Not assessed, assess UE coordination at future session    POSTURE: rounded shoulders  LOWER EXTREMITY ROM:      (  Blank rows = not tested)  LOWER EXTREMITY MMT:    MMT  Right Eval Left Eval  Hip flexion 4+ 4  Hip extension    Hip abduction 4+ 4+  Hip adduction 5 5  Hip internal rotation    Hip external rotation    Knee flexion 4+ 4  Knee extension 5 4+  Ankle dorsiflexion 4+ 4+  Ankle plantarflexion    Ankle inversion    Ankle eversion    (Blank rows = not tested)  BED MOBILITY:  WNL per report   TRANSFERS: Assistive device utilized: Single point cane  Sit to stand: Modified independence Stand to sit: Modified independence Chair to chair: Modified independence Floor:  not assessed   RAMP:  Not assessed   CURB:  Not assessed   STAIRS: Level of Assistance: CGA Stair Negotiation Technique: Step to Pattern with Bilateral Rails Number of Stairs: 4  Height of Stairs: 6 in  Comments: on first step pt foot not properly on step, no LOB  GAIT: Gait pattern: decreased step length- Right, decreased stance time- Right, decreased stride length, and decreased hip/knee flexion- Left Distance walked: 30 ft Assistive device  utilized: Single point cane Level of assistance: Modified independence Comments: ambulates with SPC normally but completes DGI with cane.   FUNCTIONAL TESTS:  5 times sit to stand: 14.98 sec 6 minute walk test: test visit 2 10 meter walk test: test visit 2  Dynamic Gait Index: 11     PATIENT SURVEYS:  FOTO 62 risk adjusted goal of 77  TODAY'S TREATMENT:                                                                                                                               Exercise/Activity Sets/Reps/Time/ Resistance Assistance Charge type Comments- Unless otherwise stated, CGA was provided and gait belt donned in order to ensure pt safety   Peg Board activity standing on airex pad  CGA NMR Big pegs in 10x 10 pegboard with premade pattern to follow, pt with increased difficulty and therapist required pt to scan picture and utilize counting method to follow the pattern.    Standing on airex pad with moving letters from left side of white board to the right to specific areas   CGA NMR Pt performed much better with the use of the R UE, but would attempt to "prop" up onto the bar with belly, requiring verbal cues to prevent it.  Pt propped up worse as time elapsed due to fatigue in the LE's.                  Rationale for Evaluation and Treatment Rehabilitation  Pt educated throughout session about proper posture and technique with exercises. Improved exercise technique, movement at target joints, use of target muscles after min to mod verbal, visual, tactile cues.     PATIENT EDUCATION: Education details: POC Person educated: Patient Education method: Explanation Education comprehension: verbalized understanding  HOME EXERCISE PROGRAM: Access Code: [email or web address] URL: [email or web address] Date: 09/19/2022 Prepared by: Thresa Ross  Exercises - Standing March with Counter Support  - 1 x daily - 7 x weekly - 2 sets - 25 reps - Side Stepping with Counter  Support  - 1 x daily - 7 x weekly - 2 sets - 12 reps - standing in split stance with vertical head nods   - 1 x daily - 7 x weekly - 2 sets - 15 reps - Walking with Head Rotation  - 1 x daily - 7 x weekly  GOALS: Goals reviewed with patient? Yes  SHORT TERM GOALS: Target date: 10/16/2022  Patient will be independent in home exercise program to improve strength/mobility for better functional independence with ADLs. Baseline: No HEP currently  Goal status: INITIAL    LONG TERM GOALS: Target date: 12/11/2022   1.  Patient will increase FOTO score to equal to or greater than  73   to demonstrate statistically significant improvement in mobility and quality of life.  Baseline: 62 Goal status: INITIAL   2.  Patient will increase Dynamic gait index Balance score by > 8 points to demonstrate decreased fall risk during functional activities. Baseline: 11 Goal status: INITIAL    3.  Patient will increase 10 meter walk test to >1.53m/s as to improve gait speed for better community ambulation and to reduce fall risk. Baseline: test visit 2  Goal status: INITIAL  4.  Patient will increase six minute walk test distance to >1000 for progression to community ambulator and improve gait ability Baseline: test visit 2  Goal status: INITIAL     ASSESSMENT:  CLINICAL IMPRESSION:   Pt performed well with consistent verbal cuing for reduced support in UE's while performing tasks.  Pt spent significant time performing the peg pattern challenge while standing on airex pad, which is where majority of session was spent.  Pt continues to have difficulty performing scanning of the board and requires increased time to perform the tasks.  Pt eventually able to perform counting method which allowed for the pt to scan the preset pattern and then place the pegs on the board.  Pt encouraged to continue with HEP at home in order to improve balance related tasks going forward.   Pt will continue to benefit from  skilled therapy to address remaining deficits in order to improve overall QoL and return to PLOF.       OBJECTIVE IMPAIRMENTS: Abnormal gait, decreased activity tolerance, decreased balance, decreased endurance, decreased mobility, difficulty walking, and decreased strength.   ACTIVITY LIMITATIONS: carrying, lifting, standing, stairs, and locomotion level  PARTICIPATION LIMITATIONS: driving, shopping, community activity, occupation, and yard work  PERSONAL FACTORS: Age, Fitness, and 3+ comorbidities: HTN, DM, farmer's lung, HLD  are also affecting patient's functional outcome.   REHAB POTENTIAL: Good  CLINICAL DECISION MAKING: Evolving/moderate complexity  EVALUATION COMPLEXITY: Moderate  PLAN:  PT FREQUENCY: 2x/week  PT DURATION: 12 weeks  PLANNED INTERVENTIONS: Therapeutic exercises, Therapeutic activity, Neuromuscular re-education, Balance training, Gait training, Patient/Family education, Self Care, Joint mobilization, Stair training, and Manual therapy  PLAN FOR NEXT SESSION: ensure pt has cognitive ability to plug things in in high stress environment, obstacle course navigating around objects ( more for vision than for balance so turns and tight space navigation) side stepping to left over various obstacles/ on obstacles but making sure to familiarize patient with obstacles prior so he can count and accommodate for visual deficits.    Nolon Bussing, PT, DPT  Physical Therapist - [facility]  10/13/22, 9:39 AM

## 2022-10-15 ENCOUNTER — Ambulatory Visit: Payer: Medicare HMO

## 2022-10-15 DIAGNOSIS — M6281 Muscle weakness (generalized): Secondary | ICD-10-CM | POA: Diagnosis not present

## 2022-10-15 DIAGNOSIS — R269 Unspecified abnormalities of gait and mobility: Secondary | ICD-10-CM

## 2022-10-15 DIAGNOSIS — R262 Difficulty in walking, not elsewhere classified: Secondary | ICD-10-CM | POA: Diagnosis not present

## 2022-10-15 DIAGNOSIS — R2689 Other abnormalities of gait and mobility: Secondary | ICD-10-CM

## 2022-10-15 DIAGNOSIS — R2681 Unsteadiness on feet: Secondary | ICD-10-CM | POA: Diagnosis not present

## 2022-10-15 NOTE — Therapy (Signed)
OUTPATIENT PHYSICAL THERAPY NEURO TREATMENT   Patient Name: Dennis Wise MRN: [id number] DOB:Sep 01, 1950, 72 y.o., male Today's Date: 10/15/2022  PCP: [name], MD  REFERRING PROVIDER: [name], MD   END OF SESSION:  PT End of Session - 10/15/22 1347     Visit Number 10    Number of Visits 24    Date for PT Re-Evaluation 12/11/22    Progress Note Due on Visit 10    PT Start Time 1347    PT Stop Time 1430    PT Time Calculation (min) 43 min    Equipment Utilized During Treatment Gait belt    Activity Tolerance Patient tolerated treatment well    Behavior During Therapy Flat affect;WFL for tasks assessed/performed               Past Medical History:  Diagnosis Date   Anemia    Iron deficiency part of this   Cataract    bilateral lens implants   Diabetes mellitus    type II   [name] (HCC)    GERD (gastroesophageal reflux disease) 01/1989   Helicobacter pylori gastritis 06/11/2011   On EGD 05/2011    Hyperlipidemia    Hypertension 11/1999   Internal hemorrhoids    Iron deficiency anemia, unspecified 03/04/2011   MGUS (monoclonal gammopathy of unknown significance)    possible dx initially, had negative f/u   Pancytopenia, acquired (HCC)    Personal history of colonic adenomas 06/05/2012   Pneumonia    Stroke [facility])    Past Surgical History:  Procedure Laterality Date   CATARACT EXTRACTION Bilateral    COLONOSCOPY     ESOPHAGOGASTRODUODENOSCOPY     2012 H. pylori gastritis   FLEXIBLE SIGMOIDOSCOPY  05/1988   internal hemorrhoids   KNEE SURGERY  12/2002   lt knee fx repair ORIF   TIBIA FRACTURE SURGERY     left 2012   Patient Active Problem List   Diagnosis Date Noted   History of CVA in adulthood 10/01/2022   Anxiety 10/01/2022   Acute ischemic right PCA stroke (HCC) 09/05/2022   Stroke (HCC) 09/05/2022   Memory change 09/03/2022   Shoulder pain 07/02/2022   Monoclonal B-cell lymphocytosis 01/15/2022   Weight loss  01/15/2022   Syncope 11/22/2021   Bilateral carotid artery stenosis 09/23/2021   Nocturia 08/18/2021   IDA (iron deficiency anemia) 07/24/2021   Thrombocytopenia (HCC) 05/03/2021   Normocytic anemia 04/03/2021   Unstable ankle 09/12/2020   Back pain 09/12/2020   Dupuytren contracture 03/03/2019   Healthcare maintenance 02/03/2017   Pseudophakia, both eyes 07/17/2016   Combined form of senile cataract of both eyes 05/02/2016   Hearing loss 10/20/2014   Obesity (BMI 30-39.9) 10/16/2013   Advance care planning 10/16/2013   Actinic keratosis 10/16/2013   Personal history of colonic adenomas 06/05/2012   Pancytopenia, acquired (HCC) 05/01/2011   Medicare annual wellness visit, subsequent 10/24/2010   FOOT PAIN, RIGHT 01/08/2010   Diabetic retinopathy (HCC) 03/21/2005   Essential hypertension 11/20/1999   GERD 01/19/1989   DM type 2 with diabetic background retinopathy (HCC) 04/21/1988   HYPERCHOLESTEROLEMIA  ([id number]) 1990 04/21/1988    ONSET DATE: 07/10/22  REFERRING DIAG: G29.52 (ICD-10-CM) - History of CVA in adulthood   THERAPY DIAG:  Abnormality of gait and mobility  Difficulty in walking, not elsewhere classified  Muscle weakness (generalized)  Unsteadiness on feet  Other abnormalities of gait and mobility  Rationale for Evaluation and Treatment: Rehabilitation    SUBJECTIVE:  SUBJECTIVE STATEMENT:  Pt reports he is ready for goal assessment today.  Pt states his goal is to get back to driving and work.   Pt accompanied by: significant other wife : Dennis Wise   PERTINENT HISTORY:   Per ED visit 09/05/22:   Dennis GILLIE is a 72 y.o. male with medical history significant of IIDM, HTN, HLD, MGUS, chronic thrombocytopenia, sent from PCPs office for stroke workup.  Patient symptoms started March 21st after he sustained a mechanical fall at his stop when he hit his left shoulder and left rib cage.  No fracture or dislocation.  Since then the patient has had  episodes of clumsiness and unsteady gait and had another 3 episodes of fall at home at different times.  Family also reported that the patient appeared to have memory issue after the initial event in March, as patient started to decreased short memory and complained about worsening of his visions.  Denies any numbness weakness of any of the limbs, no choking or cough or swallowing problems.  Today patient went to see PCP who ordered a outpatient MRI which turned positive for acute right PCA stroke and PCP asked patient to come to ED right away.  Family also reported poorly controlled blood pressure and glucose at home recently.  Pt stroke effected his vision and he has difficulty with peripheral vision and depth perception on the left.  From PT eval:  Pt suffered  RC tear on the left side on 07/13/22 and he is being treated for this via workman's compensation. Has been confirmed with MRI. Pt had stroke on 09/05/22 and went to [facility] and was in hospital for 2 days. Pt ambulates with SPC on the R currently. 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 Pt reports his job is more tedious than strenuous. Pt wife reports difficulty with walking in diagonal patterns.   PAIN:  Are you having pain? Yes: NPRS scale: 0/10 Pain location: Left shoulder - being treated  Aggravating factors: rolling over in bed, over use of shoulder  Relieving factors: rest  PRECAUTIONS: Fall  WEIGHT BEARING RESTRICTIONS: No  FALLS: Has patient fallen in last 6 months? Yes. 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   LIVING ENVIRONMENT: Lives with: lives with  their family and lives with their spouse Lives in: House/apartment Stairs: Yes: Internal: steps to basement but does not need to use  steps; on right going up and External: 2 steps; on left going up Has following equipment at home: Single point cane, Quad cane large base, Walker - 2 wheeled, shower chair, and Grab bars  PLOF: Independent  PATIENT GOALS: go back to work, see pertinent history and subjective   OBJECTIVE: (objective measures completed at initial evaluation unless otherwise dated)  DIAGNOSTIC FINDINGS:   IMPRESSION: 1. Acute right PCA distribution infarct. 2. Brain atrophy asymmetrically affecting the anterior right temporal lobe and correlating with the history of memory loss.  COGNITION: Overall cognitive status: Within functional limits for tasks assessed     COORDINATION: Not assessed, assess UE coordination at future session  POSTURE: rounded shoulders  LOWER EXTREMITY ROM:      (Blank rows = not tested)  LOWER EXTREMITY MMT:    MMT  Right Eval Left Eval  Hip flexion 4+ 4  Hip extension    Hip abduction 4+ 4+  Hip adduction 5 5  Hip internal rotation    Hip external rotation    Knee flexion 4+ 4  Knee extension 5 4+  Ankle dorsiflexion 4+ 4+  Ankle plantarflexion    Ankle inversion    Ankle eversion    (Blank rows = not tested)  BED MOBILITY:  WNL per report   TRANSFERS: Assistive device utilized: Single point cane  Sit to stand: Modified independence Stand to sit: Modified independence Chair to chair: Modified independence Floor:  not assessed   RAMP:  Not assessed   CURB:  Not assessed   STAIRS: Level of Assistance: CGA Stair Negotiation Technique: Step to Pattern with Bilateral Rails Number of Stairs: 4  Height of Stairs: 6 in  Comments: on first step pt foot not properly on step, no LOB  GAIT: Gait pattern: decreased step length- Right, decreased stance time- Right, decreased stride length, and decreased hip/knee  flexion- Left Distance walked: 30 ft Assistive device utilized: Single point cane Level of assistance: Modified independence Comments: ambulates with SPC normally but completes DGI with cane.   FUNCTIONAL TESTS:  5 times sit to stand: 14.98 sec 6 minute walk test: 865 ft 10 meter walk test: 11.85 sec Dynamic Gait Index: 11     PATIENT SURVEYS:  FOTO 62 risk adjusted goal of 77  TODAY'S TREATMENT:                                                                                                                                TherAct:  Goals assessed and noted below:     Rationale for Evaluation and Treatment Rehabilitation  Pt educated throughout session about proper posture and technique with exercises. Improved exercise technique, movement at target joints, use of target muscles after min to mod verbal, visual, tactile cues.     PATIENT EDUCATION: Education details: POC Person educated: Patient Education method: Explanation Education comprehension: verbalized understanding  HOME EXERCISE PROGRAM: Access Code: [email or web address] URL: [email or web address] Date: 09/19/2022 Prepared by: Thresa Ross  Exercises - Standing March with Counter Support  - 1 x daily - 7 x weekly - 2 sets - 25 reps - Side Stepping with Counter Support  - 1 x daily - 7 x weekly - 2 sets - 12 reps - standing in split stance with vertical head nods   - 1 x daily - 7 x weekly - 2 sets - 15 reps - Walking with Head Rotation  - 1 x daily - 7 x weekly  GOALS: Goals reviewed with patient? Yes  SHORT TERM GOALS: Target date: 10/16/2022  Patient will be independent in home exercise program to improve strength/mobility for better functional independence with ADLs.  Baseline: No HEP currently  Goal status: INITIAL    LONG TERM GOALS: Target date: 12/11/2022  1.  Patient will increase FOTO score to equal to or greater than  73   to demonstrate statistically significant improvement in  mobility and quality of life.  Baseline: 63 10/15/22: 72 Goal status: INITIAL   2.  Patient will increase Dynamic gait index Balance score by > 8 points to demonstrate decreased fall risk during functional activities. Baseline: 11 10/15/22: 10 Goal status: INITIAL   3.  Patient will increase 10 meter walk test to >1.10m/s as to improve gait speed for better community ambulation and to reduce fall risk. Baseline: 0.84 m/s 10/15/22: 0.81 m/s Goal status: INITIAL  4.  Patient will increase six minute walk test distance to >1000 for progression to community ambulator and improve gait ability Baseline: 865' 10/15/22: 881' Goal status: INITIAL     ASSESSMENT:  CLINICAL IMPRESSION:  Pt has improved in FOTO which takes into account subjective improvement, as well as improving ambulation distance during , however has regressed on all other goals at this point in therapy.  Pt still has difficulty with left sided vision and lacks stability during gait at times with head movements.  Patient's condition has the potential to improve in response to therapy. Maximum improvement is yet to be obtained. The anticipated improvement is attainable and reasonable in a generally predictable time.   Pt will continue to benefit from skilled therapy to address remaining deficits in order to improve overall QoL and return to PLOF.        OBJECTIVE IMPAIRMENTS: Abnormal gait, decreased activity tolerance, decreased balance, decreased endurance, decreased mobility, difficulty walking, and decreased strength.   ACTIVITY LIMITATIONS: carrying, lifting, standing, stairs, and locomotion level  PARTICIPATION LIMITATIONS: driving, shopping, community activity, occupation, and yard work  PERSONAL FACTORS: Age, Fitness, and 3+ comorbidities: HTN, DM, farmer's lung, HLD  are also affecting patient's functional outcome.   REHAB POTENTIAL: Good  CLINICAL DECISION MAKING: Evolving/moderate complexity  EVALUATION  COMPLEXITY: Moderate  PLAN:  PT FREQUENCY: 2x/week  PT DURATION: 12 weeks  PLANNED INTERVENTIONS: Therapeutic exercises, Therapeutic activity, Neuromuscular re-education, Balance training, Gait training, Patient/Family education, Self Care, Joint mobilization, Stair training, and Manual therapy  PLAN FOR NEXT SESSION:  ensure pt has cognitive ability to plug things in in high stress environment, obstacle course navigating around objects ( more for vision than for balance so turns and tight space navigation) side stepping to left over various obstacles/ on obstacles but making sure to familiarize patient with obstacles prior so he can count and accommodate for visual deficits.    Nolon Bussing, PT, DPT Physical Therapist - [facility]  10/15/22, 6:16 PM

## 2022-10-16 ENCOUNTER — Telehealth: Payer: Self-pay | Admitting: Neurology

## 2022-10-16 ENCOUNTER — Encounter: Payer: Self-pay | Admitting: Neurology

## 2022-10-16 DIAGNOSIS — I63531 Cerebral infarction due to unspecified occlusion or stenosis of right posterior cerebral artery: Secondary | ICD-10-CM

## 2022-10-16 NOTE — Telephone Encounter (Signed)
Referral faxed to [facility]: Phone [phone]  Fax: [phone]

## 2022-10-22 ENCOUNTER — Ambulatory Visit: Payer: Medicare HMO | Attending: Family Medicine

## 2022-10-22 DIAGNOSIS — R2681 Unsteadiness on feet: Secondary | ICD-10-CM | POA: Diagnosis not present

## 2022-10-22 DIAGNOSIS — R262 Difficulty in walking, not elsewhere classified: Secondary | ICD-10-CM | POA: Diagnosis not present

## 2022-10-22 DIAGNOSIS — M6281 Muscle weakness (generalized): Secondary | ICD-10-CM | POA: Diagnosis not present

## 2022-10-22 DIAGNOSIS — R269 Unspecified abnormalities of gait and mobility: Secondary | ICD-10-CM | POA: Diagnosis not present

## 2022-10-22 DIAGNOSIS — R2689 Other abnormalities of gait and mobility: Secondary | ICD-10-CM | POA: Diagnosis not present

## 2022-10-22 NOTE — Therapy (Signed)
OUTPATIENT PHYSICAL THERAPY NEURO TREATMENT   Patient Name: Dennis Wise MRN: [id number] DOB:[birth year], 72 y.o., male Today's Date: 10/22/2022  PCP: [name], MD  REFERRING PROVIDER: [name], MD   END OF SESSION:  PT End of Session - 10/22/22 0940     Visit Number 11    Number of Visits 24    Date for PT Re-Evaluation 12/11/22    Progress Note Due on Visit 20    PT Start Time 0934    PT Stop Time 1015    PT Time Calculation (min) 41 min    Equipment Utilized During Treatment Gait belt    Activity Tolerance Patient tolerated treatment well    Behavior During Therapy WFL for tasks assessed/performed               Past Medical History:  Diagnosis Date   Anemia    Iron deficiency part of this   Cataract    bilateral lens implants   Diabetes mellitus    type II   [name] (HCC)    GERD (gastroesophageal reflux disease) 01/1989   Helicobacter pylori gastritis 06/11/2011   On EGD 05/2011    Hyperlipidemia    Hypertension 11/1999   Internal hemorrhoids    Iron deficiency anemia, unspecified 03/04/2011   MGUS (monoclonal gammopathy of unknown significance)    possible dx initially, had negative f/u   Pancytopenia, acquired (HCC)    Personal history of colonic adenomas 06/05/2012   Pneumonia    Stroke [facility])    Past Surgical History:  Procedure Laterality Date   CATARACT EXTRACTION Bilateral    COLONOSCOPY     ESOPHAGOGASTRODUODENOSCOPY     2012 H. pylori gastritis   FLEXIBLE SIGMOIDOSCOPY  05/1988   internal hemorrhoids   KNEE SURGERY  12/2002   lt knee fx repair ORIF   TIBIA FRACTURE SURGERY     left 2012   Patient Active Problem List   Diagnosis Date Noted   History of CVA in adulthood 10/01/2022   Anxiety 10/01/2022   Acute ischemic right PCA stroke (HCC) 09/05/2022   Stroke (HCC) 09/05/2022   Memory change 09/03/2022   Shoulder pain 07/02/2022   Monoclonal B-cell lymphocytosis 01/15/2022   Weight loss 01/15/2022   Syncope  11/22/2021   Bilateral carotid artery stenosis 09/23/2021   Nocturia 08/18/2021   IDA (iron deficiency anemia) 07/24/2021   Thrombocytopenia (HCC) 05/03/2021   Normocytic anemia 04/03/2021   Unstable ankle 09/12/2020   Back pain 09/12/2020   Dupuytren contracture 03/03/2019   Healthcare maintenance 02/03/2017   Pseudophakia, both eyes 07/17/2016   Combined form of senile cataract of both eyes 05/02/2016   Hearing loss 10/20/2014   Obesity (BMI 30-39.9) 10/16/2013   Advance care planning 10/16/2013   Actinic keratosis 10/16/2013   Personal history of colonic adenomas 06/05/2012   Pancytopenia, acquired (HCC) 05/01/2011   Medicare annual wellness visit, subsequent 10/24/2010   FOOT PAIN, RIGHT 01/08/2010   Diabetic retinopathy (HCC) 03/21/2005   Essential hypertension 11/20/1999   GERD 01/19/1989   DM type 2 with diabetic background retinopathy (HCC) 04/21/1988   HYPERCHOLESTEROLEMIA  ([id number]) 1990 04/21/1988    ONSET DATE: 07/10/22  REFERRING DIAG: W09.81 (ICD-10-CM) - History of CVA in adulthood   THERAPY DIAG:  Abnormality of gait and mobility  Difficulty in walking, not elsewhere classified  Muscle weakness (generalized)  Unsteadiness on feet  Other abnormalities of gait and mobility  Rationale for Evaluation and Treatment: Rehabilitation    SUBJECTIVE:  SUBJECTIVE STATEMENT:  Pt reports no significant issues since last visit. Continues to report his goal of returning to driving and work.   Pt accompanied by: significant other wife : [name]   PERTINENT HISTORY:   Per ED visit 09/05/22:   Dennis Wise is a 71 y.o. male with medical history significant of IIDM, HTN, HLD, MGUS, chronic thrombocytopenia, sent from PCPs office for stroke workup.  Patient symptoms started March 21st after he sustained a mechanical fall at his stop when he hit his left shoulder and left rib cage.  No fracture or dislocation.  Since then the patient has had episodes of  clumsiness and unsteady gait and had another 3 episodes of fall at home at different times.  Family also reported that the patient appeared to have memory issue after the initial event in March, as patient started to decreased short memory and complained about worsening of his visions.  Denies any numbness weakness of any of the limbs, no choking or cough or swallowing problems.  Today patient went to see PCP who ordered a outpatient MRI which turned positive for acute right PCA stroke and PCP asked patient to come to ED right away.  Family also reported poorly controlled blood pressure and glucose at home recently.  Pt stroke effected his vision and he has difficulty with peripheral vision and depth perception on the left.  From PT eval:  Pt suffered  RC tear on the left side on 07/13/22 and he is being treated for this via workman's compensation. Has been confirmed with MRI. Pt had stroke on 09/05/22 and went to [facility] ER and was in hospital for 2 days. Pt ambulates with SPC on the R currently. 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 Pt reports his job is more tedious than strenuous. Pt wife reports difficulty with walking in diagonal patterns.   PAIN:  Are you having pain? Yes: NPRS scale: 0/10 Pain location: Left shoulder - being treated  Aggravating factors: rolling over in bed, over use of shoulder  Relieving factors: rest  PRECAUTIONS: Fall  WEIGHT BEARING RESTRICTIONS: No  FALLS: Has patient fallen in last 6 months? Yes. 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   LIVING ENVIRONMENT: Lives with: lives with their family  and lives with their spouse Lives in: House/apartment Stairs: Yes: Internal: steps to basement but does not need to use  steps; on right going up and External: 2 steps; on left going up Has following equipment at home: Single point cane, Quad cane large base, Walker - 2 wheeled, shower chair, and Grab bars  PLOF: Independent  PATIENT GOALS: go back to work, see pertinent history and subjective   OBJECTIVE: (objective measures completed at initial evaluation unless otherwise dated)  DIAGNOSTIC FINDINGS:   IMPRESSION: 1. Acute right PCA distribution infarct. 2. Brain atrophy asymmetrically affecting the anterior right temporal lobe and correlating with the history of memory loss.  COGNITION: Overall cognitive status: Within functional limits for tasks assessed     COORDINATION: Not assessed, assess UE coordination at future session    POSTURE:  rounded shoulders  LOWER EXTREMITY ROM:      (Blank rows = not tested)  LOWER EXTREMITY MMT:    MMT  Right Eval Left Eval  Hip flexion 4+ 4  Hip extension    Hip abduction 4+ 4+  Hip adduction 5 5  Hip internal rotation    Hip external rotation    Knee flexion 4+ 4  Knee extension 5 4+  Ankle dorsiflexion 4+ 4+  Ankle plantarflexion    Ankle inversion    Ankle eversion    (Blank rows = not tested)  BED MOBILITY:  WNL per report   TRANSFERS: Assistive device utilized: Single point cane  Sit to stand: Modified independence Stand to sit: Modified independence Chair to chair: Modified independence Floor:  not assessed   RAMP:  Not assessed   CURB:  Not assessed   STAIRS: Level of Assistance: CGA Stair Negotiation Technique: Step to Pattern with Bilateral Rails Number of Stairs: 4  Height of Stairs: 6 in  Comments: on first step pt foot not properly on step, no LOB  GAIT: Gait pattern: decreased step length- Right, decreased stance time- Right, decreased stride length, and decreased hip/knee flexion-  Left Distance walked: 30 ft Assistive device utilized: Single point cane Level of assistance: Modified independence Comments: ambulates with SPC normally but completes DGI with cane.   FUNCTIONAL TESTS:  5 times sit to stand: 14.98 sec 6 minute walk test: 865 ft 10 meter walk test: 11.85 sec Dynamic Gait Index: 11     PATIENT SURVEYS:  FOTO 62 risk adjusted goal of 77  TODAY'S TREATMENT:                                                                                                                                NMR: Performed at support bar today with CGA and use of gait belt for all below activities.   Static Standing on airex pad  with dual task involving with moving letters into called out Shapes (Ex: A into square, B into circle) x several min changing foot position from wide to narrow to staggered position x several min with 2 seated rest breaks. Patient with some difficulty with left side attention- requiring increased time to visual locate letter and move to corresponding shape.     Horizontal head turns x approx 80 feet x 4 trials, CGA with instruction to walk in middle of hallway and turn head left to right to call out items positioned on wall (sticky notes or pictures). Initially patient with poor to fair scanning to left side and some gait instability- drifting off center with gait. He did progress with VC and practice and improved from 25% accuracy to closer to 75 %.   BP at end of session = 147/53 mmHg Left UE in seated position.   Rationale for Evaluation and Treatment Rehabilitation  Pt educated throughout session about proper posture and technique with exercises. Improved exercise technique,  movement at target joints, use of target muscles after min to mod verbal, visual, tactile cues.     PATIENT EDUCATION: Education details: POC Person educated: Patient Education method: Explanation Education comprehension: verbalized understanding  HOME EXERCISE  PROGRAM: Access Code: [email or web address] URL: [email or web address] Date: 09/19/2022 Prepared by: Thresa Ross  Exercises - Standing March with Counter Support  - 1 x daily - 7 x weekly - 2 sets - 25 reps - Side Stepping with Counter Support  - 1 x daily - 7 x weekly - 2 sets - 12 reps - standing in split stance with vertical head nods   - 1 x daily - 7 x weekly - 2 sets - 15 reps - Walking with Head Rotation  - 1 x daily - 7 x weekly  GOALS: Goals reviewed with patient? Yes  SHORT TERM GOALS: Target date: 10/16/2022  Patient will be independent in home exercise program to improve strength/mobility for better functional independence with ADLs. Baseline: No HEP currently  Goal status: INITIAL    LONG TERM GOALS: Target date: 12/11/2022  1.  Patient will increase FOTO score to equal to or greater than  73   to demonstrate statistically significant improvement in mobility and quality of life.  Baseline: 63 10/15/22: 72 Goal status: INITIAL   2.  Patient will increase Dynamic gait index Balance score by > 8 points to demonstrate decreased fall risk during functional activities. Baseline: 11 10/15/22: 10 Goal status: INITIAL   3.  Patient will increase 10 meter walk test to >1.35m/s as to improve gait speed for better community ambulation and to reduce fall risk. Baseline: 0.84 m/s 10/15/22: 0.81 m/s Goal status: INITIAL  4.  Patient will increase six minute walk test distance to >1000 for progression to community ambulator and improve gait ability Baseline: 865' 10/15/22: 881' Goal status: INITIAL     ASSESSMENT:  CLINICAL IMPRESSION:  Patient presents today with good motivation for today's activities. Treatment focused on plan of care including visual activities due to some continued left side inattention. Patient perform some dual task activities requiring him to scan and use left side. He  still has difficulty with left sided vision  and some left side neglect with  horizontal head turning. He did demonstrate some improvement with practice and will continue to benefit from further training.  Pt will continue to benefit from skilled therapy to address remaining deficits in order to improve overall quality of life and return to PLOF.        OBJECTIVE IMPAIRMENTS: Abnormal gait, decreased activity tolerance, decreased balance, decreased endurance, decreased mobility, difficulty walking, and decreased strength.   ACTIVITY LIMITATIONS: carrying, lifting, standing, stairs, and locomotion level  PARTICIPATION LIMITATIONS: driving, shopping, community activity, occupation, and yard work  PERSONAL FACTORS: Age, Fitness, and 3+ comorbidities: HTN, DM, farmer's lung, HLD  are also affecting patient's functional outcome.   REHAB POTENTIAL: Good  CLINICAL DECISION MAKING: Evolving/moderate complexity  EVALUATION COMPLEXITY: Moderate  PLAN:  PT FREQUENCY: 2x/week  PT DURATION: 12 weeks  PLANNED INTERVENTIONS: Therapeutic exercises, Therapeutic activity, Neuromuscular re-education, Balance training, Gait training, Patient/Family education, Self Care, Joint mobilization, Stair training, and Manual therapy  PLAN FOR NEXT SESSION:  Ensure pt has cognitive ability to plug things in in high stress environment, obstacle course navigating around objects ( more for vision than for balance so turns and tight space navigation) side stepping to left over various obstacles/ on obstacles but making sure to familiarize patient with obstacles prior so he can count  and accommodate for visual deficits. Continue with blaze pod activity for visual scanning and attention to left side.    Louis Meckel, PT Physical Therapist - [facility]  10/22/22, 11:52 PM

## 2022-10-24 ENCOUNTER — Other Ambulatory Visit: Payer: Self-pay

## 2022-10-24 ENCOUNTER — Other Ambulatory Visit (INDEPENDENT_AMBULATORY_CARE_PROVIDER_SITE_OTHER): Payer: Medicare HMO

## 2022-10-24 ENCOUNTER — Other Ambulatory Visit: Payer: Self-pay | Admitting: Family Medicine

## 2022-10-24 DIAGNOSIS — E871 Hypo-osmolality and hyponatremia: Secondary | ICD-10-CM

## 2022-10-24 DIAGNOSIS — R748 Abnormal levels of other serum enzymes: Secondary | ICD-10-CM

## 2022-10-24 LAB — BASIC METABOLIC PANEL
BUN: 35 mg/dL — ABNORMAL HIGH (ref 6–23)
CO2: 28 mEq/L (ref 19–32)
Calcium: 9.2 mg/dL (ref 8.4–10.5)
Chloride: 99 mEq/L (ref 96–112)
Creatinine, Ser: 1.38 mg/dL (ref 0.40–1.50)
GFR: 50.45 mL/min — ABNORMAL LOW (ref >60.00)
Glucose, Bld: 140 mg/dL — ABNORMAL HIGH (ref 70–99)
Potassium: 4.5 mEq/L (ref 3.5–5.1)
Sodium: 135 mEq/L (ref 135–145)

## 2022-10-25 LAB — SODIUM, URINE, RANDOM: Sodium, Ur: 67 mmol/L (ref 28–272)

## 2022-10-27 ENCOUNTER — Ambulatory Visit: Payer: Medicare HMO | Admitting: Physical Therapy

## 2022-10-27 ENCOUNTER — Encounter: Payer: Self-pay | Admitting: Physical Therapy

## 2022-10-27 DIAGNOSIS — R262 Difficulty in walking, not elsewhere classified: Secondary | ICD-10-CM

## 2022-10-27 DIAGNOSIS — R2681 Unsteadiness on feet: Secondary | ICD-10-CM | POA: Diagnosis not present

## 2022-10-27 DIAGNOSIS — M6281 Muscle weakness (generalized): Secondary | ICD-10-CM

## 2022-10-27 DIAGNOSIS — R2689 Other abnormalities of gait and mobility: Secondary | ICD-10-CM | POA: Diagnosis not present

## 2022-10-27 DIAGNOSIS — R269 Unspecified abnormalities of gait and mobility: Secondary | ICD-10-CM

## 2022-10-27 NOTE — Therapy (Signed)
OUTPATIENT PHYSICAL THERAPY NEURO TREATMENT   Patient Name: Dennis Wise MRN: [id number] DOB:[birth year], 72 y.o., male Today's Date: 10/27/2022  PCP: Dennis Nam, MD  REFERRING PROVIDER: [name], MD   END OF SESSION:  PT End of Session - 10/27/22 1051     Visit Number 12    Number of Visits 24    Date for PT Re-Evaluation 12/11/22    Progress Note Due on Visit 20    PT Start Time 1100    PT Stop Time 1143    PT Time Calculation (min) 43 min    Equipment Utilized During Treatment Gait belt    Activity Tolerance Patient tolerated treatment well    Behavior During Therapy WFL for tasks assessed/performed                Past Medical History:  Diagnosis Date   Anemia    Iron deficiency part of this   Cataract    bilateral lens implants   Diabetes mellitus    type II   [name] (HCC)    GERD (gastroesophageal reflux disease) 01/1989   Helicobacter pylori gastritis 06/11/2011   On EGD 05/2011    Hyperlipidemia    Hypertension 11/1999   Internal hemorrhoids    Iron deficiency anemia, unspecified 03/04/2011   MGUS (monoclonal gammopathy of unknown significance)    possible dx initially, had negative f/u   Pancytopenia, acquired (HCC)    Personal history of colonic adenomas 06/05/2012   Pneumonia    Stroke [facility])    Past Surgical History:  Procedure Laterality Date   CATARACT EXTRACTION Bilateral    COLONOSCOPY     ESOPHAGOGASTRODUODENOSCOPY     2012 H. pylori gastritis   FLEXIBLE SIGMOIDOSCOPY  05/1988   internal hemorrhoids   KNEE SURGERY  12/2002   lt knee fx repair ORIF   TIBIA FRACTURE SURGERY     left 2012   Patient Active Problem List   Diagnosis Date Noted   History of CVA in adulthood 10/01/2022   Anxiety 10/01/2022   Acute ischemic right PCA stroke (HCC) 09/05/2022   Stroke (HCC) 09/05/2022   Memory change 09/03/2022   Shoulder pain 07/02/2022   Monoclonal B-cell lymphocytosis 01/15/2022   Weight loss 01/15/2022    Syncope 11/22/2021   Bilateral carotid artery stenosis 09/23/2021   Nocturia 08/18/2021   IDA (iron deficiency anemia) 07/24/2021   Thrombocytopenia (HCC) 05/03/2021   Normocytic anemia 04/03/2021   Unstable ankle 09/12/2020   Back pain 09/12/2020   Dupuytren contracture 03/03/2019   Healthcare maintenance 02/03/2017   Pseudophakia, both eyes 07/17/2016   Combined form of senile cataract of both eyes 05/02/2016   Hearing loss 10/20/2014   Obesity (BMI 30-39.9) 10/16/2013   Advance care planning 10/16/2013   Actinic keratosis 10/16/2013   Personal history of colonic adenomas 06/05/2012   Pancytopenia, acquired (HCC) 05/01/2011   Medicare annual wellness visit, subsequent 10/24/2010   FOOT PAIN, RIGHT 01/08/2010   Diabetic retinopathy (HCC) 03/21/2005   Essential hypertension 11/20/1999   GERD 01/19/1989   DM type 2 with diabetic background retinopathy (HCC) 04/21/1988   HYPERCHOLESTEROLEMIA  ([id number]) 1990 04/21/1988    ONSET DATE: 07/10/22  REFERRING DIAG: W09.81 (ICD-10-CM) - History of CVA in adulthood   THERAPY DIAG:  Abnormality of gait and mobility  Difficulty in walking, not elsewhere classified  Muscle weakness (generalized)  Unsteadiness on feet  Rationale for Evaluation and Treatment: Rehabilitation    SUBJECTIVE:   SUBJECTIVE STATEMENT: Pt reports no  significant issues since last visit. Able to walk a lot over the weekend when it wasn't raining. HEP has been going well.   Pt accompanied by: significant other wife : Dennis Wise   PERTINENT HISTORY:   Per ED visit 09/05/22:   Dennis Wise is a 72 y.o. male with medical history significant of IIDM, HTN, HLD, MGUS, chronic thrombocytopenia, sent from PCPs office for stroke workup.  Patient symptoms started March 21st after he sustained a mechanical fall at his stop when he hit his left shoulder and left rib cage.  No fracture or dislocation.  Since then the patient has had episodes of clumsiness and unsteady  gait and had another 3 episodes of fall at home at different times.  Family also reported that the patient appeared to have memory issue after the initial event in March, as patient started to decreased short memory and complained about worsening of his visions.  Denies any numbness weakness of any of the limbs, no choking or cough or swallowing problems.  Today patient went to see PCP who ordered a outpatient MRI which turned positive for acute right PCA stroke and PCP asked patient to come to ED right away.  Family also reported poorly controlled blood pressure and glucose at home recently.  Pt stroke effected his vision and he has difficulty with peripheral vision and depth perception on the left.  From PT eval:  Pt suffered  RC tear on the left side on 07/13/22 and he is being treated for this via workman's compensation. Has been confirmed with MRI. Pt had stroke on 09/05/22 and went to [facility] ER and was in hospital for 2 days. Pt ambulates with SPC on the R currently. 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 Pt reports his job is more tedious than strenuous. Pt wife reports difficulty with walking in diagonal patterns.   PAIN:  Are you having pain? Yes: NPRS scale: 0/10 Pain location: Left shoulder - being treated  Aggravating factors: rolling over in bed, over use of shoulder  Relieving factors: rest  PRECAUTIONS: Fall  WEIGHT BEARING RESTRICTIONS: No  FALLS: Has patient fallen in last 6 months? Yes. 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   LIVING ENVIRONMENT: Lives with: lives with their family and lives with their  spouse Lives in: House/apartment Stairs: Yes: Internal: steps to basement but does not need to use  steps; on right going up and External: 2 steps; on left going up Has following equipment at home: Single point cane, Quad cane large base, Walker - 2 wheeled, shower chair, and Grab bars  PLOF: Independent  PATIENT GOALS: go back to work, see pertinent history and subjective   OBJECTIVE: (objective measures completed at initial evaluation unless otherwise dated)  DIAGNOSTIC FINDINGS:   IMPRESSION: 1. Acute right PCA distribution infarct. 2. Brain atrophy asymmetrically affecting the anterior right temporal lobe and correlating with the history of memory loss.  COGNITION: Overall cognitive status: Within functional limits for tasks assessed     COORDINATION: Not assessed, assess UE coordination at future session    POSTURE:  rounded shoulders  LOWER EXTREMITY ROM:      (Blank rows = not tested)  LOWER EXTREMITY MMT:    MMT  Right Eval Left Eval  Hip flexion 4+ 4  Hip extension    Hip abduction 4+ 4+  Hip adduction 5 5  Hip internal rotation    Hip external rotation    Knee flexion 4+ 4  Knee extension 5 4+  Ankle dorsiflexion 4+ 4+  Ankle plantarflexion    Ankle inversion    Ankle eversion    (Blank rows = not tested)  BED MOBILITY:  WNL per report   TRANSFERS: Assistive device utilized: Single point cane  Sit to stand: Modified independence Stand to sit: Modified independence Chair to chair: Modified independence Floor:  not assessed   RAMP:  Not assessed   CURB:  Not assessed   STAIRS: Level of Assistance: CGA Stair Negotiation Technique: Step to Pattern with Bilateral Rails Number of Stairs: 4  Height of Stairs: 6 in  Comments: on first step pt foot not properly on step, no LOB  GAIT: Gait pattern: decreased step length- Right, decreased stance time- Right, decreased stride length, and decreased hip/knee flexion- Left Distance walked: 30  ft Assistive device utilized: Single point cane Level of assistance: Modified independence Comments: ambulates with SPC normally but completes DGI with cane.   FUNCTIONAL TESTS:  5 times sit to stand: 14.98 sec 6 minute walk test: 865 ft 10 meter walk test: 11.85 sec Dynamic Gait Index: 11     PATIENT SURVEYS:  FOTO 62 risk adjusted goal of 77  TODAY'S TREATMENT:                                                                                                       NMR: Performed at // bars today with CGA and use of gait belt for all below activities.   On Airex pad: Static 1 x 1 min Horizontal head turns 2 x 1 min Vertical head turns 2 x 1 min  4 inch Stair taps 3 x 10 reps with 5# AW donned VC for foot clearance  Hallway walking x 8 with head turns, asking pt to vocalize specific items on walls, eliciting L and R head turns. Pt improving with ability and tendencies to look to L side  Negative split, x 8 able to achieve ~18 steps. Initially starting with 22 steps.  Stair lateral steps x8 over and back, pt having difficulty with R leg step up slow to perform action. Pt having overall difficulty with motion, and keeping hands off of rails.  Overall, pt able to minimize use of UE in parallel bars, doing will with balance in stair taps. Min A needed to correct LOB on lateral stepping x 2    Rationale for Evaluation and Treatment Rehabilitation  Pt educated throughout session about proper posture and technique with exercises. Improved exercise technique, movement at target joints, use of target muscles after min to mod verbal, visual, tactile cues.   PATIENT EDUCATION: Education details: POC Person educated: Patient Education method: Explanation Education comprehension:  verbalized understanding  HOME EXERCISE PROGRAM: Access Code: [email or web address] URL: [email or web address] Date: 09/19/2022 Prepared by: Thresa Ross  Exercises - Standing March with Counter  Support  - 1 x daily - 7 x weekly - 2 sets - 25 reps - Side Stepping with Counter Support  - 1 x daily - 7 x weekly - 2 sets - 12 reps - standing in split stance with vertical head nods   - 1 x daily - 7 x weekly - 2 sets - 15 reps - Walking with Head Rotation  - 1 x daily - 7 x weekly  GOALS: Goals reviewed with patient? Yes  SHORT TERM GOALS: Target date: 10/16/2022  Patient will be independent in home exercise program to improve strength/mobility for better functional independence with ADLs. Baseline: No HEP currently  Goal status: INITIAL    LONG TERM GOALS: Target date: 12/11/2022  1.  Patient will increase FOTO score to equal to or greater than  73   to demonstrate statistically significant improvement in mobility and quality of life.  Baseline: 63 10/15/22: 72 Goal status: INITIAL   2.  Patient will increase Dynamic gait index Balance score by > 8 points to demonstrate decreased fall risk during functional activities. Baseline: 11 10/15/22: 10 Goal status: INITIAL   3.  Patient will increase 10 meter walk test to >1.3m/s as to improve gait speed for better community ambulation and to reduce fall risk. Baseline: 0.84 m/s 10/15/22: 0.81 m/s Goal status: INITIAL  4.  Patient will increase six minute walk test distance to >1000 for progression to community ambulator and improve gait ability Baseline: 865' 10/15/22: 881' Goal status: INITIAL     ASSESSMENT:  CLINICAL IMPRESSION:  Patient appeared motivated and ready for treatment on this day. Today's session focused on general balance and strength as it relates to pt's progress note. Pt is improving with overall ability to visually scan both L and R sides, evidenced by decreased needs for cues from SPT. Also improving in ability to balance without UE support, but still proves to be a challenge at times. Min A needed from therapist on 2 occasions to correct LOB during lateral stepping. Pt will continue to benefit from skilled  therapy to address remaining deficits in order to improve overall quality of life and return to PLOF.      OBJECTIVE IMPAIRMENTS: Abnormal gait, decreased activity tolerance, decreased balance, decreased endurance, decreased mobility, difficulty walking, and decreased strength.   ACTIVITY LIMITATIONS: carrying, lifting, standing, stairs, and locomotion level  PARTICIPATION LIMITATIONS: driving, shopping, community activity, occupation, and yard work  PERSONAL FACTORS: Age, Fitness, and 3+ comorbidities: HTN, DM, farmer's lung, HLD  are also affecting patient's functional outcome.   REHAB POTENTIAL: Good  CLINICAL DECISION MAKING: Evolving/moderate complexity  EVALUATION COMPLEXITY: Moderate  PLAN:  PT FREQUENCY: 2x/week  PT DURATION: 12 weeks  PLANNED INTERVENTIONS: Therapeutic exercises, Therapeutic activity, Neuromuscular re-education, Balance training, Gait training, Patient/Family education, Self Care, Joint mobilization, Stair training, and Manual therapy  PLAN FOR NEXT SESSION:  Ensure pt has cognitive ability to plug things in in high stress environment, obstacle course navigating around objects ( more for vision than for balance so turns and tight space navigation) side stepping to left over various obstacles/ on obstacles but making sure to familiarize patient with obstacles prior so he can count and accommodate for visual deficits. Continue with blaze pod activity for visual scanning and attention to left side.    Cecile Sheerer, SPT This entire session was performed under  direct supervision and direction of a licensed Estate agent . I have personally read, edited and approve of the note as written.    This licensed clinician was present and actively directing care throughout the session at all times.  Norman Herrlich PT ,DPT Physical Therapist- [facility]    10/27/22, 12:06 PM

## 2022-10-29 ENCOUNTER — Other Ambulatory Visit (INDEPENDENT_AMBULATORY_CARE_PROVIDER_SITE_OTHER): Payer: Medicare HMO

## 2022-10-29 DIAGNOSIS — R748 Abnormal levels of other serum enzymes: Secondary | ICD-10-CM

## 2022-10-29 LAB — HEPATIC FUNCTION PANEL
ALT: 19 U/L (ref 0–53)
AST: 18 U/L (ref 0–37)
Albumin: 3.8 g/dL (ref 3.5–5.2)
Alkaline Phosphatase: 138 U/L — ABNORMAL HIGH (ref 39–117)
Bilirubin, Direct: 0.3 mg/dL (ref 0.0–0.3)
Total Bilirubin: 1 mg/dL (ref 0.2–1.2)
Total Protein: 6.5 g/dL (ref 6.0–8.3)

## 2022-10-31 ENCOUNTER — Ambulatory Visit: Payer: Medicare HMO

## 2022-10-31 DIAGNOSIS — R2689 Other abnormalities of gait and mobility: Secondary | ICD-10-CM | POA: Diagnosis not present

## 2022-10-31 DIAGNOSIS — R2681 Unsteadiness on feet: Secondary | ICD-10-CM | POA: Diagnosis not present

## 2022-10-31 DIAGNOSIS — R269 Unspecified abnormalities of gait and mobility: Secondary | ICD-10-CM | POA: Diagnosis not present

## 2022-10-31 DIAGNOSIS — R262 Difficulty in walking, not elsewhere classified: Secondary | ICD-10-CM

## 2022-10-31 DIAGNOSIS — M6281 Muscle weakness (generalized): Secondary | ICD-10-CM | POA: Diagnosis not present

## 2022-10-31 NOTE — Therapy (Signed)
OUTPATIENT PHYSICAL THERAPY NEURO TREATMENT   Patient Name: Dennis Wise MRN: [id number] DOB:October 15, 1950, 72 y.o., male Today's Date: 10/31/2022  PCP: Dennis Nam, MD  REFERRING PROVIDER: [name], MD   END OF SESSION:  PT End of Session - 10/31/22 0929     Visit Number 13    Number of Visits 24    Date for PT Re-Evaluation 12/11/22    Progress Note Due on Visit 20    PT Start Time 0930    Equipment Utilized During Treatment Gait belt    Activity Tolerance Patient tolerated treatment well    Behavior During Therapy WFL for tasks assessed/performed                Past Medical History:  Diagnosis Date   Anemia    Iron deficiency part of this   Cataract    bilateral lens implants   Diabetes mellitus    type II   [name] (HCC)    GERD (gastroesophageal reflux disease) 01/1989   Helicobacter pylori gastritis 06/11/2011   On EGD 05/2011    Hyperlipidemia    Hypertension 11/1999   Internal hemorrhoids    Iron deficiency anemia, unspecified 03/04/2011   MGUS (monoclonal gammopathy of unknown significance)    possible dx initially, had negative f/u   Pancytopenia, acquired (HCC)    Personal history of colonic adenomas 06/05/2012   Pneumonia    Stroke [facility])    Past Surgical History:  Procedure Laterality Date   CATARACT EXTRACTION Bilateral    COLONOSCOPY     ESOPHAGOGASTRODUODENOSCOPY     2012 H. pylori gastritis   FLEXIBLE SIGMOIDOSCOPY  05/1988   internal hemorrhoids   KNEE SURGERY  12/2002   lt knee fx repair ORIF   TIBIA FRACTURE SURGERY     left 2012   Patient Active Problem List   Diagnosis Date Noted   History of CVA in adulthood 10/01/2022   Anxiety 10/01/2022   Acute ischemic right PCA stroke (HCC) 09/05/2022   Stroke (HCC) 09/05/2022   Memory change 09/03/2022   Shoulder pain 07/02/2022   Monoclonal B-cell lymphocytosis 01/15/2022   Weight loss 01/15/2022   Syncope 11/22/2021   Bilateral carotid artery stenosis  09/23/2021   Nocturia 08/18/2021   IDA (iron deficiency anemia) 07/24/2021   Thrombocytopenia (HCC) 05/03/2021   Normocytic anemia 04/03/2021   Unstable ankle 09/12/2020   Back pain 09/12/2020   Dupuytren contracture 03/03/2019   Healthcare maintenance 02/03/2017   Pseudophakia, both eyes 07/17/2016   Combined form of senile cataract of both eyes 05/02/2016   Hearing loss 10/20/2014   Obesity (BMI 30-39.9) 10/16/2013   Advance care planning 10/16/2013   Actinic keratosis 10/16/2013   Personal history of colonic adenomas 06/05/2012   Pancytopenia, acquired (HCC) 05/01/2011   Medicare annual wellness visit, subsequent 10/24/2010   FOOT PAIN, RIGHT 01/08/2010   Diabetic retinopathy (HCC) 03/21/2005   Essential hypertension 11/20/1999   GERD 01/19/1989   DM type 2 with diabetic background retinopathy (HCC) 04/21/1988   HYPERCHOLESTEROLEMIA  ([id number],TRIG1980) 1990 04/21/1988    ONSET DATE: 07/10/22  REFERRING DIAG: N82.95 (ICD-10-CM) - History of CVA in adulthood   THERAPY DIAG:  Abnormality of gait and mobility  Difficulty in walking, not elsewhere classified  Muscle weakness (generalized)  Unsteadiness on feet  Other abnormalities of gait and mobility  Rationale for Evaluation and Treatment: Rehabilitation    SUBJECTIVE:   SUBJECTIVE STATEMENT: Patient reports some pain with Left shoulder with cooler rainy weather  Pt accompanied by: significant other wife : Dennis Wise   PERTINENT HISTORY:   Per ED visit 09/05/22:   Dennis Wise is a 72 y.o. male with medical history significant of IIDM, HTN, HLD, MGUS, chronic thrombocytopenia, sent from PCPs office for stroke workup.  Patient symptoms started March 21st after he sustained a mechanical fall at his stop when he hit his left shoulder and left rib cage.  No fracture or dislocation.  Since then the patient has had episodes of clumsiness and unsteady gait and had another 3 episodes of fall at home at different times.  Family  also reported that the patient appeared to have memory issue after the initial event in March, as patient started to decreased short memory and complained about worsening of his visions.  Denies any numbness weakness of any of the limbs, no choking or cough or swallowing problems.  Today patient went to see PCP who ordered a outpatient MRI which turned positive for acute right PCA stroke and PCP asked patient to come to ED right away.  Family also reported poorly controlled blood pressure and glucose at home recently.  Pt stroke effected his vision and he has difficulty with peripheral vision and depth perception on the left.  From PT eval:  Pt suffered  RC tear on the left side on 07/13/22 and he is being treated for this via workman's compensation. Has been confirmed with MRI. Pt had stroke on 09/05/22 and went to [facility] and was in hospital for 2 days. Pt ambulates with SPC on the R currently. 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 [facility] has him hired for 20 days August 21-Sept 9 and he wants to give them certainty that he will be able to do this or not. Pt reports his job is more tedious than strenuous. Pt wife reports difficulty with walking in diagonal patterns.   PAIN:  Are you having pain? Yes: NPRS scale: 0/10 Pain location: Left shoulder - being treated  Aggravating factors: rolling over in bed, over use of shoulder  Relieving factors: rest  PRECAUTIONS: Fall  WEIGHT BEARING RESTRICTIONS: No  FALLS: Has patient fallen in last 6 months? Yes. 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   LIVING ENVIRONMENT: Lives with: lives with their family and lives with their spouse Lives in: House/apartment Stairs: Yes: Internal: steps to basement but  does not need to use  steps; on right going up and External: 2 steps; on left going up Has following equipment at home: Single point cane, Quad cane large base, Walker - 2 wheeled, shower chair, and Grab bars  PLOF: Independent  PATIENT GOALS: go back to work, see pertinent history and subjective   OBJECTIVE: (objective measures completed at initial evaluation unless otherwise dated)  DIAGNOSTIC FINDINGS:   IMPRESSION: 1. Acute right PCA distribution infarct. 2. Brain atrophy asymmetrically affecting the anterior right temporal lobe and correlating with the history of memory loss.  COGNITION: Overall cognitive status: Within functional limits for tasks assessed     COORDINATION: Not assessed, assess UE coordination at future session    POSTURE: rounded shoulders  LOWER EXTREMITY ROM:      (Blank rows = not tested)  LOWER EXTREMITY MMT:    MMT  Right Eval Left Eval  Hip flexion 4+ 4  Hip extension    Hip abduction 4+ 4+  Hip adduction 5 5  Hip internal rotation    Hip external rotation    Knee flexion 4+ 4  Knee extension 5 4+  Ankle dorsiflexion 4+ 4+  Ankle plantarflexion    Ankle inversion    Ankle eversion    (Blank rows = not tested)  BED MOBILITY:  WNL per report   TRANSFERS: Assistive device utilized: Single point cane  Sit to stand: Modified independence Stand to sit: Modified independence Chair to chair: Modified independence Floor:  not assessed   RAMP:  Not assessed   CURB:  Not assessed   STAIRS: Level of Assistance: CGA Stair Negotiation Technique: Step to Pattern with Bilateral Rails Number of Stairs: 4  Height of Stairs: 6 in  Comments: on first step pt foot not properly on step, no LOB  GAIT: Gait pattern: decreased step length- Right, decreased stance time- Right, decreased stride length, and decreased hip/knee flexion- Left Distance walked: 30 ft Assistive device utilized: Single point cane Level of assistance: Modified  independence Comments: ambulates with SPC normally but completes DGI with cane.   FUNCTIONAL TESTS:  5 times sit to stand: 14.98 sec 6 minute walk test: 865 ft 10 meter walk test: 11.85 sec Dynamic Gait Index: 11     PATIENT SURVEYS:  FOTO 62 risk adjusted goal of 77  TODAY'S TREATMENT:                                                                                                       NMR: Performed at support bar today with CGA and use of gait belt for all below activities.   Step taps  3 x 10 reps without UE support- VC for foot clearance  Activity Description: Random - 4 pods in line on floor- patient instructed to side step to lit up color Activity Setting:  Random- The Blaze Pod Random setting was chosen to enhance cognitive processing and agility, providing an unpredictable environment to simulate real-world scenarios, and fostering quick reactions and adaptability.   Number of Pods:  4 Cycles/Sets:  4 Duration (Time or Hit Count):  30 sec  Patient Stats  Hits:   1) 8 2) 7 3) 9 4) 10   Activity Description: Random- 4 pods in a 3x3 foot square Activity Setting:  Random- The Blaze Pod Random setting was chosen to enhance cognitive processing and agility, providing an unpredictable environment to simulate real-world scenarios, and fostering quick reactions and adaptability.   Number of Pods:  4 Cycles/Sets:  4 Duration (Time or Hit Count):  30  Patient Stats  Hits:   1) 8 2) 9 3) 9 4) 10       Hallway walking x 2 with head turns, asking pt to vocalize specific items on walls, eliciting L and R head turns. 1st trial- Patient able to call out 21/33 items 2nd trial (after VC to increase scanning) - 27/33 items.      Negative split with 10 metter  x 8 able to achieve at best 16 ( on last 3 trials)  steps. Initially starting with 20 steps.  Scavenger hunt for cones in clinic x 7 cones- Patient only missed 1 cone and able to locate on 2nd trial.    THEREX:   Sit to  stand without UE x 12 reps  Rationale for Evaluation and Treatment Rehabilitation  Pt educated throughout session about proper posture and technique with exercises. Improved exercise technique, movement at target joints, use of target muscles after min to mod verbal, visual, tactile cues.   PATIENT EDUCATION: Education details: POC Person educated: Patient Education method: Explanation Education comprehension: verbalized understanding  HOME EXERCISE PROGRAM: Access Code: [email or web address] URL: [email or web address] Date: 09/19/2022 Prepared by: Thresa Ross  Exercises - Standing March with Counter Support  - 1 x daily - 7 x weekly - 2 sets - 25 reps - Side Stepping with Counter Support  - 1 x daily - 7 x weekly - 2 sets - 12 reps - standing in split stance with vertical head nods   - 1 x daily - 7 x weekly - 2 sets - 15 reps - Walking with Head Rotation  - 1 x daily - 7 x weekly  GOALS: Goals reviewed with patient? Yes  SHORT TERM GOALS: Target date: 10/16/2022  Patient will be independent in home exercise program to improve strength/mobility for better functional independence with ADLs. Baseline: No HEP currently  Goal status: INITIAL    LONG TERM GOALS: Target date: 12/11/2022  1.  Patient will increase FOTO score to equal to or greater than  73   to demonstrate statistically significant improvement in mobility and quality of life.  Baseline: 63 10/15/22: 72 Goal status: INITIAL   2.  Patient will increase Dynamic gait index Balance score by > 8 points to demonstrate decreased fall risk during functional activities. Baseline: 11 10/15/22: 10 Goal status: INITIAL   3.  Patient will increase 10 meter walk test to >1.38m/s as to improve gait speed for better community ambulation and to reduce fall risk. Baseline: 0.84 m/s 10/15/22: 0.81 m/s Goal status: INITIAL  4.  Patient will increase six minute walk test distance to >1000 for progression to community  ambulator and improve gait ability Baseline: 865' 10/15/22: 881' Goal status: INITIAL     ASSESSMENT:  CLINICAL IMPRESSION: Today session continued to focus primarily on balance and visual scanning. Patient demo improving ability to adapt quick and visually scan better with practice today. He continues to exhibit left side neglect and requires some VC to focus/scan on left side. Pt will continue to benefit from skilled therapy to address remaining deficits in order to improve overall quality of life and return to PLOF.      OBJECTIVE IMPAIRMENTS: Abnormal gait, decreased activity tolerance, decreased balance, decreased endurance, decreased mobility, difficulty walking, and decreased strength.   ACTIVITY LIMITATIONS: carrying, lifting, standing, stairs, and locomotion level  PARTICIPATION LIMITATIONS: driving, shopping, community activity, occupation, and yard work  PERSONAL FACTORS: Age, Fitness, and 3+ comorbidities: HTN, DM, farmer's lung, HLD  are also affecting patient's functional outcome.   REHAB POTENTIAL: Good  CLINICAL DECISION MAKING: Evolving/moderate complexity  EVALUATION COMPLEXITY: Moderate  PLAN:  PT FREQUENCY: 2x/week  PT DURATION: 12 weeks  PLANNED INTERVENTIONS: Therapeutic exercises, Therapeutic activity, Neuromuscular re-education, Balance training, Gait training, Patient/Family education, Self Care, Joint mobilization, Stair training, and Manual therapy  PLAN FOR NEXT SESSION:  Ensure pt has cognitive ability to plug things in in high stress environment,  obstacle course navigating around objects ( more for vision than for balance so turns and tight space navigation) side stepping to left over various obstacles/ on obstacles but making sure to familiarize patient with obstacles prior so he can count and accommodate for visual deficits. Continue with blaze pod activity for visual scanning and attention to left side.    Lenda Kelp PT  Physical  Therapist- [facility]    10/31/22, 9:30 AM

## 2022-11-03 ENCOUNTER — Ambulatory Visit: Payer: Medicare HMO

## 2022-11-03 DIAGNOSIS — R2681 Unsteadiness on feet: Secondary | ICD-10-CM | POA: Diagnosis not present

## 2022-11-03 DIAGNOSIS — R2689 Other abnormalities of gait and mobility: Secondary | ICD-10-CM | POA: Diagnosis not present

## 2022-11-03 DIAGNOSIS — R269 Unspecified abnormalities of gait and mobility: Secondary | ICD-10-CM

## 2022-11-03 DIAGNOSIS — R262 Difficulty in walking, not elsewhere classified: Secondary | ICD-10-CM

## 2022-11-03 DIAGNOSIS — M6281 Muscle weakness (generalized): Secondary | ICD-10-CM | POA: Diagnosis not present

## 2022-11-03 NOTE — Therapy (Signed)
OUTPATIENT PHYSICAL THERAPY NEURO TREATMENT   Patient Name: Dennis Wise MRN: [id number] DOB:May 03, 1950, 72 y.o., male Today's Date: 11/03/2022  PCP: Dennis Nam, MD  REFERRING PROVIDER: [name], MD   END OF SESSION:  PT End of Session - 11/03/22 1106     Visit Number 14    Number of Visits 24    Date for PT Re-Evaluation 12/11/22    Authorization Type [employer] HMO    Progress Note Due on Visit 20    PT Start Time 1100    PT Stop Time 1140    PT Time Calculation (min) 40 min    Equipment Utilized During Treatment Gait belt    Activity Tolerance Patient tolerated treatment well    Behavior During Therapy WFL for tasks assessed/performed                Past Medical History:  Diagnosis Date   Anemia    Iron deficiency part of this   Cataract    bilateral lens implants   Diabetes mellitus    type II   [name] (HCC)    GERD (gastroesophageal reflux disease) 01/1989   Helicobacter pylori gastritis 06/11/2011   On EGD 05/2011    Hyperlipidemia    Hypertension 11/1999   Internal hemorrhoids    Iron deficiency anemia, unspecified 03/04/2011   MGUS (monoclonal gammopathy of unknown significance)    possible dx initially, had negative f/u   Pancytopenia, acquired (HCC)    Personal history of colonic adenomas 06/05/2012   Pneumonia    Stroke [facility])    Past Surgical History:  Procedure Laterality Date   CATARACT EXTRACTION Bilateral    COLONOSCOPY     ESOPHAGOGASTRODUODENOSCOPY     2012 H. pylori gastritis   FLEXIBLE SIGMOIDOSCOPY  05/1988   internal hemorrhoids   KNEE SURGERY  12/2002   lt knee fx repair ORIF   TIBIA FRACTURE SURGERY     left 2012   Patient Active Problem List   Diagnosis Date Noted   History of CVA in adulthood 10/01/2022   Anxiety 10/01/2022   Acute ischemic right PCA stroke (HCC) 09/05/2022   Stroke (HCC) 09/05/2022   Memory change 09/03/2022   Shoulder pain 07/02/2022   Monoclonal B-cell lymphocytosis  01/15/2022   Weight loss 01/15/2022   Syncope 11/22/2021   Bilateral carotid artery stenosis 09/23/2021   Nocturia 08/18/2021   IDA (iron deficiency anemia) 07/24/2021   Thrombocytopenia (HCC) 05/03/2021   Normocytic anemia 04/03/2021   Unstable ankle 09/12/2020   Back pain 09/12/2020   Dupuytren contracture 03/03/2019   Healthcare maintenance 02/03/2017   Pseudophakia, both eyes 07/17/2016   Combined form of senile cataract of both eyes 05/02/2016   Hearing loss 10/20/2014   Obesity (BMI 30-39.9) 10/16/2013   Advance care planning 10/16/2013   Actinic keratosis 10/16/2013   Personal history of colonic adenomas 06/05/2012   Pancytopenia, acquired (HCC) 05/01/2011   Medicare annual wellness visit, subsequent 10/24/2010   FOOT PAIN, RIGHT 01/08/2010   Diabetic retinopathy (HCC) 03/21/2005   Essential hypertension 11/20/1999   GERD 01/19/1989   DM type 2 with diabetic background retinopathy (HCC) 04/21/1988   HYPERCHOLESTEROLEMIA  (353,TRIG1980) 1990 04/21/1988    ONSET DATE: 07/10/22  REFERRING DIAG: N82.95 (ICD-10-CM) - History of CVA in adulthood   THERAPY DIAG:  Abnormality of gait and mobility  Difficulty in walking, not elsewhere classified  Muscle weakness (generalized)  Unsteadiness on feet  Other abnormalities of gait and mobility  Rationale for  Evaluation and Treatment: Rehabilitation    SUBJECTIVE:   SUBJECTIVE STATEMENT: Pt reports no major updates today. He is feeling ok. Rehab for shoulder in going well.   Pt accompanied by: significant other wife : [facility]   PERTINENT HISTORY:   Per ED visit 09/05/22:   Dennis Wise is a 72 y.o. male with medical history significant of IIDM, HTN, HLD, MGUS, chronic thrombocytopenia, sent from PCPs office for stroke workup.  Patient symptoms started March 21st after he sustained a mechanical fall at his stop when he hit his left shoulder and left rib cage.  No fracture or dislocation.  Since then the patient has had  episodes of clumsiness and unsteady gait and had another 3 episodes of fall at home at different times.  Family also reported that the patient appeared to have memory issue after the initial event in March, as patient started to decreased short memory and complained about worsening of his visions.  Denies any numbness weakness of any of the limbs, no choking or cough or swallowing problems.  Today patient went to see PCP who ordered a outpatient MRI which turned positive for acute right PCA stroke and PCP asked patient to come to ED right away.  Family also reported poorly controlled blood pressure and glucose at home recently.  Pt stroke effected his vision and he has difficulty with peripheral vision and depth perception on the left.  From PT eval:  Pt suffered  RC tear on the left side on 07/13/22 and he is being treated for this via workman's compensation. Has been confirmed with MRI. Pt had stroke on 09/05/22 and went to [facility] ER and was in hospital for 2 days. Pt ambulates with SPC on the R currently. 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 Pt reports his job is more tedious than strenuous. Pt wife reports difficulty with walking in diagonal patterns.   PAIN:  Are you having pain? Yes: NPRS scale: 2/10  PRECAUTIONS: Fall  WEIGHT BEARING RESTRICTIONS: No  FALLS: Has patient fallen in last 6 months? Yes. 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    PATIENT GOALS: go back to work, see pertinent history and subjective   OBJECTIVE: (objective measures completed at initial evaluation unless otherwise dated)   TODAY'S  TREATMENT:                                                                                                       -STS from chair x10, LUE SPC  -STS from elevated surface (23") STS c green foam under unlateral foot, 6x bilat -AMB to cafeteria area  -Navigation through seating dining room for visual scanning  practice, SPC, navigation, scanning order area, avoiding persons,  -AMB back to rehab area  -soccer ball kicking rebounding x30  -AMB in clinic looking for outlets and light switches. X 6 minutes    PATIENT EDUCATION: Education details: POC Person educated: Patient Education method: Explanation Education comprehension: verbalized understanding  HOME EXERCISE PROGRAM: Access Code: [email or web address] URL: [email or web address] Date: 09/19/2022 Prepared by: Thresa Ross  Exercises - Standing March with Counter Support  - 1 x daily - 7 x weekly - 2 sets - 25 reps - Side Stepping with Counter Support  - 1 x daily - 7 x weekly - 2 sets - 12 reps - standing in split stance with vertical head nods   - 1 x daily - 7 x weekly - 2 sets - 15 reps - Walking with Head Rotation  - 1 x daily - 7 x weekly  GOALS: Goals reviewed with patient? Yes  SHORT TERM GOALS: Target date: 10/16/2022  Patient will be independent in home exercise program to improve strength/mobility for better functional independence with ADLs. Baseline: No HEP currently  Goal status: INITIAL   LONG TERM GOALS: Target date: 12/11/2022  1.  Patient will increase FOTO score to equal to or greater than  73   to demonstrate statistically significant improvement in mobility and quality of life.  Baseline: 63 10/15/22: 72 Goal status: INITIAL   2.  Patient will increase Dynamic gait index Balance score by > 8 points to demonstrate decreased fall risk during functional activities. Baseline: 11 10/15/22: 10 Goal status: INITIAL   3.  Patient will increase 10 meter walk test to >1.43m/s as to improve gait speed for  better community ambulation and to reduce fall risk. Baseline: 0.84 m/s 10/15/22: 0.81 m/s Goal status: INITIAL  4.  Patient will increase six minute walk test distance to >1000 for progression to community ambulator and improve gait ability Baseline: 865' 10/15/22: 881' Goal status: INITIAL   ASSESSMENT:  CLINICAL IMPRESSION: Still working on balance and visual scanning. He continues to exhibit left side neglect and requires some VC to focus/scan on left side. No significant LOB in session. Pt will continue to benefit from skilled therapy to address remaining deficits in order to improve overall quality of life and return to PLOF.      OBJECTIVE IMPAIRMENTS: Abnormal gait, decreased activity tolerance, decreased balance, decreased endurance, decreased mobility, difficulty walking, and decreased strength.   ACTIVITY LIMITATIONS: carrying, lifting, standing, stairs, and locomotion level  PARTICIPATION LIMITATIONS: driving, shopping, community activity, occupation, and yard work  PERSONAL FACTORS: Age, Fitness, and 3+ comorbidities: HTN, DM, farmer's lung, HLD  are also affecting patient's functional outcome.   REHAB POTENTIAL: Good  CLINICAL DECISION MAKING: Evolving/moderate complexity  EVALUATION COMPLEXITY: Moderate  PLAN:  PT FREQUENCY: 2x/week  PT DURATION: 12 weeks  PLANNED INTERVENTIONS: Therapeutic exercises, Therapeutic activity, Neuromuscular re-education, Balance training, Gait training, Patient/Family education, Self Care, Joint mobilization, Stair training, and Manual therapy  PLAN FOR NEXT SESSION:  Ensure pt has cognitive ability to plug things in in high stress environment, obstacle course navigating around objects ( more for vision than for balance so turns and tight space navigation) side stepping to left over various obstacles/ on obstacles but making sure to familiarize patient with obstacles prior so he can count and accommodate for visual deficits. Continue  with blaze pod activity for visual scanning and attention to left side.    11:09 AM, 11/03/22 Rosamaria Lints, PT, DPT Physical Therapist - [facility]  Outpatient Physical Therapy- [facility] [phone]     Rosamaria Lints PT  Physical Therapist- [facility]  [facility]    11/03/22, 11:09 AM

## 2022-11-06 ENCOUNTER — Ambulatory Visit: Payer: Medicare HMO | Admitting: Physical Therapy

## 2022-11-06 ENCOUNTER — Encounter: Payer: Self-pay | Admitting: Physical Therapy

## 2022-11-06 DIAGNOSIS — R269 Unspecified abnormalities of gait and mobility: Secondary | ICD-10-CM | POA: Diagnosis not present

## 2022-11-06 DIAGNOSIS — M6281 Muscle weakness (generalized): Secondary | ICD-10-CM

## 2022-11-06 DIAGNOSIS — R2689 Other abnormalities of gait and mobility: Secondary | ICD-10-CM

## 2022-11-06 DIAGNOSIS — R2681 Unsteadiness on feet: Secondary | ICD-10-CM | POA: Diagnosis not present

## 2022-11-06 DIAGNOSIS — R262 Difficulty in walking, not elsewhere classified: Secondary | ICD-10-CM | POA: Diagnosis not present

## 2022-11-06 NOTE — Therapy (Signed)
OUTPATIENT PHYSICAL THERAPY NEURO TREATMENT   Patient Name: Dennis Wise MRN: [id number] DOB:Sep 09, 1950, 72 y.o., male Today's Date: 11/06/2022  PCP: [name], MD  REFERRING PROVIDER: [name], MD   END OF SESSION:  PT End of Session - 11/06/22 0922     Visit Number 15    Number of Visits 24    Date for PT Re-Evaluation 12/11/22    Authorization Type [employer] H[employer]    Progress Note Due on Visit 20    PT Start Time 0932    PT Stop Time 1012    PT Time Calculation (min) 40 min    Equipment Utilized During Treatment Gait belt    Activity Tolerance Patient tolerated treatment well    Behavior During Therapy WFL for tasks assessed/performed                 Past Medical History:  Diagnosis Date   Anemia    Iron deficiency part of this   Cataract    bilateral lens implants   Diabetes mellitus    type II   [name] (HCC)    GERD (gastroesophageal reflux disease) 01/1989   Helicobacter pylori gastritis 06/11/2011   On EGD 05/2011    Hyperlipidemia    Hypertension 11/1999   Internal hemorrhoids    Iron deficiency anemia, unspecified 03/04/2011   MGUS (monoclonal gammopathy of unknown significance)    possible dx initially, had negative f/u   Pancytopenia, acquired (HCC)    Personal history of colonic adenomas 06/05/2012   Pneumonia    Stroke [facility])    Past Surgical History:  Procedure Laterality Date   CATARACT EXTRACTION Bilateral    COLONOSCOPY     ESOPHAGOGASTRODUODENOSCOPY     2012 H. pylori gastritis   FLEXIBLE SIGMOIDOSCOPY  05/1988   internal hemorrhoids   KNEE SURGERY  12/2002   lt knee fx repair ORIF   TIBIA FRACTURE SURGERY     left 2012   Patient Active Problem List   Diagnosis Date Noted   History of CVA in adulthood 10/01/2022   Anxiety 10/01/2022   Acute ischemic right PCA stroke (HCC) 09/05/2022   Stroke (HCC) 09/05/2022   Memory change 09/03/2022   Shoulder pain 07/02/2022   Monoclonal B-cell lymphocytosis  01/15/2022   Weight loss 01/15/2022   Syncope 11/22/2021   Bilateral carotid artery stenosis 09/23/2021   Nocturia 08/18/2021   IDA (iron deficiency anemia) 07/24/2021   Thrombocytopenia (HCC) 05/03/2021   Normocytic anemia 04/03/2021   Unstable ankle 09/12/2020   Back pain 09/12/2020   Dupuytren contracture 03/03/2019   Healthcare maintenance 02/03/2017   Pseudophakia, both eyes 07/17/2016   Combined form of senile cataract of both eyes 05/02/2016   Hearing loss 10/20/2014   Obesity (BMI 30-39.9) 10/16/2013   Advance care planning 10/16/2013   Actinic keratosis 10/16/2013   Personal history of colonic adenomas 06/05/2012   Pancytopenia, acquired (HCC) 05/01/2011   Medicare annual wellness visit, subsequent 10/24/2010   FOOT PAIN, RIGHT 01/08/2010   Diabetic retinopathy (HCC) 03/21/2005   Essential hypertension 11/20/1999   GERD 01/19/1989   DM type 2 with diabetic background retinopathy (HCC) 04/21/1988   HYPERCHOLESTEROLEMIA  (353,TRIG1980) 1990 04/21/1988    ONSET DATE: 07/10/22  REFERRING DIAG: W09.81 (ICD-10-CM) - History of CVA in adulthood   THERAPY DIAG:  Abnormality of gait and mobility  Difficulty in walking, not elsewhere classified  Muscle weakness (generalized)  Unsteadiness on feet  Other abnormalities of gait and mobility  Rationale  for Evaluation and Treatment: Rehabilitation    SUBJECTIVE:   SUBJECTIVE STATEMENT:  Pt reports no major updates today. Reports he is still stressed about getting back to work.   Pt accompanied by: significant other wife : Dennis Wise   PERTINENT HISTORY:   Per ED visit 09/05/22:   ABDULWAHAB DEMELO is a 72 y.o. male with medical history significant of IIDM, HTN, HLD, MGUS, chronic thrombocytopenia, sent from PCPs office for stroke workup.  Patient symptoms started March 21st after he sustained a mechanical fall at his shop when he hit his left shoulder and left rib cage.  No fracture or dislocation.  Since then the patient has  had episodes of clumsiness and unsteady gait and had another 3 episodes of fall at home at different times.  Family also reported that the patient appeared to have memory issue after the initial event in March, as patient started to decreased short memory and complained about worsening of his visions.  Denies any numbness weakness of any of the limbs, no choking or cough or swallowing problems.  Today patient went to see PCP who ordered a outpatient MRI which turned positive for acute right PCA stroke and PCP asked patient to come to ED right away.  Family also reported poorly controlled blood pressure and glucose at home recently.  Pt stroke effected his vision and he has difficulty with peripheral vision and depth perception on the left.  From PT eval:  Pt suffered  RC tear on the left side on 07/13/22 and he is being treated for this via workman's compensation. Has been confirmed with MRI. Pt had stroke on 09/05/22 and went to [facility] ER and was in hospital for 2 days. Pt ambulates with SPC on the R currently. 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 Pt reports his job is more tedious than strenuous. Pt wife reports difficulty with walking in diagonal patterns.   PAIN:  Are you having pain? Yes: NPRS scale: 2/10  PRECAUTIONS: Fall  WEIGHT BEARING RESTRICTIONS: No  FALLS: Has patient fallen in last 6 months? Yes. 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    PATIENT GOALS: go back to work, see pertinent history and subjective   OBJECTIVE: (objective measures completed at initial evaluation unless otherwise dated)   TODAY'S  TREATMENT:                                                                                                 NMR -AMB to healing garden  area with pt focusing on environment for future challenge of returning to where we started in same order.  -Navigation through seating dining room for visual scanning practice, SPC, navigation, scanning order  area, avoiding persons. Focus on objects on the left with cues for attention to these things.  -AMB back to rehab area with PT attemtping to take the same route per instruction from PT, 3 errors with left turns and due to inattention ot this side, pt tends to ambulate on the right side of hallway or sidewalk -STS from chair x5 no UE, 2 x 5 ea LE with 1 Le on airex pad  -Dual task balance on airex playing catch, throwing from and to his L side only to work on neglect on this side . X several minutes  -Sidestepping onto/ off of airex then over orange hurdle x 10 reps ea side with UE support  -Forward and retro stepping on / off airex with intermittent UE assist x 10    PATIENT EDUCATION: Education details: POC Person educated: Patient Education method: Explanation Education comprehension: verbalized understanding  HOME EXERCISE PROGRAM: Access Code: [email or web address] URL: [email or web address] Date: 09/19/2022 Prepared by: Thresa Ross  Exercises - Standing March with Counter Support  - 1 x daily - 7 x weekly - 2 sets - 25 reps - Side Stepping with Counter Support  - 1 x daily - 7 x weekly - 2 sets - 12 reps - standing in split stance with vertical head nods   - 1 x daily - 7 x weekly - 2 sets - 15 reps - Walking with Head Rotation  - 1 x daily - 7 x weekly  GOALS: Goals reviewed with patient? Yes  SHORT TERM GOALS: Target date: 10/16/2022  Patient will be independent in home exercise program to improve strength/mobility for better functional independence with ADLs. Baseline: No HEP currently  Goal status: INITIAL   LONG TERM GOALS:  Target date: 12/11/2022  1.  Patient will increase FOTO score to equal to or greater than  73   to demonstrate statistically significant improvement in mobility and quality of life.  Baseline: 63 10/15/22: 72 Goal status: INITIAL   2.  Patient will increase Dynamic gait index Balance score by > 8 points to demonstrate decreased fall risk during functional activities. Baseline: 11 10/15/22: 10 Goal status: INITIAL   3.  Patient will increase 10 meter walk test to >1.35m/s as to improve gait speed for better community ambulation and to reduce fall risk. Baseline: 0.84 m/s 10/15/22: 0.81 m/s Goal status: INITIAL  4.  Patient will increase six minute walk test distance to >1000 for progression to community ambulator and improve gait ability Baseline: 865' 10/15/22: 881' Goal status: INITIAL   ASSESSMENT:  CLINICAL IMPRESSION: Pt presents with good motivation for completion of PT activities. Continuing to work on LE strength and and functional movements.  Also continuing to work on navigating various terrains to mimic his workplace environment but did require upper extremity assist for higher level balance on various terrains.  Patient still having difficulty with neglect to the left side and with several turns only to the left when challenged with returning to start position.  Will continue to progress this in future sessions as is appropriate.  Pt will continue to benefit from skilled therapy to address remaining deficits in order to improve overall quality of life and return to PLOF.      OBJECTIVE IMPAIRMENTS: Abnormal gait, decreased activity tolerance, decreased balance, decreased endurance, decreased mobility, difficulty walking, and decreased strength.   ACTIVITY LIMITATIONS: carrying, lifting, standing, stairs, and locomotion level  PARTICIPATION LIMITATIONS: driving, shopping, community activity, occupation, and yard work  PERSONAL FACTORS: Age, Fitness, and  3+ comorbidities: HTN,  DM, farmer's lung, HLD  are also affecting patient's functional outcome.   REHAB POTENTIAL: Good  CLINICAL DECISION MAKING: Evolving/moderate complexity  EVALUATION COMPLEXITY: Moderate  PLAN:  PT FREQUENCY: 2x/week  PT DURATION: 12 weeks  PLANNED INTERVENTIONS: Therapeutic exercises, Therapeutic activity, Neuromuscular re-education, Balance training, Gait training, Patient/Family education, Self Care, Joint mobilization, Stair training, and Manual therapy  PLAN FOR NEXT SESSION:   Ensure pt has cognitive ability to plug things in in high stress environment, obstacle course navigating around objects ( more for vision than for balance so turns and tight space navigation) side stepping to left over various obstacles/ on obstacles but making sure to familiarize patient with obstacles prior so he can count and accommodate for visual deficits. Continue with blaze pod activity for visual scanning and attention to left side.     Norman Herrlich PT  Physical Therapist- [facility]    11/06/22, 9:52 AM

## 2022-11-10 ENCOUNTER — Ambulatory Visit: Payer: Medicare HMO

## 2022-11-10 DIAGNOSIS — R262 Difficulty in walking, not elsewhere classified: Secondary | ICD-10-CM | POA: Diagnosis not present

## 2022-11-10 DIAGNOSIS — M6281 Muscle weakness (generalized): Secondary | ICD-10-CM

## 2022-11-10 DIAGNOSIS — R2689 Other abnormalities of gait and mobility: Secondary | ICD-10-CM

## 2022-11-10 DIAGNOSIS — R2681 Unsteadiness on feet: Secondary | ICD-10-CM | POA: Diagnosis not present

## 2022-11-10 DIAGNOSIS — R269 Unspecified abnormalities of gait and mobility: Secondary | ICD-10-CM | POA: Diagnosis not present

## 2022-11-10 NOTE — Therapy (Signed)
OUTPATIENT PHYSICAL THERAPY NEURO TREATMENT   Patient Name: ROCK SOBOL MRN: [id number] DOB:10/15/1950, 72 y.o., male Today's Date: 11/10/2022  PCP: [name], MD  REFERRING PROVIDER: [name], MD   END OF SESSION:  PT End of Session - 11/10/22 1024     Visit Number 16    Number of Visits 24    Date for PT Re-Evaluation 12/11/22    Authorization Type [employer] HMO    Progress Note Due on Visit 20    PT Start Time 1017    PT Stop Time 1059    PT Time Calculation (min) 42 min    Equipment Utilized During Treatment Gait belt    Activity Tolerance Patient tolerated treatment well    Behavior During Therapy WFL for tasks assessed/performed                  Past Medical History:  Diagnosis Date   Anemia    Iron deficiency part of this   Cataract    bilateral lens implants   Diabetes mellitus    type II   [name] (HCC)    GERD (gastroesophageal reflux disease) 01/1989   Helicobacter pylori gastritis 06/11/2011   On EGD 05/2011    Hyperlipidemia    Hypertension 11/1999   Internal hemorrhoids    Iron deficiency anemia, unspecified 03/04/2011   MGUS (monoclonal gammopathy of unknown significance)    possible dx initially, had negative f/u   Pancytopenia, acquired (HCC)    Personal history of colonic adenomas 06/05/2012   Pneumonia    Stroke [facility])    Past Surgical History:  Procedure Laterality Date   CATARACT EXTRACTION Bilateral    COLONOSCOPY     ESOPHAGOGASTRODUODENOSCOPY     2012 H. pylori gastritis   FLEXIBLE SIGMOIDOSCOPY  05/1988   internal hemorrhoids   KNEE SURGERY  12/2002   lt knee fx repair ORIF   TIBIA FRACTURE SURGERY     left 2012   Patient Active Problem List   Diagnosis Date Noted   History of CVA in adulthood 10/01/2022   Anxiety 10/01/2022   Acute ischemic right PCA stroke (HCC) 09/05/2022   Stroke (HCC) 09/05/2022   Memory change 09/03/2022   Shoulder pain 07/02/2022   Monoclonal B-cell  lymphocytosis 01/15/2022   Weight loss 01/15/2022   Syncope 11/22/2021   Bilateral carotid artery stenosis 09/23/2021   Nocturia 08/18/2021   IDA (iron deficiency anemia) 07/24/2021   Thrombocytopenia (HCC) 05/03/2021   Normocytic anemia 04/03/2021   Unstable ankle 09/12/2020   Back pain 09/12/2020   Dupuytren contracture 03/03/2019   Healthcare maintenance 02/03/2017   Pseudophakia, both eyes 07/17/2016   Combined form of senile cataract of both eyes 05/02/2016   Hearing loss 10/20/2014   Obesity (BMI 30-39.9) 10/16/2013   Advance care planning 10/16/2013   Actinic keratosis 10/16/2013   Personal history of colonic adenomas 06/05/2012   Pancytopenia, acquired (HCC) 05/01/2011   Medicare annual wellness visit, subsequent 10/24/2010   FOOT PAIN, RIGHT 01/08/2010   Diabetic retinopathy (HCC) 03/21/2005   Essential hypertension 11/20/1999   GERD 01/19/1989   DM type 2 with diabetic background retinopathy (HCC) 04/21/1988   HYPERCHOLESTEROLEMIA  (353,TRIG1980) 1990 04/21/1988    ONSET DATE: 07/10/22  REFERRING DIAG: J18.84 (ICD-10-CM) - History of CVA in adulthood   THERAPY DIAG:  Abnormality of gait and mobility  Difficulty in walking, not elsewhere classified  Muscle weakness (generalized)  Unsteadiness on feet  Other abnormalities of gait and mobility  Rationale for Evaluation and Treatment: Rehabilitation    SUBJECTIVE:   SUBJECTIVE STATEMENT:  Patient reports feels like he is making some progress and working hard at home to improve his neglect.   Pt accompanied by: significant other wife : [name]   PERTINENT HISTORY:   Per ED visit 09/05/22:   DAYMON HORA is a 72 y.o. male with medical history significant of IIDM, HTN, HLD, MGUS, chronic thrombocytopenia, sent from PCPs office for stroke workup.  Patient symptoms started March 21st after he sustained a mechanical fall at his shop when he hit his left shoulder and left rib cage.  No fracture or dislocation.   Since then the patient has had episodes of clumsiness and unsteady gait and had another 3 episodes of fall at home at different times.  Family also reported that the patient appeared to have memory issue after the initial event in March, as patient started to decreased short memory and complained about worsening of his visions.  Denies any numbness weakness of any of the limbs, no choking or cough or swallowing problems.  Today patient went to see PCP who ordered a outpatient MRI which turned positive for acute right PCA stroke and PCP asked patient to come to ED right away.  Family also reported poorly controlled blood pressure and glucose at home recently.  Pt stroke effected his vision and he has difficulty with peripheral vision and depth perception on the left.  From PT eval:  Pt suffered  RC tear on the left side on 07/13/22 and he is being treated for this via workman's compensation. Has been confirmed with MRI. Pt had stroke on 09/05/22 and went to [facility] ER and was in hospital for 2 days. Pt ambulates with SPC on the R currently. 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 Pt reports his job is more tedious than strenuous. Pt wife reports difficulty with walking in diagonal patterns.   PAIN:  Are you having pain? Yes: NPRS scale: 2/10  PRECAUTIONS: Fall  WEIGHT BEARING RESTRICTIONS: No  FALLS: Has patient fallen in last 6 months? Yes. 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    PATIENT GOALS: go back to work, see pertinent history and subjective   OBJECTIVE: (objective measures completed at initial evaluation unless  otherwise dated)   TODAY'S TREATMENT:                                                                                                 NMR:  -AMB to cafeteria and all around hospital  area including 3 floors using elevator and walking with SPC, CGA with use of gait belt- focusing on attention to left side and visual scanning.  Utilized hospital signs above doors/wall and patient with approx 75 % accuracy calling out items but did exhibit a few moments of left side neglect.   -STS from chair 2 sets of 10 reps without UE support -Dynamic high knee marching x 25 reps each LE without UE Support.  -Sidestepping up and  then over orange hurdle x 10 reps ea side with UE support       PATIENT EDUCATION: Education details: POC Person educated: Patient Education method: Explanation Education comprehension: verbalized understanding  HOME EXERCISE PROGRAM: Access Code: [email or web address] URL: [email or web address] Date: 09/19/2022 Prepared by: Thresa Ross  Exercises - Standing March with Counter Support  - 1 x daily - 7 x weekly - 2 sets - 25 reps - Side Stepping with Counter Support  - 1 x daily - 7 x weekly - 2 sets - 12 reps - standing in split stance with vertical head nods   - 1 x daily - 7 x weekly - 2 sets - 15 reps - Walking with Head Rotation  - 1 x daily - 7 x weekly  GOALS: Goals reviewed with patient? Yes  SHORT TERM GOALS: Target date: 10/16/2022  Patient will be independent in home exercise program to improve strength/mobility for better functional independence with ADLs. Baseline: No HEP currently  Goal status: INITIAL   LONG TERM GOALS: Target date: 12/11/2022  1.  Patient will increase FOTO score to equal to or greater than  73   to demonstrate statistically significant improvement in mobility and quality of life.  Baseline: 63 10/15/22: 72 Goal status: INITIAL   2.  Patient will increase Dynamic gait index Balance score by > 8 points to demonstrate  decreased fall risk during functional activities. Baseline: 11 10/15/22: 10 Goal status: INITIAL   3.  Patient will increase 10 meter walk test to >1.83m/s as to improve gait speed for better community ambulation and to reduce fall risk. Baseline: 0.84 m/s 10/15/22: 0.81 m/s Goal status: INITIAL  4.  Patient will increase six minute walk test distance to >1000 for progression to community ambulator and improve gait ability Baseline: 865' 10/15/22: 881' Goal status: INITIAL   ASSESSMENT:  CLINICAL IMPRESSION: Pt presents with good participation with continued work on functional mobility. He exhibited less overall neglect and no LOB with walking throughout level surfaces inside hospital today.  Will continue to focus on this progression In subsequent visits.  Pt will continue to benefit from skilled therapy to address remaining deficits in order to improve overall quality of life and return to PLOF.      OBJECTIVE IMPAIRMENTS: Abnormal gait, decreased activity tolerance, decreased balance, decreased endurance, decreased mobility, difficulty walking, and decreased strength.   ACTIVITY LIMITATIONS: carrying, lifting, standing, stairs, and locomotion level  PARTICIPATION LIMITATIONS: driving, shopping, community activity, occupation, and yard work  PERSONAL FACTORS: Age, Fitness, and 3+ comorbidities: HTN, DM, farmer's lung, HLD  are also affecting patient's functional outcome.   REHAB POTENTIAL: Good  CLINICAL DECISION MAKING: Evolving/moderate complexity  EVALUATION COMPLEXITY: Moderate  PLAN:  PT FREQUENCY: 2x/week  PT DURATION: 12 weeks  PLANNED INTERVENTIONS: Therapeutic exercises, Therapeutic activity, Neuromuscular re-education, Balance training, Gait training, Patient/Family education, Self Care, Joint mobilization, Stair training, and Manual therapy  PLAN FOR NEXT SESSION:   Ensure pt has cognitive ability to plug things in in high stress environment, obstacle course  navigating around objects ( more for vision than for balance so turns and tight space navigation) side stepping to left over various obstacles/ on obstacles  but making sure to familiarize patient with obstacles prior so he can count and accommodate for visual deficits. Continue with blaze pod activity for visual scanning and attention to left side.     Lenda Kelp PT  Physical Therapist- [facility]    11/10/22, 8:52 PM

## 2022-11-12 ENCOUNTER — Ambulatory Visit: Payer: Medicare HMO

## 2022-11-12 DIAGNOSIS — R2689 Other abnormalities of gait and mobility: Secondary | ICD-10-CM | POA: Diagnosis not present

## 2022-11-12 DIAGNOSIS — R262 Difficulty in walking, not elsewhere classified: Secondary | ICD-10-CM | POA: Diagnosis not present

## 2022-11-12 DIAGNOSIS — R2681 Unsteadiness on feet: Secondary | ICD-10-CM

## 2022-11-12 DIAGNOSIS — M6281 Muscle weakness (generalized): Secondary | ICD-10-CM | POA: Diagnosis not present

## 2022-11-12 DIAGNOSIS — R269 Unspecified abnormalities of gait and mobility: Secondary | ICD-10-CM | POA: Diagnosis not present

## 2022-11-12 NOTE — Therapy (Signed)
OUTPATIENT PHYSICAL THERAPY NEURO TREATMENT   Patient Name: Dennis Wise MRN: [id number] DOB:1950/12/27, 72 y.o., male Today's Date: 11/12/2022  PCP: [name], MD  REFERRING PROVIDER: [name], MD   END OF SESSION:  PT End of Session - 11/12/22 1058     Visit Number 17    Number of Visits 24    Date for PT Re-Evaluation 12/11/22    Authorization Type [employer] HMO    Progress Note Due on Visit 20    PT Start Time 1100    PT Stop Time 1144    PT Time Calculation (min) 44 min    Equipment Utilized During Treatment Gait belt    Activity Tolerance Patient tolerated treatment well    Behavior During Therapy WFL for tasks assessed/performed                   Past Medical History:  Diagnosis Date   Anemia    Iron deficiency part of this   Cataract    bilateral lens implants   Diabetes mellitus    type II   [name] (HCC)    GERD (gastroesophageal reflux disease) 01/1989   Helicobacter pylori gastritis 06/11/2011   On EGD 05/2011    Hyperlipidemia    Hypertension 11/1999   Internal hemorrhoids    Iron deficiency anemia, unspecified 03/04/2011   MGUS (monoclonal gammopathy of unknown significance)    possible dx initially, had negative f/u   Pancytopenia, acquired (HCC)    Personal history of colonic adenomas 06/05/2012   Pneumonia    Stroke [facility])    Past Surgical History:  Procedure Laterality Date   CATARACT EXTRACTION Bilateral    COLONOSCOPY     ESOPHAGOGASTRODUODENOSCOPY     2012 H. pylori gastritis   FLEXIBLE SIGMOIDOSCOPY  05/1988   internal hemorrhoids   KNEE SURGERY  12/2002   lt knee fx repair ORIF   TIBIA FRACTURE SURGERY     left 2012   Patient Active Problem List   Diagnosis Date Noted   History of CVA in adulthood 10/01/2022   Anxiety 10/01/2022   Acute ischemic right PCA stroke (HCC) 09/05/2022   Stroke (HCC) 09/05/2022   Memory change 09/03/2022   Shoulder pain 07/02/2022   Monoclonal B-cell  lymphocytosis 01/15/2022   Weight loss 01/15/2022   Syncope 11/22/2021   Bilateral carotid artery stenosis 09/23/2021   Nocturia 08/18/2021   IDA (iron deficiency anemia) 07/24/2021   Thrombocytopenia (HCC) 05/03/2021   Normocytic anemia 04/03/2021   Unstable ankle 09/12/2020   Back pain 09/12/2020   Dupuytren contracture 03/03/2019   Healthcare maintenance 02/03/2017   Pseudophakia, both eyes 07/17/2016   Combined form of senile cataract of both eyes 05/02/2016   Hearing loss 10/20/2014   Obesity (BMI 30-39.9) 10/16/2013   Advance care planning 10/16/2013   Actinic keratosis 10/16/2013   Personal history of colonic adenomas 06/05/2012   Pancytopenia, acquired (HCC) 05/01/2011   Medicare annual wellness visit, subsequent 10/24/2010   FOOT PAIN, RIGHT 01/08/2010   Diabetic retinopathy (HCC) 03/21/2005   Essential hypertension 11/20/1999   GERD 01/19/1989   DM type 2 with diabetic background retinopathy (HCC) 04/21/1988   HYPERCHOLESTEROLEMIA  (353,TRIG1980) 1990 04/21/1988    ONSET DATE: 07/10/22  REFERRING DIAG: W09.81 (ICD-10-CM) - History of CVA in adulthood   THERAPY DIAG:  Abnormality of gait and mobility  Difficulty in walking, not elsewhere classified  Muscle weakness (generalized)  Unsteadiness on feet  Rationale for Evaluation and Treatment: Rehabilitation  SUBJECTIVE:   SUBJECTIVE STATEMENT: Patient reports some aching in his L shoulder.   Pt accompanied by: significant other wife : [name]   PERTINENT HISTORY:   Per ED visit 09/05/22:   KIAN OTTAVIANO is a 72 y.o. male with medical history significant of IIDM, HTN, HLD, MGUS, chronic thrombocytopenia, sent from PCPs office for stroke workup.  Patient symptoms started March 21st after he sustained a mechanical fall at his shop when he hit his left shoulder and left rib cage.  No fracture or dislocation.  Since then the patient has had episodes of clumsiness and unsteady gait and had another 3 episodes of  fall at home at different times.  Family also reported that the patient appeared to have memory issue after the initial event in March, as patient started to decreased short memory and complained about worsening of his visions.  Denies any numbness weakness of any of the limbs, no choking or cough or swallowing problems.  Today patient went to see PCP who ordered a outpatient MRI which turned positive for acute right PCA stroke and PCP asked patient to come to ED right away.  Family also reported poorly controlled blood pressure and glucose at home recently.  Pt stroke effected his vision and he has difficulty with peripheral vision and depth perception on the left.  From PT eval:  Pt suffered  RC tear on the left side on 07/13/22 and he is being treated for this via workman's compensation. Has been confirmed with MRI. Pt had stroke on 09/05/22 and went to [facility] ER and was in hospital for 2 days. Pt ambulates with SPC on the R currently. 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 Pt reports his job is more tedious than strenuous. Pt wife reports difficulty with walking in diagonal patterns.   PAIN:  Are you having pain? Yes: NPRS scale: 2/10  PRECAUTIONS: Fall  WEIGHT BEARING RESTRICTIONS: No  FALLS: Has patient fallen in last 6 months? Yes. 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    PATIENT GOALS: go back to work, see pertinent history and subjective   OBJECTIVE: (objective measures completed at initial evaluation unless otherwise dated)   TODAY'S TREATMENT:                                                                                                  NMR:  -airex pad 6" step tandem stance with visual scan to left for dal task word game x 10 minutes  -AMB to cafeteria and all around hospital; Utilized hospital signs above doors/wall and patient with approx 75 % accuracy calling out items but did exhibit a few moments of left side neglect.    -  airex pad: visual scan between A and B    Sit to stand, cross body punches 10x; SUE support for sit to stand.    Activity Description: red=right hand/foot, blue=left hand/foot with 3 on floor 3 on table Activity Setting:  The Blaze Pod Random setting was chosen to enhance cognitive processing and agility, providing an unpredictable environment to simulate real-world scenarios, and fostering quick reactions and adaptability.   Number of Pods:  6 Cycles/Sets:  3 Duration (Time or Hit Count):  30 seconds  Patient Stats  Hits:   5,10, 12     PATIENT EDUCATION: Education details: POC Person educated: Patient Education method: Explanation Education comprehension: verbalized understanding  HOME EXERCISE PROGRAM: Access Code: [email or web address] URL: [email or web address] Date: 09/19/2022 Prepared by: Thresa Ross  Exercises - Standing March with Counter Support  - 1 x daily - 7 x weekly - 2 sets - 25 reps - Side Stepping with Counter Support  - 1 x daily - 7 x weekly - 2 sets - 12 reps - standing in split stance with vertical head nods   - 1 x daily - 7 x weekly - 2 sets - 15 reps - Walking with Head Rotation  - 1 x daily - 7 x weekly  GOALS: Goals reviewed with patient? Yes  SHORT TERM GOALS: Target date: 10/16/2022  Patient will be independent in home exercise program to improve strength/mobility for better functional independence with ADLs. Baseline: No HEP currently  Goal status: INITIAL   LONG TERM GOALS: Target date: 12/11/2022  1.  Patient will increase FOTO score to equal to or greater than  73   to demonstrate statistically significant  improvement in mobility and quality of life.  Baseline: 63 10/15/22: 72 Goal status: INITIAL   2.  Patient will increase Dynamic gait index Balance score by > 8 points to demonstrate decreased fall risk during functional activities. Baseline: 11 10/15/22: 10 Goal status: INITIAL   3.  Patient will increase 10 meter walk test to >1.75m/s as to improve gait speed for better community ambulation and to reduce fall risk. Baseline: 0.84 m/s 10/15/22: 0.81 m/s Goal status: INITIAL  4.  Patient will increase six minute walk test distance to >1000 for progression to community ambulator and improve gait ability Baseline: 865' 10/15/22: 881' Goal status: INITIAL   ASSESSMENT:  CLINICAL IMPRESSION: Patient requires multimodal cueing for visual scans with intermittent ability to scan. He is highly motivated and eager to participate in therapy. He does fatigue with sit to stands indicating area of continued focus. Head turns do not result in LOB indicating improved stability.  Pt will continue to benefit from skilled therapy to address remaining deficits in order to improve overall quality of life and return to PLOF.      OBJECTIVE IMPAIRMENTS: Abnormal gait, decreased activity tolerance, decreased balance, decreased endurance, decreased mobility, difficulty walking, and decreased strength.   ACTIVITY LIMITATIONS: carrying, lifting, standing, stairs, and locomotion level  PARTICIPATION LIMITATIONS: driving, shopping, community activity, occupation, and yard work  PERSONAL FACTORS: Age, Fitness, and 3+ comorbidities: HTN, DM, farmer's lung, HLD  are also affecting patient's functional outcome.   REHAB POTENTIAL: Good  CLINICAL DECISION MAKING: Evolving/moderate complexity  EVALUATION COMPLEXITY: Moderate  PLAN:  PT FREQUENCY: 2x/week  PT DURATION: 12 weeks  PLANNED INTERVENTIONS: Therapeutic exercises, Therapeutic activity, Neuromuscular re-education, Balance training, Gait training,  Patient/Family education, Self Care, Joint mobilization, Stair training, and Manual therapy  PLAN FOR NEXT SESSION:   Ensure pt has cognitive ability to  plug things in in high stress environment, obstacle course navigating around objects ( more for vision than for balance so turns and tight space navigation) side stepping to left over various obstacles/ on obstacles but making sure to familiarize patient with obstacles prior so he can count and accommodate for visual deficits. Continue with blaze pod activity for visual scanning and attention to left side.     Precious Bard PT  Physical Therapist- [facility]    11/12/22, 12:44 PM

## 2022-11-17 ENCOUNTER — Ambulatory Visit: Payer: Medicare HMO

## 2022-11-17 DIAGNOSIS — R2681 Unsteadiness on feet: Secondary | ICD-10-CM | POA: Diagnosis not present

## 2022-11-17 DIAGNOSIS — R269 Unspecified abnormalities of gait and mobility: Secondary | ICD-10-CM

## 2022-11-17 DIAGNOSIS — M6281 Muscle weakness (generalized): Secondary | ICD-10-CM | POA: Diagnosis not present

## 2022-11-17 DIAGNOSIS — R2689 Other abnormalities of gait and mobility: Secondary | ICD-10-CM

## 2022-11-17 DIAGNOSIS — R262 Difficulty in walking, not elsewhere classified: Secondary | ICD-10-CM

## 2022-11-17 NOTE — Therapy (Signed)
OUTPATIENT PHYSICAL THERAPY NEURO TREATMENT   Patient Name: Dennis Wise MRN: [id number] DOB:[birth year], 72 y.o., male Today's Date: 11/17/2022  PCP: [name], MD  REFERRING PROVIDER: [name], MD   END OF SESSION:  PT End of Session - 11/17/22 1108     Visit Number 18    Number of Visits 24    Date for PT Re-Evaluation 12/11/22    Authorization Type [employer] HMO    Progress Note Due on Visit 20    PT Start Time 1100    PT Stop Time 1144    PT Time Calculation (min) 44 min    Equipment Utilized During Treatment Gait belt    Activity Tolerance Patient tolerated treatment well    Behavior During Therapy WFL for tasks assessed/performed                    Past Medical History:  Diagnosis Date   Anemia    Iron deficiency part of this   Cataract    bilateral lens implants   Diabetes mellitus    type II   [name] (HCC)    GERD (gastroesophageal reflux disease) 01/1989   Helicobacter pylori gastritis 06/11/2011   On EGD 05/2011    Hyperlipidemia    Hypertension 11/1999   Internal hemorrhoids    Iron deficiency anemia, unspecified 03/04/2011   MGUS (monoclonal gammopathy of unknown significance)    possible dx initially, had negative f/u   Pancytopenia, acquired (HCC)    Personal history of colonic adenomas 06/05/2012   Pneumonia    Stroke [facility])    Past Surgical History:  Procedure Laterality Date   CATARACT EXTRACTION Bilateral    COLONOSCOPY     ESOPHAGOGASTRODUODENOSCOPY     2012 H. pylori gastritis   FLEXIBLE SIGMOIDOSCOPY  05/1988   internal hemorrhoids   KNEE SURGERY  12/2002   lt knee fx repair ORIF   TIBIA FRACTURE SURGERY     left 2012   Patient Active Problem List   Diagnosis Date Noted   History of CVA in adulthood 10/01/2022   Anxiety 10/01/2022   Acute ischemic right PCA stroke (HCC) 09/05/2022   Stroke (HCC) 09/05/2022   Memory change 09/03/2022   Shoulder pain 07/02/2022   Monoclonal B-cell  lymphocytosis 01/15/2022   Weight loss 01/15/2022   Syncope 11/22/2021   Bilateral carotid artery stenosis 09/23/2021   Nocturia 08/18/2021   IDA (iron deficiency anemia) 07/24/2021   Thrombocytopenia (HCC) 05/03/2021   Normocytic anemia 04/03/2021   Unstable ankle 09/12/2020   Back pain 09/12/2020   Dupuytren contracture 03/03/2019   Healthcare maintenance 02/03/2017   Pseudophakia, both eyes 07/17/2016   Combined form of senile cataract of both eyes 05/02/2016   Hearing loss 10/20/2014   Obesity (BMI 30-39.9) 10/16/2013   Advance care planning 10/16/2013   Actinic keratosis 10/16/2013   Personal history of colonic adenomas 06/05/2012   Pancytopenia, acquired (HCC) 05/01/2011   Medicare annual wellness visit, subsequent 10/24/2010   FOOT PAIN, RIGHT 01/08/2010   Diabetic retinopathy (HCC) 03/21/2005   Essential hypertension 11/20/1999   GERD 01/19/1989   DM type 2 with diabetic background retinopathy (HCC) 04/21/1988   HYPERCHOLESTEROLEMIA  (353,TRIG1980) 1990 04/21/1988    ONSET DATE: 07/10/22  REFERRING DIAG: W09.81 (ICD-10-CM) - History of CVA in adulthood   THERAPY DIAG:  Abnormality of gait and mobility  Difficulty in walking, not elsewhere classified  Muscle weakness (generalized)  Unsteadiness on feet  Other abnormalities of gait and  mobility  Rationale for Evaluation and Treatment: Rehabilitation    SUBJECTIVE:   SUBJECTIVE STATEMENT: Patient reports his wife let him mow (riding) and he reports he did well and no issues- stating he did not miss any patches of grass or bump into anything.   Pt accompanied by: significant other wife : [name]   PERTINENT HISTORY:   Per ED visit 09/05/22:   CHOICE MOUDRY is a 72 y.o. male with medical history significant of IIDM, HTN, HLD, MGUS, chronic thrombocytopenia, sent from PCPs office for stroke workup.  Patient symptoms started March 21st after he sustained a mechanical fall at his shop when he hit his left shoulder  and left rib cage.  No fracture or dislocation.  Since then the patient has had episodes of clumsiness and unsteady gait and had another 3 episodes of fall at home at different times.  Family also reported that the patient appeared to have memory issue after the initial event in March, as patient started to decreased short memory and complained about worsening of his visions.  Denies any numbness weakness of any of the limbs, no choking or cough or swallowing problems.  Today patient went to see PCP who ordered a outpatient MRI which turned positive for acute right PCA stroke and PCP asked patient to come to ED right away.  Family also reported poorly controlled blood pressure and glucose at home recently.  Pt stroke effected his vision and he has difficulty with peripheral vision and depth perception on the left.  From PT eval:  Pt suffered  RC tear on the left side on 07/13/22 and he is being treated for this via workman's compensation. Has been confirmed with MRI. Pt had stroke on 09/05/22 and went to [facility] ER and was in hospital for 2 days. Pt ambulates with SPC on the R currently. 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 Pt reports his job is more tedious than strenuous. Pt wife reports difficulty with walking in diagonal patterns.   PAIN:  Are you having pain? Yes: NPRS scale: 2/10  PRECAUTIONS: Fall  WEIGHT BEARING RESTRICTIONS: No  FALLS: Has patient fallen in last 6 months? Yes. 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    PATIENT GOALS: go back to work, see pertinent history and subjective   OBJECTIVE: (objective  measures completed at initial evaluation unless otherwise dated)   TODAY'S TREATMENT:                                                                                                 NMR:   Sit to stand at dry erase board/support bar- with dynamic reaching (diagonal) to left then side step to right  then back to chair x 10 reps  Side step squat- with dual task of calling out numbers ( one person on each side -holding up a number-1-5) x 15 reps  Gait x 50 feet with horizontal head turns (one person on each side walking along side holding up hand with numbers-1-5) x 4 trials- Focusing on visual scanning  Dual task- word game- Alphabet with visual scanning- placing magnet letters in order on dry erase board with narrowed then semi-tandem foot position. He initially had some difficulty with left side attention- but if provided increased time- he would eventually scan to most lateral portion of board on left side to identify necessary letters.         PATIENT EDUCATION: Education details: POC Person educated: Patient Education method: Explanation Education comprehension: verbalized understanding  HOME EXERCISE PROGRAM: Access Code: [email or web address] URL: [email or web address] Date: 09/19/2022 Prepared by: Thresa Ross  Exercises - Standing March with Counter Support  - 1 x daily - 7 x weekly - 2 sets - 25 reps - Side Stepping with Counter Support  - 1 x daily - 7 x weekly - 2 sets - 12 reps - standing in split stance with vertical head nods   - 1 x daily - 7 x weekly - 2 sets - 15 reps - Walking with Head Rotation  - 1 x daily - 7 x weekly  GOALS: Goals reviewed with patient? Yes  SHORT TERM GOALS: Target date: 10/16/2022  Patient will be independent in home exercise program to improve strength/mobility for better functional independence with ADLs. Baseline: No HEP currently  Goal status: INITIAL   LONG TERM GOALS: Target date: 12/11/2022  1.  Patient will increase  FOTO score to equal to or greater than  73   to demonstrate statistically significant improvement in mobility and quality of life.  Baseline: 63 10/15/22: 72 Goal status: INITIAL   2.  Patient will increase Dynamic gait index Balance score by > 8 points to demonstrate decreased fall risk during functional activities. Baseline: 11 10/15/22: 10 Goal status: INITIAL   3.  Patient will increase 10 meter walk test to >1.35m/s as to improve gait speed for better community ambulation and to reduce fall risk. Baseline: 0.84 m/s 10/15/22: 0.81 m/s Goal status: INITIAL  4.  Patient will increase six minute walk test distance to >1000 for progression to community ambulator and improve gait ability Baseline: 865' 10/15/22: 881' Goal status: INITIAL   ASSESSMENT:  CLINICAL IMPRESSION: Patient required less overall verbal cueing for visual scans particularly to left side today. He also experienced some gait instability with deviation from midline with horizontal head turn walking but overall progressing- Able to improve attention to left side with less verbal cues overall.   Pt will continue to benefit from skilled therapy to address remaining deficits in order to improve overall quality of life and return to PLOF.      OBJECTIVE IMPAIRMENTS: Abnormal gait, decreased activity tolerance, decreased balance, decreased endurance, decreased mobility, difficulty walking, and decreased strength.   ACTIVITY LIMITATIONS: carrying, lifting, standing, stairs, and locomotion level  PARTICIPATION LIMITATIONS: driving, shopping, community activity, occupation, and yard work  PERSONAL FACTORS: Age, Fitness, and 3+ comorbidities: HTN, DM, farmer's lung, HLD  are also affecting patient's functional outcome.   REHAB POTENTIAL: Good  CLINICAL DECISION MAKING: Evolving/moderate complexity  EVALUATION COMPLEXITY: Moderate  PLAN:  PT FREQUENCY: 2x/week  PT DURATION: 12 weeks  PLANNED INTERVENTIONS:  Therapeutic exercises, Therapeutic activity, Neuromuscular re-education, Balance training, Gait training, Patient/Family  education, Self Care, Joint mobilization, Stair training, and Manual therapy  PLAN FOR NEXT SESSION:   Ensure pt has cognitive ability to plug things in in high stress environment, obstacle course navigating around objects ( more for vision than for balance so turns and tight space navigation) side stepping to left over various obstacles/ on obstacles but making sure to familiarize patient with obstacles prior so he can count and accommodate for visual deficits. Continue with blaze pod activity for visual scanning and attention to left side.     Lenda Kelp PT  Physical Therapist- [facility]    11/17/22, 4:07 PM

## 2022-11-19 ENCOUNTER — Encounter: Payer: Self-pay | Admitting: Physical Therapy

## 2022-11-19 ENCOUNTER — Ambulatory Visit: Payer: Medicare HMO | Admitting: Physical Therapy

## 2022-11-19 DIAGNOSIS — M6281 Muscle weakness (generalized): Secondary | ICD-10-CM | POA: Diagnosis not present

## 2022-11-19 DIAGNOSIS — R269 Unspecified abnormalities of gait and mobility: Secondary | ICD-10-CM

## 2022-11-19 DIAGNOSIS — R2689 Other abnormalities of gait and mobility: Secondary | ICD-10-CM

## 2022-11-19 DIAGNOSIS — R262 Difficulty in walking, not elsewhere classified: Secondary | ICD-10-CM

## 2022-11-19 DIAGNOSIS — R2681 Unsteadiness on feet: Secondary | ICD-10-CM | POA: Diagnosis not present

## 2022-11-19 NOTE — Therapy (Signed)
OUTPATIENT PHYSICAL THERAPY NEURO TREATMENT   Patient Name: [name] DOB:30-Jun-1950, 72 y.o., male Today's Date: 11/19/2022  PCP: Dennis Nam, MD  REFERRING PROVIDER: [name], MD   END OF SESSION:  PT End of Session - 11/19/22 0955     Visit Number 19    Number of Visits 24    Date for PT Re-Evaluation 12/11/22    Authorization Type [employer] HMO    Progress Note Due on Visit 20    PT Start Time 0930    PT Stop Time 1014    PT Time Calculation (min) 44 min    Equipment Utilized During Treatment Gait belt    Activity Tolerance Patient tolerated treatment well    Behavior During Therapy WFL for tasks assessed/performed                     Past Medical History:  Diagnosis Date   Anemia    Iron deficiency part of this   Cataract    bilateral lens implants   Diabetes mellitus    type II   [name] (HCC)    GERD (gastroesophageal reflux disease) 01/1989   Helicobacter pylori gastritis 06/11/2011   On EGD 05/2011    Hyperlipidemia    Hypertension 11/1999   Internal hemorrhoids    Iron deficiency anemia, unspecified 03/04/2011   MGUS (monoclonal gammopathy of unknown significance)    possible dx initially, had negative f/u   Pancytopenia, acquired (HCC)    Personal history of colonic adenomas 06/05/2012   Pneumonia    Stroke [facility])    Past Surgical History:  Procedure Laterality Date   CATARACT EXTRACTION Bilateral    COLONOSCOPY     ESOPHAGOGASTRODUODENOSCOPY     2012 H. pylori gastritis   FLEXIBLE SIGMOIDOSCOPY  05/1988   internal hemorrhoids   KNEE SURGERY  12/2002   lt knee fx repair ORIF   TIBIA FRACTURE SURGERY     left 2012   Patient Active Problem List   Diagnosis Date Noted   History of CVA in adulthood 10/01/2022   Anxiety 10/01/2022   Acute ischemic right PCA stroke (HCC) 09/05/2022   Stroke (HCC) 09/05/2022   Memory change 09/03/2022   Shoulder pain 07/02/2022   Monoclonal B-cell  lymphocytosis 01/15/2022   Weight loss 01/15/2022   Syncope 11/22/2021   Bilateral carotid artery stenosis 09/23/2021   Nocturia 08/18/2021   IDA (iron deficiency anemia) 07/24/2021   Thrombocytopenia (HCC) 05/03/2021   Normocytic anemia 04/03/2021   Unstable ankle 09/12/2020   Back pain 09/12/2020   Dupuytren contracture 03/03/2019   Healthcare maintenance 02/03/2017   Pseudophakia, both eyes 07/17/2016   Combined form of senile cataract of both eyes 05/02/2016   Hearing loss 10/20/2014   Obesity (BMI 30-39.9) 10/16/2013   Advance care planning 10/16/2013   Actinic keratosis 10/16/2013   Personal history of colonic adenomas 06/05/2012   Pancytopenia, acquired (HCC) 05/01/2011   Medicare annual wellness visit, subsequent 10/24/2010   FOOT PAIN, RIGHT 01/08/2010   Diabetic retinopathy (HCC) 03/21/2005   Essential hypertension 11/20/1999   GERD 01/19/1989   DM type 2 with diabetic background retinopathy (HCC) 04/21/1988   HYPERCHOLESTEROLEMIA  (353,TRIG1980) 1990 04/21/1988    ONSET DATE: 07/10/22  REFERRING DIAG: E95.28 (ICD-10-CM) - History of CVA in adulthood   THERAPY DIAG:  Abnormality of gait and mobility  Difficulty in walking, not elsewhere classified  Unsteadiness on feet  Other abnormalities of gait and mobility  Muscle  weakness (generalized)  Rationale for Evaluation and Treatment: Rehabilitation    SUBJECTIVE:   SUBJECTIVE STATEMENT:  Pt reports doing well today. Pt denies any recent falls/stumbles since prior session. Pt denies any updates to medications or medical appointment since prior session. Pt able to navigate grocery store without significant difficulty.     Pt accompanied by: significant other wife : Dennis Wise   PERTINENT HISTORY:   Per ED visit 09/05/22:   Dennis Wise is a 72 y.o. male with medical history significant of IIDM, HTN, HLD, MGUS, chronic thrombocytopenia, sent from PCPs office for stroke workup.  Patient symptoms started March  21st after he sustained a mechanical fall at his shop when he hit his left shoulder and left rib cage.  No fracture or dislocation.  Since then the patient has had episodes of clumsiness and unsteady gait and had another 3 episodes of fall at home at different times.  Family also reported that the patient appeared to have memory issue after the initial event in March, as patient started to decreased short memory and complained about worsening of his visions.  Denies any numbness weakness of any of the limbs, no choking or cough or swallowing problems.  Today patient went to see PCP who ordered a outpatient MRI which turned positive for acute right PCA stroke and PCP asked patient to come to ED right away.  Family also reported poorly controlled blood pressure and glucose at home recently.  Pt stroke effected his vision and he has difficulty with peripheral vision and depth perception on the left.  From PT eval:  Pt suffered  RC tear on the left side on 07/13/22 and he is being treated for this via workman's compensation. Has been confirmed with MRI. Pt had stroke on 09/05/22 and went to [facility] ER and was in hospital for 2 days. Pt ambulates with SPC on the R currently. 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 Pt reports his job is more tedious than strenuous. Pt wife reports difficulty with walking in diagonal patterns.   PAIN:  Are you having pain? Yes: NPRS scale: 2/10  PRECAUTIONS: Fall  WEIGHT BEARING RESTRICTIONS: No  FALLS: Has patient fallen in last 6 months? Yes. 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    PATIENT GOALS: go  back to work, see pertinent history and subjective   OBJECTIVE: (objective measures completed at initial evaluation unless otherwise dated)   TODAY'S TREATMENT:                                                                                                 NMR:   Ambulation with 3# AW in hospital with attention to turns and  having pt recall path that was taken back to treatment area   Side stepping to left on and over various objects x 4 lengths of // bars   Dual task balanc eand left sided attention game x 10 minutes playing I spy with pt searching for objects by shape selected by PT. PT challenged pt by choosing objects on the left side of visual field only. 1 LOB anterior corrected with UE assist and pt guarding.         PATIENT EDUCATION: Education details: POC Person educated: Patient Education method: Explanation Education comprehension: verbalized understanding  HOME EXERCISE PROGRAM: Access Code: [email or web address] URL: [email or web address] Date: 09/19/2022 Prepared by: Thresa Ross  Exercises - Standing March with Counter Support  - 1 x daily - 7 x weekly - 2 sets - 25 reps - Side Stepping with Counter Support  - 1 x daily - 7 x weekly - 2 sets - 12 reps - standing in split stance with vertical head nods   - 1 x daily - 7 x weekly - 2 sets - 15 reps - Walking with Head Rotation  - 1 x daily - 7 x weekly  GOALS: Goals reviewed with patient? Yes  SHORT TERM GOALS: Target date: 10/16/2022  Patient will be independent in home exercise program to improve strength/mobility for better functional independence with ADLs. Baseline: No HEP currently  Goal status: INITIAL   LONG TERM GOALS: Target date: 12/11/2022  1.  Patient will increase FOTO score to equal to or greater than  73   to demonstrate statistically significant improvement in mobility and quality of life.  Baseline: 63 10/15/22: 72 Goal status: INITIAL   2.  Patient will increase Dynamic gait  index Balance score by > 8 points to demonstrate decreased fall risk during functional activities. Baseline: 11 10/15/22: 10 Goal status: INITIAL   3.  Patient will increase 10 meter walk test to >1.52m/s as to improve gait speed for better community ambulation and to reduce fall risk. Baseline: 0.84 m/s 10/15/22: 0.81 m/s Goal status: INITIAL  4.  Patient will increase six minute walk test distance to >1000 for progression to community ambulator and improve gait ability Baseline: 865' 10/15/22: 881' Goal status: INITIAL   ASSESSMENT:  CLINICAL IMPRESSION: Patient required less overall verbal cueing for visual scans particularly to left side today. Pt shows improved recall with navigation of hallways and outside with good recall of objects on the left side. Pt also showing good balance with stepping to the left on and over various objects.  Pt will continue to benefit from skilled therapy to address remaining deficits in order to improve overall quality of life and return to PLOF.      OBJECTIVE IMPAIRMENTS: Abnormal gait, decreased activity tolerance, decreased balance, decreased endurance, decreased mobility, difficulty walking, and decreased strength.   ACTIVITY LIMITATIONS: carrying, lifting, standing, stairs, and locomotion level  PARTICIPATION LIMITATIONS: driving, shopping, community activity, occupation, and yard work  PERSONAL FACTORS: Age, Fitness, and 3+ comorbidities: HTN, DM, farmer's lung, HLD  are also affecting patient's functional outcome.   REHAB POTENTIAL: Good  CLINICAL DECISION MAKING: Evolving/moderate complexity  EVALUATION COMPLEXITY: Moderate  PLAN:  PT FREQUENCY: 2x/week  PT DURATION: 12 weeks  PLANNED INTERVENTIONS: Therapeutic exercises, Therapeutic activity, Neuromuscular re-education, Balance training, Gait training, Patient/Family education, Self Care, Joint mobilization, Stair training, and Manual therapy  PLAN FOR NEXT SESSION:   Ensure pt  has cognitive ability to plug things in in high stress environment, obstacle course navigating around  objects ( more for vision than for balance so turns and tight space navigation) side stepping to left over various obstacles/ on obstacles but making sure to familiarize patient with obstacles prior so he can count and accommodate for visual deficits. Continue with blaze pod activity for visual scanning and attention to left side.     Norman Herrlich PT  Physical Therapist- [facility]    11/19/22, 10:23 AM

## 2022-11-24 ENCOUNTER — Ambulatory Visit: Payer: Medicare HMO | Attending: Family Medicine | Admitting: Physical Therapy

## 2022-11-24 DIAGNOSIS — R269 Unspecified abnormalities of gait and mobility: Secondary | ICD-10-CM | POA: Insufficient documentation

## 2022-11-24 DIAGNOSIS — R2681 Unsteadiness on feet: Secondary | ICD-10-CM | POA: Insufficient documentation

## 2022-11-24 DIAGNOSIS — R2689 Other abnormalities of gait and mobility: Secondary | ICD-10-CM | POA: Diagnosis not present

## 2022-11-24 DIAGNOSIS — R262 Difficulty in walking, not elsewhere classified: Secondary | ICD-10-CM | POA: Diagnosis not present

## 2022-11-24 NOTE — Therapy (Signed)
OUTPATIENT PHYSICAL THERAPY NEURO TREATMENT/ Physical Therapy Progress Note/ Discharge Therapy    Dates of reporting period  10/27/22   to   11/24/22    Patient Name: Dennis Wise MRN: [id number] DOB:Aug 23, 1950, 72 y.o., male Today's Date: 11/24/2022  PCP: [name], MD  REFERRING PROVIDER: [name], MD   END OF SESSION:  PT End of Session - 11/24/22 1050     Visit Number 20    Number of Visits 24    Date for PT Re-Evaluation 12/11/22    Authorization Type [employer] HMO    Progress Note Due on Visit 30    PT Start Time 1100    PT Stop Time 1143    PT Time Calculation (min) 43 min    Equipment Utilized During Treatment Gait belt    Activity Tolerance Patient tolerated treatment well    Behavior During Therapy WFL for tasks assessed/performed                     Past Medical History:  Diagnosis Date   Anemia    Iron deficiency part of this   Cataract    bilateral lens implants   Diabetes mellitus    type II   [name] (HCC)    GERD (gastroesophageal reflux disease) 01/1989   Helicobacter pylori gastritis 06/11/2011   On EGD 05/2011    Hyperlipidemia    Hypertension 11/1999   Internal hemorrhoids    Iron deficiency anemia, unspecified 03/04/2011   MGUS (monoclonal gammopathy of unknown significance)    possible dx initially, had negative f/u   Pancytopenia, acquired (HCC)    Personal history of colonic adenomas 06/05/2012   Pneumonia    Stroke [facility])    Past Surgical History:  Procedure Laterality Date   CATARACT EXTRACTION Bilateral    COLONOSCOPY     ESOPHAGOGASTRODUODENOSCOPY     2012 H. pylori gastritis   FLEXIBLE SIGMOIDOSCOPY  05/1988   internal hemorrhoids   KNEE SURGERY  12/2002   lt knee fx repair ORIF   TIBIA FRACTURE SURGERY     left 2012   Patient Active Problem List   Diagnosis Date Noted   History of CVA in adulthood 10/01/2022   Anxiety 10/01/2022   Acute ischemic right PCA stroke (HCC) 09/05/2022    Stroke (HCC) 09/05/2022   Memory change 09/03/2022   Shoulder pain 07/02/2022   Monoclonal B-cell lymphocytosis 01/15/2022   Weight loss 01/15/2022   Syncope 11/22/2021   Bilateral carotid artery stenosis 09/23/2021   Nocturia 08/18/2021   IDA (iron deficiency anemia) 07/24/2021   Thrombocytopenia (HCC) 05/03/2021   Normocytic anemia 04/03/2021   Unstable ankle 09/12/2020   Back pain 09/12/2020   Dupuytren contracture 03/03/2019   Healthcare maintenance 02/03/2017   Pseudophakia, both eyes 07/17/2016   Combined form of senile cataract of both eyes 05/02/2016   Hearing loss 10/20/2014   Obesity (BMI 30-39.9) 10/16/2013   Advance care planning 10/16/2013   Actinic keratosis 10/16/2013   Personal history of colonic adenomas 06/05/2012   Pancytopenia, acquired (HCC) 05/01/2011   Medicare annual wellness visit, subsequent 10/24/2010   FOOT PAIN, RIGHT 01/08/2010   Diabetic retinopathy (HCC) 03/21/2005   Essential hypertension 11/20/1999   GERD 01/19/1989   DM type 2 with diabetic background retinopathy (HCC) 04/21/1988   HYPERCHOLESTEROLEMIA  ([id number]) 1990 04/21/1988    ONSET DATE: 07/10/22  REFERRING DIAG: N82.95 (ICD-10-CM) - History of CVA in adulthood   THERAPY DIAG:  Abnormality of gait  and mobility  Difficulty in walking, not elsewhere classified  Unsteadiness on feet  Other abnormalities of gait and mobility  Rationale for Evaluation and Treatment: Rehabilitation    SUBJECTIVE:   SUBJECTIVE STATEMENT:  Pt reports doing well today. Pt able to mow with tractor bush hog without error. Pt reports he has a surgery in a few weeks for his shoulder and will be going to rehab for that when the time comes.     Pt accompanied by: significant other wife : [name]   PERTINENT HISTORY:   Per ED visit 09/05/22:   [name] is a 72 y.o. male with medical history significant of IIDM, HTN, HLD, MGUS, chronic thrombocytopenia, sent from PCPs office for stroke workup.   Patient symptoms started March 21st after he sustained a mechanical fall at his shop when he hit his left shoulder and left rib cage.  No fracture or dislocation.  Since then the patient has had episodes of clumsiness and unsteady gait and had another 3 episodes of fall at home at different times.  Family also reported that the patient appeared to have memory issue after the initial event in March, as patient started to decreased short memory and complained about worsening of his visions.  Denies any numbness weakness of any of the limbs, no choking or cough or swallowing problems.  Today patient went to see PCP who ordered a outpatient MRI which turned positive for acute right PCA stroke and PCP asked patient to come to ED right away.  Family also reported poorly controlled blood pressure and glucose at home recently.  Pt stroke effected his vision and he has difficulty with peripheral vision and depth perception on the left.  From PT eval:  Pt suffered  RC tear on the left side on 07/13/22 and he is being treated for this via workman's compensation. Has been confirmed with MRI. Pt had stroke on 09/05/22 and went to [facility] ER and was in hospital for 2 days. Pt ambulates with SPC on the R currently. 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 Pt reports his job is more tedious than strenuous. Pt wife reports difficulty with walking in diagonal patterns.   PAIN:  Are you having pain? Yes: NPRS scale: 2/10  PRECAUTIONS: Fall  WEIGHT BEARING RESTRICTIONS: No  FALLS: Has patient fallen in last 6 months? Yes. Number of falls 3.24 when RC injury happened, fell feeding cows when he got knocked over, fell walking in parking garage over parking lot object, and fell again leaning over  trailer.    PATIENT GOALS: go back to work, see pertinent history and subjective   OBJECTIVE: (objective measures completed at initial evaluation unless otherwise dated)   TODAY'S TREATMENT:        Physical therapy treatment session today consisted of completing assessment of goals and administration of testing as demonstrated and documented in flow sheet, treatment, and goals section of this note. Addition treatments may be found below.    [facility] PT Assessment - 11/24/22 0001       Dynamic Gait Index   Level Surface Normal    Change in Gait Speed Normal    Gait with Horizontal Head Turns Mild Impairment  Gait with Vertical Head Turns Mild Impairment    Gait and Pivot Turn Normal    Step Over Obstacle Mild Impairment    Step Around Obstacles Mild Impairment    Steps Mild Impairment    Total Score 19                   PATIENT EDUCATION: Education details: POC Person educated: Patient Education method: Explanation Education comprehension: verbalized understanding  HOME EXERCISE PROGRAM: Access Code: [email or web address] URL: [email or web address] Date: 09/19/2022 Prepared by: Thresa Ross  Exercises - Standing March with Counter Support  - 1 x daily - 7 x weekly - 2 sets - 25 reps - Side Stepping with Counter Support  - 1 x daily - 7 x weekly - 2 sets - 12 reps - standing in split stance with vertical head nods   - 1 x daily - 7 x weekly - 2 sets - 15 reps - Walking with Head Rotation  - 1 x daily - 7 x weekly  GOALS: Goals reviewed with patient? Yes  SHORT TERM GOALS: Target date: 10/16/2022  Patient will be independent in home exercise program to improve strength/mobility for better functional independence with ADLs. Baseline: No HEP currently 8/5: completing HEP plus other brain games to improve attention to the left side  Goal status: INITIAL   LONG TERM GOALS: Target date: 12/11/2022  1.  Patient will increase FOTO score to equal to or greater  than  73   to demonstrate statistically significant improvement in mobility and quality of life.  Baseline: 63 10/15/22: 72 11/24/22 90 Goal status: MET   2.  Patient will increase Dynamic gait index Balance score by > 8 points to demonstrate decreased fall risk during functional activities. Baseline: 11 10/15/22: 10 11/24/22: 19 Goal status: MET   3.  Patient will increase 10 meter walk test to >1.43m/s as to improve gait speed for better community ambulation and to reduce fall risk. Baseline: 0.84 m/s 10/15/22: 0.81 m/s 8/5: 1.05 m/s Goal status: MET  4.  Patient will increase six minute walk test distance to >1000 for progression to community ambulator and improve gait ability Baseline: 865' 10/15/22: 881' 11/24/22: 1125 ft  Goal status: MET   ASSESSMENT:  CLINICAL IMPRESSION: Patient presents to physical therapy for progress note this date.  All goals assessment patient has met all of his long-term goals and short-term goals at this time.  Patient's 10 m walk test and gait speed improved significantly as well as a 6-minute walk test initial evaluation.  Patient also shows great progress with his dynamic gait index showing significant decrease in his risk for falls compared to initial evaluation.  Based on patient's test results this session physical therapy treatment in this setting is no longer indicated and patient will be discharged from physical therapy at this time.  Patient and caregiver are both comfortable with this plan and are happy with patient's progress to this point.  Questions answered and of session.    OBJECTIVE IMPAIRMENTS: Abnormal gait, decreased activity tolerance, decreased balance, decreased endurance, decreased mobility, difficulty walking, and decreased strength.   ACTIVITY LIMITATIONS: carrying, lifting, standing, stairs, and locomotion level  PARTICIPATION LIMITATIONS: driving, shopping, community activity, occupation, and yard work  PERSONAL FACTORS: Age,  Fitness, and 3+ comorbidities: HTN, DM, farmer's lung, HLD  are also affecting patient's functional outcome.   REHAB POTENTIAL: Good  CLINICAL DECISION MAKING: Evolving/moderate complexity  EVALUATION COMPLEXITY: Moderate  PLAN:  PT FREQUENCY: 2x/week  PT DURATION:  12 weeks  PLANNED INTERVENTIONS: Therapeutic exercises, Therapeutic activity, Neuromuscular re-education, Balance training, Gait training, Patient/Family education, Self Care, Joint mobilization, Stair training, and Manual therapy  PLAN FOR NEXT SESSION:   D/C this session     Norman Herrlich PT  Physical Therapist- [facility]  [facility]    11/24/22, 11:10 AM

## 2022-11-26 ENCOUNTER — Ambulatory Visit: Payer: Medicare HMO | Admitting: Physical Therapy

## 2022-11-28 DIAGNOSIS — H53462 Homonymous bilateral field defects, left side: Secondary | ICD-10-CM | POA: Diagnosis not present

## 2022-11-28 DIAGNOSIS — H40003 Preglaucoma, unspecified, bilateral: Secondary | ICD-10-CM | POA: Diagnosis not present

## 2022-11-28 DIAGNOSIS — H02831 Dermatochalasis of right upper eyelid: Secondary | ICD-10-CM | POA: Diagnosis not present

## 2022-11-28 DIAGNOSIS — Z7984 Long term (current) use of oral hypoglycemic drugs: Secondary | ICD-10-CM | POA: Diagnosis not present

## 2022-11-28 DIAGNOSIS — Z961 Presence of intraocular lens: Secondary | ICD-10-CM | POA: Diagnosis not present

## 2022-11-28 DIAGNOSIS — I69398 Other sequelae of cerebral infarction: Secondary | ICD-10-CM | POA: Diagnosis not present

## 2022-11-28 DIAGNOSIS — H02834 Dermatochalasis of left upper eyelid: Secondary | ICD-10-CM | POA: Diagnosis not present

## 2022-11-28 DIAGNOSIS — E113513 Type 2 diabetes mellitus with proliferative diabetic retinopathy with macular edema, bilateral: Secondary | ICD-10-CM | POA: Diagnosis not present

## 2022-12-01 ENCOUNTER — Ambulatory Visit: Payer: Medicare HMO | Admitting: Physical Therapy

## 2022-12-04 ENCOUNTER — Encounter (INDEPENDENT_AMBULATORY_CARE_PROVIDER_SITE_OTHER): Payer: Self-pay

## 2022-12-05 ENCOUNTER — Ambulatory Visit: Payer: Medicare HMO | Admitting: Physical Therapy

## 2022-12-08 ENCOUNTER — Ambulatory Visit: Payer: Medicare HMO

## 2022-12-09 DIAGNOSIS — Z8673 Personal history of transient ischemic attack (TIA), and cerebral infarction without residual deficits: Secondary | ICD-10-CM | POA: Diagnosis not present

## 2022-12-09 DIAGNOSIS — H53462 Homonymous bilateral field defects, left side: Secondary | ICD-10-CM | POA: Diagnosis not present

## 2022-12-09 DIAGNOSIS — E113519 Type 2 diabetes mellitus with proliferative diabetic retinopathy with macular edema, unspecified eye: Secondary | ICD-10-CM | POA: Diagnosis not present

## 2022-12-09 LAB — HM DIABETES EYE EXAM

## 2022-12-11 ENCOUNTER — Ambulatory Visit: Payer: Medicare HMO

## 2022-12-15 ENCOUNTER — Ambulatory Visit: Payer: Medicare HMO

## 2022-12-17 ENCOUNTER — Telehealth: Payer: Self-pay

## 2022-12-17 ENCOUNTER — Telehealth: Payer: Self-pay | Admitting: Physical Therapy

## 2022-12-17 NOTE — Telephone Encounter (Signed)
Faxed clearance note to [facility]

## 2022-12-17 NOTE — Telephone Encounter (Signed)
Spoke with patient's wife regarding using pt history with this clinic for a student case study and pt wife verbally agrees to this and happy to help. Pt wife informed no patient identifying information would be used. Pt and wife agreeable to be called for follow up information regarding the case study if needed.   Thresa Ross PT, DPT

## 2022-12-19 ENCOUNTER — Ambulatory Visit: Payer: Medicare HMO

## 2022-12-23 ENCOUNTER — Encounter: Payer: Self-pay | Admitting: Internal Medicine

## 2022-12-23 ENCOUNTER — Ambulatory Visit: Payer: Medicare HMO

## 2022-12-25 ENCOUNTER — Ambulatory Visit: Payer: Medicare HMO

## 2022-12-30 ENCOUNTER — Ambulatory Visit: Payer: Medicare HMO

## 2023-01-01 ENCOUNTER — Ambulatory Visit (INDEPENDENT_AMBULATORY_CARE_PROVIDER_SITE_OTHER): Payer: Medicare HMO | Admitting: Family Medicine

## 2023-01-01 ENCOUNTER — Encounter: Payer: Self-pay | Admitting: Family Medicine

## 2023-01-01 ENCOUNTER — Ambulatory Visit: Payer: Medicare HMO

## 2023-01-01 VITALS — BP 170/60 | HR 51 | Temp 97.3°F | Ht 73.0 in | Wt 260.0 lb

## 2023-01-01 DIAGNOSIS — I10 Essential (primary) hypertension: Secondary | ICD-10-CM | POA: Diagnosis not present

## 2023-01-01 DIAGNOSIS — Z23 Encounter for immunization: Secondary | ICD-10-CM

## 2023-01-01 DIAGNOSIS — Z7984 Long term (current) use of oral hypoglycemic drugs: Secondary | ICD-10-CM | POA: Diagnosis not present

## 2023-01-01 DIAGNOSIS — E113299 Type 2 diabetes mellitus with mild nonproliferative diabetic retinopathy without macular edema, unspecified eye: Secondary | ICD-10-CM

## 2023-01-01 LAB — BASIC METABOLIC PANEL
BUN: 38 mg/dL — ABNORMAL HIGH (ref 6–23)
CO2: 29 meq/L (ref 19–32)
Calcium: 9.2 mg/dL (ref 8.4–10.5)
Chloride: 100 meq/L (ref 96–112)
Creatinine, Ser: 1.3 mg/dL (ref 0.40–1.50)
GFR: 54.12 mL/min — ABNORMAL LOW (ref >60.00)
Glucose, Bld: 212 mg/dL — ABNORMAL HIGH (ref 70–99)
Potassium: 4.7 meq/L (ref 3.5–5.1)
Sodium: 136 meq/L (ref 135–145)

## 2023-01-01 LAB — HEMOGLOBIN A1C: Hgb A1c MFr Bld: 7.2 % — ABNORMAL HIGH (ref 4.6–6.5)

## 2023-01-01 MED ORDER — AMLODIPINE BESYLATE 2.5 MG PO TABS: 2.5000 mg | ORAL_TABLET | Freq: Every day | ORAL | 1 refills | Status: DC

## 2023-01-01 NOTE — Progress Notes (Signed)
BP elevated today.  Usually was lower at home.  Max SBP 160s and usually DBP <80, as recently as a few weeks ago.  Still on losartan 100mg  a day.    Diabetes:  Using medications without difficulties: yes Hypoglycemic episodes:no Hyperglycemic episodes:no Feet problems: Blood Sugars averaging: 130s, depending on diet.   eye exam within last year: 12/09/22  His driving is already restricted, d/w pt.   He has shoulder surgery pending.    Meds, vitals, and allergies reviewed.   ROS: Per HPI unless specifically indicated in ROS section   GEN: nad, alert and oriented HEENT: ncat NECK: supple w/o LA CV: rrr PULM: ctab, no inc wob ABD: soft, +bs EXT: no edema SKIN: well perfused.   25 minutes were devoted to patient care in this encounter (this includes time spent reviewing the patient's file/history, interviewing and examining the patient, counseling/reviewing plan with patient).

## 2023-01-01 NOTE — Patient Instructions (Addendum)
Check your BP at home and update me Monday.   Go to the lab on the way out.   If you have mychart we'll likely use that to update you.    Take care.  Glad to see you.  If your BP is above 150/90 then start taking amlodipine 2.5mg  a day.

## 2023-01-04 NOTE — Assessment & Plan Note (Addendum)
Check your BP at home and update me Monday.   If home BP is above 150/90 then start taking amlodipine 2.5mg  a day. See notes on labs.

## 2023-01-04 NOTE — Assessment & Plan Note (Signed)
A1c 7.2.  Recheck periodically.  Continue glimepiride metformin and Actos.

## 2023-01-05 ENCOUNTER — Encounter: Payer: Self-pay | Admitting: Family Medicine

## 2023-01-06 ENCOUNTER — Ambulatory Visit: Payer: Medicare HMO

## 2023-01-08 ENCOUNTER — Ambulatory Visit: Payer: Medicare HMO

## 2023-01-13 ENCOUNTER — Ambulatory Visit: Payer: Medicare HMO

## 2023-01-14 DIAGNOSIS — D485 Neoplasm of uncertain behavior of skin: Secondary | ICD-10-CM | POA: Diagnosis not present

## 2023-01-14 DIAGNOSIS — D0461 Carcinoma in situ of skin of right upper limb, including shoulder: Secondary | ICD-10-CM | POA: Diagnosis not present

## 2023-01-14 DIAGNOSIS — D2261 Melanocytic nevi of right upper limb, including shoulder: Secondary | ICD-10-CM | POA: Diagnosis not present

## 2023-01-14 DIAGNOSIS — Z08 Encounter for follow-up examination after completed treatment for malignant neoplasm: Secondary | ICD-10-CM | POA: Diagnosis not present

## 2023-01-14 DIAGNOSIS — D2271 Melanocytic nevi of right lower limb, including hip: Secondary | ICD-10-CM | POA: Diagnosis not present

## 2023-01-14 DIAGNOSIS — Z85828 Personal history of other malignant neoplasm of skin: Secondary | ICD-10-CM | POA: Diagnosis not present

## 2023-01-14 DIAGNOSIS — D225 Melanocytic nevi of trunk: Secondary | ICD-10-CM | POA: Diagnosis not present

## 2023-01-14 DIAGNOSIS — D2262 Melanocytic nevi of left upper limb, including shoulder: Secondary | ICD-10-CM | POA: Diagnosis not present

## 2023-01-14 DIAGNOSIS — D2272 Melanocytic nevi of left lower limb, including hip: Secondary | ICD-10-CM | POA: Diagnosis not present

## 2023-01-14 DIAGNOSIS — L821 Other seborrheic keratosis: Secondary | ICD-10-CM | POA: Diagnosis not present

## 2023-01-15 ENCOUNTER — Ambulatory Visit: Payer: Medicare HMO

## 2023-01-20 ENCOUNTER — Ambulatory Visit: Payer: Medicare HMO

## 2023-01-22 ENCOUNTER — Ambulatory Visit: Payer: Medicare HMO

## 2023-01-27 ENCOUNTER — Ambulatory Visit: Payer: Medicare HMO

## 2023-01-28 ENCOUNTER — Inpatient Hospital Stay: Payer: Medicare HMO | Attending: Oncology

## 2023-01-28 DIAGNOSIS — Z8673 Personal history of transient ischemic attack (TIA), and cerebral infarction without residual deficits: Secondary | ICD-10-CM | POA: Insufficient documentation

## 2023-01-28 DIAGNOSIS — Z818 Family history of other mental and behavioral disorders: Secondary | ICD-10-CM | POA: Diagnosis not present

## 2023-01-28 DIAGNOSIS — D696 Thrombocytopenia, unspecified: Secondary | ICD-10-CM | POA: Diagnosis not present

## 2023-01-28 DIAGNOSIS — I10 Essential (primary) hypertension: Secondary | ICD-10-CM | POA: Diagnosis not present

## 2023-01-28 DIAGNOSIS — Z833 Family history of diabetes mellitus: Secondary | ICD-10-CM | POA: Insufficient documentation

## 2023-01-28 DIAGNOSIS — Z809 Family history of malignant neoplasm, unspecified: Secondary | ICD-10-CM | POA: Insufficient documentation

## 2023-01-28 DIAGNOSIS — Z825 Family history of asthma and other chronic lower respiratory diseases: Secondary | ICD-10-CM | POA: Insufficient documentation

## 2023-01-28 DIAGNOSIS — R161 Splenomegaly, not elsewhere classified: Secondary | ICD-10-CM | POA: Diagnosis not present

## 2023-01-28 DIAGNOSIS — D72820 Lymphocytosis (symptomatic): Secondary | ICD-10-CM | POA: Diagnosis not present

## 2023-01-28 DIAGNOSIS — Z7902 Long term (current) use of antithrombotics/antiplatelets: Secondary | ICD-10-CM | POA: Insufficient documentation

## 2023-01-28 DIAGNOSIS — D509 Iron deficiency anemia, unspecified: Secondary | ICD-10-CM | POA: Insufficient documentation

## 2023-01-28 DIAGNOSIS — Z8719 Personal history of other diseases of the digestive system: Secondary | ICD-10-CM | POA: Insufficient documentation

## 2023-01-28 DIAGNOSIS — Z860101 Personal history of adenomatous and serrated colon polyps: Secondary | ICD-10-CM | POA: Diagnosis not present

## 2023-01-28 DIAGNOSIS — R590 Localized enlarged lymph nodes: Secondary | ICD-10-CM | POA: Insufficient documentation

## 2023-01-28 DIAGNOSIS — Z79899 Other long term (current) drug therapy: Secondary | ICD-10-CM | POA: Diagnosis not present

## 2023-01-28 DIAGNOSIS — Z8249 Family history of ischemic heart disease and other diseases of the circulatory system: Secondary | ICD-10-CM | POA: Diagnosis not present

## 2023-01-28 DIAGNOSIS — D508 Other iron deficiency anemias: Secondary | ICD-10-CM

## 2023-01-28 DIAGNOSIS — K219 Gastro-esophageal reflux disease without esophagitis: Secondary | ICD-10-CM | POA: Diagnosis not present

## 2023-01-28 LAB — CBC WITH DIFFERENTIAL (CANCER CENTER ONLY)
Abs Immature Granulocytes: 0.02 10*3/uL (ref 0.00–0.07)
Basophils Absolute: 0 10*3/uL (ref 0.0–0.1)
Basophils Relative: 1 %
Eosinophils Absolute: 0.1 10*3/uL (ref 0.0–0.5)
Eosinophils Relative: 2 %
HCT: 33.9 % — ABNORMAL LOW (ref 39.0–52.0)
Hemoglobin: 11.6 g/dL — ABNORMAL LOW (ref 13.0–17.0)
Immature Granulocytes: 0 %
Lymphocytes Relative: 23 %
Lymphs Abs: 1.2 10*3/uL (ref 0.7–4.0)
MCH: 31.4 pg (ref 26.0–34.0)
MCHC: 34.2 g/dL (ref 30.0–36.0)
MCV: 91.9 fL (ref 80.0–100.0)
Monocytes Absolute: 0.6 10*3/uL (ref 0.1–1.0)
Monocytes Relative: 11 %
Neutro Abs: 3.2 10*3/uL (ref 1.7–7.7)
Neutrophils Relative %: 63 %
Platelet Count: 80 10*3/uL — ABNORMAL LOW (ref 150–400)
RBC: 3.69 MIL/uL — ABNORMAL LOW (ref 4.22–5.81)
RDW: 14.9 % (ref 11.5–15.5)
WBC Count: 5.1 10*3/uL (ref 4.0–10.5)
nRBC: 0 % (ref 0.0–0.2)

## 2023-01-28 LAB — IRON AND TIBC
Iron: 60 ug/dL (ref 45–182)
Saturation Ratios: 18 % (ref 17.9–39.5)
TIBC: 340 ug/dL (ref 250–450)
UIBC: 280 ug/dL

## 2023-01-28 LAB — FERRITIN: Ferritin: 202 ng/mL (ref 24–336)

## 2023-01-29 ENCOUNTER — Ambulatory Visit: Payer: Medicare HMO

## 2023-02-03 ENCOUNTER — Ambulatory Visit: Payer: Medicare HMO

## 2023-02-04 ENCOUNTER — Inpatient Hospital Stay: Payer: Medicare HMO | Admitting: Oncology

## 2023-02-04 ENCOUNTER — Encounter: Payer: Self-pay | Admitting: Oncology

## 2023-02-04 VITALS — BP 190/58 | Temp 97.8°F | Resp 18 | Wt 260.2 lb

## 2023-02-04 DIAGNOSIS — D72820 Lymphocytosis (symptomatic): Secondary | ICD-10-CM | POA: Diagnosis not present

## 2023-02-04 DIAGNOSIS — K219 Gastro-esophageal reflux disease without esophagitis: Secondary | ICD-10-CM | POA: Diagnosis not present

## 2023-02-04 DIAGNOSIS — Z79899 Other long term (current) drug therapy: Secondary | ICD-10-CM | POA: Diagnosis not present

## 2023-02-04 DIAGNOSIS — D696 Thrombocytopenia, unspecified: Secondary | ICD-10-CM

## 2023-02-04 DIAGNOSIS — D508 Other iron deficiency anemias: Secondary | ICD-10-CM | POA: Diagnosis not present

## 2023-02-04 DIAGNOSIS — R590 Localized enlarged lymph nodes: Secondary | ICD-10-CM | POA: Diagnosis not present

## 2023-02-04 DIAGNOSIS — Z7902 Long term (current) use of antithrombotics/antiplatelets: Secondary | ICD-10-CM | POA: Diagnosis not present

## 2023-02-04 DIAGNOSIS — I10 Essential (primary) hypertension: Secondary | ICD-10-CM | POA: Diagnosis not present

## 2023-02-04 DIAGNOSIS — D509 Iron deficiency anemia, unspecified: Secondary | ICD-10-CM | POA: Diagnosis not present

## 2023-02-04 DIAGNOSIS — R161 Splenomegaly, not elsewhere classified: Secondary | ICD-10-CM | POA: Diagnosis not present

## 2023-02-04 NOTE — Assessment & Plan Note (Addendum)
Splenomegaly and Prominent/mildly enlarged mediastinal, hilar and retroperitoneal lymph nodes 03/19/2022 CT chest abdomen pelvis w contrast showed.improved thoracic adenopathy and spleen size.  Currently he has no constitutional symptoms Continue observation.

## 2023-02-04 NOTE — Assessment & Plan Note (Signed)
Due to splenomegaly. Stable counts, platelet count 80,000 at baseline.

## 2023-02-04 NOTE — Progress Notes (Signed)
Hematology/Oncology Progress note [phone] [phone]        CHIEF COMPLAINTS/REASON FOR VISIT:  Iron deficiency anemia splenomegaly, thrombocytopenia, monoclonal lymphocytosis   ASSESSMENT & PLAN:   IDA (iron deficiency anemia) Labs reviewed and discussed with patient. Lab Results  Component Value Date   HGB 11.6 (L) 01/28/2023   TIBC 340 01/28/2023   IRONPCTSAT 18 01/28/2023   FERRITIN 202 01/28/2023     I will hold off additional IV Venofer treatment at this point.   Monoclonal B-cell lymphocytosis Splenomegaly and Prominent/mildly enlarged mediastinal, hilar and retroperitoneal lymph nodes 03/19/2022 CT chest abdomen pelvis w contrast showed.improved thoracic adenopathy and spleen size.  Currently he has no constitutional symptoms Continue observation.    Thrombocytopenia (HCC) Due to splenomegaly. Stable counts, platelet count 80,000 at baseline.   Orders Placed This Encounter  Procedures   CMP (Cancer Center only)    Standing Status:   Future    Standing Expiration Date:   02/04/2024   CBC with Differential (Cancer Center Only)    Standing Status:   Future    Standing Expiration Date:   02/04/2024   Iron and TIBC    Standing Status:   Future    Standing Expiration Date:   02/04/2024   Ferritin    Standing Status:   Future    Standing Expiration Date:   02/04/2024   Immature Platelet Fraction    Standing Status:   Future    Standing Expiration Date:   02/04/2024   Flow cytometry panel-leukemia/lymphoma work-up    Standing Status:   Future    Standing Expiration Date:   02/04/2024   Follow up 6 months  All questions were answered. The patient knows to call the clinic with any problems, questions or concerns.  Dennis Patience, MD, PhD [facility] Hematology Oncology 02/04/2023    HISTORY OF PRESENTING ILLNESS:   Dennis Wise is a  72 y.o.  male presents for follow-up of iron deficiency anemia, splenomegaly, thrombocytopenia  pia, monoclonal lymphocytosis  03/06/2021, patient had blood work done.  CBC showed normal WBC of 4.6, hemoglobin 10.9, normal platelet count at 154,000.  Chemistry labs showed normal creatinine at 1.47 with a GFR of 47.3, normal bilirubin.  Potassium was elevated at 5.2.  Patient reports a history of MGUS.  Initially diagnosed by oncology Dr. Welton Flakes. 04/16/2021, kappa light chain ratio was elevated at 1.67.  04/17/2011, SPEP showed no monoclonal protein. 04/25/2011 24-hour urine protein 578. 04/25/2011 patient had a bone marrow biopsy and aspirate which showed a 3% of polyclonal plasma cells staining for kappa and lambda light chain and lack of large aggregates or sheets.  Overall changes are nonspecific.  Patient L was seen by oncology Dr. Serena Croissant who did not feel that patient meets diagnostic criteria for MGUS and the patient was discharged.  06/04/2021, bone marrow biopsy showed hypercellular marrow with mild erythroid atypia.  Morphology features do not provide a precise explanation for patient's cytopenia.  Absence of significant morphological dysplasia makes an MDS less likely and alternative causes differential is wide.  B-cell gene rearrangement was detected.  This is likely secondary to monoclonal lymphocytosis.  And T-cell gene arrangement were detected-this is likely reactive  MDS FISH panel is negative. NGS negative.  07/19/2021, CT images were reviewed and discussed with patient. -Patient has prominent/mildly enlarged mediastinal hilar and retroperitoneal lymph nodes.  Nonspecific.  Reactive versus malignant.-  # RF is elevated.  Patient has establish care with rheumatology Dr. Allena Katz and had  autoimmune disease work-up to Dr.Patel did not feel he has RA   Patient establish care with pulmonology Dr. Karna Christmas for abnormal CT findings.  Differential includes [name], hypersensitivity pneumonitis. He was treated with a course of prednisone  INTERVAL HISTORY Dennis Wise is a 72  y.o. male who has above history reviewed by me today presents for follow up visit for management of Iron deficiency anemia splenomegaly, thrombocytopenia, monoclonal lymphocytosis Patient was accompanied by wife.  Chronic fatigue, May 2024 he had a stroke. Now on Plavix.  No weight loss   Review of Systems  Constitutional:  Positive for fatigue. Negative for appetite change, chills, fever and unexpected weight change.  HENT:   Negative for hearing loss and voice change.   Eyes:  Negative for eye problems and icterus.  Respiratory:  Negative for chest tightness, cough and shortness of breath.   Cardiovascular:  Negative for chest pain and leg swelling.  Gastrointestinal:  Negative for abdominal distention and abdominal pain.  Endocrine: Negative for hot flashes.  Genitourinary:  Positive for frequency. Negative for difficulty urinating and dysuria.   Musculoskeletal:  Negative for arthralgias.  Skin:  Negative for itching and rash.  Neurological:  Negative for light-headedness and numbness.  Hematological:  Negative for adenopathy. Does not bruise/bleed easily.  Psychiatric/Behavioral:  Negative for confusion.     MEDICAL HISTORY:  Past Medical History:  Diagnosis Date   Anemia    Iron deficiency part of this   Cataract    bilateral lens implants   Diabetes mellitus    type II   [name] (HCC)    GERD (gastroesophageal reflux disease) 01/1989   Helicobacter pylori gastritis 06/11/2011   On EGD 05/2011    Hyperlipidemia    Hypertension 11/1999   Internal hemorrhoids    Iron deficiency anemia, unspecified 03/04/2011   MGUS (monoclonal gammopathy of unknown significance)    possible dx initially, had negative f/u   Pancytopenia, acquired (HCC)    Personal history of colonic adenomas 06/05/2012   Pneumonia    Stroke [facility])     SURGICAL HISTORY: Past Surgical History:  Procedure Laterality Date   CATARACT EXTRACTION Bilateral    COLONOSCOPY      ESOPHAGOGASTRODUODENOSCOPY     2012 H. pylori gastritis   FLEXIBLE SIGMOIDOSCOPY  05/1988   internal hemorrhoids   KNEE SURGERY  12/2002   lt knee fx repair ORIF   TIBIA FRACTURE SURGERY     left 2012    SOCIAL HISTORY: Social History   Socioeconomic History   Marital status: Married    Spouse name: Not on file   Number of children: 1   Years of education: Not on file   Highest education level: 12th grade  Occupational History   Not on file  Tobacco Use   Smoking status: Never   Smokeless tobacco: Never  Vaping Use   Vaping status: Never Used  Substance and Sexual Activity   Alcohol use: Never   Drug use: No   Sexual activity: Yes    Birth control/protection: None  Other Topics Concern   Not on file  Social History Narrative   Prev traveling for sports broadcasting    Working for [employer], [employer] etc as of 2019   Married 1970   1 child   [employer]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   Right handed   One floor home   Drinks caffeine   Lives with wife   Social Determinants of Health   Financial Resource Strain: Low  Risk  (08/29/2022)   Overall Financial Resource Strain (CARDIA)    Difficulty of Paying Living Expenses: Not hard at all  Food Insecurity: No Food Insecurity (09/05/2022)   Hunger Vital Sign    Worried About Running Out of Food in the Last Year: Never true    Ran Out of Food in the Last Year: Never true  Transportation Needs: No Transportation Needs (09/05/2022)   PRAPARE - Administrator, Civil Service (Medical): No    Lack of Transportation (Non-Medical): No  Physical Activity: Inactive (08/29/2022)   Exercise Vital Sign    Days of Exercise per Week: 0 days    Minutes of Exercise per Session: 30 min  Stress: Stress Concern Present (08/29/2022)   [employer] of Occupational Health - Occupational Stress Questionnaire    Feeling of Stress : To some extent  Social Connections: Socially Isolated (08/29/2022)   Social Connection and Isolation Panel [NHANES]     Frequency of Communication with Friends and Family: Once a week    Frequency of Social Gatherings with Friends and Family: Once a week    Attends Religious Services: Never    Database administrator or Organizations: No    Attends Banker Meetings: Never    Marital Status: Married  Catering manager Violence: Not At Risk (09/05/2022)   Humiliation, Afraid, Rape, and Kick questionnaire    Fear of Current or Ex-Partner: No    Emotionally Abused: No    Physically Abused: No    Sexually Abused: No    FAMILY HISTORY: Family History  Problem Relation Age of Onset   Cancer Mother        ?   Diabetes Mother    Heart failure Mother    Emphysema Father    Cancer Father        ?   Alzheimer's disease Father    Skin cancer Brother    Diabetes Brother    Colon cancer Neg Hx    Prostate cancer Neg Hx    Esophageal cancer Neg Hx    Rectal cancer Neg Hx    Stomach cancer Neg Hx     ALLERGIES:  is allergic to hydralazine and promethazine hcl.  MEDICATIONS:  Current Outpatient Medications  Medication Sig Dispense Refill   acetaminophen (TYLENOL) 325 MG tablet Take 2 tablets (650 mg total) by mouth every 4 (four) hours as needed for mild pain (or temp > 37.5 C (99.5 F)). 20 tablet 0   atorvastatin (LIPITOR) 10 MG tablet TAKE 1 TABLET AT BEDTIME 90 tablet 10   clopidogrel (PLAVIX) 75 MG tablet Take 1 tablet (75 mg total) by mouth daily. 90 tablet 3   glimepiride (AMARYL) 2 MG tablet TAKE 1 TABLET EVERY DAY 90 tablet 10   IRON-VITAMIN C PO Take by mouth daily. 60 mg tablet     losartan (COZAAR) 100 MG tablet Take 1 tablet (100 mg total) by mouth daily. 90 tablet 3   memantine (NAMENDA) 5 MG tablet Take 1 tablet (5 mg total) by mouth at bedtime. 30 tablet 11   metFORMIN (GLUCOPHAGE) 850 MG tablet TAKE 1 TABLET TWICE DAILY 180 tablet 10   Multiple Vitamin (MULTIVITAMIN) tablet Take 1 tablet by mouth daily. Centrum Silver     niacin (NIASPAN) 500 MG CR tablet Take 3 tablets  (1,500 mg total) by mouth daily.     pioglitazone (ACTOS) 45 MG tablet TAKE 1 TABLET EVERY DAY 90 tablet 10   amLODipine (NORVASC) 2.5 MG  tablet Take 2.5 mg by mouth daily. (Patient not taking: Reported on 02/04/2023)     No current facility-administered medications for this visit.     PHYSICAL EXAMINATION: ECOG PERFORMANCE STATUS: 1 - Symptomatic but completely ambulatory Vitals:   02/04/23 1004 02/04/23 1014  BP: (!) 193/57 (!) 190/58  Resp: 18   Temp: 97.8 F (36.6 C)    Filed Weights   02/04/23 1004  Weight: 260 lb 3.2 oz (118 kg)    Physical Exam Constitutional:      General: He is not in acute distress.    Appearance: He is obese.  HENT:     Head: Normocephalic and atraumatic.  Eyes:     General: No scleral icterus. Cardiovascular:     Rate and Rhythm: Normal rate and regular rhythm.     Heart sounds: Normal heart sounds.  Pulmonary:     Effort: Pulmonary effort is normal. No respiratory distress.     Breath sounds: No wheezing.  Abdominal:     General: Bowel sounds are normal. There is no distension.     Palpations: Abdomen is soft.  Musculoskeletal:        General: No deformity. Normal range of motion.     Cervical back: Normal range of motion and neck supple.     Right lower leg: Edema present.     Left lower leg: Edema present.  Skin:    General: Skin is warm and dry.     Findings: No erythema or rash.  Neurological:     Mental Status: He is alert and oriented to person, place, and time. Mental status is at baseline.     Cranial Nerves: No cranial nerve deficit.     Coordination: Coordination normal.  Psychiatric:        Mood and Affect: Mood normal.     LABORATORY DATA:  I have reviewed the data as listed    Latest Ref Rng & Units 01/28/2023    8:39 AM 09/30/2022   12:37 PM 09/07/2022    4:09 AM  CBC  WBC 4.0 - 10.5 K/uL 5.1  5.2  5.3   Hemoglobin 13.0 - 17.0 g/dL 42.5  95.6  38.7   Hematocrit 39.0 - 52.0 % 33.9  32.5  33.0   Platelets 150 -  400 K/uL 80  127.0  100       Latest Ref Rng & Units 01/01/2023   12:18 PM 10/29/2022   10:16 AM 10/24/2022    7:55 AM  CMP  Glucose 70 - 99 mg/dL 564   332   BUN 6 - 23 mg/dL 38   35   Creatinine 9.51 - 1.50 mg/dL 8.84   1.66   Sodium 063 - 145 mEq/L 136   135   Potassium 3.5 - 5.1 mEq/L 4.7   4.5   Chloride 96 - 112 mEq/L 100   99   CO2 19 - 32 mEq/L 29   28   Calcium 8.4 - 10.5 mg/dL 9.2   9.2   Total Protein 6.0 - 8.3 g/dL  6.5    Total Bilirubin 0.2 - 1.2 mg/dL  1.0    Alkaline Phos 39 - 117 U/L  138    AST 0 - 37 U/L  18    ALT 0 - 53 U/L  19       Lab Results  Component Value Date   IRON 60 01/28/2023   TIBC 340 01/28/2023   FERRITIN 202 01/28/2023  RADIOGRAPHIC STUDIES: I have personally reviewed the radiological images as listed and agreed with the findings in the report. No results found.

## 2023-02-04 NOTE — Assessment & Plan Note (Signed)
Labs reviewed and discussed with patient. Lab Results  Component Value Date   HGB 11.6 (L) 01/28/2023   TIBC 340 01/28/2023   IRONPCTSAT 18 01/28/2023   FERRITIN 202 01/28/2023     I will hold off additional IV Venofer treatment at this point.

## 2023-02-04 NOTE — Progress Notes (Signed)
Pt here for follow up. Reports that he had a stroke in May

## 2023-02-05 ENCOUNTER — Ambulatory Visit: Payer: Medicare HMO

## 2023-02-10 ENCOUNTER — Ambulatory Visit: Payer: Medicare HMO

## 2023-02-11 ENCOUNTER — Ambulatory Visit: Payer: Medicare HMO | Admitting: Internal Medicine

## 2023-02-11 ENCOUNTER — Other Ambulatory Visit (INDEPENDENT_AMBULATORY_CARE_PROVIDER_SITE_OTHER): Payer: Medicare HMO

## 2023-02-11 ENCOUNTER — Encounter: Payer: Self-pay | Admitting: Internal Medicine

## 2023-02-11 VITALS — BP 120/60 | HR 66 | Ht 73.0 in | Wt 261.6 lb

## 2023-02-11 DIAGNOSIS — K31A0 Gastric intestinal metaplasia, unspecified: Secondary | ICD-10-CM | POA: Diagnosis not present

## 2023-02-11 DIAGNOSIS — K2970 Gastritis, unspecified, without bleeding: Secondary | ICD-10-CM

## 2023-02-11 DIAGNOSIS — K760 Fatty (change of) liver, not elsewhere classified: Secondary | ICD-10-CM

## 2023-02-11 LAB — HEPATIC FUNCTION PANEL
ALT: 15 U/L (ref 0–53)
AST: 18 U/L (ref 0–37)
Albumin: 4.1 g/dL (ref 3.5–5.2)
Alkaline Phosphatase: 140 U/L — ABNORMAL HIGH (ref 39–117)
Bilirubin, Direct: 0.2 mg/dL (ref 0.0–0.3)
Total Bilirubin: 0.8 mg/dL (ref 0.2–1.2)
Total Protein: 6.9 g/dL (ref 6.0–8.3)

## 2023-02-11 LAB — PROTIME-INR
INR: 1 {ratio} (ref 0.8–1.0)
Prothrombin Time: 10.7 s (ref 9.6–13.1)

## 2023-02-11 NOTE — Progress Notes (Signed)
Dennis Wise 72 y.o. 1950-10-20 [id number]  Assessment & Plan:   Encounter Diagnoses  Name Primary?   NAFLD (nonalcoholic fatty liver disease) Yes   Gastritis without bleeding, unspecified chronicity, unspecified gastritis type    Gastric intestinal metaplasia    Evaluate with labs as below today and plan to repeat elastography in April 2025.  He should work towards controlling and improving metabolic health by controlling diabetes and weight loss if possible. Orders Placed This Encounter  Procedures   Hepatic function panel   INR/PT    Deferred any follow-up of gastric intestinal metaplasia at this time.  He has a pending shoulder surgery.  We talked about how some experts recommend mapping of the stomach in this situation because of a rare but real risk of gastric cancer.  He does not have any family members with gastric cancer.  We can reconsider this next year.  1 year recall is already in place for January and I will review chart then.    CC: [name], MD   Subjective:   Chief Complaint: Follow-up of fatty liver disease  HPI 72 year old white man with a history of iron deficiency anemia, monoclonal B-cell lymphocytosis, elevated alkaline phosphatase and abnormal liver with enlarged caudate lobe and splenomegaly on imaging. He also has thrombocytopenia. I saw him in December 2023, EGD and colonoscopy did not reveal a cause of iron deficiency anemia though he had some gastritis with intestinal metaplasia. No H. pylori. Duodenal biopsies with focal foveolar hyperplasia but no signs of celiac disease. Colonoscopy normal. These were performed 05/15/2022.   I then saw him on 08/05/2022.  I thought he probably had cirrhosis but he did not have signs of portal hypertension on the recent EGD.  Suspected metabolic associated fatty liver disease.  Iron deficiency anemia follow-up through hematology.  08/12/2022 ultrasound hepatic elastography with probable mild fatty  infiltration and a median K PA number 3.8 which was high probability of being normal The studies listed below were all negative/normal also.  Previous hep C and ANA negative.  Hepatitis B surface antigen   Hepatitis B core antibody, total   Alpha-1 antitrypsin phenotype   Hepatitis A antibody, total   Hepatitis B surface antibody,qualitative   Anti-smooth muscle antibody, IgG   Mitochondrial antibodies   Follow-up LFTs in July 2024 showed alk phos 138 AST 18 ALT 19 normal albumin at 3.8 and a total bilirubin of 1.0.  He is here for follow-up.  Wife is present.  He really does not have any gastrointestinal complaints.  Main issues are since his stroke he is unable to drive due to loss of peripheral vision, he disagrees with some of the testing would very much like to drive again.  That is caused a major disruption in his life.  He is also unable to work and feels like he has been forced into retirement.  He has been started on Namenda for memory impairment by neurology in May.  He was admitted in May for stroke.  Wt Readings from Last 3 Encounters:  02/11/23 261 lb 9.6 oz (118.7 kg)  02/04/23 260 lb 3.2 oz (118 kg)  01/01/23 260 lb (117.9 kg)    Allergies  Allergen Reactions   Hydralazine     Short of breath.     Promethazine Hcl     REACTION: AGITIATION   Current Meds  Medication Sig   acetaminophen (TYLENOL) 325 MG tablet Take 2 tablets (650 mg total) by mouth every 4 (four) hours  as needed for mild pain (or temp > 37.5 C (99.5 F)).   atorvastatin (LIPITOR) 10 MG tablet TAKE 1 TABLET AT BEDTIME   clopidogrel (PLAVIX) 75 MG tablet Take 1 tablet (75 mg total) by mouth daily.   glimepiride (AMARYL) 2 MG tablet TAKE 1 TABLET EVERY DAY   IRON-VITAMIN C PO Take by mouth daily. 60 mg tablet   losartan (COZAAR) 100 MG tablet Take 1 tablet (100 mg total) by mouth daily.   memantine (NAMENDA) 5 MG tablet Take 1 tablet (5 mg total) by mouth at bedtime.   metFORMIN (GLUCOPHAGE) 850 MG  tablet TAKE 1 TABLET TWICE DAILY   Multiple Vitamin (MULTIVITAMIN) tablet Take 1 tablet by mouth daily. Centrum Silver   niacin (NIASPAN) 500 MG CR tablet Take 3 tablets (1,500 mg total) by mouth daily.   pioglitazone (ACTOS) 45 MG tablet TAKE 1 TABLET EVERY DAY   Past Medical History:  Diagnosis Date   Anemia    Iron deficiency part of this   Cataract    bilateral lens implants   Diabetes mellitus    type II   [name] (HCC)    GERD (gastroesophageal reflux disease) 01/1989   Helicobacter pylori gastritis 06/11/2011   On EGD 05/2011    Hyperlipidemia    Hypertension 11/1999   Internal hemorrhoids    Iron deficiency anemia, unspecified 03/04/2011   MGUS (monoclonal gammopathy of unknown significance)    possible dx initially, had negative f/u   Pancytopenia, acquired (HCC)    Personal history of colonic adenomas 06/05/2012   Pneumonia    Stroke [facility])    Past Surgical History:  Procedure Laterality Date   CATARACT EXTRACTION Bilateral    COLONOSCOPY     ESOPHAGOGASTRODUODENOSCOPY     2012 H. 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   Right handed   One floor home   Drinks caffeine   Lives with wife   family history includes Alzheimer's disease in his father; Cancer in his father and mother; Diabetes in his brother and mother; Emphysema in his father; Heart failure in his mother; Skin cancer in his brother.   Review of Systems As per HPI  Objective:   Physical Exam BP 120/60   Pulse 66   Ht 6\' 1"  (1.854 m)   Wt 261 lb 9.6 oz (118.7 kg)   SpO2 99%   BMI 34.51 kg/m  CHEST: Lungs clear. CARDIOVASCULAR: Two over six systolic ejection  murmur. ABDOMEN: No hepatosplenomegaly, no masses, no ascites suspected. EXTREMITIES: Chronic venous stasis changes. Trace edema in pretibial area

## 2023-02-11 NOTE — Patient Instructions (Addendum)
Your provider has requested that you go to the basement level for lab work before leaving today. Press "B" on the elevator. The lab is located at the first door on the left as you exit the elevator.  _______________________________________________________  If your blood pressure at your visit was 140/90 or greater, please contact your primary care physician to follow up on this.  _______________________________________________________  If you are age 74 or older, your body mass index should be between 23-30. Your Body mass index is 34.51 kg/m. If this is out of the aforementioned range listed, please consider follow up with your Primary Care Provider.  If you are age 49 or younger, your body mass index should be between 19-25. Your Body mass index is 34.51 kg/m. If this is out of the aformentioned range listed, please consider follow up with your Primary Care Provider.   ________________________________________________________  The [facility] providers would like to encourage you to use [facility] to communicate with providers for non-urgent requests or questions.  Due to long hold times on the telephone, sending your provider a message by [facility] may be a faster and more efficient way to get a response.  Please allow 48 business hours for a response.  Please remember that this is for non-urgent requests.  _______________________________________________________  I appreciate the opportunity to care for you. Stan Head, MD, [facility]

## 2023-02-16 ENCOUNTER — Other Ambulatory Visit: Payer: Self-pay | Admitting: Family Medicine

## 2023-02-16 DIAGNOSIS — E113299 Type 2 diabetes mellitus with mild nonproliferative diabetic retinopathy without macular edema, unspecified eye: Secondary | ICD-10-CM

## 2023-02-19 ENCOUNTER — Other Ambulatory Visit (INDEPENDENT_AMBULATORY_CARE_PROVIDER_SITE_OTHER): Payer: Medicare HMO

## 2023-02-19 DIAGNOSIS — E113299 Type 2 diabetes mellitus with mild nonproliferative diabetic retinopathy without macular edema, unspecified eye: Secondary | ICD-10-CM

## 2023-02-19 LAB — BASIC METABOLIC PANEL
BUN: 32 mg/dL — ABNORMAL HIGH (ref 6–23)
CO2: 29 meq/L (ref 19–32)
Calcium: 9.4 mg/dL (ref 8.4–10.5)
Chloride: 101 meq/L (ref 96–112)
Creatinine, Ser: 1.3 mg/dL (ref 0.40–1.50)
GFR: 54.07 mL/min — ABNORMAL LOW (ref >60.00)
Glucose, Bld: 168 mg/dL — ABNORMAL HIGH (ref 70–99)
Potassium: 4.4 meq/L (ref 3.5–5.1)
Sodium: 140 meq/L (ref 135–145)

## 2023-02-19 LAB — LIPID PANEL
Cholesterol: 165 mg/dL (ref 0–200)
HDL: 97 mg/dL (ref >39.00)
LDL Cholesterol: 53 mg/dL (ref 0–99)
NonHDL: 68.33
Total CHOL/HDL Ratio: 2
Triglycerides: 77 mg/dL (ref 0.0–149.0)
VLDL: 15.4 mg/dL (ref 0.0–40.0)

## 2023-02-19 LAB — MICROALBUMIN / CREATININE URINE RATIO
Creatinine,U: 66.7 mg/dL
Microalb Creat Ratio: 111.2 mg/g — ABNORMAL HIGH (ref 0.0–30.0)
Microalb, Ur: 74.2 mg/dL — ABNORMAL HIGH (ref 0.0–1.9)

## 2023-02-25 DIAGNOSIS — D0461 Carcinoma in situ of skin of right upper limb, including shoulder: Secondary | ICD-10-CM | POA: Diagnosis not present

## 2023-02-26 ENCOUNTER — Encounter: Payer: Self-pay | Admitting: Family Medicine

## 2023-02-26 ENCOUNTER — Ambulatory Visit (INDEPENDENT_AMBULATORY_CARE_PROVIDER_SITE_OTHER): Payer: Medicare HMO | Admitting: Family Medicine

## 2023-02-26 VITALS — BP 138/68 | HR 52 | Temp 98.3°F | Ht 73.0 in | Wt 266.2 lb

## 2023-02-26 DIAGNOSIS — Z8673 Personal history of transient ischemic attack (TIA), and cerebral infarction without residual deficits: Secondary | ICD-10-CM | POA: Diagnosis not present

## 2023-02-26 DIAGNOSIS — E113299 Type 2 diabetes mellitus with mild nonproliferative diabetic retinopathy without macular edema, unspecified eye: Secondary | ICD-10-CM

## 2023-02-26 DIAGNOSIS — M25519 Pain in unspecified shoulder: Secondary | ICD-10-CM

## 2023-02-26 DIAGNOSIS — Z7984 Long term (current) use of oral hypoglycemic drugs: Secondary | ICD-10-CM

## 2023-02-26 MED ORDER — METFORMIN HCL 850 MG PO TABS: 850.0000 mg | ORAL_TABLET | Freq: Two times a day (BID) | ORAL | 10 refills | Status: DC

## 2023-02-26 MED ORDER — GLIMEPIRIDE 2 MG PO TABS: 2.0000 mg | ORAL_TABLET | Freq: Every day | ORAL | 10 refills | Status: DC

## 2023-02-26 MED ORDER — PIOGLITAZONE HCL 45 MG PO TABS: 45.0000 mg | ORAL_TABLET | Freq: Every day | ORAL | 10 refills | Status: DC

## 2023-02-26 MED ORDER — ATORVASTATIN CALCIUM 10 MG PO TABS: 10.0000 mg | ORAL_TABLET | Freq: Every day | ORAL | 10 refills | Status: AC

## 2023-02-26 NOTE — Patient Instructions (Addendum)
If your sugar is reasonable on home checks then plan on office visit in 05/2023 with A1c at the visit.  You don't need to fast.   Take care.  Glad to see you.

## 2023-02-26 NOTE — Progress Notes (Signed)
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  Cautions discussed with patient.  Discussed that he can't work until he has his shoulder surgery done, and that is delayed by prev CVA.  He is going to f/u with GI in April.   DM2.  MALB positive.  No low sugars.  Glucose not checked often at home.  Prev A1c d/w pt.   He isn't lightheaded.    Elevated Cholesterol: Using medications without problems: yes Muscle aches: no Diet compliance: d/w pt.  Exercise: d/w pt.  Meds, vitals, and allergies reviewed.   ROS: Per HPI unless specifically indicated in ROS section   Nad Ncat Neck supple, no LA Rrr Ctab Abd soft, not ttp Skin well-perfused Superficial scrape L shin, doesn't appear infected.   38 minutes were devoted to patient care in this encounter (this includes time spent reviewing the patient's file/history, interviewing and examining the patient, counseling/reviewing plan with patient).

## 2023-03-01 NOTE — Assessment & Plan Note (Signed)
If his sugar is reasonable on home checks then plan on office visit in 05/2023 with A1c at the visit.  See after visit summary.  Microalbumin positive.  Continue losartan.  Continue metformin Actos and glimepiride in the meantime.

## 2023-03-01 NOTE — Assessment & Plan Note (Signed)
Continue Plavix.  Cautions discussed with patient.  Continue atorvastatin.

## 2023-03-01 NOTE — Assessment & Plan Note (Signed)
Still with pain on range of motion and his surgery has been delayed by his stroke.  Discussed.

## 2023-03-06 DIAGNOSIS — Z961 Presence of intraocular lens: Secondary | ICD-10-CM | POA: Diagnosis not present

## 2023-03-06 DIAGNOSIS — H4311 Vitreous hemorrhage, right eye: Secondary | ICD-10-CM | POA: Diagnosis not present

## 2023-03-06 DIAGNOSIS — H02834 Dermatochalasis of left upper eyelid: Secondary | ICD-10-CM | POA: Diagnosis not present

## 2023-03-06 DIAGNOSIS — H53462 Homonymous bilateral field defects, left side: Secondary | ICD-10-CM | POA: Diagnosis not present

## 2023-03-06 DIAGNOSIS — H40003 Preglaucoma, unspecified, bilateral: Secondary | ICD-10-CM | POA: Diagnosis not present

## 2023-03-06 DIAGNOSIS — H02831 Dermatochalasis of right upper eyelid: Secondary | ICD-10-CM | POA: Diagnosis not present

## 2023-03-06 DIAGNOSIS — E113513 Type 2 diabetes mellitus with proliferative diabetic retinopathy with macular edema, bilateral: Secondary | ICD-10-CM | POA: Diagnosis not present

## 2023-03-11 DIAGNOSIS — D0461 Carcinoma in situ of skin of right upper limb, including shoulder: Secondary | ICD-10-CM | POA: Diagnosis not present

## 2023-03-13 ENCOUNTER — Ambulatory Visit (INDEPENDENT_AMBULATORY_CARE_PROVIDER_SITE_OTHER): Payer: Medicare HMO | Admitting: Family Medicine

## 2023-03-13 ENCOUNTER — Encounter: Payer: Self-pay | Admitting: Family Medicine

## 2023-03-13 VITALS — BP 138/74 | HR 55 | Temp 98.4°F | Ht 73.0 in | Wt 262.0 lb

## 2023-03-13 DIAGNOSIS — S99929A Unspecified injury of unspecified foot, initial encounter: Secondary | ICD-10-CM

## 2023-03-13 MED ORDER — CEPHALEXIN 500 MG PO CAPS: 500.0000 mg | ORAL_CAPSULE | Freq: Three times a day (TID) | ORAL | 0 refills | Status: DC

## 2023-03-13 NOTE — Progress Notes (Unsigned)
Stubbed toe, split toenail. >1 week ago.  Not painful . No fevers.  Hasn't drained pus but some serosanguinous fluid.     Meds, vitals, and allergies reviewed.   ROS: Per HPI unless specifically indicated in ROS section   Nad Ncat R 1st nail split longitudinally.  Medial 20% is intact.  Lateral 80% is minimally attached but not draining any blood or pus.  Lateral portion of the nail not tender on manipulation.  There is some redness proximal to the nailbed but no fluctuant mass.  We talked about options.  It looks like he is going to lose the lateral portion of the nail and it may be less traumatic to go ahead and remove it.  He consented for removal.  I grasped the end of the lateral portion of the nail with a hemostat and it was removed easily with a gentle tug and this was not painful for the patient.  The underlying nailbed still looked healthy.  Medial portion of the nail was left intact.  I dressed the area with Neosporin and a nonstick bandage and then Coban around that.  He tolerated all of that well.  I would get his sock and shoe back on.

## 2023-03-13 NOTE — Patient Instructions (Signed)
Keep the remaining nail trimmed.  Keep the clean and covered in the meantime.  Update me as needed.  Start keflex in the meantime.  Take care.  Glad to see you.

## 2023-03-15 DIAGNOSIS — S99929A Unspecified injury of unspecified foot, initial encounter: Secondary | ICD-10-CM | POA: Insufficient documentation

## 2023-03-15 NOTE — Assessment & Plan Note (Signed)
Keep the remaining nail trimmed.  Keep the clean and covered in the meantime.  Update me as needed.  Start keflex in the meantime given the proximal erythema.  Okay for outpatient follow-up.  He has normal capillary refill and no fluctuant mass and he should gradually heal up.  He is aware that he may lose the remaining portion of the nail.

## 2023-03-23 ENCOUNTER — Ambulatory Visit: Payer: Medicare HMO | Admitting: Physician Assistant

## 2023-03-23 ENCOUNTER — Encounter: Payer: Self-pay | Admitting: Physician Assistant

## 2023-03-23 VITALS — BP 151/60 | HR 55 | Resp 20 | Ht 73.0 in | Wt 260.0 lb

## 2023-03-23 DIAGNOSIS — R413 Other amnesia: Secondary | ICD-10-CM | POA: Diagnosis not present

## 2023-03-23 DIAGNOSIS — I63531 Cerebral infarction due to unspecified occlusion or stenosis of right posterior cerebral artery: Secondary | ICD-10-CM

## 2023-03-23 MED ORDER — MEMANTINE HCL 5 MG PO TABS: 5.0000 mg | ORAL_TABLET | Freq: Two times a day (BID) | ORAL | 11 refills | Status: AC

## 2023-03-23 NOTE — Progress Notes (Signed)
Assessment/Plan:   Memory Impairment   Dennis Wise is a very pleasant 72 y.o. RH male with a history of  DM2, hypertension, hyperlipidemia, MGUS, history of bradycardia ( per wife's report) , chronic thrombocytopenia, normocytic anemia, CKD stage III A, history of acute ischemic right PCA stroke, anemia,  presenting today in follow-up for evaluation of memory loss. Patient is on memantine 5 mg daily . Memory is stable, MMSE 28/30. Patient is able to participate on his ADLs. Still unable to drive due to L hemianopsia.        Recommendations:   Follow up in 6  months. Increase Memantine 5 mg twice daily. Side effects were discussed  Patient is scheduled for neuropsych evaluation on Jan 2025 for clarity of diagnosis. Recommend good control of cardiovascular risk factors Continue to control mood as per PCP Recommend no driving due to L hemianopsia unless recommended   R PCA ischemic stroke  .   In review, he presented to the ED with clumsiness, unsteady gait, and 3 episodes of falls preceding the neurological events.  CT of the head at the ED showed hypodensity in the right PCA territory.  CT angio of the head and neck showed complete occlusion of the right PCA and focal atherosclerotic disease in the right greater than left MCA severe stenosis of the M2 branches, occlusion of the small right MCA branch and anterior temporal lobe stenosis, and left M2 and occluded or severely stenosed A1. neurology was consulted, starting him on dual antiplatelet therapy with Plavix and aspirin for 3 months as they felt that this was due to LV disease, and then Plavix monotherapy.  He also continued atorvastatin 10 mg daily given his LDL is at goal of 59.  Echo showed LVEF between 65 and 70%, with no intracardiac source of embolism.   Continue Plavix daily Recommend good control of cardiovascular risk factors.   Follow with Cardiology  Continue secondary stroke prevention with statin and BP meds     Subjective:   This patient is accompanied in the office by his wife  who supplements the history. Previous records as well as any outside records available were reviewed prior to todays visit.   Patient was last seen on 09/17/22 with MoCA 20/30     Any changes in memory since last visit? "About the same overall, some days better than others". Patient has some difficulty remembering recent conversations, new information and people names (never been very good with that). Likes to do the crossword puzzles  repeats oneself?  Endorsed Disoriented when walking into a room?  Patient denies    Misplacing objects?  Patient denies   Wandering behavior?   denies   Any personality changes since last visit?   denies   Any worsening depression?: denies   Hallucinations or paranoia?  denies   Seizures?   denies    Any sleep changes?  Does not sleep very well, has some vivid dreams, denies REM behavior or sleepwalking, may take in his sleep sometimes.   Sleep apnea?   denies    Any hygiene concerns?   denies   Independent of bathing and dressing?  Endorsed  Does the patient needs help with medications? Patient is in charge, wife places them in a pillbox.  Who is in charge of the finances? Wife is in charge.    Any changes in appetite?  Denies.     Patient have trouble swallowing?  Denies.   Does the patient cook?  Any kitchen  accidents such as leaving the stove on?   denies   Any headaches?    denies   Vision changes? Does not have good depth perception after the stroke R better than L, but "has gotten better", sees ophthalmology at [facility]. Chronic pain? Chronic L shoulder pain due to L rotator calf tear, follows with Ortho. Ambulates with difficulty?    Denies. Walks well. His field of vision is much better.  Recent falls or head injuries?    denies      Unilateral weakness, numbness or tingling? No residual symptoms after his stroke. Any tremors?  denies   Any anosmia?    denies   Any incontinence  of urine? He reports that he gets up at night because he drinks water before going to sleep.  Any bowel dysfunction?  denies      Patient lives with his wife Does the patient drive? Has not been driving due to depth perception after his stroke.   Initial visit 08/2022   How long did patient have memory difficulties? He denies, "but everyone thinks so". Patient has some difficulty remembering recent conversations and people names. He attributes it to decreased hearing and age repeats oneself? Denies  Disoriented when walking into a room?  Patient denies except occasionally not remembering what patient came to the room for   Leaving objects in unusual places? "Everyone has it" Keys in the wallet.   Wandering behavior? denies   Any personality changes ? denies   Any history of depression?: denies   Hallucinations or paranoia?  denies   Seizures? denies    Any sleep changes? " Does not sleep well, "thinks about different things and things he needs to do, he worries a lot"-wife says. Once in a while he has some  vivid dreams, REM behavior or sleepwalking. He has been talking in sleep for a long time   Sleep apnea? denies   Any hygiene concerns?  denies   Independent of bathing and dressing?  Endorsed  Does the patient need help with medications?  Wife supervises and places them in a pillbox  Who is in charge of the finances?   Wife is in charge.      Any changes in appetite?   denies     Patient have trouble swallowing?  denies   Does the patient cook?  Any kitchen accidents such as leaving the stove on? Patient denies   Any headaches?  denies   Chronic back pain?  denies  Chronic L shoulder due to rotator cuff tear, followed with Ortho.  Ambulates with difficulty? denies   Recent falls or head injuries? 3/24 fell on [facility] [place] while working and head hit a pile of cables.   Vision changes? Depth perception is not as good as it used to be after stroke L<R.  R is better.  Unilateral weakness,  numbness or tingling?  denies , no residual after the stroke. Patient  never had a similar episode .Denies any history of  TIA. Denies vertigo dizziness. Denies  history of headaches, dysarthria or dysphagia. No confusion or seizures. Denies any chest pain, or shortness of breath. Denies any fever or chills, or night sweats.  No hormonal supplements.  Denies any recent long distance trips or recent surgeries. No sick contacts. No new stressors present in personal life. Patient is compliant with his medications. Patient is very active, exercising daily. No family history of stroke .  Any tremors?  denies   Any anosmia?  denies  Any incontinence of urine? Gets up frequently at night because he needs water and then he goes again.   Any bowel dysfunction? denies      Patient lives with wife. History of heavy alcohol intake? denies   History of heavy tobacco use? denies   Family history of dementia? Father had dementia ?type Does patient drive? No, stopped since the stroke due to issues with depth perception.     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        Past Medical History:  Diagnosis Date   Anemia    Iron deficiency part of this   Cataract    bilateral lens implants   Diabetes mellitus    type II   [name] (HCC)    GERD (gastroesophageal reflux disease) 01/1989   Helicobacter pylori gastritis 06/11/2011   On EGD 05/2011    Hyperlipidemia    Hypertension 11/1999   Internal hemorrhoids    Iron deficiency anemia, unspecified 03/04/2011   MGUS (monoclonal gammopathy of unknown significance)    possible dx initially, had negative f/u   Pancytopenia, acquired (HCC)    Personal history of colonic adenomas 06/05/2012   Pneumonia    Stroke [facility])      Past Surgical History:  Procedure Laterality Date   CATARACT EXTRACTION Bilateral    COLONOSCOPY     ESOPHAGOGASTRODUODENOSCOPY     2012 H. pylori gastritis   FLEXIBLE SIGMOIDOSCOPY  05/1988    internal hemorrhoids   KNEE SURGERY  12/2002   lt knee fx repair ORIF   TIBIA FRACTURE SURGERY     left 2012     PREVIOUS MEDICATIONS:   CURRENT MEDICATIONS:  Outpatient Encounter Medications as of 03/23/2023  Medication Sig   acetaminophen (TYLENOL) 325 MG tablet Take 2 tablets (650 mg total) by mouth every 4 (four) hours as needed for mild pain (or temp > 37.5 C (99.5 F)).   atorvastatin (LIPITOR) 10 MG tablet Take 1 tablet (10 mg total) by mouth at bedtime.   cephALEXin (KEFLEX) 500 MG capsule Take 1 capsule (500 mg total) by mouth 3 (three) times daily.   clopidogrel (PLAVIX) 75 MG tablet Take 1 tablet (75 mg total) by mouth daily.   glimepiride (AMARYL) 2 MG tablet Take 1 tablet (2 mg total) by mouth daily.   IRON-VITAMIN C PO Take by mouth daily. 60 mg tablet   losartan (COZAAR) 100 MG tablet Take 1 tablet (100 mg total) by mouth daily.   metFORMIN (GLUCOPHAGE) 850 MG tablet Take 1 tablet (850 mg total) by mouth 2 (two) times daily.   Multiple Vitamin (MULTIVITAMIN) tablet Take 1 tablet by mouth daily. Centrum Silver   niacin (NIASPAN) 500 MG CR tablet Take 3 tablets (1,500 mg total) by mouth daily.   pioglitazone (ACTOS) 45 MG tablet Take 1 tablet (45 mg total) by mouth daily.   [DISCONTINUED] memantine (NAMENDA) 5 MG tablet Take 1 tablet (5 mg total) by mouth at bedtime.   memantine (NAMENDA) 5 MG tablet Take 1 tablet (5 mg total) by mouth 2 (two) times daily.   No facility-administered encounter medications on file as of 03/23/2023.     Objective:     PHYSICAL EXAMINATION:    VITALS:   Vitals:   03/23/23 0901  BP: (!) 151/60  Pulse: (!) 55  Resp: 20  SpO2: 97%  Weight: 260 lb (117.9 kg)  Height: 6\' 1"  (1.854 m)    GEN:  The patient appears stated age and  is in NAD. HEENT:  Normocephalic, atraumatic.   Neurological examination:  General: NAD, well-groomed, appears stated age. Orientation: The patient is alert. Oriented to person, place and date Cranial  nerves: There is good facial symmetry.The speech is fluent and clear. No aphasia or dysarthria. Fund of knowledge is appropriate. Recent memory impaired and remote memory is normal.  Attention and concentration are normal.  Able to name objects and repeat phrases.  Hearing is decreased to conversational tone.   Delayed recall  3/3 Sensation: Sensation is intact to light touch throughout Motor: Strength is at least antigravity x4. DTR's 2/4 in UE/LE      09/17/2022    1:00 PM  Montreal Cognitive Assessment   Visuospatial/ Executive (0/5) 1  Naming (0/3) 2  Attention: Read list of digits (0/2) 2  Attention: Read list of letters (0/1) 1  Attention: Serial 7 subtraction starting at 100 (0/3) 2  Language: Repeat phrase (0/2) 1  Language : Fluency (0/1) 1  Abstraction (0/2) 2  Delayed Recall (0/5) 2  Orientation (0/6) 5  Total 19  Adjusted Score (based on education) 20       03/23/2023   10:00 AM 02/21/2019    2:03 PM 01/30/2017    9:40 AM  MMSE - Mini Mental State Exam  Orientation to time 5 5 5   Orientation to Place 5 5 5   Registration 3 3 3   Attention/ Calculation 4 5 0  Recall 3 3 3   Language- name 2 objects 2  0  Language- repeat 1 1 1   Language- follow 3 step command 3  3  Language- read & follow direction 1  0  Write a sentence 1  0  Copy design 0  0  Total score 28  20       Movement examination: Tone: There is normal tone in the UE/LE Abnormal movements:  no tremor.  No myoclonus.  No asterixis.   Vision: L hemianopsia, EOMI, no nystagmus. Coordination:  There is no decremation with RAM's. Normal finger to nose  Gait and Station: The patient has no difficulty arising out of a deep-seated chair without the use of the hands. The patient's stride length is good.  Gait is cautious and narrow.   Thank you for allowing [place] the opportunity to participate in the care of this nice patient. Please do not hesitate to contact us for any questions or concerns.   Total time spent  on today's visit was 40 minutes dedicated to this patient today, preparing to see patient, examining the patient, ordering tests and/or medications and counseling the patient, documenting clinical information in the EHR or other health record, independently interpreting results and communicating results to the patient/family, discussing treatment and goals, answering patient's questions and coordinating care.  Cc:  [name], MD  Marlowe Kays 03/23/2023 10:10 AM

## 2023-03-23 NOTE — Patient Instructions (Signed)
Continue Plavix daily, follow with Cardiology  Continue other medications Continue memantine increasing to 5 mg twice a day  Follow up in 6 months

## 2023-03-26 ENCOUNTER — Telehealth: Payer: Self-pay

## 2023-03-26 NOTE — Telephone Encounter (Signed)
Patient is scheduled for left shoulder Arthroscopy on 02/26/24. Received form for anticoagulation. Placed in your box for review.

## 2023-03-29 NOTE — Telephone Encounter (Signed)
Form done, please scan and send.  Thanks.  

## 2023-03-30 NOTE — Telephone Encounter (Signed)
Form has been faxed and sent to scan.

## 2023-04-13 ENCOUNTER — Telehealth: Payer: Self-pay | Admitting: Family Medicine

## 2023-04-13 NOTE — Telephone Encounter (Signed)
Copied from CRM [id number]. Topic: Medical Record Request - Other >> Apr 13, 2023  8:54 AM Orinda Kenner C wrote: Reason for CRM: Lawson Fiscal from [facility] regarding anticoagulant [phone] faxed was sent on 03/26/23 and has not heard response. Pls c/b.  Received CRM regarding patient, have we received a fax from hospital regarding anticoagulant?

## 2023-04-13 NOTE — Telephone Encounter (Signed)
Spoke with Lawson Fiscal, did advise that we did not receive fax in reference to clearance. She will refax, once I receive I will fax back

## 2023-04-24 ENCOUNTER — Ambulatory Visit (INDEPENDENT_AMBULATORY_CARE_PROVIDER_SITE_OTHER)
Admission: RE | Admit: 2023-04-24 | Discharge: 2023-04-24 | Disposition: A | Discharge status: Home / Self Care | Payer: Medicare HMO | Source: Ambulatory Visit | Attending: Family

## 2023-04-24 ENCOUNTER — Ambulatory Visit (INDEPENDENT_AMBULATORY_CARE_PROVIDER_SITE_OTHER): Payer: Medicare HMO | Admitting: Family

## 2023-04-24 ENCOUNTER — Encounter: Payer: Self-pay | Admitting: Family

## 2023-04-24 ENCOUNTER — Other Ambulatory Visit: Payer: Self-pay | Admitting: Family

## 2023-04-24 VITALS — BP 124/62 | HR 65 | Temp 97.9°F | Ht 73.0 in | Wt 257.2 lb

## 2023-04-24 DIAGNOSIS — R062 Wheezing: Secondary | ICD-10-CM

## 2023-04-24 DIAGNOSIS — J22 Unspecified acute lower respiratory infection: Secondary | ICD-10-CM | POA: Diagnosis not present

## 2023-04-24 DIAGNOSIS — B9689 Other specified bacterial agents as the cause of diseases classified elsewhere: Secondary | ICD-10-CM | POA: Diagnosis not present

## 2023-04-24 DIAGNOSIS — R918 Other nonspecific abnormal finding of lung field: Secondary | ICD-10-CM | POA: Diagnosis not present

## 2023-04-24 MED ORDER — ALBUTEROL SULFATE HFA 108 (90 BASE) MCG/ACT IN AERS
2.0000 | INHALATION_SPRAY | Freq: Four times a day (QID) | RESPIRATORY_TRACT | 0 refills | Status: DC | PRN
Start: 2023-04-24 — End: 2023-12-09

## 2023-04-24 MED ORDER — AMOXICILLIN-POT CLAVULANATE 875-125 MG PO TABS: 1.0000 | ORAL_TABLET | Freq: Two times a day (BID) | ORAL | 0 refills | Status: DC

## 2023-04-24 MED ORDER — DOXYCYCLINE HYCLATE 100 MG PO TABS: 100.0000 mg | ORAL_TABLET | Freq: Two times a day (BID) | ORAL | 0 refills | Status: AC

## 2023-04-24 NOTE — Progress Notes (Signed)
 Established Patient Office Visit  Subjective:   Patient ID: Dennis Wise, male    DOB: 1950-12-13  Age: 73 y.o. MRN: [id number]  CC:  Chief Complaint  Patient presents with   Acute Visit    Reports cough and wheezing x1 week. Cough is producing clear mucus. Denies fever, chills, sore throat. Has been taking Mucinex with minimal relief.    HPI: Dennis Wise is a 73 y.o. male presenting on 04/24/2023 for Acute Visit (Reports cough and wheezing x1 week. Cough is producing clear mucus. Denies fever, chills, sore throat. Has been taking Mucinex with minimal relief.)  About one week ago started with a cough. Sometimes productive but not always. Usually a dry hacking cough, but wife hears wheezing. He states at times feels as though he isn't getting enough air in and feels sob. He does better sitting up than lying down. No fever. With chest congestion.   No sore throat, ear pain and or nasal congestion Taking cough drops no real relief.  Taking mucinex as well with some relief.       ROS: Negative unless specifically indicated above in HPI.   Relevant past medical history reviewed and updated as indicated.   Allergies and medications reviewed and updated.   Current Outpatient Medications:    acetaminophen  (TYLENOL ) 325 MG tablet, Take 2 tablets (650 mg total) by mouth every 4 (four) hours as needed for mild pain (or temp > 37.5 C (99.5 F))., Disp: 20 tablet, Rfl: 0   albuterol  (VENTOLIN  HFA) 108 (90 Base) MCG/ACT inhaler, Inhale 2 puffs into the lungs every 6 (six) hours as needed for wheezing or shortness of breath., Disp: 8 g, Rfl: 0   atorvastatin  (LIPITOR) 10 MG tablet, Take 1 tablet (10 mg total) by mouth at bedtime., Disp: 90 tablet, Rfl: 10   clopidogrel  (PLAVIX ) 75 MG tablet, Take 1 tablet (75 mg total) by mouth daily., Disp: 90 tablet, Rfl: 3   doxycycline  (VIBRA -TABS) 100 MG tablet, Take 1 tablet (100 mg total) by mouth 2 (two) times daily for 10 days., Disp: 20 tablet, Rfl:  0   glimepiride  (AMARYL ) 2 MG tablet, Take 1 tablet (2 mg total) by mouth daily., Disp: 90 tablet, Rfl: 10   IRON -VITAMIN C PO, Take by mouth daily. 60 mg tablet, Disp: , Rfl:    losartan  (COZAAR ) 100 MG tablet, Take 1 tablet (100 mg total) by mouth daily., Disp: 90 tablet, Rfl: 3   memantine  (NAMENDA ) 5 MG tablet, Take 1 tablet (5 mg total) by mouth 2 (two) times daily., Disp: 60 tablet, Rfl: 11   metFORMIN  (GLUCOPHAGE ) 850 MG tablet, Take 1 tablet (850 mg total) by mouth 2 (two) times daily., Disp: 180 tablet, Rfl: 10   Multiple Vitamin (MULTIVITAMIN) tablet, Take 1 tablet by mouth daily. Centrum Silver, Disp: , Rfl:    niacin  (NIASPAN ) 500 MG CR tablet, Take 3 tablets (1,500 mg total) by mouth daily., Disp: , Rfl:    pioglitazone  (ACTOS ) 45 MG tablet, Take 1 tablet (45 mg total) by mouth daily., Disp: 90 tablet, Rfl: 10  Allergies  Allergen Reactions   Hydralazine      Short of breath.     Promethazine Hcl     REACTION: AGITIATION    Objective:   BP 124/62 (BP Location: Right Arm, Patient Position: Sitting, Cuff Size: Large)   Pulse 65   Temp 97.9 F (36.6 C) (Temporal)   Ht 6' 1 (1.854 m)   Wt 257 lb 3.2 oz (116.7  kg)   SpO2 99%   BMI 33.93 kg/m    Physical Exam Vitals reviewed.  Constitutional:      General: He is not in acute distress.    Appearance: Normal appearance. He is obese. He is not ill-appearing, toxic-appearing or diaphoretic.  HENT:     Head: Normocephalic.     Right Ear: Tympanic membrane normal.     Left Ear: Tympanic membrane normal.     Nose: Nose normal.     Mouth/Throat:     Mouth: Mucous membranes are moist.  Eyes:     Pupils: Pupils are equal, round, and reactive to light.  Cardiovascular:     Rate and Rhythm: Normal rate and regular rhythm.  Pulmonary:     Effort: Pulmonary effort is normal.     Breath sounds: Examination of the right-upper field reveals wheezing. Examination of the left-upper field reveals wheezing. Examination of the  right-lower field reveals wheezing. Wheezing present.  Musculoskeletal:        General: Normal range of motion.     Cervical back: Normal range of motion.  Neurological:     General: No focal deficit present.     Mental Status: He is alert and oriented to person, place, and time. Mental status is at baseline.  Psychiatric:        Mood and Affect: Mood normal.        Behavior: Behavior normal.        Thought Content: Thought content normal.        Judgment: Judgment normal.     Assessment & Plan:  Wheezing -     Albuterol  Sulfate HFA; Inhale 2 puffs into the lungs every 6 (six) hours as needed for wheezing or shortness of breath.  Dispense: 8 g; Refill: 0 -     DG Chest 2 View; Future  Bacterial lower respiratory infection Assessment & Plan: RX doxycycline  100 mg po bid x 10 days Chest xray stat pending results r/o pneumonia Albuterol  prn sob Will hold on prednisone due to diabetes but certainly can consider if sob and chest tightness not improving If pneumonia will add augmentin    Orders: -     Doxycycline  Hyclate; Take 1 tablet (100 mg total) by mouth 2 (two) times daily for 10 days.  Dispense: 20 tablet; Refill: 0 -     DG Chest 2 View; Future     Follow up plan: Return if symptoms worsen or fail to improve.  Ginger Patrick, FNP

## 2023-04-24 NOTE — Assessment & Plan Note (Signed)
 RX doxycycline 100 mg po bid x 10 days Chest xray stat pending results r/o pneumonia Albuterol prn sob Will hold on prednisone due to diabetes but certainly can consider if sob and chest tightness not improving If pneumonia will add augmentin

## 2023-04-27 DIAGNOSIS — I071 Rheumatic tricuspid insufficiency: Secondary | ICD-10-CM | POA: Diagnosis not present

## 2023-04-27 DIAGNOSIS — I6523 Occlusion and stenosis of bilateral carotid arteries: Secondary | ICD-10-CM | POA: Diagnosis not present

## 2023-04-27 DIAGNOSIS — I10 Essential (primary) hypertension: Secondary | ICD-10-CM | POA: Diagnosis not present

## 2023-04-27 DIAGNOSIS — E782 Mixed hyperlipidemia: Secondary | ICD-10-CM | POA: Diagnosis not present

## 2023-04-27 DIAGNOSIS — R001 Bradycardia, unspecified: Secondary | ICD-10-CM | POA: Diagnosis not present

## 2023-04-29 IMAGING — US US ABDOMEN COMPLETE
1 series · 14 of 25 positions shown · non-contrast
Comparison: None.

CLINICAL DATA: Thrombocytopenia, elevated LFTs

EXAM:
ABDOMEN ULTRASOUND COMPLETE

[Series 1: us abdomen complete · 14 of 101 slices shown]
[im 1/101]
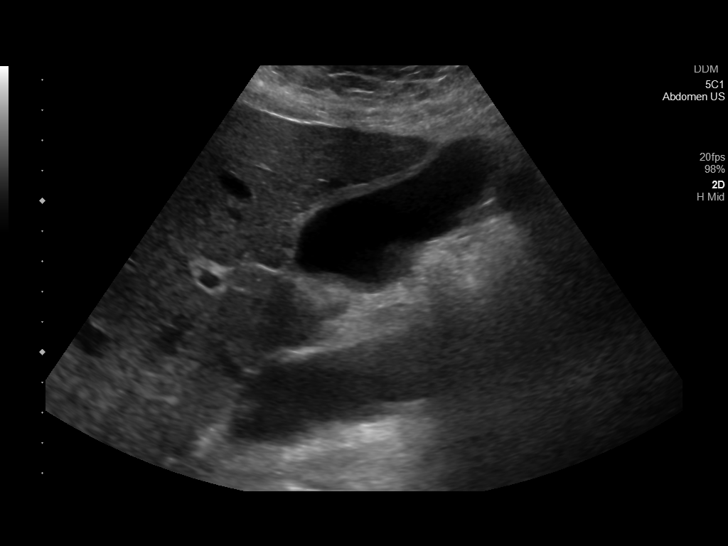
[im 9/101]
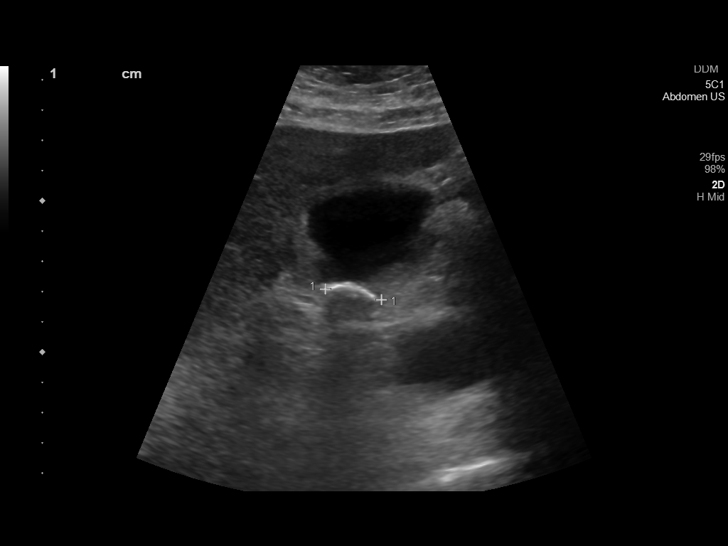
[im 17/101]
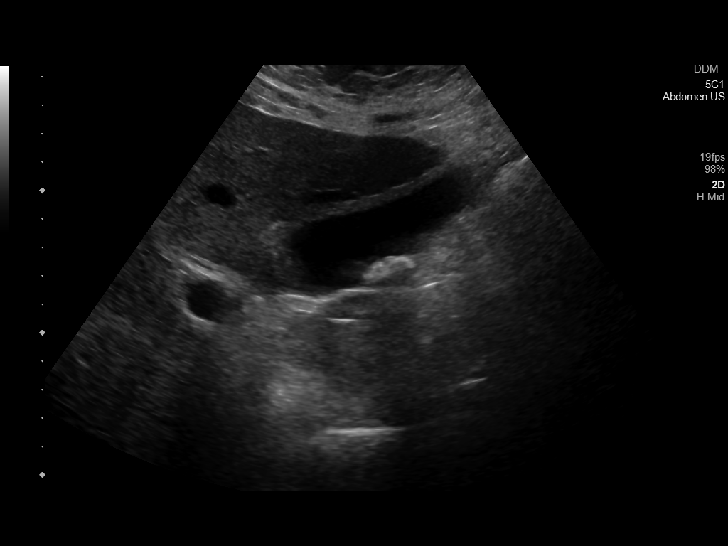
[im 26/101]
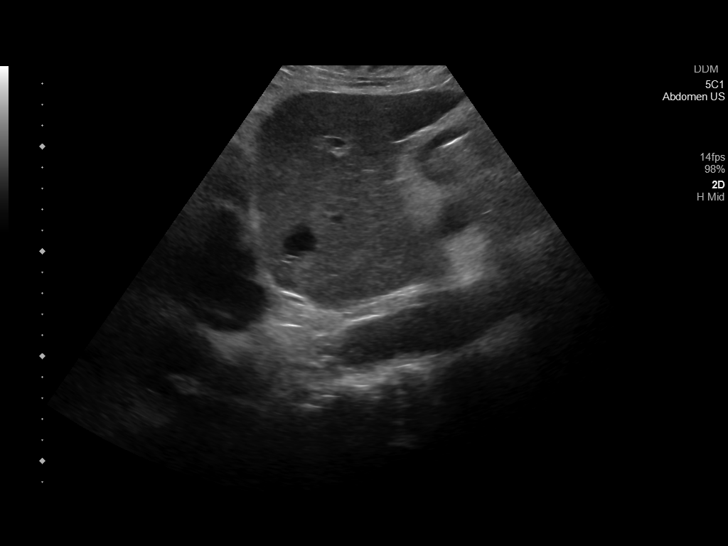
[im 34/101]
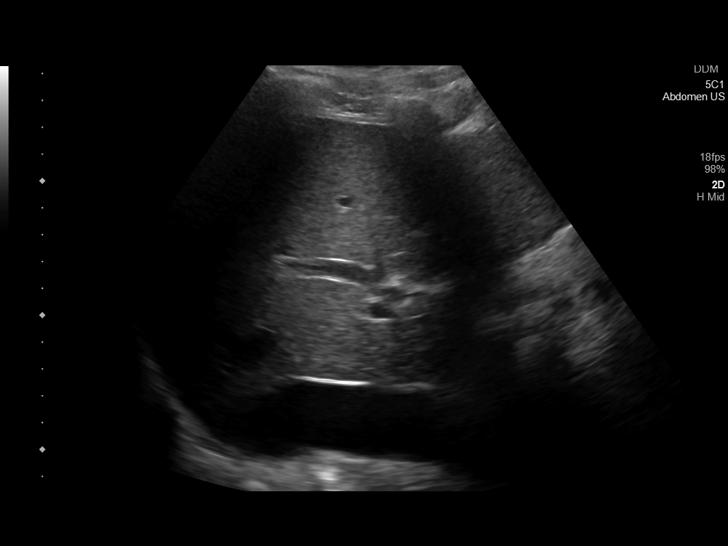
[im 38/101]
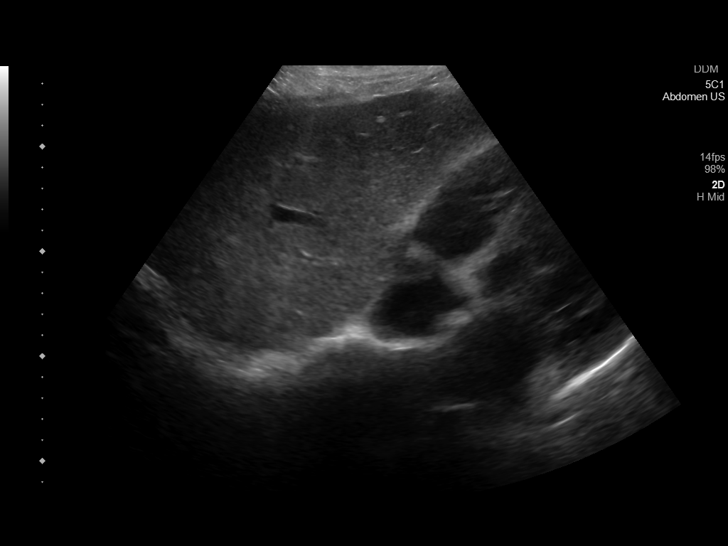
[im 46/101]
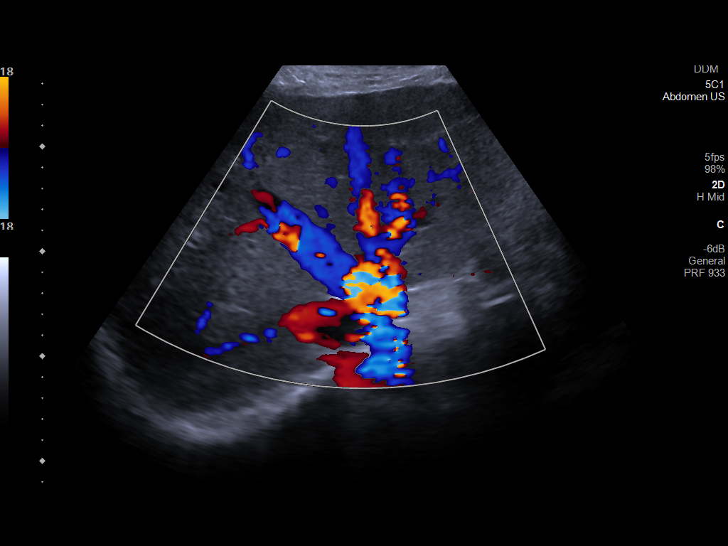
[im 55/101]
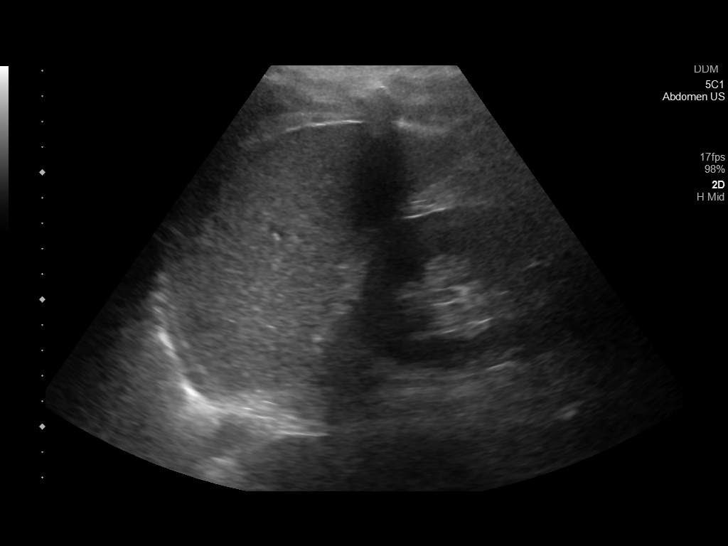
[im 63/101]
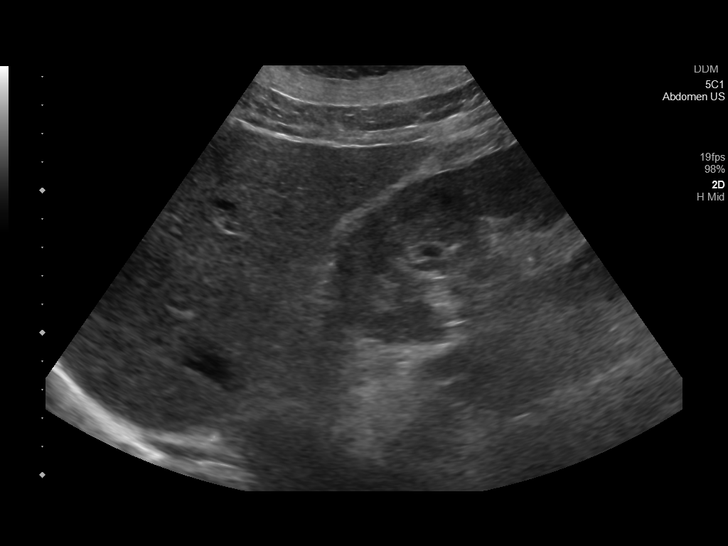
[im 67/101]
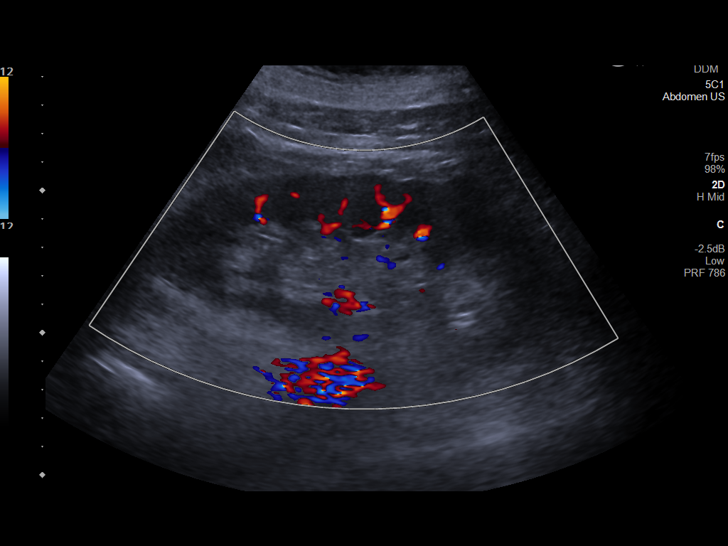
[im 76/101]
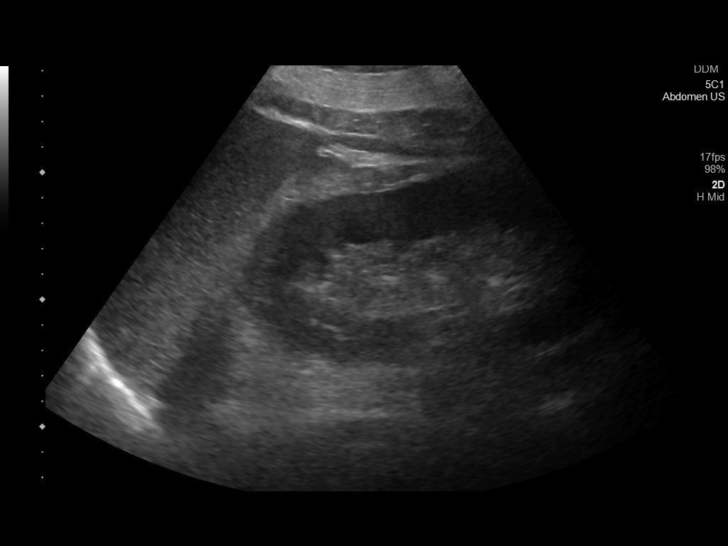
[im 84/101]
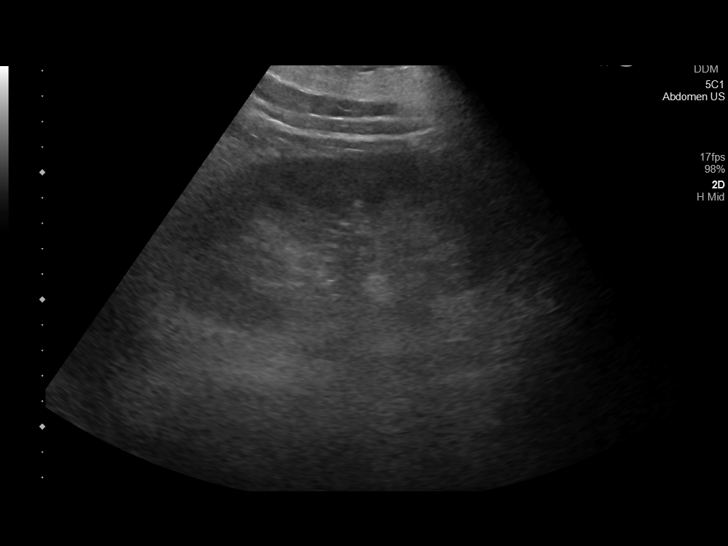
[im 92/101]
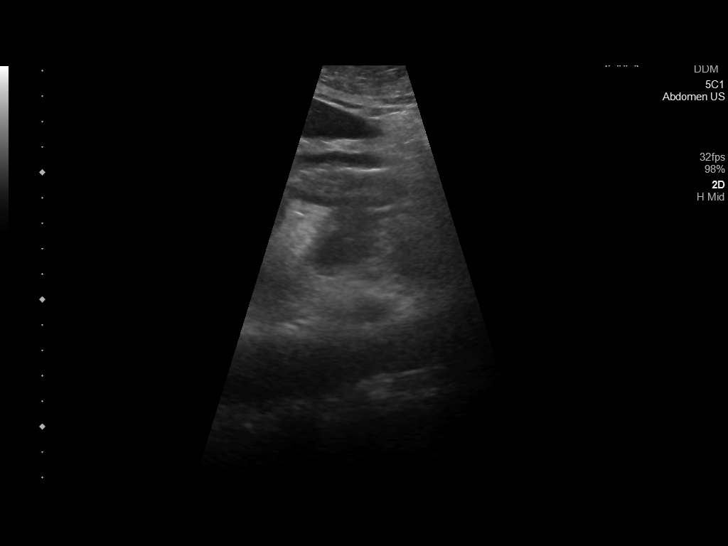
[im 101/101]
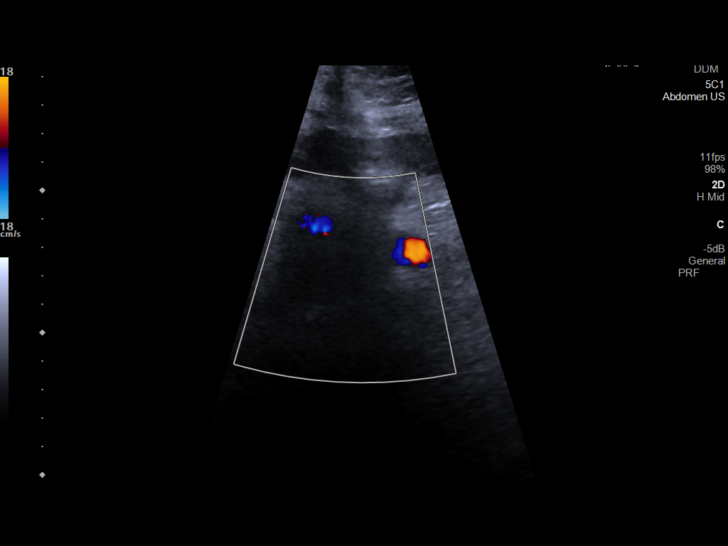

[14 of 25 positions shown; findings below may reference images not displayed]

FINDINGS: Gallbladder: 2 cm gallstone within the gallbladder. No wall
thickening or sonographic Murphy sign.

Common bile duct: Diameter: Normal caliber, 2 mm

Liver: Increased echotexture compatible with fatty infiltration. No
focal abnormality or biliary ductal dilatation. Portal vein is
patent on color Doppler imaging with normal direction of blood flow
towards the liver.

IVC: No abnormality visualized.

Pancreas: Visualized portion unremarkable.

Spleen: Size and appearance within normal limits.

Right Kidney: Length: 14 cm. Echogenicity within normal limits. No
mass or hydronephrosis visualized.

Left Kidney: Length: 14.7 cm. Echogenicity within normal limits. No
mass or hydronephrosis visualized.

Abdominal aorta: No aneurysm visualized.

Other findings: Trace right pleural effusion seen.
IMPRESSION: Hepatic steatosis.

Cholelithiasis.  No sonographic evidence of acute cholecystitis.

Trace right pleural effusion.

## 2023-05-05 ENCOUNTER — Ambulatory Visit (INDEPENDENT_AMBULATORY_CARE_PROVIDER_SITE_OTHER): Payer: Medicare HMO

## 2023-05-05 VITALS — BP 126/82 | Ht 73.0 in | Wt 264.6 lb

## 2023-05-05 DIAGNOSIS — Z0000 Encounter for general adult medical examination without abnormal findings: Secondary | ICD-10-CM | POA: Diagnosis not present

## 2023-05-05 NOTE — Patient Instructions (Signed)
 [name] , Thank you for taking time to come for your Medicare Wellness Visit. I appreciate your ongoing commitment to your health goals. Please review the following plan we discussed and let me know if I can assist you in the future.   Referrals/Orders/Follow-Ups/Clinician Recommendations: none  This is a list of the screening recommended for you and due dates:  Health Maintenance  Topic Date Due   Zoster (Shingles) Vaccine (1 of 2) 07/06/1967   DTaP/Tdap/Td vaccine (3 - Tdap) 10/17/2019   Complete foot exam   10/19/2023*   Hemoglobin A1C  07/01/2023   Eye exam for diabetics  12/09/2023   Yearly kidney function blood test for diabetes  02/19/2024   Yearly kidney health urinalysis for diabetes  02/19/2024   Medicare Annual Wellness Visit  05/04/2024   Pneumonia Vaccine  Completed   Flu Shot  Completed   Hepatitis C Screening  Completed   HPV Vaccine  Aged Out   Colon Cancer Screening  Discontinued   COVID-19 Vaccine  Discontinued  *Topic was postponed. The date shown is not the original due date.    Advanced directives: (Copy Requested) Please bring a copy of your health care power of attorney and living will to the office to be added to your chart at your convenience.  Next Medicare Annual Wellness Visit scheduled for next year: Yes 05/06/2024 @ 10:10am in person

## 2023-05-05 NOTE — Progress Notes (Signed)
 Subjective:   Dennis Wise is a 73 y.o. male who presents for Medicare Annual/Subsequent preventive examination.  Visit Complete: In person  Patient Medicare AWV questionnaire was completed by the patient on 05/04/2023; I have confirmed that all information answered by patient is correct and no changes since this date.  Cardiac Risk Factors include: advanced age (>73men, >53 women);dyslipidemia;diabetes mellitus;hypertension;male gender;obesity (BMI >30kg/m2)    Objective:    Today's Vitals   05/04/23 1627 05/05/23 1054  BP:  126/82  Weight:  264 lb 9.6 oz (120 kg)  Height:  6' 1 (1.854 m)  PainSc: 6  6   PainLoc:  Shoulder   Body mass index is 34.91 kg/m.     05/05/2023   11:10 AM 03/23/2023    9:02 AM 02/04/2023    9:59 AM 09/17/2022    1:16 PM 09/05/2022    2:07 PM 08/04/2022    1:37 PM 05/13/2022    3:09 PM  Advanced Directives  Does Patient Have a Medical Advance Directive? Yes Yes Yes Yes Yes Yes Yes  Type of Estate Agent of [place];Living will Healthcare Power of [place];Living will Healthcare Power of [place] of [place];Living will Healthcare Power of [place];Living will Healthcare Power of [place];Living will  Does patient want to make changes to medical advance directive?  No - Patient declined  No - Patient declined No - Patient declined    Copy of Healthcare Power of Attorney in Chart? No - copy requested No - copy requested No - copy requested No - copy requested No - copy requested No - copy requested No - copy requested    Current Medications (verified) Outpatient Encounter Medications as of 05/05/2023  Medication Sig   acetaminophen  (TYLENOL ) 325 MG tablet Take 2 tablets (650 mg total) by mouth every 4 (four) hours as needed for mild pain (or temp > 37.5 C (99.5 F)).   albuterol  (VENTOLIN  HFA) 108 (90 Base) MCG/ACT inhaler Inhale 2 puffs into the lungs every 6 (six) hours as needed for  wheezing or shortness of breath.   amoxicillin -clavulanate (AUGMENTIN ) 875-125 MG tablet Take 1 tablet by mouth 2 (two) times daily.   atorvastatin  (LIPITOR) 10 MG tablet Take 1 tablet (10 mg total) by mouth at bedtime.   clopidogrel  (PLAVIX ) 75 MG tablet Take 1 tablet (75 mg total) by mouth daily.   glimepiride  (AMARYL ) 2 MG tablet Take 1 tablet (2 mg total) by mouth daily.   IRON -VITAMIN C PO Take by mouth daily. 60 mg tablet   losartan  (COZAAR ) 100 MG tablet Take 1 tablet (100 mg total) by mouth daily.   memantine  (NAMENDA ) 5 MG tablet Take 1 tablet (5 mg total) by mouth 2 (two) times daily.   metFORMIN  (GLUCOPHAGE ) 850 MG tablet Take 1 tablet (850 mg total) by mouth 2 (two) times daily.   Multiple Vitamin (MULTIVITAMIN) tablet Take 1 tablet by mouth daily. Centrum Silver   niacin  (NIASPAN ) 500 MG CR tablet Take 3 tablets (1,500 mg total) by mouth daily.   pioglitazone  (ACTOS ) 45 MG tablet Take 1 tablet (45 mg total) by mouth daily.   No facility-administered encounter medications on file as of 05/05/2023.    Allergies (verified) Hydralazine  and Promethazine hcl   History: Past Medical History:  Diagnosis Date   Anemia    Iron  deficiency part of this   Cataract    bilateral lens implants   Diabetes mellitus    type II   [name] (  HCC)    GERD (gastroesophageal reflux disease) 01/1989   Helicobacter pylori gastritis 06/11/2011   On EGD 05/2011    Hyperlipidemia    Hypertension 11/1999   Internal hemorrhoids    Iron  deficiency anemia, unspecified 03/04/2011   MGUS (monoclonal gammopathy of unknown significance)    possible dx initially, had negative f/u   Pancytopenia, acquired (HCC)    Personal history of colonic adenomas 06/05/2012   Pneumonia    Stroke [facility])    Past Surgical History:  Procedure Laterality Date   CATARACT EXTRACTION Bilateral    COLONOSCOPY     ESOPHAGOGASTRODUODENOSCOPY     2012 H. pylori gastritis   FLEXIBLE SIGMOIDOSCOPY  05/1988   internal  hemorrhoids   KNEE SURGERY  12/2002   lt knee fx repair ORIF   TIBIA FRACTURE SURGERY     left 2012   Family History  Problem Relation Age of Onset   Cancer Mother        ?   Diabetes Mother    Heart failure Mother    Emphysema Father    Cancer Father        ?   Alzheimer's disease Father    Skin cancer Brother    Diabetes Brother    Colon cancer Neg Hx    Prostate cancer Neg Hx    Esophageal cancer Neg Hx    Rectal cancer Neg Hx    Stomach cancer Neg Hx    Social History   Socioeconomic History   Marital status: Married    Spouse name: Not on file   Number of children: 1   Years of education: Not on file   Highest education level: 12th grade  Occupational History   Not on file  Tobacco Use   Smoking status: Never   Smokeless tobacco: Never  Vaping Use   Vaping status: Never Used  Substance and Sexual Activity   Alcohol  use: Never   Drug use: No   Sexual activity: Yes    Birth control/protection: None  Other Topics Concern   Not on file  Social History Narrative   Prev traveling for sports broadcasting    Working for [employer], [employer] etc as of 2019   Married 1970   1 child   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   Right handed   One floor home   Drinks caffeine   Lives with wife   Social Drivers of Health   Financial Resource Strain: Low Risk  (05/05/2023)   Overall Financial Resource Strain (CARDIA)    Difficulty of Paying Living Expenses: Not hard at all  Food Insecurity: No Food Insecurity (05/05/2023)   Hunger Vital Sign    Worried About Running Out of Food in the Last Year: Never true    Ran Out of Food in the Last Year: Never true  Transportation Needs: No Transportation Needs (05/05/2023)   PRAPARE - Administrator, Civil Service (Medical): No    Lack of Transportation (Non-Medical): No  Physical Activity: Sufficiently Active (05/05/2023)   Exercise Vital Sign    Days of Exercise per Week: 7 days    Minutes of Exercise per Session: 30 min   Recent Concern: Physical Activity - Insufficiently Active (03/11/2023)   Exercise Vital Sign    Days of Exercise per Week: 6 days    Minutes of Exercise per Session: 10 min  Stress: No Stress Concern Present (05/05/2023)   [employer] of Occupational Health - Occupational Stress Questionnaire  Feeling of Stress : Only a little  Social Connections: Moderately Integrated (05/05/2023)   Social Connection and Isolation Panel [NHANES]    Frequency of Communication with Friends and Family: More than three times a week    Frequency of Social Gatherings with Friends and Family: Once a week    Attends Religious Services: More than 4 times per year    Active Member of [employer] or Organizations: No    Attends Banker Meetings: Never    Marital Status: Married  Recent Concern: Social Connections - Moderately Isolated (03/11/2023)   Social Connection and Isolation Panel [NHANES]    Frequency of Communication with Friends and Family: Three times a week    Frequency of Social Gatherings with Friends and Family: Twice a week    Attends Religious Services: Never    Database Administrator or Organizations: No    Attends Engineer, Structural: Never    Marital Status: Married    Tobacco Counseling Counseling given: Not Answered  Clinical Intake:  Pre-visit preparation completed: Yes  Pain : 0-10 Pain Score: 6  Pain Type: Chronic pain Pain Location: Shoulder Pain Orientation: Left Pain Descriptors / Indicators: Aching Pain Onset: More than a month ago Pain Frequency: Intermittent Pain Relieving Factors: Tylenol , heat  Pain Relieving Factors: Tylenol , heat  BMI - recorded: 34.91 Nutritional Status: BMI > 30  Obese Nutritional Risks: None Diabetes: Yes CBG done?: No Did pt. bring in CBG monitor from home?: No  How often do you need to have someone help you when you read instructions, pamphlets, or other written materials from your doctor or pharmacy?: 1 -  Never  Interpreter Needed?: No  Comments: lives with wife Information entered by :: B.Larisa Lanius,LPN  Activities of Daily Living    05/04/2023    4:27 PM 09/05/2022    2:12 PM  In your present state of health, do you have any difficulty performing the following activities:  Hearing? 1   Difficulty concentrating or making decisions? 0   Walking or climbing stairs? 0   Dressing or bathing? 0   Doing errands, shopping? 1 0  Preparing Food and eating ? N   Using the Toilet? N   In the past six months, have you accidently leaked urine? N   Do you have problems with loss of bowel control? N   Managing your Medications? N   Managing your Finances? N   Housekeeping or managing your Housekeeping? N     Patient Care Team: [name], MD as PCP - General (Family Medicine) Norleen Sal Dickinson, MD as Referring Physician (Ophthalmology) Odean Potts, MD as Consulting Physician (Hematology and Oncology) Myra Rosaline FALCON, [facility] (Inactive) as Pharmacist (Pharmacist) Babara Call, MD as Consulting Physician (Oncology)  Indicate any recent Medical Services you may have received from other than [facility] providers in the past year (date may be approximate).     Assessment:   This is a routine wellness examination for [facility].  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  COMPLETED: Patient Stated   On track    02/21/2019, I will continue to eat a healthier diet and exercise more daily.      COMPLETED: Patient Stated   On track    Would like to drink more water  and watch diet.     COMPLETED: Patient Stated   On track    Lose a little  weight      Patient Stated       I would like to get shoulder done and get back to work       Depression Screen    05/05/2023   11:03 AM 03/13/2023    3:28 PM 02/26/2023    9:28 AM 01/01/2023   11:36 AM 09/30/2022   11:33 AM 09/01/2022   10:03 AM 06/30/2022   10:03 AM  PHQ 2/9 Scores  PHQ - 2 Score 1 0 1 0 3 6 0  PHQ- 9 Score 1 0 4 1 11 19  0    Fall Risk    05/04/2023    4:27 PM 03/23/2023    9:02 AM 03/13/2023    3:28 PM 02/26/2023    9:28 AM 01/01/2023   11:36 AM  Fall Risk   Falls in the past year? 1 0 1 1 1   Number falls in past yr: 1 1 1 1 1   Injury with Fall? 1 1 0 0 1  Risk for fall due to : History of fall(s);Impaired balance/gait;Impaired mobility  History of fall(s) History of fall(s)   Follow up Education provided;Falls prevention discussed Falls evaluation completed Falls evaluation completed Falls evaluation completed     MEDICARE RISK AT HOME: Medicare Risk at Home Any stairs in or around the home?: (Patient-Rptd) Yes If so, are there any without handrails?: (Patient-Rptd) No Home free of loose throw rugs in walkways, pet beds, electrical cords, etc?: (Patient-Rptd) Yes Adequate lighting in your home to reduce risk of falls?: (Patient-Rptd) Yes Life alert?: (Patient-Rptd) No Use of a cane, walker or w/c?: (Patient-Rptd) Yes Grab bars in the bathroom?: (Patient-Rptd) Yes Shower chair or bench in shower?: (Patient-Rptd) No Elevated toilet seat or a handicapped toilet?: (Patient-Rptd) Yes  TIMED UP AND GO:  Was the test performed?  Yes  Length of time to ambulate 10 feet: 18 sec Gait slow and steady with assistive device    Cognitive Function:    03/23/2023   10:00 AM 02/21/2019    2:03 PM 01/30/2017    9:40 AM 10/22/2015    8:21 AM  MMSE - Mini Mental State Exam  Orientation to time 5 5 5 5   Orientation to Place 5 5 5 5   Registration 3 3 3 3   Attention/ Calculation 4 5 0 0  Recall 3 3 3 3   Language- name 2 objects 2  0 0  Language- repeat 1 1 1 1    Language- follow 3 step command 3  3 3   Language- read & follow direction 1  0 0  Write a sentence 1  0 0  Copy design 0  0 0  Total score 28  20 20       09/17/2022    1:00 PM  Montreal Cognitive Assessment   Visuospatial/ Executive (0/5) 1  Naming (0/3) 2  Attention: Read list of digits (0/2) 2  Attention: Read list of letters (0/1) 1  Attention: Serial 7 subtraction starting at 100 (0/3) 2  Language: Repeat phrase (0/2) 1  Language : Fluency (0/1) 1  Abstraction (0/2) 2  Delayed Recall (0/5)  2  Orientation (0/6) 5  Total 19  Adjusted Score (based on education) 20      05/05/2023   11:14 AM 05/13/2022    3:12 PM  6CIT Screen  What Year? 0 points 0 points  What month? 0 points 0 points  What time? 0 points 0 points  Count back from 20 0 points 0 points  Months in reverse 0 points 0 points  Repeat phrase 0 points 0 points  Total Score 0 points 0 points    Immunizations Immunization History  Administered Date(s) Administered   Fluad Quad(high Dose 65+) 02/28/2019, 03/01/2020, 03/11/2021, 03/17/2022   Fluad Trivalent(High Dose 65+) 01/01/2023   Influenza Split 03/03/2011, 02/03/2012   Influenza,inj,Quad PF,6+ Mos 02/28/2013, 02/20/2015, 05/12/2016, 01/30/2017, 02/09/2018   PFIZER(Purple Top)SARS-COV-2 Vaccination 05/31/2019, 06/21/2019, 03/26/2020   Pneumococcal Conjugate-13 10/14/2013   Pneumococcal Polysaccharide-23 08/22/1998, 03/03/2011, 01/30/2017   Td 08/22/1998, 10/16/2009   Zoster, Live 02/28/2013    TDAP status: Up to date  Flu Vaccine status: Up to date  Pneumococcal vaccine status: Up to date  Covid-19 vaccine status: Completed vaccines  Qualifies for Shingles Vaccine? Yes   Zostavax completed No   Shingrix Completed?: No.    Education has been provided regarding the importance of this vaccine. Patient has been advised to call insurance company to determine out of pocket expense if they have not yet received this vaccine. Advised may also receive  vaccine at local pharmacy or Health Dept. Verbalized acceptance and understanding.  Screening Tests Health Maintenance  Topic Date Due   Zoster Vaccines- Shingrix (1 of 2) 07[birth year]06/1969   DTaP/Tdap/Td (3 - Tdap) 10/17/2019   FOOT EXAM  10/19/2023 (Originally 03/18/2023)   HEMOGLOBIN A1C  07/01/2023   OPHTHALMOLOGY EXAM  12/09/2023   Diabetic kidney evaluation - eGFR measurement  02/19/2024   Diabetic kidney evaluation - Urine ACR  02/19/2024   Medicare Annual Wellness (AWV)  05/04/2024   Pneumonia Vaccine 33+ Years old  Completed   INFLUENZA VACCINE  Completed   Hepatitis C Screening  Completed   HPV VACCINES  Aged Out   Colonoscopy  Discontinued   COVID-19 Vaccine  Discontinued    Health Maintenance  Health Maintenance Due  Topic Date Due   Zoster Vaccines- Shingrix (1 of 2) 07[birth year]06/1967   DTaP/Tdap/Td (3 - Tdap) 10/17/2019    Colorectal cancer screening: No longer required. DISCONTINUED per chart  Lung Cancer Screening: (Low Dose CT Chest recommended if Age 56-80 years, 20 pack-year currently smoking OR have quit w/in 15years.) does not qualify.   Lung Cancer Screening Referral: no  Additional Screening:  Hepatitis C Screening: does not qualify; Completed 10/16/2015  Vision Screening: Recommended annual ophthalmology exams for early detection of glaucoma and other disorders of the eye. Is the patient up to date with their annual eye exam?  Yes  Who is the provider or what is the name of the office in which the patient attends annual eye exams? Dr Grevin If pt is not established with a provider, would they like to be referred to a provider to establish care? No .   Dental Screening: Recommended annual dental exams for proper oral hygiene  Diabetic Foot Exam: Diabetic Foot Exam: Overdue, Pt has been advised about the importance in completing this exam. Pt is scheduled for diabetic foot exam on 05/29/23 w/PCP visit.  Community Resource Referral / Chronic Care Management: CRR  required this visit?  Yes   CCM required this visit?  No    Plan:  I have personally reviewed and noted the following in the patient's chart:   Medical and social history Use of alcohol , tobacco or illicit drugs  Current medications and supplements including opioid prescriptions. Patient is not currently taking opioid prescriptions. Functional ability and status Nutritional status Physical activity Advanced directives List of other physicians Hospitalizations, surgeries, and ER visits in previous 12 months Vitals Screenings to include cognitive, depression, and falls Referrals and appointments  In addition, I have reviewed and discussed with patient certain preventive protocols, quality metrics, and best practice recommendations. A written personalized care plan for preventive services as well as general preventive health recommendations were provided to patient.    Erminio LITTIE Saris, LPN   [id number]   After Visit Summary: (MyChart) Due to this being a telephonic visit, the after visit summary with patients personalized plan was offered to patient via MyChart   Nurse Notes: Pt presents to visit ambulating w/cane escorted by his wife, Nena. pt talks strongly about getting his shoulder fixed and returning to work. Pt speaks of being :over: the stroke he had in May 2024 and adamantly expresses wanting to return to work for social, financial and mental reasons. His wife does not concur with his plan and expectation saying he should not return to work due to his vision not being good enough to drive or work. Pt says he is working hard to regain his previous lifestyle.He has no concerns or questions at this time.

## 2023-05-07 ENCOUNTER — Ambulatory Visit: Payer: Medicare HMO | Admitting: Psychology

## 2023-05-07 ENCOUNTER — Encounter: Payer: Self-pay | Admitting: Psychology

## 2023-05-07 DIAGNOSIS — H53462 Homonymous bilateral field defects, left side: Secondary | ICD-10-CM

## 2023-05-07 DIAGNOSIS — I63531 Cerebral infarction due to unspecified occlusion or stenosis of right posterior cerebral artery: Secondary | ICD-10-CM | POA: Diagnosis not present

## 2023-05-07 DIAGNOSIS — I999 Unspecified disorder of circulatory system: Secondary | ICD-10-CM | POA: Diagnosis not present

## 2023-05-07 DIAGNOSIS — F0670 Mild neurocognitive disorder due to known physiological condition without behavioral disturbance: Secondary | ICD-10-CM

## 2023-05-07 DIAGNOSIS — R4189 Other symptoms and signs involving cognitive functions and awareness: Secondary | ICD-10-CM

## 2023-05-07 HISTORY — DX: Unspecified disorder of circulatory system: I99.9

## 2023-05-07 NOTE — Progress Notes (Signed)
NEUROPSYCHOLOGICAL EVALUATION [place]. [facility] Department of Neurology  Date of Evaluation: May 07, 2023  Reason for Referral:   TOREN SASSER is a 73 y.o. right-handed Caucasian male referred by Marlowe Kays, PA-C, to characterize his current cognitive functioning and assist with diagnostic clarity and treatment planning in the context of a right PCA infarct this past May and concern surrounding residual cognitive dysfunction.   Assessment and Plan:   Clinical Impression(s): Mr. Blane pattern of performance is suggestive of severe impairment surrounding visuospatial/visuoconstructional abilities. Impairment was also exhibited across processing speed; however, impaired performances were directly and significantly impacted by visual deficits and I do not feel that these scores reflect his true abilities. Visual memory also represented a relative weakness, largely due to visual deficits impairing his ability to learn novel information efficiently and later recognize this information accurately. Broadly speaking, performances were appropriate relative to age-matched peers across attention/concentration, verbally mediated executive functioning, receptive and expressive language, and verbal learning and memory. Mr. Marotte denied difficulties completing instrumental activities of daily living (ADLs) independently. He has not been driving at the recommendation of his medical team. Given evidence for cognitive dysfunction described above, he meets criteria for a Mild Neurocognitive Disorder ("mild cognitive impairment") at the present time.  The extent and overall severity of visuospatial/perceptual abilities across the testing warrant additional attention. There was a clear and very significant impact of his left-sided hemianopsia across cognitive testing. When stimuli were placed in the left side of his visual field, Mr. Bielec was commonly observed feeling around with his  hands in an attempt to locate said objects (e.g., Clinical cytogeneticist). Visual scanning was notably slowed (TMT A + B) due to him being unable to locate numbers and letters on the left side of the page. During a recognition trial of a visual memory task (NAB Shape Learning), all provided answers came from objects on the right side of the page in front of him, suggesting complete neglect of items on the left side of the page available for consideration. Across a simple dot counting task, he was noted to not recognize dots on the left side of the page and could be found searching in inappropriate locations for additional dots to count near the very edge of the right side of the page. All of these observations suggest the presence of a prominent left-sided field cut consistent with the results of his past neuro-ophthalmological evaluation. Additionally, severe impairments were exhibited across visuoconstructional tasks (Figure Copy) to the extent that his drawn copy of a complex figure was wholly unrecognizable, suggestive of prominent perceptual impairment. All of these findings, both visuospatial/perceptual impairment and the presence of his left-sided field cut (i.e., hemianopsia), align perfectly with expectations surrounding his previously experienced right PCA stroke location.   Recommendations: Given the obvious nature of ongoing left-sided visual neglect and severe visuospatial impairment, I agree with other members of Mr. Rayos's medical team and strongly advise that he fully abstain from all driving pursuits. He may wish to complete a formalized driving evaluation; however, his neuro-ophthalmologist has already documented that his vision does not meet [facility] standards for driving. If he remains interested, he could reach out to the following agencies: [employer] in [place]: [phone] [facility]: [phone] [facility]: [phone] [facility]: [phone] or [phone]  Should there be progression of current deficits over time, Mr. Hochberg is unlikely to regain any independent living skills lost. Therefore, it is recommended that he  remain as involved as possible in all aspects of household chores, finances, and medication management, with supervision to ensure adequate performance. He will likely benefit from the establishment and maintenance of a routine in order to maximize his functional abilities over time.  Mr. Dilmore is encouraged to attend to lifestyle factors for brain health (e.g., regular physical exercise, good nutrition habits and consideration of the MIND-DASH diet, regular participation in cognitively-stimulating activities, and general stress management techniques), which are likely to have benefits for both emotional adjustment and cognition. Optimal control of vascular risk factors (including safe cardiovascular exercise and adherence to dietary recommendations) is encouraged. Continued participation in activities which provide mental stimulation and social interaction is also recommended.   Memory can be improved using internal strategies such as rehearsal, repetition, chunking, mnemonics, association, and imagery. External strategies such as written notes in a consistently used memory journal, visual and nonverbal auditory cues such as a calendar on the refrigerator or appointments with alarm, such as on a cell phone, can also help maximize recall.    To address problems with processing speed, he may wish to consider:   -Ensuring that he is alerted when essential material or instructions are being presented   -Adjusting the speed at which new information is presented   -Allowing for more time in comprehending, processing, and responding in conversation   -Repeating and paraphrasing instructions or conversations aloud  To address problems with fluctuating attention and/or executive dysfunction, he may wish to  consider:   -Avoiding external distractions when needing to concentrate   -Limiting exposure to fast paced environments with multiple sensory demands   -Writing down complicated information and using checklists   -Attempting and completing one task at a time (i.e., no multi-tasking)   -Verbalizing aloud each step of a task to maintain focus   -Taking frequent breaks during the completion of steps/tasks to avoid fatigue   -Reducing the amount of information considered at one time   -Scheduling more difficult activities for a time of day where he is usually most alert  Review of Records:   Mr. Warfel was seen in the ED and ultimately admitted on 09/05/2022. Briefly, Mr. Lantis described initial symptoms on March 21 after he sustained a mechanical fall and hit his left shoulder and rib cage. He had no fractures or dislocation. Since that time, he experienced episodes of clamminess, unsteady gait, and noted three subsequent falls while at home. He and his wife noted some memory issues after his initial event while also describing concerns surrounding worsening short-term memory and diminished vision. He denied any numbness, weakness of his limbs, choking or cough, or swallowing problems. He went to see his PCP who ordered an outpatient MRI which was positive for an acute right PCA stroke. His PCP requested that he come to the ED right away. His family also reported poorly controlled blood pressure and glucose at home recently. In the ED, an acute right PCA stroke was confirmed with symptoms of ataxia and left visual neglect. A head CTA suggested the complete occlusion of the right PCA and focal atherosclerotic disease in the right greater than left MCA, severe stenosis of the M2 branches, occlusion of the small right MCA branch, anterior temporal lobe stenosis and left M2, and occluded or severely stenosed A1. He was stabilized and discharged home on 09/07/2022.   He was seen by [facility] Marlowe Kays, PA-C) on 09/17/2022 for outpatient follow-up of his recent stroke. At that time, he denied cognitive concerns.  He did acknowledge some trouble recalling names and details of recent conversations but attributed this to age and hearing loss. He had stopped driving at the advice of his medical doctors due to his stroke and a depth perception concerns. No other functional concerns were noted. Performance on a brief cognitive screening instrument (MOCA) was 20/30. Ultimately, Mr. Sekel was referred for a comprehensive neuropsychological evaluation to characterize his cognitive abilities and to assist with diagnostic clarity and treatment planning.   He was evaluated by the [facility] and [facility] on 12/09/2022. Results of that evaluation suggested "patient has a left homonymous hemianopsia secondary to stroke that occurred May 2024, and unfortunately Mr. Needham does NOT meet the [place] DMV vision requirements for driving, therefore he should NOT drive indefinitely, patient and patient's wife expressed understanding."  Neuroimaging: Brain MRI on 09/05/2022 revealed an acute infarct within the right PCA distribution (right occipital cortex also involving the white matter adjacent to the right temporal horn and atrium and at the thalamocapsular junction). It also suggested asymmetric cerebral atrophy favoring the anterior right temporal lobe, as well as a more chronic and smaller infarct involving the right corona radiata.   Past Medical History:  Diagnosis Date   Actinic keratosis 10/16/2013   Bilateral carotid artery stenosis 09/23/2021   Less than 50% bilaterally 2023   Combined form of senile cataract of both eyes 05/02/2016   Diabetic retinopathy 03/21/2005   Dupuytren contracture 03/03/2019   Essential hypertension 11/20/1999   Last Assessment & Plan: Change to 5mg  ramipril and see if the lightheaded troubles get better.  He'll update me as  needed.   [name]    Generalized anxiety disorder 10/01/2022   GERD (gastroesophageal reflux disease) 01/1989   Hearing loss 10/20/2014   Helicobacter pylori gastritis 06/11/2011   On EGD 05/2011    Hemorrhagic disorder due to circulating anticoagulants 09/27/2022   History of colonic polyps 06/05/2012   06/05/2011 - 12 polyps max 7 mm, at least 9 adenomas  04/07/2013 - no polyps - repeat colonoscopy 2019 Iva Boop, MD, [facility]        IMO SNOMED Dx Update Oct 2024     Hyperlipidemia    Internal hemorrhoids    Iron deficiency anemia, unspecified 03/04/2011   Ischemic right PCA stroke 09/05/2022   Left homonymous hemianopsia 08/2022   Caused by right PCA stroke   MGUS (monoclonal gammopathy of unknown significance)    possible dx initially, had negative f/u   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  He agrees.   Shoulder pain 07/02/2022   Syncope 11/22/2021   Thrombocytopenia 05/03/2021   Tibial fracture 10/24/2010   L tibia.  Repair 2012.    L tibia.  Repair 2012.  Last Assessment & Plan:  Per ortho.     Type II diabetes mellitus 04/21/1988   With h/o dec in sensation on feet     Unstable ankle 09/12/2020    Past Surgical History:  Procedure Laterality Date   CATARACT EXTRACTION Bilateral    COLONOSCOPY     ESOPHAGOGASTRODUODENOSCOPY  2012 H. pylori gastritis   FLEXIBLE SIGMOIDOSCOPY  05/1988   internal hemorrhoids   KNEE SURGERY  12/2002   lt knee fx repair ORIF   TIBIA FRACTURE SURGERY     left 2012    Current Outpatient Medications:    acetaminophen (TYLENOL) 325 MG  tablet, Take 2 tablets (650 mg total) by mouth every 4 (four) hours as needed for mild pain (or temp > 37.5 C (99.5 F))., Disp: 20 tablet, Rfl: 0   albuterol (VENTOLIN HFA) 108 (90 Base) MCG/ACT inhaler, Inhale 2 puffs into the lungs every 6 (six) hours as needed for wheezing or shortness of breath., Disp: 8 g, Rfl: 0   amoxicillin-clavulanate (AUGMENTIN) 875-125 MG tablet, Take 1 tablet by mouth 2 (two) times daily., Disp: 20 tablet, Rfl: 0   atorvastatin (LIPITOR) 10 MG tablet, Take 1 tablet (10 mg total) by mouth at bedtime., Disp: 90 tablet, Rfl: 10   clopidogrel (PLAVIX) 75 MG tablet, Take 1 tablet (75 mg total) by mouth daily., Disp: 90 tablet, Rfl: 3   glimepiride (AMARYL) 2 MG tablet, Take 1 tablet (2 mg total) by mouth daily., Disp: 90 tablet, Rfl: 10   IRON-VITAMIN C PO, Take by mouth daily. 60 mg tablet, Disp: , Rfl:    losartan (COZAAR) 100 MG tablet, Take 1 tablet (100 mg total) by mouth daily., Disp: 90 tablet, Rfl: 3   memantine (NAMENDA) 5 MG tablet, Take 1 tablet (5 mg total) by mouth 2 (two) times daily., Disp: 60 tablet, Rfl: 11   metFORMIN (GLUCOPHAGE) 850 MG tablet, Take 1 tablet (850 mg total) by mouth 2 (two) times daily., Disp: 180 tablet, Rfl: 10   Multiple Vitamin (MULTIVITAMIN) tablet, Take 1 tablet by mouth daily. Centrum Silver, Disp: , Rfl:    niacin (NIASPAN) 500 MG CR tablet, Take 3 tablets (1,500 mg total) by mouth daily., Disp: , Rfl:    pioglitazone (ACTOS) 45 MG tablet, Take 1 tablet (45 mg total) by mouth daily., Disp: 90 tablet, Rfl: 10  Clinical Interview:   The following information was obtained during a clinical interview with Mr. Mitrani and his wife prior to cognitive testing.  Cognitive Symptoms: Decreased short-term memory: Endorsed. However, he largely diminished concerns, commonly attributing them to age and hearing loss. Examples included trouble recalling names (which was said to be somewhat longstanding in nature), as well as misplacing things. His  wife noted memory concerns which pre-dated his May 2024 stroke by a mild extent, largely focusing on rapid forgetting concerns and frequent repetition in day-to-day conversation. She noted perhaps a slight worsening following his May stroke experience.  Decreased long-term memory: Denied. Decreased attention/concentration: Denied. His wife noted that occasional attentional difficulties may be interest dependant.  Reduced processing speed: Denied. Difficulties with executive functions: Denied. His wife noted that organization likely represented a longstanding weakness but did not report her perception of significant decline after his recent stroke. They denied trouble with impulsivity or any significant personality changes.  Difficulties with emotion regulation: Denied. Difficulties with receptive language: Denied. Difficulties with word finding: Denied. Decreased visuoperceptual ability: Endorsed. He reported the presence of a left homonymous hemianopsia stemming from his stroke. However, he strongly denied this creating any functional difficulties in his day-to-day life and expressed his intention to return to driving in the future.  Difficulties completing ADLs: His wife manages medications, finances, and bill paying responsibilities. However, this is longstanding in nature and not due to worsening cognitive impairment. He has not driven at the request of  his medical team due to his left-sided hemianopsia and ongoing visual deficits.   Additional Medical History: History of traumatic brain injury/concussion: Denied. History of stroke: Endorsed (see above). History of seizure activity: Denied. History of known exposure to toxins: Denied. Symptoms of chronic pain: Endorsed. He reported experiencing a torn rotator cuff during his fall this past March which causes ongoing discomfort. He reported being scheduled for a corrective surgical procedure later in the month.  Experience of frequent  headaches/migraines: Denied. Frequent instances of dizziness/vertigo: Denied.  Sensory changes: He wears glasses with some benefit. Visual deficits from his stroke are described above. He is also hard of hearing. His wife noted that he is essentially deaf in his right ear and experiences further hearing loss on the left. He does not utilize hearing aids. He reported trialing them in the past but was unable to find them effective. Other sensory changes/difficulties were denied.  Balance/coordination difficulties: He acknowledged that he is "not as steady as he used to be." He ambulates with a cane for added stability with benefit. He denied one side of the body exhibiting greater weakness or instability relative to the other. He has experienced a few falls in the past year as described above. Only his initial fall in March resulted in physical injury.  Other motor difficulties: Denied.  Sleep History: Estimated hours obtained each night: 5-6 hours.  Difficulties falling asleep: Endorsed. He described having an overactive mind which can make it difficult to fall asleep at times. Difficulties staying asleep: Endorsed. He reported waking throughout the night in order to use the restroom.  Feels rested and refreshed upon awakening: Variably so depending on the quantity and quality of sleep obtained the night before.   History of snoring: Denied. History of waking up gasping for air: Denied. Witnessed breath cessation while asleep: Denied.  History of vivid dreaming: Denied. Excessive movement while asleep: Denied. Instances of acting out his dreams: Denied.  Psychiatric/Behavioral Health History: Depression: He described his current mood as "pretty good" and denied to his knowledge any prior mental health concerns or formal diagnoses. He did acknowledge some ongoing frustration surrounding driving limitations as he feels these are unfounded and previous eye exams were conducted incorrectly. Current  or remote suicidal ideation, intent, or plan was denied.  Anxiety: Denied. Mania: Denied. Trauma History: Denied. Visual/auditory hallucinations: Denied. Delusional thoughts: Denied.  Tobacco: Denied. Alcohol: He denied current alcohol consumption as well as a history of problematic alcohol abuse or dependence.  Recreational drugs: Denied.  Family History: Problem Relation Age of Onset   Cancer Mother        ?   Diabetes Mother    Heart failure Mother    Emphysema Father    Cancer Father        ?   Alzheimer's disease Father    Dementia Father    Skin cancer Brother    Diabetes Brother    Colon cancer Neg Hx    Prostate cancer Neg Hx    Esophageal cancer Neg Hx    Rectal cancer Neg Hx    Stomach cancer Neg Hx    This information was confirmed by Mr. Javier.  Academic/Vocational History: Highest level of educational attainment: 12 years. He graduated from high school and described himself as a good (A/B) student in academic settings. No relative weaknesses were identified.  History of developmental delay: Denied. History of grade repetition: Denied. Enrollment in special education courses: Denied. History of LD/ADHD: Denied.  Employment: Prior to his  stroke, he was working as an Metallurgist and denied any cognitive concerns or difficulties. He has yet to return to work since his stroke occurred but did express a desire to do so following his upcoming shoulder surgery.   Evaluation Results:   Behavioral Observations: Mr. Bolon was accompanied by his wife, arrived to his appointment on time, and was appropriately dressed and groomed. He appeared alert. He ambulated slowly with an observation of a somewhat shuffling gait. He utilized a cane for added stability. Gross motor functioning appeared intact upon informal observation and no abnormal movements (e.g., tremors) were noted. His affect was generally relaxed and positive. Hearing loss was readily apparent throughout the  interview. Spontaneous speech was fluent and word finding difficulties were not observed during the clinical interview. Thought processes were coherent, organized, and normal in content. Insight into his cognitive difficulties appeared limited and I fear that he does not have any degree of insight regarding ongoing visual impairments.   Given hearing concerns, an amplification device (i.e., Pocket Talker) was utilized during testing procedures to limit hearing loss as a testing confound. During testing, sustained attention was appropriate. Task engagement was adequate and he persisted when challenged. Observations surrounding advanced visuospatial impairment have been described in an earlier portion of this report and will not be re-hashed here. They were very significant and impacted visuospatial and non-visuospatial performances. Overall, Mr. Harvin was cooperative with the clinical interview and subsequent testing procedures.   Adequacy of Effort: The validity of neuropsychological testing is limited by the extent to which the individual being tested may be assumed to have exerted adequate effort during testing. Mr. Aurich expressed his intention to perform to the best of his abilities and exhibited adequate task engagement and persistence. Scores across stand-alone and embedded performance validity measures were variable. However, his sole below expectation performance is believed to be due to true and very severe visuospatial impairment rather than poor engagement or attempts to perform poorly. As such, the results of the current evaluation are believed to be a valid representation of Mr. Mccree's current cognitive functioning.  Test Results: Mr. Bundren was oriented at the time of the current evaluation.  Intellectual abilities based upon educational and vocational attainment were estimated to be in the average range. Premorbid abilities were estimated to be within the above average range based upon a  single-word reading test.   Processing speed was exceptionally low to well below average; however, these performances were directly impacted by ongoing visual impairment and likely due not reflect true abilities. Basic attention was below average. More complex attention (e.g., working memory) was average. Executive functioning was average to above average across verbal tasks. His sole well below average performance was directly impacted by ongoing visual impairment.  Assessed receptive language abilities were average. Likewise, Mr. Catton did not exhibit any difficulties comprehending task instructions and answered all questions asked of him appropriately. Assessed expressive language (e.g., verbal fluency and confrontation naming) was mildly variable but overall appropriate, ranging from the below average to above average normative ranges.     Assessed visuospatial/visuoconstructional abilities were exceptionally low to below average outside his drawing of a clock (which was appropriate). His copy of a complex figure exhibited severe visual distortions to the extent that the figure was largely unrecognizable.    Learning (i.e., encoding) of novel information was well below average across a visual memory task but average across all verbal tasks. Spontaneous delayed recall (i.e., retrieval) of previously learned information was commensurate with performances  across learning trials. Retention rates were 150% across a list learning task, 100% across a story learning task, and 100% across a shape learning task. Performance across recognition tasks was above average across a daily living task but exceptionally low to well below average across shape and list-based tasks, suggesting limited evidence for information consolidation.   Results of emotional screening instruments suggested that recent symptoms of generalized anxiety were in the moderate range, while symptoms of depression were within the mild range. A  screening instrument assessing recent sleep quality suggested the presence of minimal sleep dysfunction.  Tables of Scores:   Note: This summary of test scores accompanies the interpretive report and should not be considered in isolation without reference to the appropriate sections in the text. Descriptors are based on appropriate normative data and may be adjusted based on clinical judgment. Terms such as "Within Normal Limits" and "Outside Normal Limits" are used when a more specific description of the test score cannot be determined.       Percentile - Normative Descriptor > 98 - Exceptionally High 91-97 - Well Above Average 75-90 - Above Average 25-74 - Average 9-24 - Below Average 2-8 - Well Below Average < 2 - Exceptionally Low       Validity:   DESCRIPTOR       DCT: --- --- Outside Normal Limits  NAB EVI: --- --- Within Normal Limits       Orientation:      Raw Score Percentile   NAB Orientation, Form 1 28/29 --- ---       Cognitive Screening:      Raw Score Percentile   SLUMS: 25/30 --- ---       Intellectual Functioning:      Standard Score Percentile   Test of Premorbid Functioning: 113 81 Above Average       Memory:     NAB Memory Module, Form 1: Standard Score/ T Score Percentile   Total Memory Index 100 50 Average  List Learning       Total Trials 1-3 20/36 (53) 62 Average    List B 9/12 (78) >99 Exceptionally High    Short Delay Free Recall 6/12 (51) 54 Average    Long Delay Free Recall 9/12 (65) 93 Well Above Average    Retention Percentage 150 (67) 96 Well Above Average    Recognition Discriminability 1 (33) 5 Well Below Average  Shape Learning       Total Trials 1-3 8/27 (35) 7 Well Below Average    Delayed Recall 3/9 (40) 16 Below Average    Retention Percentage 100 (50) 50 Average    Recognition Discriminability 0 (16) <1 Exceptionally Low  Story Learning       Immediate Recall 52/80 (47) 38 Average    Delayed Recall 32/40 (56) 73 Average     Retention Percentage 100 (53) 62 Average  Daily Living Memory       Immediate Recall 40/51 (52) 58 Average    Delayed Recall 15/17 (58) 79 Above Average    Retention Percentage 94 (56) 73 Average    Recognition Hits 10/10 (61) 86 Above Average       Attention/Executive Function:     Trail Making Test (TMT): Raw Score (T Score) Percentile     Part A 160 secs.,  0 errors (19) <1 Exceptionally Low    Part B 216 secs.,  2 errors (33) 5 Well Below Average         Scaled Score  Percentile   WAIS-IV Coding: 4 2 Well Below Average       NAB Attention Module, Form 1: T Score Percentile     Digits Forward 42 21 Below Average    Digits Backwards 51 54 Average        Scaled Score Percentile   WAIS-IV Similarities: 12 75 Above Average       D-KEFS Verbal Fluency Test: Raw Score (Scaled Score) Percentile     Letter Total Correct 27 (8) 25 Average    Category Total Correct 33 (10) 50 Average    Category Switching Total Correct 13 (11) 63 Average    Category Switching Accuracy 11 (10) 50 Average      Total Set Loss Errors 2 (10) 50 Average      Total Repetition Errors 6 (7) 16 Below Average       Language:     Verbal Fluency Test: Raw Score (T Score) Percentile     Phonemic Fluency (FAS) 27 (41) 18 Below Average    Animal Fluency 18 (51) 54 Average        NAB Language Module, Form 1: T Score Percentile     Auditory Comprehension 56 73 Average    Naming 31/31 (58) 79 Above Average       Visuospatial/Visuoconstruction:      Raw Score Percentile   Clock Drawing: 10/10 --- Within Normal Limits       NAB Spatial Module, Form 1: T Score Percentile     Visual Discrimination 24 <1 Exceptionally Low    Figure Drawing Copy 20 <1 Exceptionally Low        Standard Score/ Scaled Score Percentile   WAIS-IV Perceptual Reasoning Index: 67 1 Exceptionally Low    Block Design: 2 <1 Exceptionally Low    Matrix Reasoning: 5 5 Well Below Average    Visual Puzzles: 6 9 Below Average       Mood  and Personality:      Raw Score Percentile   PROMIS Depression Questionnaire: 21 --- Mild  PROMIS Anxiety Questionnaire: 21 --- Moderate       Additional Questionnaires:      Raw Score Percentile   PROMIS Sleep Disturbance Questionnaire: 24 --- None to Slight   Informed Consent and Coding/Compliance:   The current evaluation represents a clinical evaluation for the purposes previously outlined by the referral source and is in no way reflective of a forensic evaluation.   Mr. Bellmore was provided with a verbal description of the nature and purpose of the present neuropsychological evaluation. Also reviewed were the foreseeable risks and/or discomforts and benefits of the procedure, limits of confidentiality, and mandatory reporting requirements of this provider. The patient was given the opportunity to ask questions and receive answers about the evaluation. Oral consent to participate was provided by the patient.   This evaluation was conducted by Newman Nickels, Ph.D., ABPP-CN, board certified clinical neuropsychologist. Mr. Torsiello completed a clinical interview with Dr. Milbert Coulter, billed as one unit [phone], and 145 minutes of cognitive testing and scoring, billed as one unit [phone] and four additional units [id number]. Psychometrist Wallace Keller, B.S. assisted Dr. Milbert Coulter with test administration and scoring procedures. As a separate and discrete service, one unit [id number] and two units [phone] were billed for Dr. Tammy Sours time spent in interpretation and report writing.

## 2023-05-07 NOTE — Progress Notes (Signed)
   Psychometrician Note   Cognitive testing was administered to Dennis Wise by Wallace Keller, B.S. (psychometrist) under the supervision of Dr. Newman Nickels, Ph.D., licensed psychologist on 05/07/2023. Mr. Ellers did not appear overtly distressed by the testing session per behavioral observation or responses across self-report questionnaires. Rest breaks were offered.    The battery of tests administered was selected by Dr. Newman Nickels, Ph.D. with consideration to Mr. Maclaughlin's current level of functioning, the nature of his symptoms, emotional and behavioral responses during interview, level of literacy, observed level of motivation/effort, and the nature of the referral question. This battery was communicated to the psychometrist. Communication between Dr. Newman Nickels, Ph.D. and the psychometrist was ongoing throughout the evaluation and Dr. Newman Nickels, Ph.D. was immediately accessible at all times. Dr. Newman Nickels, Ph.D. provided supervision to the psychometrist on the date of this service to the extent necessary to assure the quality of all services provided.    Dennis Wise will return within approximately 1-2 weeks for an interactive feedback session with Dr. Milbert Coulter at which time his test performances, clinical impressions, and treatment recommendations will be reviewed in detail. Mr. Janssen understands he can contact our office should he require our assistance before this time.  A total of 145 minutes of billable time were spent face-to-face with Mr. Ceresa by the psychometrist. This includes both test administration and scoring time. Billing for these services is reflected in the clinical report generated by Dr. Newman Nickels, Ph.D.  This note reflects time spent with the psychometrician and does not include test scores or any clinical interpretations made by Dr. Milbert Coulter. The full report will follow in a separate note.

## 2023-05-14 ENCOUNTER — Ambulatory Visit: Payer: Medicare HMO | Admitting: Psychology

## 2023-05-14 DIAGNOSIS — I999 Unspecified disorder of circulatory system: Secondary | ICD-10-CM

## 2023-05-14 DIAGNOSIS — H53462 Homonymous bilateral field defects, left side: Secondary | ICD-10-CM

## 2023-05-14 DIAGNOSIS — I63531 Cerebral infarction due to unspecified occlusion or stenosis of right posterior cerebral artery: Secondary | ICD-10-CM | POA: Diagnosis not present

## 2023-05-14 DIAGNOSIS — F0670 Mild neurocognitive disorder due to known physiological condition without behavioral disturbance: Secondary | ICD-10-CM

## 2023-05-14 NOTE — Progress Notes (Signed)
   Neuropsychology Feedback Session [name]. [facility] [place] Department of Neurology  Reason for Referral:   Dennis Wise is a 73 y.o. right-handed Caucasian male referred by Marlowe Kays, PA-C, to characterize his current cognitive functioning and assist with diagnostic clarity and treatment planning in the context of a right PCA infarct this past May and concern surrounding residual cognitive dysfunction.   Feedback:   [name] completed a comprehensive neuropsychological evaluation on 05/07/2023. Please refer to that encounter for the full report and recommendations. Briefly, results suggested severe impairment surrounding visuospatial/visuoconstructional abilities. Impairment was also exhibited across processing speed; however, impaired performances were directly and significantly impacted by visual deficits (i.e., slowed visual scanning) and these scores may not reflect his true abilities. Visual memory also represented a relative weakness, largely due to visual deficits impairing his ability to learn novel information efficiently and later recognize this information accurately. There was a clear and very significant impact of his left-sided hemianopsia across cognitive testing. All findings, both visuospatial/perceptual impairment and the presence of his left-sided field cut (i.e., hemianopsia), align perfectly with expectations surrounding his previously experienced right PCA stroke location.   [name] was accompanied by his wife during the current feedback session. Content of the current session focused on the results of his neuropsychological evaluation. [name] was given the opportunity to ask questions and his questions were answered. He was encouraged to reach out should additional questions arise. A copy of his report was provided at the conclusion of the visit.      One unit [phone] was billed for Dr. Tammy Sours time spent preparing for, conducting, and documenting the  current feedback session with [name].

## 2023-05-15 DIAGNOSIS — R001 Bradycardia, unspecified: Secondary | ICD-10-CM | POA: Diagnosis not present

## 2023-05-18 DIAGNOSIS — H02834 Dermatochalasis of left upper eyelid: Secondary | ICD-10-CM | POA: Diagnosis not present

## 2023-05-18 DIAGNOSIS — H4311 Vitreous hemorrhage, right eye: Secondary | ICD-10-CM | POA: Diagnosis not present

## 2023-05-18 DIAGNOSIS — H40003 Preglaucoma, unspecified, bilateral: Secondary | ICD-10-CM | POA: Diagnosis not present

## 2023-05-18 DIAGNOSIS — H53462 Homonymous bilateral field defects, left side: Secondary | ICD-10-CM | POA: Diagnosis not present

## 2023-05-18 DIAGNOSIS — E113513 Type 2 diabetes mellitus with proliferative diabetic retinopathy with macular edema, bilateral: Secondary | ICD-10-CM | POA: Diagnosis not present

## 2023-05-18 DIAGNOSIS — H02831 Dermatochalasis of right upper eyelid: Secondary | ICD-10-CM | POA: Diagnosis not present

## 2023-05-18 DIAGNOSIS — Z961 Presence of intraocular lens: Secondary | ICD-10-CM | POA: Diagnosis not present

## 2023-05-26 DIAGNOSIS — I6523 Occlusion and stenosis of bilateral carotid arteries: Secondary | ICD-10-CM | POA: Diagnosis not present

## 2023-05-26 DIAGNOSIS — I10 Essential (primary) hypertension: Secondary | ICD-10-CM | POA: Diagnosis not present

## 2023-05-26 DIAGNOSIS — R001 Bradycardia, unspecified: Secondary | ICD-10-CM | POA: Diagnosis not present

## 2023-05-26 DIAGNOSIS — I071 Rheumatic tricuspid insufficiency: Secondary | ICD-10-CM | POA: Diagnosis not present

## 2023-05-26 DIAGNOSIS — E782 Mixed hyperlipidemia: Secondary | ICD-10-CM | POA: Diagnosis not present

## 2023-05-29 ENCOUNTER — Ambulatory Visit (INDEPENDENT_AMBULATORY_CARE_PROVIDER_SITE_OTHER): Payer: Medicare HMO | Admitting: Family Medicine

## 2023-05-29 ENCOUNTER — Encounter: Payer: Self-pay | Admitting: Family Medicine

## 2023-05-29 VITALS — BP 140/80 | HR 57 | Temp 98.4°F | Ht 73.0 in | Wt 259.2 lb

## 2023-05-29 DIAGNOSIS — R351 Nocturia: Secondary | ICD-10-CM | POA: Diagnosis not present

## 2023-05-29 DIAGNOSIS — E113299 Type 2 diabetes mellitus with mild nonproliferative diabetic retinopathy without macular edema, unspecified eye: Secondary | ICD-10-CM

## 2023-05-29 DIAGNOSIS — M25519 Pain in unspecified shoulder: Secondary | ICD-10-CM

## 2023-05-29 DIAGNOSIS — Z659 Problem related to unspecified psychosocial circumstances: Secondary | ICD-10-CM | POA: Diagnosis not present

## 2023-05-29 LAB — POCT GLYCOSYLATED HEMOGLOBIN (HGB A1C): Hemoglobin A1C: 7.4 % — AB (ref 4.0–5.6)

## 2023-05-29 MED ORDER — TAMSULOSIN HCL 0.4 MG PO CAPS: 0.4000 mg | ORAL_CAPSULE | Freq: Every day | ORAL | 3 refills | Status: DC

## 2023-05-29 NOTE — Patient Instructions (Addendum)
 Let me know if you don't get a call about counseling.   Start flomax  and let me know if that isn't helping.  Take care.  Glad to see you. Recheck at a yearly visit this summer, labs ahead of time if possible but also okay to do at the visit.

## 2023-05-29 NOTE — Progress Notes (Signed)
 Diabetes:  Using medications without difficulties: yes Hypoglycemic episodes:no Hyperglycemic episodes:no Feet problems: no Blood Sugars averaging: 100-150 eye exam within last year: yes  L shoulder in sling.  Still with 5/10 pain.  Pain med used prior to OV.  Sleeping in recliner in the meantime.    Recheck BP done at OV.    Sleep disrupted by nocturia.  Discussed.    D/w pt about counseling and grief, social stressor with not working and not driving.    Meds, vitals, and allergies reviewed.  ROS: Per HPI unless specifically indicated in ROS section   GEN: nad, alert and oriented HEENT: ncat NECK: supple w/o LA CV: rrr. PULM: ctab, no inc wob ABD: soft, +bs EXT: no edema SKIN: well perfused.  L arm in sling.

## 2023-05-31 DIAGNOSIS — Z659 Problem related to unspecified psychosocial circumstances: Secondary | ICD-10-CM | POA: Insufficient documentation

## 2023-05-31 DIAGNOSIS — E113299 Type 2 diabetes mellitus with mild nonproliferative diabetic retinopathy without macular edema, unspecified eye: Secondary | ICD-10-CM | POA: Insufficient documentation

## 2023-05-31 NOTE — Assessment & Plan Note (Signed)
 Recheck at a yearly visit this summer, labs ahead of time if possible but also okay to do at the visit.  A1c d/w pt at OV. Continue glimepiride , metformin  actos .

## 2023-05-31 NOTE — Assessment & Plan Note (Signed)
 D/w pt about counseling and grief, social stressor with not working and not driving.  Refer for counseling.

## 2023-05-31 NOTE — Assessment & Plan Note (Signed)
 Per ortho, sling adjusted with relief.

## 2023-05-31 NOTE — Assessment & Plan Note (Signed)
 Restart flomax  with routine cautions.

## 2023-07-08 ENCOUNTER — Ambulatory Visit: Payer: Medicare HMO | Admitting: Clinical

## 2023-07-08 DIAGNOSIS — F411 Generalized anxiety disorder: Secondary | ICD-10-CM

## 2023-07-08 NOTE — Progress Notes (Signed)
   Doree Barthel, LCSW

## 2023-07-08 NOTE — Progress Notes (Signed)
 Dennis Wise Behavioral Health Counselor Initial Adult Exam  Name: Dennis Wise Date: 07/08/2023 MRN: [id number] DOB: [birth year] PCP: Dennis Nam, MD  Time spent: 9:35am - 10:30am   Guardian/Payee:  NA    Paperwork requested:  NA  Reason for Visit /Presenting Problem: Patient stated, "in march of last year I had a fall at work and tore up my shoulder", "I had surgery in January and healing from that", "in May I had a stroke". Patient stated, "the biggest issue I'm having right now, they told me I can't drive" and stated, "I don't have good peripheral vision". Patient stated, "the loss of my driving privileges and independence" and reported loss of ability to work/income are current concerns.  Patient reported "its just frustrating dealing with where I am in life". Patient stated, "I'm a big worrier".   Mental Status Exam: Appearance:   Neat and Well Groomed     Behavior:  Appropriate  Motor:  Normal  Speech/Language:   Clear and Coherent and Normal Rate  Affect:  Appropriate  Mood:  Patient stated, "pretty good actually" in response to current mood  Thought process:  tangential  Thought content:    Tangential  Sensory/Perceptual disturbances:    WNL  Orientation:  oriented to person, place, and situation  Attention:  Good  Concentration:  Good  Memory:  WNL  Fund of knowledge:   Good  Insight:    Good  Judgment:   Good  Impulse Control:  Good   Reported Symptoms:  Patient reported worrying daily, difficulty falling asleep, stated  "its frustrating to lay back there and think about all the things", ruminating thoughts, decreased motivation, stated "frustration towards not being about to resolve the problem", stated "I'm always irritable", restlessness, frustration. Patient reported he has experienced worry for "a few years now". Patient stated, "I get frustrated because I'm having to depend on other people for things I use to do". Patient reported "bad thoughts" at times and reported  a history of suicidal ideation. Patient reported a history of thoughts, "If I was dead she (wife) wouldn't have all the problems". Patient denied history of plan or intent. Patient stated, "I have thought about suicide" and stated, "its a thought only".   Risk Assessment: Danger to Self:  No Patient denied current suicidal ideation. Patient reported a history of suicidal ideation and denied history of plan or intent.  Self-injurious Behavior: No Danger to Others: No Patient denied current and past homicidal ideation Duty to Warn:no Physical Aggression / Violence:No  Access to Firearms a concern: No current concern but reported patient has access Gang Involvement:No  Patient / guardian was educated about steps to take if suicide or homicide risk level increases between visits: yes While future psychiatric events cannot be accurately predicted, the patient does not currently require acute inpatient psychiatric care and does not currently meet [facility] involuntary commitment criteria.  Substance Abuse History: Current substance abuse: No  Patient reported no current or past tobacco use. Patient stated, "I use to like a drink (alcohol) every once in a while". Patient reported no current or past drug use  Past Psychiatric History:   No previous psychological problems have been observed Outpatient Providers: none History of Psych Hospitalization: No  Psychological Testing:  none    Abuse History:  Victim of: No.,  none    Report needed: No. Victim of Neglect:No. Perpetrator of  none   Witness / Exposure to Domestic Violence: No   Protective Services Involvement: No  Witness to Community Violence:  No   Family History:  Family History  Problem Relation Age of Onset   Cancer Mother        ?   Diabetes Mother    Heart failure Mother    Emphysema Father    Cancer Father        ?   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 History of legal issue / charges:  none  Medical History/Surgical History: reviewed Past Medical History:  Diagnosis Date   Actinic keratosis 10/16/2013   Bilateral carotid artery stenosis 09/23/2021   Less than 50% bilaterally 2023   Combined form of senile cataract of both eyes 05/02/2016   Diabetic retinopathy 03/21/2005   Dupuytren contracture 03/03/2019   Essential hypertension 11/20/1999   Last Assessment & Plan: Change to 5mg  ramipril and see if the lightheaded troubles get better.  He'll update me as needed.   [name]    Generalized anxiety disorder 10/01/2022   GERD (gastroesophageal reflux disease) 01/1989   Hearing loss 10/20/2014   Helicobacter pylori gastritis 06/11/2011   On EGD  05/2011    Hemorrhagic disorder due to circulating anticoagulants 09/27/2022   History of colonic polyps 06/05/2012   06/05/2011 - 12 polyps max 7 mm, at least 9 adenomas  04/07/2013 - no polyps - repeat colonoscopy 2019 Dennis Boop, MD, [facility]        IMO SNOMED Dx Update Oct 2024     Hyperlipidemia    Internal hemorrhoids    Iron deficiency anemia, unspecified 03/04/2011   Ischemic right PCA stroke 09/05/2022   Left homonymous hemianopsia 08/2022   Caused by right PCA stroke   MGUS (monoclonal gammopathy of unknown significance)    possible dx initially, had negative f/u   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  He agrees.   Shoulder pain  07/02/2022   Syncope 11/22/2021   Thrombocytopenia 05/03/2021   Tibial fracture 10/24/2010   L tibia.  Repair 2012.    L tibia.  Repair 2012.  Last Assessment & Plan:  Per ortho.     Type II diabetes mellitus 04/21/1988   With h/o dec in sensation on feet     Unstable ankle 09/12/2020    Past Surgical History:  Procedure Laterality Date   CATARACT EXTRACTION Bilateral    COLONOSCOPY     ESOPHAGOGASTRODUODENOSCOPY     2012 H. pylori gastritis   FLEXIBLE SIGMOIDOSCOPY  05/1988   internal hemorrhoids   KNEE SURGERY  12/2002   lt knee fx repair ORIF   TIBIA FRACTURE SURGERY     left 2012    Medications: Current Outpatient Medications  Medication Sig Dispense Refill   acetaminophen (TYLENOL) 325 MG tablet Take 2 tablets (650 mg total) by mouth every 4 (four) hours as needed for mild pain (or temp > 37.5 C (99.5 F)). 20 tablet 0   albuterol  (VENTOLIN HFA) 108 (90 Base) MCG/ACT inhaler Inhale 2 puffs into the lungs every 6 (six) hours as needed for wheezing or shortness of breath. 8 g 0   atorvastatin (LIPITOR) 10 MG tablet Take 1 tablet (10 mg total) by mouth at bedtime. 90 tablet 10   clopidogrel (PLAVIX) 75 MG tablet Take 1 tablet (75 mg total) by mouth daily. 90 tablet 3   glimepiride (AMARYL) 2 MG tablet Take 1 tablet (2 mg total) by mouth daily. 90 tablet 10   IRON-VITAMIN C PO Take by mouth daily. 60 mg tablet     losartan (COZAAR) 100 MG tablet Take 1 tablet (100 mg total) by mouth daily. 90 tablet 3   memantine (NAMENDA) 5 MG tablet Take 1 tablet (5 mg total) by mouth 2 (two) times daily. 60 tablet 11   metFORMIN (GLUCOPHAGE) 850 MG tablet Take 1 tablet (850 mg total) by mouth 2 (two) times daily. 180 tablet 10   Multiple Vitamin (MULTIVITAMIN) tablet Take 1 tablet by mouth daily. Centrum Silver     niacin (NIASPAN) 500 MG CR tablet Take 3 tablets (1,500 mg total) by mouth daily.     oxyCODONE (OXY IR/ROXICODONE) 5 MG immediate release tablet Take by mouth 2 (two) times daily.     pioglitazone (ACTOS) 45 MG tablet Take 1 tablet (45 mg total) by mouth daily. 90 tablet 10   tamsulosin (FLOMAX) 0.4 MG CAPS capsule Take 1 capsule (0.4 mg total) by mouth daily. 90 capsule 3   No current facility-administered medications for this visit.    Allergies  Allergen Reactions   Hydralazine     Short of breath.     Promethazine Hcl     REACTION: AGITIATION    Diagnoses:  Generalized anxiety disorder  R/O Depressive disorder  Plan of Care: Patient is a 74 year old male who presented for an initial assessment. Clinician conducted initial assessment in person from clinician's office at [facility]. Patient reported the following symptoms: worrying daily, difficulty falling asleep, ruminating thoughts, decreased motivation, irritability, restlessness, and frustration. Patient reported he has experienced worry for "a few  years now". Patient denied current suicidal ideation. Patient reported a history of suicidal ideation and patient denied history of plan or intent. Patient denied current and past homicidal ideation. Patient reported no current substance use. Patient reported no history of outpatient or inpatient psychiatric treatment. Patient reported health conditions, conflict with wife at times, no longer being able  to drive, and not being able to work as current stressors. Patient identified patient's spouse, neighbors, patient's son, patient's brother and brother's family as current supports. It is recommended patient be referred to a psychiatrist for a medication management consult and recommended patient participate in individual therapy biweekly. Clinician will review recommendations and treatment plan with patient during follow up appointment. Treatment plan will be developed during follow up appointment.   Collaboration of Care: Primary Care Provider AEB Discussed completion of a consent for patient's PCP, Dr. [name]  Patient/Guardian was advised Release of Information must be obtained prior to any record release in order to collaborate their care with an outside provider.   Doree Barthel, LCSW

## 2023-07-15 ENCOUNTER — Telehealth: Payer: Self-pay

## 2023-07-15 DIAGNOSIS — K760 Fatty (change of) liver, not elsewhere classified: Secondary | ICD-10-CM

## 2023-07-15 NOTE — Telephone Encounter (Signed)
 [name] informed that Dennis Wise's appointment is set up for 08/03/2023 at 9:30AM at the [facility], arrive at 9:15AM and NPO 6 hours.

## 2023-07-15 NOTE — Telephone Encounter (Signed)
-----   Message from [facility] sent at 02/11/2023 10:06 AM EDT ----- Set up U/S liver with elastograpy in April 2025 at [facility]. Call [name] his wife first to check on dates/times. [phone].

## 2023-07-21 ENCOUNTER — Ambulatory Visit: Admitting: Clinical

## 2023-07-21 DIAGNOSIS — F411 Generalized anxiety disorder: Secondary | ICD-10-CM | POA: Diagnosis not present

## 2023-07-21 NOTE — Progress Notes (Signed)
  Behavioral Health Counselor/Therapist Progress Note  Patient ID: Dennis Wise, MRN: [id number]    Date: 07/21/23  Time Spent: 9:35  am - 10:26 am : 51 Minutes  Treatment Type: Individual Therapy.  Reported Symptoms: worry, difficulty falling asleep  Mental Status Exam: Appearance:  Neat and Well Groomed     Behavior: Appropriate  Motor: Normal  Speech/Language:  Clear and Coherent and Normal Rate  Affect: Appropriate  Mood: normal  Thought process: tangential  Thought content:   Tangential  Sensory/Perceptual disturbances:   WNL  Orientation: oriented to person, place, and situation  Attention: Good  Concentration: Good  Memory: WNL  Fund of knowledge:  Good  Insight:   Good  Judgment:  Good  Impulse Control: Good   Risk Assessment: Danger to Self:  No Patient denied current suicidal ideation  Self-injurious Behavior: No Danger to Others: No Patient denied current homicidal ideation Duty to Warn:no Physical Aggression / Violence:No  Access to Firearms a concern: No  Gang Involvement:No   Subjective:  Patient stated, "my physical therapy for my shoulder is going great" in response to events since last session. Patient stated, "I'm frustrated of course because I can't drive and if I can't drive I can't work". Patient reported concern related to finances. Patient reported he continues to experience difficulty falling asleep and stated, "its just a little frustrating". Patient stated, "too much thinking" in reference to difficulty falling asleep. Patient stated, "basically I think I'm pretty good, frustrated with where we are". Patient reported he would like to regain his driving privileges and patient reported he was advised to refrain from driving due to vision changes. Patient stated, "the not working is a frustration for me" and reported he enjoys working. Patient stated, "I'm in a pretty good mood today". Patient reported patient's wife has indicated patient does not  recall information at times. Patient reported he is open to a referral to a psychiatrist and participation in therapy.   Interventions: Motivational Interviewing. Clinician conducted session in person at clinician's office at [facility]. Reviewed events since last session and assessed for changes.  Clinician reviewed diagnosis and treatment recommendations. Provided psycho education related to diagnosis and treatment.   Collaboration of Care: Other Discussed consent required for referral to [facility]  Patient/Guardian was advised Release of Information must be obtained prior to any record release in order to collaborate their care with an outside provider.    Diagnosis:  Generalized anxiety disorder   Plan: Goals to be developed during follow up appointment on 08/12/23.                    Doree Barthel, LCSW

## 2023-07-29 ENCOUNTER — Inpatient Hospital Stay: Payer: Medicare HMO | Attending: Oncology

## 2023-07-29 DIAGNOSIS — Z860101 Personal history of adenomatous and serrated colon polyps: Secondary | ICD-10-CM | POA: Insufficient documentation

## 2023-07-29 DIAGNOSIS — Z808 Family history of malignant neoplasm of other organs or systems: Secondary | ICD-10-CM | POA: Diagnosis not present

## 2023-07-29 DIAGNOSIS — D0462 Carcinoma in situ of skin of left upper limb, including shoulder: Secondary | ICD-10-CM | POA: Diagnosis not present

## 2023-07-29 DIAGNOSIS — Z8673 Personal history of transient ischemic attack (TIA), and cerebral infarction without residual deficits: Secondary | ICD-10-CM | POA: Insufficient documentation

## 2023-07-29 DIAGNOSIS — Z818 Family history of other mental and behavioral disorders: Secondary | ICD-10-CM | POA: Diagnosis not present

## 2023-07-29 DIAGNOSIS — Z833 Family history of diabetes mellitus: Secondary | ICD-10-CM | POA: Insufficient documentation

## 2023-07-29 DIAGNOSIS — D696 Thrombocytopenia, unspecified: Secondary | ICD-10-CM | POA: Insufficient documentation

## 2023-07-29 DIAGNOSIS — D485 Neoplasm of uncertain behavior of skin: Secondary | ICD-10-CM | POA: Diagnosis not present

## 2023-07-29 DIAGNOSIS — Z7902 Long term (current) use of antithrombotics/antiplatelets: Secondary | ICD-10-CM | POA: Insufficient documentation

## 2023-07-29 DIAGNOSIS — D2272 Melanocytic nevi of left lower limb, including hip: Secondary | ICD-10-CM | POA: Diagnosis not present

## 2023-07-29 DIAGNOSIS — K760 Fatty (change of) liver, not elsewhere classified: Secondary | ICD-10-CM | POA: Diagnosis not present

## 2023-07-29 DIAGNOSIS — R161 Splenomegaly, not elsewhere classified: Secondary | ICD-10-CM | POA: Diagnosis not present

## 2023-07-29 DIAGNOSIS — Z85828 Personal history of other malignant neoplasm of skin: Secondary | ICD-10-CM | POA: Diagnosis not present

## 2023-07-29 DIAGNOSIS — D72820 Lymphocytosis (symptomatic): Secondary | ICD-10-CM | POA: Insufficient documentation

## 2023-07-29 DIAGNOSIS — Z825 Family history of asthma and other chronic lower respiratory diseases: Secondary | ICD-10-CM | POA: Insufficient documentation

## 2023-07-29 DIAGNOSIS — D508 Other iron deficiency anemias: Secondary | ICD-10-CM

## 2023-07-29 DIAGNOSIS — D225 Melanocytic nevi of trunk: Secondary | ICD-10-CM | POA: Diagnosis not present

## 2023-07-29 DIAGNOSIS — D2262 Melanocytic nevi of left upper limb, including shoulder: Secondary | ICD-10-CM | POA: Diagnosis not present

## 2023-07-29 DIAGNOSIS — Z8249 Family history of ischemic heart disease and other diseases of the circulatory system: Secondary | ICD-10-CM | POA: Diagnosis not present

## 2023-07-29 DIAGNOSIS — Z79899 Other long term (current) drug therapy: Secondary | ICD-10-CM | POA: Diagnosis not present

## 2023-07-29 DIAGNOSIS — D509 Iron deficiency anemia, unspecified: Secondary | ICD-10-CM | POA: Diagnosis not present

## 2023-07-29 DIAGNOSIS — Z8719 Personal history of other diseases of the digestive system: Secondary | ICD-10-CM | POA: Diagnosis not present

## 2023-07-29 DIAGNOSIS — R5383 Other fatigue: Secondary | ICD-10-CM | POA: Diagnosis not present

## 2023-07-29 DIAGNOSIS — L570 Actinic keratosis: Secondary | ICD-10-CM | POA: Diagnosis not present

## 2023-07-29 DIAGNOSIS — D2261 Melanocytic nevi of right upper limb, including shoulder: Secondary | ICD-10-CM | POA: Diagnosis not present

## 2023-07-29 DIAGNOSIS — Z809 Family history of malignant neoplasm, unspecified: Secondary | ICD-10-CM | POA: Diagnosis not present

## 2023-07-29 LAB — CBC WITH DIFFERENTIAL (CANCER CENTER ONLY)
Abs Immature Granulocytes: 0.02 10*3/uL (ref 0.00–0.07)
Basophils Absolute: 0 10*3/uL (ref 0.0–0.1)
Basophils Relative: 1 %
Eosinophils Absolute: 0.1 10*3/uL (ref 0.0–0.5)
Eosinophils Relative: 2 %
HCT: 35.6 % — ABNORMAL LOW (ref 39.0–52.0)
Hemoglobin: 12.1 g/dL — ABNORMAL LOW (ref 13.0–17.0)
Immature Granulocytes: 1 %
Lymphocytes Relative: 28 %
Lymphs Abs: 1.1 10*3/uL (ref 0.7–4.0)
MCH: 31 pg (ref 26.0–34.0)
MCHC: 34 g/dL (ref 30.0–36.0)
MCV: 91.3 fL (ref 80.0–100.0)
Monocytes Absolute: 0.3 10*3/uL (ref 0.1–1.0)
Monocytes Relative: 8 %
Neutro Abs: 2.3 10*3/uL (ref 1.7–7.7)
Neutrophils Relative %: 60 %
Platelet Count: 104 10*3/uL — ABNORMAL LOW (ref 150–400)
RBC: 3.9 MIL/uL — ABNORMAL LOW (ref 4.22–5.81)
RDW: 15.3 % (ref 11.5–15.5)
WBC Count: 3.8 10*3/uL — ABNORMAL LOW (ref 4.0–10.5)
nRBC: 0 % (ref 0.0–0.2)

## 2023-07-29 LAB — CMP (CANCER CENTER ONLY)
ALT: 22 U/L (ref 0–44)
AST: 20 U/L (ref 15–41)
Albumin: 4 g/dL (ref 3.5–5.0)
Alkaline Phosphatase: 175 U/L — ABNORMAL HIGH (ref 38–126)
Anion gap: 9 (ref 5–15)
BUN: 43 mg/dL — ABNORMAL HIGH (ref 8–23)
CO2: 26 mmol/L (ref 22–32)
Calcium: 8.9 mg/dL (ref 8.9–10.3)
Chloride: 100 mmol/L (ref 98–111)
Creatinine: 1.35 mg/dL — ABNORMAL HIGH (ref 0.61–1.24)
GFR, Estimated: 55 mL/min — ABNORMAL LOW (ref >60)
Glucose, Bld: 322 mg/dL — ABNORMAL HIGH (ref 70–99)
Potassium: 4.5 mmol/L (ref 3.5–5.1)
Sodium: 135 mmol/L (ref 135–145)
Total Bilirubin: 0.7 mg/dL (ref 0.0–1.2)
Total Protein: 6.7 g/dL (ref 6.5–8.1)

## 2023-07-29 LAB — IMMATURE PLATELET FRACTION: Immature Platelet Fraction: 1.8 % (ref 1.2–8.6)

## 2023-07-29 LAB — IRON AND TIBC
Iron: 61 ug/dL (ref 45–182)
Saturation Ratios: 17 % — ABNORMAL LOW (ref 17.9–39.5)
TIBC: 351 ug/dL (ref 250–450)
UIBC: 290 ug/dL

## 2023-07-29 LAB — FERRITIN: Ferritin: 157 ng/mL (ref 24–336)

## 2023-07-31 LAB — COMP PANEL: LEUKEMIA/LYMPHOMA: Immunophenotypic Profile: 0

## 2023-08-03 ENCOUNTER — Ambulatory Visit
Admission: RE | Admit: 2023-08-03 | Discharge: 2023-08-03 | Disposition: A | Discharge status: Home / Self Care | Source: Ambulatory Visit | Attending: Internal Medicine | Admitting: Internal Medicine

## 2023-08-03 DIAGNOSIS — K760 Fatty (change of) liver, not elsewhere classified: Secondary | ICD-10-CM | POA: Diagnosis not present

## 2023-08-05 ENCOUNTER — Inpatient Hospital Stay: Payer: Medicare HMO | Admitting: Oncology

## 2023-08-05 ENCOUNTER — Encounter: Payer: Self-pay | Admitting: Oncology

## 2023-08-05 VITALS — BP 178/56 | HR 56 | Temp 96.7°F | Resp 16 | Wt 259.4 lb

## 2023-08-05 DIAGNOSIS — R5383 Other fatigue: Secondary | ICD-10-CM | POA: Diagnosis not present

## 2023-08-05 DIAGNOSIS — D696 Thrombocytopenia, unspecified: Secondary | ICD-10-CM | POA: Diagnosis not present

## 2023-08-05 DIAGNOSIS — D508 Other iron deficiency anemias: Secondary | ICD-10-CM | POA: Diagnosis not present

## 2023-08-05 DIAGNOSIS — D72820 Lymphocytosis (symptomatic): Secondary | ICD-10-CM | POA: Diagnosis not present

## 2023-08-05 DIAGNOSIS — Z7902 Long term (current) use of antithrombotics/antiplatelets: Secondary | ICD-10-CM | POA: Diagnosis not present

## 2023-08-05 DIAGNOSIS — Z860101 Personal history of adenomatous and serrated colon polyps: Secondary | ICD-10-CM | POA: Diagnosis not present

## 2023-08-05 DIAGNOSIS — Z79899 Other long term (current) drug therapy: Secondary | ICD-10-CM | POA: Diagnosis not present

## 2023-08-05 DIAGNOSIS — D509 Iron deficiency anemia, unspecified: Secondary | ICD-10-CM | POA: Diagnosis not present

## 2023-08-05 DIAGNOSIS — R161 Splenomegaly, not elsewhere classified: Secondary | ICD-10-CM | POA: Diagnosis not present

## 2023-08-05 DIAGNOSIS — K760 Fatty (change of) liver, not elsewhere classified: Secondary | ICD-10-CM | POA: Diagnosis not present

## 2023-08-05 NOTE — Assessment & Plan Note (Addendum)
 Splenomegaly and Prominent/mildly enlarged mediastinal, hilar and retroperitoneal lymph nodes 03/19/2022 CT chest abdomen pelvis w contrast showed.improved thoracic adenopathy and spleen size.  Currently he has no constitutional symptoms, except mild sweat between legs at night.  Continue observation.

## 2023-08-05 NOTE — Assessment & Plan Note (Addendum)
 Labs reviewed and discussed with patient. Lab Results  Component Value Date   HGB 12.1 (L) 07/29/2023   TIBC 351 07/29/2023   IRONPCTSAT 17 (L) 07/29/2023   FERRITIN 157 07/29/2023     I will hold off additional IV Venofer treatment at this point. Recommend patient to continued oral iron supplementation for maintenance .

## 2023-08-05 NOTE — Progress Notes (Signed)
 Hematology/Oncology Progress note [phone] [phone]        CHIEF COMPLAINTS/REASON FOR VISIT:  Iron deficiency anemia splenomegaly, thrombocytopenia, monoclonal lymphocytosis   ASSESSMENT & PLAN:   IDA (iron deficiency anemia) Labs reviewed and discussed with patient. Lab Results  Component Value Date   HGB 12.1 (L) 07/29/2023   TIBC 351 07/29/2023   IRONPCTSAT 17 (L) 07/29/2023   FERRITIN 157 07/29/2023     I will hold off additional IV Venofer treatment at this point. Recommend patient to continued oral iron supplementation for maintenance .   Monoclonal B-cell lymphocytosis Splenomegaly and Prominent/mildly enlarged mediastinal, hilar and retroperitoneal lymph nodes 03/19/2022 CT chest abdomen pelvis w contrast showed.improved thoracic adenopathy and spleen size.  Currently he has no constitutional symptoms, except mild sweat between legs at night.  Continue observation.    Thrombocytopenia Due to splenomegaly. Stable counts, platelet count stable  at baseline.   Orders Placed This Encounter  Procedures   CMP (Cancer Center only)    Standing Status:   Future    Expected Date:   02/04/2024    Expiration Date:   08/04/2024   CBC with Differential (Cancer Center Only)    Standing Status:   Future    Expected Date:   02/04/2024    Expiration Date:   08/04/2024   Iron and TIBC    Standing Status:   Future    Expected Date:   02/04/2024    Expiration Date:   08/04/2024   Ferritin    Standing Status:   Future    Expected Date:   02/04/2024    Expiration Date:   08/04/2024   Retic Panel    Standing Status:   Future    Expected Date:   02/04/2024    Expiration Date:   08/04/2024   Follow up 6 months  All questions were answered. The patient knows to call the clinic with any problems, questions or concerns.  Dennis Forbes, MD, PhD [facility] Hematology Oncology 08/05/2023    HISTORY OF PRESENTING ILLNESS:   Dennis Wise is a  73 y.o.   male presents for follow-up of iron deficiency anemia, splenomegaly, thrombocytopenia pia, monoclonal lymphocytosis  03/06/2021, patient had blood work done.  CBC showed normal WBC of 4.6, hemoglobin 10.9, normal platelet count at 154,000.  Chemistry labs showed normal creatinine at 1.47 with a GFR of 47.3, normal bilirubin.  Potassium was elevated at 5.2.  Patient reports a history of MGUS.  Initially diagnosed by oncology Dr. Meredeth Stallion. 04/16/2021, kappa light chain ratio was elevated at 1.67.  04/17/2011, SPEP showed no monoclonal protein. 04/25/2011 24-hour urine protein 578. 04/25/2011 patient had a bone marrow biopsy and aspirate which showed a 3% of polyclonal plasma cells staining for kappa and lambda light chain and lack of large aggregates or sheets.  Overall changes are nonspecific.  Patient L was seen by oncology Dr. Vinay Gudena who did not feel that patient meets diagnostic criteria for MGUS and the patient was discharged.  06/04/2021, bone marrow biopsy showed hypercellular marrow with mild erythroid atypia.  Morphology features do not provide a precise explanation for patient's cytopenia.  Absence of significant morphological dysplasia makes an MDS less likely and alternative causes differential is wide.  B-cell gene rearrangement was detected.  This is likely secondary to monoclonal lymphocytosis.  And T-cell gene arrangement were detected-this is likely reactive  MDS FISH panel is negative. NGS negative.  07/19/2021, CT images were reviewed and discussed with patient. -Patient has  prominent/mildly enlarged mediastinal hilar and retroperitoneal lymph nodes.  Nonspecific.  Reactive versus malignant.-  # RF is elevated.  Patient has establish care with rheumatology Dr. Allena Katz and had autoimmune disease work-up to Dr.Patel did not feel he has RA   Patient establish care with pulmonology Dr. Karna Christmas for abnormal CT findings.  Differential includes [name], hypersensitivity pneumonitis.  He was treated with a course of prednisone  May 2024 he had a stroke. He is on Plavix.   INTERVAL HISTORY Dennis Wise is a 73 y.o. male who has above history reviewed by me today presents for follow up visit for management of Iron deficiency anemia splenomegaly, thrombocytopenia, monoclonal lymphocytosis Patient was accompanied by wife.  Chronic fatigue, No weight loss. He has sweat between his legs at night.    Review of Systems  Constitutional:  Positive for fatigue. Negative for appetite change, chills, fever and unexpected weight change.  HENT:   Negative for hearing loss and voice change.   Eyes:  Negative for eye problems and icterus.  Respiratory:  Negative for chest tightness, cough and shortness of breath.   Cardiovascular:  Negative for chest pain and leg swelling.  Gastrointestinal:  Negative for abdominal distention and abdominal pain.  Endocrine: Negative for hot flashes.  Genitourinary:  Positive for frequency. Negative for difficulty urinating and dysuria.   Musculoskeletal:  Negative for arthralgias.  Skin:  Negative for itching and rash.  Neurological:  Negative for light-headedness and numbness.  Hematological:  Negative for adenopathy. Does not bruise/bleed easily.  Psychiatric/Behavioral:  Negative for confusion.     MEDICAL HISTORY:  Past Medical History:  Diagnosis Date   Actinic keratosis 10/16/2013   Bilateral carotid artery stenosis 09/23/2021   Less than 50% bilaterally 2023   Combined form of senile cataract of both eyes 05/02/2016   Diabetic retinopathy 03/21/2005   Dupuytren contracture 03/03/2019   Essential hypertension 11/20/1999   Last Assessment & Plan: Change to 5mg  ramipril and see if the lightheaded troubles get better.  He'll update me as needed.   [name]    Generalized anxiety disorder 10/01/2022   GERD (gastroesophageal reflux disease) 01/1989   Hearing loss 10/20/2014   Helicobacter pylori gastritis 06/11/2011   On EGD 05/2011     Hemorrhagic disorder due to circulating anticoagulants 09/27/2022   History of colonic polyps 06/05/2012   06/05/2011 - 12 polyps max 7 mm, at least 9 adenomas  04/07/2013 - no polyps - repeat colonoscopy 2019 Iva Boop, [facility], [facility]        IMO SNOMED Dx Update Oct 2024     Hyperlipidemia    Internal hemorrhoids    Iron deficiency anemia, unspecified 03/04/2011   Ischemic right PCA stroke 09/05/2022   Left homonymous hemianopsia 08/2022   Caused by right PCA stroke   MGUS (monoclonal gammopathy of unknown significance)    possible dx initially, had negative f/u   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  He agrees.   Shoulder pain 07/02/2022   Syncope 11/22/2021   Thrombocytopenia 05/03/2021   Tibial fracture 10/24/2010   L tibia.  Repair 2012.  L tibia.  Repair 2012.  Last Assessment & Plan:  Per ortho.     Type II diabetes mellitus 04/21/1988   With h/o dec in sensation on feet     Unstable ankle 09/12/2020    SURGICAL HISTORY: Past Surgical History:  Procedure Laterality Date   CATARACT EXTRACTION Bilateral    COLONOSCOPY     ESOPHAGOGASTRODUODENOSCOPY     2012 H. pylori gastritis   FLEXIBLE SIGMOIDOSCOPY  05/1988   internal hemorrhoids   KNEE SURGERY  12/2002   lt knee fx repair ORIF   TIBIA FRACTURE SURGERY     left 2012    SOCIAL HISTORY: Social History   Socioeconomic History   Marital status: Married    Spouse name: Not on file   Number of children: 1   Years of education: 12   Highest education level: High school graduate  Occupational History   Occupation:  Metallurgist  Tobacco Use   Smoking status: Never   Smokeless tobacco: Never  Vaping Use   Vaping status: Never Used  Substance and Sexual Activity   Alcohol use: Never   Drug use: No   Sexual activity: Yes    Birth control/protection: None  Other Topics Concern   Not on file  Social History Narrative   Prev traveling for sports broadcasting    Working for [employer], [employer] as of 2019   Married 1970   1 child   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   Right handed   One floor home   Drinks caffeine   Lives with wife   Social Drivers of Health   Financial Resource Strain: Low Risk  (05/05/2023)   Overall Financial Resource Strain (CARDIA)    Difficulty of Paying Living Expenses: Not hard at all  Food Insecurity: No Food Insecurity (05/05/2023)   Hunger Vital Sign    Worried About Running Out of Food in the Last Year: Never true    Ran Out of Food in the Last Year: Never true  Transportation Needs: No Transportation Needs (05/05/2023)   PRAPARE - Administrator, Civil Service (Medical): No    Lack of Transportation (Non-Medical): No  Physical Activity: Sufficiently Active (05/05/2023)   Exercise Vital Sign    Days of Exercise per Week: 7 days    Minutes of Exercise per Session: 30 min  Recent Concern: Physical Activity - Insufficiently Active (03/11/2023)   Exercise Vital Sign    Days of Exercise per Week: 6 days    Minutes of Exercise per Session: 10 min  Stress: No Stress Concern Present (05/05/2023)   [employer] of Occupational Health - Occupational Stress Questionnaire    Feeling of Stress : Only a little  Social Connections: Moderately Integrated (05/05/2023)   Social Connection and Isolation Panel [NHANES]    Frequency of Communication with Friends and Family: More than three times a week    Frequency of Social Gatherings with Friends and Family: Once a week    Attends Religious Services: More than 4 times per year    Active Member of [employer] or  Organizations: No    Attends Banker Meetings: Never    Marital Status: Married  Recent Concern: Social Connections - Moderately Isolated (03/11/2023)   Social Connection and Isolation Panel [NHANES]    Frequency of Communication with Friends and Family: Three times a week    Frequency of Social Gatherings with Friends and Family: Twice a week    Attends Religious Services: Never  Active Member of Clubs or Organizations: No    Attends Banker Meetings: Never    Marital Status: Married  Catering manager Violence: Not At Risk (05/05/2023)   Humiliation, Afraid, Rape, and Kick questionnaire    Fear of Current or Ex-Partner: No    Emotionally Abused: No    Physically Abused: No    Sexually Abused: No    FAMILY HISTORY: Family History  Problem Relation Age of Onset   Cancer Mother        ?   Diabetes Mother    Heart failure Mother    Emphysema Father    Cancer Father        ?   Alzheimer's disease Father    Dementia Father    Skin cancer Brother    Diabetes Brother    Colon cancer Neg Hx    Prostate cancer Neg Hx    Esophageal cancer Neg Hx    Rectal cancer Neg Hx    Stomach cancer Neg Hx     ALLERGIES:  is allergic to hydralazine and promethazine hcl.  MEDICATIONS:  Current Outpatient Medications  Medication Sig Dispense Refill   acetaminophen (TYLENOL) 325 MG tablet Take 2 tablets (650 mg total) by mouth every 4 (four) hours as needed for mild pain (or temp > 37.5 Dennis (99.5 F)). 20 tablet 0   albuterol (VENTOLIN HFA) 108 (90 Base) MCG/ACT inhaler Inhale 2 puffs into the lungs every 6 (six) hours as needed for wheezing or shortness of breath. 8 g 0   atorvastatin (LIPITOR) 10 MG tablet Take 1 tablet (10 mg total) by mouth at bedtime. 90 tablet 10   clopidogrel (PLAVIX) 75 MG tablet Take 1 tablet (75 mg total) by mouth daily. 90 tablet 3   glimepiride (AMARYL) 2 MG tablet Take 1 tablet (2 mg total) by mouth daily. 90 tablet 10   IRON-VITAMIN Dennis PO  Take by mouth daily. 60 mg tablet     losartan (COZAAR) 100 MG tablet Take 1 tablet (100 mg total) by mouth daily. 90 tablet 3   memantine (NAMENDA) 5 MG tablet Take 1 tablet (5 mg total) by mouth 2 (two) times daily. 60 tablet 11   metFORMIN (GLUCOPHAGE) 850 MG tablet Take 1 tablet (850 mg total) by mouth 2 (two) times daily. 180 tablet 10   Multiple Vitamin (MULTIVITAMIN) tablet Take 1 tablet by mouth daily. Centrum Silver     niacin (NIASPAN) 500 MG CR tablet Take 3 tablets (1,500 mg total) by mouth daily.     oxyCODONE (OXY IR/ROXICODONE) 5 MG immediate release tablet Take by mouth 2 (two) times daily.     pioglitazone (ACTOS) 45 MG tablet Take 1 tablet (45 mg total) by mouth daily. 90 tablet 10   tamsulosin (FLOMAX) 0.4 MG CAPS capsule Take 1 capsule (0.4 mg total) by mouth daily. 90 capsule 3   No current facility-administered medications for this visit.     PHYSICAL EXAMINATION: ECOG PERFORMANCE STATUS: 1 - Symptomatic but completely ambulatory Vitals:   08/05/23 1012  BP: (!) 178/56  Pulse: (!) 56  Resp: 16  Temp: (!) 96.7 F (35.9 Dennis)  SpO2: 100%   Filed Weights   08/05/23 1012  Weight: 259 lb 6.4 oz (117.7 kg)    Physical Exam Constitutional:      General: He is not in acute distress.    Appearance: He is obese.  HENT:     Head: Normocephalic and atraumatic.  Eyes:     General:  No scleral icterus. Cardiovascular:     Rate and Rhythm: Normal rate and regular rhythm.     Heart sounds: Normal heart sounds.  Pulmonary:     Effort: Pulmonary effort is normal. No respiratory distress.     Breath sounds: Normal breath sounds. No wheezing.  Abdominal:     General: Bowel sounds are normal. There is no distension.     Palpations: Abdomen is soft.  Musculoskeletal:        General: Normal range of motion.     Cervical back: Normal range of motion and neck supple.     Right lower leg: Edema present.     Left lower leg: Edema present.  Skin:    General: Skin is warm  and dry.     Findings: No erythema or rash.  Neurological:     Mental Status: He is alert and oriented to person, place, and time. Mental status is at baseline.  Psychiatric:        Mood and Affect: Mood normal.     LABORATORY DATA:  I have reviewed the data as listed    Latest Ref Rng & Units 07/29/2023    8:58 AM 01/28/2023    8:39 AM 09/30/2022   12:37 PM  CBC  WBC 4.0 - 10.5 K/uL 3.8  5.1  5.2   Hemoglobin 13.0 - 17.0 g/dL 78.2  95.6  21.3   Hematocrit 39.0 - 52.0 % 35.6  33.9  32.5   Platelets 150 - 400 K/uL 104  80  127.0       Latest Ref Rng & Units 07/29/2023    8:58 AM 02/19/2023    8:02 AM 02/11/2023   10:10 AM  CMP  Glucose 70 - 99 mg/dL 086  578    BUN 8 - 23 mg/dL 43  32    Creatinine 4.69 - 1.24 mg/dL 6.29  5.28    Sodium 413 - 145 mmol/L 135  140    Potassium 3.5 - 5.1 mmol/L 4.5  4.4    Chloride 98 - 111 mmol/L 100  101    CO2 22 - 32 mmol/L 26  29    Calcium 8.9 - 10.3 mg/dL 8.9  9.4    Total Protein 6.5 - 8.1 g/dL 6.7   6.9   Total Bilirubin 0.0 - 1.2 mg/dL 0.7   0.8   Alkaline Phos 38 - 126 U/L 175   140   AST 15 - 41 U/L 20   18   ALT 0 - 44 U/L 22   15      Lab Results  Component Value Date   IRON 61 07/29/2023   TIBC 351 07/29/2023   FERRITIN 157 07/29/2023     RADIOGRAPHIC STUDIES: I have personally reviewed the radiological images as listed and agreed with the findings in the report. [place] ELASTOGRAPHY LIVER Result Date: 08/03/2023 CLINICAL DATA:  NAFLD EXAM: [place] ELASTOGRAPHY HEPATIC TECHNIQUE: Sonography of the liver was performed. In addition, ultrasound elastography evaluation of the liver was performed. A region of interest was placed within the right lobe of the liver. Following application of a compressive sonographic pulse, tissue compressibility was assessed. Multiple assessments were performed at the selected site. Median tissue compressibility was determined. Previously, hepatic stiffness was assessed by shear wave velocity. Based on  recently published Society of Radiologists in Ultrasound consensus article, reporting is now recommended to be performed in the SI units of pressure (kiloPascals) representing hepatic stiffness/elasticity. The obtained result is compared to  the published reference standards. (cACLD = compensated Advanced Chronic Liver Disease) COMPARISON:  August 12, 2022 FINDINGS: Liver: No focal lesion. Increased echotexture. Portal vein is patent on color Doppler imaging with normal direction of blood flow towards the liver. ULTRASOUND HEPATIC ELASTOGRAPHY Device: Siemens Helix VTQ Patient position: Supine Transducer: 9 C2 Number of measurements: 10 Hepatic segment:  8 Median kPa: 3.4 IQR: 0.2 IQR/Median kPa ratio: 0.1 Data quality:  Good Diagnostic category:  < or = 5 kPa: high probability of being normal The use of hepatic elastography is applicable to patients with viral hepatitis and non-alcoholic fatty liver disease. At this time, there is insufficient data for the referenced cut-off values and use in other causes of liver disease, including alcoholic liver disease. Patients, however, may be assessed by elastography and serve as their own reference standard/baseline. In patients with non-alcoholic liver disease, the values suggesting compensated advanced chronic liver disease (cACLD) may be lower, and patients may need additional testing with elasticity results of 7-9 kPa. Please note that abnormal hepatic elasticity and shear wave velocities may also be identified in clinical settings other than with hepatic fibrosis, such as: acute hepatitis, elevated right heart and central venous pressures including use of beta blockers, veno-occlusive disease (Budd-Chiari), infiltrative processes such as mastocytosis/amyloidosis/infiltrative tumor/lymphoma, extrahepatic cholestasis, with hyperemia in the post-prandial state, and with liver transplantation. Correlation with patient history, laboratory data, and clinical condition  recommended. Diagnostic Categories: < or =5 kPa: high probability of being normal < or =9 kPa: in the absence of other known clinical signs, rules out cACLD >9 kPa and ?13 kPa: suggestive of cACLD, but needs further testing >13 kPa: highly suggestive of cACLD > or =17 kPa: highly suggestive of cACLD with an increased probability of clinically significant portal hypertension IMPRESSION: ULTRASOUND LIVER: Increased echotexture of the liver consistent with fatty infiltration of liver. ULTRASOUND HEPATIC ELASTOGRAPHY: Median kPa:  3.4 Diagnostic category:  < or = 5 kPa: high probability of being normal In the setting of elevated liver function tests, non-fasting state, or vascular congestion, the stage of liver fibrosis may be overestimated. In some patients with NAFLD, the cut-off values for cACLD may be lower (7-9 kPa). In causes other than viral hepatitis and NAFLD, the cut-off values are not well established. Electronically Signed   By: Anna Barnes M.D.   On: 08/03/2023 11:02

## 2023-08-05 NOTE — Assessment & Plan Note (Addendum)
 Due to splenomegaly. Stable counts, platelet count stable  at baseline.

## 2023-08-06 ENCOUNTER — Encounter: Payer: Self-pay | Admitting: Psychiatry

## 2023-08-06 ENCOUNTER — Other Ambulatory Visit: Payer: Self-pay

## 2023-08-06 ENCOUNTER — Ambulatory Visit: Admitting: Psychiatry

## 2023-08-06 VITALS — BP 130/72 | HR 52 | Temp 98.1°F | Ht 73.0 in | Wt 263.0 lb

## 2023-08-06 DIAGNOSIS — F99 Mental disorder, not otherwise specified: Secondary | ICD-10-CM

## 2023-08-06 DIAGNOSIS — F411 Generalized anxiety disorder: Secondary | ICD-10-CM | POA: Diagnosis not present

## 2023-08-06 DIAGNOSIS — F331 Major depressive disorder, recurrent, moderate: Secondary | ICD-10-CM | POA: Diagnosis not present

## 2023-08-06 DIAGNOSIS — F4321 Adjustment disorder with depressed mood: Secondary | ICD-10-CM | POA: Diagnosis not present

## 2023-08-06 DIAGNOSIS — F5105 Insomnia due to other mental disorder: Secondary | ICD-10-CM | POA: Diagnosis not present

## 2023-08-06 MED ORDER — TRAZODONE HCL 50 MG PO TABS: 25.0000 mg | ORAL_TABLET | Freq: Every day | ORAL | 0 refills | Status: DC

## 2023-08-06 MED ORDER — ESCITALOPRAM OXALATE 5 MG PO TABS
5.0000 mg | ORAL_TABLET | Freq: Every day | ORAL | 0 refills | Status: DC
Start: 2023-08-06 — End: 2023-09-09

## 2023-08-07 DIAGNOSIS — H02834 Dermatochalasis of left upper eyelid: Secondary | ICD-10-CM | POA: Diagnosis not present

## 2023-08-07 DIAGNOSIS — H4311 Vitreous hemorrhage, right eye: Secondary | ICD-10-CM | POA: Diagnosis not present

## 2023-08-07 DIAGNOSIS — H02831 Dermatochalasis of right upper eyelid: Secondary | ICD-10-CM | POA: Diagnosis not present

## 2023-08-07 DIAGNOSIS — E113513 Type 2 diabetes mellitus with proliferative diabetic retinopathy with macular edema, bilateral: Secondary | ICD-10-CM | POA: Diagnosis not present

## 2023-08-07 DIAGNOSIS — Z961 Presence of intraocular lens: Secondary | ICD-10-CM | POA: Diagnosis not present

## 2023-08-07 DIAGNOSIS — H53462 Homonymous bilateral field defects, left side: Secondary | ICD-10-CM | POA: Diagnosis not present

## 2023-08-07 DIAGNOSIS — H40003 Preglaucoma, unspecified, bilateral: Secondary | ICD-10-CM | POA: Diagnosis not present

## 2023-08-07 NOTE — Progress Notes (Cosign Needed)
 Psychiatric Initial Adult Assessment   Patient Identification: Dennis Wise MRN:  [id number] Date of Evaluation:  08/07/2023 Referral Source: Richrd Char, MD Chief Complaint:   Chief Complaint  Patient presents with   Establish Care   Visit Diagnosis:    ICD-10-CM   1. Moderate episode of recurrent major depressive disorder (HCC)  F33.1     2. Generalized anxiety disorder  F41.1     3. Insomnia due to other mental disorder  F51.05    F99     4. Grief  F43.21       History of Present Illness: A 73 year old male presenting to [facility] for medication management establish care.  Patient was referred due to his increased depression symptoms that he states is caused by an accident that he experienced a year ago in which he tripped over tables and injured his shoulder which resulted in a surgery in January 25.  Patient is currently on Workmen's Comp. stating that he lost his privileges to drive as well due to his stroke in March 2024.  Patient reports that he had lost a portion of his field vision due to the stroke and was then at his driving privileges taken away by neurologist who was overseeing his stroke.  This has been a major stressor as he has been previously a Chief Operating Officer in [employer] in which she was working for [employer] while having to perform work that he was satisfied and finding to fill minute.  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  Patient reports that since this accident he has had much distress between him and his wife due to his wife stating that he just needs to retire after his accident.  He also stresses the major contributor of his depression is due to his wife "focusing "on his vision and not being able to drive anywhere after to doctors that he is unable to drive.  Based on this assessment interview patient will be diagnosed with  major depressive disorder, recurrent, moderate, generalized anxiety disorder, insomnia due to a mental disorder, and grief due to loss of lifestyle.  Due to patient's depression and anxiety patient is recommended to take Lexapro  5 mg once daily and will be evaluated in 1 month for therapeutic effects.  Patient has been educated on the initial nervousness and dizziness with the medication and has been instructed to call the clinic should he experience side effects the last longer than a week.  Due to patient's insomnia patient will start on trazodone  25 mg once a day before bed, and has been educated on taking the medication before bed due to sedation.  Patient is encouraged to continue psychotherapy but he reports that his wife is not in agreement with it and may have to stop due to his wife stating is a waste of money.  Patient will follow up in 1 month the patient has been instructed should symptoms become worse before the next appointment to please call the clinic.  Patient with no other questions or concerns.  Associated Signs/Symptoms: Depression Symptoms:  anhedonia, insomnia, fatigue, hopelessness, anxiety, loss of energy/fatigue, disturbed sleep, (Hypo) Manic Symptoms: Negative Anxiety Symptoms:  Excessive Worry, Psychotic Symptoms: Negative PTSD Symptoms: Negative  Past Psychiatric History: Previous Psych Hospitalizations: Denies  Outpatient treatment: Currently participating in outpatient therapy in which his wife is not in agreement with.  Medications Current: -  No current psychotropic meds  Medication Trials: - No previous med trials Suicide & Violence: - Patient denies SI, HI, AVH.  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  Patient reports the rifle will remain in his health home it will not be relocated.  The patient verbalized understanding that should he have less  suicidal thoughts to thoughts or #1 go to emergency department Psychotherapy: Patient currently participates in outpatient therapy, in which that his wife is not in agreement with due to the co-pay as well as in her opinion shall, the wife and husband currently have disagreements regarding care.  Legal: Denies  Previous Psychotropic Medications: No   Substance Abuse History in the last 12 months:  No.  Consequences of Substance Abuse: Negative  Past Medical History:  Past Medical History:  Diagnosis Date   Actinic keratosis 10/16/2013   Bilateral carotid artery stenosis 09/23/2021   Less than 50% bilaterally 2023   Combined form of senile cataract of both eyes 05/02/2016   Diabetic retinopathy 03/21/2005   Dupuytren contracture 03/03/2019   Essential hypertension 11/20/1999   Last Assessment & Plan: Change to 5mg  ramipril  and see if the lightheaded troubles get better.  He'll update me as needed.   [name]    Generalized anxiety disorder 10/01/2022   GERD (gastroesophageal reflux disease) 01/1989   Hearing loss 10/20/2014   Helicobacter pylori gastritis 06/11/2011   On EGD 05/2011    Hemorrhagic disorder due to circulating anticoagulants 09/27/2022   History of colonic polyps 06/05/2012   06/05/2011 - 12 polyps max 7 mm, at least 9 adenomas  04/07/2013 - no polyps - repeat colonoscopy 2019 Kenney Peacemaker, [facility], [facility]        IMO SNOMED Dx Update Oct 2024     Hyperlipidemia    Internal hemorrhoids    Iron  deficiency anemia, unspecified 03/04/2011   Ischemic right PCA stroke 09/05/2022   Left homonymous hemianopsia 08/2022   Caused by right PCA stroke   MGUS (monoclonal gammopathy of unknown significance)    possible dx initially, had negative f/u   Mild vascular neurocognitive disorder 05/07/2023   Monoclonal B-cell lymphocytosis 01/15/2022   Nocturia 08/18/2021   Normocytic anemia 04/03/2021   Obesity (BMI 30-39.9) 10/16/2013   Pain in joint of left knee 09/27/2022    Pancytopenia, acquired 05/01/2011   Pneumonia    Pseudophakia, both eyes 07/17/2016   Pure hypercholesterolemia 04/21/1988   Last Assessment & Plan: Continue statin, continue work on diet, d/w pt about labs.  He agrees.   Shoulder pain 07/02/2022   Syncope 11/22/2021   Thrombocytopenia 05/03/2021   Tibial fracture 10/24/2010   L tibia.  Repair 2012.    L tibia.  Repair 2012.  Last Assessment & Plan:  Per ortho.     Type II diabetes mellitus 04/21/1988   With h/o dec in sensation on feet     Unstable ankle 09/12/2020    Past Surgical History:  Procedure Laterality Date   CATARACT EXTRACTION Bilateral    COLONOSCOPY     ESOPHAGOGASTRODUODENOSCOPY     2012 H. pylori gastritis   FLEXIBLE SIGMOIDOSCOPY  05/1988   internal hemorrhoids   KNEE SURGERY  12/2002   lt knee fx repair ORIF   TIBIA FRACTURE SURGERY     left 2012    Family Psychiatric History: Patient denies any family history  Family History:  Family History  Problem Relation Age of Onset   Cancer Mother        ?   Diabetes Mother    Heart failure Mother    Emphysema Father    Cancer Father        ?   Alzheimer's disease Father    Dementia Father    Skin cancer Brother    Diabetes Brother    Colon cancer Neg Hx    Prostate cancer Neg Hx    Esophageal cancer Neg Hx    Rectal cancer Neg Hx    Stomach cancer Neg Hx     Social History:   Social History   Socioeconomic History   Marital status: Married    Spouse name: Not on file   Number of children: 1   Years of education: 12   Highest education level: High school graduate  Occupational History   Occupation: Metallurgist  Tobacco Use   Smoking status: Never   Smokeless tobacco: Never  Vaping Use   Vaping status: Never Used  Substance and Sexual Activity   Alcohol use: Never   Drug use: No   Sexual activity: Yes    Birth control/protection: None  Other Topics Concern   Not on file  Social History Narrative   Prev traveling for sports  broadcasting    Working for [employer], [employer] as of 2019   Married 1970   1 child   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   Right handed   One floor home   Drinks caffeine   Lives with wife   Social Drivers of Health   Financial Resource Strain: Low Risk  (05/05/2023)   Overall Financial Resource Strain (CARDIA)    Difficulty of Paying Living Expenses: Not hard at all  Food Insecurity: No Food Insecurity (05/05/2023)   Hunger Vital Sign    Worried About Running Out of Food in the Last Year: Never true    Ran Out of Food in the Last Year: Never true  Transportation Needs: No Transportation Needs (05/05/2023)   PRAPARE - Administrator, Civil Service (Medical): No    Lack of Transportation (Non-Medical): No  Physical Activity: Sufficiently Active (05/05/2023)   Exercise Vital Sign    Days of Exercise per Week: 7 days    Minutes of Exercise per Session: 30 min  Recent Concern: Physical Activity - Insufficiently Active (03/11/2023)   Exercise Vital Sign    Days of Exercise per Week: 6 days    Minutes of Exercise per Session: 10 min  Stress: No Stress Concern Present (05/05/2023)   [employer] of Occupational Health - Occupational Stress Questionnaire    Feeling of Stress : Only a little  Social Connections: Moderately Integrated (05/05/2023)   Social Connection and Isolation Panel [NHANES]    Frequency of Communication with Friends and Family: More than three times a week    Frequency of Social Gatherings with Friends and Family: Once a week    Attends Religious Services: More than 4 times per year    Active Member of [employer] or Organizations: No    Attends Banker Meetings: Never    Marital Status: Married  Recent Concern: Social Connections - Moderately Isolated (03/11/2023)   Social Connection and Isolation Panel [NHANES]    Frequency of Communication with Friends and Family: Three times a week    Frequency of Social Gatherings with Friends and Family: Twice a  week    Attends Religious Services:  Never    Active Member of Clubs or Organizations: No    Attends Banker Meetings: Never    Marital Status: Married    Additional Social History: No additional history  Allergies:   Allergies  Allergen Reactions   Hydralazine      Short of breath.     Promethazine Hcl     REACTION: AGITIATION    Metabolic Disorder Labs: Lab Results  Component Value Date   HGBA1C 7.4 (A) 05/29/2023   MPG 177.16 09/07/2022   No results found for: "PROLACTIN" Lab Results  Component Value Date   CHOL 165 02/19/2023   TRIG 77.0 02/19/2023   HDL 97.00 02/19/2023   CHOLHDL 2 02/19/2023   VLDL 15.4 02/19/2023   LDLCALC 53 02/19/2023   LDLCALC 59 09/06/2022   Lab Results  Component Value Date   TSH 3.23 09/01/2022    Therapeutic Level Labs: No results found for: "LITHIUM" No results found for: "CBMZ" No results found for: "VALPROATE"  Current Medications: Current Outpatient Medications  Medication Sig Dispense Refill   acetaminophen  (TYLENOL ) 325 MG tablet Take 2 tablets (650 mg total) by mouth every 4 (four) hours as needed for mild pain (or temp > 37.5 C (99.5 F)). 20 tablet 0   albuterol  (VENTOLIN  HFA) 108 (90 Base) MCG/ACT inhaler Inhale 2 puffs into the lungs every 6 (six) hours as needed for wheezing or shortness of breath. 8 g 0   atorvastatin  (LIPITOR) 10 MG tablet Take 1 tablet (10 mg total) by mouth at bedtime. 90 tablet 10   clopidogrel  (PLAVIX ) 75 MG tablet Take 1 tablet (75 mg total) by mouth daily. 90 tablet 3   escitalopram  (LEXAPRO ) 5 MG tablet Take 1 tablet (5 mg total) by mouth daily. 30 tablet 0   glimepiride  (AMARYL ) 2 MG tablet Take 1 tablet (2 mg total) by mouth daily. 90 tablet 10   IRON -VITAMIN C PO Take by mouth daily. 60 mg tablet     losartan  (COZAAR ) 100 MG tablet Take 1 tablet (100 mg total) by mouth daily. 90 tablet 3   memantine  (NAMENDA ) 5 MG tablet Take 1 tablet (5 mg total) by mouth 2 (two) times daily. 60  tablet 11   metFORMIN  (GLUCOPHAGE ) 850 MG tablet Take 1 tablet (850 mg total) by mouth 2 (two) times daily. 180 tablet 10   Multiple Vitamin (MULTIVITAMIN) tablet Take 1 tablet by mouth daily. Centrum Silver     niacin  (NIASPAN ) 500 MG CR tablet Take 3 tablets (1,500 mg total) by mouth daily.     oxyCODONE (OXY IR/ROXICODONE) 5 MG immediate release tablet Take by mouth 2 (two) times daily.     pioglitazone  (ACTOS ) 45 MG tablet Take 1 tablet (45 mg total) by mouth daily. 90 tablet 10   tamsulosin  (FLOMAX ) 0.4 MG CAPS capsule Take 1 capsule (0.4 mg total) by mouth daily. 90 capsule 3   traZODone  (DESYREL ) 50 MG tablet Take 0.5 tablets (25 mg total) by mouth at bedtime. 30 tablet 0   No current facility-administered medications for this visit.    Musculoskeletal: Strength & Muscle Tone: within normal limits Gait & Station: normal Patient leans: Right  Psychiatric Specialty Exam: Review of Systems  Constitutional: Negative.   HENT: Negative.    Eyes: Negative.   Respiratory: Negative.    Cardiovascular: Negative.   Gastrointestinal: Negative.   Endocrine: Negative.   Genitourinary: Negative.   Musculoskeletal: Negative.   Skin: Negative.   Allergic/Immunologic: Negative.   Neurological: Negative.   Hematological:  Negative.   Psychiatric/Behavioral:  Positive for dysphoric mood. The patient is nervous/anxious.     Blood pressure 130/72, pulse (!) 52, temperature 98.1 F (36.7 C), temperature source Temporal, height 6\' 1"  (1.854 m), weight 263 lb (119.3 kg).Body mass index is 34.7 kg/m.  General Appearance: Casual  Eye Contact:  Good  Speech:  Clear and Coherent  Volume:  Normal  Mood:  Depressed  Affect:  Depressed  Thought Process:  Coherent  Orientation:  Full (Time, Place, and Person)  Thought Content:  Logical  Suicidal Thoughts:  No  Homicidal Thoughts:  No  Memory:  Immediate;   Good Recent;   Good Remote;   Good  Judgement:  Good  Insight:  Good  Psychomotor  Activity:  Normal  Concentration:  Concentration: Good and Attention Span: Good  Recall:  Good  Fund of Knowledge:Good  Language: Good  Akathisia:  No  Handed:  Right  AIMS (if indicated):    Assets:  Financial Resources/Insurance Housing Vocational/Educational  ADL's:  Intact  Cognition: WNL  Sleep:  Poor   Screenings: GAD-7    Flowsheet Row Office Visit from [facility]2023 in [facility] [place] Office Visit from 03/13/2023 in [facility] [place] Office Visit from 02/26/2023 in [facility] [place] Office Visit from 01/01/2023 in [facility] [place] Office Visit from 09/30/2022 in [facility]  Total GAD-7 Score 4 2 5 5 10       Mini-Mental    Flowsheet Row Office Visit from 03/23/2023 in [facility] Clinical Support from 01/30/2017 in [facility] Clinical Support from 10[facility]22/2015 in [facility]  Total Score (max 30 points ) 28 20 20       PHQ2-9    Flowsheet Row Office Visit from 08/06/2023 in [facility] Office Visit from 05/29/2023 in [facility] Clinical Support from 05/05/2023 in [facility] Office Visit from 03/13/2023 in [facility] Office Visit from 02/26/2023 in [facility] [place]  PHQ-2 Total Score 4 0 1 0 1  PHQ-9 Total Score 10 0 1 0 4      Flowsheet Row Office Visit from 08/06/2023 in [facility] ED to Hosp-Admission (Discharged) from 09/05/2022 in [facility] from 11/19/2021 in [facility] Emergency Department at [facility]  C-SSRS RISK CATEGORY No Risk No Risk No Risk       Assessment and Plan:  Assessment -  Diagnosis: Moderate episode of recurrent major depressive disorder (HCC) [F33.1]  2. Generalized anxiety disorder [F41.1] 3. Insomnia due to other mental disorder [F51.05, F99]  4. Grief [F43.21]   - Progress: Baseline Appointment  - Risk Factors: Worsening symptoms  Plan - Medications:  Start taking Lexapro  5mg  po once day for depression and anxiety. Pt educated on the initial nervousness and hedaches, instructed to contact clinic if symptoms last longer than a week.  Start taking trazodone  25 mg at night sleep.  Patient has been instructed on sedation of this medication instructed to take at night. - Psychotherapy: Patient currently participating in psychotherapy outside of [facility] - Education: Patient has been educated on grief especially and is notes of experiencing grief in relation to loss of his lifestyle due  to medical conditions and accident.  Patient was displaying an reminiscent therapy as he was encouraged to talk about his past as well as his experience and knowledge.  She has been instructed her medication purpose side effects and adverse reactions. - Follow-Up: Patient will follow up in 1 month - Referrals: No referrals - Safety Planning: The patient has been educated, if they should have suicidal thoughts with or without a plan to call 911, or go to the closest emergency department.  Pt verbalized understanding.  Pt endorses having a firearm at home but reports that it is locked up in the state that he will call 911 or go to the emergency department if he has suicidal thoughts with a plan..  Pt also agrees to call the clinic should they have worsening symptoms before the next appointment.      Patient/Guardian was advised Release of Information must be obtained prior to any record release in order to collaborate their care with an outside provider. Patient/Guardian was advised if they have not already done so to contact the registration department to sign all necessary forms in  order for us  to release information regarding their care.   Consent: Patient/Guardian gives verbal consent for treatment and assignment of benefits for services provided during this visit. Patient/Guardian expressed understanding and agreed to proceed.   This office note has been dictated. This dictation was prepared using Air traffic controller. As a result, errors may occur. When identified, these errors have been corrected. While every attempt is made to correct errors during dictation, errors may still exist.   Arlana Labor, NP 4/18/20257:49 AM  Chart reviewed and case discussed with provider. Agree with findings and treatment plan.   Syed Arfeen, MD

## 2023-08-12 ENCOUNTER — Ambulatory Visit: Admitting: Clinical

## 2023-08-12 DIAGNOSIS — F411 Generalized anxiety disorder: Secondary | ICD-10-CM | POA: Diagnosis not present

## 2023-08-12 NOTE — Progress Notes (Signed)
 [facility] Counselor/Therapist Progress Note  Patient ID: Dennis Wise, MRN: [id number]    Date: 08/12/23  Time Spent: 1:37  pm - 2:24 pm : 47 Minutes  Treatment Type: Individual Therapy.  Reported Symptoms: difficulty falling asleep  Mental Status Exam: Appearance:  Neat and Well Groomed     Behavior: Appropriate  Motor: Normal  Speech/Language:  Clear and Coherent and Normal Rate  Affect: Appropriate  Mood: normal  Thought process: normal  Thought content:   WNL  Sensory/Perceptual disturbances:   WNL  Orientation: oriented to person, place, and situation  Attention: Good  Concentration: Good  Memory: WNL  Fund of knowledge:  Good  Insight:   Good  Judgment:  Good  Impulse Control: Good   Risk Assessment: Danger to Self:  No Patient denied current suicidal ideation  Self-injurious Behavior: No Danger to Others: No Patient denied current homicidal ideation Duty to Warn:no Physical Aggression / Violence:No  Access to Firearms a concern: No  Gang Involvement:No   Subjective:  Patient stated, "my wife's still not happy with me because I haven't come roaring in the house saying I never want to drive again or work again". Patient stated, "we (patient/wife) had a pretty strong discussion". Patient reported patient's wife is not open to participating in marital counseling. Patient reported he has been attending a local church and reported conflict with patient's wife regarding patient's attendance. Patient stated, "I can't tell any difference" in response to patient's mood. Patient reported he continues to experience difficulty falling asleep. Patient reported a recent appointment with a psychiatrist and reported the psychiatrist prescribed additional medications. Patient stated, "I was in a good mood this morning". Patient stated, "my wife has said we're probably not going to continue these visits". Patient reported his wife is concerned about the co-pay for therapy  sessions. Patient stated, "I still want to drive, that has taken my quality of life down to zero".Patient reported physician was recommended patient not return to work at this time.  Patient stated, "Its always good to talk about things that bother ya".   Interventions: Motivational Interviewing. Clinician conducted session in person at clinician's office at [facility]. Reviewed events since last session and assessed for changes. Discussed current stressors. Clinician utilized motivational interviewing to explore potential goals for therapy. Explored barriers to participation in therapy. Explored patient's thoughts/feelings as it related to participation in therapy.   Diagnosis:  Generalized anxiety disorder   Plan: Goals to be determined during follow up appointment on Sep 16, 2023 due to patient's concerns related to continuing therapy.      Burlene Carpen, LCSW

## 2023-09-08 ENCOUNTER — Ambulatory Visit: Admitting: Psychiatry

## 2023-09-09 ENCOUNTER — Other Ambulatory Visit: Payer: Self-pay

## 2023-09-09 ENCOUNTER — Encounter: Payer: Self-pay | Admitting: Psychiatry

## 2023-09-09 ENCOUNTER — Ambulatory Visit: Admitting: Psychiatry

## 2023-09-09 VITALS — BP 140/50 | HR 64 | Temp 96.6°F | Ht 73.0 in | Wt 260.0 lb

## 2023-09-09 DIAGNOSIS — F411 Generalized anxiety disorder: Secondary | ICD-10-CM

## 2023-09-09 DIAGNOSIS — F331 Major depressive disorder, recurrent, moderate: Secondary | ICD-10-CM

## 2023-09-09 DIAGNOSIS — F5105 Insomnia due to other mental disorder: Secondary | ICD-10-CM

## 2023-09-09 DIAGNOSIS — F99 Mental disorder, not otherwise specified: Secondary | ICD-10-CM | POA: Diagnosis not present

## 2023-09-09 DIAGNOSIS — F4321 Adjustment disorder with depressed mood: Secondary | ICD-10-CM | POA: Diagnosis not present

## 2023-09-09 MED ORDER — TRAZODONE HCL 50 MG PO TABS: 50.0000 mg | ORAL_TABLET | Freq: Every day | ORAL | 0 refills | Status: DC

## 2023-09-09 MED ORDER — ESCITALOPRAM OXALATE 10 MG PO TABS: 10.0000 mg | ORAL_TABLET | Freq: Every day | ORAL | 2 refills | Status: AC

## 2023-09-09 MED ORDER — ESCITALOPRAM OXALATE 10 MG PO TABS: 10.0000 mg | ORAL_TABLET | Freq: Every day | ORAL | 2 refills | Status: DC

## 2023-09-09 NOTE — Progress Notes (Signed)
 BH MD/PA/NP OP Progress Note  09/09/2023 2:05 PM Dennis Wise  MRN:  [id number]  Chief Complaint:  Chief Complaint  Patient presents with   Follow-up   HPI: 73 year old male presenting to [facility] for follow-up.  Patient reports that he is doing much better this month stating that he has come to terms with his limitations as well as his new condition in which she is not able to drive.  Patient reports that he is now thinking of retirement stating that he is looking forward to trying to find a easier home to manage even considering independent living to be able to retire.  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  Patient reports that he is getting involved in a church and stating that he is getting involved with other social activities.  Patient also reports that the medication has been partial and managing symptoms stating that he would like to see more improvements.  Patient still reporting poor sleep saying that he is just watching the clock as the night goes on.  Patient is encouraged to take 10 mg of Lexapro  once daily as well as increasing to 50 mg of trazodone  once a night for sleep.  Patient is in agreement with this treatment plan patient with no other questions or concerns at this time patient will follow up in 1 month.  The wife has also been invited to the next appointment to educate the patient on the patient's current treatment plan.  Visit Diagnosis:    ICD-10-CM   1. Generalized anxiety disorder  F41.1     2. Moderate episode of recurrent major depressive disorder (HCC)  F33.1     3. Insomnia due to other mental disorder  F51.05    F99     4. Grief  F43.21       Past Psychiatric History:  Previous Psych Hospitalizations: Denies   Outpatient treatment: Currently participating in outpatient  therapy in which his wife is not in agreement with.   Medications Current: - Lexapro  10mg  once daily - Trazodone  50 mg once a night   Medication Trials: - No previous med trials Suicide & Violence: - Patient denies SI, HI, AVH.  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  Patient reports the rifle will remain in his health home it will not be relocated.  The patient verbalized understanding that should he have less suicidal thoughts to thoughts or #1 go to emergency department Psychotherapy: Patient currently participates in outpatient therapy, in which that his wife is not in agreement with due to the co-pay as well as in her opinion shall, the wife and husband currently have disagreements regarding care.   Legal: Denies  Past Medical History:  Past Medical History:  Diagnosis Date   Actinic keratosis 10/16/2013   Bilateral carotid artery stenosis 09/23/2021   Less than 50% bilaterally 2023   Combined form of senile cataract of both eyes 05/02/2016   Diabetic retinopathy 03/21/2005   Dupuytren contracture 03/03/2019   Essential hypertension 11/20/1999   Last Assessment & Plan: Change to 5mg  ramipril  and see if the lightheaded troubles get better.  He'll update me as needed.   [name]  Generalized anxiety disorder 10/01/2022   GERD (gastroesophageal reflux disease) 01/1989   Hearing loss 10/20/2014   Helicobacter pylori gastritis 06/11/2011   On EGD 05/2011    Hemorrhagic disorder due to circulating anticoagulants 09/27/2022   History of colonic polyps 06/05/2012   06/05/2011 - 12 polyps max 7 mm, at least 9 adenomas  04/07/2013 - no polyps - repeat colonoscopy 2019 Kenney Peacemaker, [facility], [facility]        IMO SNOMED Dx Update Oct 2024     Hyperlipidemia    Internal hemorrhoids    Iron  deficiency anemia, unspecified 03/04/2011   Ischemic right PCA stroke 09/05/2022   Left  homonymous hemianopsia 08/2022   Caused by right PCA stroke   MGUS (monoclonal gammopathy of unknown significance)    possible dx initially, had negative f/u   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  He agrees.   Shoulder pain 07/02/2022   Syncope 11/22/2021   Thrombocytopenia 05/03/2021   Tibial fracture 10/24/2010   L tibia.  Repair 2012.    L tibia.  Repair 2012.  Last Assessment & Plan:  Per ortho.     Type II diabetes mellitus 04/21/1988   With h/o dec in sensation on feet     Unstable ankle 09/12/2020    Past Surgical History:  Procedure Laterality Date   CATARACT EXTRACTION Bilateral    COLONOSCOPY     ESOPHAGOGASTRODUODENOSCOPY     2012 H. pylori gastritis   FLEXIBLE SIGMOIDOSCOPY  05/1988   internal hemorrhoids   KNEE SURGERY  12/2002   lt knee fx repair ORIF   TIBIA FRACTURE SURGERY     left 2012    Family Psychiatric History: Patient denies family history  Family History:  Family History  Problem Relation Age of Onset   Cancer Mother        ?   Diabetes Mother    Heart failure Mother    Emphysema Father    Cancer Father        ?   Alzheimer's disease Father    Dementia Father    Skin cancer Brother    Diabetes Brother    Colon cancer Neg Hx    Prostate cancer Neg Hx    Esophageal cancer Neg Hx    Rectal cancer Neg Hx    Stomach cancer Neg Hx     Social History:  Social History   Socioeconomic History   Marital status: Married    Spouse name: Not on file   Number of children: 1   Years of education: 12   Highest education level: High school graduate   Occupational History   Occupation: Metallurgist  Tobacco Use   Smoking status: Never   Smokeless tobacco: Never  Vaping Use   Vaping status: Never Used  Substance and Sexual Activity   Alcohol use: Never   Drug use: No   Sexual activity: Yes    Birth control/protection: None  Other Topics Concern   Not on file  Social History Narrative   Prev traveling for sports broadcasting    Working for [employer], [employer], [employer] as of  2019   Married 1970   1 child   [employer]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   Right handed   One floor home   Drinks caffeine   Lives with wife   Social Drivers of Health   Financial Resource Strain: Low Risk  (05/05/2023)   Overall Financial Resource Strain (CARDIA)    Difficulty of Paying Living Expenses: Not hard at all  Food Insecurity: No Food Insecurity (05/05/2023)   Hunger Vital Sign    Worried About Running Out of Food in the Last Year: Never true    Ran Out of Food in the Last Year: Never true  Transportation Needs: No Transportation Needs (05/05/2023)   PRAPARE - Administrator, Civil Service (Medical): No    Lack of Transportation (Non-Medical): No  Physical Activity: Sufficiently Active (05/05/2023)   Exercise Vital Sign    Days of Exercise per Week: 7 days    Minutes of Exercise per Session: 30 min  Recent Concern: Physical Activity - Insufficiently Active (03/11/2023)   Exercise Vital Sign    Days of Exercise per Week: 6 days    Minutes of Exercise per Session: 10 min  Stress: No Stress Concern Present (05/05/2023)   [employer] of Occupational Health - Occupational Stress Questionnaire    Feeling of Stress : Only a little  Social Connections: Moderately Integrated (05/05/2023)   Social Connection and Isolation Panel [NHANES]    Frequency of Communication with Friends and Family: More than three times a week    Frequency of Social Gatherings with Friends and Family: Once a week    Attends Religious Services: More than 4 times per year     Active Member of [employer] or Organizations: No    Attends Banker Meetings: Never    Marital Status: Married  Recent Concern: Social Connections - Moderately Isolated (03/11/2023)   Social Connection and Isolation Panel [NHANES]    Frequency of Communication with Friends and Family: Three times a week    Frequency of Social Gatherings with Friends and Family: Twice a week    Attends Religious Services: Never    Database administrator or Organizations: No    Attends Banker Meetings: Never    Marital Status: Married    Allergies:  Allergies  Allergen Reactions   Hydralazine      Short of breath.     Promethazine Hcl     REACTION: AGITIATION    Metabolic Disorder Labs: Lab Results  Component Value Date   HGBA1C 7.4 (A) 05/29/2023   MPG 177.16 09/07/2022   No results found for: "PROLACTIN" Lab Results  Component Value Date   CHOL 165 02/19/2023   TRIG 77.0 02/19/2023   HDL 97.00 02/19/2023   CHOLHDL 2 02/19/2023   VLDL 15.4 02/19/2023   LDLCALC 53 02/19/2023   LDLCALC 59 09/06/2022   Lab Results  Component Value Date   TSH 3.23 09/01/2022   TSH 3.531 04/03/2021    Therapeutic Level Labs: No results found for: "LITHIUM" No results found for: "VALPROATE" No results found for: "CBMZ"  Current Medications: Current Outpatient Medications  Medication Sig Dispense Refill   acetaminophen  (TYLENOL ) 325 MG tablet Take 2 tablets (650 mg total) by mouth every 4 (four) hours as needed for mild pain (or temp > 37.5 C (99.5 F)). 20 tablet 0   albuterol  (VENTOLIN  HFA) 108 (90 Base) MCG/ACT inhaler Inhale 2 puffs into the lungs every 6 (six) hours as needed for wheezing or shortness of  breath. 8 g 0   atorvastatin  (LIPITOR) 10 MG tablet Take 1 tablet (10 mg total) by mouth at bedtime. 90 tablet 10   clopidogrel  (PLAVIX ) 75 MG tablet Take 1 tablet (75 mg total) by mouth daily. 90 tablet 3   escitalopram  (LEXAPRO ) 5 MG tablet Take 1 tablet (5 mg total) by  mouth daily. 30 tablet 0   glimepiride  (AMARYL ) 2 MG tablet Take 1 tablet (2 mg total) by mouth daily. 90 tablet 10   IRON -VITAMIN C PO Take by mouth daily. 60 mg tablet     losartan  (COZAAR ) 100 MG tablet Take 1 tablet (100 mg total) by mouth daily. 90 tablet 3   memantine  (NAMENDA ) 5 MG tablet Take 1 tablet (5 mg total) by mouth 2 (two) times daily. 60 tablet 11   metFORMIN  (GLUCOPHAGE ) 850 MG tablet Take 1 tablet (850 mg total) by mouth 2 (two) times daily. 180 tablet 10   Multiple Vitamin (MULTIVITAMIN) tablet Take 1 tablet by mouth daily. Centrum Silver     niacin  (NIASPAN ) 500 MG CR tablet Take 3 tablets (1,500 mg total) by mouth daily.     oxyCODONE (OXY IR/ROXICODONE) 5 MG immediate release tablet Take by mouth 2 (two) times daily.     pioglitazone  (ACTOS ) 45 MG tablet Take 1 tablet (45 mg total) by mouth daily. 90 tablet 10   tamsulosin  (FLOMAX ) 0.4 MG CAPS capsule Take 1 capsule (0.4 mg total) by mouth daily. 90 capsule 3   traZODone  (DESYREL ) 50 MG tablet Take 0.5 tablets (25 mg total) by mouth at bedtime. 30 tablet 0   No current facility-administered medications for this visit.     Musculoskeletal: Strength & Muscle Tone: decreased Gait & Station: unsteady Patient leans: Right  Psychiatric Specialty Exam: Review of Systems  Constitutional: Negative.   HENT: Negative.    Eyes: Negative.   Respiratory: Negative.    Cardiovascular: Negative.   Gastrointestinal: Negative.   Endocrine: Negative.   Genitourinary: Negative.   Musculoskeletal: Negative.   Allergic/Immunologic: Negative.   Neurological: Negative.   Hematological: Negative.     Blood pressure (!) 140/50, pulse 64, temperature (!) 96.6 F (35.9 C), temperature source Temporal, height 6\' 1"  (1.854 m), weight 260 lb (117.9 kg).Body mass index is 34.3 kg/m.  General Appearance: Well Groomed  Eye Contact:  Good  Speech:  Clear and Coherent  Volume:  Normal  Mood:  Euthymic  Affect:  Appropriate  Thought  Process:  Coherent  Orientation:  Full (Time, Place, and Person)  Thought Content: Logical   Suicidal Thoughts:  No  Homicidal Thoughts:  No  Memory:  Immediate;   Good Recent;   Good Remote;   Good  Judgement:  Good  Insight:  Good  Psychomotor Activity:  Normal  Concentration:  Concentration: Good and Attention Span: Good  Recall:  Good  Fund of Knowledge: Good  Language: Good  Akathisia:  No  Handed:  Right  AIMS (if indicated):   Assets:  Financial Resources/Insurance Housing Intimacy  ADL's:  Intact  Cognition: WNL  Sleep:  Fair   Screenings: GAD-7    Garment/textile technologist Visit from 05/29/2023 in [facility] Visit from 03/13/2023 in [facility] [place] Office Visit from 02/26/2023 in [facility] Office Visit from 01/01/2023 in [facility] Visit from 09/30/2022 in [facility]  Total GAD-7 Score 4 2  5 5 10       Mini-Mental    Flowsheet Row Office Visit from 03/23/2023 in [facility] Clinical Support from 01/30/2017 in [facility] Clinical Support from 10[facility]22/2015 in [facility]  Total Score (max 30 points ) 28 20 20       PHQ2-9    Flowsheet Row Office Visit from 08/06/2023 in [facility] Office Visit from 05/29/2023 in [facility] at [facility] Clinical Support from 05/05/2023 in [facility] Office Visit from 03/13/2023 in [facility] Office Visit from 02/26/2023 in [facility]  PHQ-2 Total Score 4 0 1 0 1  PHQ-9 Total Score 10 0 1 0 4      Flowsheet Row Office Visit from 08/06/2023 in [facility] ED to  Hosp-Admission (Discharged) from 09/05/2022 in [facility] ED from 11/19/2021 in [facility] Department at [facility]  C-SSRS RISK CATEGORY No Risk No Risk No Risk        Assessment and Plan:  Assessment - Diagnosis: Moderate episode of recurrent major depressive disorder (HCC) [F33.1]  2. Generalized anxiety disorder [F41.1] 3. Insomnia due to other mental disorder [F51.05, F99]  4. Grief [F43.21]   - Progress: Patient reports better improvement in his mood stating that he is now thinking about retirement and thinking of ways how he is going to be more sociable and enjoy his time off from work.  Patient still reluctant about his sudden change from his profession and states that he wishes to be a slower transition but is coming to terms with his new change in lifestyle.  - Risk Factors: Worsening symptoms  Plan - Medications:  Increase taking Lexapro  10mg  po once day for depression and anxiety. Pt educated on the initial nervousness and hedaches, instructed to contact clinic if symptoms last longer than a week.  Increase taking trazodone  50 mg at night sleep.  Patient has been instructed on sedation of this medication instructed to take at night. - Psychotherapy: Patient currently participating in psychotherapy outside of [facility] - Education: Patient has been educated on grief especially and is notes of experiencing grief in relation to loss of his lifestyle due to medical conditions and accident.  Patient was displaying an reminiscent therapy as he was encouraged to talk about his past as well as his experience and knowledge.  She has been instructed her medication purpose side effects and adverse reactions. - Follow-Up: Patient will follow up in 1 month - Referrals: No referrals - Safety Planning: The patient has been educated, if they should have suicidal thoughts with or without a plan to call 911, or go to the closest emergency department.  Pt verbalized  understanding.  Pt endorses having a firearm at home but reports that it is locked up in the state that he will call 911 or go to the emergency department if he has suicidal thoughts with a plan..  Pt also agrees to call the clinic should they have worsening symptoms before the next appointment.    Patient/Guardian was advised Release of Information must be obtained prior to any record release in order to collaborate their care with an outside provider. Patient/Guardian was advised if they have not already done so to contact the registration department to sign all necessary forms in order for  us  to release information regarding their care.   Consent: Patient/Guardian gives verbal consent for treatment and assignment of benefits for services provided during this visit. Patient/Guardian expressed understanding and agreed to proceed.    Arlana Labor, NP 09/09/2023, 2:05 PM

## 2023-09-10 ENCOUNTER — Encounter: Payer: Self-pay | Admitting: Internal Medicine

## 2023-09-15 ENCOUNTER — Telehealth: Payer: Self-pay

## 2023-09-15 NOTE — Telephone Encounter (Signed)
 I left Dennis Wise a voicemail message to please call and set up Dennis Wise an appointment. We now have openings in August.

## 2023-09-15 NOTE — Telephone Encounter (Signed)
-----   Message from [facility] sent at 08/13/2023  4:24 PM EDT ----- Make him a f/u appt with Dr Richardson Chang when they come out. Speak to wife Aden Agreste to set up [phone].

## 2023-09-16 ENCOUNTER — Other Ambulatory Visit: Payer: Self-pay | Admitting: Family Medicine

## 2023-09-16 ENCOUNTER — Ambulatory Visit: Admitting: Clinical

## 2023-09-16 DIAGNOSIS — F411 Generalized anxiety disorder: Secondary | ICD-10-CM

## 2023-09-16 NOTE — Progress Notes (Signed)
 [facility] Counselor/Therapist Progress Note  Patient ID: Dennis Wise, MRN: [id number],    Date: 09/16/2023  Time Spent: 10:41am - 11:31am : 50 minutes  Treatment Type: Individual Therapy  Reported Symptoms: fatigue, difficulty falling and staying asleep  Mental Status Exam: Appearance:  Neat and Well Groomed     Behavior: Appropriate  Motor: Normal  Speech/Language:  Clear and Coherent and Normal Rate  Affect: Appropriate  Mood: normal  Thought process: tangential  Thought content:   Tangential  Sensory/Perceptual disturbances:   WNL  Orientation: oriented to person, place, and situation  Attention: Good  Concentration: Good  Memory: WNL  Fund of knowledge:  Good  Insight:   Fair  Judgment:  Good  Impulse Control: Good   Risk Assessment: Danger to Self:  No Patient denied current suicidal ideation. Patient stated, "I have thought if I were dead my wife would be better off" but denied current suicidal ideation. Self-injurious Behavior: No Danger to Others: No Patient denied current homicidal ideation Duty to Warn:no Physical Aggression / Violence:No  Access to Firearms a concern: No  Gang Involvement:No   Subjective: Patient stated, "I'm happy to get up every day but I'm tired all the time and I hurt". Patient stated, "I haven't giving up on the hopes of driving one day". Patient reported no changes since last session. Patient reported he is not able to pass the field of vision test required for driving. Patient reported feelings of frustration related to not being able to drive. Patient stated, "I can't get out and do stuff like I use to". Patient reported fatigue and reported pain in back and hips.  Patient stated, "I just don't like doing things like we use to do" and reported change in activities is due to physical discomfort/pain. Patient stated, "my mood a lot of days are good", "my wife tells me I'm depressed", "people tell me I'm grieving over my  ability to drive and work". Patient stated, "grieving might be the right word for it" in reference to patient not currently being able to work. Patient stated, "I've kind of embraced the idea of not working". Patient reported loss of motivation due to change in physical limitations. Patient stated, "I don't feel like it's doing me any good", "its not helping me and it's not helping her" in response to participating in therapy.  Patient reported his wife has vocalized concern about patient's financial responsibility for therapy. Patient stated, "we can schedule some more as far as I'm concerned" in reference to participation in therapy.   Interventions: Motivational Interviewing. Clinician conducted session in person at clinician's office at [facility]. Reviewed events since last session and assessed for changes. Discussed patient's thoughts/feelings as it relates to driving and working. Discussed recommendation for therapy and patient's thoughts/feelings in reference to participation in therapy. Discussed barriers to participation in therapy. Provided psycho education related to therapy, expectations as it relates to therapy, and goals. Clinician requested for homework patient complete goal worksheet.    Diagnosis:  Generalized anxiety disorder     Plan: Goals to be determined during follow up appointment on October 13, 2023 due to patient's ambivalence related to participation in therapy.     Burlene Carpen, LCSW

## 2023-09-16 NOTE — Progress Notes (Signed)
   Dennis Barthel, LCSW

## 2023-09-21 ENCOUNTER — Ambulatory Visit: Payer: Medicare HMO | Admitting: Physician Assistant

## 2023-09-21 ENCOUNTER — Encounter: Payer: Self-pay | Admitting: Physician Assistant

## 2023-09-21 VITALS — BP 130/88 | HR 60 | Resp 18

## 2023-09-21 DIAGNOSIS — F0670 Mild neurocognitive disorder due to known physiological condition without behavioral disturbance: Secondary | ICD-10-CM

## 2023-09-21 DIAGNOSIS — I999 Unspecified disorder of circulatory system: Secondary | ICD-10-CM

## 2023-09-21 NOTE — Patient Instructions (Signed)
 Continue Plavix  daily, follow with Cardiology  Continue other medications Continue memantine  5 mg twice a day  Follow up in 6 months

## 2023-09-21 NOTE — Progress Notes (Signed)
 Assessment/Plan:    Mild vascular cognitive impairment   Dennis Wise is a very pleasant 73 y.o. RH male with a history of  DM2, hypertension, hyperlipidemia, MGUS, history of bradycardia ( per wife's report), chronic thrombocytopenia, normocytic anemia, CKD stage III A, history of acute ischemic right PCA stroke with residual left hemianopsia, anemia, presenting today in follow-up for evaluation of memory loss. Patient is on memantine  5 mg twice daily, side effects discussed. Memory is stable. Mood is better controlled, he is attending [facility]. Able to participate on his ADLs to his ability. No longer drives.   Recommendations:   Follow up in 6  months. Continue memantine  5 mg twice daily, side effects discussed Recommend good control of cardiovascular risk factors Continue to control mood as per behavioral health [facility]   History of right PCA ischemic stroke  In review, he presented to the ED with clumsiness, unsteady gait, and 3 episodes of falls preceding the neurological events.  CT of the head at the ED showed hypodensity in the right PCA territory.  CT angio of the head and neck showed complete occlusion of the right PCA and focal atherosclerotic disease in the right greater than left MCA severe stenosis of the M2 branches, occlusion of the small right MCA branch and anterior temporal lobe stenosis, and left M2 and occluded or severely stenosed A1. Neurology was consulted, starting him on dual antiplatelet therapy with Plavix  and aspirin  for 3 months as they felt that this was due to LV disease, and then Plavix  monotherapy.  He also continued atorvastatin  10 mg daily given his LDL is at goal of 59.  Echo showed LVEF between 65 and 70%, with no intracardiac source of embolism.   Continue Plavix  daily Recommended control of cardiovascular risk factors, follow-up with cardiology Continue secondary stroke prevention with  statin  Subjective:   This patient is accompanied in the office by his wife who supplements the history. Previous records as well as any outside records available were reviewed prior to todays visit.   Patient was last seen on 03/23/2023 with MMSE 28/30.    Any changes in memory since last visit? " No changes".  He continues to have some difficulty with remembering recent conversations, new information.  He reports that he has never been good with people's names.  He likes to do crossword puzzles. repeats oneself?  Endorsed Disoriented when walking into a room?  Patient denies    Misplacing objects?  Patient denies   Wandering behavior?   Denies. Any personality changes since last visit? Denies.  He is seeing a psychiatrist and psychotherapist.  Any worsening depression?: denies. "He is less anxious than before"   Hallucinations or paranoia?  Denies.   Seizures?   Denies.    Any sleep changes? Does not sleep well because "I am a worrier". Takes Denies vivid dreams, REM behavior or sleepwalking.  He may talk in his sleep occasionally. Sleep apnea?   Denies.  Any hygiene concerns?   Denies.   Independent of bathing and dressing?  Endorsed  Does the patient needs help with medications? Patient is in charge, wife places and on the pillbox  Who is in charge of the finances?  Wife is in charge     Any changes in appetite?  Denies.     Patient have trouble swallowing?  Denies.   Does the patient cook?  No . Any kitchen accidents such as leaving the stove  on?   Denies.   Any headaches? Intermittently, attributed to vision, daily tylenol , anemia and poor fluid intake.     Vision changes?  Has chronic left hemianopsia since his PCA stroke.  He sees ophthalmology at [facility]. Chronic pain?  He has chronic left shoulder pain due to left rotator cuff tear, follows with orthopedics. Doing PT after recent surgery, "doing better".  Ambulates with difficulty?    Denies.   Recent falls or head injuries?     Denies.      Unilateral weakness, numbness or tingling?  Denies.   Any tremors?  Denies.   Any anosmia?    Denies.   Any incontinence of urine?  Denies.  At night he may have to get up to go to the bathroom because he drinks water right before going to sleep. Any bowel dysfunction?  Denies.      Patient lives with his wife  Does the patient drive?  No longer drives  Neuropsych evaluation 05/07/2023 Briefly, results suggested severe impairment surrounding visuospatial/visuoconstructional abilities. Impairment was also exhibited across processing speed; however, impaired performances were directly and significantly impacted by visual deficits (i.e., slowed visual scanning) and these scores may not reflect his true abilities. Visual memory also represented a relative weakness, largely due to visual deficits impairing his ability to learn novel information efficiently and later recognize this information accurately. There was a clear and very significant impact of his left-sided hemianopsia across cognitive testing. All findings, both visuospatial/perceptual impairment and the presence of his left-sided field cut (i.e., hemianopsia), align perfectly with expectations surrounding his previously experienced right PCA stroke location.   Initial visit 08/2022   How long did patient have memory difficulties? He denies, "but everyone thinks so". Patient has some difficulty remembering recent conversations and people names. He attributes it to decreased hearing and age repeats oneself? Denies  Disoriented when walking into a room?  Patient denies except occasionally not remembering what patient came to the room for   Leaving objects in unusual places? "Everyone has it" Keys in the wallet.   Wandering behavior? denies   Any personality changes ? denies   Any history of depression?: denies   Hallucinations or paranoia?  denies   Seizures? denies    Any sleep changes? " Does not sleep well, "thinks about  different things and things he needs to do, he worries a lot"-wife says. Once in a while he has some  vivid dreams, REM behavior or sleepwalking. He has been talking in sleep for a long time   Sleep apnea? denies   Any hygiene concerns?  denies   Independent of bathing and dressing?  Endorsed  Does the patient need help with medications?  Wife supervises and places them in a pillbox  Who is in charge of the finances?   Wife is in charge.      Any changes in appetite?   denies     Patient have trouble swallowing?  denies   Does the patient cook?  Any kitchen accidents such as leaving the stove on? Patient denies   Any headaches?  denies   Chronic back pain?  denies  Chronic L shoulder due to rotator cuff tear, followed with Ortho.  Ambulates with difficulty? denies   Recent falls or head injuries? 3/24 fell on [facility] [place] while working and head hit a pile of cables.   Vision changes? Depth perception is not as good as it used to be after stroke L<R.  R is better.  Unilateral weakness, numbness or tingling?  denies , no residual after the stroke. Patient  never had a similar episode .Denies any history of  TIA. Denies vertigo dizziness. Denies  history of headaches, dysarthria or dysphagia. No confusion or seizures. Denies any chest pain, or shortness of breath. Denies any fever or chills, or night sweats.  No hormonal supplements.  Denies any recent long distance trips or recent surgeries. No sick contacts. No new stressors present in personal life. Patient is compliant with his medications. Patient is very active, exercising daily. No family history of stroke .  Any tremors?  denies   Any anosmia?  denies   Any incontinence of urine? Gets up frequently at night because he needs water and then he goes again.   Any bowel dysfunction? denies      Patient lives with wife. History of heavy alcohol intake? denies   History of heavy tobacco use? denies   Family history of dementia? Father had dementia  ?type Does patient drive? No, stopped since the stroke due to issues with depth perception.     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    MRI of the brain in 09/05/2022, personally reviewed remarkable for acute right PCA infarct, brain atrophy asymmetrically affecting the anterior right temporal lobe      Past Medical History:  Diagnosis Date   Actinic keratosis 10/16/2013   Bilateral carotid artery stenosis 09/23/2021   Less than 50% bilaterally 2023   Combined form of senile cataract of both eyes 05/02/2016   Diabetic retinopathy 03/21/2005   Dupuytren contracture 03/03/2019   Essential hypertension 11/20/1999   Last Assessment & Plan: Change to 5mg  ramipril  and see if the lightheaded troubles get better.  He'll update me as needed.   [name]    Generalized anxiety disorder 10/01/2022   GERD (gastroesophageal reflux disease) 01/1989   Hearing loss 10/20/2014   Helicobacter pylori gastritis 06/11/2011   On EGD 05/2011    Hemorrhagic disorder due to circulating anticoagulants 09/27/2022   History of colonic polyps 06/05/2012   06/05/2011 - 12 polyps max 7 mm, at least 9 adenomas  04/07/2013 - no polyps - repeat colonoscopy 2019 Kenney Peacemaker, MD, [facility]        IMO SNOMED Dx Update Oct 2024     Hyperlipidemia    Internal hemorrhoids    Iron  deficiency anemia, unspecified 03/04/2011   Ischemic right PCA stroke 09/05/2022   Left homonymous hemianopsia 08/2022   Caused by right PCA stroke   MGUS (monoclonal gammopathy of unknown significance)    possible dx initially, had negative f/u   Mild vascular neurocognitive disorder 05/07/2023   Monoclonal B-cell lymphocytosis 01/15/2022   Nocturia 08/18/2021   Normocytic anemia 04/03/2021   Obesity (BMI 30-39.9) 10/16/2013   Pain in joint of left knee 09/27/2022   Pancytopenia, acquired 05/01/2011   Pneumonia    Pseudophakia, both eyes 07/17/2016   Pure hypercholesterolemia 04/21/1988   Last  Assessment & Plan: Continue statin, continue work on diet, d/w pt about labs.  He agrees.   Shoulder pain 07/02/2022   Syncope 11/22/2021   Thrombocytopenia 05/03/2021   Tibial fracture 10/24/2010   L tibia.  Repair 2012.    L tibia.  Repair 2012.  Last Assessment & Plan:  Per ortho.     Type II diabetes mellitus 04/21/1988   With h/o dec in sensation on feet     Unstable ankle 09/12/2020     Past Surgical History:  Procedure Laterality Date   CATARACT EXTRACTION Bilateral    COLONOSCOPY     ESOPHAGOGASTRODUODENOSCOPY     2012 H. pylori gastritis   FLEXIBLE SIGMOIDOSCOPY  05/1988   internal hemorrhoids   KNEE SURGERY  12/2002   lt knee fx repair ORIF   TIBIA FRACTURE SURGERY     left 2012     PREVIOUS MEDICATIONS:   CURRENT MEDICATIONS:  Outpatient Encounter Medications as of 09/21/2023  Medication Sig   acetaminophen  (TYLENOL ) 325 MG tablet Take 2 tablets (650 mg total) by mouth every 4 (four) hours as needed for mild pain (or temp > 37.5 C (99.5 F)).   albuterol  (VENTOLIN  HFA) 108 (90 Base) MCG/ACT inhaler Inhale 2 puffs into the lungs every 6 (six) hours as needed for wheezing or shortness of breath.   atorvastatin  (LIPITOR) 10 MG tablet Take 1 tablet (10 mg total) by mouth at bedtime.   clopidogrel  (PLAVIX ) 75 MG tablet TAKE 1 TABLET EVERY DAY   escitalopram  (LEXAPRO ) 10 MG tablet Take 1 tablet (10 mg total) by mouth daily.   glimepiride  (AMARYL ) 2 MG tablet Take 1 tablet (2 mg total) by mouth daily.   IRON -VITAMIN C PO Take by mouth daily. 60 mg tablet   losartan  (COZAAR ) 100 MG tablet TAKE 1 TABLET EVERY DAY   memantine  (NAMENDA ) 5 MG tablet Take 1 tablet (5 mg total) by mouth 2 (two) times daily.   metFORMIN  (GLUCOPHAGE ) 850 MG tablet Take 1 tablet (850 mg total) by mouth 2 (two) times daily.   Multiple Vitamin (MULTIVITAMIN) tablet Take 1 tablet by mouth daily. Centrum Silver   niacin  (NIASPAN ) 500 MG CR tablet Take 3 tablets (1,500 mg total) by mouth daily.    oxyCODONE (OXY IR/ROXICODONE) 5 MG immediate release tablet Take by mouth 2 (two) times daily.   pioglitazone  (ACTOS ) 45 MG tablet Take 1 tablet (45 mg total) by mouth daily.   tamsulosin  (FLOMAX ) 0.4 MG CAPS capsule Take 1 capsule (0.4 mg total) by mouth daily.   traZODone  (DESYREL ) 50 MG tablet Take 1 tablet (50 mg total) by mouth at bedtime.   No facility-administered encounter medications on file as of 09/21/2023.     Objective:     PHYSICAL EXAMINATION:    VITALS:   Vitals:   09/21/23 0935  BP: 130/88  Pulse: 60  Resp: 18  SpO2: 98%    GEN:  The patient appears stated age and is in NAD. HEENT:  Normocephalic, atraumatic.   Neurological examination:  General: NAD, well-groomed, appears stated age. Orientation: The patient is alert. Oriented to person, place and not to date. Cranial nerves: There is good facial symmetry.The speech is fluent and clear. No aphasia or dysarthria. Fund of knowledge is appropriate. Recent memory impaired and remote memory is normal.  Attention and concentration are normal.  Able to name objects and repeat phrases.  Hearing is intact to conversational tone .   Sensation: Sensation is intact to light touch throughout Motor: Strength is at least antigravity x4. DTR's 2/4 in UE/LE      09/17/2022    1:00 PM  Montreal Cognitive Assessment   Visuospatial/ Executive (0/5) 1  Naming (0/3) 2  Attention: Read list of digits (0/2) 2  Attention: Read list of letters (0/1) 1  Attention: Serial 7 subtraction starting at 100 (0/3) 2  Language: Repeat phrase (0/2) 1  Language : Fluency (0/1) 1  Abstraction (0/2) 2  Delayed Recall (0/5) 2  Orientation (0/6) 5  Total 19  Adjusted  Score (based on education) 20       03/23/2023   10:00 AM 02/21/2019    2:03 PM 01/30/2017    9:40 AM  MMSE - Mini Mental State Exam  Orientation to time 5 5 5   Orientation to Place 5 5 5   Registration 3 3 3   Attention/ Calculation 4 5 0  Recall 3 3 3   Language- name 2  objects 2  0  Language- repeat 1 1 1   Language- follow 3 step command 3  3  Language- read & follow direction 1  0  Write a sentence 1  0  Copy design 0  0  Total score 28  20       Movement examination: Tone: There is normal tone in the UE/LE Abnormal movements:  no tremor.  No myoclonus.  No asterixis.   Vision: Left hemianopsia, no nystagmus, EOMI Coordination:  There is no decremation with RAM's. Normal finger to nose  Gait and Station: The patient has no difficulty arising out of a deep-seated chair without the use of the hands. The patient's stride length is good.  Gait is cautious and narrow.   Thank you for allowing us  the opportunity to participate in the care of this nice patient. Please do not hesitate to contact us  for any questions or concerns.   Total time spent on today's visit was 24 minutes dedicated to this patient today, preparing to see patient, examining the patient, ordering tests and/or medications and counseling the patient, documenting clinical information in the EHR or other health record, independently interpreting results and communicating results to the patient/family, discussing treatment and goals, answering patient's questions and coordinating care.  Cc:  Donnie Galea, MD  Dennis Wise 09/21/2023 7:17 PM

## 2023-09-25 NOTE — Telephone Encounter (Signed)
 I was able to reach [facility] and set Dennis Wise up an August appointment.

## 2023-09-30 DIAGNOSIS — L570 Actinic keratosis: Secondary | ICD-10-CM | POA: Diagnosis not present

## 2023-09-30 DIAGNOSIS — H61031 Chondritis of right external ear: Secondary | ICD-10-CM | POA: Diagnosis not present

## 2023-09-30 DIAGNOSIS — D0462 Carcinoma in situ of skin of left upper limb, including shoulder: Secondary | ICD-10-CM | POA: Diagnosis not present

## 2023-10-06 ENCOUNTER — Encounter: Payer: Self-pay | Admitting: Psychiatry

## 2023-10-06 ENCOUNTER — Ambulatory Visit (INDEPENDENT_AMBULATORY_CARE_PROVIDER_SITE_OTHER): Admitting: Psychiatry

## 2023-10-06 VITALS — BP 128/76 | HR 57 | Temp 98.0°F | Ht 73.0 in | Wt 253.2 lb

## 2023-10-06 DIAGNOSIS — F330 Major depressive disorder, recurrent, mild: Secondary | ICD-10-CM

## 2023-10-06 DIAGNOSIS — F5105 Insomnia due to other mental disorder: Secondary | ICD-10-CM

## 2023-10-06 DIAGNOSIS — F411 Generalized anxiety disorder: Secondary | ICD-10-CM

## 2023-10-06 DIAGNOSIS — F99 Mental disorder, not otherwise specified: Secondary | ICD-10-CM | POA: Diagnosis not present

## 2023-10-06 DIAGNOSIS — D61818 Other pancytopenia: Secondary | ICD-10-CM | POA: Diagnosis not present

## 2023-10-06 DIAGNOSIS — E11622 Type 2 diabetes mellitus with other skin ulcer: Secondary | ICD-10-CM | POA: Insufficient documentation

## 2023-10-06 DIAGNOSIS — F4321 Adjustment disorder with depressed mood: Secondary | ICD-10-CM

## 2023-10-06 DIAGNOSIS — L98499 Non-pressure chronic ulcer of skin of other sites with unspecified severity: Secondary | ICD-10-CM

## 2023-10-06 NOTE — Progress Notes (Signed)
 [facility] MD/PA/NP OP Progress Note  10/06/2023 11:34 AM Dennis Wise  MRN:  [id number]  Chief Complaint:  Chief Complaint  Patient presents with   Follow-up   HPI: 73 year old male presents [facility] for follow-up.  Patient presents with his wife asking for the participate in treatment planning.  Patient reports he is doing better stating that he is only feeling tired whenever he wakes up and does not is not aware whether or not the medications are working.  The wife states that she believes that the medications are working very well stating that she actually requesting to decrease medications her of Lexapro  to 5 mg once daily and trazodone  to 25 mg as needed only before bed.  Patient states that he is doing much better stating that he is coming terms with his retirement and doing well and developing a relationship with his wife and be able to understand how the needs he needs such as transportation as well as socializing together.  The wife was verbal sharing his accomplishments as well as his sudden departure from his work that is understandable in his challenges of trying to transition to retirement.  Patient recognizes that his wife is putting an effort and states that she is a strong support for him and is currently satisfied with how she is caring for him. Based on this assessment interview is recommended for the patient to continue medications at Lexapro  at 5 mg once daily and trazodone  25 mg only as needed for sleep.  Patient reports that he is satisfied with current medication regimen.  Patient is in agreement with treatment plan patient with no other questions or concerns.  Patient denies SI, HI, AVH.  Patient will follow up in 3 months. Visit Diagnosis:    ICD-10-CM   1. Generalized anxiety disorder  F41.1     2. Mild episode of recurrent major depressive disorder (HCC)  F33.0     3. Insomnia due to other mental disorder  F51.05    F99     4. Grief  F43.21       Past Psychiatric History:   Previous Psych Hospitalizations: Denies   Outpatient treatment: Currently participating in outpatient therapy in which his wife is not in agreement with.   Medications Current: - No current psychotropic meds   Medication Trials: - No previous med trials Suicide & Violence: - Patient denies SI, HI, AVH.  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  Patient reports the rifle will remain in his health home it will not be relocated.  The patient verbalized understanding that should he have less suicidal thoughts to thoughts or #1 go to emergency department Psychotherapy: Patient currently participates in outpatient therapy, in which that his wife is not in agreement with due to the co-pay as well as in her opinion shall, the wife and husband currently have disagreements regarding care.   Legal: Denies  Past Medical History:  Past Medical History:  Diagnosis Date   Actinic keratosis 10/16/2013   Bilateral carotid artery stenosis 09/23/2021   Less than 50% bilaterally 2023   Combined form of senile cataract of both eyes 05/02/2016   Diabetic retinopathy 03/21/2005   Dupuytren contracture 03/03/2019   Essential hypertension 11/20/1999   Last Assessment & Plan: Change to 5mg  ramipril  and see if the lightheaded troubles get better.  He'll update me as needed.   Farmer's  lung    Generalized anxiety disorder 10/01/2022   GERD (gastroesophageal reflux disease) 01/1989   Hearing loss 10/20/2014   Helicobacter pylori gastritis 06/11/2011   On EGD 05/2011    Hemorrhagic disorder due to circulating anticoagulants 09/27/2022   History of colonic polyps 06/05/2012   06/05/2011 - 12 polyps max 7 mm, at least 9 adenomas  04/07/2013 - no polyps - repeat colonoscopy 2019 Kenney Peacemaker, MD, [facility]        IMO SNOMED Dx Update Oct 2024     Hyperlipidemia    Internal hemorrhoids    Iron  deficiency  anemia, unspecified 03/04/2011   Ischemic right PCA stroke 09/05/2022   Left homonymous hemianopsia 08/2022   Caused by right PCA stroke   MGUS (monoclonal gammopathy of unknown significance)    possible dx initially, had negative f/u   Mild vascular neurocognitive disorder 05/07/2023   Monoclonal B-cell lymphocytosis 01/15/2022   NAFLD (nonalcoholic fatty liver disease)    Nocturia 08/18/2021   Normocytic anemia 04/03/2021   Obesity (BMI 30-39.9) 10/16/2013   Pain in joint of left knee 09/27/2022   Pancytopenia, acquired 05/01/2011   Pneumonia    Pseudophakia, both eyes 07/17/2016   Pure hypercholesterolemia 04/21/1988   Last Assessment & Plan: Continue statin, continue work on diet, d/w pt about labs.  He agrees.   Shoulder pain 07/02/2022   Syncope 11/22/2021   Thrombocytopenia 05/03/2021   Tibial fracture 10/24/2010   L tibia.  Repair 2012.    L tibia.  Repair 2012.  Last Assessment & Plan:  Per ortho.     Type II diabetes mellitus 04/21/1988   With h/o dec in sensation on feet     Unstable ankle 09/12/2020    Past Surgical History:  Procedure Laterality Date   CATARACT EXTRACTION Bilateral    COLONOSCOPY     ESOPHAGOGASTRODUODENOSCOPY     2012 H. pylori gastritis   FLEXIBLE SIGMOIDOSCOPY  05/1988   internal hemorrhoids   KNEE SURGERY  12/2002   lt knee fx repair ORIF   TIBIA FRACTURE SURGERY     left 2012    Family Psychiatric History: No additional  Family History:  Family History  Problem Relation Age of Onset   Cancer Mother        ?   Diabetes Mother    Heart failure Mother    Emphysema Father    Cancer Father        ?   Alzheimer's disease Father    Dementia Father    Skin cancer Brother    Diabetes Brother    Colon cancer Neg Hx    Prostate cancer Neg Hx    Esophageal cancer Neg Hx    Rectal cancer Neg Hx    Stomach cancer Neg Hx     Social History:  Social History   Socioeconomic History   Marital status: Married    Spouse name: Not  on file   Number of children: 1   Years of education: 12   Highest education level: High school graduate  Occupational History   Occupation: Metallurgist  Tobacco Use   Smoking status: Never   Smokeless tobacco: Never  Vaping Use   Vaping status: Never Used  Substance and Sexual Activity   Alcohol use: Never   Drug use: No   Sexual activity: Yes    Birth control/protection: None  Other Topics Concern   Not on file  Social History Narrative   Prev traveling for sports broadcasting  Working for [employer], [employer], [employer] as of 2019   Married 1970   1 child   [employer]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   Right handed   One floor home   Drinks caffeine   Lives with wife   Social Drivers of Health   Financial Resource Strain: Low Risk  (05/05/2023)   Overall Financial Resource Strain (CARDIA)    Difficulty of Paying Living Expenses: Not hard at all  Food Insecurity: No Food Insecurity (05/05/2023)   Hunger Vital Sign    Worried About Running Out of Food in the Last Year: Never true    Ran Out of Food in the Last Year: Never true  Transportation Needs: No Transportation Needs (05/05/2023)   PRAPARE - Administrator, Civil Service (Medical): No    Lack of Transportation (Non-Medical): No  Physical Activity: Sufficiently Active (05/05/2023)   Exercise Vital Sign    Days of Exercise per Week: 7 days    Minutes of Exercise per Session: 30 min  Recent Concern: Physical Activity - Insufficiently Active (03/11/2023)   Exercise Vital Sign    Days of Exercise per Week: 6 days    Minutes of Exercise per Session: 10 min  Stress: No Stress Concern Present (05/05/2023)   [employer] of Occupational Health - Occupational Stress Questionnaire    Feeling of Stress : Only a little  Social Connections: Moderately Integrated (05/05/2023)   Social Connection and Isolation Panel    Frequency of Communication with Friends and Family: More than three times a week    Frequency of Social Gatherings  with Friends and Family: Once a week    Attends Religious Services: More than 4 times per year    Active Member of [employer] or Organizations: No    Attends Banker Meetings: Never    Marital Status: Married  Recent Concern: Social Connections - Moderately Isolated (03/11/2023)   Social Connection and Isolation Panel    Frequency of Communication with Friends and Family: Three times a week    Frequency of Social Gatherings with Friends and Family: Twice a week    Attends Religious Services: Never    Database administrator or Organizations: No    Attends Banker Meetings: Never    Marital Status: Married    Allergies:  Allergies  Allergen Reactions   Hydralazine      Short of breath.     Promethazine Hcl     REACTION: AGITIATION    Metabolic Disorder Labs: Lab Results  Component Value Date   HGBA1C 7.4 (A) 05/29/2023   MPG 177.16 09/07/2022   No results found for: PROLACTIN Lab Results  Component Value Date   CHOL 165 02/19/2023   TRIG 77.0 02/19/2023   HDL 97.00 02/19/2023   CHOLHDL 2 02/19/2023   VLDL 15.4 02/19/2023   LDLCALC 53 02/19/2023   LDLCALC 59 09/06/2022   Lab Results  Component Value Date   TSH 3.23 09/01/2022   TSH 3.531 04/03/2021    Therapeutic Level Labs: No results found for: LITHIUM No results found for: VALPROATE No results found for: CBMZ  Current Medications: Current Outpatient Medications  Medication Sig Dispense Refill   acetaminophen  (TYLENOL ) 325 MG tablet Take 2 tablets (650 mg total) by mouth every 4 (four) hours as needed for mild pain (or temp > 37.5 C (99.5 F)). 20 tablet 0   albuterol  (VENTOLIN  HFA) 108 (90 Base) MCG/ACT inhaler Inhale 2 puffs into the lungs every 6 (six) hours as  needed for wheezing or shortness of breath. 8 g 0   atorvastatin  (LIPITOR) 10 MG tablet Take 1 tablet (10 mg total) by mouth at bedtime. 90 tablet 10   clopidogrel  (PLAVIX ) 75 MG tablet TAKE 1 TABLET EVERY DAY 90 tablet 3    escitalopram  (LEXAPRO ) 10 MG tablet Take 1 tablet (10 mg total) by mouth daily. 30 tablet 2   glimepiride  (AMARYL ) 2 MG tablet Take 1 tablet (2 mg total) by mouth daily. 90 tablet 10   IRON -VITAMIN C PO Take by mouth daily. 60 mg tablet     losartan  (COZAAR ) 100 MG tablet TAKE 1 TABLET EVERY DAY 90 tablet 3   memantine  (NAMENDA ) 5 MG tablet Take 1 tablet (5 mg total) by mouth 2 (two) times daily. 60 tablet 11   metFORMIN  (GLUCOPHAGE ) 850 MG tablet Take 1 tablet (850 mg total) by mouth 2 (two) times daily. 180 tablet 10   Multiple Vitamin (MULTIVITAMIN) tablet Take 1 tablet by mouth daily. Centrum Silver     niacin  (NIASPAN ) 500 MG CR tablet Take 3 tablets (1,500 mg total) by mouth daily.     oxyCODONE (OXY IR/ROXICODONE) 5 MG immediate release tablet Take by mouth 2 (two) times daily.     pioglitazone  (ACTOS ) 45 MG tablet Take 1 tablet (45 mg total) by mouth daily. 90 tablet 10   tamsulosin  (FLOMAX ) 0.4 MG CAPS capsule Take 1 capsule (0.4 mg total) by mouth daily. 90 capsule 3   traZODone  (DESYREL ) 50 MG tablet Take 1 tablet (50 mg total) by mouth at bedtime. 30 tablet 0   No current facility-administered medications for this visit.     Musculoskeletal: Strength & Muscle Tone: within normal limits Gait & Station: unsteady Patient leans: Right  Psychiatric Specialty Exam: Review of Systems  Constitutional: Negative.   HENT: Negative.    Eyes: Negative.   Respiratory: Negative.    Cardiovascular: Negative.   Gastrointestinal: Negative.   Endocrine: Negative.   Genitourinary: Negative.   Musculoskeletal: Negative.   Skin: Negative.   Allergic/Immunologic: Negative.   Neurological: Negative.   Hematological: Negative.   Psychiatric/Behavioral: Negative.      Blood pressure 128/76, pulse (!) 57, temperature 98 F (36.7 C), temperature source Temporal, height 6' 1 (1.854 m), weight 253 lb 3.2 oz (114.9 kg), SpO2 99%.Body mass index is 33.41 kg/m.  General Appearance: Well  Groomed  Eye Contact:  Good  Speech:  Clear and Coherent  Volume:  Normal  Mood:  Euthymic  Affect:  Congruent  Thought Process:  Coherent  Orientation:  Full (Time, Place, and Person)  Thought Content: Logical   Suicidal Thoughts:  No  Homicidal Thoughts:  No  Memory:  Immediate;   Good Recent;   Good Remote;   Good  Judgement:  Good  Insight:  Good  Psychomotor Activity:  Normal  Concentration:  Concentration: Good and Attention Span: Good  Recall:  Good  Fund of Knowledge: Good  Language: Good  Akathisia:  No  Handed:  Right  AIMS (if indicated):   Assets:  Desire for Improvement Financial Resources/Insurance Housing  ADL's:  Intact  Cognition: WNL  Sleep:  Good   Screenings: GAD-7    Flowsheet Row Office Visit from 09/09/2023 in [facility] Office Visit from 05/29/2023 in [facility] [place] Office Visit from 03/13/2023 in [facility] [place] Office Visit from 02/26/2023 in [facility] [place] Office Visit from 01/01/2023 in  [facility]  Total GAD-7 Score 10 4 2 5 5    Mini-Mental    Flowsheet Row Office Visit from 03/23/2023 in [facility] Clinical Support from 01/30/2017 in [facility] Clinical Support from 10/22/2015 in [facility]  Total Score (max 30 points ) 28 20 20    [employer]    Flowsheet Row Office Visit from 09/09/2023 in [facility] Office Visit from 08/06/2023 in [facility] Office Visit from 05/29/2023 in [facility] Clinical Support from 05/05/2023 in [facility] Office Visit from 03/13/2023 in [facility]  PHQ-2 Total Score 2 4 0 1 0  PHQ-9  Total Score 5 10 0 1 0   Flowsheet Row Office Visit from 08/06/2023 in [facility] to Hosp-Admission (Discharged) from 09/05/2022 in [facility] from 11/19/2021 in [facility] at [facility]  C-SSRS RISK CATEGORY No Risk No Risk No Risk     Assessment and Plan:  Assessment and Plan:  Assessment - Diagnosis: Moderate episode of recurrent major depressive disorder (HCC) [F33.1]  2. Generalized anxiety disorder [F41.1] 3. Insomnia due to other mental disorder [F51.05, F99]  4. Grief [F43.21]   - Progress: Patient reports better improvement in his mood stating that he is now thinking about retirement and thinking of ways how he is going to be more sociable and enjoy his time off from work.  Patient still reluctant about his sudden change from his profession and states that he wishes to be a slower transition but is coming to terms with his new change in lifestyle.   - Risk Factors: Worsening symptoms  Plan - Medications:  Continue taking Lexapro  5mg  po once day for depression and anxiety. Pt educated on the initial nervousness and hedaches, instructed to contact clinic if symptoms last longer than a week.  Continue taking trazodone  25 mg at night sleep.  Patient has been instructed on sedation of this medication instructed to take at night. - Psychotherapy: Patient currently participating in psychotherapy outside of [facility] - Education: Patient has been educated on grief especially and is notes of experiencing grief in relation to loss of his lifestyle due to medical conditions and accident.  Patient was displaying an reminiscent therapy as he was encouraged to talk about his past as well as his experience and knowledge.  She has been instructed her medication purpose side effects and adverse reactions. - Follow-Up: Patient will follow up in 1 month - Referrals: No referrals - Safety Planning: The patient  has been educated, if they should have suicidal thoughts with or without a plan to call 911, or go to the closest emergency department.  Pt verbalized understanding.  Pt endorses having a firearm at home but reports that it is locked up in the state that he will call 911 or go to the emergency department if he has suicidal thoughts with a plan..  Pt also agrees to call the clinic should they have worsening symptoms before the next appointment.    Patient/Guardian was advised Release of Information must be obtained prior to any record release in order to collaborate their care with an outside provider. Patient/Guardian was advised if they have not already done so to contact the registration department to sign all  necessary forms in order for us  to release information regarding their care.   Consent: Patient/Guardian gives verbal consent for treatment and assignment of benefits for services provided during this visit. Patient/Guardian expressed understanding and agreed to proceed.    Arlana Labor, NP 10/06/2023, 11:34 AM

## 2023-10-13 ENCOUNTER — Ambulatory Visit: Admitting: Clinical

## 2023-10-13 DIAGNOSIS — F411 Generalized anxiety disorder: Secondary | ICD-10-CM | POA: Diagnosis not present

## 2023-10-13 NOTE — Progress Notes (Signed)
 [facility] Counselor/Therapist Progress Note  Patient ID: Dennis Wise, MRN: [id number]    Date: 10/13/23  Time Spent: 10:35  am - 11:28 am : 53 Minutes  Treatment Type: Individual Therapy.  Reported Symptoms: difficulty sleeping, worry, loss of motivation  Mental Status Exam: Appearance:  Neat and Well Groomed     Behavior: Appropriate  Motor: Normal  Speech/Language:  Clear and Coherent and Normal Rate  Affect: Appropriate  Mood: normal  Thought process: tangential  Thought content:   Tangential  Sensory/Perceptual disturbances:   WNL  Orientation: oriented to person, place, time/date, and situation  Attention: Good  Concentration: Good  Memory: WNL  Fund of knowledge:  Good  Insight:   Good  Judgment:  Good  Impulse Control: Good   Risk Assessment: Danger to Self:  No Patient denied current suicidal ideation  Self-injurious Behavior: No Danger to Others: No Patient denied current homicidal ideation Duty to Warn:no Physical Aggression / Violence:No  Access to Firearms a concern: No  Gang Involvement:No   Subjective:  Patient reported patient's wife is not open to participation in marital counseling or providing feedback as it relates to patient's participation in individual therapy. Patient reported feeling wife doesn't have any faith and confidence in my future. Patient reported patient and wife have opposing views on patient's future in regard to patient resuming driving and working. Patient stated, I kind of feel inadequate some days when I see her (wife) working in the yard and stated, It makes me feel bad I'm not doing more. Patient reported fatigue and reported experiencing dizziness recently. Patient stated, I don't feel well. Patient reported patient continues to experience difficulty sleeping. Patient stated, I'm actually in a pretty good mood today.   Interventions: Motivational Interviewing. Clinician conducted session in person at  clinician's office at [facility]. Clinician utilized motivational interviewing to explore potential goals for therapy. Clinician utilized a task centered approach in collaboration with patient to develop goals for therapy. Patient participated in development of goals and agreed to goals for therapy.   Collaboration of Care: Other not required at this time  Diagnosis:  Generalized anxiety disorder   Plan: Patient is to utilize [employer] Therapy, thought re-framing, relaxation techniques, effective communication, behavioral activation, and coping strategies to decrease symptoms associated with their diagnosis. Frequency: bi-weekly  Modality: individual     Long-term goal:   Reduce overall level, frequency, and intensity of the feelings of anxiety as evidenced by decreased worry, difficulty sleeping, and loss of motivation from 7 days/week to 0 to 1 days/week per patient report for at least 3 consecutive months. Target Date: 10/12/24  Progress: established 10/13/23   Short-term goal:  Identify, challenge, and replace negative thought patterns/worry that contribute to feelings of anxiety and low self confidence with positive thoughts, beliefs, and positive self talk per patient's report Target Date: 10/12/24  Progress: established 10/13/23   Reduce symptoms of difficulty sleeping from 7 days per week to 0 to 1 day per week by practicing healthy sleep hygiene Target Date: 10/12/24  Progress: established 10/13/23   Develop effective communication strategies for patient to utilize when expressing patient's thoughts and feelings to others in an assertive way  Target Date: 10/12/24  Progress: established 10/13/23   Increase engagement in positive activities from 1 time per week to 3-4 times per week  Target Date: 10/12/24  Progress: established 10/13/23   patient will decrease daily level of anxiety/worry by developing healthy coping mechanisms Target Date: 10/12/24  Progress:  established 10/13/23                      Dennis Seats, LCSW

## 2023-10-30 DIAGNOSIS — H4311 Vitreous hemorrhage, right eye: Secondary | ICD-10-CM | POA: Diagnosis not present

## 2023-10-30 DIAGNOSIS — H02834 Dermatochalasis of left upper eyelid: Secondary | ICD-10-CM | POA: Diagnosis not present

## 2023-10-30 DIAGNOSIS — H53462 Homonymous bilateral field defects, left side: Secondary | ICD-10-CM | POA: Diagnosis not present

## 2023-10-30 DIAGNOSIS — H02831 Dermatochalasis of right upper eyelid: Secondary | ICD-10-CM | POA: Diagnosis not present

## 2023-10-30 DIAGNOSIS — Z961 Presence of intraocular lens: Secondary | ICD-10-CM | POA: Diagnosis not present

## 2023-10-30 DIAGNOSIS — H35353 Cystoid macular degeneration, bilateral: Secondary | ICD-10-CM | POA: Diagnosis not present

## 2023-10-30 DIAGNOSIS — E113513 Type 2 diabetes mellitus with proliferative diabetic retinopathy with macular edema, bilateral: Secondary | ICD-10-CM | POA: Diagnosis not present

## 2023-10-30 DIAGNOSIS — H40003 Preglaucoma, unspecified, bilateral: Secondary | ICD-10-CM | POA: Diagnosis not present

## 2023-11-09 DIAGNOSIS — G959 Disease of spinal cord, unspecified: Secondary | ICD-10-CM | POA: Diagnosis not present

## 2023-11-09 DIAGNOSIS — M542 Cervicalgia: Secondary | ICD-10-CM | POA: Diagnosis not present

## 2023-11-17 ENCOUNTER — Ambulatory Visit: Admitting: Clinical

## 2023-11-17 DIAGNOSIS — F411 Generalized anxiety disorder: Secondary | ICD-10-CM | POA: Diagnosis not present

## 2023-11-17 NOTE — Progress Notes (Signed)
   Doree Barthel, LCSW

## 2023-11-17 NOTE — Progress Notes (Signed)
 [facility] Counselor/Therapist Progress Note  Patient ID: Dennis Wise, MRN: [id number],    Date: 11/17/2023  Time Spent: 10:38am - 11:30am : 52 minutes   Treatment Type: Individual Therapy  Reported Symptoms: difficulty sleeping, fatigue  Mental Status Exam: Appearance:  Neat and Well Groomed     Behavior: Appropriate  Motor: Normal  Speech/Language:  Clear and Coherent and Normal Rate  Affect: Appropriate  Mood: normal  Thought process: tangential  Thought content:   Tangential  Sensory/Perceptual disturbances:   WNL  Orientation: oriented to person, place, time/date, and situation  Attention: Good  Concentration: Good  Memory: WNL  Fund of knowledge:  Good  Insight:   Fair  Judgment:  Good  Impulse Control: Good   Risk Assessment: Danger to Self:  No Patient denied current suicidal ideation  Self-injurious Behavior: No Danger to Others: No Patient denied current homicidal ideation Duty to Warn:no Physical Aggression / Violence:No  Access to Firearms a concern: No  Gang Involvement:No   Subjective: Patient stated, I'm feeling really lousy today and patient reported feeling he may have a cold. Patient stated, I had a rough night last night and reported coughing and wheezing last night. Patient reported experiencing fatigue when walking to the kitchen and stated, I'm tired and worn out, general malaise. Patient stated, my mood has been good, I think its appropriate for a guy who's life was turned upside down. Patient reported patient wakes up each day and makes breakfast, has coffee with breakfast, completes errands/lunch with brother at times, plays games on patient's tablet, works in [employer], reads various topics on the internet, goes to bed between approximately 7 pm and sundown. Patient reported difficulty falling asleep and reported waking up throughout the night. Patient reported patient uses his computer when waking up at 2 am for  approximately 10-15 minutes and reported difficulty returning to sleep. Patient stated, I wake up in the mornings, I'm tired and reported sleeping in in the mornings. Patient reported at times patient falls back asleep between 7-8 am. Patient stated,  I get to thinking about things in reference to thoughts at night and stated, I have this mental checklist in my head.  Patient stated, what use to put me to sleep was reading. Patient reported drinking tea (caffeine) during the day. Patient stated, I just don think its doing me that much good, I would say no at this point in response to scheduling a follow up appointment for therapy. Patient declined to schedule a follow up appointment and declined to continue therapy.   Interventions: Cognitive Behavioral Therapy. Clinician conducted session in person at clinician's office at [facility]. Reviewed events since last session and assessed for changes. Discussed patient's physical health and fatigue. Explored patient's sleep schedule/routine and barriers to sleep. Provided psycho education related to healthy sleep hygiene. Discussed patient's thoughts/feelings as it relates to participation in therapy and patient's decision to discontinue therapy.    Collaboration of Care: Other not required at this time   Diagnosis:  Generalized anxiety disorder     Plan: Patient is to utilize [employer] Therapy, thought re-framing, relaxation techniques, effective communication, behavioral activation, and coping strategies to decrease symptoms associated with their diagnosis. Frequency: bi-weekly  Modality: individual      Long-term goal:   Reduce overall level, frequency, and intensity of the feelings of anxiety as evidenced by decreased worry, difficulty sleeping, and loss of motivation from 7 days/week to 0 to 1 days/week per patient report for  at least 3 consecutive months. Target Date: 10/12/24  Progress: progressing    Short-term  goal:  Identify, challenge, and replace negative thought patterns/worry that contribute to feelings of anxiety and low self confidence with positive thoughts, beliefs, and positive self talk per patient's report Target Date: 10/12/24  Progress: progressing    Reduce symptoms of difficulty sleeping from 7 days per week to 0 to 1 day per week by practicing healthy sleep hygiene Target Date: 10/12/24  Progress: progressing    Develop effective communication strategies for patient to utilize when expressing patient's thoughts and feelings to others in an assertive way  Target Date: 10/12/24  Progress: progressing    Increase engagement in positive activities from 1 time per week to 3-4 times per week  Target Date: 10/12/24  Progress: progressing    patient will decrease daily level of anxiety/worry by developing healthy coping mechanisms Target Date: 10/12/24  Progress: progressing                                    Darice Seats, LCSW

## 2023-12-02 ENCOUNTER — Telehealth: Payer: Self-pay

## 2023-12-02 ENCOUNTER — Telehealth: Payer: Self-pay | Admitting: Physician Assistant

## 2023-12-02 NOTE — Telephone Encounter (Signed)
 Can send clinical notes to Primary care

## 2023-12-02 NOTE — Telephone Encounter (Signed)
 Reached out to neurology and spoke with Burnard. Message was left requesting input on patient holding Plavix  prior to surgery on 01/05/24. Dr. Cleatus wanted input. Awaiting a return call

## 2023-12-02 NOTE — Telephone Encounter (Signed)
 Caller states Dr.Duncan is needing information regarding pt from primary doctor

## 2023-12-09 ENCOUNTER — Encounter: Payer: Self-pay | Admitting: Internal Medicine

## 2023-12-09 ENCOUNTER — Ambulatory Visit: Admitting: Internal Medicine

## 2023-12-09 VITALS — BP 130/58 | HR 60 | Ht 74.0 in | Wt 261.0 lb

## 2023-12-09 DIAGNOSIS — R748 Abnormal levels of other serum enzymes: Secondary | ICD-10-CM | POA: Diagnosis not present

## 2023-12-09 DIAGNOSIS — D696 Thrombocytopenia, unspecified: Secondary | ICD-10-CM

## 2023-12-09 DIAGNOSIS — K760 Fatty (change of) liver, not elsewhere classified: Secondary | ICD-10-CM | POA: Diagnosis not present

## 2023-12-09 DIAGNOSIS — K31A0 Gastric intestinal metaplasia, unspecified: Secondary | ICD-10-CM | POA: Diagnosis not present

## 2023-12-09 NOTE — Progress Notes (Signed)
 Dennis Wise 73 y.o. [birth year] [id number]  Assessment & Plan:   Encounter Diagnoses  Name Primary?   Metabolic dysfunction-associated fatty liver disease (MAFLD) Yes   Gastric intestinal metaplasia    Elevated alkaline phosphatase level    Thrombocytopenia    He seems stable.  No significant fibrosis on elastography with K PA less than 5 most likely normal.  Alkaline phosphatase level has been elevated no bile duct dilation noted on any imaging along the way.  GGT has been elevated but 5 prime nucleotidase normal.  Will continue to follow.  Cause not entirely clear could be related to his fatty liver disease.  Thrombocytopenia appears to be related to splenomegaly but he does not have cirrhosis or portal hypertension, question if splenomegaly is related to monoclonal lymphocytosis. We discussed possible repeat EGD to do gastric mapping given the intestinal metaplasia.  Will revisit that after his C-spine surgery.  Place a recall for 3 months.  CC: Dennis Arlyss RAMAN, MD     Subjective:   Chief Complaint: Follow-up of fatty liver  HPI  73 year old man with a history of monoclonal B-cell lymphocytosis with splenomegaly and associated thrombocytopenia, metabolic associated fatty liver disease, prior iron  deficiency anemia/chronic disease anemia likely, gastritis and gastric intestinal metaplasia, prior stroke on Plavix .  Last seen in this clinic 02/11/2023.  Repeat ultrasound elastography of the liver with median K PA 3.4 on August 03, 2023.  He does not have any GI specific complaints today.  Getting ready to have C-spine surgery next month.  He had a left rotator cuff surgery and has a repeat tear and may need surgery there as well.    Lab Results  Component Value Date   ALT 22 07/29/2023   AST 20 07/29/2023   GGT 43 06/26/2021   ALKPHOS 175 (H) 07/29/2023   BILITOT 0.7 07/29/2023   08/12/2022 ultrasound hepatic elastography with probable mild fatty infiltration and a  median K PA number 3.8 which was high probability of being normal The studies listed below were all negative/normal also.  Previous hep C and ANA negative.  Hepatitis B surface antigen   Hepatitis B core antibody, total   Alpha-1 antitrypsin phenotype   Hepatitis A antibody, total   Hepatitis B surface antibody,qualitative   Anti-smooth muscle antibody, IgG   Mitochondrial antibodies  EGD May 15, 2022 with antral gastritis changes and reactive gastropathy with some intestinal metaplasia in those biopsies.  Formal mapping has not been done.  No family history of gastric cancer.  Wt Readings from Last 3 Encounters:  12/09/23 261 lb (118.4 kg)  08/05/23 259 lb 6.4 oz (117.7 kg)  05/29/23 259 lb 3.2 oz (117.6 kg)     Allergies  Allergen Reactions   Hydralazine      Short of breath.     Promethazine Hcl     REACTION: AGITIATION   Current Meds  Medication Sig   acetaminophen  (TYLENOL ) 325 MG tablet Take 2 tablets (650 mg total) by mouth every 4 (four) hours as needed for mild pain (or temp > 37.5 C (99.5 F)).   atorvastatin  (LIPITOR) 10 MG tablet Take 1 tablet (10 mg total) by mouth at bedtime.   clopidogrel  (PLAVIX ) 75 MG tablet TAKE 1 TABLET EVERY DAY   escitalopram  (LEXAPRO ) 10 MG tablet Take 1 tablet (10 mg total) by mouth daily.   glimepiride  (AMARYL ) 2 MG tablet Take 1 tablet (2 mg total) by mouth daily.   IRON -VITAMIN C PO Take by mouth daily. 60 mg  tablet   losartan  (COZAAR ) 100 MG tablet TAKE 1 TABLET EVERY DAY   memantine  (NAMENDA ) 5 MG tablet Take 1 tablet (5 mg total) by mouth 2 (two) times daily.   metFORMIN  (GLUCOPHAGE ) 850 MG tablet Take 1 tablet (850 mg total) by mouth 2 (two) times daily.   Multiple Vitamin (MULTIVITAMIN) tablet Take 1 tablet by mouth daily. Centrum Silver   niacin  (NIASPAN ) 500 MG CR tablet Take 3 tablets (1,500 mg total) by mouth daily.   pioglitazone  (ACTOS ) 45 MG tablet Take 1 tablet (45 mg total) by mouth daily.   tamsulosin  (FLOMAX ) 0.4 MG  CAPS capsule Take 1 capsule (0.4 mg total) by mouth daily.   Past Medical History:  Diagnosis Date   Actinic keratosis 10/16/2013   Bilateral carotid artery stenosis 09/23/2021   Less than 50% bilaterally 2023   Cervical stenosis of spinal canal    Combined form of senile cataract of both eyes 05/02/2016   Diabetic retinopathy 03/21/2005   Dupuytren contracture 03/03/2019   Essential hypertension 11/20/1999   Last Assessment & Plan: Change to 5mg  ramipril  and see if the lightheaded troubles get better.  He'll update me as needed.   [name]    Generalized anxiety disorder 10/01/2022   GERD (gastroesophageal reflux disease) 01/1989   Hearing loss 10/20/2014   Helicobacter pylori gastritis 06/11/2011   On EGD 05/2011    Hemorrhagic disorder due to circulating anticoagulants 09/27/2022   History of colonic polyps 06/05/2012   06/05/2011 - 12 polyps max 7 mm, at least 9 adenomas  04/07/2013 - no polyps - repeat colonoscopy 2019 Dennis CHARLENA Commander, [facility], [facility]        IMO SNOMED Dx Update Oct 2024     Hyperlipidemia    Internal hemorrhoids    Iron  deficiency anemia, unspecified 03/04/2011   Ischemic right PCA stroke 09/05/2022   Left homonymous hemianopsia 08/2022   Caused by right PCA stroke   MGUS (monoclonal gammopathy of unknown significance)    possible dx initially, had negative f/u   Mild vascular neurocognitive disorder 05/07/2023   Monoclonal B-cell lymphocytosis 01/15/2022   NAFLD (nonalcoholic fatty liver disease)    Nocturia 08/18/2021   Normocytic anemia 04/03/2021   Obesity (BMI 30-39.9) 10/16/2013   Pain in joint of left knee 09/27/2022   Pancytopenia, acquired 05/01/2011   Pneumonia    Pseudophakia, both eyes 07/17/2016   Pure hypercholesterolemia 04/21/1988   Last Assessment & Plan: Continue statin, continue work on diet, d/w pt about labs.  He agrees.   Shoulder pain 07/02/2022   Syncope 11/22/2021   Thrombocytopenia 05/03/2021   Tibial fracture 10/24/2010   L  tibia.  Repair 2012.    L tibia.  Repair 2012.  Last Assessment & Plan:  Per ortho.     Type II diabetes mellitus 04/21/1988   With h/o dec in sensation on feet     Unstable ankle 09/12/2020   Past Surgical History:  Procedure Laterality Date   CATARACT EXTRACTION Bilateral    COLONOSCOPY     ESOPHAGOGASTRODUODENOSCOPY     2012 H. 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   Right handed   One floor home   Drinks caffeine   Lives with wife   family history includes Alzheimer's disease in his father; Cancer in his father and mother; Dementia in his father; Diabetes in his brother and mother; Emphysema in his father; Heart failure in his mother; Skin cancer in his brother.   Review of Systems As per HPI  Objective:   Physical Exam @BP  (!) 130/58   Pulse 60   Ht 6' 2 (1.88 m)   Wt 261 lb (118.4 kg)   BMI 33.51 kg/m @  General:  NAD Eyes:   anicteric Lungs:  clear Heart::  S1S2 2/6 RUSb SEM  Abdomen:  soft and nontender, BS+, no HSM/mass Ext:   1+ E edema w/ venous stasis changes    Data Reviewed:  See HPI

## 2023-12-09 NOTE — Patient Instructions (Signed)
 You will be due for a recall EGD in 02-2024. We will send you a reminder in the mail when it gets closer to that time.  Please follow up in 1 year.  Thank you for entrusting me with your care and for choosing [facility], Dr. Lupita Commander  _______________________________________________________  If your blood pressure at your visit was 140/90 or greater, please contact your primary care physician to follow up on this.  _______________________________________________________  If you are age 75 or older, your body mass index should be between 23-30. Your Body mass index is 33.51 kg/m. If this is out of the aforementioned range listed, please consider follow up with your Primary Care Provider.  If you are age 8 or younger, your body mass index should be between 19-25. Your Body mass index is 33.51 kg/m. If this is out of the aformentioned range listed, please consider follow up with your Primary Care Provider.   ________________________________________________________  The [facility] providers would like to encourage you to use MYCHART to communicate with providers for non-urgent requests or questions.  Due to long hold times on the telephone, sending your provider a message by [facility] may be a faster and more efficient way to get a response.  Please allow 48 business hours for a response.  Please remember that this is for non-urgent requests.  _______________________________________________________  [facility] is using a team-based approach to care.  Your team is made up of your doctor and two to three APPS. Our APPS (Nurse Practitioners and Physician Assistants) work with your physician to ensure care continuity for you. They are fully qualified to address your health concerns and develop a treatment plan. They communicate directly with your gastroenterologist to care for you. Seeing the Advanced Practice Practitioners on your physician's team can help you by facilitating  care more promptly, often allowing for earlier appointments, access to diagnostic testing, procedures, and other specialty referrals.

## 2023-12-10 ENCOUNTER — Telehealth: Payer: Self-pay | Admitting: Family Medicine

## 2023-12-10 NOTE — Telephone Encounter (Signed)
 Waiting for there office to call back there number is [phone]

## 2023-12-10 NOTE — Telephone Encounter (Signed)
 AV called an orders given as directed by Dr.Jeremy Hill. She thanked me for calling an all questions were answered.

## 2023-12-10 NOTE — Telephone Encounter (Signed)
 Returned call to Evans with [facility] per the recommendation from Dr. Leigh Plavix  should not be held for more than three days. Due to the patient having a stroke back on 08/21/2023. Holding antiplatelets would increase stroke risk.  Form placed back in your tray

## 2023-12-10 NOTE — Telephone Encounter (Signed)
 Office needs to know Neurology input on holding Plavix  x 7 days or 3 days for cervical Posterior fusion. Routing for approval to hold Plavix  and for how long. Thank you. Needs a call back to do for verbal orders at [facility] is the contact [phone].

## 2023-12-10 NOTE — Telephone Encounter (Signed)
 Tried to call again, the office kept me on hold. Finally had to hand up, will try again. IF they call back, please let me know if I am in clinic . Thank you, happy to speak with them.

## 2023-12-10 NOTE — Telephone Encounter (Signed)
 Noted, please send hard copy and scan.  Thanks.

## 2023-12-10 NOTE — Telephone Encounter (Signed)
 Closing encounter. Issue has been addressed in another encounter

## 2023-12-10 NOTE — Telephone Encounter (Signed)
 Reached back out to Neurology and spoke with [facility]. I believe the confusion is in the request. Dr. Cleatus is asking for neurology input on patient holding Plavix  7 days prior to surgery on 01/05/24. Per Bari she will reach out to the neurologist that is on duty today to see about obtaining some input and will follow up with me.

## 2023-12-10 NOTE — Telephone Encounter (Signed)
 Copied from CRM [id number]. Topic: General - Other >> Dec 10, 2023 10:57 AM Chiquita SQUIBB wrote: Reason for CRM: Tully from [facility] is calling AV back, please return her call at [phone]

## 2023-12-11 NOTE — Telephone Encounter (Signed)
 Paperwork has been faxed and sent to the scan center

## 2023-12-17 ENCOUNTER — Telehealth: Payer: Self-pay | Admitting: Physician Assistant

## 2023-12-17 DIAGNOSIS — I071 Rheumatic tricuspid insufficiency: Secondary | ICD-10-CM | POA: Diagnosis not present

## 2023-12-17 DIAGNOSIS — R001 Bradycardia, unspecified: Secondary | ICD-10-CM | POA: Diagnosis not present

## 2023-12-17 DIAGNOSIS — I340 Nonrheumatic mitral (valve) insufficiency: Secondary | ICD-10-CM | POA: Diagnosis not present

## 2023-12-17 DIAGNOSIS — E782 Mixed hyperlipidemia: Secondary | ICD-10-CM | POA: Diagnosis not present

## 2023-12-17 DIAGNOSIS — Z8673 Personal history of transient ischemic attack (TIA), and cerebral infarction without residual deficits: Secondary | ICD-10-CM | POA: Diagnosis not present

## 2023-12-17 DIAGNOSIS — I10 Essential (primary) hypertension: Secondary | ICD-10-CM | POA: Diagnosis not present

## 2023-12-17 DIAGNOSIS — Z01810 Encounter for preprocedural cardiovascular examination: Secondary | ICD-10-CM | POA: Diagnosis not present

## 2023-12-17 NOTE — Telephone Encounter (Signed)
 Evalene called regarding this pt. Said she is trying to get him scheduled but she believes the informaiton in his chart is wrong. It states he had a stroke 08/2023 but he had one 08/2022. Please call her back at [phone] ext [phone]

## 2023-12-17 NOTE — Progress Notes (Signed)
 Established Patient Visit   Chief Complaint: Preop evaluation for neck surgery Chief Complaint  Patient presents with  . Dizziness    Occas    Date of Service: 12/17/2023 Date of Birth: [birth year] PCP: Cleatus Arlyss Gentry, MD  History of Present Illness: Mr. Pfeifer is a 73 y.o.male patient who presented for preop evaluation for neck surgery  Past medical history significant for hypertension, hyperlipidemia, type 2 diabetes, carotid artery stenosis, bradycardia, moderate mitral regurgitation.  Stress test 09/2021 with no ischemia.  Echocardiogram 09/2021 with normal biventricular systolic function, mild to moderate MR, moderate TR. echocardiogram 08/2022 with normal biventricular systolic function with mild MR/TR.  Holter 04/2023 showed sinus rhythm with sinus bradycardia, average heart rate of 54 bpm.  No significant arrhythmias.  Today patient is accompanied by his wife to visit.  Details that he is overall doing well.  Walks with cane due to balance/back pain issues.  Daily walks to his mailbox and back which is about quarter mile, ambulation mainly limited due to back pain/balance issues.  No complaints of chest pain/pressure, shortness of breath, palpitation, dizziness or syncope.  EKG today showed sinus bradycardia with heart rate of 47 bpm  Past Medical and Surgical History  Past Medical History Past Medical History:  Diagnosis Date  . Diabetes (CMS/HHS-HCC)   . History of stroke 08/2022  . Hyperlipidemia   . Hypertension     Past Surgical History He has a past surgical history that includes Knee Surgery left (Left) and arthroscopic rotator cuff repair (Left, 05/21/2023).   Medications and Allergies  Current Medications  Current Outpatient Medications  Medication Sig Dispense Refill  . atorvastatin  (LIPITOR) 10 MG tablet Take 10 mg by mouth once daily.    . clopidogreL  (PLAVIX ) 75 mg tablet Take 75 mg by mouth once daily    . escitalopram  oxalate (LEXAPRO ) 5 MG tablet Take 5 mg by  mouth once daily    . glimepiride  (AMARYL ) 2 MG tablet Take 2 mg by mouth once daily    . losartan  (COZAAR ) 100 MG tablet Take 100 mg by mouth once daily    . memantine  (NAMENDA ) 5 MG tablet Take 5 mg by mouth 2 (two) times daily    . metFORMIN  (GLUCOPHAGE ) 850 MG tablet Take 850 mg by mouth 2 (two) times daily with meals.    . multivitamin with iron  (CENTRUM ADULT COMPLETE) tablet Take 1 tablet by mouth once daily    . niacin  (NIASPAN ) 500 MG ER tablet Take 500 mg by mouth 3 (three) times a day.    . pioglitazone  (ACTOS ) 45 MG tablet Take 45 mg by mouth once daily.    . tamsulosin  (FLOMAX ) 0.4 mg capsule Take 0.4 mg by mouth once daily    . albuterol  MDI, PROVENTIL , VENTOLIN , PROAIR , HFA 90 mcg/actuation inhaler Inhale 2 Inhalations into the lungs every 6 (six) hours as needed (Patient not taking: Reported on 12/17/2023)    . ondansetron  (ZOFRAN -ODT) 4 MG disintegrating tablet PLACE 1 TABLET EVERY 8 HOURS BY TRANSLINGUAL ROUTE AS NEEDED, FOR NAUSEA. (Patient not taking: Reported on 12/17/2023)    . oxyCODONE (ROXICODONE) 5 MG immediate release tablet Take 5 mg by mouth every 4 (four) hours as needed for Pain (Patient not taking: Reported on 12/17/2023)     No current facility-administered medications for this visit.    Allergies: Hydralazine , Other, and Promethazine hcl  Social and Family History  Social History  reports that he has never smoked. He has never used smokeless tobacco.  Family  History Family History  Problem Relation Name Age of Onset  . Lung cancer Mother    . Diabetes Mother    . Lung disease Father      Review of Systems   Review of Systems: The patient denies chest pain, shortness of breath, orthopnea, paroxysmal nocturnal dyspnea, pedal edema, palpitations, heart racing, presyncope, syncope.   Physical Examination   Vitals:BP (!) 150/70 (BP Location: Left upper arm, Patient Position: Sitting, BP Cuff Size: Adult)   Pulse (!) 47   Resp 12   Ht 188 cm (6' 2)    Wt (!) 119 kg (262 lb 6.4 oz)   SpO2 99%   BMI 33.69 kg/m  Ht:188 cm (6' 2) Wt:(!) 119 kg (262 lb 6.4 oz) ADJ:Anib surface area is 2.49 meters squared. Body mass index is 33.69 kg/m.  HEENT: Pupils equally reactive to light and accomodation  Neck: Supple, no significant JVD Lungs: clear to auscultation bilaterally; no wheezes, rales, rhonchi Heart: Regular rate and rhythm. No murmur Extremities: +1 pedal edema  Assessment and Plan   73 y.o. male with  Preop evaluation, cervical spine surgery Mild to moderate mitral/tricuspid regurgitation Chronic sinus bradycardia Hypertension Hyperlipidemia History of stroke  Stable from cardiac standpoint without any anginal symptoms, activity limited due to balance/back pain issues.  Had echocardiogram a year ago which showed normal biventricular systolic function and no significant valvular abnormality.  Stress test 2023 with no ischemia.  Has chronic sinus bradycardia which is unchanged.  Regarding preop risk stratification, he will be at moderate risk for cervical spine surgery.  No further cardiac evaluation is indicated at this time.  Optimized from cardiac standpoint.  Continue current medical therapy.  Can hold Plavix  5 to 7 days prior to surgery  Continue current antihypertensive. Continue statin He is on Plavix  for stroke  Orders Placed This Encounter  Procedures  . ECG 12-lead    Return in about 6 months (around 06/18/2024).  KRISHNA CHAITANYA ALLURI, MD  This dictation was prepared with dragon dictation. Any transcription errors that result from this process are unintentional.

## 2023-12-17 NOTE — Telephone Encounter (Signed)
 Wants to hold Plavix  for 7 days, and confirm, when was actually stroke 2024 or 2025. Needs confirmation. [place]  specialty

## 2023-12-18 NOTE — Telephone Encounter (Signed)
 I called [facility] and orders were given to call back if any questions.

## 2023-12-29 ENCOUNTER — Ambulatory Visit: Admitting: Psychiatry

## 2023-12-29 ENCOUNTER — Other Ambulatory Visit: Payer: Self-pay

## 2023-12-29 VITALS — BP 154/71 | HR 53 | Temp 97.1°F | Ht 74.0 in | Wt 263.6 lb

## 2023-12-29 DIAGNOSIS — F330 Major depressive disorder, recurrent, mild: Secondary | ICD-10-CM | POA: Diagnosis not present

## 2023-12-29 DIAGNOSIS — F411 Generalized anxiety disorder: Secondary | ICD-10-CM

## 2023-12-29 NOTE — Progress Notes (Signed)
 BH MD/PA/NP OP Progress Note  12/29/2023 9:29 AM Dennis Wise  MRN:  [id number]  Chief Complaint:  Chief Complaint  Patient presents with   Follow-up   HPI: 73 year old male presenting to [facility] for follow-up.  Patient reports that he is doing well stating he is enjoying his retired life but states that he does experience stress regarding how his wife has to take care of him and that he is hard of hearing and is not able to perform physically like he used to.  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  Patient was asked about downsizing in which they are interested in but states that they do have cows on the property that do not belong to them that they sold off to friend but states the cows remain causing a big stressor for the family.  When asked about if he can get rid of the correct Couse patient states that he is waiting on his friend to pick up the cows in which he does not seem to be interested in doing.  Patient reports that whenever this happens the friend did drop off a bull that tends to get out of the field and it is responsible of his wife to findable and get him back to the pendant when they try to reach out to the friend he does not respond.  She also reports that he experienced a fall at church in which EMT did respond but states that he lost his balance when his cane came up to his side and caused him to fall off balance while trying to sit down on a chair and fell face forward into the ground.  Patient denies any head injury and reports that he caught himself with his arm but reports that the fall did give him a realization of his physical abilities as his stroke has caused significant physical impairment.  Patient does state that this is a stressor for him that he is not performing like he wants was and states that the physical weakening and his  instability is a concern.  Based on this assessment interview is recommended for the patient continue Lexapro  5 mg once daily and to continue trazodone  25 mg once daily before bed.  Patient with no other questions or concerns.  Patient is in agreement with treatment plan.  Patient denies SI, HI, AVH.  Patient to follow-up in 3 months. Visit Diagnosis:    ICD-10-CM   1. Generalized anxiety disorder  F41.1     2. Mild episode of recurrent major depressive disorder (HCC)  F33.0       Past Psychiatric History:  Previous Psych Hospitalizations: Denies   Outpatient treatment: Currently participating in outpatient therapy in which his wife is not in agreement with.   Medications Current: - Lexapro  5mg  once daily - Trazadone 25mg  once daily Medication Trials: - No previous med trials Suicide & Violence: - Patient denies SI, HI, AVH.  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  Patient reports the rifle will remain in his health home it will not be relocated.  The patient verbalized understanding that should he have less suicidal thoughts to thoughts or #1 go to emergency department Psychotherapy: Patient currently  participates in outpatient therapy, in which that his wife is not in agreement with due to the co-pay as well as in her opinion shall, the wife and husband currently have disagreements regarding care.   Legal: Denies  Past Medical History:  Past Medical History:  Diagnosis Date   Actinic keratosis 10/16/2013   Bilateral carotid artery stenosis 09/23/2021   Less than 50% bilaterally 2023   Cervical stenosis of spinal canal    Combined form of senile cataract of both eyes 05/02/2016   Diabetic retinopathy 03/21/2005   Dupuytren contracture 03/03/2019   Essential hypertension 11/20/1999   Last Assessment & Plan: Change to 5mg  ramipril  and see if the lightheaded troubles get  better.  He'll update me as needed.   [name]    Generalized anxiety disorder 10/01/2022   GERD (gastroesophageal reflux disease) 01/1989   Hearing loss 10/20/2014   Helicobacter pylori gastritis 06/11/2011   On EGD 05/2011    Hemorrhagic disorder due to circulating anticoagulants 09/27/2022   History of colonic polyps 06/05/2012   06/05/2011 - 12 polyps max 7 mm, at least 9 adenomas  04/07/2013 - no polyps - repeat colonoscopy 2019 Dennis CHARLENA Commander, MD, [facility]        IMO SNOMED Dx Update Oct 2024     Hyperlipidemia    Internal hemorrhoids    Iron  deficiency anemia, unspecified 03/04/2011   Ischemic right PCA stroke 09/05/2022   Left homonymous hemianopsia 08/2022   Caused by right PCA stroke   MGUS (monoclonal gammopathy of unknown significance)    possible dx initially, had negative f/u   Mild vascular neurocognitive disorder 05/07/2023   Monoclonal B-cell lymphocytosis 01/15/2022   NAFLD (nonalcoholic fatty liver disease)    Nocturia 08/18/2021   Normocytic anemia 04/03/2021   Obesity (BMI 30-39.9) 10/16/2013   Pain in joint of left knee 09/27/2022   Pancytopenia, acquired 05/01/2011   Pneumonia    Pseudophakia, both eyes 07/17/2016   Pure hypercholesterolemia 04/21/1988   Last Assessment & Plan: Continue statin, continue work on diet, d/w pt about labs.  He agrees.   Shoulder pain 07/02/2022   Syncope 11/22/2021   Thrombocytopenia 05/03/2021   Tibial fracture 10/24/2010   L tibia.  Repair 2012.    L tibia.  Repair 2012.  Last Assessment & Plan:  Per ortho.     Type II diabetes mellitus 04/21/1988   With h/o dec in sensation on feet     Unstable ankle 09/12/2020    Past Surgical History:  Procedure Laterality Date   CATARACT EXTRACTION Bilateral    COLONOSCOPY     ESOPHAGOGASTRODUODENOSCOPY     2012 H. pylori gastritis   FLEXIBLE SIGMOIDOSCOPY  05/1988   internal hemorrhoids   KNEE SURGERY  12/2002   lt knee fx repair ORIF   TIBIA FRACTURE SURGERY     left  2012    Family Psychiatric History: No additional  Family History:  Family History  Problem Relation Age of Onset   Cancer Mother        ?   Diabetes Mother    Heart failure Mother    Emphysema Father    Cancer Father        ?   Alzheimer's disease Father    Dementia Father    Skin cancer Brother    Diabetes Brother    Colon cancer Neg Hx    Prostate cancer Neg Hx    Esophageal cancer Neg Hx    Rectal cancer Neg Hx  Stomach cancer Neg Hx     Social History:  Social History   Socioeconomic History   Marital status: Married    Spouse name: [name]   Number of children: 1   Years of education: 12   Highest education level: High school graduate  Occupational History   Occupation: Audio engineer-retired  Tobacco Use   Smoking status: Never   Smokeless tobacco: Never  Vaping Use   Vaping status: Never Used  Substance and Sexual Activity   Alcohol use: Never   Drug use: No   Sexual activity: Yes    Birth control/protection: None  Other Topics Concern   Not on file  Social History Narrative   Prev traveling for sports broadcasting    Working for [employer], [employer] etc as of 2019   Married 1970   1 child   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   Right handed   One floor home   Drinks caffeine   Lives with wife   Social Drivers of Health   Financial Resource Strain: Low Risk  (05/05/2023)   Overall Financial Resource Strain (CARDIA)    Difficulty of Paying Living Expenses: Not hard at all  Food Insecurity: No Food Insecurity (05/05/2023)   Hunger Vital Sign    Worried About Running Out of Food in the Last Year: Never true    Ran Out of Food in the Last Year: Never true  Transportation Needs: No Transportation Needs (05/05/2023)   PRAPARE - Administrator, Civil Service (Medical): No    Lack of Transportation (Non-Medical): No  Physical Activity: Sufficiently Active (05/05/2023)   Exercise Vital Sign    Days of Exercise per Week: 7 days    Minutes of Exercise  per Session: 30 min  Recent Concern: Physical Activity - Insufficiently Active (03/11/2023)   Exercise Vital Sign    Days of Exercise per Week: 6 days    Minutes of Exercise per Session: 10 min  Stress: No Stress Concern Present (05/05/2023)   [employer] of Occupational Health - Occupational Stress Questionnaire    Feeling of Stress : Only a little  Social Connections: Moderately Integrated (05/05/2023)   Social Connection and Isolation Panel    Frequency of Communication with Friends and Family: More than three times a week    Frequency of Social Gatherings with Friends and Family: Once a week    Attends Religious Services: More than 4 times per year    Active Member of [employer] or Organizations: No    Attends Banker Meetings: Never    Marital Status: Married  Recent Concern: Social Connections - Moderately Isolated (03/11/2023)   Social Connection and Isolation Panel    Frequency of Communication with Friends and Family: Three times a week    Frequency of Social Gatherings with Friends and Family: Twice a week    Attends Religious Services: Never    Database administrator or Organizations: No    Attends Banker Meetings: Never    Marital Status: Married    Allergies:  Allergies  Allergen Reactions   Hydralazine      Short of breath.     Promethazine Hcl     REACTION: AGITIATION    Metabolic Disorder Labs: Lab Results  Component Value Date   HGBA1C 7.4 (A) 05/29/2023   MPG 177.16 09/07/2022   No results found for: PROLACTIN Lab Results  Component Value Date   CHOL 165 02/19/2023   TRIG 77.0 02/19/2023   HDL  97.00 02/19/2023   CHOLHDL 2 02/19/2023   VLDL 15.4 02/19/2023   LDLCALC 53 02/19/2023   LDLCALC 59 09/06/2022   Lab Results  Component Value Date   TSH 3.23 09/01/2022   TSH 3.531 04/03/2021    Therapeutic Level Labs: No results found for: LITHIUM No results found for: VALPROATE No results found for: CBMZ  Current  Medications: Current Outpatient Medications  Medication Sig Dispense Refill   acetaminophen  (TYLENOL ) 325 MG tablet Take 2 tablets (650 mg total) by mouth every 4 (four) hours as needed for mild pain (or temp > 37.5 C (99.5 F)). 20 tablet 0   atorvastatin  (LIPITOR) 10 MG tablet Take 1 tablet (10 mg total) by mouth at bedtime. 90 tablet 10   clopidogrel  (PLAVIX ) 75 MG tablet TAKE 1 TABLET EVERY DAY 90 tablet 3   escitalopram  (LEXAPRO ) 10 MG tablet Take 1 tablet (10 mg total) by mouth daily. 30 tablet 2   glimepiride  (AMARYL ) 2 MG tablet Take 1 tablet (2 mg total) by mouth daily. 90 tablet 10   IRON -VITAMIN C PO Take by mouth daily. 60 mg tablet     losartan  (COZAAR ) 100 MG tablet TAKE 1 TABLET EVERY DAY 90 tablet 3   memantine  (NAMENDA ) 5 MG tablet Take 1 tablet (5 mg total) by mouth 2 (two) times daily. 60 tablet 11   metFORMIN  (GLUCOPHAGE ) 850 MG tablet Take 1 tablet (850 mg total) by mouth 2 (two) times daily. 180 tablet 10   Multiple Vitamin (MULTIVITAMIN) tablet Take 1 tablet by mouth daily. Centrum Silver     niacin  (NIASPAN ) 500 MG CR tablet Take 3 tablets (1,500 mg total) by mouth daily.     pioglitazone  (ACTOS ) 45 MG tablet Take 1 tablet (45 mg total) by mouth daily. 90 tablet 10   tamsulosin  (FLOMAX ) 0.4 MG CAPS capsule Take 1 capsule (0.4 mg total) by mouth daily. 90 capsule 3   No current facility-administered medications for this visit.     Musculoskeletal: Strength & Muscle Tone: within normal limits Gait & Station: unsteady Patient leans: Right   Psychiatric Specialty Exam: Review of Systems  Constitutional: Negative.   HENT: Negative.    Eyes: Negative.   Respiratory: Negative.    Cardiovascular: Negative.   Gastrointestinal: Negative.   Endocrine: Negative.   Genitourinary: Negative.   Musculoskeletal: Negative.   Skin: Negative.   Allergic/Immunologic: Negative.   Neurological: Negative.   Hematological: Negative.   Psychiatric/Behavioral: Negative.       Blood pressure (!) 154/71, pulse (!) 53, temperature (!) 97.1 F (36.2 C), temperature source Temporal, height 6' 2 (1.88 m), weight 263 lb 9.6 oz (119.6 kg).Body mass index is 33.84 kg/m.  General Appearance: Well Groomed  Eye Contact:  Good  Speech:  Clear and Coherent  Volume:  Normal  Mood:  Anxious and Depressed  Affect:  Appropriate  Thought Process:  Coherent  Orientation:  Full (Time, Place, and Person)  Thought Content: Logical   Suicidal Thoughts:  No  Homicidal Thoughts:  No  Memory:  Immediate;   Good Recent;   Good Remote;   Good  Judgement:  Good  Insight:  Good  Psychomotor Activity:  Normal  Concentration:  Concentration: Good and Attention Span: Good  Recall:  Good  Fund of Knowledge: Good  Language: Good  Akathisia:  No  Handed:  Right  AIMS (if indicated): not done  Assets:  Desire for Improvement Financial Resources/Insurance Housing  ADL's:  Intact  Cognition: WNL  Sleep:  Fair  Screenings: GAD-7    Flowsheet Row Office Visit from 10/06/2023 in [facility] Office Visit from 09/09/2023 in [facility] Office Visit from 05/29/2023 in [facility] Office Visit from 03/13/2023 in [facility] Office Visit from 02/26/2023 in [facility]  Total GAD-7 Score 2 10 4 2 5    Mini-Mental    Flowsheet Row [facility] Clinical Support from 01/30/2017 in [facility] Clinical Support from 10/22/2015 in [facility]  Total Score (max 30 points ) 28 20 20    PHQ2-9    Flowsheet Row Office Visit from 10/06/2023 in [facility] Office Visit from 09/09/2023 in [facility] Office Visit from 08/06/2023  in [facility] Office Visit from 05/29/2023 in [facility] Clinical Support from 05/05/2023 in [facility] at [facility]  PHQ-2 Total Score 0 2 4 0 1  PHQ-9 Total Score -- 5 10 0 1   Flowsheet Row Office Visit from 08/06/2023 in [facility] ED to Hosp-Admission (Discharged) from 09/05/2022 in [facility] from 11/19/2021 in [facility] at [facility]  C-SSRS RISK CATEGORY No Risk No Risk No Risk     Assessment and Plan:  Assessment - Diagnosis: Moderate episode of recurrent major depressive disorder (HCC) [F33.1]  2. Generalized anxiety disorder [F41.1]   - Progress: Patient reports better improvement in his mood stating that he is now thinking about retirement and thinking of ways how he is going to be more sociable and enjoy his time off from work.  Patient still reluctant about his sudden change from his profession and states that he wishes to be a slower transition but is coming to terms with his new change in lifestyle.   - Risk Factors: Worsening symptoms  Plan - Medications:  Continue taking Lexapro  5mg  po once day for depression and anxiety. Pt educated on the initial nervousness and hedaches, instructed to contact clinic if symptoms last longer than a week.  Continue taking trazodone  25 mg at night sleep.  Patient has been instructed on sedation of this medication instructed to take at night. - Psychotherapy: Patient currently participating in psychotherapy outside of [facility] - Education: Patient has been educated on grief especially and is notes of experiencing grief in relation to loss of his lifestyle due to medical conditions and accident.  Patient was displaying an reminiscent therapy as he was encouraged to talk about his past as well as his experience and knowledge.  She has been instructed her  medication purpose side effects and adverse reactions. - Follow-Up: Patient will follow up in 1 month - Referrals: No referrals - Safety Planning: The patient has been educated, if they should have suicidal thoughts with or without a plan to call 911, or go to the closest emergency department.  Pt verbalized understanding.  Pt endorses having a firearm at home but reports that it is locked up in the state that he will call 911 or go to the emergency department if he has suicidal thoughts with a plan..  Pt also agrees to call the clinic should they have worsening symptoms before the next appointment.  Patient/Guardian was advised Release  of Information must be obtained prior to any record release in order to collaborate their care with an outside provider. Patient/Guardian was advised if they have not already done so to contact the registration department to sign all necessary forms in order for us  to release information regarding their care.   Consent: Patient/Guardian gives verbal consent for treatment and assignment of benefits for services provided during this visit. Patient/Guardian expressed understanding and agreed to proceed.    Dorn Jama Der, NP 12/29/2023, 9:29 AM

## 2024-01-05 ENCOUNTER — Ambulatory Visit: Admitting: Psychiatry

## 2024-01-05 DIAGNOSIS — G992 Myelopathy in diseases classified elsewhere: Secondary | ICD-10-CM | POA: Diagnosis not present

## 2024-01-05 DIAGNOSIS — R410 Disorientation, unspecified: Secondary | ICD-10-CM | POA: Diagnosis not present

## 2024-01-05 DIAGNOSIS — M5002 Cervical disc disorder with myelopathy, mid-cervical region, unspecified level: Secondary | ICD-10-CM | POA: Diagnosis not present

## 2024-01-05 DIAGNOSIS — G9589 Other specified diseases of spinal cord: Secondary | ICD-10-CM | POA: Diagnosis not present

## 2024-01-05 DIAGNOSIS — M4802 Spinal stenosis, cervical region: Secondary | ICD-10-CM | POA: Diagnosis not present

## 2024-01-05 DIAGNOSIS — M50123 Cervical disc disorder at C6-C7 level with radiculopathy: Secondary | ICD-10-CM | POA: Diagnosis not present

## 2024-01-05 DIAGNOSIS — M5001 Cervical disc disorder with myelopathy,  high cervical region: Secondary | ICD-10-CM | POA: Diagnosis not present

## 2024-01-05 DIAGNOSIS — D472 Monoclonal gammopathy: Secondary | ICD-10-CM | POA: Diagnosis not present

## 2024-01-05 DIAGNOSIS — N189 Chronic kidney disease, unspecified: Secondary | ICD-10-CM | POA: Diagnosis not present

## 2024-01-05 DIAGNOSIS — M5412 Radiculopathy, cervical region: Secondary | ICD-10-CM | POA: Diagnosis not present

## 2024-01-05 DIAGNOSIS — N400 Enlarged prostate without lower urinary tract symptoms: Secondary | ICD-10-CM | POA: Diagnosis not present

## 2024-01-05 DIAGNOSIS — E785 Hyperlipidemia, unspecified: Secondary | ICD-10-CM | POA: Diagnosis not present

## 2024-01-05 DIAGNOSIS — M50122 Cervical disc disorder at C5-C6 level with radiculopathy: Secondary | ICD-10-CM | POA: Diagnosis not present

## 2024-01-05 DIAGNOSIS — M5003 Cervical disc disorder with myelopathy, cervicothoracic region: Secondary | ICD-10-CM | POA: Diagnosis not present

## 2024-01-05 DIAGNOSIS — I10 Essential (primary) hypertension: Secondary | ICD-10-CM | POA: Diagnosis not present

## 2024-01-05 DIAGNOSIS — G959 Disease of spinal cord, unspecified: Secondary | ICD-10-CM | POA: Diagnosis not present

## 2024-01-05 DIAGNOSIS — D693 Immune thrombocytopenic purpura: Secondary | ICD-10-CM | POA: Diagnosis not present

## 2024-01-05 DIAGNOSIS — M50121 Cervical disc disorder at C4-C5 level with radiculopathy: Secondary | ICD-10-CM | POA: Diagnosis not present

## 2024-01-05 DIAGNOSIS — D638 Anemia in other chronic diseases classified elsewhere: Secondary | ICD-10-CM | POA: Diagnosis not present

## 2024-01-05 DIAGNOSIS — E119 Type 2 diabetes mellitus without complications: Secondary | ICD-10-CM | POA: Diagnosis not present

## 2024-01-05 HISTORY — PX: CERVICAL FUSION: SHX112

## 2024-01-11 NOTE — Progress Notes (Signed)
 Dennis Wise                                          MRN: [id number]   01/11/2024   The [facility] Quality Team Specialist reviewed this patient medical record for the purposes of chart review for care gap closure. The following were reviewed: chart review for care gap closure-kidney health evaluation for diabetes:eGFR  and uACR.    [facility]I Quality Team

## 2024-01-14 DIAGNOSIS — E785 Hyperlipidemia, unspecified: Secondary | ICD-10-CM | POA: Diagnosis not present

## 2024-01-14 DIAGNOSIS — D649 Anemia, unspecified: Secondary | ICD-10-CM | POA: Diagnosis not present

## 2024-01-14 DIAGNOSIS — E119 Type 2 diabetes mellitus without complications: Secondary | ICD-10-CM | POA: Diagnosis not present

## 2024-01-14 DIAGNOSIS — M4802 Spinal stenosis, cervical region: Secondary | ICD-10-CM | POA: Diagnosis not present

## 2024-01-14 DIAGNOSIS — Z981 Arthrodesis status: Secondary | ICD-10-CM | POA: Diagnosis not present

## 2024-01-14 DIAGNOSIS — K219 Gastro-esophageal reflux disease without esophagitis: Secondary | ICD-10-CM | POA: Diagnosis not present

## 2024-01-14 DIAGNOSIS — I10 Essential (primary) hypertension: Secondary | ICD-10-CM | POA: Diagnosis not present

## 2024-01-14 DIAGNOSIS — D693 Immune thrombocytopenic purpura: Secondary | ICD-10-CM | POA: Diagnosis not present

## 2024-01-14 DIAGNOSIS — Z4789 Encounter for other orthopedic aftercare: Secondary | ICD-10-CM | POA: Diagnosis not present

## 2024-01-19 DIAGNOSIS — D649 Anemia, unspecified: Secondary | ICD-10-CM | POA: Diagnosis not present

## 2024-01-21 ENCOUNTER — Ambulatory Visit: Admitting: Family Medicine

## 2024-01-21 ENCOUNTER — Ambulatory Visit: Payer: Self-pay

## 2024-01-21 VITALS — BP 200/64 | HR 55 | Temp 98.4°F | Ht 74.0 in | Wt 256.2 lb

## 2024-01-21 DIAGNOSIS — D649 Anemia, unspecified: Secondary | ICD-10-CM | POA: Diagnosis not present

## 2024-01-21 DIAGNOSIS — E119 Type 2 diabetes mellitus without complications: Secondary | ICD-10-CM | POA: Diagnosis not present

## 2024-01-21 DIAGNOSIS — D693 Immune thrombocytopenic purpura: Secondary | ICD-10-CM | POA: Diagnosis not present

## 2024-01-21 DIAGNOSIS — Z981 Arthrodesis status: Secondary | ICD-10-CM | POA: Diagnosis not present

## 2024-01-21 DIAGNOSIS — K219 Gastro-esophageal reflux disease without esophagitis: Secondary | ICD-10-CM | POA: Diagnosis not present

## 2024-01-21 DIAGNOSIS — M4802 Spinal stenosis, cervical region: Secondary | ICD-10-CM | POA: Diagnosis not present

## 2024-01-21 DIAGNOSIS — G8918 Other acute postprocedural pain: Secondary | ICD-10-CM | POA: Diagnosis not present

## 2024-01-21 DIAGNOSIS — I10 Essential (primary) hypertension: Secondary | ICD-10-CM

## 2024-01-21 DIAGNOSIS — E785 Hyperlipidemia, unspecified: Secondary | ICD-10-CM | POA: Diagnosis not present

## 2024-01-21 DIAGNOSIS — Z4789 Encounter for other orthopedic aftercare: Secondary | ICD-10-CM | POA: Diagnosis not present

## 2024-01-21 MED ORDER — HYDROCHLOROTHIAZIDE 25 MG PO TABS: 25.0000 mg | ORAL_TABLET | Freq: Every day | ORAL | 11 refills | Status: DC

## 2024-01-21 NOTE — Telephone Encounter (Signed)
 FYI Only or Action Required?: FYI only for provider.  Patient was last seen in primary care on 05/29/2023 by Dennis Arlyss RAMAN, MD.  Called Nurse Triage reporting Hypertension.  Symptoms began today.  Interventions attempted: Prescription medications: HH clinician reports pt compliant with HTN meds and did not miss any doses.  Symptoms are: unchanged.  Triage Disposition: See HCP Within 4 Hours (Or PCP Triage)  Patient/caregiver understands and will follow disposition?: Yes            Copied from CRM [id number]. Topic: Clinical - Red Word Triage >> Jan 21, 2024 11:52 AM Dennis Wise wrote: Red Word that prompted transfer to Nurse Triage: Dennis Wise calling from [facility] states that the patients blood pressure is very high while resting between 190-200/70. Patient has no other symptoms of high blood pressure, they are compliant with all medications and their blood pressure is still very high. Reason for Disposition  [1] Systolic BP >= 200 OR Diastolic >= 120 AND [2] having NO cardiac or neurologic symptoms    Triager advised that pt needed to be evaluated in office since asymptomatic and strongly recommended [facility] clinician to call EMS/ED if pt becomes symptomatic.  Answer Assessment - Initial Assessment Questions 1. BLOOD PRESSURE: What is your blood pressure? Did you take at least two measurements 5 minutes apart?     1147: 200/70 L arm 1150: 194/70 R arm 2. ONSET: When did you take your blood pressure?     Today  3. HOW: How did you take your blood pressure? (e.g., automatic home BP monitor, visiting nurse)     Visiting [facility] clinician using manual 4. HISTORY: Do you have a history of high blood pressure?     yes 5. MEDICINES: Are you taking any medicines for blood pressure? Have you missed any doses recently?     Clinician reports pt is compliant with HTN meds and did not miss any 6. OTHER SYMPTOMS: Do you have any symptoms? (e.g., blurred vision, chest  pain, difficulty breathing, headache, weakness)     denies 7. PREGNANCY: Is there any chance you are pregnant? When was your last menstrual period?     N/a  Protocols used: Blood Pressure - High-A-AH

## 2024-01-21 NOTE — Assessment & Plan Note (Signed)
 Inadequate control, trying to wean down oxycodone and use Tylenol  for pain but this is not controlling pain and is likely contributing to frustration and blood pressure elevation. Recommended patient to contact surgeon regarding intermediate medication such as hydrocodone versus addition of medication such as muscle relaxants or Valium to help with muscle spasm and pain control.  They will recontact us  here at primary care if they are unable to reach the surgeon regarding postoperative pain. Return and ER precautions provided.

## 2024-01-21 NOTE — Progress Notes (Signed)
 Patient ID: Dennis Wise, male    DOB: Feb 15, 1951, 73 y.o.   MRN: [id number]  This visit was conducted in person.  BP (!) 200/64   Pulse (!) 55   Temp 98.4 F (36.9 C) (Temporal)   Ht 6' 2 (1.88 m)   Wt 256 lb 4 oz (116.2 kg)   SpO2 96%   BMI 32.90 kg/m    CC:  Chief Complaint  Patient presents with   Hypertension    S/P Neck Surgery PT came today and blood pressure was high 200/77 and 194/77    Subjective:   HPI: Dennis Wise is a 73 y.o. male patient of Dr. Elfredia with history of hypertension, high cholesterol, obesity BMI 32, bilateral carotid stenosis and diabetes presenting on 01/21/2024 for Hypertension (S/P Neck Surgery/PT came today and blood pressure was high/200/77 and 194/77)  Recent neck surgery on 01/05/2024 at [facility] in [place].  More complications than expected after surgery, stayed in hospital 8 days.  Now able to eat and walk.  At  [facility] PT today noted blood pressure was high... 194-200/77. Later today 154-174/57-65 HR 48-55  He is having a significant amount of pain 8/10 on pain scale.  Using oxycodone .SABRA Trying to wean off but having issue. Using tylenol  otherwoise for pain.  Hypertension previously maintained with losartan  100 mg p.o. daily. Associated symptoms: does have some chest heaviness ever since the surgery.  No chest pain, or SOB but cannot get a deep breath.SABRA as neck brace on.  No new swelling  in ankles.  Under stress and frustrated with where he is in recovery of surgery. Also having chest wall pain and arm soreness given immobility of neck he has to pull himself out of bed using his right iron  bed frame. He has 5 days left of oxycodone  BP Readings from Last 3 Encounters:  01/21/24 (!) 200/64  12/09/23 (!) 130/58  09/21/23 130/88       Cardiology Dr. Wilburn Echocardiogram 09/2021 with normal biventricular systolic function, mild to moderate MR, moderate TR. echocardiogram 08/2022 with normal biventricular  systolic function with mild MR/TR. Holter 04/2023 showed sinus rhythm with sinus bradycardia, average heart rate of 54 bpm. No significant arrhythmias.   Chronic bradycardia.   No low potassium or gout history.  Relevant past medical, surgical, family and social history reviewed and updated as indicated. Interim medical history since our last visit reviewed. Allergies and medications reviewed and updated. Outpatient Medications Prior to Visit  Medication Sig Dispense Refill   acetaminophen  (TYLENOL ) 325 MG tablet Take 2 tablets (650 mg total) by mouth every 4 (four) hours as needed for mild pain (or temp > 37.5 C (99.5 F)). 20 tablet 0   atorvastatin  (LIPITOR) 10 MG tablet Take 1 tablet (10 mg total) by mouth at bedtime. 90 tablet 10   clopidogrel  (PLAVIX ) 75 MG tablet TAKE 1 TABLET EVERY DAY 90 tablet 3   COLACE 100 MG capsule Take 100 mg by mouth daily.     escitalopram  (LEXAPRO ) 10 MG tablet Take 1 tablet (10 mg total) by mouth daily. 30 tablet 2   glimepiride  (AMARYL ) 2 MG tablet Take 1 tablet (2 mg total) by mouth daily. 90 tablet 10   IRON -VITAMIN C PO Take by mouth daily. 60 mg tablet     losartan  (COZAAR ) 100 MG tablet TAKE 1 TABLET EVERY DAY 90 tablet 3   memantine  (NAMENDA ) 5 MG tablet Take 1 tablet (5 mg total) by mouth  2 (two) times daily. 60 tablet 11   metFORMIN  (GLUCOPHAGE ) 850 MG tablet Take 1 tablet (850 mg total) by mouth 2 (two) times daily. 180 tablet 10   Multiple Vitamin (MULTIVITAMIN) tablet Take 1 tablet by mouth daily. Centrum Silver     niacin  (NIASPAN ) 500 MG CR tablet Take 3 tablets (1,500 mg total) by mouth daily.     ondansetron  (ZOFRAN -ODT) 4 MG disintegrating tablet Take 4 mg by mouth every 8 (eight) hours as needed.     pioglitazone  (ACTOS ) 45 MG tablet Take 1 tablet (45 mg total) by mouth daily. 90 tablet 10   tamsulosin  (FLOMAX ) 0.4 MG CAPS capsule Take 1 capsule (0.4 mg total) by mouth daily. 90 capsule 3   No facility-administered medications prior to  visit.     Per HPI unless specifically indicated in ROS section below Review of Systems  Constitutional:  Negative for fatigue and fever.  HENT:  Negative for ear pain.   Eyes:  Negative for pain.  Respiratory:  Positive for chest tightness. Negative for cough and shortness of breath.   Cardiovascular:  Negative for chest pain, palpitations and leg swelling.  Gastrointestinal:  Negative for abdominal pain.  Genitourinary:  Negative for dysuria.  Musculoskeletal:  Negative for arthralgias.  Neurological:  Negative for syncope, light-headedness and headaches.  Psychiatric/Behavioral:  Negative for dysphoric mood.    Objective:  BP (!) 200/64   Pulse (!) 55   Temp 98.4 F (36.9 C) (Temporal)   Ht 6' 2 (1.88 m)   Wt 256 lb 4 oz (116.2 kg)   SpO2 96%   BMI 32.90 kg/m   Wt Readings from Last 3 Encounters:  01/21/24 256 lb 4 oz (116.2 kg)  12/09/23 261 lb (118.4 kg)  08/05/23 259 lb 6.4 oz (117.7 kg)      Physical Exam Constitutional:      Appearance: He is well-developed.  HENT:     Head: Normocephalic.     Right Ear: Hearing normal.     Left Ear: Hearing normal.     Nose: Nose normal.  Neck:     Thyroid : No thyroid  mass or thyromegaly.     Vascular: No carotid bruit.     Trachea: Trachea normal.  Cardiovascular:     Rate and Rhythm: Normal rate and regular rhythm.     Pulses: Normal pulses.     Heart sounds: Heart sounds not distant. No murmur heard.    No friction rub. No gallop.  Pulmonary:     Effort: Pulmonary effort is normal. No respiratory distress.     Breath sounds: Normal breath sounds.  Musculoskeletal:     Right lower leg: 1+ Edema present.     Left lower leg: 1+ Edema present.  Skin:    General: Skin is warm and dry.     Findings: No rash.  Neurological:     Comments: Neck immobilizer.  Psychiatric:        Speech: Speech normal.        Behavior: Behavior normal.        Thought Content: Thought content normal.       Results for orders placed  or performed in visit on 07/29/23  Flow cytometry panel-leukemia/lymphoma work-up   Collection Time: 07/29/23  8:58 AM  Result Value Ref Range   PATH INTERP XXX-IMP Comment    ANNOTATION COMMENT IMP Comment    CLINICAL INFO Comment    Specimen Type Comment    ASSESSMENT OF LEUKOCYTES Comment    %  Viable Cells Comment    Immunophenotypic Profile 0    ANALYSIS AND GATING STRATEGY Comment    IMMUNOPHENOTYPING STUDY Comment    PATHOLOGIST NAME Comment    COMMENT: Comment   Immature Platelet Fraction   Collection Time: 07/29/23  8:58 AM  Result Value Ref Range   Immature Platelet Fraction 1.8 1.2 - 8.6 %  Ferritin   Collection Time: 07/29/23  8:58 AM  Result Value Ref Range   Ferritin 157 24 - 336 ng/mL  Iron  and TIBC   Collection Time: 07/29/23  8:58 AM  Result Value Ref Range   Iron  61 45 - 182 ug/dL   TIBC 648 749 - 549 ug/dL   Saturation Ratios 17 (L) 17.9 - 39.5 %   UIBC 290 ug/dL  CBC with Differential (Cancer Center Only)   Collection Time: 07/29/23  8:58 AM  Result Value Ref Range   WBC Count 3.8 (L) 4.0 - 10.5 K/uL   RBC 3.90 (L) 4.22 - 5.81 MIL/uL   Hemoglobin 12.1 (L) 13.0 - 17.0 g/dL   HCT 64.3 (L) 60.9 - 47.9 %   MCV 91.3 80.0 - 100.0 fL   MCH 31.0 26.0 - 34.0 pg   MCHC 34.0 30.0 - 36.0 g/dL   RDW 84.6 88.4 - 84.4 %   Platelet Count 104 (L) 150 - 400 K/uL   nRBC 0.0 0.0 - 0.2 %   Neutrophils Relative % 60 %   Neutro Abs 2.3 1.7 - 7.7 K/uL   Lymphocytes Relative 28 %   Lymphs Abs 1.1 0.7 - 4.0 K/uL   Monocytes Relative 8 %   Monocytes Absolute 0.3 0.1 - 1.0 K/uL   Eosinophils Relative 2 %   Eosinophils Absolute 0.1 0.0 - 0.5 K/uL   Basophils Relative 1 %   Basophils Absolute 0.0 0.0 - 0.1 K/uL   Immature Granulocytes 1 %   Abs Immature Granulocytes 0.02 0.00 - 0.07 K/uL  CMP (Cancer Center only)   Collection Time: 07/29/23  8:58 AM  Result Value Ref Range   Sodium 135 135 - 145 mmol/L   Potassium 4.5 3.5 - 5.1 mmol/L   Chloride 100 98 - 111 mmol/L    CO2 26 22 - 32 mmol/L   Glucose, Bld 322 (H) 70 - 99 mg/dL   BUN 43 (H) 8 - 23 mg/dL   Creatinine 8.64 (H) 9.38 - 1.24 mg/dL   Calcium  8.9 8.9 - 10.3 mg/dL   Total Protein 6.7 6.5 - 8.1 g/dL   Albumin 4.0 3.5 - 5.0 g/dL   AST 20 15 - 41 U/L   ALT 22 0 - 44 U/L   Alkaline Phosphatase 175 (H) 38 - 126 U/L   Total Bilirubin 0.7 0.0 - 1.2 mg/dL   GFR, Estimated 55 (L) >60 mL/min   Anion gap 9 5 - 15    Assessment and Plan  HTN (hypertension), malignant Assessment & Plan: Chronic with acute worsening postop.  Pain inadequately controlled, patient under increased stress and with some amount of anxiety associated with recent surgery. On losartan  100 mg daily with poor control of blood pressure.  Limited with medications available for use given past side effects to hydralazine , as well as chronic bradycardia limiting use of calcium  channel blockers and beta-blockers.  Will start with adding hydrochlorothiazide  25 mg daily. I also suggested to him to talk with his surgeon about better treatment of the postoperative pain   Post-operative pain Assessment & Plan: Inadequate control, trying to wean down oxycodone  and use Tylenol  for pain but this is not controlling pain and is likely contributing to frustration and blood pressure elevation. Recommended patient to contact surgeon regarding intermediate medication such as hydrocodone versus addition of medication such as muscle relaxants or Valium to help with muscle spasm and pain control.  They will recontact us  here at primary care if they are unable to reach the surgeon regarding postoperative pain. Return and ER precautions provided.   Other orders -     hydroCHLOROthiazide ; Take 1 tablet (25 mg total) by mouth daily.  Dispense: 30 tablet; Refill: 11    No follow-ups on file.   Greig Ring, MD

## 2024-01-21 NOTE — Telephone Encounter (Signed)
 Appointment with Dr. Avelina today at 3:00 pm.

## 2024-01-21 NOTE — Assessment & Plan Note (Addendum)
 Chronic with acute worsening postop.  Pain inadequately controlled, patient under increased stress and with some amount of anxiety associated with recent surgery. On losartan  100 mg daily with poor control of blood pressure.  Limited with medications available for use given past side effects to hydralazine , as well as chronic bradycardia limiting use of calcium  channel blockers and beta-blockers.  Will start with adding hydrochlorothiazide  25 mg daily. Wife will follow blood pressure at home and call PCP with measurements within the next week.  May need to contact cardiologist for suggestions of additional treatment if not well-controlled. I also suggested to him to talk with his surgeon about better treatment of the postoperative pain

## 2024-01-27 ENCOUNTER — Telehealth: Payer: Self-pay

## 2024-01-27 DIAGNOSIS — M4802 Spinal stenosis, cervical region: Secondary | ICD-10-CM | POA: Diagnosis not present

## 2024-01-27 DIAGNOSIS — E785 Hyperlipidemia, unspecified: Secondary | ICD-10-CM | POA: Diagnosis not present

## 2024-01-27 DIAGNOSIS — I10 Essential (primary) hypertension: Secondary | ICD-10-CM | POA: Diagnosis not present

## 2024-01-27 DIAGNOSIS — D693 Immune thrombocytopenic purpura: Secondary | ICD-10-CM | POA: Diagnosis not present

## 2024-01-27 DIAGNOSIS — E119 Type 2 diabetes mellitus without complications: Secondary | ICD-10-CM | POA: Diagnosis not present

## 2024-01-27 DIAGNOSIS — Z4789 Encounter for other orthopedic aftercare: Secondary | ICD-10-CM | POA: Diagnosis not present

## 2024-01-27 DIAGNOSIS — Z981 Arthrodesis status: Secondary | ICD-10-CM | POA: Diagnosis not present

## 2024-01-27 DIAGNOSIS — D649 Anemia, unspecified: Secondary | ICD-10-CM | POA: Diagnosis not present

## 2024-01-27 NOTE — Telephone Encounter (Signed)
 Copied from CRM #[id number]. Topic: Clinical - Medical Advice >> Jan 27, 2024 11:50 AM Anairis L wrote: Reason for CRM: Nurse Ozell with [facility] is calling in to report BP of 186/78 no symptoms. [phone]

## 2024-01-27 NOTE — Telephone Encounter (Signed)
 Please have him get several follow up BP readings and if persistent above 140/90 then please schedule OV.  Thanks.

## 2024-01-28 NOTE — Telephone Encounter (Signed)
 Noted. Thanks.

## 2024-01-28 NOTE — Telephone Encounter (Signed)
 Spoke with Dennis Wise with Home Health and states that the patients systolic blood pressure has consistently been in the 190's. Therefore I spoke with patients spouse and scheduled him to come in and be seen and to bring his BP readings. He is scheduled for 02/04/24.

## 2024-01-29 DIAGNOSIS — G8918 Other acute postprocedural pain: Secondary | ICD-10-CM | POA: Diagnosis not present

## 2024-02-04 ENCOUNTER — Ambulatory Visit: Admitting: Family Medicine

## 2024-02-04 ENCOUNTER — Inpatient Hospital Stay: Attending: Oncology

## 2024-02-04 ENCOUNTER — Encounter: Payer: Self-pay | Admitting: Family Medicine

## 2024-02-04 VITALS — BP 126/68 | HR 53 | Temp 98.6°F | Ht 74.0 in | Wt 233.0 lb

## 2024-02-04 DIAGNOSIS — S42402D Unspecified fracture of lower end of left humerus, subsequent encounter for fracture with routine healing: Secondary | ICD-10-CM | POA: Diagnosis not present

## 2024-02-04 DIAGNOSIS — S199XXA Unspecified injury of neck, initial encounter: Secondary | ICD-10-CM | POA: Diagnosis not present

## 2024-02-04 DIAGNOSIS — I213 ST elevation (STEMI) myocardial infarction of unspecified site: Secondary | ICD-10-CM | POA: Diagnosis not present

## 2024-02-04 DIAGNOSIS — D631 Anemia in chronic kidney disease: Secondary | ICD-10-CM | POA: Diagnosis present

## 2024-02-04 DIAGNOSIS — Z7401 Bed confinement status: Secondary | ICD-10-CM | POA: Diagnosis not present

## 2024-02-04 DIAGNOSIS — M5030 Other cervical disc degeneration, unspecified cervical region: Secondary | ICD-10-CM | POA: Diagnosis not present

## 2024-02-04 DIAGNOSIS — R269 Unspecified abnormalities of gait and mobility: Secondary | ICD-10-CM | POA: Diagnosis not present

## 2024-02-04 DIAGNOSIS — Z7902 Long term (current) use of antithrombotics/antiplatelets: Secondary | ICD-10-CM | POA: Diagnosis not present

## 2024-02-04 DIAGNOSIS — I517 Cardiomegaly: Secondary | ICD-10-CM | POA: Diagnosis not present

## 2024-02-04 DIAGNOSIS — Z23 Encounter for immunization: Secondary | ICD-10-CM | POA: Diagnosis not present

## 2024-02-04 DIAGNOSIS — N179 Acute kidney failure, unspecified: Secondary | ICD-10-CM | POA: Diagnosis present

## 2024-02-04 DIAGNOSIS — S0990XA Unspecified injury of head, initial encounter: Secondary | ICD-10-CM | POA: Diagnosis not present

## 2024-02-04 DIAGNOSIS — K760 Fatty (change of) liver, not elsewhere classified: Secondary | ICD-10-CM | POA: Diagnosis present

## 2024-02-04 DIAGNOSIS — I10 Essential (primary) hypertension: Secondary | ICD-10-CM | POA: Diagnosis not present

## 2024-02-04 DIAGNOSIS — F419 Anxiety disorder, unspecified: Secondary | ICD-10-CM | POA: Diagnosis not present

## 2024-02-04 DIAGNOSIS — G40909 Epilepsy, unspecified, not intractable, without status epilepticus: Secondary | ICD-10-CM | POA: Diagnosis present

## 2024-02-04 DIAGNOSIS — E785 Hyperlipidemia, unspecified: Secondary | ICD-10-CM | POA: Diagnosis not present

## 2024-02-04 DIAGNOSIS — E113299 Type 2 diabetes mellitus with mild nonproliferative diabetic retinopathy without macular edema, unspecified eye: Secondary | ICD-10-CM

## 2024-02-04 DIAGNOSIS — S72002A Fracture of unspecified part of neck of left femur, initial encounter for closed fracture: Secondary | ICD-10-CM | POA: Diagnosis not present

## 2024-02-04 DIAGNOSIS — Y92009 Unspecified place in unspecified non-institutional (private) residence as the place of occurrence of the external cause: Secondary | ICD-10-CM | POA: Diagnosis not present

## 2024-02-04 DIAGNOSIS — M25552 Pain in left hip: Secondary | ICD-10-CM | POA: Diagnosis present

## 2024-02-04 DIAGNOSIS — D61818 Other pancytopenia: Secondary | ICD-10-CM | POA: Diagnosis not present

## 2024-02-04 DIAGNOSIS — K5901 Slow transit constipation: Secondary | ICD-10-CM | POA: Diagnosis not present

## 2024-02-04 DIAGNOSIS — E559 Vitamin D deficiency, unspecified: Secondary | ICD-10-CM | POA: Diagnosis not present

## 2024-02-04 DIAGNOSIS — W19XXXA Unspecified fall, initial encounter: Secondary | ICD-10-CM | POA: Diagnosis not present

## 2024-02-04 DIAGNOSIS — J670 Farmer's lung: Secondary | ICD-10-CM | POA: Diagnosis present

## 2024-02-04 DIAGNOSIS — I129 Hypertensive chronic kidney disease with stage 1 through stage 4 chronic kidney disease, or unspecified chronic kidney disease: Secondary | ICD-10-CM | POA: Diagnosis present

## 2024-02-04 DIAGNOSIS — S52092A Other fracture of upper end of left ulna, initial encounter for closed fracture: Secondary | ICD-10-CM | POA: Diagnosis not present

## 2024-02-04 DIAGNOSIS — S72042A Displaced fracture of base of neck of left femur, initial encounter for closed fracture: Secondary | ICD-10-CM | POA: Diagnosis not present

## 2024-02-04 DIAGNOSIS — Z981 Arthrodesis status: Secondary | ICD-10-CM | POA: Diagnosis not present

## 2024-02-04 DIAGNOSIS — S62330A Displaced fracture of neck of second metacarpal bone, right hand, initial encounter for closed fracture: Secondary | ICD-10-CM | POA: Diagnosis present

## 2024-02-04 DIAGNOSIS — S50312A Abrasion of left elbow, initial encounter: Secondary | ICD-10-CM | POA: Diagnosis not present

## 2024-02-04 DIAGNOSIS — Z66 Do not resuscitate: Secondary | ICD-10-CM | POA: Diagnosis present

## 2024-02-04 DIAGNOSIS — E1122 Type 2 diabetes mellitus with diabetic chronic kidney disease: Secondary | ICD-10-CM | POA: Diagnosis present

## 2024-02-04 DIAGNOSIS — S52002A Unspecified fracture of upper end of left ulna, initial encounter for closed fracture: Secondary | ICD-10-CM | POA: Diagnosis present

## 2024-02-04 DIAGNOSIS — S42402A Unspecified fracture of lower end of left humerus, initial encounter for closed fracture: Secondary | ICD-10-CM | POA: Diagnosis not present

## 2024-02-04 DIAGNOSIS — Z96642 Presence of left artificial hip joint: Secondary | ICD-10-CM | POA: Diagnosis not present

## 2024-02-04 DIAGNOSIS — W19XXXD Unspecified fall, subsequent encounter: Secondary | ICD-10-CM | POA: Diagnosis not present

## 2024-02-04 DIAGNOSIS — E1165 Type 2 diabetes mellitus with hyperglycemia: Secondary | ICD-10-CM | POA: Diagnosis present

## 2024-02-04 DIAGNOSIS — G9389 Other specified disorders of brain: Secondary | ICD-10-CM | POA: Diagnosis present

## 2024-02-04 DIAGNOSIS — N1831 Chronic kidney disease, stage 3a: Secondary | ICD-10-CM | POA: Diagnosis present

## 2024-02-04 DIAGNOSIS — D62 Acute posthemorrhagic anemia: Secondary | ICD-10-CM | POA: Diagnosis not present

## 2024-02-04 DIAGNOSIS — E119 Type 2 diabetes mellitus without complications: Secondary | ICD-10-CM | POA: Diagnosis not present

## 2024-02-04 DIAGNOSIS — S72122A Displaced fracture of lesser trochanter of left femur, initial encounter for closed fracture: Secondary | ICD-10-CM | POA: Diagnosis not present

## 2024-02-04 DIAGNOSIS — S72032A Displaced midcervical fracture of left femur, initial encounter for closed fracture: Secondary | ICD-10-CM | POA: Diagnosis not present

## 2024-02-04 DIAGNOSIS — W1839XA Other fall on same level, initial encounter: Secondary | ICD-10-CM | POA: Diagnosis present

## 2024-02-04 DIAGNOSIS — S72142D Displaced intertrochanteric fracture of left femur, subsequent encounter for closed fracture with routine healing: Secondary | ICD-10-CM | POA: Diagnosis not present

## 2024-02-04 DIAGNOSIS — R55 Syncope and collapse: Secondary | ICD-10-CM | POA: Diagnosis not present

## 2024-02-04 DIAGNOSIS — M8448XA Pathological fracture, other site, initial encounter for fracture: Secondary | ICD-10-CM | POA: Diagnosis present

## 2024-02-04 DIAGNOSIS — M47812 Spondylosis without myelopathy or radiculopathy, cervical region: Secondary | ICD-10-CM | POA: Diagnosis not present

## 2024-02-04 DIAGNOSIS — S62300A Unspecified fracture of second metacarpal bone, right hand, initial encounter for closed fracture: Secondary | ICD-10-CM | POA: Diagnosis not present

## 2024-02-04 DIAGNOSIS — R0989 Other specified symptoms and signs involving the circulatory and respiratory systems: Secondary | ICD-10-CM | POA: Diagnosis not present

## 2024-02-04 DIAGNOSIS — S299XXA Unspecified injury of thorax, initial encounter: Secondary | ICD-10-CM | POA: Diagnosis not present

## 2024-02-04 DIAGNOSIS — Z9889 Other specified postprocedural states: Secondary | ICD-10-CM | POA: Diagnosis not present

## 2024-02-04 DIAGNOSIS — F0153 Vascular dementia, unspecified severity, with mood disturbance: Secondary | ICD-10-CM | POA: Diagnosis present

## 2024-02-04 DIAGNOSIS — S72142A Displaced intertrochanteric fracture of left femur, initial encounter for closed fracture: Secondary | ICD-10-CM | POA: Diagnosis not present

## 2024-02-04 DIAGNOSIS — S52132A Displaced fracture of neck of left radius, initial encounter for closed fracture: Secondary | ICD-10-CM | POA: Diagnosis present

## 2024-02-04 DIAGNOSIS — I700 Atherosclerosis of aorta: Secondary | ICD-10-CM | POA: Diagnosis not present

## 2024-02-04 DIAGNOSIS — S52032A Displaced fracture of olecranon process with intraarticular extension of left ulna, initial encounter for closed fracture: Secondary | ICD-10-CM | POA: Diagnosis present

## 2024-02-04 DIAGNOSIS — S52022A Displaced fracture of olecranon process without intraarticular extension of left ulna, initial encounter for closed fracture: Secondary | ICD-10-CM | POA: Diagnosis not present

## 2024-02-04 DIAGNOSIS — S42302A Unspecified fracture of shaft of humerus, left arm, initial encounter for closed fracture: Secondary | ICD-10-CM | POA: Diagnosis not present

## 2024-02-04 DIAGNOSIS — H53462 Homonymous bilateral field defects, left side: Secondary | ICD-10-CM | POA: Diagnosis present

## 2024-02-04 DIAGNOSIS — M85841 Other specified disorders of bone density and structure, right hand: Secondary | ICD-10-CM | POA: Diagnosis not present

## 2024-02-04 DIAGNOSIS — S0181XA Laceration without foreign body of other part of head, initial encounter: Secondary | ICD-10-CM | POA: Diagnosis not present

## 2024-02-04 DIAGNOSIS — N183 Chronic kidney disease, stage 3 unspecified: Secondary | ICD-10-CM | POA: Diagnosis not present

## 2024-02-04 DIAGNOSIS — S72332A Displaced oblique fracture of shaft of left femur, initial encounter for closed fracture: Secondary | ICD-10-CM | POA: Diagnosis not present

## 2024-02-04 DIAGNOSIS — D472 Monoclonal gammopathy: Secondary | ICD-10-CM | POA: Diagnosis present

## 2024-02-04 DIAGNOSIS — G2581 Restless legs syndrome: Secondary | ICD-10-CM | POA: Diagnosis present

## 2024-02-04 DIAGNOSIS — S0291XA Unspecified fracture of skull, initial encounter for closed fracture: Secondary | ICD-10-CM | POA: Diagnosis not present

## 2024-02-04 DIAGNOSIS — S52042A Displaced fracture of coronoid process of left ulna, initial encounter for closed fracture: Secondary | ICD-10-CM | POA: Diagnosis not present

## 2024-02-04 DIAGNOSIS — I69359 Hemiplegia and hemiparesis following cerebral infarction affecting unspecified side: Secondary | ICD-10-CM | POA: Diagnosis not present

## 2024-02-04 DIAGNOSIS — Z471 Aftercare following joint replacement surgery: Secondary | ICD-10-CM | POA: Diagnosis not present

## 2024-02-04 DIAGNOSIS — E11319 Type 2 diabetes mellitus with unspecified diabetic retinopathy without macular edema: Secondary | ICD-10-CM | POA: Diagnosis present

## 2024-02-04 DIAGNOSIS — D508 Other iron deficiency anemias: Secondary | ICD-10-CM

## 2024-02-04 DIAGNOSIS — R609 Edema, unspecified: Secondary | ICD-10-CM | POA: Diagnosis not present

## 2024-02-04 DIAGNOSIS — R569 Unspecified convulsions: Secondary | ICD-10-CM | POA: Diagnosis not present

## 2024-02-04 DIAGNOSIS — S0993XA Unspecified injury of face, initial encounter: Secondary | ICD-10-CM | POA: Diagnosis not present

## 2024-02-04 LAB — CBC WITH DIFFERENTIAL (CANCER CENTER ONLY)
Abs Immature Granulocytes: 0.02 K/uL (ref 0.00–0.07)
Basophils Absolute: 0 K/uL (ref 0.0–0.1)
Basophils Relative: 1 %
Eosinophils Absolute: 0.2 K/uL (ref 0.0–0.5)
Eosinophils Relative: 6 %
HCT: 33.8 % — ABNORMAL LOW (ref 39.0–52.0)
Hemoglobin: 11 g/dL — ABNORMAL LOW (ref 13.0–17.0)
Immature Granulocytes: 1 %
Lymphocytes Relative: 32 %
Lymphs Abs: 1.1 K/uL (ref 0.7–4.0)
MCH: 28.6 pg (ref 26.0–34.0)
MCHC: 32.5 g/dL (ref 30.0–36.0)
MCV: 87.8 fL (ref 80.0–100.0)
Monocytes Absolute: 0.4 K/uL (ref 0.1–1.0)
Monocytes Relative: 10 %
Neutro Abs: 1.9 K/uL (ref 1.7–7.7)
Neutrophils Relative %: 50 %
Platelet Count: 95 K/uL — ABNORMAL LOW (ref 150–400)
RBC: 3.85 MIL/uL — ABNORMAL LOW (ref 4.22–5.81)
RDW: 14.6 % (ref 11.5–15.5)
WBC Count: 3.6 K/uL — ABNORMAL LOW (ref 4.0–10.5)
nRBC: 0 % (ref 0.0–0.2)

## 2024-02-04 LAB — CMP (CANCER CENTER ONLY)
ALT: 11 U/L (ref 0–44)
AST: 17 U/L (ref 15–41)
Albumin: 4 g/dL (ref 3.5–5.0)
Alkaline Phosphatase: 131 U/L — ABNORMAL HIGH (ref 38–126)
Anion gap: 10 (ref 5–15)
BUN: 37 mg/dL — ABNORMAL HIGH (ref 8–23)
CO2: 26 mmol/L (ref 22–32)
Calcium: 9.1 mg/dL (ref 8.9–10.3)
Chloride: 98 mmol/L (ref 98–111)
Creatinine: 1.71 mg/dL — ABNORMAL HIGH (ref 0.61–1.24)
GFR, Estimated: 41 mL/min — ABNORMAL LOW (ref >60)
Glucose, Bld: 123 mg/dL — ABNORMAL HIGH (ref 70–99)
Potassium: 4.2 mmol/L (ref 3.5–5.1)
Sodium: 134 mmol/L — ABNORMAL LOW (ref 135–145)
Total Bilirubin: 0.8 mg/dL (ref 0.0–1.2)
Total Protein: 7.1 g/dL (ref 6.5–8.1)

## 2024-02-04 LAB — RETIC PANEL
Immature Retic Fract: 11.3 % (ref 2.3–15.9)
RBC.: 3.84 MIL/uL — ABNORMAL LOW (ref 4.22–5.81)
Retic Count, Absolute: 78 K/uL (ref 19.0–186.0)
Retic Ct Pct: 2 % (ref 0.4–3.1)
Reticulocyte Hemoglobin: 34 pg (ref >27.9)

## 2024-02-04 LAB — POCT GLYCOSYLATED HEMOGLOBIN (HGB A1C): Hemoglobin A1C: 6.3 % — AB (ref 4.0–5.6)

## 2024-02-04 LAB — FERRITIN: Ferritin: 251 ng/mL (ref 24–336)

## 2024-02-04 LAB — IRON AND TIBC
Iron: 67 ug/dL (ref 45–182)
Saturation Ratios: 21 % (ref 17.9–39.5)
TIBC: 328 ug/dL (ref 250–450)
UIBC: 261 ug/dL

## 2024-02-04 MED ORDER — GLIMEPIRIDE 2 MG PO TABS: ORAL_TABLET | ORAL | Status: DC

## 2024-02-04 MED ORDER — HYDROCHLOROTHIAZIDE 25 MG PO TABS: 12.5000 mg | ORAL_TABLET | Freq: Every day | ORAL | Status: DC

## 2024-02-04 NOTE — Patient Instructions (Addendum)
 Try cutting hydrochlorothiazide  in half and update me if your BP is low or high or if you have any more weight loss.   I would hold glimepiride  for now.  If your sugar is significantly higher, then I would restart.   Plan on recheck in 3 months.  Please update me about your sugar and BP in about 10 days.    Take care.  Glad to see you.

## 2024-02-04 NOTE — Progress Notes (Signed)
 S/p C2 to T2 posterior fusion.  Still in brace and using a walker.  Staples out on 01/29/24.  BP improved in the meantime.    Cr 1.7 today.  Usually 1.3.  He has not been taking ibuprofen.  He hasn't been drinking a lot of fluid, that may influence the Cr today.  Prev with elevated BP.  BP reasonable today.    Diabetes:  Using medications without difficulties: yes Hypoglycemic episodes:no Hyperglycemic episodes:no Blood Sugars averaging: 100-150, on the lower end of range recently.  A1c done at OV, d/w pt.   Meds, vitals, and allergies reviewed.   ROS: Per HPI unless specifically indicated in ROS section   Nad Ncat In [place] J neck brace, using walker at baseline.  Rrr Ctab Abd soft, not ttp Ext w/o edema.  Skin well perfused.

## 2024-02-07 ENCOUNTER — Emergency Department (HOSPITAL_COMMUNITY)

## 2024-02-07 ENCOUNTER — Inpatient Hospital Stay (HOSPITAL_COMMUNITY)
Admission: EM | Admit: 2024-02-07 | Discharge: 2024-02-17 | DRG: 470 | Disposition: A | Discharge status: Skilled Nursing Facility | Attending: Internal Medicine | Admitting: Internal Medicine

## 2024-02-07 ENCOUNTER — Other Ambulatory Visit: Payer: Self-pay

## 2024-02-07 ENCOUNTER — Inpatient Hospital Stay (HOSPITAL_COMMUNITY)

## 2024-02-07 ENCOUNTER — Encounter (HOSPITAL_COMMUNITY): Payer: Self-pay

## 2024-02-07 DIAGNOSIS — M5030 Other cervical disc degeneration, unspecified cervical region: Secondary | ICD-10-CM | POA: Diagnosis not present

## 2024-02-07 DIAGNOSIS — Z7902 Long term (current) use of antithrombotics/antiplatelets: Secondary | ICD-10-CM | POA: Diagnosis not present

## 2024-02-07 DIAGNOSIS — S72002A Fracture of unspecified part of neck of left femur, initial encounter for closed fracture: Secondary | ICD-10-CM | POA: Diagnosis not present

## 2024-02-07 DIAGNOSIS — W19XXXA Unspecified fall, initial encounter: Secondary | ICD-10-CM

## 2024-02-07 DIAGNOSIS — K760 Fatty (change of) liver, not elsewhere classified: Secondary | ICD-10-CM | POA: Diagnosis present

## 2024-02-07 DIAGNOSIS — J670 Farmer's lung: Secondary | ICD-10-CM | POA: Diagnosis present

## 2024-02-07 DIAGNOSIS — Z66 Do not resuscitate: Secondary | ICD-10-CM | POA: Diagnosis present

## 2024-02-07 DIAGNOSIS — S0181XA Laceration without foreign body of other part of head, initial encounter: Secondary | ICD-10-CM | POA: Diagnosis present

## 2024-02-07 DIAGNOSIS — F0153 Vascular dementia, unspecified severity, with mood disturbance: Secondary | ICD-10-CM | POA: Diagnosis present

## 2024-02-07 DIAGNOSIS — E119 Type 2 diabetes mellitus without complications: Secondary | ICD-10-CM | POA: Diagnosis not present

## 2024-02-07 DIAGNOSIS — S299XXA Unspecified injury of thorax, initial encounter: Secondary | ICD-10-CM | POA: Diagnosis not present

## 2024-02-07 DIAGNOSIS — G2581 Restless legs syndrome: Secondary | ICD-10-CM | POA: Diagnosis present

## 2024-02-07 DIAGNOSIS — S50312A Abrasion of left elbow, initial encounter: Secondary | ICD-10-CM

## 2024-02-07 DIAGNOSIS — F411 Generalized anxiety disorder: Secondary | ICD-10-CM | POA: Diagnosis present

## 2024-02-07 DIAGNOSIS — D631 Anemia in chronic kidney disease: Secondary | ICD-10-CM | POA: Diagnosis present

## 2024-02-07 DIAGNOSIS — E11319 Type 2 diabetes mellitus with unspecified diabetic retinopathy without macular edema: Secondary | ICD-10-CM | POA: Diagnosis present

## 2024-02-07 DIAGNOSIS — N179 Acute kidney failure, unspecified: Secondary | ICD-10-CM | POA: Diagnosis present

## 2024-02-07 DIAGNOSIS — I700 Atherosclerosis of aorta: Secondary | ICD-10-CM | POA: Diagnosis not present

## 2024-02-07 DIAGNOSIS — E1165 Type 2 diabetes mellitus with hyperglycemia: Secondary | ICD-10-CM | POA: Diagnosis present

## 2024-02-07 DIAGNOSIS — S52132A Displaced fracture of neck of left radius, initial encounter for closed fracture: Secondary | ICD-10-CM | POA: Diagnosis present

## 2024-02-07 DIAGNOSIS — Z833 Family history of diabetes mellitus: Secondary | ICD-10-CM

## 2024-02-07 DIAGNOSIS — S62330A Displaced fracture of neck of second metacarpal bone, right hand, initial encounter for closed fracture: Secondary | ICD-10-CM | POA: Diagnosis present

## 2024-02-07 DIAGNOSIS — S52032A Displaced fracture of olecranon process with intraarticular extension of left ulna, initial encounter for closed fracture: Secondary | ICD-10-CM | POA: Diagnosis present

## 2024-02-07 DIAGNOSIS — Z8701 Personal history of pneumonia (recurrent): Secondary | ICD-10-CM

## 2024-02-07 DIAGNOSIS — M8448XA Pathological fracture, other site, initial encounter for fracture: Principal | ICD-10-CM | POA: Diagnosis present

## 2024-02-07 DIAGNOSIS — E1122 Type 2 diabetes mellitus with diabetic chronic kidney disease: Secondary | ICD-10-CM | POA: Diagnosis present

## 2024-02-07 DIAGNOSIS — H53462 Homonymous bilateral field defects, left side: Secondary | ICD-10-CM | POA: Diagnosis present

## 2024-02-07 DIAGNOSIS — G9389 Other specified disorders of brain: Secondary | ICD-10-CM | POA: Diagnosis not present

## 2024-02-07 DIAGNOSIS — S42402A Unspecified fracture of lower end of left humerus, initial encounter for closed fracture: Secondary | ICD-10-CM | POA: Diagnosis not present

## 2024-02-07 DIAGNOSIS — S199XXA Unspecified injury of neck, initial encounter: Secondary | ICD-10-CM | POA: Diagnosis not present

## 2024-02-07 DIAGNOSIS — D649 Anemia, unspecified: Secondary | ICD-10-CM | POA: Diagnosis present

## 2024-02-07 DIAGNOSIS — Z9841 Cataract extraction status, right eye: Secondary | ICD-10-CM

## 2024-02-07 DIAGNOSIS — Z8249 Family history of ischemic heart disease and other diseases of the circulatory system: Secondary | ICD-10-CM

## 2024-02-07 DIAGNOSIS — M25552 Pain in left hip: Secondary | ICD-10-CM | POA: Diagnosis present

## 2024-02-07 DIAGNOSIS — M720 Palmar fascial fibromatosis [Dupuytren]: Secondary | ICD-10-CM | POA: Diagnosis present

## 2024-02-07 DIAGNOSIS — S72332A Displaced oblique fracture of shaft of left femur, initial encounter for closed fracture: Secondary | ICD-10-CM | POA: Diagnosis not present

## 2024-02-07 DIAGNOSIS — S0993XA Unspecified injury of face, initial encounter: Secondary | ICD-10-CM | POA: Diagnosis not present

## 2024-02-07 DIAGNOSIS — I10 Essential (primary) hypertension: Secondary | ICD-10-CM | POA: Diagnosis not present

## 2024-02-07 DIAGNOSIS — S52002A Unspecified fracture of upper end of left ulna, initial encounter for closed fracture: Secondary | ICD-10-CM | POA: Diagnosis present

## 2024-02-07 DIAGNOSIS — R569 Unspecified convulsions: Secondary | ICD-10-CM | POA: Diagnosis not present

## 2024-02-07 DIAGNOSIS — Z79899 Other long term (current) drug therapy: Secondary | ICD-10-CM

## 2024-02-07 DIAGNOSIS — Y92009 Unspecified place in unspecified non-institutional (private) residence as the place of occurrence of the external cause: Secondary | ICD-10-CM

## 2024-02-07 DIAGNOSIS — R55 Syncope and collapse: Secondary | ICD-10-CM | POA: Diagnosis not present

## 2024-02-07 DIAGNOSIS — W1839XA Other fall on same level, initial encounter: Secondary | ICD-10-CM | POA: Diagnosis present

## 2024-02-07 DIAGNOSIS — G40909 Epilepsy, unspecified, not intractable, without status epilepticus: Secondary | ICD-10-CM | POA: Diagnosis present

## 2024-02-07 DIAGNOSIS — N1831 Chronic kidney disease, stage 3a: Secondary | ICD-10-CM | POA: Diagnosis present

## 2024-02-07 DIAGNOSIS — M8588 Other specified disorders of bone density and structure, other site: Secondary | ICD-10-CM | POA: Diagnosis present

## 2024-02-07 DIAGNOSIS — I129 Hypertensive chronic kidney disease with stage 1 through stage 4 chronic kidney disease, or unspecified chronic kidney disease: Secondary | ICD-10-CM | POA: Diagnosis present

## 2024-02-07 DIAGNOSIS — Z23 Encounter for immunization: Secondary | ICD-10-CM | POA: Diagnosis not present

## 2024-02-07 DIAGNOSIS — F419 Anxiety disorder, unspecified: Secondary | ICD-10-CM | POA: Diagnosis not present

## 2024-02-07 DIAGNOSIS — E7800 Pure hypercholesterolemia, unspecified: Secondary | ICD-10-CM | POA: Diagnosis present

## 2024-02-07 DIAGNOSIS — Z9842 Cataract extraction status, left eye: Secondary | ICD-10-CM

## 2024-02-07 DIAGNOSIS — Z8673 Personal history of transient ischemic attack (TIA), and cerebral infarction without residual deficits: Secondary | ICD-10-CM

## 2024-02-07 DIAGNOSIS — E559 Vitamin D deficiency, unspecified: Secondary | ICD-10-CM | POA: Diagnosis present

## 2024-02-07 DIAGNOSIS — F0670 Mild neurocognitive disorder due to known physiological condition without behavioral disturbance: Secondary | ICD-10-CM | POA: Diagnosis present

## 2024-02-07 DIAGNOSIS — Z860100 Personal history of colon polyps, unspecified: Secondary | ICD-10-CM

## 2024-02-07 DIAGNOSIS — D62 Acute posthemorrhagic anemia: Secondary | ICD-10-CM | POA: Diagnosis not present

## 2024-02-07 DIAGNOSIS — D472 Monoclonal gammopathy: Secondary | ICD-10-CM | POA: Diagnosis present

## 2024-02-07 DIAGNOSIS — Z961 Presence of intraocular lens: Secondary | ICD-10-CM | POA: Diagnosis present

## 2024-02-07 DIAGNOSIS — Z7984 Long term (current) use of oral hypoglycemic drugs: Secondary | ICD-10-CM

## 2024-02-07 DIAGNOSIS — Z981 Arthrodesis status: Secondary | ICD-10-CM

## 2024-02-07 DIAGNOSIS — Z888 Allergy status to other drugs, medicaments and biological substances status: Secondary | ICD-10-CM

## 2024-02-07 DIAGNOSIS — D509 Iron deficiency anemia, unspecified: Secondary | ICD-10-CM | POA: Diagnosis present

## 2024-02-07 DIAGNOSIS — Z8619 Personal history of other infectious and parasitic diseases: Secondary | ICD-10-CM

## 2024-02-07 DIAGNOSIS — I999 Unspecified disorder of circulatory system: Secondary | ICD-10-CM | POA: Diagnosis present

## 2024-02-07 DIAGNOSIS — S52092A Other fracture of upper end of left ulna, initial encounter for closed fracture: Secondary | ICD-10-CM | POA: Diagnosis not present

## 2024-02-07 DIAGNOSIS — E113299 Type 2 diabetes mellitus with mild nonproliferative diabetic retinopathy without macular edema, unspecified eye: Secondary | ICD-10-CM | POA: Diagnosis present

## 2024-02-07 DIAGNOSIS — E785 Hyperlipidemia, unspecified: Secondary | ICD-10-CM | POA: Diagnosis present

## 2024-02-07 DIAGNOSIS — D72820 Lymphocytosis (symptomatic): Secondary | ICD-10-CM | POA: Diagnosis present

## 2024-02-07 DIAGNOSIS — M47812 Spondylosis without myelopathy or radiculopathy, cervical region: Secondary | ICD-10-CM | POA: Diagnosis not present

## 2024-02-07 DIAGNOSIS — L570 Actinic keratosis: Secondary | ICD-10-CM | POA: Diagnosis present

## 2024-02-07 DIAGNOSIS — S0990XA Unspecified injury of head, initial encounter: Secondary | ICD-10-CM | POA: Diagnosis not present

## 2024-02-07 DIAGNOSIS — I517 Cardiomegaly: Secondary | ICD-10-CM | POA: Diagnosis not present

## 2024-02-07 LAB — CBC WITH DIFFERENTIAL/PLATELET
Abs Immature Granulocytes: 0.03 K/uL (ref 0.00–0.07)
Basophils Absolute: 0 K/uL (ref 0.0–0.1)
Basophils Relative: 0 %
Eosinophils Absolute: 0.1 K/uL (ref 0.0–0.5)
Eosinophils Relative: 1 %
HCT: 28 % — ABNORMAL LOW (ref 39.0–52.0)
Hemoglobin: 9.3 g/dL — ABNORMAL LOW (ref 13.0–17.0)
Immature Granulocytes: 1 %
Lymphocytes Relative: 15 %
Lymphs Abs: 0.9 K/uL (ref 0.7–4.0)
MCH: 29.6 pg (ref 26.0–34.0)
MCHC: 33.2 g/dL (ref 30.0–36.0)
MCV: 89.2 fL (ref 80.0–100.0)
Monocytes Absolute: 0.6 K/uL (ref 0.1–1.0)
Monocytes Relative: 10 %
Neutro Abs: 4.3 K/uL (ref 1.7–7.7)
Neutrophils Relative %: 73 %
Platelets: 102 K/uL — ABNORMAL LOW (ref 150–400)
RBC: 3.14 MIL/uL — ABNORMAL LOW (ref 4.22–5.81)
RDW: 14.9 % (ref 11.5–15.5)
WBC: 6 K/uL (ref 4.0–10.5)
nRBC: 0 % (ref 0.0–0.2)

## 2024-02-07 LAB — I-STAT CHEM 8, ED
BUN: 41 mg/dL — ABNORMAL HIGH (ref 8–23)
Calcium, Ion: 1.15 mmol/L (ref 1.15–1.40)
Chloride: 100 mmol/L (ref 98–111)
Creatinine, Ser: 1.7 mg/dL — ABNORMAL HIGH (ref 0.61–1.24)
Glucose, Bld: 249 mg/dL — ABNORMAL HIGH (ref 70–99)
HCT: 27 % — ABNORMAL LOW (ref 39.0–52.0)
Hemoglobin: 9.2 g/dL — ABNORMAL LOW (ref 13.0–17.0)
Potassium: 3.8 mmol/L (ref 3.5–5.1)
Sodium: 136 mmol/L (ref 135–145)
TCO2: 23 mmol/L (ref 22–32)

## 2024-02-07 LAB — GLUCOSE, CAPILLARY: Glucose-Capillary: 192 mg/dL — ABNORMAL HIGH (ref 70–99)

## 2024-02-07 LAB — CBG MONITORING, ED
Glucose-Capillary: 289 mg/dL — ABNORMAL HIGH (ref 70–99)
Glucose-Capillary: 203 mg/dL — ABNORMAL HIGH (ref 70–99)

## 2024-02-07 LAB — VITAMIN D 25 HYDROXY (VIT D DEFICIENCY, FRACTURES): Vit D, 25-Hydroxy: 29.05 ng/mL — ABNORMAL LOW (ref 30–100)

## 2024-02-07 LAB — ETHANOL: Alcohol, Ethyl (B): <15 mg/dL (ref <15)

## 2024-02-07 MED ORDER — ESCITALOPRAM OXALATE 10 MG PO TABS
10.0000 mg | ORAL_TABLET | Freq: Every day | ORAL | Status: DC
  Administered 2024-02-07 – 2024-02-17 (×9): 10 mg via ORAL
  Filled 2024-02-07 (×10): qty 1

## 2024-02-07 MED ORDER — ATORVASTATIN CALCIUM 10 MG PO TABS
10.0000 mg | ORAL_TABLET | Freq: Every day | ORAL | Status: DC
  Administered 2024-02-07 – 2024-02-16 (×10): 10 mg via ORAL
  Filled 2024-02-07 (×10): qty 1

## 2024-02-07 MED ORDER — HYDROCHLOROTHIAZIDE 12.5 MG PO TABS
12.5000 mg | ORAL_TABLET | Freq: Every day | ORAL | Status: DC
  Administered 2024-02-07: 12.5 mg via ORAL
  Filled 2024-02-07 (×2): qty 1

## 2024-02-07 MED ORDER — GABAPENTIN 300 MG PO CAPS
300.0000 mg | ORAL_CAPSULE | Freq: Three times a day (TID) | ORAL | Status: DC
  Administered 2024-02-07 – 2024-02-17 (×28): 300 mg via ORAL
  Filled 2024-02-07 (×30): qty 1

## 2024-02-07 MED ORDER — ACETAMINOPHEN 500 MG PO TABS
1000.0000 mg | ORAL_TABLET | Freq: Three times a day (TID) | ORAL | Status: DC
  Administered 2024-02-07 – 2024-02-17 (×28): 1000 mg via ORAL
  Filled 2024-02-07 (×30): qty 2

## 2024-02-07 MED ORDER — METHOCARBAMOL 500 MG PO TABS
500.0000 mg | ORAL_TABLET | Freq: Three times a day (TID) | ORAL | Status: DC
  Administered 2024-02-07 – 2024-02-17 (×29): 500 mg via ORAL
  Filled 2024-02-07 (×30): qty 1

## 2024-02-07 MED ORDER — FENTANYL CITRATE (PF) 50 MCG/ML IJ SOSY
50.0000 ug | PREFILLED_SYRINGE | Freq: Once | INTRAMUSCULAR | Status: AC
  Administered 2024-02-07: 50 ug via INTRAVENOUS
  Filled 2024-02-07: qty 1

## 2024-02-07 MED ORDER — SODIUM CHLORIDE 0.9 % IV SOLN: INTRAVENOUS | Status: DC

## 2024-02-07 MED ORDER — OXYCODONE HCL 5 MG PO TABS
5.0000 mg | ORAL_TABLET | ORAL | Status: DC | PRN
  Administered 2024-02-07 – 2024-02-17 (×17): 5 mg via ORAL
  Filled 2024-02-07 (×18): qty 1

## 2024-02-07 MED ORDER — MEMANTINE HCL 10 MG PO TABS
5.0000 mg | ORAL_TABLET | Freq: Two times a day (BID) | ORAL | Status: DC
  Administered 2024-02-07 – 2024-02-17 (×19): 5 mg via ORAL
  Filled 2024-02-07 (×20): qty 1

## 2024-02-07 MED ORDER — TETANUS-DIPHTH-ACELL PERTUSSIS 5-2-15.5 LF-MCG/0.5 IM SUSP
0.5000 mL | Freq: Once | INTRAMUSCULAR | Status: AC
  Administered 2024-02-07: 0.5 mL via INTRAMUSCULAR
  Filled 2024-02-07: qty 0.5

## 2024-02-07 MED ORDER — INSULIN ASPART 100 UNIT/ML IJ SOLN
0.0000 [IU] | Freq: Three times a day (TID) | INTRAMUSCULAR | Status: DC
  Administered 2024-02-07: 8 [IU] via SUBCUTANEOUS
  Administered 2024-02-07 – 2024-02-08 (×2): 5 [IU] via SUBCUTANEOUS
  Administered 2024-02-08: 3 [IU] via SUBCUTANEOUS
  Administered 2024-02-09: 5 [IU] via SUBCUTANEOUS
  Administered 2024-02-09 (×2): 8 [IU] via SUBCUTANEOUS
  Administered 2024-02-10 – 2024-02-11 (×3): 5 [IU] via SUBCUTANEOUS
  Administered 2024-02-11: 8 [IU] via SUBCUTANEOUS
  Administered 2024-02-12: 3 [IU] via SUBCUTANEOUS
  Administered 2024-02-12 (×2): 5 [IU] via SUBCUTANEOUS
  Administered 2024-02-13: 11 [IU] via SUBCUTANEOUS
  Administered 2024-02-13 – 2024-02-14 (×3): 5 [IU] via SUBCUTANEOUS
  Administered 2024-02-14 – 2024-02-15 (×3): 3 [IU] via SUBCUTANEOUS
  Administered 2024-02-15 (×2): 5 [IU] via SUBCUTANEOUS
  Administered 2024-02-16: 3 [IU] via SUBCUTANEOUS
  Administered 2024-02-16 (×2): 5 [IU] via SUBCUTANEOUS
  Administered 2024-02-17: 3 [IU] via SUBCUTANEOUS

## 2024-02-07 MED ORDER — ONDANSETRON HCL 4 MG/2ML IJ SOLN
4.0000 mg | Freq: Once | INTRAMUSCULAR | Status: AC
  Administered 2024-02-07: 4 mg via INTRAVENOUS
  Filled 2024-02-07: qty 2

## 2024-02-07 MED ORDER — LOSARTAN POTASSIUM 50 MG PO TABS
100.0000 mg | ORAL_TABLET | Freq: Every day | ORAL | Status: DC
  Administered 2024-02-07: 100 mg via ORAL
  Filled 2024-02-07 (×2): qty 2

## 2024-02-07 MED ORDER — LIDOCAINE-EPINEPHRINE-TETRACAINE (LET) TOPICAL GEL: 3.0000 mL | Freq: Once | TOPICAL | Status: DC

## 2024-02-07 NOTE — Progress Notes (Signed)
 EEG complete, results are pending.

## 2024-02-07 NOTE — Assessment & Plan Note (Signed)
 Discussed options.  A1c 6.3.   I would hold glimepiride  for now.  If sugar is significantly higher, then I would restart.  D/w pt.    Plan on recheck in 3 months.  Please update me about sugar and BP in about 10 days.

## 2024-02-07 NOTE — ED Notes (Signed)
 Ortho tech enroute to bedside.

## 2024-02-07 NOTE — Consult Note (Signed)
 Patient ID: Dennis Wise MRN: [id number] DOB/AGE: 75-18-1952 75 y.o.  Admit date: 02/07/2024  Admission Diagnoses:  Principal Problem:   Closed left hip fracture (HCC)   HPI: Ortho consult for left oleocranon and left basicervical/IT proximal femur fx sustained on 02/06/24 in syncopal episode.  PMH notable for T2DM, MGUS, CVA, HTN, ongoing seizure disorder. On plavix , last dose Sat AM per ER.  Of note, he recently had C2-T2 fusion with persistent pain in Sept 2025 at outside facility.  Denies new numbness/tingling.  Past Medical History: Past Medical History:  Diagnosis Date   Actinic keratosis 10/16/2013   Bilateral carotid artery stenosis 09/23/2021   Less than 50% bilaterally 2023   Cervical stenosis of spinal canal    Combined form of senile cataract of both eyes 05/02/2016   Diabetic retinopathy 03/21/2005   Dupuytren contracture 03/03/2019   Essential hypertension 11/20/1999   Last Assessment & Plan: Change to 5mg  ramipril  and see if the lightheaded troubles get better.  He'll update me as needed.   [name]    Generalized anxiety disorder 10/01/2022   GERD (gastroesophageal reflux disease) 01/1989   Hearing loss 10/20/2014   Helicobacter pylori gastritis 06/11/2011   On EGD 05/2011    Hemorrhagic disorder due to circulating anticoagulants 09/27/2022   History of colonic polyps 06/05/2012   06/05/2011 - 12 polyps max 7 mm, at least 9 adenomas  04/07/2013 - no polyps - repeat colonoscopy 2019 Dennis CHARLENA Commander, MD, [facility]        IMO SNOMED Dx Update Oct 2024     Hyperlipidemia    Internal hemorrhoids    Iron  deficiency anemia, unspecified 03/04/2011   Ischemic right PCA stroke 09/05/2022   Left homonymous hemianopsia 08/2022   Caused by right PCA stroke   MGUS (monoclonal gammopathy of unknown significance)    possible dx initially, had negative f/u   Mild vascular neurocognitive disorder 05/07/2023   Monoclonal B-cell lymphocytosis 01/15/2022   NAFLD  (nonalcoholic fatty liver disease)    Nocturia 08/18/2021   Normocytic anemia 04/03/2021   Obesity (BMI 30-39.9) 10/16/2013   Pain in joint of left knee 09/27/2022   Pancytopenia, acquired 05/01/2011   Pneumonia    Pseudophakia, both eyes 07/17/2016   Pure hypercholesterolemia 04/21/1988   Last Assessment & Plan: Continue statin, continue work on diet, d/w pt about labs.  He agrees.   Shoulder pain 07/02/2022   Syncope 11/22/2021   Thrombocytopenia 05/03/2021   Tibial fracture 10/24/2010   L tibia.  Repair 2012.    L tibia.  Repair 2012.  Last Assessment & Plan:  Per ortho.     Type II diabetes mellitus 04/21/1988   With h/o dec in sensation on feet     Unstable ankle 09/12/2020    Surgical History: Past Surgical History:  Procedure Laterality Date   CATARACT EXTRACTION Bilateral    COLONOSCOPY     ESOPHAGOGASTRODUODENOSCOPY     2012 H. pylori gastritis   FLEXIBLE SIGMOIDOSCOPY  05/1988   internal hemorrhoids   KNEE SURGERY  12/2002   lt knee fx repair ORIF   TIBIA FRACTURE SURGERY     left 2012    Family History: Family History  Problem Relation Age of Onset   Cancer Mother        ?   Diabetes Mother    Heart failure Mother    Emphysema Father    Cancer Father        ?   Alzheimer's disease Father  Dementia Father    Skin cancer Brother    Diabetes Brother    Colon cancer Neg Hx    Prostate cancer Neg Hx    Esophageal cancer Neg Hx    Rectal cancer Neg Hx    Stomach cancer Neg Hx     Social History: Social History   Socioeconomic History   Marital status: Married    Spouse name: [name]   Number of children: 1   Years of education: 12   Highest education level: 12th grade  Occupational History   Occupation: Audio engineer-retired  Tobacco Use   Smoking status: Never   Smokeless tobacco: Never  Vaping Use   Vaping status: Never Used  Substance and Sexual Activity   Alcohol use: Never   Drug use: No   Sexual activity: Yes    Birth  control/protection: None  Other Topics Concern   Not on file  Social History Narrative   Prev traveling for sports broadcasting    Working for [employer], [employer] etc as of 2019   Married 1970   1 child   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   Right handed   One floor home   Drinks caffeine   Lives with wife   Social Drivers of Health   Financial Resource Strain: Low Risk  (02/03/2024)   Overall Financial Resource Strain (CARDIA)    Difficulty of Paying Living Expenses: Not hard at all  Food Insecurity: No Food Insecurity (02/03/2024)   Hunger Vital Sign    Worried About Running Out of Food in the Last Year: Never true    Ran Out of Food in the Last Year: Never true  Transportation Needs: No Transportation Needs (02/03/2024)   PRAPARE - Administrator, Civil Service (Medical): No    Lack of Transportation (Non-Medical): No  Physical Activity: Inactive (02/03/2024)   Exercise Vital Sign    Days of Exercise per Week: 0 days    Minutes of Exercise per Session: Not on file  Stress: No Stress Concern Present (02/03/2024)   [employer] of Occupational Health - Occupational Stress Questionnaire    Feeling of Stress: Not at all  Social Connections: Socially Integrated (02/03/2024)   Social Connection and Isolation Panel    Frequency of Communication with Friends and Family: Three times a week    Frequency of Social Gatherings with Friends and Family: Once a week    Attends Religious Services: More than 4 times per year    Active Member of [employer] or Organizations: Yes    Attends Engineer, structural: More than 4 times per year    Marital Status: Married  Catering manager Violence: Not At Risk (05/05/2023)   Humiliation, Afraid, Rape, and Kick questionnaire    Fear of Current or Ex-Partner: No    Emotionally Abused: No    Physically Abused: No    Sexually Abused: No    Allergies: Hydralazine  and Promethazine hcl  Medications: I have reviewed the patient's current  medications.  Vital Signs: Patient Vitals for the past 24 hrs:  BP Temp Temp src Pulse Resp SpO2 Height Weight  02/07/24 1100 (!) 158/58 -- -- 67 16 100 % -- --  02/07/24 1031 (!) 149/54 -- -- 67 14 98 % -- --  02/07/24 1015 (!) 162/143 -- -- 66 17 98 % -- --  02/07/24 1000 (!) 151/53 -- -- 65 12 99 % -- --  02/07/24 0945 (!) 161/56 -- -- 68 14 99 % -- --  02/07/24 0930 (!) 160/55 -- -- 68 16 98 % -- --  02/07/24 0915 (!) 143/53 -- -- 63 14 100 % -- --  02/07/24 0900 (!) 119/49 -- -- (!) 57 16 99 % -- --  02/07/24 0815 (!) 124/47 -- -- 62 12 96 % -- --  02/07/24 0730 (!) 141/52 -- -- 60 17 95 % -- --  02/07/24 0715 (!) 154/96 -- -- 61 11 100 % -- --  02/07/24 0645 (!) 132/54 -- -- 65 18 95 % -- --  02/07/24 0628 (!) 155/53 -- -- 64 14 97 % -- --  02/07/24 0622 -- -- -- -- -- -- 6' 2 (1.88 m) 105.7 kg  02/07/24 0620 -- -- -- -- -- 93 % -- --  02/07/24 0550 -- -- -- (!) 59 15 99 % -- --  02/07/24 0549 -- -- -- -- -- 98 % -- --  02/07/24 0545 (!) 160/58 97.8 F (36.6 C) Oral 66 15 100 % -- --    Radiology: DG Elbow 2 Views Left Result Date: 02/07/2024 EXAM: 1 or 2 VIEW(S) XRAY OF THE LEFT ELBOW COMPARISON: Left elbow series earlier today. CLINICAL HISTORY: Trauma following fall. Previous x-rays taken of left elbow, better lateral view was needed. AP and oblique views were acceptable. Only one lateral image sent (shot cross table), best image obtainable due to patient unable to externally rotate wrist into lateral position. FINDINGS: BONES AND JOINTS: A single lateral projection of the left elbow redemonstrates the findings described previously, with again noted comminuted posterior proximal intraarticular ulnar fracture with slight distraction of the largest fragment, joint effusion with fat pad signs, and dorsal soft tissue swelling. Endosteal scalloping is also again noted in the shafts of the long bones. Osteopenia. No joint dislocation. SOFT TISSUES: Dorsal soft tissue swelling is  noted. There are patchy calcifications in the right ulnar and radial arteries in the forearm. IMPRESSION: 1. Comminuted posterior proximal intraarticular ulnar fracture with slight distraction of the largest fragment, with associated joint effusion and dorsal soft tissue swelling, as previously described. 2. Osteopenia and endosteal scalloping. Electronically signed by: Francis Quam MD 02/07/2024 07:37 AM EDT RP Workstation: [id number]   CT Hip Left Wo Contrast Result Date: 02/07/2024 EXAM: CT OF THE LEFT HIP WITHOUT IV CONTRAST 02/07/2024 06:20:00 AM TECHNIQUE: CT of the left hip was performed without the administration of intravenous contrast. Multiplanar reformatted images are provided for review. Automated exposure control, iterative reconstruction, and/or weight based adjustment of the mA/kV was utilized to reduce the radiation dose to as low as reasonably achievable. COMPARISON: None available. CLINICAL HISTORY: Hip trauma, fracture suspected, xray done. Chief complaints; Fall on Thinners; Hip trauma, Fracture suspected, Xray done ; TRAUMA FINDINGS: BONES: There is a comminuted fracture of the left femoral neck and intertrochanteric region. There is mild varus angulation. The left hemipelvis is intact. No aggressive appearing osseous abnormality or periostitis. SOFT TISSUE: No significant soft tissue edema or fluid collections. No soft tissue mass. JOINT: There is minimal hemarthrosis. No significant degenerative changes. No osseous erosions. VASCULATURE: There are moderate vascular calcifications. INTRAPELVIC CONTENTS: Limited images of the intrapelvic contents are unremarkable. IMPRESSION: 1. Comminuted fracture of the left femoral neck and intertrochanteric region with mild varus angulation. 2. Minimal hemarthrosis. Electronically signed by: Evalene Coho MD 02/07/2024 07:25 AM EDT RP Workstation: [id number]   CT Maxillofacial Wo Contrast Result Date: 02/07/2024 EXAM: CT OF THE FACE WITHOUT  CONTRAST 02/07/2024 06:37:00 AM TECHNIQUE: CT of the face was performed without the  administration of intravenous contrast. Multiplanar reformatted images are provided for review. Automated exposure control, iterative reconstruction, and/or weight based adjustment of the mA/kV was utilized to reduce the radiation dose to as low as reasonably achievable. COMPARISON: Maxillofacial CT dated 11/19/2021. CLINICAL HISTORY: Polytrauma, blunt. Chief complaints; Fall on Thinners; Polytrauma, Blunt; TRAUMA. FINDINGS: FACIAL BONES: No acute facial fracture. No mandibular dislocation. No suspicious bone lesion. ORBITS: Globes are intact. No acute traumatic injury. No inflammatory change. SINUSES AND MASTOIDS: There is mild circumferential mucosal disease within the floors of the maxillary sinuses bilaterally. SOFT TISSUES: No acute abnormality. IMPRESSION: 1. No acute facial fracture. 2. Mild circumferential mucosal disease within the floors of the maxillary sinuses bilaterally. Electronically signed by: Evalene Coho MD 02/07/2024 07:21 AM EDT RP Workstation: [id number]   CT Cervical Spine Wo Contrast Result Date: 02/07/2024 EXAM: CT CERVICAL SPINE WITHOUT CONTRAST 02/07/2024 06:37:00 AM TECHNIQUE: CT of the cervical spine was performed without the administration of intravenous contrast. Multiplanar reformatted images are provided for review. Automated exposure control, iterative reconstruction, and/or weight based adjustment of the mA/kV was utilized to reduce the radiation dose to as low as reasonably achievable. COMPARISON: CT of the cervical spine dated 11/19/2021. CLINICAL HISTORY: Polytrauma, blunt. Chief complaints; Fall on Thinners; Polytrauma, Blunt; TRAUMA FINDINGS: CERVICAL SPINE: BONES AND ALIGNMENT: No acute fracture or traumatic malalignment. The patient is status post interval decompression laminectomies C3-C6 and status post interval bilateral posterolateral spinal fusion extending from C2-T2.  DEGENERATIVE CHANGES: There is mild-to-moderate diffuse chronic degenerative disc disease throughout the cervical spine. SOFT TISSUES: There is mild-to-moderate calcification within the carotid bulbs. No prevertebral soft tissue swelling. IMPRESSION: 1. No acute traumatic injury. 2. Status post interval decompression laminectomies C3 through C6 and bilateral posterolateral spinal fusion extending from C2 through T2. 3. Mild-to-moderate diffuse chronic degenerative disc disease throughout the cervical spine. Electronically signed by: Evalene Coho MD 02/07/2024 07:19 AM EDT RP Workstation: [id number]   CT Head Wo Contrast Result Date: 02/07/2024 EXAM: CT HEAD WITHOUT CONTRAST 02/07/2024 06:35:00 AM TECHNIQUE: CT of the head was performed without the administration of intravenous contrast. Automated exposure control, iterative reconstruction, and/or weight based adjustment of the mA/kV was utilized to reduce the radiation dose to as low as reasonably achievable. COMPARISON: CT angiogram of the head and neck dated 09/05/2022. CLINICAL HISTORY: Polytrauma, blunt. Chief complaints; Fall on Thinners; CT Head Wo Contrast; Polytrauma, Blunt; TRAUMA FINDINGS: BRAIN AND VENTRICLES: No acute hemorrhage. No evidence of acute infarct. 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 Atrophy and chronic small vessel disease. No hydrocephalus. No extra-axial collection. No mass effect or midline shift. ORBITS: No acute abnormality. SINUSES: No acute abnormality. SOFT TISSUES AND SKULL: No acute soft tissue abnormality. No skull fracture. IMPRESSION: 1. No acute intracranial abnormality. 2. 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 3. Atrophy and chronic small vessel disease. Electronically signed by: Evalene Coho MD 02/07/2024 07:15 AM EDT RP Workstation: [id number]   DG Chest Portable 1  View Result Date: 02/07/2024 EXAM: 1 VIEW XRAY OF THE CHEST 02/07/2024 06:09:18 AM COMPARISON: PA lateral chest 04/24/2023. CLINICAL HISTORY: Fall etc. Trauma level 2/ fall on thinners; syncopal episode while standing from his computer. FINDINGS: LUNGS AND PLEURA: No focal pulmonary opacity. No pulmonary edema. No pleural effusion. No pneumothorax. HEART AND MEDIASTINUM: Mild cardiomegaly. Stable mediastinum with aortic atherosclerosis. BONES AND SOFT TISSUES: Dorsal fusion hardware is partially visible at the upper 2 thoracic spine  levels. There is thoracic spondylosis. Moderate bilateral shoulder arthrosis and osteopenia. IMPRESSION: 1. No acute cardiopulmonary process. 2. Stable chest with Mild cardiomegaly. 3. Osteopenia and degenerative change. Electronically signed by: Francis Quam MD 02/07/2024 06:37 AM EDT RP Workstation: [id number]   DG Pelvis Portable Result Date: 02/07/2024 EXAM: 1 or 2 VIEW(S) XRAY OF THE PELVIS 02/07/2024 06:10:07 AM COMPARISON: Chest, abdomen, and pelvis CT 03/19/2022. CLINICAL HISTORY: Fall etc. Trauma level 2/ fall on thinners; syncopal episode while standing from his computer. FINDINGS: BONES AND JOINTS: There is an acute transverse oblique basicervical proximal left femoral fracture with impaction at the fracture site and a mild varus angulation. There is osteopenia without other evidence for fractures including the bony pelvis and proximal right femur. No focal osseous lesion. No joint dislocation. There is mild arthrosis at the SI joints, both hips, and enthesopathic change of the pelvis. Advanced degenerative change in the visualized lower lumbar spine. SOFT TISSUES: There is extensive iliofemoral calcific arteriosclerosis. IMPRESSION: 1. Acute transverse oblique basicervical proximal left femoral fracture with impaction and mild varus angulation. 2. Advanced degenerative change in the visualized lower lumbar spine. 3. Extensive iliofemoral calcific arteriosclerosis.  Electronically signed by: Francis Quam MD 02/07/2024 06:35 AM EDT RP Workstation: [id number]   DG Elbow Complete Left Result Date: 02/07/2024 EXAM: 3 VIEW(S) XRAY OF THE LEFT ELBOW COMPARISON: None available. CLINICAL HISTORY: Fall on thinners; syncopal episode while standing from his computer. FINDINGS: BONES AND JOINTS: Osteopenia. There is an acute oblique, minimally distracted and otherwise nondisplaced intraarticular comminuted fracture of the posterior aspect of the proximal ulna. There are no further displaced fractures. There is endosteal scalloping in the distal humerus and in the visualized portions of the radius and ulna. In this person's age group this may be seen with multiple myeloma, anemias, skeletal amyloidosis, and bone metastases. It is possible this could be a pathologic fracture, but the endosteal scalloping for the most part appears limited to the shafts of these bones. Clinical correlation is needed. Positive for displaced fat pad signs consistent with joint effusion. No joint dislocation. SOFT TISSUES: Moderate dorsal soft tissue swelling extends over the upper forearm. IMPRESSION: 1. Acute oblique, minimally distracted, otherwise nondisplaced intraarticular comminuted fracture of the posterior aspect of the proximal ulna. 2. Joint effusion with displaced fat pad signs. 3. Moderate dorsal soft tissue swelling extending over the upper forearm. 4. Osteopenia with endosteal scalloping in the distal humerus and visualized portions of the radius and ulna; concerning for potential pathologic process such as plasma cell dyscrasia, skeletal amyloidosis or metastatic disease; recommend appropriate hematologic/oncologic evaluation and follow-up imaging as indicated. 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 Electronically signed by: Francis Quam MD 02/07/2024 06:30 AM EDT RP Workstation: [id number]    Labs: Recent Labs    02/07/24 0542 02/07/24 0546  WBC 6.0   --   RBC 3.14*  --   HCT 28.0* 27.0*  PLT 102*  --    Recent Labs    02/07/24 0546  NA 136  K 3.8  CL 100  BUN 41*  CREATININE 1.70*  GLUCOSE 249*   No results for input(s): LABPT, INR in the last 72 hours.  Review of Systems: ROS as detailed in HPI  Physical Exam: Body mass index is 29.92 kg/m.  Physical Exam   Gen: AAOx3, NAD Comfortable at rest  Left Lower Extremity: Skin intact TTP over proximal femur ADF/APF/EHL 5/5 SILT throughout DP, PT 2+ to palp CR < 2s  Gen:  AAOx3, NAD Comfortable at rest  Left Upper Extremity: Splint in place TTP over elbow AIN/PIN/Ulnar intact SILT throughout Radial, Ulnar + to palp CR < 2s    Assessment and Plan: Ortho consult for left basicervical impacted shortened femoral neck fracture with displaced left oleocranon fracture sustained from syncopal fall   -history, exam and imaging reviewed at length with patient/wife at bedside -admitted to hospitalist team -he will need left hip ORIF and left elbow ORIF, need to coordinate timing with Ortho Trauma colleagues who have kindly agreed to help manage, for now Monday 10/20 - will update once determined -imaging poses concern for pathologic fx process with suspected multiple myeloma in setting of known MGUS. Workup per primary team/oncology -TDWB LLE and NWB LUE in splint and sling -PT/OT postop -Hold VTE ppx and keep NPO from MN  Lillia Mountain, MD Orthopaedic Surgeon [facility] [phone]  The risks and benefits were presented and reviewed. The risks due to hardware failure/irritation, new/persistent/recurrent infection, stiffness, nerve/vessel/tendon injury, nonunion/malunion of any fracture, wound healing issues, allograft usage, development of arthritis, failure of this surgery, possibility of external fixation in certain situations, possibility of delayed definitive surgery, need for further surgery, prolonged wound care including further soft tissue  coverage procedures, thromboembolic events, anesthesia/medical complications/events perioperatively and beyond, amputation, death among others were discussed. The patient acknowledged the explanation, agreed to proceed with the plan.

## 2024-02-07 NOTE — H&P (Signed)
 History and Physical    Patient: Dennis Wise FMW:[id number] DOB: [birth year] DOA: 02/07/2024 DOS: the patient was seen and examined on 02/07/2024 PCP: [name], MD  Patient coming from: Home  Chief Complaint:  Chief Complaint  Patient presents with   Fall on Thinners   HPI: Dennis Wise is a 73 y.o. male with medical history significant of MGUS, NAFLD, ischemic R PCA stroke in 2024, HTN, and DM2 p/w GLF c/b L hip and L elbow fractures iso syncope c/b AKI.  The patient had undergone neck surgery five weeks prior and had been using a walker and doing physical therapy at home. On Saturday afternoon, the patient was sitting at a computer desk and was preparing to attend a Halloween party at his brother's house. He attempted to stand up to take a shower but fell after taking a couple of steps. The patient did not recall the fall and reported not feeling dizzy before the incident. His wife, who was not in the room at the time, described that their son was called to assist in getting the patient back into a chair. Initially, the patient experienced some pain, but it was not severe, and he was able to eat a snack and dinner. However, the pain worsened overnight, prompting his wife to call 911 at four o'clock in the morning. The patient identified the hip and leg as the most painful areas, with muscle twitching in the top of his leg exacerbating the discomfort. The patient had a history of restless leg syndrome, which contributed to his inability to stand or put weight on the leg. The patient also reported hitting his head over his left eye, likely on a doorframe.  In the ED, pt AFVSS. Labs notable for Cr 1.70 (baseline Cr 1.3). CXR w/ osteopenia. CT L hip showed comminuted fracture of the left femoral neck and intertrochanteric region with mild varus angulation. XR L elbow showed comminuted posterior proximal intraarticular ulnar fracture. EDP consuited orthopedic surgery and requested medicine  admission.   Review of Systems: As mentioned in the history of present illness. All other systems reviewed and are negative. Past Medical History:  Diagnosis Date   Actinic keratosis 10/16/2013   Bilateral carotid artery stenosis 09/23/2021   Less than 50% bilaterally 2023   Cervical stenosis of spinal canal    Combined form of senile cataract of both eyes 05/02/2016   Diabetic retinopathy 03/21/2005   Dupuytren contracture 03/03/2019   Essential hypertension 11/20/1999   Last Assessment & Plan: Change to 5mg  ramipril  and see if the lightheaded troubles get better.  He'll update me as needed.   [name]    Generalized anxiety disorder 10/01/2022   GERD (gastroesophageal reflux disease) 01/1989   Hearing loss 10/20/2014   Helicobacter pylori gastritis 06/11/2011   On EGD 05/2011    Hemorrhagic disorder due to circulating anticoagulants 09/27/2022   History of colonic polyps 06/05/2012   06/05/2011 - 12 polyps max 7 mm, at least 9 adenomas  04/07/2013 - no polyps - repeat colonoscopy 2019 [name], MD, [facility]        IMO SNOMED Dx Update Oct 2024     Hyperlipidemia    Internal hemorrhoids    Iron  deficiency anemia, unspecified 03/04/2011   Ischemic right PCA stroke 09/05/2022   Left homonymous hemianopsia 08/2022   Caused by right PCA stroke   MGUS (monoclonal gammopathy of unknown significance)    possible dx initially, had negative f/u   Mild vascular neurocognitive  disorder 05/07/2023   Monoclonal B-cell lymphocytosis 01/15/2022   NAFLD (nonalcoholic fatty liver disease)    Nocturia 08/18/2021   Normocytic anemia 04/03/2021   Obesity (BMI 30-39.9) 10/16/2013   Pain in joint of left knee 09/27/2022   Pancytopenia, acquired 05/01/2011   Pneumonia    Pseudophakia, both eyes 07/17/2016   Pure hypercholesterolemia 04/21/1988   Last Assessment & Plan: Continue statin, continue work on diet, d/w pt about labs.  He agrees.   Shoulder pain 07/02/2022   Syncope 11/22/2021    Thrombocytopenia 05/03/2021   Tibial fracture 10/24/2010   L tibia.  Repair 2012.    L tibia.  Repair 2012.  Last Assessment & Plan:  Per ortho.     Type II diabetes mellitus 04/21/1988   With h/o dec in sensation on feet     Unstable ankle 09/12/2020   Past Surgical History:  Procedure Laterality Date   CATARACT EXTRACTION Bilateral    COLONOSCOPY     ESOPHAGOGASTRODUODENOSCOPY     2012 H. pylori gastritis   FLEXIBLE SIGMOIDOSCOPY  05/1988   internal hemorrhoids   KNEE SURGERY  12/2002   lt knee fx repair ORIF   TIBIA FRACTURE SURGERY     left 2012   Social History:  reports that he has never smoked. He has never used smokeless tobacco. He reports that he does not drink alcohol and does not use drugs.  Allergies  Allergen Reactions   Hydralazine      Short of breath.     Promethazine Hcl     REACTION: AGITIATION    Family History  Problem Relation Age of Onset   Cancer Mother        ?   Diabetes Mother    Heart failure Mother    Emphysema Father    Cancer Father        ?   Alzheimer's disease Father    Dementia Father    Skin cancer Brother    Diabetes Brother    Colon cancer Neg Hx    Prostate cancer Neg Hx    Esophageal cancer Neg Hx    Rectal cancer Neg Hx    Stomach cancer Neg Hx     Prior to Admission medications   Medication Sig Start Date End Date Taking? Authorizing Provider  acetaminophen  (TYLENOL ) 325 MG tablet Take 2 tablets (650 mg total) by mouth every 4 (four) hours as needed for mild pain (or temp > 37.5 C (99.5 F)). 09/07/22   Sheikh, Omair Latif, DO  atorvastatin  (LIPITOR) 10 MG tablet Take 1 tablet (10 mg total) by mouth at bedtime. 02/26/23   [name], MD  clopidogrel  (PLAVIX ) 75 MG tablet TAKE 1 TABLET EVERY DAY 09/17/23   [name], MD  escitalopram  (LEXAPRO ) 10 MG tablet Take 1 tablet (10 mg total) by mouth daily. 09/09/23   Saucier, Dorn Ruth, NP  glimepiride  (AMARYL ) 2 MG tablet Held as of 02/04/24 02/04/24   [name], MD  hydrochlorothiazide  (HYDRODIURIL ) 25 MG tablet Take 0.5 tablets (12.5 mg total) by mouth daily. 02/04/24   [name], MD  IRON -VITAMIN C PO Take by mouth daily. 60 mg tablet    [provider]  losartan  (COZAAR ) 100 MG tablet TAKE 1 TABLET EVERY DAY 09/17/23   [name], MD  memantine  (NAMENDA ) 5 MG tablet Take 1 tablet (5 mg total) by mouth 2 (two) times daily. 03/23/23   Wertman, Sara E, PA-C  metFORMIN  (GLUCOPHAGE ) 850 MG tablet  Take 1 tablet (850 mg total) by mouth 2 (two) times daily. 02/26/23   [name], MD  Multiple Vitamin (MULTIVITAMIN) tablet Take 1 tablet by mouth daily. Centrum Silver    [provider]  niacin  (NIASPAN ) 500 MG CR tablet Take 3 tablets (1,500 mg total) by mouth daily. 08/15/21   [name], MD  pioglitazone  (ACTOS ) 45 MG tablet Take 1 tablet (45 mg total) by mouth daily. 02/26/23   [name], MD    Physical Exam: Vitals:   02/07/24 0645 02/07/24 0715 02/07/24 0730 02/07/24 0815  BP: (!) 132/54 (!) 154/96 (!) 141/52 (!) 124/47  Pulse: 65 61 60 62  Resp: 18 11 17 12   Temp:      TempSrc:      SpO2: 95% 100% 95% 96%  Weight:      Height:       General: Alert, oriented x3, resting comfortably in no acute distress Respiratory: Lungs clear to auscultation bilaterally with normal respiratory effort; no w/r/r Cardiovascular: Regular rate and rhythm w/o m/r/g   Data Reviewed:  Lab Results  Component Value Date   WBC 6.0 02/07/2024   HGB 9.2 (L) 02/07/2024   HCT 27.0 (L) 02/07/2024   MCV 89.2 02/07/2024   PLT 102 (L) 02/07/2024   Lab Results  Component Value Date   GLUCOSE 249 (H) 02/07/2024   CALCIUM  9.1 02/04/2024   NA 136 02/07/2024   K 3.8 02/07/2024   CO2 26 02/04/2024   CL 100 02/07/2024   BUN 41 (H) 02/07/2024   CREATININE 1.70 (H) 02/07/2024   Lab Results  Component Value Date   ALT 11 02/04/2024   AST 17 02/04/2024   GGT 43 06/26/2021   ALKPHOS 131 (H) 02/04/2024   BILITOT  0.8 02/04/2024   Lab Results  Component Value Date   INR 1.0 02/11/2023   INR 1.0 09/05/2022   Radiology: DG Elbow 2 Views Left Result Date: 02/07/2024 EXAM: 1 or 2 VIEW(S) XRAY OF THE LEFT ELBOW COMPARISON: Left elbow series earlier today. CLINICAL HISTORY: Trauma following fall. Previous x-rays taken of left elbow, better lateral view was needed. AP and oblique views were acceptable. Only one lateral image sent (shot cross table), best image obtainable due to patient unable to externally rotate wrist into lateral position. FINDINGS: BONES AND JOINTS: A single lateral projection of the left elbow redemonstrates the findings described previously, with again noted comminuted posterior proximal intraarticular ulnar fracture with slight distraction of the largest fragment, joint effusion with fat pad signs, and dorsal soft tissue swelling. Endosteal scalloping is also again noted in the shafts of the long bones. Osteopenia. No joint dislocation. SOFT TISSUES: Dorsal soft tissue swelling is noted. There are patchy calcifications in the right ulnar and radial arteries in the forearm. IMPRESSION: 1. Comminuted posterior proximal intraarticular ulnar fracture with slight distraction of the largest fragment, with associated joint effusion and dorsal soft tissue swelling, as previously described. 2. Osteopenia and endosteal scalloping. Electronically signed by: Francis Quam MD 02/07/2024 07:37 AM EDT RP Workstation: [id number]   CT Hip Left Wo Contrast Result Date: 02/07/2024 EXAM: CT OF THE LEFT HIP WITHOUT IV CONTRAST 02/07/2024 06:20:00 AM TECHNIQUE: CT of the left hip was performed without the administration of intravenous contrast. Multiplanar reformatted images are provided for review. Automated exposure control, iterative reconstruction, and/or weight based adjustment of the mA/kV was utilized to reduce the radiation dose to as low as reasonably achievable. COMPARISON: None available. CLINICAL HISTORY:  Hip trauma,  fracture suspected, xray done. Chief complaints; Fall on Thinners; Hip trauma, Fracture suspected, Xray done ; TRAUMA FINDINGS: BONES: There is a comminuted fracture of the left femoral neck and intertrochanteric region. There is mild varus angulation. The left hemipelvis is intact. No aggressive appearing osseous abnormality or periostitis. SOFT TISSUE: No significant soft tissue edema or fluid collections. No soft tissue mass. JOINT: There is minimal hemarthrosis. No significant degenerative changes. No osseous erosions. VASCULATURE: There are moderate vascular calcifications. INTRAPELVIC CONTENTS: Limited images of the intrapelvic contents are unremarkable. IMPRESSION: 1. Comminuted fracture of the left femoral neck and intertrochanteric region with mild varus angulation. 2. Minimal hemarthrosis. Electronically signed by: Evalene Coho MD 02/07/2024 07:25 AM EDT RP Workstation: [id number]   CT Maxillofacial Wo Contrast Result Date: 02/07/2024 EXAM: CT OF THE FACE WITHOUT CONTRAST 02/07/2024 06:37:00 AM TECHNIQUE: CT of the face was performed without the administration of intravenous contrast. Multiplanar reformatted images are provided for review. Automated exposure control, iterative reconstruction, and/or weight based adjustment of the mA/kV was utilized to reduce the radiation dose to as low as reasonably achievable. COMPARISON: Maxillofacial CT dated 11/19/2021. CLINICAL HISTORY: Polytrauma, blunt. Chief complaints; Fall on Thinners; Polytrauma, Blunt; TRAUMA. FINDINGS: FACIAL BONES: No acute facial fracture. No mandibular dislocation. No suspicious bone lesion. ORBITS: Globes are intact. No acute traumatic injury. No inflammatory change. SINUSES AND MASTOIDS: There is mild circumferential mucosal disease within the floors of the maxillary sinuses bilaterally. SOFT TISSUES: No acute abnormality. IMPRESSION: 1. No acute facial fracture. 2. Mild circumferential mucosal disease within the  floors of the maxillary sinuses bilaterally. Electronically signed by: Evalene Coho MD 02/07/2024 07:21 AM EDT RP Workstation: [id number]   CT Cervical Spine Wo Contrast Result Date: 02/07/2024 EXAM: CT CERVICAL SPINE WITHOUT CONTRAST 02/07/2024 06:37:00 AM TECHNIQUE: CT of the cervical spine was performed without the administration of intravenous contrast. Multiplanar reformatted images are provided for review. Automated exposure control, iterative reconstruction, and/or weight based adjustment of the mA/kV was utilized to reduce the radiation dose to as low as reasonably achievable. COMPARISON: CT of the cervical spine dated 11/19/2021. CLINICAL HISTORY: Polytrauma, blunt. Chief complaints; Fall on Thinners; Polytrauma, Blunt; TRAUMA FINDINGS: CERVICAL SPINE: BONES AND ALIGNMENT: No acute fracture or traumatic malalignment. The patient is status post interval decompression laminectomies C3-C6 and status post interval bilateral posterolateral spinal fusion extending from C2-T2. DEGENERATIVE CHANGES: There is mild-to-moderate diffuse chronic degenerative disc disease throughout the cervical spine. SOFT TISSUES: There is mild-to-moderate calcification within the carotid bulbs. No prevertebral soft tissue swelling. IMPRESSION: 1. No acute traumatic injury. 2. Status post interval decompression laminectomies C3 through C6 and bilateral posterolateral spinal fusion extending from C2 through T2. 3. Mild-to-moderate diffuse chronic degenerative disc disease throughout the cervical spine. Electronically signed by: Evalene Coho MD 02/07/2024 07:19 AM EDT RP Workstation: [id number]   CT Head Wo Contrast Result Date: 02/07/2024 EXAM: CT HEAD WITHOUT CONTRAST 02/07/2024 06:35:00 AM TECHNIQUE: CT of the head was performed without the administration of intravenous contrast. Automated exposure control, iterative reconstruction, and/or weight based adjustment of the mA/kV was utilized to reduce the radiation dose  to as low as reasonably achievable. COMPARISON: CT angiogram of the head and neck dated 09/05/2022. CLINICAL HISTORY: Polytrauma, blunt. Chief complaints; Fall on Thinners; CT Head Wo Contrast; Polytrauma, Blunt; TRAUMA FINDINGS: BRAIN AND VENTRICLES: No acute hemorrhage. No evidence of acute infarct. 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 Atrophy and chronic small vessel disease. No hydrocephalus. No extra-axial collection. No  mass effect or midline shift. ORBITS: No acute abnormality. SINUSES: No acute abnormality. SOFT TISSUES AND SKULL: No acute soft tissue abnormality. No skull fracture. IMPRESSION: 1. No acute intracranial abnormality. 2. 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 3. Atrophy and chronic small vessel disease. Electronically signed by: Evalene Coho MD 02/07/2024 07:15 AM EDT RP Workstation: [id number]   DG Chest Portable 1 View Result Date: 02/07/2024 EXAM: 1 VIEW XRAY OF THE CHEST 02/07/2024 06:09:18 AM COMPARISON: PA lateral chest 04/24/2023. CLINICAL HISTORY: Fall etc. Trauma level 2/ fall on thinners; syncopal episode while standing from his computer. FINDINGS: LUNGS AND PLEURA: No focal pulmonary opacity. No pulmonary edema. No pleural effusion. No pneumothorax. HEART AND MEDIASTINUM: Mild cardiomegaly. Stable mediastinum with aortic atherosclerosis. BONES AND SOFT TISSUES: Dorsal fusion hardware is partially visible at the upper 2 thoracic spine levels. There is thoracic spondylosis. Moderate bilateral shoulder arthrosis and osteopenia. IMPRESSION: 1. No acute cardiopulmonary process. 2. Stable chest with Mild cardiomegaly. 3. Osteopenia and degenerative change. Electronically signed by: Francis Quam MD 02/07/2024 06:37 AM EDT RP Workstation: [id number]   DG Pelvis Portable Result Date: 02/07/2024 EXAM: 1 or 2 VIEW(S) XRAY OF THE PELVIS  02/07/2024 06:10:07 AM COMPARISON: Chest, abdomen, and pelvis CT 03/19/2022. CLINICAL HISTORY: Fall etc. Trauma level 2/ fall on thinners; syncopal episode while standing from his computer. FINDINGS: BONES AND JOINTS: There is an acute transverse oblique basicervical proximal left femoral fracture with impaction at the fracture site and a mild varus angulation. There is osteopenia without other evidence for fractures including the bony pelvis and proximal right femur. No focal osseous lesion. No joint dislocation. There is mild arthrosis at the SI joints, both hips, and enthesopathic change of the pelvis. Advanced degenerative change in the visualized lower lumbar spine. SOFT TISSUES: There is extensive iliofemoral calcific arteriosclerosis. IMPRESSION: 1. Acute transverse oblique basicervical proximal left femoral fracture with impaction and mild varus angulation. 2. Advanced degenerative change in the visualized lower lumbar spine. 3. Extensive iliofemoral calcific arteriosclerosis. Electronically signed by: Francis Quam MD 02/07/2024 06:35 AM EDT RP Workstation: [id number]   DG Elbow Complete Left Result Date: 02/07/2024 EXAM: 3 VIEW(S) XRAY OF THE LEFT ELBOW COMPARISON: None available. CLINICAL HISTORY: Fall on thinners; syncopal episode while standing from his computer. FINDINGS: BONES AND JOINTS: Osteopenia. There is an acute oblique, minimally distracted and otherwise nondisplaced intraarticular comminuted fracture of the posterior aspect of the proximal ulna. There are no further displaced fractures. There is endosteal scalloping in the distal humerus and in the visualized portions of the radius and ulna. In this person's age group this may be seen with multiple myeloma, anemias, skeletal amyloidosis, and bone metastases. It is possible this could be a pathologic fracture, but the endosteal scalloping for the most part appears limited to the shafts of these bones. Clinical correlation is needed.  Positive for displaced fat pad signs consistent with joint effusion. No joint dislocation. SOFT TISSUES: Moderate dorsal soft tissue swelling extends over the upper forearm. IMPRESSION: 1. Acute oblique, minimally distracted, otherwise nondisplaced intraarticular comminuted fracture of the posterior aspect of the proximal ulna. 2. Joint effusion with displaced fat pad signs. 3. Moderate dorsal soft tissue swelling extending over the upper forearm. 4. Osteopenia with endosteal scalloping in the distal humerus and visualized portions of the radius and ulna; concerning for potential pathologic process such as plasma cell dyscrasia, skeletal amyloidosis or metastatic disease; recommend appropriate hematologic/oncologic evaluation and follow-up imaging as indicated. According to reports  of prior studies, he does have a history of mgus. Electronically signed by: Francis Quam MD 02/07/2024 06:30 AM EDT RP Workstation: [id number]    Assessment and Plan: 65M h/o MGUS, NAFLD, ischemic R PCA stroke in 2024, HTN, and DM2 p/w GLF c/b L hip and L elbow fractures iso syncope c/b AKI.  AKI -MIVF: NS at 100cc/h for 24h -Strict I&Os and daily weights (standing preferred) -F/u BMP daily -Renally dose medications for CrCl -Avoid lovenox , NSAIDs, morphine, Fleet's phosphate enema, regular insulin , contrast; no gadolinium for MRI to avoid nephrogenic systemic fibrosis -Consider renal US  and nephrology consult if worsening AKI  L hip fracture L elbow fracture Fragility fracture Possible pathologic fracture give h/o MGUS -Orthopedic surgery consulted; apprec eval/recs -F/u 25-OH vitamin D and treat if low w/ ergochalciferol 50,000 IU weekly for 8-12 weeks; pt will need 25-OH vitamin D re-testing as outpt to ensure repletion in 8-12 weeks; may consider ergochalciferol 2000 IU daily after testing in 8-12 weeks for maintenance therapy -Consider bisphosphonate or Prolia at d/c and OP PCP f/u for DEXA scan -Multimodal pain  control with tylenol  TID, robaxin TID, gabapentin TID, and oxycodone 5mg  q4h prn  Syncope -PT/OT consulted; apprec eval/recs -Tele -MIVF: NS at 100cc/h for 24h -F/u orthostatic vitals -F/u admission blood cultures (low suspicion for bacteremia) -F/u EtOH level -F/u EEG; if abnl consult Neurology -F/u TTE to eval for structural/valvular heart disease -Consider 14d Holter monitor on discharge if no other obvious etiology  HTN -PTA losartan  100mg  daily and hydrochlorothiazide  12.5mg  daily  Mood disorder -PTA Lexapro    Advance Care Planning:   Code Status: Full Code   Consults: Orthopedic surgery  Family Communication: Wife  Severity of Illness: The appropriate patient status for this patient is INPATIENT. Inpatient status is judged to be reasonable and necessary in order to provide the required intensity of service to ensure the patient's safety. The patient's presenting symptoms, physical exam findings, and initial radiographic and laboratory data in the context of their chronic comorbidities is felt to place them at high risk for further clinical deterioration. Furthermore, it is not anticipated that the patient will be medically stable for discharge from the hospital within 2 midnights of admission.   * I certify that at the point of admission it is my clinical judgment that the patient will require inpatient hospital care spanning beyond 2 midnights from the point of admission due to high intensity of service, high risk for further deterioration and high frequency of surveillance required.*   ------- I spent 65 minutes reviewing previous notes, at the bedside counseling/discussing the treatment plan, and performing clinical documentation.  Author: Marsha Ada, MD 02/07/2024 8:49 AM  For on call review [email or web address].

## 2024-02-07 NOTE — ED Provider Notes (Signed)
  Physical Exam  BP (!) 155/53 (BP Location: Right Arm)   Pulse 64   Temp 97.8 F (36.6 C) (Oral)   Resp 14   Ht 6' 2 (1.88 m)   Wt 105.7 kg   SpO2 97%   BMI 29.92 kg/m   Physical Exam  Procedures  Procedures  ED Course / MDM    Medical Decision Making Amount and/or Complexity of Data Reviewed Labs: ordered. Radiology: ordered.  Risk Prescription drug management.   69M presenting after a syncopal episode. Has an elbow fracture. Cr up since April, Recent C2-T2 fracture. ? Issue with his hardware based on Ortho's read of the CT. Waiting on full radiology reads. CT elbow pending. Has a femur fracture.   CT Head: IMPRESSION:  1. No acute intracranial abnormality.  2. Old right temporo occipital infarct with encephalomalacia and ex vacuo  dilatation of right lateral ventricle.  3. Atrophy and chronic small vessel disease.    CT Cervical Spine:  IMPRESSION:  1. No acute traumatic injury.  2. Status post interval decompression laminectomies C3 through C6 and bilateral  posterolateral spinal fusion extending from C2 through T2.  3. Mild-to-moderate diffuse chronic degenerative disc disease throughout the  cervical spine.   CT Maxface: IMPRESSION:  1. No acute facial fracture.  2. Mild circumferential mucosal disease within the floors of the maxillary  sinuses bilaterally.     Consults: Dr. Romona and Stephen previously consulted by prior ER provider.  CT Lef Hip: IMPRESSION:  1. Comminuted fracture of the left femoral neck and intertrochanteric region  with mild varus angulation.  2. Minimal hemarthrosis.   CT Elbow pending at time of admission. Pt subsequently admitted to the hospitalist service in stable condition.       Jerrol Agent, MD 02/07/24 [phone]

## 2024-02-07 NOTE — ED Triage Notes (Addendum)
 Patient from home with complaints of a syncopal episode while standing from his computer at 230 pm yesterday. Patient hit is head on the floor. Patient states pain on the left side of the body including the left eye, left skin tear to elbow and left femur pain. Patient also states right hand pain. Patient had a recent C2-T2 fusion on his spine. Baseline left eye blindness from a prior CVA. EMS witnessed a seizure as the patient was moving from the wheelchair to the stretcher. Patient had a leftward gauze and tremors. EMS went to get medication from the truck for the seizure and the patient was back to normal. Patient on Plavix .  BGL 263 IV: 18 GG LAC

## 2024-02-07 NOTE — Care Plan (Addendum)
 Orthopaedic Surgery Plan of Care Note   -history and imaging reviewed with requesting team (ER) -pt has left basicervical impacted shortened femoral neck fracture with displaced left oleocranon fracture sustained from syncopal fall -PMH includes T2DM, MGUS, prior CVA, HTN among others. On Plavix  -admit to hospitalist team -he will need left hip ORIF and left elbow ORIF, need to coordinate timing, possibly Monday 10/20 - will update once determined -imaging poses concern for pathologic fx process with suspected multiple myeloma in setting of known MGUS -NSGY consult for C-spine -full consult note to follow   Lillia Mountain, MD Orthopaedic Surgery [facility]

## 2024-02-07 NOTE — Progress Notes (Signed)
 Orthopedic Tech Progress Note Patient Details:  Dennis Wise Sep 16, 1950 [id number]  Ortho Devices Type of Ortho Device: Long arm splint Ortho Device/Splint Interventions: Ordered, Application, Adjustment   Post Interventions Patient Tolerated: Poor Splint placed, patient unable to keep arm still due to pain. Sling left at bedside to be applied once CT of elbow is complete. Dennis Wise 02/07/2024, 9:52 AM

## 2024-02-07 NOTE — Procedures (Signed)
 Patient Name: Dennis Wise  MRN: [id number]  Epilepsy Attending: Arlin MALVA Krebs  Referring Physician/Provider: Georgina Basket, MD  Date: 02/07/2024 Duration: 29.14 mins  Patient history: 74yo M with syncope. EEG to evaluate for seizure  Level of alertness: Awake  AEDs during EEG study: GBP  Technical aspects: This EEG study was done with scalp electrodes positioned according to the 10-20 International system of electrode placement. Electrical activity was reviewed with band pass filter of 1-70Hz , sensitivity of 7 uV/mm, display speed of 40mm/sec with a 60Hz  notched filter applied as appropriate. EEG data were recorded continuously and digitally stored.  Video monitoring was available and reviewed as appropriate.  Description: The posterior dominant rhythm consists of 8 Hz activity of moderate voltage (25-35 uV) seen predominantly in posterior head regions, symmetric and reactive to eye opening and eye closing. EEG showed continuous 5 to 7 Hz theta slowing in right temporo-occipital region. Hyperventilation and photic stimulation were not performed.     ABNORMALITY - Continuous slow, right temporo-occipital region  IMPRESSION: This study is suggestive of cortical dysfunction arising from right temporo-occipital region likely secondary to underlying structural abnormality/ stroke. No seizures or epileptiform discharges were seen throughout the recording.  Adessa Primiano O Lenae Wherley

## 2024-02-07 NOTE — Assessment & Plan Note (Signed)
 Discussed options.  Reasonable to try cutting hydrochlorothiazide  in half and update me if BP is low or high or if any more weight loss.

## 2024-02-07 NOTE — ED Provider Notes (Signed)
 [place] EMERGENCY DEPARTMENT AT [facility] Provider Note   CSN: [id number] Arrival date & time: 02/07/24  0540     Patient presents with: Fall on Thinners   Dennis Wise is a 73 y.o. male.   The history is provided by the EMS personnel and the patient. The history is limited by the condition of the patient.  Fall This is a new problem. The current episode started less than 1 hour ago. The problem occurs constantly. The problem has not changed since onset.Pertinent negatives include no chest pain, no abdominal pain and no shortness of breath. Associated symptoms comments: Left hip and elbow pain . Nothing aggravates the symptoms. Nothing relieves the symptoms. He has tried nothing for the symptoms. The treatment provided no relief.  Patient with previous spinal fixation presents as a level 2 fall on thinners.  Stood up and passed out.      Past Medical History:  Diagnosis Date   Actinic keratosis 10/16/2013   Bilateral carotid artery stenosis 09/23/2021   Less than 50% bilaterally 2023   Cervical stenosis of spinal canal    Combined form of senile cataract of both eyes 05/02/2016   Diabetic retinopathy 03/21/2005   Dupuytren contracture 03/03/2019   Essential hypertension 11/20/1999   Last Assessment & Plan: Change to 5mg  ramipril  and see if the lightheaded troubles get better.  He'll update me as needed.   [name]    Generalized anxiety disorder 10/01/2022   GERD (gastroesophageal reflux disease) 01/1989   Hearing loss 10/20/2014   Helicobacter pylori gastritis 06/11/2011   On EGD 05/2011    Hemorrhagic disorder due to circulating anticoagulants 09/27/2022   History of colonic polyps 06/05/2012   06/05/2011 - 12 polyps max 7 mm, at least 9 adenomas  04/07/2013 - no polyps - repeat colonoscopy 2019 [name], MD, [facility]        IMO SNOMED Dx Update Oct 2024     Hyperlipidemia    Internal hemorrhoids    Iron  deficiency anemia, unspecified 03/04/2011    Ischemic right PCA stroke 09/05/2022   Left homonymous hemianopsia 08/2022   Caused by right PCA stroke   MGUS (monoclonal gammopathy of unknown significance)    possible dx initially, had negative f/u   Mild vascular neurocognitive disorder 05/07/2023   Monoclonal B-cell lymphocytosis 01/15/2022   NAFLD (nonalcoholic fatty liver disease)    Nocturia 08/18/2021   Normocytic anemia 04/03/2021   Obesity (BMI 30-39.9) 10/16/2013   Pain in joint of left knee 09/27/2022   Pancytopenia, acquired 05/01/2011   Pneumonia    Pseudophakia, both eyes 07/17/2016   Pure hypercholesterolemia 04/21/1988   Last Assessment & Plan: Continue statin, continue work on diet, d/w pt about labs.  He agrees.   Shoulder pain 07/02/2022   Syncope 11/22/2021   Thrombocytopenia 05/03/2021   Tibial fracture 10/24/2010   L tibia.  Repair 2012.    L tibia.  Repair 2012.  Last Assessment & Plan:  Per ortho.     Type II diabetes mellitus 04/21/1988   With h/o dec in sensation on feet     Unstable ankle 09/12/2020     Prior to Admission medications   Medication Sig Start Date End Date Taking? Authorizing Provider  acetaminophen  (TYLENOL ) 325 MG tablet Take 2 tablets (650 mg total) by mouth every 4 (four) hours as needed for mild pain (or temp > 37.5 C (99.5 F)). 09/07/22   Sheikh, Omair Latif, DO  atorvastatin  (LIPITOR) 10 MG tablet  Take 1 tablet (10 mg total) by mouth at bedtime. 02/26/23   [name], MD  clopidogrel  (PLAVIX ) 75 MG tablet TAKE 1 TABLET EVERY DAY 09/17/23   [name], MD  escitalopram  (LEXAPRO ) 10 MG tablet Take 1 tablet (10 mg total) by mouth daily. 09/09/23   Saucier, Dorn Ruth, NP  glimepiride  (AMARYL ) 2 MG tablet Held as of 02/04/24 02/04/24   [name], MD  hydrochlorothiazide  (HYDRODIURIL ) 25 MG tablet Take 0.5 tablets (12.5 mg total) by mouth daily. 02/04/24   [name], MD  IRON -VITAMIN C PO Take by mouth daily. 60 mg tablet    [provider]   losartan  (COZAAR ) 100 MG tablet TAKE 1 TABLET EVERY DAY 09/17/23   [name], MD  memantine  (NAMENDA ) 5 MG tablet Take 1 tablet (5 mg total) by mouth 2 (two) times daily. 03/23/23   Wertman, Sara E, PA-C  metFORMIN  (GLUCOPHAGE ) 850 MG tablet Take 1 tablet (850 mg total) by mouth 2 (two) times daily. 02/26/23   [name], MD  Multiple Vitamin (MULTIVITAMIN) tablet Take 1 tablet by mouth daily. Centrum Silver    [provider]  niacin  (NIASPAN ) 500 MG CR tablet Take 3 tablets (1,500 mg total) by mouth daily. 08/15/21   [name], MD  pioglitazone  (ACTOS ) 45 MG tablet Take 1 tablet (45 mg total) by mouth daily. 02/26/23   [name], MD    Allergies: Hydralazine  and Promethazine hcl    Review of Systems  Respiratory:  Negative for shortness of breath.   Cardiovascular:  Negative for chest pain.  Gastrointestinal:  Negative for abdominal pain.    Updated Vital Signs BP (!) 155/53 (BP Location: Right Arm)   Pulse 64   Temp 97.8 F (36.6 C) (Oral)   Resp 14   Ht 6' 2 (1.88 m)   Wt 105.7 kg   SpO2 97%   BMI 29.92 kg/m   Physical Exam Constitutional:      General: He is not in acute distress.    Appearance: He is well-developed. He is not diaphoretic.  HENT:     Head: Normocephalic and atraumatic.  Eyes:     Conjunctiva/sclera: Conjunctivae normal.     Pupils: Pupils are equal, round, and reactive to light.  Cardiovascular:     Rate and Rhythm: Normal rate and regular rhythm.  Pulmonary:     Effort: Pulmonary effort is normal.     Breath sounds: Normal breath sounds. No wheezing or rales.  Abdominal:     General: Bowel sounds are normal.     Palpations: Abdomen is soft.     Tenderness: There is no abdominal tenderness. There is no guarding or rebound.  Musculoskeletal:        General: Tenderness present.     Left elbow: Deformity present.     Left forearm: Normal.     Left wrist: No snuff box tenderness.     Cervical back: Normal range of  motion and neck supple.     Left hip: Bony tenderness present. Decreased range of motion.     Right foot: Normal capillary refill. Normal pulse.     Left foot: Normal capillary refill. Normal pulse.  Skin:    General: Skin is warm and dry.     Capillary Refill: Capillary refill takes less than 2 seconds.  Neurological:     General: No focal deficit present.     Mental Status: He is alert and oriented to person,  place, and time.     Deep Tendon Reflexes: Reflexes normal.     (all labs ordered are listed, but only abnormal results are displayed) Results for orders placed or performed during the hospital encounter of 02/07/24  CBC with Differential   Collection Time: 02/07/24  5:42 AM  Result Value Ref Range   WBC 6.0 4.0 - 10.5 K/uL   RBC 3.14 (L) 4.22 - 5.81 MIL/uL   Hemoglobin 9.3 (L) 13.0 - 17.0 g/dL   HCT 71.9 (L) 60.9 - 47.9 %   MCV 89.2 80.0 - 100.0 fL   MCH 29.6 26.0 - 34.0 pg   MCHC 33.2 30.0 - 36.0 g/dL   RDW 85.0 88.4 - 84.4 %   Platelets 102 (L) 150 - 400 K/uL   nRBC 0.0 0.0 - 0.2 %   Neutrophils Relative % 73 %   Neutro Abs 4.3 1.7 - 7.7 K/uL   Lymphocytes Relative 15 %   Lymphs Abs 0.9 0.7 - 4.0 K/uL   Monocytes Relative 10 %   Monocytes Absolute 0.6 0.1 - 1.0 K/uL   Eosinophils Relative 1 %   Eosinophils Absolute 0.1 0.0 - 0.5 K/uL   Basophils Relative 0 %   Basophils Absolute 0.0 0.0 - 0.1 K/uL   Immature Granulocytes 1 %   Abs Immature Granulocytes 0.03 0.00 - 0.07 K/uL  I-stat chem 8, ED (not at [facility], [facility] or [facility])   Collection Time: 02/07/24  5:46 AM  Result Value Ref Range   Sodium 136 135 - 145 mmol/L   Potassium 3.8 3.5 - 5.1 mmol/L   Chloride 100 98 - 111 mmol/L   BUN 41 (H) 8 - 23 mg/dL   Creatinine, Ser 8.29 (H) 0.61 - 1.24 mg/dL   Glucose, Bld 750 (H) 70 - 99 mg/dL   Calcium , Ion 1.15 1.15 - 1.40 mmol/L   TCO2 23 22 - 32 mmol/L   Hemoglobin 9.2 (L) 13.0 - 17.0 g/dL   HCT 72.9 (L) 60.9 - 47.9 %   DG Chest Portable 1 View Result Date:  02/07/2024 EXAM: 1 VIEW XRAY OF THE CHEST 02/07/2024 06:09:18 AM COMPARISON: PA lateral chest 04/24/2023. CLINICAL HISTORY: Fall etc. Trauma level 2/ fall on thinners; syncopal episode while standing from his computer. FINDINGS: LUNGS AND PLEURA: No focal pulmonary opacity. No pulmonary edema. No pleural effusion. No pneumothorax. HEART AND MEDIASTINUM: Mild cardiomegaly. Stable mediastinum with aortic atherosclerosis. BONES AND SOFT TISSUES: Dorsal fusion hardware is partially visible at the upper 2 thoracic spine levels. There is thoracic spondylosis. Moderate bilateral shoulder arthrosis and osteopenia. IMPRESSION: 1. No acute cardiopulmonary process. 2. Stable chest with Mild cardiomegaly. 3. Osteopenia and degenerative change. Electronically signed by: Francis Quam MD 02/07/2024 06:37 AM EDT RP Workstation: [id number]   DG Pelvis Portable Result Date: 02/07/2024 EXAM: 1 or 2 VIEW(S) XRAY OF THE PELVIS 02/07/2024 06:10:07 AM COMPARISON: Chest, abdomen, and pelvis CT 03/19/2022. CLINICAL HISTORY: Fall etc. Trauma level 2/ fall on thinners; syncopal episode while standing from his computer. FINDINGS: BONES AND JOINTS: There is an acute transverse oblique basicervical proximal left femoral fracture with impaction at the fracture site and a mild varus angulation. There is osteopenia without other evidence for fractures including the bony pelvis and proximal right femur. No focal osseous lesion. No joint dislocation. There is mild arthrosis at the SI joints, both hips, and enthesopathic change of the pelvis. Advanced degenerative change in the visualized lower lumbar spine. SOFT TISSUES: There is extensive iliofemoral calcific arteriosclerosis. IMPRESSION: 1. Acute transverse  oblique basicervical proximal left femoral fracture with impaction and mild varus angulation. 2. Advanced degenerative change in the visualized lower lumbar spine. 3. Extensive iliofemoral calcific arteriosclerosis. Electronically signed  by: Francis Quam MD 02/07/2024 06:35 AM EDT RP Workstation: [id number]   DG Elbow Complete Left Result Date: 02/07/2024 EXAM: 3 VIEW(S) XRAY OF THE LEFT ELBOW COMPARISON: None available. CLINICAL HISTORY: Fall on thinners; syncopal episode while standing from his computer. FINDINGS: BONES AND JOINTS: Osteopenia. There is an acute oblique, minimally distracted and otherwise nondisplaced intraarticular comminuted fracture of the posterior aspect of the proximal ulna. There are no further displaced fractures. There is endosteal scalloping in the distal humerus and in the visualized portions of the radius and ulna. In this person's age group this may be seen with multiple myeloma, anemias, skeletal amyloidosis, and bone metastases. It is possible this could be a pathologic fracture, but the endosteal scalloping for the most part appears limited to the shafts of these bones. Clinical correlation is needed. Positive for displaced fat pad signs consistent with joint effusion. No joint dislocation. SOFT TISSUES: Moderate dorsal soft tissue swelling extends over the upper forearm. IMPRESSION: 1. Acute oblique, minimally distracted, otherwise nondisplaced intraarticular comminuted fracture of the posterior aspect of the proximal ulna. 2. Joint effusion with displaced fat pad signs. 3. Moderate dorsal soft tissue swelling extending over the upper forearm. 4. Osteopenia with endosteal scalloping in the distal humerus and visualized portions of the radius and ulna; concerning for potential pathologic process such as plasma cell dyscrasia, skeletal amyloidosis or metastatic disease; recommend appropriate hematologic/oncologic evaluation and follow-up imaging as indicated. 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 Electronically signed by: Francis Quam MD 02/07/2024 06:30 AM EDT RP Workstation: [id number]    Radiology: DG Chest Portable 1 View Result Date: 02/07/2024 EXAM: 1 VIEW XRAY OF  THE CHEST 02/07/2024 06:09:18 AM COMPARISON: PA lateral chest 04/24/2023. CLINICAL HISTORY: Fall etc. Trauma level 2/ fall on thinners; syncopal episode while standing from his computer. FINDINGS: LUNGS AND PLEURA: No focal pulmonary opacity. No pulmonary edema. No pleural effusion. No pneumothorax. HEART AND MEDIASTINUM: Mild cardiomegaly. Stable mediastinum with aortic atherosclerosis. BONES AND SOFT TISSUES: Dorsal fusion hardware is partially visible at the upper 2 thoracic spine levels. There is thoracic spondylosis. Moderate bilateral shoulder arthrosis and osteopenia. IMPRESSION: 1. No acute cardiopulmonary process. 2. Stable chest with Mild cardiomegaly. 3. Osteopenia and degenerative change. Electronically signed by: Francis Quam MD 02/07/2024 06:37 AM EDT RP Workstation: [id number]   DG Pelvis Portable Result Date: 02/07/2024 EXAM: 1 or 2 VIEW(S) XRAY OF THE PELVIS 02/07/2024 06:10:07 AM COMPARISON: Chest, abdomen, and pelvis CT 03/19/2022. CLINICAL HISTORY: Fall etc. Trauma level 2/ fall on thinners; syncopal episode while standing from his computer. FINDINGS: BONES AND JOINTS: There is an acute transverse oblique basicervical proximal left femoral fracture with impaction at the fracture site and a mild varus angulation. There is osteopenia without other evidence for fractures including the bony pelvis and proximal right femur. No focal osseous lesion. No joint dislocation. There is mild arthrosis at the SI joints, both hips, and enthesopathic change of the pelvis. Advanced degenerative change in the visualized lower lumbar spine. SOFT TISSUES: There is extensive iliofemoral calcific arteriosclerosis. IMPRESSION: 1. Acute transverse oblique basicervical proximal left femoral fracture with impaction and mild varus angulation. 2. Advanced degenerative change in the visualized lower lumbar spine. 3. Extensive iliofemoral calcific arteriosclerosis. Electronically signed by: Francis Quam MD 02/07/2024  06:35 AM EDT RP Workstation: [id number]  DG Elbow Complete Left Result Date: 02/07/2024 EXAM: 3 VIEW(S) XRAY OF THE LEFT ELBOW COMPARISON: None available. CLINICAL HISTORY: Fall on thinners; syncopal episode while standing from his computer. FINDINGS: BONES AND JOINTS: Osteopenia. There is an acute oblique, minimally distracted and otherwise nondisplaced intraarticular comminuted fracture of the posterior aspect of the proximal ulna. There are no further displaced fractures. There is endosteal scalloping in the distal humerus and in the visualized portions of the radius and ulna. In this person's age group this may be seen with multiple myeloma, anemias, skeletal amyloidosis, and bone metastases. It is possible this could be a pathologic fracture, but the endosteal scalloping for the most part appears limited to the shafts of these bones. Clinical correlation is needed. Positive for displaced fat pad signs consistent with joint effusion. No joint dislocation. SOFT TISSUES: Moderate dorsal soft tissue swelling extends over the upper forearm. IMPRESSION: 1. Acute oblique, minimally distracted, otherwise nondisplaced intraarticular comminuted fracture of the posterior aspect of the proximal ulna. 2. Joint effusion with displaced fat pad signs. 3. Moderate dorsal soft tissue swelling extending over the upper forearm. 4. Osteopenia with endosteal scalloping in the distal humerus and visualized portions of the radius and ulna; concerning for potential pathologic process such as plasma cell dyscrasia, skeletal amyloidosis or metastatic disease; recommend appropriate hematologic/oncologic evaluation and follow-up imaging as indicated. 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 Electronically signed by: Francis Quam MD 02/07/2024 06:30 AM EDT RP Workstation: [id number]     .Laceration Repair  Date/Time: 02/07/2024 7:03 AM  Performed by: Nettie Earing, MD Authorized by: Nettie Earing, MD   Consent:    Consent obtained:  Verbal   Consent given by:  Patient   Risks discussed:  Infection, need for additional repair and nerve damage Anesthesia:    Anesthesia method:  None Laceration details:    Location: forehead.   Length (cm):  1   Depth (mm):  0.3 Exploration:    Wound extent: fascia not violated   Skin repair:    Repair method:  Tissue adhesive Repair type:    Repair type:  Simple Post-procedure details:    Dressing:  Open (no dressing)    Medications Ordered in the ED  lidocaine -EPINEPHrine -tetracaine (LET) topical gel (has no administration in time range)  Tdap (ADACEL) injection 0.5 mL (0.5 mLs Intramuscular Given 02/07/24 [id number])                                    Medical Decision Making Patient had syncopal event and has left elbow and hip pain   Amount and/or Complexity of Data Reviewed Independent Historian: EMS    Details: See above  External Data Reviewed: labs and notes.    Details: Previous creatinine 1.71  Labs: ordered.    Details: Normal white count 6, low hemoglobin 9.3, platelets slight low 102. Normal sodium 136, normal potassium 3.8, creatinine elevated 1.7 Radiology: ordered and independent interpretation performed.    Details: Subcapital hip fracture by me on XR proximal ulnar fracture on XR  Risk Prescription drug management.    Medications  lidocaine -EPINEPHrine -tetracaine (LET) topical gel (has no administration in time range)  0.9 %  sodium chloride  infusion (has no administration in time range)  fentaNYL  (SUBLIMAZE ) injection 50 mcg (has no administration in time range)  ondansetron  (ZOFRAN ) injection 4 mg (has no administration in time range)  Tdap (ADACEL) injection 0.5 mL (  0.5 mLs Intramuscular Given 02/07/24 9372)    Final diagnoses:  None   Signed out to Dr. Jerrol pending admission  ED Discharge Orders     None          Merikay Lesniewski, MD 02/07/24 [id number]

## 2024-02-08 ENCOUNTER — Inpatient Hospital Stay (HOSPITAL_COMMUNITY)

## 2024-02-08 ENCOUNTER — Inpatient Hospital Stay (HOSPITAL_COMMUNITY): Admitting: Anesthesiology

## 2024-02-08 ENCOUNTER — Encounter (HOSPITAL_COMMUNITY): Payer: Self-pay | Admitting: Hospitalist

## 2024-02-08 ENCOUNTER — Encounter (HOSPITAL_COMMUNITY)
Admission: EM | Disposition: A | Discharge status: Skilled Nursing Facility | Payer: Self-pay | Source: Home / Self Care | Attending: Internal Medicine

## 2024-02-08 DIAGNOSIS — E119 Type 2 diabetes mellitus without complications: Secondary | ICD-10-CM

## 2024-02-08 DIAGNOSIS — I10 Essential (primary) hypertension: Secondary | ICD-10-CM | POA: Diagnosis not present

## 2024-02-08 DIAGNOSIS — F419 Anxiety disorder, unspecified: Secondary | ICD-10-CM

## 2024-02-08 DIAGNOSIS — S42402A Unspecified fracture of lower end of left humerus, initial encounter for closed fracture: Secondary | ICD-10-CM | POA: Diagnosis not present

## 2024-02-08 DIAGNOSIS — S72002A Fracture of unspecified part of neck of left femur, initial encounter for closed fracture: Secondary | ICD-10-CM | POA: Diagnosis not present

## 2024-02-08 HISTORY — PX: ORIF ELBOW FRACTURE: SHX5031

## 2024-02-08 LAB — CBC WITH DIFFERENTIAL/PLATELET
Abs Immature Granulocytes: 0.02 K/uL (ref 0.00–0.07)
Basophils Absolute: 0 K/uL (ref 0.0–0.1)
Basophils Relative: 0 %
Eosinophils Absolute: 0.2 K/uL (ref 0.0–0.5)
Eosinophils Relative: 4 %
HCT: 25.1 % — ABNORMAL LOW (ref 39.0–52.0)
Hemoglobin: 8.4 g/dL — ABNORMAL LOW (ref 13.0–17.0)
Immature Granulocytes: 0 %
Lymphocytes Relative: 19 %
Lymphs Abs: 1 K/uL (ref 0.7–4.0)
MCH: 29.4 pg (ref 26.0–34.0)
MCHC: 33.5 g/dL (ref 30.0–36.0)
MCV: 87.8 fL (ref 80.0–100.0)
Monocytes Absolute: 0.6 K/uL (ref 0.1–1.0)
Monocytes Relative: 10 %
Neutro Abs: 3.6 K/uL (ref 1.7–7.7)
Neutrophils Relative %: 67 %
Platelets: 100 K/uL — ABNORMAL LOW (ref 150–400)
RBC: 2.86 MIL/uL — ABNORMAL LOW (ref 4.22–5.81)
RDW: 15.3 % (ref 11.5–15.5)
WBC: 5.4 K/uL (ref 4.0–10.5)
nRBC: 0 % (ref 0.0–0.2)

## 2024-02-08 LAB — URINALYSIS, ROUTINE W REFLEX MICROSCOPIC
Bacteria, UA: NONE SEEN
Bilirubin Urine: NEGATIVE
Glucose, UA: 150 mg/dL — AB
Hgb urine dipstick: NEGATIVE
Ketones, ur: NEGATIVE mg/dL
Leukocytes,Ua: NEGATIVE
Nitrite: NEGATIVE
Protein, ur: 30 mg/dL — AB
Specific Gravity, Urine: 1.015 (ref 1.005–1.030)
pH: 5 (ref 5.0–8.0)

## 2024-02-08 LAB — GLUCOSE, CAPILLARY
Glucose-Capillary: 188 mg/dL — ABNORMAL HIGH (ref 70–99)
Glucose-Capillary: 160 mg/dL — ABNORMAL HIGH (ref 70–99)
Glucose-Capillary: 208 mg/dL — ABNORMAL HIGH (ref 70–99)
Glucose-Capillary: 168 mg/dL — ABNORMAL HIGH (ref 70–99)
Glucose-Capillary: 288 mg/dL — ABNORMAL HIGH (ref 70–99)

## 2024-02-08 LAB — BASIC METABOLIC PANEL WITH GFR
Anion gap: 9 (ref 5–15)
BUN: 46 mg/dL — ABNORMAL HIGH (ref 8–23)
CO2: 23 mmol/L (ref 22–32)
Calcium: 8.4 mg/dL — ABNORMAL LOW (ref 8.9–10.3)
Chloride: 103 mmol/L (ref 98–111)
Creatinine, Ser: 1.7 mg/dL — ABNORMAL HIGH (ref 0.61–1.24)
GFR, Estimated: 42 mL/min — ABNORMAL LOW (ref >60)
Glucose, Bld: 181 mg/dL — ABNORMAL HIGH (ref 70–99)
Potassium: 4 mmol/L (ref 3.5–5.1)
Sodium: 135 mmol/L (ref 135–145)

## 2024-02-08 SURGERY — OPEN REDUCTION INTERNAL FIXATION (ORIF) ELBOW/OLECRANON FRACTURE
Anesthesia: General | Site: Elbow | Laterality: Left

## 2024-02-08 MED ORDER — LIDOCAINE 2% (20 MG/ML) 5 ML SYRINGE
INTRAMUSCULAR | Status: DC | PRN
  Administered 2024-02-08: 100 mg via INTRAVENOUS

## 2024-02-08 MED ORDER — CHLORHEXIDINE GLUCONATE 0.12 % MT SOLN: 15.0000 mL | Freq: Once | OROMUCOSAL | Status: AC

## 2024-02-08 MED ORDER — MIDAZOLAM HCL 2 MG/2ML IJ SOLN
INTRAMUSCULAR | Status: AC
  Filled 2024-02-08: qty 2

## 2024-02-08 MED ORDER — PROPOFOL 10 MG/ML IV BOLUS
INTRAVENOUS | Status: DC | PRN
  Administered 2024-02-08: 150 mg via INTRAVENOUS

## 2024-02-08 MED ORDER — VANCOMYCIN HCL 1000 MG IV SOLR
INTRAVENOUS | Status: AC
  Filled 2024-02-08: qty 20

## 2024-02-08 MED ORDER — POLYETHYLENE GLYCOL 3350 17 G PO PACK
17.0000 g | PACK | Freq: Two times a day (BID) | ORAL | Status: DC
  Administered 2024-02-09 – 2024-02-17 (×13): 17 g via ORAL
  Filled 2024-02-08 (×17): qty 1

## 2024-02-08 MED ORDER — CEFAZOLIN SODIUM-DEXTROSE 2-4 GM/100ML-% IV SOLN
2.0000 g | Freq: Three times a day (TID) | INTRAVENOUS | Status: AC
  Administered 2024-02-08 – 2024-02-09 (×3): 2 g via INTRAVENOUS
  Filled 2024-02-08 (×3): qty 100

## 2024-02-08 MED ORDER — FENTANYL CITRATE (PF) 250 MCG/5ML IJ SOLN
INTRAMUSCULAR | Status: DC | PRN
  Administered 2024-02-08 (×2): 100 ug via INTRAVENOUS
  Administered 2024-02-08: 50 ug via INTRAVENOUS

## 2024-02-08 MED ORDER — VITAMIN D 25 MCG (1000 UNIT) PO TABS: 1000.0000 [IU] | ORAL_TABLET | Freq: Every day | ORAL | Status: DC

## 2024-02-08 MED ORDER — AMISULPRIDE (ANTIEMETIC) 5 MG/2ML IV SOLN: 10.0000 mg | Freq: Once | INTRAVENOUS | Status: DC | PRN

## 2024-02-08 MED ORDER — ROCURONIUM BROMIDE 10 MG/ML (PF) SYRINGE
PREFILLED_SYRINGE | INTRAVENOUS | Status: DC | PRN
  Administered 2024-02-08: 40 mg via INTRAVENOUS
  Administered 2024-02-08: 60 mg via INTRAVENOUS

## 2024-02-08 MED ORDER — MIDAZOLAM HCL (PF) 2 MG/2ML IJ SOLN
INTRAMUSCULAR | Status: DC | PRN
  Administered 2024-02-08: 2 mg via INTRAVENOUS

## 2024-02-08 MED ORDER — OXYCODONE HCL 5 MG/5ML PO SOLN: 5.0000 mg | Freq: Once | ORAL | Status: DC | PRN

## 2024-02-08 MED ORDER — LACTATED RINGERS IV SOLN: INTRAVENOUS | Status: DC | PRN

## 2024-02-08 MED ORDER — CHLORHEXIDINE GLUCONATE 0.12 % MT SOLN
OROMUCOSAL | Status: AC
  Administered 2024-02-08: 15 mL via OROMUCOSAL
  Filled 2024-02-08: qty 15

## 2024-02-08 MED ORDER — LACTATED RINGERS IV SOLN
INTRAVENOUS | Status: DC
Start: 2024-02-08 — End: 2024-02-08

## 2024-02-08 MED ORDER — ONDANSETRON HCL 4 MG/2ML IJ SOLN
INTRAMUSCULAR | Status: DC | PRN
  Administered 2024-02-08: 4 mg via INTRAVENOUS

## 2024-02-08 MED ORDER — PHENYLEPHRINE HCL-NACL 20-0.9 MG/250ML-% IV SOLN
INTRAVENOUS | Status: DC | PRN
  Administered 2024-02-08: 30 ug/min via INTRAVENOUS

## 2024-02-08 MED ORDER — PSYLLIUM 95 % PO PACK
1.0000 | PACK | Freq: Every day | ORAL | Status: DC
  Administered 2024-02-10 – 2024-02-16 (×7): 1 via ORAL
  Filled 2024-02-08 (×9): qty 1

## 2024-02-08 MED ORDER — FENTANYL CITRATE (PF) 100 MCG/2ML IJ SOLN: 25.0000 ug | INTRAMUSCULAR | Status: DC | PRN

## 2024-02-08 MED ORDER — DEXAMETHASONE SOD PHOSPHATE PF 10 MG/ML IJ SOLN
INTRAMUSCULAR | Status: DC | PRN
  Administered 2024-02-08: 4 mg via INTRAVENOUS

## 2024-02-08 MED ORDER — ORAL CARE MOUTH RINSE: 15.0000 mL | Freq: Once | OROMUCOSAL | Status: AC

## 2024-02-08 MED ORDER — OXYCODONE HCL 5 MG PO TABS: 5.0000 mg | ORAL_TABLET | Freq: Once | ORAL | Status: DC | PRN

## 2024-02-08 MED ORDER — FENTANYL CITRATE (PF) 250 MCG/5ML IJ SOLN
INTRAMUSCULAR | Status: AC
  Filled 2024-02-08: qty 5

## 2024-02-08 MED ORDER — VITAMIN D 25 MCG (1000 UNIT) PO TABS
1000.0000 [IU] | ORAL_TABLET | Freq: Every day | ORAL | Status: DC
  Administered 2024-02-09 – 2024-02-17 (×8): 1000 [IU] via ORAL
  Filled 2024-02-08 (×8): qty 1

## 2024-02-08 MED ORDER — ENSURE PLUS HIGH PROTEIN PO LIQD
237.0000 mL | Freq: Two times a day (BID) | ORAL | Status: DC
  Administered 2024-02-09 – 2024-02-17 (×11): 237 mL via ORAL

## 2024-02-08 MED ORDER — SUGAMMADEX SODIUM 200 MG/2ML IV SOLN
INTRAVENOUS | Status: DC | PRN
  Administered 2024-02-08: 200 mg via INTRAVENOUS

## 2024-02-08 MED ORDER — PROPOFOL 10 MG/ML IV BOLUS
INTRAVENOUS | Status: AC
  Filled 2024-02-08: qty 20

## 2024-02-08 MED ORDER — CEFAZOLIN SODIUM-DEXTROSE 2-4 GM/100ML-% IV SOLN
2.0000 g | INTRAVENOUS | Status: AC
  Administered 2024-02-08: 2 g via INTRAVENOUS
  Filled 2024-02-08: qty 100

## 2024-02-08 SURGICAL SUPPLY — 69 items
BAG COUNTER SPONGE SURGICOUNT (BAG) ×1 IMPLANT
BENZOIN TINCTURE PRP APPL 2/3 (GAUZE/BANDAGES/DRESSINGS) IMPLANT
BIT DRILL 2.3 120MM (DRILL) IMPLANT
BLADE AVERAGE 25X9 (BLADE) ×1 IMPLANT
BLADE CLIPPER SURG (BLADE) ×1 IMPLANT
BLADE SURG 10 STRL SS (BLADE) IMPLANT
BNDG COHESIVE 4X5 TAN STRL LF (GAUZE/BANDAGES/DRESSINGS) ×1 IMPLANT
BNDG COMPR ESMARK 4X3 LF (GAUZE/BANDAGES/DRESSINGS) ×1 IMPLANT
BNDG ELASTIC 3INX 5YD STR LF (GAUZE/BANDAGES/DRESSINGS) ×1 IMPLANT
BNDG ELASTIC 4X5.8 VLCR NS LF (GAUZE/BANDAGES/DRESSINGS) IMPLANT
BNDG ELASTIC 4X5.8 VLCR STR LF (GAUZE/BANDAGES/DRESSINGS) ×1 IMPLANT
BNDG ELASTIC 6INX 5YD STR LF (GAUZE/BANDAGES/DRESSINGS) ×1 IMPLANT
BNDG ELASTIC 6X10 VLCR STRL LF (GAUZE/BANDAGES/DRESSINGS) IMPLANT
BNDG GAUZE DERMACEA FLUFF 4 (GAUZE/BANDAGES/DRESSINGS) ×1 IMPLANT
BRUSH SCRUB EZ 4% CHG (MISCELLANEOUS) ×1 IMPLANT
BRUSH SCRUB EZ PLAIN DRY (MISCELLANEOUS) ×2 IMPLANT
CLEANER TIP ELECTROSURG 2X2 (MISCELLANEOUS) ×1 IMPLANT
COVER SURGICAL LIGHT HANDLE (MISCELLANEOUS) ×2 IMPLANT
CUFF TOURN SGL QUICK 18X4 (TOURNIQUET CUFF) IMPLANT
CUFF TRNQT CYL 24X4X16.5-23 (TOURNIQUET CUFF) IMPLANT
DRAPE C-ARM 42X72 X-RAY (DRAPES) IMPLANT
DRAPE C-ARMOR (DRAPES) ×1 IMPLANT
DRAPE INCISE IOBAN 66X45 STRL (DRAPES) IMPLANT
DRAPE U-SHAPE 47X51 STRL (DRAPES) ×1 IMPLANT
DRSG ADAPTIC 3X8 NADH LF (GAUZE/BANDAGES/DRESSINGS) IMPLANT
DRSG EMULSION OIL 3X3 NADH (GAUZE/BANDAGES/DRESSINGS) IMPLANT
DRSG MEPITEL 8X12 (GAUZE/BANDAGES/DRESSINGS) IMPLANT
ELECTRODE REM PT RTRN 9FT ADLT (ELECTROSURGICAL) ×1 IMPLANT
FACESHIELD WRAPAROUND OR TEAM (MASK) IMPLANT
GAUZE PAD ABD 8X10 STRL (GAUZE/BANDAGES/DRESSINGS) IMPLANT
GAUZE SPONGE 4X4 12PLY STRL (GAUZE/BANDAGES/DRESSINGS) ×1 IMPLANT
GAUZE XEROFORM 1X8 LF (GAUZE/BANDAGES/DRESSINGS) ×1 IMPLANT
GAUZE XEROFORM 5X9 LF (GAUZE/BANDAGES/DRESSINGS) ×1 IMPLANT
GLOVE BIO SURGEON STRL SZ8 (GLOVE) ×2 IMPLANT
GLOVE BIOGEL PI IND STRL 7.5 (GLOVE) ×1 IMPLANT
GLOVE BIOGEL PI IND STRL 8 (GLOVE) ×1 IMPLANT
GLOVE SURG ORTHO LTX SZ7.5 (GLOVE) ×2 IMPLANT
GOWN STRL REUS W/ TWL LRG LVL3 (GOWN DISPOSABLE) ×2 IMPLANT
GOWN STRL REUS W/ TWL XL LVL3 (GOWN DISPOSABLE) ×1 IMPLANT
KIT BASIN OR (CUSTOM PROCEDURE TRAY) ×1 IMPLANT
KIT TURNOVER KIT B (KITS) ×1 IMPLANT
MANIFOLD NEPTUNE II (INSTRUMENTS) ×1 IMPLANT
PACK ORTHO EXTREMITY (CUSTOM PROCEDURE TRAY) ×1 IMPLANT
PAD ARMBOARD POSITIONER FOAM (MISCELLANEOUS) ×2 IMPLANT
PAD CAST 4YDX4 CTTN HI CHSV (CAST SUPPLIES) ×1 IMPLANT
PLATE 6H HOOK LT ELBOW (Plate) IMPLANT
SCREW CORT HEX 3.2X38 (Screw) IMPLANT
SCREW CORTICAL 3.2X20MM (Screw) IMPLANT
SCREW CORTICAL 3.2X22MM (Screw) IMPLANT
SCREW CORTICAL 3.2X24MM (Screw) IMPLANT
SCREW HEX CORTICAL 3.2X32 (Screw) IMPLANT
SCREW NON-LOCK CORTICAL 3.2X26 (Screw) IMPLANT
SOLN 0.9% NACL POUR BTL 1000ML (IV SOLUTION) ×1 IMPLANT
SOLN STERILE WATER BTL 1000 ML (IV SOLUTION) ×1 IMPLANT
SPIKE FLUID TRANSFER (MISCELLANEOUS) IMPLANT
SPONGE T-LAP 18X18 ~~LOC~~+RFID (SPONGE) ×2 IMPLANT
STAPLER SKIN PROX 35W (STAPLE) ×1 IMPLANT
STRIP CLOSURE SKIN 1/2X4 (GAUZE/BANDAGES/DRESSINGS) IMPLANT
SUCTION TUBE FRAZIER 10FR DISP (SUCTIONS) ×1 IMPLANT
SUT PDS AB 2-0 CT1 27 (SUTURE) IMPLANT
SUT PROLENE 3 0 PS 2 (SUTURE) ×2 IMPLANT
SUT VIC AB 0 CT1 27XBRD ANBCTR (SUTURE) ×2 IMPLANT
SUT VIC AB 2-0 CT1 TAPERPNT 27 (SUTURE) ×2 IMPLANT
SYR CONTROL 10ML LL (SYRINGE) ×1 IMPLANT
TOWEL GREEN STERILE (TOWEL DISPOSABLE) ×2 IMPLANT
TOWEL GREEN STERILE FF (TOWEL DISPOSABLE) IMPLANT
TUBE CONNECTING 12X1/4 (SUCTIONS) ×1 IMPLANT
UNDERPAD 30X36 HEAVY ABSORB (UNDERPADS AND DIAPERS) ×1 IMPLANT
YANKAUER SUCT BULB TIP NO VENT (SUCTIONS) ×1 IMPLANT

## 2024-02-08 NOTE — Inpatient Diabetes Management (Signed)
 Inpatient Diabetes Program Recommendations  AACE/ADA: New Consensus Statement on Inpatient Glycemic Control   Target Ranges:  Prepandial:   less than 140 mg/dL      Peak postprandial:   less than 180 mg/dL (1-2 hours)      Critically ill patients:  140 - 180 mg/dL   Lab Results  Component Value Date   GLUCAP 188 (H) 02/08/2024   HGBA1C 6.3 (A) 02/04/2024    Latest Reference Range & Units 02/07/24 12:07 02/07/24 18:18 02/07/24 21:05 02/08/24 06:36  Glucose-Capillary 70 - 99 mg/dL 710 (H) 796 (H) 807 (H) 188 (H)   Review of Glycemic Control  Diabetes history: DM2  Outpatient Diabetes medications: Amaryl  2mg  daily  Metformin  850mg  BID  Actos  45mg  daily    Current orders for Inpatient glycemic control:  Novolog  0-15 units TID   Inpatient Diabetes Program Recommendations:  Noted patient is currently NPO.  Once patient's diet is resumed, please consider adding Novolog  2 units TID with meals.   Thanks,  Lavanda Search, RN, MSN, [facility]  Inpatient Diabetes Coordinator  Pager [phone] (8a-5p)

## 2024-02-08 NOTE — Progress Notes (Signed)
 OT Cancellation Note  Patient Details Name: Dennis Wise MRN: [id number] DOB: 1950/06/25   Cancelled Treatment:    Reason Eval/Treat Not Completed: Other (comment) Pt to have L hip ORIF and L elbow ORIF. OT to follow as appropriate after surgery and post op orders.   Maurilio CROME, OTR/LSABRA  [facility]  Office: [phone]  Maurilio PARAS [name] 02/08/2024, 8:38 AM

## 2024-02-08 NOTE — Progress Notes (Signed)
 Echocardiogram unable to be performed today due to patient in surgery. Will be scheduled for 02/09/24.

## 2024-02-08 NOTE — Plan of Care (Signed)
   Problem: Skin Integrity: Goal: Risk for impaired skin integrity will decrease Outcome: Progressing   Problem: Activity: Goal: Risk for activity intolerance will decrease Outcome: Progressing   Problem: Pain Managment: Goal: General experience of comfort will improve and/or be controlled Outcome: Progressing   Problem: Safety: Goal: Ability to remain free from injury will improve Outcome: Progressing

## 2024-02-08 NOTE — Anesthesia Postprocedure Evaluation (Signed)
 Anesthesia Post Note  Patient: Dennis Wise  Procedure(s) Performed: OPEN REDUCTION INTERNAL FIXATION (ORIF) ELBOW/OLECRANON FRACTURE (Left: Elbow)     Patient location during evaluation: PACU Anesthesia Type: General Level of consciousness: awake Pain management: pain level controlled Vital Signs Assessment: post-procedure vital signs reviewed and stable Respiratory status: spontaneous breathing, nonlabored ventilation and respiratory function stable Cardiovascular status: blood pressure returned to baseline and stable Postop Assessment: no apparent nausea or vomiting Anesthetic complications: no   No notable events documented.                  Delon Aisha Arch

## 2024-02-08 NOTE — TOC CAGE-AID Note (Signed)
 Transition of Care [facility]) - CAGE-AID Screening   Patient Details  Name: Dennis Wise MRN: [id number] Date of Birth: [birth year]  [facility]) CM/SW Contact:    Krizia Flight E Tarynn Garling, LCSW Phone Number: 02/08/2024, 10:29 AM   Clinical Narrative: No SA noted.   CAGE-AID Screening:    Have You Ever Felt You Ought to Cut Down on Your Drinking or Drug Use?: No Have People Annoyed You By Critizing Your Drinking Or Drug Use?: No Have You Felt Bad Or Guilty About Your Drinking Or Drug Use?: No Have You Ever Had a Drink or Used Drugs First Thing In The Morning to Steady Your Nerves or to Get Rid of a Hangover?: No CAGE-AID Score: 0  Substance Abuse Education Offered: No

## 2024-02-08 NOTE — Transfer of Care (Signed)
 Immediate Anesthesia Transfer of Care Note  Patient: Dennis Wise  Procedure(s) Performed: OPEN REDUCTION INTERNAL FIXATION (ORIF) ELBOW/OLECRANON FRACTURE (Left: Elbow)  Patient Location: PACU  Anesthesia Type:General  Level of Consciousness: awake, alert , and oriented  Airway & Oxygen Therapy: Patient Spontanous Breathing and Patient connected to nasal cannula oxygen  Post-op Assessment: Report given to RN and Post -op Vital signs reviewed and stable  Post vital signs: Reviewed and stable  Last Vitals:  Vitals Value Taken Time  BP    Temp    Pulse 57 02/08/24 14:56  Resp 15 02/08/24 14:56  SpO2 94 % 02/08/24 14:56  Vitals shown include unfiled device data.  Last Pain:  Vitals:   02/08/24 1047  TempSrc: Oral  PainSc:          Complications: No notable events documented.

## 2024-02-08 NOTE — Anesthesia Procedure Notes (Signed)
 Procedure Name: Intubation Date/Time: 02/08/2024 1:02 PM  Performed by: Lockie Flesher, CRNAPre-anesthesia Checklist: Patient identified, Emergency Drugs available, Suction available and Patient being monitored Patient Re-evaluated:Patient Re-evaluated prior to induction Oxygen Delivery Method: Circle System Utilized Preoxygenation: Pre-oxygenation with 100% oxygen Induction Type: IV induction Ventilation: Mask ventilation without difficulty Laryngoscope Size: Glidescope and 4 Grade View: Grade II Tube type: Oral Tube size: 7.5 mm Number of attempts: 1 Airway Equipment and Method: Stylet and Oral airway Placement Confirmation: ETT inserted through vocal cords under direct vision, positive ETCO2 and breath sounds checked- equal and bilateral Secured at: 23 cm Tube secured with: Tape Dental Injury: Teeth and Oropharynx as per pre-operative assessment

## 2024-02-08 NOTE — Consult Note (Signed)
 Orthopaedic Trauma Service (OTS) Consult   Patient ID: Dennis Wise MRN: [id number] DOB/AGE: 75-Oct-1952 75 y.o.   Reason for Consult: Multiple fractures.  Including left proximal femur, left olecranon and right second metacarpal following a fall  Referring Physician: Lillia Mountain, MD (ortho)   HPI: Dennis Wise is an 73 y.o. male with medical history notable for diabetes, history of CVA, hypertension, CKD who sustained a fall at home on 02/07/2024.  Patient was getting up to get ready to go to shower when he sustained a fall at home.  Additionally when he was being transported to the ambulance there was reported seizure activity.  Patient is also 5 weeks s/p CT2 through T2 fusion by Dr. Dow in [place].  He has been wearing a soft collar and was wearing his soft collar when he fell.  CT does not show any acute changes to his fusion.   Patient is on Plavix  poststroke.  He has not had any since Saturday  Patient was brought to [facility] for evaluation was found to have a nondisplaced left olecranon fracture as well as a comminuted left proximal femur fracture with extension into this neck as well as severe comminution in the intertrochanteric region.  He was also complaining of some right hand pain x-rays of his right hand were obtained which does demonstrate mildly displaced fracture of his second metacarpal neck.  Orthopedic trauma service was consulted to assist with management of his injuries.  Currently patient is resting comfortably in bed.  Wife is at bedside.  Does complain mostly of left hip pain.  Right hand and left elbow are tolerable.  Of note his chart indicates he has a history of MGUS.  Wife states that his original oncologist retired and once he was following up with the new oncologist they were unable to find convincing evidence in the studies to confirm diagnosis of MGUS and this was about 8 years ago.  Past Medical History:  Diagnosis  Date   Actinic keratosis 10/16/2013   Bilateral carotid artery stenosis 09/23/2021   Less than 50% bilaterally 2023   Cervical stenosis of spinal canal    Combined form of senile cataract of both eyes 05/02/2016   Diabetic retinopathy 03/21/2005   Dupuytren contracture 03/03/2019   Essential hypertension 11/20/1999   Last Assessment & Plan: Change to 5mg  ramipril  and see if the lightheaded troubles get better.  He'll update me as needed.   [name]    Generalized anxiety disorder 10/01/2022   GERD (gastroesophageal reflux disease) 01/1989   Hearing loss 10/20/2014   Helicobacter pylori gastritis 06/11/2011   On EGD 05/2011    Hemorrhagic disorder due to circulating anticoagulants 09/27/2022   History of colonic polyps 06/05/2012   06/05/2011 - 12 polyps max 7 mm, at least 9 adenomas  04/07/2013 - no polyps - repeat colonoscopy 2019 [name], MD, [facility]        IMO SNOMED Dx Update Oct 2024     Hyperlipidemia    Internal hemorrhoids    Iron  deficiency anemia, unspecified 03/04/2011   Ischemic right PCA stroke 09/05/2022   Left homonymous hemianopsia 08/2022   Caused by right PCA stroke   MGUS (monoclonal gammopathy of unknown significance)    possible dx initially, had negative f/u   Mild vascular neurocognitive disorder 05/07/2023   Monoclonal B-cell lymphocytosis 01/15/2022   NAFLD (nonalcoholic fatty liver disease)    Nocturia 08/18/2021   Normocytic anemia 04/03/2021  Obesity (BMI 30-39.9) 10/16/2013   Pain in joint of left knee 09/27/2022   Pancytopenia, acquired 05/01/2011   Pneumonia    Pseudophakia, both eyes 07/17/2016   Pure hypercholesterolemia 04/21/1988   Last Assessment & Plan: Continue statin, continue work on diet, d/w pt about labs.  He agrees.   Shoulder pain 07/02/2022   Syncope 11/22/2021   Thrombocytopenia 05/03/2021   Tibial fracture 10/24/2010   L tibia.  Repair 2012.    L tibia.  Repair 2012.  Last Assessment & Plan:  Per ortho.     Type  II diabetes mellitus 04/21/1988   With h/o dec in sensation on feet     Unstable ankle 09/12/2020    Past Surgical History:  Procedure Laterality Date   CATARACT EXTRACTION Bilateral    COLONOSCOPY     ESOPHAGOGASTRODUODENOSCOPY     2012 H. pylori gastritis   FLEXIBLE SIGMOIDOSCOPY  05/1988   internal hemorrhoids   KNEE SURGERY  12/2002   lt knee fx repair ORIF   TIBIA FRACTURE SURGERY     left 2012    Family History  Problem Relation Age of Onset   Cancer Mother        ?   Diabetes Mother    Heart failure Mother    Emphysema Father    Cancer Father        ?   Alzheimer's disease Father    Dementia Father    Skin cancer Brother    Diabetes Brother    Colon cancer Neg Hx    Prostate cancer Neg Hx    Esophageal cancer Neg Hx    Rectal cancer Neg Hx    Stomach cancer Neg Hx     Social History:  reports that he has never smoked. He has never used smokeless tobacco. He reports that he does not drink alcohol and does not use drugs.  Allergies:  Allergies  Allergen Reactions   Hydralazine      Short of breath.     Promethazine Hcl     REACTION: AGITIATION    Medications: I have reviewed the patient's current medications. Current Outpatient Medications  Medication Instructions   acetaminophen  (TYLENOL ) 650 mg, Oral, Every 4 hours PRN   atorvastatin  (LIPITOR) 10 mg, Oral, Daily at bedtime   clopidogrel  (PLAVIX ) 75 mg, Oral, Daily   escitalopram  (LEXAPRO ) 10 mg, Oral, Daily   glimepiride  (AMARYL ) 2 MG tablet Held as of 02/04/24   hydrochlorothiazide  (HYDRODIURIL ) 12.5 mg, Oral, Daily   IRON -VITAMIN C PO 1 tablet, Every morning   losartan  (COZAAR ) 100 mg, Oral, Daily   memantine  (NAMENDA ) 5 mg, Oral, 2 times daily   metFORMIN  (GLUCOPHAGE ) 850 mg, Oral, 2 times daily   Multiple Vitamin (MULTIVITAMIN) tablet 1 tablet, Daily at bedtime   niacin  (VITAMIN B3) 1,500 mg, Oral, Daily   pioglitazone  (ACTOS ) 45 mg, Oral, Daily     Results for orders placed or performed  during the hospital encounter of 02/07/24 (from the past 48 hours)  CBC with Differential     Status: Abnormal   Collection Time: 02/07/24  5:42 AM  Result Value Ref Range   WBC 6.0 4.0 - 10.5 K/uL   RBC 3.14 (L) 4.22 - 5.81 MIL/uL   Hemoglobin 9.3 (L) 13.0 - 17.0 g/dL   HCT 71.9 (L) 60.9 - 47.9 %   MCV 89.2 80.0 - 100.0 fL   MCH 29.6 26.0 - 34.0 pg   MCHC 33.2 30.0 - 36.0 g/dL   RDW 85.0 88.4 -  15.5 %   Platelets 102 (L) 150 - 400 K/uL   nRBC 0.0 0.0 - 0.2 %   Neutrophils Relative % 73 %   Neutro Abs 4.3 1.7 - 7.7 K/uL   Lymphocytes Relative 15 %   Lymphs Abs 0.9 0.7 - 4.0 K/uL   Monocytes Relative 10 %   Monocytes Absolute 0.6 0.1 - 1.0 K/uL   Eosinophils Relative 1 %   Eosinophils Absolute 0.1 0.0 - 0.5 K/uL   Basophils Relative 0 %   Basophils Absolute 0.0 0.0 - 0.1 K/uL   Immature Granulocytes 1 %   Abs Immature Granulocytes 0.03 0.00 - 0.07 K/uL    Comment: Performed at [facility], [address], [place], [place] [address]  I-stat chem 8, ED (not at [facility], [facility] or [facility])     Status: Abnormal   Collection Time: 02/07/24  5:46 AM  Result Value Ref Range   Sodium 136 135 - 145 mmol/L   Potassium 3.8 3.5 - 5.1 mmol/L   Chloride 100 98 - 111 mmol/L   BUN 41 (H) 8 - 23 mg/dL   Creatinine, Ser 8.29 (H) 0.61 - 1.24 mg/dL   Glucose, Bld 750 (H) 70 - 99 mg/dL    Comment: Glucose reference range applies only to samples taken after fasting for at least 8 hours.   Calcium , Ion 1.15 1.15 - 1.40 mmol/L   TCO2 23 22 - 32 mmol/L   Hemoglobin 9.2 (L) 13.0 - 17.0 g/dL   HCT 72.9 (L) 60.9 - 47.9 %  VITAMIN D 25 Hydroxy (Vit-D Deficiency, Fractures)     Status: Abnormal   Collection Time: 02/07/24  8:59 AM  Result Value Ref Range   Vit D, 25-Hydroxy 29.05 (L) 30 - 100 ng/mL    Comment: (NOTE) Vitamin D deficiency has been defined by the Institute of Medicine  and an Endocrine Society practice guideline as a level of serum 25-OH  vitamin D less than 20 ng/mL (1,2). The Endocrine  Society went on to  further define vitamin D insufficiency as a level between 21 and 29  ng/mL (2).  1. IOM (Institute of Medicine). 2010. Dietary reference intakes for  calcium  and D. [place]: The [employer]. 2. Holick MF, Binkley [place], Bischoff-Ferrari HA, et al. Evaluation,  treatment, and prevention of vitamin D deficiency: an Endocrine  Society clinical practice guideline, JCEM. 2011 Jul; 96(7): 1911-30.  Performed at [facility], [address], [place], [place] [address]   Culture, blood (Routine X 2) w Reflex to ID Panel     Status: None (Preliminary result)   Collection Time: 02/07/24  9:12 AM   Specimen: BLOOD RIGHT HAND  Result Value Ref Range   Specimen Description BLOOD RIGHT HAND    Special Requests      BOTTLES DRAWN AEROBIC ONLY Blood Culture results may not be optimal due to an inadequate volume of blood received in culture bottles   Culture      NO GROWTH < 24 HOURS Performed at [facility], [address], [place], [place] [address]    Report Status PENDING   Ethanol     Status: None   Collection Time: 02/07/24  9:23 AM  Result Value Ref Range   Alcohol, Ethyl (B) <15 <15 mg/dL    Comment: (NOTE) For medical purposes only. Performed at [facility], [address] [place], [place] [address]   CBG monitoring, ED     Status:  Abnormal   Collection Time: 02/07/24 12:07 PM  Result Value Ref Range   Glucose-Capillary 289 (H) 70 - 99 mg/dL    Comment: Glucose reference range applies only to samples taken after fasting for at least 8 hours.  CBG monitoring, ED     Status: Abnormal   Collection Time: 02/07/24  6:18 PM  Result Value Ref Range   Glucose-Capillary 203 (H) 70 - 99 mg/dL    Comment: Glucose reference range applies only to samples taken after fasting for at least 8 hours.  Glucose, capillary     Status: Abnormal   Collection Time: 02/07/24  9:05 PM  Result Value Ref Range   Glucose-Capillary 192 (H) 70 - 99  mg/dL    Comment: Glucose reference range applies only to samples taken after fasting for at least 8 hours.  Culture, blood (Routine X 2) w Reflex to ID Panel     Status: None (Preliminary result)   Collection Time: 02/07/24 10:13 PM   Specimen: BLOOD  Result Value Ref Range   Specimen Description BLOOD SITE NOT SPECIFIED    Special Requests      BOTTLES DRAWN AEROBIC AND ANAEROBIC Blood Culture adequate volume   Culture      NO GROWTH < 12 HOURS Performed at [facility], [address] [place], [place] [address]    Report Status PENDING   Glucose, capillary     Status: Abnormal   Collection Time: 02/08/24  6:36 AM  Result Value Ref Range   Glucose-Capillary 188 (H) 70 - 99 mg/dL    Comment: Glucose reference range applies only to samples taken after fasting for at least 8 hours.   Comment 1 Document in Chart   Basic metabolic panel with GFR     Status: Abnormal   Collection Time: 02/08/24  7:05 AM  Result Value Ref Range   Sodium 135 135 - 145 mmol/L   Potassium 4.0 3.5 - 5.1 mmol/L   Chloride 103 98 - 111 mmol/L   CO2 23 22 - 32 mmol/L   Glucose, Bld 181 (H) 70 - 99 mg/dL    Comment: Glucose reference range applies only to samples taken after fasting for at least 8 hours.   BUN 46 (H) 8 - 23 mg/dL   Creatinine, Ser 8.29 (H) 0.61 - 1.24 mg/dL   Calcium  8.4 (L) 8.9 - 10.3 mg/dL   GFR, Estimated 42 (L) >60 mL/min    Comment: (NOTE) Calculated using the CKD-EPI Creatinine Equation (2021)    Anion gap 9 5 - 15    Comment: Performed at [facility], [address] [place], [place] [address]    DG Hand 2 View Right Result Date: 02/07/2024 CLINICAL DATA:  Right hand swelling. EXAM: RIGHT HAND - 2 VIEW COMPARISON:  None Available. FINDINGS: Age indeterminate angulated fracture of the distal second metacarpal call suboptimally evaluated on this radiograph. Correlation with clinical exam and point tenderness recommended. No other fracture. The bones are osteopenic. No  dislocation. Vascular calcifications noted. There is soft tissue swelling of the hand. No radiopaque foreign object/gas. IMPRESSION: Age indeterminate angulated fracture of the distal second metacarpal. Correlation with clinical exam and point tenderness recommended. Electronically Signed   By: Vanetta Chou M.D.   On: 02/07/2024 20:33   EEG adult Result Date: 02/07/2024 Dennis Dennis KIDD, MD     02/07/2024  3:06 PM Patient Name: Dennis Wise MRN: [id number] Epilepsy Attending: Arlin Wise Dennis Referring Physician/Provider: Georgina Basket, MD  Date: 02/07/2024 Duration: 29.14 mins Patient history: 73yo M with syncope. EEG to evaluate for seizure Level of alertness: Awake AEDs during EEG study: GBP Technical aspects: This EEG study was done with scalp electrodes positioned according to the 10-20 International system of electrode placement. Electrical activity was reviewed with band pass filter of 1-70Hz , sensitivity of 7 uV/mm, display speed of 32mm/sec with a 60Hz  notched filter applied as appropriate. EEG data were recorded continuously and digitally stored.  Video monitoring was available and reviewed as appropriate. Description: The posterior dominant rhythm consists of 8 Hz activity of moderate voltage (25-35 uV) seen predominantly in posterior head regions, symmetric and reactive to eye opening and eye closing. EEG showed continuous 5 to 7 Hz theta slowing in right temporo-occipital region. Hyperventilation and photic stimulation were not performed.   ABNORMALITY - Continuous slow, right temporo-occipital region IMPRESSION: This study is suggestive of cortical dysfunction arising from right temporo-occipital region likely secondary to underlying structural abnormality/ stroke. No seizures or epileptiform discharges were seen throughout the recording. Dennis MALVA Krebs   CT Elbow Left Wo Contrast Result Date: 02/07/2024 CLINICAL DATA:  Trauma to the left elbow. EXAM: CT OF THE UPPER LEFT EXTREMITY WITHOUT  CONTRAST TECHNIQUE: Multidetector CT imaging of the upper left extremity was performed according to the standard protocol. RADIATION DOSE REDUCTION: This exam was performed according to the departmental dose-optimization program which includes automated exposure control, adjustment of the mA and/or kV according to patient size and/or use of iterative reconstruction technique. COMPARISON:  Left elbow radiograph dated 02/07/2024. FINDINGS: Bones/Joint/Cartilage There is a comminuted intra-articular fracture of the proximal ulna with fracture lines involving the base of the olecranon process extending into the articular surface as well as fracture of the lateral aspect of the base of the coronoid process. There is approximately 3 mm distraction gap within the ulna and the olecranon fracture fragment. Additional minimally displaced fracture of the medial tip of the coronoid process. Evaluation for fracture is limited due to advanced osteopenia. Slight angulated appearance of the radial neck may be physiologic or related to degenerative changes. A fracture is less likely but not excluded (142/5). There is no dislocation. There is joint effusion and elevation of the anterior and posterior fat pads. Ligaments Suboptimally assessed by CT. Muscles and Tendons No acute findings. Soft tissues Subcutaneous edema of the dorsal elbow and forearm. IMPRESSION: 1. Comminuted intra-articular fracture of the proximal ulna. No dislocation. 2. Slight angulated appearance of the radial neck likely physiologic or related to degenerative changes. A fracture is less likely but not excluded. Electronically Signed   By: Vanetta Chou M.D.   On: 02/07/2024 13:11   DG Elbow 2 Views Left Result Date: 02/07/2024 EXAM: 1 or 2 VIEW(S) XRAY OF THE LEFT ELBOW COMPARISON: Left elbow series earlier today. CLINICAL HISTORY: Trauma following fall. Previous x-rays taken of left elbow, better lateral view was needed. AP and oblique views were  acceptable. Only one lateral image sent (shot cross table), best image obtainable due to patient unable to externally rotate wrist into lateral position. FINDINGS: BONES AND JOINTS: A single lateral projection of the left elbow redemonstrates the findings described previously, with again noted comminuted posterior proximal intraarticular ulnar fracture with slight distraction of the largest fragment, joint effusion with fat pad signs, and dorsal soft tissue swelling. Endosteal scalloping is also again noted in the shafts of the long bones. Osteopenia. No joint dislocation. SOFT TISSUES: Dorsal soft tissue swelling is noted. There are patchy calcifications in the right  ulnar and radial arteries in the forearm. IMPRESSION: 1. Comminuted posterior proximal intraarticular ulnar fracture with slight distraction of the largest fragment, with associated joint effusion and dorsal soft tissue swelling, as previously described. 2. Osteopenia and endosteal scalloping. Electronically signed by: Francis Quam MD 02/07/2024 07:37 AM EDT RP Workstation: [id number]   CT Hip Left Wo Contrast Result Date: 02/07/2024 EXAM: CT OF THE LEFT HIP WITHOUT IV CONTRAST 02/07/2024 06:20:00 AM TECHNIQUE: CT of the left hip was performed without the administration of intravenous contrast. Multiplanar reformatted images are provided for review. Automated exposure control, iterative reconstruction, and/or weight based adjustment of the mA/kV was utilized to reduce the radiation dose to as low as reasonably achievable. COMPARISON: None available. CLINICAL HISTORY: Hip trauma, fracture suspected, xray done. Chief complaints; Fall on Thinners; Hip trauma, Fracture suspected, Xray done ; TRAUMA FINDINGS: BONES: There is a comminuted fracture of the left femoral neck and intertrochanteric region. There is mild varus angulation. The left hemipelvis is intact. No aggressive appearing osseous abnormality or periostitis. SOFT TISSUE: No significant  soft tissue edema or fluid collections. No soft tissue mass. JOINT: There is minimal hemarthrosis. No significant degenerative changes. No osseous erosions. VASCULATURE: There are moderate vascular calcifications. INTRAPELVIC CONTENTS: Limited images of the intrapelvic contents are unremarkable. IMPRESSION: 1. Comminuted fracture of the left femoral neck and intertrochanteric region with mild varus angulation. 2. Minimal hemarthrosis. Electronically signed by: Evalene Coho MD 02/07/2024 07:25 AM EDT RP Workstation: [id number]   CT Maxillofacial Wo Contrast Result Date: 02/07/2024 EXAM: CT OF THE FACE WITHOUT CONTRAST 02/07/2024 06:37:00 AM TECHNIQUE: CT of the face was performed without the administration of intravenous contrast. Multiplanar reformatted images are provided for review. Automated exposure control, iterative reconstruction, and/or weight based adjustment of the mA/kV was utilized to reduce the radiation dose to as low as reasonably achievable. COMPARISON: Maxillofacial CT dated 11/19/2021. CLINICAL HISTORY: Polytrauma, blunt. Chief complaints; Fall on Thinners; Polytrauma, Blunt; TRAUMA. FINDINGS: FACIAL BONES: No acute facial fracture. No mandibular dislocation. No suspicious bone lesion. ORBITS: Globes are intact. No acute traumatic injury. No inflammatory change. SINUSES AND MASTOIDS: There is mild circumferential mucosal disease within the floors of the maxillary sinuses bilaterally. SOFT TISSUES: No acute abnormality. IMPRESSION: 1. No acute facial fracture. 2. Mild circumferential mucosal disease within the floors of the maxillary sinuses bilaterally. Electronically signed by: Evalene Coho MD 02/07/2024 07:21 AM EDT RP Workstation: [id number]   CT Cervical Spine Wo Contrast Result Date: 02/07/2024 EXAM: CT CERVICAL SPINE WITHOUT CONTRAST 02/07/2024 06:37:00 AM TECHNIQUE: CT of the cervical spine was performed without the administration of intravenous contrast. Multiplanar  reformatted images are provided for review. Automated exposure control, iterative reconstruction, and/or weight based adjustment of the mA/kV was utilized to reduce the radiation dose to as low as reasonably achievable. COMPARISON: CT of the cervical spine dated 11/19/2021. CLINICAL HISTORY: Polytrauma, blunt. Chief complaints; Fall on Thinners; Polytrauma, Blunt; TRAUMA FINDINGS: CERVICAL SPINE: BONES AND ALIGNMENT: No acute fracture or traumatic malalignment. The patient is status post interval decompression laminectomies C3-C6 and status post interval bilateral posterolateral spinal fusion extending from C2-T2. DEGENERATIVE CHANGES: There is mild-to-moderate diffuse chronic degenerative disc disease throughout the cervical spine. SOFT TISSUES: There is mild-to-moderate calcification within the carotid bulbs. No prevertebral soft tissue swelling. IMPRESSION: 1. No acute traumatic injury. 2. Status post interval decompression laminectomies C3 through C6 and bilateral posterolateral spinal fusion extending from C2 through T2. 3. Mild-to-moderate diffuse chronic degenerative disc disease throughout the cervical spine. Electronically signed by: Evalene Coho MD  02/07/2024 07:19 AM EDT RP Workstation: [id number]   CT Head Wo Contrast Result Date: 02/07/2024 EXAM: CT HEAD WITHOUT CONTRAST 02/07/2024 06:35:00 AM TECHNIQUE: CT of the head was performed without the administration of intravenous contrast. Automated exposure control, iterative reconstruction, and/or weight based adjustment of the mA/kV was utilized to reduce the radiation dose to as low as reasonably achievable. COMPARISON: CT angiogram of the head and neck dated 09/05/2022. CLINICAL HISTORY: Polytrauma, blunt. Chief complaints; Fall on Thinners; CT Head Wo Contrast; Polytrauma, Blunt; TRAUMA FINDINGS: BRAIN AND VENTRICLES: No acute hemorrhage. No evidence of acute infarct. Old right temporo occipital infarct with encephalomalacia and ex vacuo  dilatation of right lateral ventricle. Atrophy and chronic small vessel disease. No hydrocephalus. No extra-axial collection. No mass effect or midline shift. ORBITS: No acute abnormality. SINUSES: No acute abnormality. SOFT TISSUES AND SKULL: No acute soft tissue abnormality. No skull fracture. IMPRESSION: 1. No acute intracranial abnormality. 2. 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 3. Atrophy and chronic small vessel disease. Electronically signed by: Evalene Coho MD 02/07/2024 07:15 AM EDT RP Workstation: [id number]   DG Chest Portable 1 View Result Date: 02/07/2024 EXAM: 1 VIEW XRAY OF THE CHEST 02/07/2024 06:09:18 AM COMPARISON: PA lateral chest 04/24/2023. CLINICAL HISTORY: Fall etc. Trauma level 2/ fall on thinners; syncopal episode while standing from his computer. FINDINGS: LUNGS AND PLEURA: No focal pulmonary opacity. No pulmonary edema. No pleural effusion. No pneumothorax. HEART AND MEDIASTINUM: Mild cardiomegaly. Stable mediastinum with aortic atherosclerosis. BONES AND SOFT TISSUES: Dorsal fusion hardware is partially visible at the upper 2 thoracic spine levels. There is thoracic spondylosis. Moderate bilateral shoulder arthrosis and osteopenia. IMPRESSION: 1. No acute cardiopulmonary process. 2. Stable chest with Mild cardiomegaly. 3. Osteopenia and degenerative change. Electronically signed by: Francis Quam MD 02/07/2024 06:37 AM EDT RP Workstation: [id number]   DG Pelvis Portable Result Date: 02/07/2024 EXAM: 1 or 2 VIEW(S) XRAY OF THE PELVIS 02/07/2024 06:10:07 AM COMPARISON: Chest, abdomen, and pelvis CT 03/19/2022. CLINICAL HISTORY: Fall etc. Trauma level 2/ fall on thinners; syncopal episode while standing from his computer. FINDINGS: BONES AND JOINTS: There is an acute transverse oblique basicervical proximal left femoral fracture with impaction at the fracture site and a mild varus angulation. There is  osteopenia without other evidence for fractures including the bony pelvis and proximal right femur. No focal osseous lesion. No joint dislocation. There is mild arthrosis at the SI joints, both hips, and enthesopathic change of the pelvis. Advanced degenerative change in the visualized lower lumbar spine. SOFT TISSUES: There is extensive iliofemoral calcific arteriosclerosis. IMPRESSION: 1. Acute transverse oblique basicervical proximal left femoral fracture with impaction and mild varus angulation. 2. Advanced degenerative change in the visualized lower lumbar spine. 3. Extensive iliofemoral calcific arteriosclerosis. Electronically signed by: Francis Quam MD 02/07/2024 06:35 AM EDT RP Workstation: [id number]   DG Elbow Complete Left Result Date: 02/07/2024 EXAM: 3 VIEW(S) XRAY OF THE LEFT ELBOW COMPARISON: None available. CLINICAL HISTORY: Fall on thinners; syncopal episode while standing from his computer. FINDINGS: BONES AND JOINTS: Osteopenia. There is an acute oblique, minimally distracted and otherwise nondisplaced intraarticular comminuted fracture of the posterior aspect of the proximal ulna. There are no further displaced fractures. There is endosteal scalloping in the distal humerus and in the visualized portions of the radius and ulna. In this person's age group this may be seen with multiple myeloma, anemias, skeletal amyloidosis, and bone metastases. It is possible this could be a pathologic fracture,  but the endosteal scalloping for the most part appears limited to the shafts of these bones. Clinical correlation is needed. Positive for displaced fat pad signs consistent with joint effusion. No joint dislocation. SOFT TISSUES: Moderate dorsal soft tissue swelling extends over the upper forearm. IMPRESSION: 1. Acute oblique, minimally distracted, otherwise nondisplaced intraarticular comminuted fracture of the posterior aspect of the proximal ulna. 2. Joint effusion with displaced fat pad signs. 3.  Moderate dorsal soft tissue swelling extending over the upper forearm. 4. Osteopenia with endosteal scalloping in the distal humerus and visualized portions of the radius and ulna; concerning for potential pathologic process such as plasma cell dyscrasia, skeletal amyloidosis or metastatic disease; recommend appropriate hematologic/oncologic evaluation and follow-up imaging as indicated. 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 Electronically signed by: Francis Quam MD 02/07/2024 06:30 AM EDT RP Workstation: [id number]    Intake/Output      10/19 0701 10/20 0700 10/20 0701 10/21 0700   I.V. (mL/kg) 1357.8 (12.8) 33.3 (0.3)   Total Intake(mL/kg) 1357.8 (12.8) 33.3 (0.3)   Urine (mL/kg/hr) 400 (0.2) 400 (1.7)   Total Output 400 400   Net +957.8 -366.7           ROS As above Blood pressure (!) 148/55, pulse (!) 51, temperature 98.2 F (36.8 C), temperature source Oral, resp. rate 16, height 6' 2 (1.88 m), weight 105.7 kg, SpO2 95%. Physical Exam Gen: awake, alert, resting comfortably in bed, C-collar on  Lungs: unlabored Cardiac: reg, s1 and s2 Abd: soft, NT, ND, + BS Pelvis: no instability of pelvis noted with manipulation Extremities:  Left lower extremity   Left leg is shortened, externally rotated at the hip   Extremity is warm   No traumatic wounds   No appreciable knee effusion     No DCT, compartments are soft thigh is soft   DPN, SPN, TN sensory functions are intact   EHL, FHL, lesser toe motor functions are intact, symmetric strength of his EHL's bilaterally   Ankle flexion, extension, inversion and eversion are intact   + DP pulse   Knee, lower leg, ankle and foot are without crepitus or gross instability   Left upper extremity   Long-arm splint is in place, not removed   No significant swelling to his left hand   Radial, ulnar, median nerve motor and sensory function intact   AIN and PIN motor functions grossly intact   Shoulder  unremarkable   Right upper extremity/hand   Patient does have fairly moderate swelling to the radial aspect of his right hand   Small blister over his second metacarpal head   He does have discrete tenderness with palpation over his second metacarpal   No significant tenderness with palpation elsewhere to his hand or wrist   Motor and sensory functions are grossly intact to his right upper extremity      Assessment/Plan:  73 year old male complex medical history with acute closed comminuted left proximal femur fracture, closed left olecranon fracture, closed right second metacarpal neck fracture  - Fall  -Multiple orthopedic injuries  Highly comminuted left proximal femur fracture including neck and intertrochanteric region  Closed left olecranon fracture  Closed right second metacarpal neck fracture     Patient will need all of his injuries addressed surgically   His left proximal femur will need total hip arthroplasty due to the extensive comminution   Left olecranon will need to be fixed via plate osteosynthesis   Second metacarpal neck  fracture would benefit from being fixed as he will need to use a walker given his constellation of injuries    We are trying to coordinate his care as best as possible to minimize the number of trips to the OR  - History of C2-T2 fusion by Dr. Dow in [place]  Recommend reaching out to his office for any recommendations   I have reached out to one of the PAs over at the [place] office  Do not see any acute changes in his cervical spine CT scan  - Pain management:  Multimodal - ABL anemia/Hemodynamics  Will check CBC today - Medical issues   per primary - DVT/PE prophylaxis:  On hold including his Plavix  - ID:   Perioperative antibiotics - Metabolic Bone Disease:  Vitamin D is in the insufficient range   Supplement - Activity:  Bedrest for now  Commence therapies once all surgeries are completed  - FEN/GI  prophylaxis/Foley/Lines:  Maintain n.p.o. for now  - Impediments to fracture healing:  Vitamin D insufficiency  Suspect some degree of osteoporosis/osteopenia would recommend DEXA in the near future   - Dispo:  OR timing still being determined.  Again trying to minimize number of anesthesia events   Francis MICAEL Mt, PA-C [phone] (C) 02/08/2024, 9:17 AM  [facility] [address] [place] [address] [phone] MAXIMINO MILLING (F)    After 5pm and on the weekends please log on to Amion, go to orthopaedics and the look under the [facility] Call for the provider(s) on call. You can also call our office at [phone] and then follow the prompts to be connected to the call team.

## 2024-02-08 NOTE — Anesthesia Preprocedure Evaluation (Addendum)
 Anesthesia Evaluation  Patient identified by MRN, date of birth, ID band Patient awake    Reviewed: Allergy & Precautions, NPO status , Patient's Chart, lab work & pertinent test results  History of Anesthesia Complications Negative for: history of anesthetic complications  Airway Mallampati: III  TM Distance: >3 FB Neck ROM: Limited   Comment: In soft c-collar Dental  (+) Dental Advisory Given   Pulmonary neg pulmonary ROS   Pulmonary exam normal breath sounds clear to auscultation       Cardiovascular hypertension (HCTZ, losartan ), Pt. on medications (-) angina (-) Past MI, (-) Cardiac Stents and (-) CABG + dysrhythmias (h/o accelerated junctional rhythm)  Rhythm:Regular Rate:Normal  HLD, Bilateral carotid artery stenosis  TTE 09/07/2022: IMPRESSIONS    1. Left ventricular ejection fraction, by estimation, is 65 to 70%. The  left ventricle has normal function. The left ventricle has no regional  wall motion abnormalities. Left ventricular diastolic parameters are  consistent with Grade II diastolic  dysfunction (pseudonormalization).   2. Right ventricular systolic function is normal. The right ventricular  size is mildly enlarged. Tricuspid regurgitation signal is inadequate for  assessing PA pressure.   3. Left atrial size was mildly dilated.   4. The mitral valve is grossly normal. Trivial mitral valve  regurgitation. No evidence of mitral stenosis.   5. Focal calcification present on the NCC. The aortic valve is tricuspid.  There is mild calcification of the aortic valve. Aortic valve  regurgitation is not visualized. Aortic valve sclerosis is present, with  no evidence of aortic valve stenosis.   6. The inferior vena cava is normal in size with greater than 50%  respiratory variability, suggesting right atrial pressure of 3 mmHg.     Neuro/Psych neg Seizures PSYCHIATRIC DISORDERS Anxiety     Mild vascular  neurocognitive disorder  Neuromuscular disease (cervical stenosis s/p C2-T2 fusion 5 weeks ago in [place]) CVA (08/2022), No Residual Symptoms    GI/Hepatic ,GERD  ,,NAFLD   Endo/Other  diabetes, Type 2, Oral Hypoglycemic Agents    Renal/GU ARFRenal disease     Musculoskeletal   Abdominal   Peds  Hematology  (+) Blood dyscrasia (MGUS, thrombocytopenia), anemia Lab Results      Component                Value               Date                      WBC                      6.0                 02/07/2024                HGB                      9.2 (L)             02/07/2024                HCT                      27.0 (L)            02/07/2024                MCV  89.2                02/07/2024                PLT                      102 (L)             02/07/2024              Anesthesia Other Findings Last Plavix : 02/06/2024  62M h/o MGUS, NAFLD, ischemic R PCA stroke in 2024, HTN, and DM2 p/w GLF c/b L hip and L elbow fractures iso syncope c/b AKI.  Last saw cardiology 12/17/2023 for clearance for spine surgery. Echocardiogram 08/2022 with normal biventricular systolic function with mild MR/TR. Holter 04/2023 showed sinus rhythm with sinus bradycardia, average heart rate of 54 bpm. No significant arrhythmias.  Reproductive/Obstetrics                              Anesthesia Physical Anesthesia Plan  ASA: 3  Anesthesia Plan: General   Post-op Pain Management: Ofirmev  IV (intra-op)*   Induction: Intravenous  PONV Risk Score and Plan: 2 and Ondansetron , Dexamethasone and Treatment may vary due to age or medical condition  Airway Management Planned: Oral ETT and Video Laryngoscope Planned  Additional Equipment:   Intra-op Plan:   Post-operative Plan: Extubation in OR  Informed Consent: I have reviewed the patients History and Physical, chart, labs and discussed the procedure including the risks, benefits and alternatives for  the proposed anesthesia with the patient or authorized representative who has indicated his/her understanding and acceptance.     Dental advisory given  Plan Discussed with: CRNA and Anesthesiologist  Anesthesia Plan Comments: (Risks of general anesthesia discussed including, but not limited to, sore throat, hoarse voice, chipped/damaged teeth, injury to vocal cords, nausea and vomiting, allergic reactions, lung infection, heart attack, stroke, and death. All questions answered. )         Anesthesia Quick Evaluation

## 2024-02-08 NOTE — Progress Notes (Signed)
 PROGRESS NOTE    [name]  FMW:[id number] DOB: [birth year] DOA: 02/07/2024 PCP: [name], MD  Chief Complaint  Patient presents with   Dennis Wise on Thinners    Brief Narrative:   73M h/o MGUS, NAFLD, ischemic R PCA stroke in 2024, HTN, and DM2 p/w GLF c/b L hip and L elbow fractures iso syncope c/b AKI.   Assessment & Plan:   Principal Problem:   Closed left hip fracture (HCC)  L hip fracture L elbow fracture R Distal Second Metacarpal Fracture Fragility fracture Forehead Lac Possible pathologic fracture give h/o MGUS -plain films of L elbow with osteopenia with endosteal scalloping in the distal humerus and visualized portions of the radius and ulna (concerning for pathologic process) - intraarticular fracture of proximal ulna, angulated appearance of radial neck (fx not excluded) - CT L hip with comminuted fracture of the L femoral neck and intertrochanteric region with mild varus angulation  - no acute injury to C spine - s/p decompression laminectomies C3-C6 and bilateral posterolateral spinal fusion from C2 through T2 - age indeterminate angulated fx of distal 2nd metacarpal -Orthopedic surgery consulted; apprec eval/recs - they're working on coordinating his trips to the OR -s/p surgery with orthopedics 10/20, op note pending -forehead lac repaired with tissue adhesive  -F/u 25-OH vitamin D - low, supplement  -follow outpatient with PCP to consider bisphosphonate -Multimodal pain control with tylenol  TID, robaxin TID, gabapentin TID, and oxycodone 5mg  q4h prn  AKI Baseline creatinine 1.3 Creatinine on admission 1.7 Continue to monitor  Follow UA, if not improving, follow renal US   Syncope -PT/OT consulted; apprec eval/recs -orthostatic vitals when able - based on patient report, new BP med Bartolo Montanye few weeks ago and syncope after change in positions - continue to hold hydrochlorothiazide .  Hold losartan  today, consider resumption 10/21.  - telemetry  monitoring -blood cultures pending -EtOH level negative -EEG without seizure or epileptiform discharges (right temporooccipital cortical dysfunction likely secondary to underlying structural abnromality/stroke) -F/u TTE to eval for structural/valvular heart disease -Consider 14d Holter monitor on discharge if no other obvious etiology   Hx MGUS - needs heme onc follow up outpatient   Recent C2-T2 Fusion - ortho reached out to PA's for Dr. Dow - per 10/10 note, was to continue hard collar another 3 weeks until follow up with Dr. Dow   Hx Stroke Old R temporooccipital infarct with encephalomalacia and ex vacuo dilation of R lateral ventricle Statin, plavix  on hold   T2DM Glimeperide and actos  and metformin  on hold SSI  HTN -PTA losartan  100mg  daily and hydrochlorothiazide  12.5mg  daily  Dyslipidemia - niacin  on hold - statin   Mood disorder -PTA Lexapro     DVT prophylaxis: SCD Code Status: full Family Communication: none Disposition:   Status is: Inpatient Remains inpatient appropriate because: need for continued inpatient admissions   Consultants:  orthopedics  Procedures:  Op note pending  Antimicrobials:  Anti-infectives (From admission, onward)    Start     Dose/Rate Route Frequency Ordered Stop   02/08/24 1100  [MAR Hold]  ceFAZolin (ANCEF) IVPB 2g/100 mL premix        (MAR Hold since Mon 02/08/2024 at 1037.Hold Reason: Transfer to [facility] Procedural area)   2 g 200 mL/hr over 30 Minutes Intravenous On call to O.R. 02/08/24 1009 02/08/24 1257       Subjective: No new complaints post op   Objective: Vitals:   02/08/24 0834 02/08/24 1047 02/08/24 1500 02/08/24 1515  BP: (!) 148/55 ROLLEN)  163/55 131/80 (!) 153/64  Pulse: (!) 51 (!) 56 60 60  Resp:  18 12 15   Temp: 98.2 F (36.8 C) 99 F (37.2 C) 98.5 F (36.9 C)   TempSrc: Oral Oral    SpO2: 95% 93% 91% 94%  Weight:  105.7 kg    Height:  6' 2 (1.88 m)      Intake/Output Summary (Last 24  hours) at 02/08/2024 1533 Last data filed at 02/08/2024 1530 Gross per 24 hour  Intake 2346.42 ml  Output 1150 ml  Net 1196.42 ml   Filed Weights   02/07/24 0622 02/08/24 1047  Weight: 105.7 kg 105.7 kg    Examination:  General exam: Appears calm and comfortable  L periorbital bruising Respiratory system: unlabored Cardiovascular system: RRR Gastrointestinal system: Abdomen is nondistended, soft and nontender. Central nervous system: Alert and oriented. No focal neurological deficits. Extremities: LLE in traction - LUE in splint    Data Reviewed: I have personally reviewed following labs and imaging studies  CBC: Recent Labs  Lab 02/04/24 0911 02/07/24 0542 02/07/24 0546 02/08/24 1031  WBC 3.6* 6.0  --  5.4  NEUTROABS 1.9 4.3  --  3.6  HGB 11.0* 9.3* 9.2* 8.4*  HCT 33.8* 28.0* 27.0* 25.1*  MCV 87.8 89.2  --  87.8  PLT 95* 102*  --  100*    Basic Metabolic Panel: Recent Labs  Lab 02/04/24 0911 02/07/24 0546 02/08/24 0705  NA 134* 136 135  K 4.2 3.8 4.0  CL 98 100 103  CO2 26  --  23  GLUCOSE 123* 249* 181*  BUN 37* 41* 46*  CREATININE 1.71* 1.70* 1.70*  CALCIUM  9.1  --  8.4*    GFR: Estimated Creatinine Clearance: 48.6 mL/min (Carmichael Burdette) (by C-G formula based on SCr of 1.7 mg/dL (H)).  Liver Function Tests: Recent Labs  Lab 02/04/24 0911  AST 17  ALT 11  ALKPHOS 131*  BILITOT 0.8  PROT 7.1  ALBUMIN 4.0    CBG: Recent Labs  Lab 02/07/24 1818 02/07/24 2105 02/08/24 0636 02/08/24 1057 02/08/24 1509  GLUCAP 203* 192* 188* 160* 208*     Recent Results (from the past 240 hours)  Culture, blood (Routine X 2) w Reflex to ID Panel     Status: None (Preliminary result)   Collection Time: 02/07/24  9:12 AM   Specimen: BLOOD RIGHT HAND  Result Value Ref Range Status   Specimen Description BLOOD RIGHT HAND  Final   Special Requests   Final    BOTTLES DRAWN AEROBIC ONLY Blood Culture results may not be optimal due to an inadequate volume of blood  received in culture bottles   Culture   Final    NO GROWTH < 24 HOURS Performed at [facility], [address], [place], [place] [address]    Report Status PENDING  Incomplete  Culture, blood (Routine X 2) w Reflex to ID Panel     Status: None (Preliminary result)   Collection Time: 02/07/24 10:13 PM   Specimen: BLOOD  Result Value Ref Range Status   Specimen Description BLOOD SITE NOT SPECIFIED  Final   Special Requests   Final    BOTTLES DRAWN AEROBIC AND ANAEROBIC Blood Culture adequate volume   Culture   Final    NO GROWTH < 12 HOURS Performed at [facility], [address], [place], [place] [address]    Report Status PENDING  Incomplete         Radiology Studies:  DG C-Arm 1-60 Min-No Report Result Date: 02/08/2024 Fluoroscopy was utilized by the requesting physician.  No radiographic interpretation.   DG Hand 2 View Right Result Date: 02/07/2024 CLINICAL DATA:  Right hand swelling. EXAM: RIGHT HAND - 2 VIEW COMPARISON:  None Available. FINDINGS: Age indeterminate angulated fracture of the distal second metacarpal call suboptimally evaluated on this radiograph. Correlation with clinical exam and point tenderness recommended. No other fracture. The bones are osteopenic. No dislocation. Vascular calcifications noted. There is soft tissue swelling of the hand. No radiopaque foreign object/gas. IMPRESSION: Age indeterminate angulated fracture of the distal second metacarpal. Correlation with clinical exam and point tenderness recommended. Electronically Signed   By: Vanetta Chou M.D.   On: 02/07/2024 20:33   EEG adult Result Date: 02/07/2024 Shelton Arlin KIDD, MD     02/07/2024  3:06 PM Patient Name: LUISMANUEL CORMAN MRN: [id number] Epilepsy Attending: Arlin KIDD Shelton Referring Physician/Provider: Georgina Basket, MD Date: 02/07/2024 Duration: 29.14 mins Patient history: 73yo M with syncope. EEG to evaluate for seizure Level of alertness: Awake AEDs during EEG study:  GBP Technical aspects: This EEG study was done with scalp electrodes positioned according to the 10-20 International system of electrode placement. Electrical activity was reviewed with band pass filter of 1-70Hz , sensitivity of 7 uV/mm, display speed of 5mm/sec with Ramond Darnell 60Hz  notched filter applied as appropriate. EEG data were recorded continuously and digitally stored.  Video monitoring was available and reviewed as appropriate. Description: The posterior dominant rhythm consists of 8 Hz activity of moderate voltage (25-35 uV) seen predominantly in posterior head regions, symmetric and reactive to eye opening and eye closing. EEG showed continuous 5 to 7 Hz theta slowing in right temporo-occipital region. Hyperventilation and photic stimulation were not performed.   ABNORMALITY - Continuous slow, right temporo-occipital region IMPRESSION: This study is suggestive of cortical dysfunction arising from right temporo-occipital region likely secondary to underlying structural abnormality/ stroke. No seizures or epileptiform discharges were seen throughout the recording. Arlin KIDD Shelton   CT Elbow Left Wo Contrast Result Date: 02/07/2024 CLINICAL DATA:  Trauma to the left elbow. EXAM: CT OF THE UPPER LEFT EXTREMITY WITHOUT CONTRAST TECHNIQUE: Multidetector CT imaging of the upper left extremity was performed according to the standard protocol. RADIATION DOSE REDUCTION: This exam was performed according to the departmental dose-optimization program which includes automated exposure control, adjustment of the mA and/or kV according to patient size and/or use of iterative reconstruction technique. COMPARISON:  Left elbow radiograph dated 02/07/2024. FINDINGS: Bones/Joint/Cartilage There is Altan Kraai comminuted intra-articular fracture of the proximal ulna with fracture lines involving the base of the olecranon process extending into the articular surface as well as fracture of the lateral aspect of the base of the coronoid  process. There is approximately 3 mm distraction gap within the ulna and the olecranon fracture fragment. Additional minimally displaced fracture of the medial tip of the coronoid process. Evaluation for fracture is limited due to advanced osteopenia. Slight angulated appearance of the radial neck may be physiologic or related to degenerative changes. Thuan Tippett fracture is less likely but not excluded (142/5). There is no dislocation. There is joint effusion and elevation of the anterior and posterior fat pads. Ligaments Suboptimally assessed by CT. Muscles and Tendons No acute findings. Soft tissues Subcutaneous edema of the dorsal elbow and forearm. IMPRESSION: 1. Comminuted intra-articular fracture of the proximal ulna. No dislocation. 2. Slight angulated appearance of the radial neck likely physiologic or related to degenerative changes. Greyson Riccardi fracture is less likely but not  excluded. Electronically Signed   By: Vanetta Chou M.D.   On: 02/07/2024 13:11   DG Elbow 2 Views Left Result Date: 02/07/2024 EXAM: 1 or 2 VIEW(S) XRAY OF THE LEFT ELBOW COMPARISON: Left elbow series earlier today. CLINICAL HISTORY: Trauma following fall. Previous x-rays taken of left elbow, better lateral view was needed. AP and oblique views were acceptable. Only one lateral image sent (shot cross table), best image obtainable due to patient unable to externally rotate wrist into lateral position. FINDINGS: BONES AND JOINTS: Mirca Yale single lateral projection of the left elbow redemonstrates the findings described previously, with again noted comminuted posterior proximal intraarticular ulnar fracture with slight distraction of the largest fragment, joint effusion with fat pad signs, and dorsal soft tissue swelling. Endosteal scalloping is also again noted in the shafts of the long bones. Osteopenia. No joint dislocation. SOFT TISSUES: Dorsal soft tissue swelling is noted. There are patchy calcifications in the right ulnar and radial arteries in the  forearm. IMPRESSION: 1. Comminuted posterior proximal intraarticular ulnar fracture with slight distraction of the largest fragment, with associated joint effusion and dorsal soft tissue swelling, as previously described. 2. Osteopenia and endosteal scalloping. Electronically signed by: Francis Quam MD 02/07/2024 07:37 AM EDT RP Workstation: [id number]   CT Hip Left Wo Contrast Result Date: 02/07/2024 EXAM: CT OF THE LEFT HIP WITHOUT IV CONTRAST 02/07/2024 06:20:00 AM TECHNIQUE: CT of the left hip was performed without the administration of intravenous contrast. Multiplanar reformatted images are provided for review. Automated exposure control, iterative reconstruction, and/or weight based adjustment of the mA/kV was utilized to reduce the radiation dose to as low as reasonably achievable. COMPARISON: None available. CLINICAL HISTORY: Hip trauma, fracture suspected, xray done. Chief complaints; Fall on Thinners; Hip trauma, Fracture suspected, Xray done ; TRAUMA FINDINGS: BONES: There is Samaia Iwata comminuted fracture of the left femoral neck and intertrochanteric region. There is mild varus angulation. The left hemipelvis is intact. No aggressive appearing osseous abnormality or periostitis. SOFT TISSUE: No significant soft tissue edema or fluid collections. No soft tissue mass. JOINT: There is minimal hemarthrosis. No significant degenerative changes. No osseous erosions. VASCULATURE: There are moderate vascular calcifications. INTRAPELVIC CONTENTS: Limited images of the intrapelvic contents are unremarkable. IMPRESSION: 1. Comminuted fracture of the left femoral neck and intertrochanteric region with mild varus angulation. 2. Minimal hemarthrosis. Electronically signed by: Evalene Coho MD 02/07/2024 07:25 AM EDT RP Workstation: [id number]   CT Maxillofacial Wo Contrast Result Date: 02/07/2024 EXAM: CT OF THE FACE WITHOUT CONTRAST 02/07/2024 06:37:00 AM TECHNIQUE: CT of the face was performed without the  administration of intravenous contrast. Multiplanar reformatted images are provided for review. Automated exposure control, iterative reconstruction, and/or weight based adjustment of the mA/kV was utilized to reduce the radiation dose to as low as reasonably achievable. COMPARISON: Maxillofacial CT dated 11/19/2021. CLINICAL HISTORY: Polytrauma, blunt. Chief complaints; Fall on Thinners; Polytrauma, Blunt; TRAUMA. FINDINGS: FACIAL BONES: No acute facial fracture. No mandibular dislocation. No suspicious bone lesion. ORBITS: Globes are intact. No acute traumatic injury. No inflammatory change. SINUSES AND MASTOIDS: There is mild circumferential mucosal disease within the floors of the maxillary sinuses bilaterally. SOFT TISSUES: No acute abnormality. IMPRESSION: 1. No acute facial fracture. 2. Mild circumferential mucosal disease within the floors of the maxillary sinuses bilaterally. Electronically signed by: Evalene Coho MD 02/07/2024 07:21 AM EDT RP Workstation: [id number]   CT Cervical Spine Wo Contrast Result Date: 02/07/2024 EXAM: CT CERVICAL SPINE WITHOUT CONTRAST 02/07/2024 06:37:00 AM TECHNIQUE: CT of the cervical spine was performed  without the administration of intravenous contrast. Multiplanar reformatted images are provided for review. Automated exposure control, iterative reconstruction, and/or weight based adjustment of the mA/kV was utilized to reduce the radiation dose to as low as reasonably achievable. COMPARISON: CT of the cervical spine dated 11/19/2021. CLINICAL HISTORY: Polytrauma, blunt. Chief complaints; Fall on Thinners; Polytrauma, Blunt; TRAUMA FINDINGS: CERVICAL SPINE: BONES AND ALIGNMENT: No acute fracture or traumatic malalignment. The patient is status post interval decompression laminectomies C3-C6 and status post interval bilateral posterolateral spinal fusion extending from C2-T2. DEGENERATIVE CHANGES: There is mild-to-moderate diffuse chronic degenerative disc disease  throughout the cervical spine. SOFT TISSUES: There is mild-to-moderate calcification within the carotid bulbs. No prevertebral soft tissue swelling. IMPRESSION: 1. No acute traumatic injury. 2. Status post interval decompression laminectomies C3 through C6 and bilateral posterolateral spinal fusion extending from C2 through T2. 3. Mild-to-moderate diffuse chronic degenerative disc disease throughout the cervical spine. Electronically signed by: Evalene Coho MD 02/07/2024 07:19 AM EDT RP Workstation: [id number]   CT Head Wo Contrast Result Date: 02/07/2024 EXAM: CT HEAD WITHOUT CONTRAST 02/07/2024 06:35:00 AM TECHNIQUE: CT of the head was performed without the administration of intravenous contrast. Automated exposure control, iterative reconstruction, and/or weight based adjustment of the mA/kV was utilized to reduce the radiation dose to as low as reasonably achievable. COMPARISON: CT angiogram of the head and neck dated 09/05/2022. CLINICAL HISTORY: Polytrauma, blunt. Chief complaints; Fall on Thinners; CT Head Wo Contrast; Polytrauma, Blunt; TRAUMA FINDINGS: BRAIN AND VENTRICLES: No acute hemorrhage. No evidence of acute infarct. 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 Atrophy and chronic small vessel disease. No hydrocephalus. No extra-axial collection. No mass effect or midline shift. ORBITS: No acute abnormality. SINUSES: No acute abnormality. SOFT TISSUES AND SKULL: No acute soft tissue abnormality. No skull fracture. IMPRESSION: 1. No acute intracranial abnormality. 2. 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 3. Atrophy and chronic small vessel disease. Electronically signed by: Evalene Coho MD 02/07/2024 07:15 AM EDT RP Workstation: [id number]   DG Chest Portable 1 View Result Date: 02/07/2024 EXAM: 1 VIEW XRAY OF THE CHEST 02/07/2024 06:09:18 AM  COMPARISON: PA lateral chest 04/24/2023. CLINICAL HISTORY: Fall etc. Trauma level 2/ fall on thinners; syncopal episode while standing from his computer. FINDINGS: LUNGS AND PLEURA: No focal pulmonary opacity. No pulmonary edema. No pleural effusion. No pneumothorax. HEART AND MEDIASTINUM: Mild cardiomegaly. Stable mediastinum with aortic atherosclerosis. BONES AND SOFT TISSUES: Dorsal fusion hardware is partially visible at the upper 2 thoracic spine levels. There is thoracic spondylosis. Moderate bilateral shoulder arthrosis and osteopenia. IMPRESSION: 1. No acute cardiopulmonary process. 2. Stable chest with Mild cardiomegaly. 3. Osteopenia and degenerative change. Electronically signed by: Francis Quam MD 02/07/2024 06:37 AM EDT RP Workstation: [id number]   DG Pelvis Portable Result Date: 02/07/2024 EXAM: 1 or 2 VIEW(S) XRAY OF THE PELVIS 02/07/2024 06:10:07 AM COMPARISON: Chest, abdomen, and pelvis CT 03/19/2022. CLINICAL HISTORY: Fall etc. Trauma level 2/ fall on thinners; syncopal episode while standing from his computer. FINDINGS: BONES AND JOINTS: There is an acute transverse oblique basicervical proximal left femoral fracture with impaction at the fracture site and Ritta Hammes mild varus angulation. There is osteopenia without other evidence for fractures including the bony pelvis and proximal right femur. No focal osseous lesion. No joint dislocation. There is mild arthrosis at the SI joints, both hips, and enthesopathic change of the pelvis. Advanced degenerative change in the visualized lower lumbar spine.  SOFT TISSUES: There is extensive iliofemoral calcific arteriosclerosis. IMPRESSION: 1. Acute transverse oblique basicervical proximal left femoral fracture with impaction and mild varus angulation. 2. Advanced degenerative change in the visualized lower lumbar spine. 3. Extensive iliofemoral calcific arteriosclerosis. Electronically signed by: Francis Quam MD 02/07/2024 06:35 AM EDT RP Workstation:  [id number]   DG Elbow Complete Left Result Date: 02/07/2024 EXAM: 3 VIEW(S) XRAY OF THE LEFT ELBOW COMPARISON: None available. CLINICAL HISTORY: Fall on thinners; syncopal episode while standing from his computer. FINDINGS: BONES AND JOINTS: Osteopenia. There is an acute oblique, minimally distracted and otherwise nondisplaced intraarticular comminuted fracture of the posterior aspect of the proximal ulna. There are no further displaced fractures. There is endosteal scalloping in the distal humerus and in the visualized portions of the radius and ulna. In this person's age group this may be seen with multiple myeloma, anemias, skeletal amyloidosis, and bone metastases. It is possible this could be Larissa Pegg pathologic fracture, but the endosteal scalloping for the most part appears limited to the shafts of these bones. Clinical correlation is needed. Positive for displaced fat pad signs consistent with joint effusion. No joint dislocation. SOFT TISSUES: Moderate dorsal soft tissue swelling extends over the upper forearm. IMPRESSION: 1. Acute oblique, minimally distracted, otherwise nondisplaced intraarticular comminuted fracture of the posterior aspect of the proximal ulna. 2. Joint effusion with displaced fat pad signs. 3. Moderate dorsal soft tissue swelling extending over the upper forearm. 4. Osteopenia with endosteal scalloping in the distal humerus and visualized portions of the radius and ulna; concerning for potential pathologic process such as plasma cell dyscrasia, skeletal amyloidosis or metastatic disease; recommend appropriate hematologic/oncologic evaluation and follow-up imaging as indicated. According to reports of prior studies, he does have Khaliya Golinski history of mgus. Electronically signed by: Francis Quam MD 02/07/2024 06:30 AM EDT RP Workstation: [id number]        Scheduled Meds:  [FJM Hold] acetaminophen   1,000 mg Oral TID   [MAR Hold] atorvastatin   10 mg Oral QHS   [MAR Hold] escitalopram   10 mg  Oral Daily   [MAR Hold] gabapentin  300 mg Oral TID   [MAR Hold] hydrochlorothiazide   12.5 mg Oral Daily   [MAR Hold] insulin  aspart  0-15 Units Subcutaneous TID WC   [MAR Hold] losartan   100 mg Oral Daily   [MAR Hold] memantine   5 mg Oral BID   [MAR Hold] methocarbamol  500 mg Oral TID   Continuous Infusions:  sodium chloride  Stopped (02/08/24 1027)   lactated ringers Stopped (02/08/24 1434)     LOS: 1 day    Time spent: over 30 min     Meliton Monte, MD [facility]   To contact the attending provider between 7A-7P or the covering provider during after hours 7P-7A, please log into the web site [email or web address] and access using universal [place] password for that web site. If you do not have the password, please call the hospital operator.  02/08/2024, 3:33 PM

## 2024-02-08 NOTE — Progress Notes (Signed)
  Echocardiogram 2D Echocardiogram has been attempted. Pt going to surgery  Tinnie FORBES Gosling  RDCS 02/08/2024, 10:49 AM

## 2024-02-08 NOTE — Progress Notes (Signed)
 Orthopedic Tech Progress Note Patient Details:  Dennis Wise [birth year] [id number]  Musculoskeletal Traction Type of Traction: Bucks Skin Traction Traction Location: LLE Traction Weight: 10 lbs   Post Interventions Patient Tolerated: Well Instructions Provided: Care of device  Delanna LITTIE Pac 02/08/2024, 4:33 PM

## 2024-02-08 NOTE — Progress Notes (Signed)
 PT Cancellation Note  Patient Details Name: Dennis Wise MRN: [id number] DOB: 06/06/1950   Cancelled Treatment:    Reason Eval/Treat Not Completed: Patient not medically ready (pt to have ORIF L elbow and hip). Will await surgery and post op orders.   Richerd Lipoma, PT  Acute Rehab Services Secure chat preferred Office [phone]    Richerd LITTIE Lipoma 02/08/2024, 8:36 AM

## 2024-02-09 ENCOUNTER — Inpatient Hospital Stay (HOSPITAL_COMMUNITY)

## 2024-02-09 DIAGNOSIS — R55 Syncope and collapse: Secondary | ICD-10-CM

## 2024-02-09 DIAGNOSIS — S72002A Fracture of unspecified part of neck of left femur, initial encounter for closed fracture: Secondary | ICD-10-CM | POA: Diagnosis not present

## 2024-02-09 LAB — ECHOCARDIOGRAM COMPLETE
AR max vel: 1.95 cm2
AV Peak grad: 15.2 mmHg
Ao pk vel: 1.95 m/s
Area-P 1/2: 3.17 cm2
Height: 74 in
S' Lateral: 3.1 cm
Weight: 3728.42 [oz_av]

## 2024-02-09 LAB — CBC WITH DIFFERENTIAL/PLATELET
Abs Immature Granulocytes: 0.03 K/uL (ref 0.00–0.07)
Basophils Absolute: 0 K/uL (ref 0.0–0.1)
Basophils Relative: 0 %
Eosinophils Absolute: 0 K/uL (ref 0.0–0.5)
Eosinophils Relative: 1 %
HCT: 24 % — ABNORMAL LOW (ref 39.0–52.0)
Hemoglobin: 8 g/dL — ABNORMAL LOW (ref 13.0–17.0)
Immature Granulocytes: 1 %
Lymphocytes Relative: 17 %
Lymphs Abs: 1 K/uL (ref 0.7–4.0)
MCH: 29.3 pg (ref 26.0–34.0)
MCHC: 33.3 g/dL (ref 30.0–36.0)
MCV: 87.9 fL (ref 80.0–100.0)
Monocytes Absolute: 0.7 K/uL (ref 0.1–1.0)
Monocytes Relative: 12 %
Neutro Abs: 4 K/uL (ref 1.7–7.7)
Neutrophils Relative %: 69 %
Platelets: 94 K/uL — ABNORMAL LOW (ref 150–400)
RBC: 2.73 MIL/uL — ABNORMAL LOW (ref 4.22–5.81)
RDW: 15.1 % (ref 11.5–15.5)
WBC: 5.7 K/uL (ref 4.0–10.5)
nRBC: 0 % (ref 0.0–0.2)

## 2024-02-09 LAB — COMPREHENSIVE METABOLIC PANEL WITH GFR
ALT: 10 U/L (ref 0–44)
AST: 16 U/L (ref 15–41)
Albumin: 2.8 g/dL — ABNORMAL LOW (ref 3.5–5.0)
Alkaline Phosphatase: 86 U/L (ref 38–126)
Anion gap: 10 (ref 5–15)
BUN: 48 mg/dL — ABNORMAL HIGH (ref 8–23)
CO2: 22 mmol/L (ref 22–32)
Calcium: 8.3 mg/dL — ABNORMAL LOW (ref 8.9–10.3)
Chloride: 102 mmol/L (ref 98–111)
Creatinine, Ser: 1.67 mg/dL — ABNORMAL HIGH (ref 0.61–1.24)
GFR, Estimated: 42 mL/min — ABNORMAL LOW (ref >60)
Glucose, Bld: 314 mg/dL — ABNORMAL HIGH (ref 70–99)
Potassium: 4.5 mmol/L (ref 3.5–5.1)
Sodium: 134 mmol/L — ABNORMAL LOW (ref 135–145)
Total Bilirubin: 0.7 mg/dL (ref 0.0–1.2)
Total Protein: 5.6 g/dL — ABNORMAL LOW (ref 6.5–8.1)

## 2024-02-09 LAB — GLUCOSE, CAPILLARY
Glucose-Capillary: 308 mg/dL — ABNORMAL HIGH (ref 70–99)
Glucose-Capillary: 277 mg/dL — ABNORMAL HIGH (ref 70–99)
Glucose-Capillary: 255 mg/dL — ABNORMAL HIGH (ref 70–99)
Glucose-Capillary: 236 mg/dL — ABNORMAL HIGH (ref 70–99)
Glucose-Capillary: 166 mg/dL — ABNORMAL HIGH (ref 70–99)

## 2024-02-09 LAB — ABO/RH: ABO/RH(D): B POS

## 2024-02-09 LAB — PREPARE RBC (CROSSMATCH)

## 2024-02-09 LAB — PHOSPHORUS: Phosphorus: 3.9 mg/dL (ref 2.5–4.6)

## 2024-02-09 LAB — HEMOGLOBIN AND HEMATOCRIT, BLOOD
HCT: 23.3 % — ABNORMAL LOW (ref 39.0–52.0)
Hemoglobin: 7.7 g/dL — ABNORMAL LOW (ref 13.0–17.0)

## 2024-02-09 LAB — MAGNESIUM: Magnesium: 1.7 mg/dL (ref 1.7–2.4)

## 2024-02-09 MED ORDER — FUROSEMIDE 10 MG/ML IJ SOLN
20.0000 mg | Freq: Once | INTRAMUSCULAR | Status: AC
  Administered 2024-02-09: 20 mg via INTRAVENOUS
  Filled 2024-02-09: qty 2

## 2024-02-09 MED ORDER — DIPHENHYDRAMINE HCL 25 MG PO CAPS
25.0000 mg | ORAL_CAPSULE | Freq: Once | ORAL | Status: AC
  Administered 2024-02-09: 25 mg via ORAL
  Filled 2024-02-09: qty 1

## 2024-02-09 MED ORDER — CEFAZOLIN SODIUM-DEXTROSE 2-4 GM/100ML-% IV SOLN: 2.0000 g | INTRAVENOUS | Status: DC

## 2024-02-09 MED ORDER — ACETAMINOPHEN 325 MG PO TABS
650.0000 mg | ORAL_TABLET | Freq: Once | ORAL | Status: AC
  Administered 2024-02-09: 650 mg via ORAL
  Filled 2024-02-09: qty 2

## 2024-02-09 MED ORDER — SODIUM CHLORIDE 0.9% IV SOLUTION: Freq: Once | INTRAVENOUS | Status: AC

## 2024-02-09 MED ORDER — INSULIN ASPART 100 UNIT/ML IJ SOLN
2.0000 [IU] | Freq: Three times a day (TID) | INTRAMUSCULAR | Status: DC
  Administered 2024-02-09 – 2024-02-17 (×20): 2 [IU] via SUBCUTANEOUS

## 2024-02-09 NOTE — Progress Notes (Signed)
 [facility] Progress Note  Patient ID: Dennis Wise MRN: [id number] DOB/AGE: 22-Jan-1951 73 y.o.  Subjective:  Doing ok L elbow only hurts when he moves his arm.  He is splinted Bucks traction to left leg  Plan for OR tomorrow with Dr. Edna for L THA  Dr. Romona eval pending for R hand   ROS As above  Today's  total administered Morphine Milligram Equivalents: 22.5 Yesterday's total administered Morphine Milligram Equivalents: 75  Objective:   VITALS:   Vitals:   02/08/24 2027 02/09/24 0500 02/09/24 0815 02/09/24 1444  BP: (!) 145/51 (!) 158/50 (!) 149/56 (!) 112/47  Pulse: 62 (!) 52 (!) 49 (!) 52  Resp: 15 16 16 16   Temp: 98.7 F (37.1 C) 97.6 F (36.4 C) 98.1 F (36.7 C) 97.8 F (36.6 C)  TempSrc:  Oral Oral Oral  SpO2: 99% 95% 95% (!) 89%  Weight:      Height:        Estimated body mass index is 29.92 kg/m as calculated from the following:   Height as of this encounter: 6' 2 (1.88 m).   Weight as of this encounter: 105.7 kg.   Intake/Output      10/20 0701 10/21 0700 10/21 0701 10/22 0700   P.O. 360 240   I.V. (mL/kg) 888.6 (8.4)    IV Piggyback 100    Total Intake(mL/kg) 1348.6 (12.8) 240 (2.3)   Urine (mL/kg/hr) 1650 (0.7) 250 (0.2)   Blood 50    Total Output 1700 250   Net -351.4 -10          LABS  Results for orders placed or performed during the hospital encounter of 02/07/24 (from the past 24 hours)  Glucose, capillary     Status: Abnormal   Collection Time: 02/08/24  5:08 PM  Result Value Ref Range   Glucose-Capillary 168 (H) 70 - 99 mg/dL   Comment 1 Notify RN   Urinalysis, Routine w reflex microscopic -Urine, Clean Catch     Status: Abnormal   Collection Time: 02/08/24  9:16 PM  Result Value Ref Range   Color, Urine YELLOW YELLOW   APPearance CLEAR CLEAR   Specific Gravity, Urine 1.015 1.005 - 1.030   pH 5.0 5.0 - 8.0   Glucose, UA  150 (A) NEGATIVE mg/dL   Hgb urine dipstick NEGATIVE NEGATIVE   Bilirubin Urine NEGATIVE NEGATIVE   Ketones, ur NEGATIVE NEGATIVE mg/dL   Protein, ur 30 (A) NEGATIVE mg/dL   Nitrite NEGATIVE NEGATIVE   Leukocytes,Ua NEGATIVE NEGATIVE   RBC / HPF 0-5 0 - 5 RBC/hpf   WBC, UA 0-5 0 - 5 WBC/hpf   Bacteria, UA NONE SEEN NONE SEEN   Squamous Epithelial / HPF 0-5 0 - 5 /HPF   Mucus PRESENT    Hyaline Casts, UA PRESENT    Non Squamous Epithelial 0-5 (A) NONE SEEN  Glucose, capillary     Status: Abnormal   Collection Time: 02/08/24  9:18 PM  Result Value Ref Range   Glucose-Capillary 288 (H) 70 - 99 mg/dL  CBC with Differential/Platelet     Status: Abnormal   Collection Time: 02/09/24  4:54 AM  Result Value Ref Range   WBC 5.7 4.0 - 10.5 K/uL   RBC 2.73 (L) 4.22 - 5.81 MIL/uL   Hemoglobin 8.0 (  L) 13.0 - 17.0 g/dL   HCT 75.9 (L) 60.9 - 47.9 %   MCV 87.9 80.0 - 100.0 fL   MCH 29.3 26.0 - 34.0 pg   MCHC 33.3 30.0 - 36.0 g/dL   RDW 84.8 88.4 - 84.4 %   Platelets 94 (L) 150 - 400 K/uL   nRBC 0.0 0.0 - 0.2 %   Neutrophils Relative % 69 %   Neutro Abs 4.0 1.7 - 7.7 K/uL   Lymphocytes Relative 17 %   Lymphs Abs 1.0 0.7 - 4.0 K/uL   Monocytes Relative 12 %   Monocytes Absolute 0.7 0.1 - 1.0 K/uL   Eosinophils Relative 1 %   Eosinophils Absolute 0.0 0.0 - 0.5 K/uL   Basophils Relative 0 %   Basophils Absolute 0.0 0.0 - 0.1 K/uL   Immature Granulocytes 1 %   Abs Immature Granulocytes 0.03 0.00 - 0.07 K/uL  Comprehensive metabolic panel with GFR     Status: Abnormal   Collection Time: 02/09/24  4:54 AM  Result Value Ref Range   Sodium 134 (L) 135 - 145 mmol/L   Potassium 4.5 3.5 - 5.1 mmol/L   Chloride 102 98 - 111 mmol/L   CO2 22 22 - 32 mmol/L   Glucose, Bld 314 (H) 70 - 99 mg/dL   BUN 48 (H) 8 - 23 mg/dL   Creatinine, Ser 8.32 (H) 0.61 - 1.24 mg/dL   Calcium  8.3 (L) 8.9 - 10.3 mg/dL   Total Protein 5.6 (L) 6.5 - 8.1 g/dL   Albumin 2.8 (L) 3.5 - 5.0 g/dL   AST 16 15 - 41 U/L    ALT 10 0 - 44 U/L   Alkaline Phosphatase 86 38 - 126 U/L   Total Bilirubin 0.7 0.0 - 1.2 mg/dL   GFR, Estimated 42 (L) >60 mL/min   Anion gap 10 5 - 15  Magnesium     Status: None   Collection Time: 02/09/24  4:54 AM  Result Value Ref Range   Magnesium 1.7 1.7 - 2.4 mg/dL  Phosphorus     Status: None   Collection Time: 02/09/24  4:54 AM  Result Value Ref Range   Phosphorus 3.9 2.5 - 4.6 mg/dL  Glucose, capillary     Status: Abnormal   Collection Time: 02/09/24  5:58 AM  Result Value Ref Range   Glucose-Capillary 308 (H) 70 - 99 mg/dL  Glucose, capillary     Status: Abnormal   Collection Time: 02/09/24  8:12 AM  Result Value Ref Range   Glucose-Capillary 277 (H) 70 - 99 mg/dL  Prepare RBC (crossmatch)     Status: None   Collection Time: 02/09/24  9:47 AM  Result Value Ref Range   Order Confirmation      ORDER PROCESSED BY BLOOD BANK Performed at [facility], [address] [place], [place] [address]   Type and screen [facility]     Status: None (Preliminary result)   Collection Time: 02/09/24  9:47 AM  Result Value Ref Range   ABO/RH(D) B POS    Antibody Screen NEG    Sample Expiration 02/12/2024,2359    Unit Number [id number]    Blood Component Type RED CELLS,LR    Unit division 00    Status of Unit ALLOCATED    Transfusion Status OK TO TRANSFUSE    Crossmatch Result      Compatible Performed at [facility], [address] [place], [place] [address]  Unit Number [id number]    Blood Component Type RED CELLS,LR    Unit division 00    Status of Unit ALLOCATED    Transfusion Status OK TO TRANSFUSE    Crossmatch Result Compatible   Hemoglobin and hematocrit, blood     Status: Abnormal   Collection Time: 02/09/24 11:03 AM  Result Value Ref Range   Hemoglobin 7.7 (L) 13.0 - 17.0 g/dL   HCT 76.6 (L) 60.9 - 47.9 %  Glucose, capillary     Status: Abnormal   Collection Time: 02/09/24 11:50 AM  Result Value Ref Range    Glucose-Capillary 255 (H) 70 - 99 mg/dL     PHYSICAL EXAM:   Gen: resting comfortably in bed, family at bedside, NAD  Lungs: unlabored  Ext:       Left Upper Extremity   LAS fitting well  Ext warm   Radial, ulnar, median nv motor and sensory functions intact  Brisk cap refill   Swelling mild         Left Lower Extremity   Bucks traction in place  Motor and sensory functions grossly intact  Ext warm   + DP pulse    Assessment/Plan: 1 Day Post-Op   Principal Problem:   Closed left hip fracture (HCC)   Anti-infectives (From admission, onward)    Start     Dose/Rate Route Frequency Ordered Stop   02/08/24 2100  ceFAZolin (ANCEF) IVPB 2g/100 mL premix        2 g 200 mL/hr over 30 Minutes Intravenous Every 8 hours 02/08/24 1545 02/09/24 1444   02/08/24 1100  ceFAZolin (ANCEF) IVPB 2g/100 mL premix        2 g 200 mL/hr over 30 Minutes Intravenous On call to O.R. 02/08/24 1009 02/08/24 1257     .  POD/HD#: 33  73 year old male complex medical history with acute closed comminuted left proximal femur fracture, closed left olecranon fracture, closed right second metacarpal neck fracture   - Fall   -Multiple orthopedic injuries               Highly comminuted left proximal femur fracture including neck and intertrochanteric region               Closed left olecranon fracture               Closed right second metacarpal neck fracture                                            Closed L olecranon fracture s/p ORIF  NWB L elbow Ok to use platform walker Unrestricted ROM digits, wrist and shoulder No elbow motion as he is splinted Splint to remain on x 2 weeks  Start passive extension in 2 weeks Active extension against gravity in 6 weeks Active extension against resistance in 8 weeks Ice and elevate         Left proximal femur fracture OR tomorrow with Dr. Edna         R 2nd metacarpal fracture   Per Dr. Romona   - Pain management:                Multimodal  - ABL anemia/Hemodynamics               Hgb/Hct 8.0/24 on initial am labs, repeat 7.7/23.3    2 units PRBCs ordered to  be given today in preparation of THA surgery tomorrow.  Orders placed at 0900, transfusion still has not taken place  - Medical issues                per primary  - DVT/PE prophylaxis:               On hold including his Plavix     Defer pharmacologics to team performing THA  - ID:                Perioperative antibiotics  - Metabolic Bone Disease:               Vitamin D is in the insufficient range                             Supplement - Activity:               Bedrest for now               Commence therapies once all surgeries are completed   - FEN/GI prophylaxis/Foley/Lines:               NPO after MN     - Impediments to fracture healing:               Vitamin D insufficiency               Suspect some degree of osteoporosis/osteopenia would recommend DEXA in the near future                - Dispo:               L olecranon addressed     L THA tomorrow at [facility]    R hand eval pending    Follow up with ortho trauma for L olecranon in 2 weeks   Francis MICAEL Mt, PA-C [phone] (C) 02/09/2024, 4:43 PM  [facility] [address] [place] [address] [phone] (F)    After 5pm and on the weekends please log on to Amion, go to orthopaedics and the look under the [facility] Call for the provider(s) on call. You can also call our office at [phone] and then follow the prompts to be connected to the call team.  Patient ID: Dennis Wise, male   DOB: 29-Apr-1950, 73 y.o.   MRN: [id number]

## 2024-02-09 NOTE — Progress Notes (Signed)
 PROGRESS NOTE    KEYSTON Wise  FMW:[id number] DOB: 02/16/1951 DOA: 02/07/2024 PCP: [name], MD  Chief Complaint  Patient presents with   Dennis Wise    Brief Narrative:   14M h/o MGUS, NAFLD, ischemic R PCA stroke in 2024, HTN, and DM2 p/w GLF c/b L hip and L elbow fractures iso syncope c/b AKI.   Now s/p surgery for L elbow fracture (op note pending).   Plan for transfer to [facility] for surgery for his hip fracture on 10/22.  Plan for R distal second metacarpal fx is pending.  Assessment & Plan:   Principal Problem:   Closed left hip fracture (HCC)  L hip fracture L elbow fracture R Distal Second Metacarpal Fracture Fragility fracture Forehead Lac Possible pathologic fracture give h/o MGUS -plain films of L elbow with osteopenia with endosteal scalloping in the distal humerus and visualized portions of the radius and ulna (concerning for pathologic process) - intraarticular fracture of proximal ulna, angulated appearance of radial neck (fx not excluded) - CT L hip with comminuted fracture of the L femoral neck and intertrochanteric region with mild varus angulation  - no acute injury to C spine - s/p decompression laminectomies C3-C6 and bilateral posterolateral spinal fusion from C2 through T2 - age indeterminate angulated fx of distal 2nd metacarpal -Orthopedic surgery consulted; apprec eval/recs - they're working on coordinating his trips to the OR -s/p surgery with orthopedics 10/20 for LUE, op note pending -plan is for surgery for hip fracture 10/22 - plan for R distal second metacarpal fx pending -forehead lac repaired with tissue adhesive  -F/u 25-OH vitamin D - low, supplement  -follow outpatient with PCP to consider bisphosphonate -Multimodal pain control with tylenol  TID, robaxin TID, gabapentin TID, and oxycodone 5mg  q4h prn  AKI on CKD IIIa Baseline creatinine 1.3 Creatinine on admission 1.7 Relatively stable Continue to monitor  Follow UA (0-5  RBC, 30 mg/dl protein), if not improving, follow renal US   Syncope -PT/OT consulted; apprec eval/recs -orthostatic vitals when able (post op) - based on patient report, new BP med [name] few weeks ago and syncope after change in positions - continue to hold hydrochlorothiazide .  Hold losartan  for now. - telemetry monitoring -blood cultures pending -EtOH level negative -EEG without seizure or epileptiform discharges (right temporooccipital cortical dysfunction likely secondary to underlying structural abnromality/stroke) -F/u TTE to eval for structural/valvular heart disease -Consider cardiac event monitor   Post Op Anemia -getting 2 units pRBC today per orthopedics - follow post transfusion H/H  Hx MGUS - needs heme onc follow up outpatient   Recent C2-T2 Fusion - ortho reached out to PA's for Dr. Dow - per 10/10 note, was to continue hard collar another 3 weeks until follow up with Dr. Dow   Hx Stroke Old R temporooccipital infarct with encephalomalacia and ex vacuo dilation of R lateral ventricle Statin, plavix  on hold   T2DM Glimeperide and actos  and metformin  on hold A1c 6.3 SSI  HTN -holding home losartan  100mg  daily and hydrochlorothiazide  12.5mg  daily for now, will reevaluate resumption  Dyslipidemia - niacin  on hold - statin   Mood disorder -PTA Lexapro     DVT prophylaxis: SCD Code Status: full Family Communication: none Disposition:   Status is: Inpatient Remains inpatient appropriate because: need for continued inpatient admissions   Consultants:  orthopedics  Procedures:  Op note pending  Antimicrobials:  Anti-infectives (From admission, onward)    Start     Dose/Rate Route Frequency Ordered Stop  02/08/24 2100  ceFAZolin (ANCEF) IVPB 2g/100 mL premix        2 g 200 mL/hr over 30 Minutes Intravenous Every 8 hours 02/08/24 1545 02/09/24 2159   02/08/24 1100  ceFAZolin (ANCEF) IVPB 2g/100 mL premix        2 g 200 mL/hr over 30 Minutes  Intravenous On call to O.R. 02/08/24 1009 02/08/24 1257       Subjective: Wife at bedside Friend at bedside Notes pain with movement in bed  Objective: Vitals:   02/08/24 1609 02/08/24 2027 02/09/24 0500 02/09/24 0815  BP: 127/88 (!) 145/51 (!) 158/50 (!) 149/56  Pulse: (!) 105 62 (!) 52 (!) 49  Resp: 10 15 16 16   Temp: 98.7 F (37.1 C) 98.7 F (37.1 C) 97.6 F (36.4 C) 98.1 F (36.7 C)  TempSrc: Oral  Oral Oral  SpO2: 96% 99% 95% 95%  Weight:      Height:        Intake/Output Summary (Last 24 hours) at 02/09/2024 1407 Last data filed at 02/09/2024 0900 Gross per 24 hour  Intake 1455.25 ml  Output 1550 ml  Net -94.75 ml   Filed Weights   02/07/24 0622 02/08/24 1047  Weight: 105.7 kg 105.7 kg    Examination:  General: No acute distress. Cardiovascular: RRR Lungs: unlabored Abdomen: Soft, nontender, nondistended Neurological: Alert and oriented 3. Moves all extremities 4 with equal strength. Cranial nerves II through XII grossly intact. Extremities: LLE in traction, LUE in splint - bruising and swelling to R hand    Data Reviewed: I have personally reviewed following labs and imaging studies  CBC: Recent Labs  Lab 02/04/24 0911 02/07/24 0542 02/07/24 0546 02/08/24 1031 02/09/24 0454 02/09/24 1103  WBC 3.6* 6.0  --  5.4 5.7  --   NEUTROABS 1.9 4.3  --  3.6 4.0  --   HGB 11.0* 9.3* 9.2* 8.4* 8.0* 7.7*  HCT 33.8* 28.0* 27.0* 25.1* 24.0* 23.3*  MCV 87.8 89.2  --  87.8 87.9  --   PLT 95* 102*  --  100* 94*  --     Basic Metabolic Panel: Recent Labs  Lab 02/04/24 0911 02/07/24 0546 02/08/24 0705 02/09/24 0454  NA 134* 136 135 134*  K 4.2 3.8 4.0 4.5  CL 98 100 103 102  CO2 26  --  23 22  GLUCOSE 123* 249* 181* 314*  BUN 37* 41* 46* 48*  CREATININE 1.71* 1.70* 1.70* 1.67*  CALCIUM  9.1  --  8.4* 8.3*  MG  --   --   --  1.7  PHOS  --   --   --  3.9    GFR: Estimated Creatinine Clearance: 49.5 mL/min (Rocco Kerkhoff) (by C-G formula based on SCr of  1.67 mg/dL (H)).  Liver Function Tests: Recent Labs  Lab 02/04/24 0911 02/09/24 0454  AST 17 16  ALT 11 10  ALKPHOS 131* 86  BILITOT 0.8 0.7  PROT 7.1 5.6*  ALBUMIN 4.0 2.8*    CBG: Recent Labs  Lab 02/08/24 1708 02/08/24 2118 02/09/24 0558 02/09/24 0812 02/09/24 1150  GLUCAP 168* 288* 308* 277* 255*     Recent Results (from the past 240 hours)  Culture, blood (Routine X 2) w Reflex to ID Panel     Status: None (Preliminary result)   Collection Time: 02/07/24  9:12 AM   Specimen: BLOOD RIGHT HAND  Result Value Ref Range Status   Specimen Description BLOOD RIGHT HAND  Final   Special Requests  Final    BOTTLES DRAWN AEROBIC ONLY Blood Culture results may not be optimal due to an inadequate volume of blood received in culture bottles   Culture   Final    NO GROWTH 2 DAYS Performed at [facility], [address] [place], [place] [address]    Report Status PENDING  Incomplete  Culture, blood (Routine X 2) w Reflex to ID Panel     Status: None (Preliminary result)   Collection Time: 02/07/24 10:13 PM   Specimen: BLOOD  Result Value Ref Range Status   Specimen Description BLOOD SITE NOT SPECIFIED  Final   Special Requests   Final    BOTTLES DRAWN AEROBIC AND ANAEROBIC Blood Culture adequate volume   Culture   Final    NO GROWTH 2 DAYS Performed at [facility], [address], [place], [place] [address]    Report Status PENDING  Incomplete         Radiology Studies: ECHOCARDIOGRAM COMPLETE Result Date: 02/09/2024    ECHOCARDIOGRAM REPORT   Patient Name:   BELAL SCALLON [facility] Date of Exam: 02/09/2024 Medical Rec #:  [id number]    Height:       74.0 in Accession #:    [id number]   Weight:       233.0 lb Date of Birth:  [birth year]    BSA:          2.319 m Patient Age:    73 years     BP:           158/50 mmHg Patient Gender: M            HR:           49 bpm. Exam Location:  Inpatient Procedure: 2D Echo, Cardiac Doppler and Color Doppler (Both Spectral and  Color            Flow Doppler were utilized during procedure). Indications:    Syncope R55  History:        Patient has prior history of Echocardiogram examinations, most                 recent 09/07/2022. Stroke, Signs/Symptoms:Syncope; Risk                 Factors:Hypertension and Diabetes.  Sonographer:    Thea Norlander RCS Referring Phys: [phone] KEITH PAUL IMPRESSIONS  1. Left ventricular ejection fraction, by estimation, is 65 to 70%. The left ventricle has normal function. The left ventricle has no regional wall motion abnormalities. Left ventricular diastolic parameters were normal.  2. Right ventricular systolic function is normal. The right ventricular size is normal. There is normal pulmonary artery systolic pressure.  3. Left atrial size was mildly dilated.  4. The mitral valve is normal in structure. Trivial mitral valve regurgitation. No evidence of mitral stenosis.  5. The aortic valve is tricuspid. There is mild calcification of the aortic valve. Aortic valve regurgitation is not visualized. Aortic valve sclerosis/calcification is present, without any evidence of aortic stenosis.  6. The inferior vena cava is dilated in size with >50% respiratory variability, suggesting right atrial pressure of 8 mmHg. Comparison(s): No significant change from prior study. Prior images reviewed side by side. Throughout the study the rhythm was sinus bradycardia, 45-50 bpm. FINDINGS  Left Ventricle: Left ventricular ejection fraction, by estimation, is 65 to 70%. The left ventricle has normal function. The left ventricle has no regional wall motion abnormalities. The left ventricular internal cavity size was  normal in size. There is  no concentric left ventricular hypertrophy. Left ventricular diastolic parameters were normal. Right Ventricle: The right ventricular size is normal. No increase in right ventricular wall thickness. Right ventricular systolic function is normal. There is normal pulmonary artery systolic  pressure. The tricuspid regurgitant velocity is 2.51 m/s, and  with an assumed right atrial pressure of 8 mmHg, the estimated right ventricular systolic pressure is 33.2 mmHg. Left Atrium: Left atrial size was mildly dilated. Right Atrium: Right atrial size was normal in size. Pericardium: There is no evidence of pericardial effusion. Mitral Valve: The mitral valve is normal in structure. Trivial mitral valve regurgitation. No evidence of mitral valve stenosis. Tricuspid Valve: The tricuspid valve is normal in structure. Tricuspid valve regurgitation is mild. Aortic Valve: The aortic valve is tricuspid. There is mild calcification of the aortic valve. Aortic valve regurgitation is not visualized. Aortic valve sclerosis/calcification is present, without any evidence of aortic stenosis. Aortic valve peak gradient measures 15.2 mmHg. Pulmonic Valve: The pulmonic valve was normal in structure. Pulmonic valve regurgitation is not visualized. No evidence of pulmonic stenosis. Aorta: The aortic root and ascending aorta are structurally normal, with no evidence of dilitation. Venous: The inferior vena cava is dilated in size with greater than 50% respiratory variability, suggesting right atrial pressure of 8 mmHg. IAS/Shunts: No atrial level shunt detected by color flow Doppler.  LEFT VENTRICLE PLAX 2D LVIDd:         5.20 cm   Diastology LVIDs:         3.10 cm   LV e' medial:    8.55 cm/s LV PW:         1.00 cm   LV E/e' medial:  11.3 LV IVS:        1.00 cm   LV e' lateral:   10.30 cm/s LVOT diam:     2.00 cm   LV E/e' lateral: 9.4 LV SV:         100 LV SV Index:   43 LVOT Area:     3.14 cm LV IVRT:       46 msec  RIGHT VENTRICLE             IVC RV S prime:     13.40 cm/s  IVC diam: 3.00 cm TAPSE (M-mode): 2.5 cm LEFT ATRIUM             Index        RIGHT ATRIUM           Index LA diam:        4.60 cm 1.98 cm/m   RA Area:     16.20 cm LA Vol (A2C):   45.8 ml 19.75 ml/m  RA Volume:   37.00 ml  15.95 ml/m LA Vol (A4C):    85.5 ml 36.86 ml/m LA Biplane Vol: 65.3 ml 28.15 ml/m  AORTIC VALVE AV Area (Vmax): 1.95 cm AV Vmax:        195.00 cm/s AV Peak Grad:   15.2 mmHg LVOT Vmax:      121.00 cm/s LVOT Vmean:     85.300 cm/s LVOT VTI:       0.317 m  AORTA Ao Root diam: 3.40 cm Ao Asc diam:  3.60 cm MITRAL VALVE               TRICUSPID VALVE MV Area (PHT): 3.17 cm    TR Peak grad:   25.2 mmHg MV Decel Time: 239 msec  TR Vmax:        251.00 cm/s MV E velocity: 96.40 cm/s MV Laurell Coalson velocity: 58.50 cm/s  SHUNTS MV E/Taisei Bonnette ratio:  1.65        Systemic VTI:  0.32 m                            Systemic Diam: 2.00 cm Jerel Balding MD Electronically signed by Jerel Balding MD Signature Date/Time: 02/09/2024/12:31:04 PM    Final    DG Elbow 2 Views Left Result Date: 02/08/2024 CLINICAL DATA:  Fracture, postop. EXAM: LEFT ELBOW - 2 VIEW COMPARISON:  Preoperative imaging FINDINGS: Plate and screw fixation of olecranon fracture. Fracture is in anatomic alignment. Overlying splint material limits osseous and soft tissue fine detail. IMPRESSION: Plate and screw fixation of olecranon fracture in anatomic alignment. Electronically Signed   By: Andrea Gasman M.D.   On: 02/08/2024 15:56   DG ELBOW COMPLETE LEFT (3+VIEW) Result Date: 02/08/2024 CLINICAL DATA:  Elective surgery. EXAM: LEFT ELBOW - COMPLETE 3+ VIEW COMPARISON:  Preoperative imaging FINDINGS: Two fluoroscopic spot views of the elbow submitted from the operating room. Plate and screw fixation of olecranon fracture. Fluoroscopy time 14.5 seconds. Dose 0.57 mGy. IMPRESSION: Intraoperative fluoroscopy during olecranon fracture fixation. Electronically Signed   By: Andrea Gasman M.D.   On: 02/08/2024 15:56   DG C-Arm 1-60 Min-No Report Result Date: 02/08/2024 Fluoroscopy was utilized by the requesting physician.  No radiographic interpretation.   DG Hand 2 View Right Result Date: 02/07/2024 CLINICAL DATA:  Right hand swelling. EXAM: RIGHT HAND - 2 VIEW COMPARISON:  None  Available. FINDINGS: Age indeterminate angulated fracture of the distal second metacarpal call suboptimally evaluated on this radiograph. Correlation with clinical exam and point tenderness recommended. No other fracture. The bones are osteopenic. No dislocation. Vascular calcifications noted. There is soft tissue swelling of the hand. No radiopaque foreign object/gas. IMPRESSION: Age indeterminate angulated fracture of the distal second metacarpal. Correlation with clinical exam and point tenderness recommended. Electronically Signed   By: Vanetta Chou M.D.   On: 02/07/2024 20:33   EEG adult Result Date: 02/07/2024 Shelton Arlin KIDD, MD     02/07/2024  3:06 PM Patient Name: TOMMY GOOSTREE MRN: [id number] Epilepsy Attending: Arlin KIDD Shelton Referring Physician/Provider: Georgina Basket, MD Date: 02/07/2024 Duration: 29.14 mins Patient history: 73yo M with syncope. EEG to evaluate for seizure Level of alertness: Awake AEDs during EEG study: GBP Technical aspects: This EEG study was done with scalp electrodes positioned according to the 10-20 International system of electrode placement. Electrical activity was reviewed with band pass filter of 1-70Hz , sensitivity of 7 uV/mm, display speed of 11mm/sec with Airiel Oblinger 60Hz  notched filter applied as appropriate. EEG data were recorded continuously and digitally stored.  Video monitoring was available and reviewed as appropriate. Description: The posterior dominant rhythm consists of 8 Hz activity of moderate voltage (25-35 uV) seen predominantly in posterior head regions, symmetric and reactive to eye opening and eye closing. EEG showed continuous 5 to 7 Hz theta slowing in right temporo-occipital region. Hyperventilation and photic stimulation were not performed.   ABNORMALITY - Continuous slow, right temporo-occipital region IMPRESSION: This study is suggestive of cortical dysfunction arising from right temporo-occipital region likely secondary to underlying structural  abnormality/ stroke. No seizures or epileptiform discharges were seen throughout the recording. Priyanka O Yadav        Scheduled Meds:  acetaminophen   1,000 mg Oral TID   acetaminophen   650 mg Oral Once   atorvastatin   10 mg Oral QHS   cholecalciferol  1,000 Units Oral Daily   escitalopram   10 mg Oral Daily   feeding supplement  237 mL Oral BID BM   gabapentin  300 mg Oral TID   insulin  aspart  0-15 Units Subcutaneous TID WC   memantine   5 mg Oral BID   methocarbamol  500 mg Oral TID   polyethylene glycol  17 g Oral BID   psyllium  1 packet Oral QHS   Continuous Infusions:  sodium chloride  100 mL/hr at 02/09/24 0506    ceFAZolin (ANCEF) IV 2 g (02/09/24 0506)     LOS: 2 days    Time spent: over 30 min     Meliton Monte, MD [facility]   To contact the attending provider between 7A-7P or the covering provider during after hours 7P-7A, please log into the web site [email or web address] and access using universal [place] password for that web site. If you do not have the password, please call the hospital operator.  02/09/2024, 2:07 PM

## 2024-02-09 NOTE — Inpatient Diabetes Management (Addendum)
 Inpatient Diabetes Program Recommendations  AACE/ADA: New Consensus Statement on Inpatient Glycemic Control  Target Ranges:  Prepandial:   less than 140 mg/dL      Peak postprandial:   less than 180 mg/dL (1-2 hours)      Critically ill patients:  140 - 180 mg/dL   Lab Results  Component Value Date   GLUCAP 277 (H) 02/09/2024   HGBA1C 6.3 (A) 02/04/2024    Latest Reference Range & Units 02/08/24 06:36 02/08/24 10:57 02/08/24 15:09 02/08/24 17:08 02/08/24 21:18 02/09/24 05:58 02/09/24 08:12  Glucose-Capillary 70 - 99 mg/dL 811 (H) 839 (H) 791 (H) 168 (H) 288 (H) 308 (H) 277 (H)   Review of Glycemic Control   Diabetes history: DM2   Outpatient Diabetes medications: Amaryl  2mg  daily  Metformin  850mg  BID  Actos  45mg  daily    Current orders for Inpatient glycemic control:  Novolog  0-15 units TID    Inpatient Diabetes Program Recommendations:  Noted patient received Decadron yesterday at 1256.   Please consider adding Novolog  2 units TID with meals (if patient eats atleast 50% of meals).   Thanks,  Lavanda Search, RN, MSN, [facility]  Inpatient Diabetes Coordinator  Pager [phone] (8a-5p)

## 2024-02-09 NOTE — Progress Notes (Signed)
 Echocardiogram 2D Echocardiogram has been performed.  Dennis Wise 02/09/2024, 10:19 AM

## 2024-02-09 NOTE — H&P (View-Only) (Signed)
 ORTHOPAEDIC CONSULTATION  REQUESTING PHYSICIAN: Perri DELENA Meliton Mickey., *  Chief Complaint: left femoral neck fracture  HPI: Dennis Wise is a 73 y.o. male who was recuperating status post cervical fusion done in [place].  He had a syncopal episode resulting in a fracture to the left hip and left elbow.  Patient was initially evaluated by Dr. Celena.  CT scan demonstrated a complex intertrochanteric fracture with femoral neck comminution and extension.  He underwent surgery with Dr. Celena on Monday, 02/08/2024.  I was requested to evaluate the left hip given the complexity of the fracture pattern.  Past Medical History:  Diagnosis Date   Actinic keratosis 10/16/2013   Bilateral carotid artery stenosis 09/23/2021   Less than 50% bilaterally 2023   Cervical stenosis of spinal canal    Combined form of senile cataract of both eyes 05/02/2016   Diabetic retinopathy 03/21/2005   Dupuytren contracture 03/03/2019   Essential hypertension 11/20/1999   Last Assessment & Plan: Change to 5mg  ramipril  and see if the lightheaded troubles get better.  He'll update me as needed.   [name]    Generalized anxiety disorder 10/01/2022   GERD (gastroesophageal reflux disease) 01/1989   Hearing loss 10/20/2014   Helicobacter pylori gastritis 06/11/2011   On EGD 05/2011    Hemorrhagic disorder due to circulating anticoagulants 09/27/2022   History of colonic polyps 06/05/2012   06/05/2011 - 12 polyps max 7 mm, at least 9 adenomas  04/07/2013 - no polyps - repeat colonoscopy 2019 [name], MD, [facility]        IMO SNOMED Dx Update Oct 2024     Hyperlipidemia    Internal hemorrhoids    Iron  deficiency anemia, unspecified 03/04/2011   Ischemic right PCA stroke 09/05/2022   Left homonymous hemianopsia 08/2022   Caused by right PCA stroke   MGUS (monoclonal gammopathy of unknown significance)    possible dx initially, had negative f/u   Mild vascular neurocognitive disorder 05/07/2023   Monoclonal  B-cell lymphocytosis 01/15/2022   NAFLD (nonalcoholic fatty liver disease)    Nocturia 08/18/2021   Normocytic anemia 04/03/2021   Obesity (BMI 30-39.9) 10/16/2013   Pain in joint of left knee 09/27/2022   Pancytopenia, acquired 05/01/2011   Pneumonia    Pseudophakia, both eyes 07/17/2016   Pure hypercholesterolemia 04/21/1988   Last Assessment & Plan: Continue statin, continue work on diet, d/w pt about labs.  He agrees.   Shoulder pain 07/02/2022   Syncope 11/22/2021   Thrombocytopenia 05/03/2021   Tibial fracture 10/24/2010   L tibia.  Repair 2012.    L tibia.  Repair 2012.  Last Assessment & Plan:  Per ortho.     Type II diabetes mellitus 04/21/1988   With h/o dec in sensation on feet     Unstable ankle 09/12/2020   Past Surgical History:  Procedure Laterality Date   CATARACT EXTRACTION Bilateral    CERVICAL FUSION  01/05/2024   C2-T2   COLONOSCOPY     ESOPHAGOGASTRODUODENOSCOPY     2012 H. pylori gastritis   FLEXIBLE SIGMOIDOSCOPY  05/1988   internal hemorrhoids   KNEE SURGERY  12/2002   lt knee fx repair ORIF   TIBIA FRACTURE SURGERY     left 2012   Social History   Socioeconomic History   Marital status: Married    Spouse name: [name]   Number of children: 1   Years of education: 12   Highest education level: 12th grade  Occupational History  Occupation: Audio engineer-retired  Tobacco Use   Smoking status: Never   Smokeless tobacco: Never  Vaping Use   Vaping status: Never Used  Substance and Sexual Activity   Alcohol use: Never   Drug use: No   Sexual activity: Yes    Birth control/protection: None  Other Topics Concern   Not on file  Social History Narrative   Prev traveling for sports broadcasting    Working for [employer], [employer] etc as of 2019   Married 1970   1 child   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   Right handed   One floor home   Drinks caffeine   Lives with wife   Social Drivers of Health   Financial Resource Strain: Low Risk   (02/03/2024)   Overall Financial Resource Strain (CARDIA)    Difficulty of Paying Living Expenses: Not hard at all  Food Insecurity: No Food Insecurity (02/07/2024)   Hunger Vital Sign    Worried About Running Out of Food in the Last Year: Never true    Ran Out of Food in the Last Year: Never true  Transportation Needs: No Transportation Needs (02/07/2024)   PRAPARE - Administrator, Civil Service (Medical): No    Lack of Transportation (Non-Medical): No  Physical Activity: Inactive (02/03/2024)   Exercise Vital Sign    Days of Exercise per Week: 0 days    Minutes of Exercise per Session: Not on file  Stress: No Stress Concern Present (02/03/2024)   [employer] of Occupational Health - Occupational Stress Questionnaire    Feeling of Stress: Not at all  Social Connections: Socially Integrated (02/07/2024)   Social Connection and Isolation Panel    Frequency of Communication with Friends and Family: Three times a week    Frequency of Social Gatherings with Friends and Family: Once a week    Attends Religious Services: More than 4 times per year    Active Member of [employer] or Organizations: Yes    Attends Engineer, structural: More than 4 times per year    Marital Status: Married   Family History  Problem Relation Age of Onset   Cancer Mother        ?   Diabetes Mother    Heart failure Mother    Emphysema Father    Cancer Father        ?   Alzheimer's disease Father    Dementia Father    Skin cancer Brother    Diabetes Brother    Colon cancer Neg Hx    Prostate cancer Neg Hx    Esophageal cancer Neg Hx    Rectal cancer Neg Hx    Stomach cancer Neg Hx    Allergies  Allergen Reactions   Hydralazine      Short of breath.     Promethazine Hcl     REACTION: AGITIATION     Positive ROS: All other systems have been reviewed and were otherwise negative with the exception of those mentioned in the HPI and as above.  Physical Exam: General: Alert,  no acute distress Cardiovascular: No pedal edema Respiratory: No cyanosis, no use of accessory musculature Skin: No lesions in the area of chief complaint Neurologic: Sensation intact distally Psychiatric: Patient is competent for consent with normal mood and affect  MUSCULOSKELETAL: LLE No traumatic wounds, ecchymosis, or rash  Tender about the the lefthip \  groin pain with log roll  No knee or ankle effusion  Sens DPN, SPN, TN  intact  Motor EHL, ext, flex 5/5  DP 2+, PT 2+, No significant edema   RLE No traumatic wounds, ecchymosis, or rash  Nontender  No groin pain with log roll  No knee or ankle effusion  Sens DPN, SPN, TN intact  Motor EHL, ext, flex 5/5  DP 2+, PT 2+, No significant edema   IMAGING: X-rays and CT scan left hip reviewed demonstrate a comminuted intertrochanteric fracture with extension into the left femoral neck including the subcapital aspect of the femoral neck.  Assessment: Principal Problem:   Closed left hip fracture (HCC)   Left femoral neck/intertrochanteric femur fracture  Plan: Patient has an unfortunate fracture pattern involving the the left femoral neck and intertrochanteric femur.  Given the comminution of the femoral neck including subcapital neck, there would be high risk of fixation failure with intramedullary nail fixation.  Ultimately patient will likely have a better outcome and result with hip arthroplasty.  The intertrochanteric extension will make arthroplasty fixation also challenging and we will be prepared to use a diaphyseal engaging stem or cemented stem to achieve good fixation of the fracture.  Will also plan for likely cable fixation around the intertrochanteric region.  Patient expresses understanding and agreement with the plan for surgery.  All questions were answered. Patient is tentatively scheduled for surgery 02/10/2024.  The risks benefits and alternatives were discussed with the patient including but not limited to  the risks of nonoperative treatment, versus surgical intervention including infection, bleeding, nerve injury, periprosthetic fracture, the need for revision surgery, dislocation, leg length discrepancy, blood clots, cardiopulmonary complications, morbidity, mortality, among others, and they were willing to proceed.        TORIBIO DELENA HIGASHI, MD  Contact information:   Tzzxijbd 7am-5pm epic message Dr. HIGASHI, or call office for patient follow up: [phone] After hours and holidays please check [email or web address] for group call information for [facility]

## 2024-02-09 NOTE — Progress Notes (Signed)
 OT Cancellation Note  Patient Details Name: Dennis Wise MRN: [id number] DOB: [birth year]   Cancelled Treatment:    Reason Eval/Treat Not Completed: Patient not medically ready. Awaiting op note without entry; and pt continues to be in [facility] LLE. Plans for OR at [facility] tomorrow?   Avi Kerschner D Walton, OTD, OTR/L [facility] Acute Rehabilitation Office: [phone]   Elma JONETTA Penner 02/09/2024, 3:00 PM

## 2024-02-09 NOTE — Progress Notes (Signed)
 PT Cancellation Note  Patient Details Name: Dennis Wise MRN: [id number] DOB: 1951-01-23   Cancelled Treatment:    Reason Eval/Treat Not Completed: Patient not medically ready (LLE [facility]). Will continue to keep an eye on.   Richerd Lipoma, PT  Acute Rehab Services Secure chat preferred Office [phone]    Richerd LITTIE Lipoma 02/09/2024, 3:05 PM

## 2024-02-09 NOTE — Consult Note (Signed)
 ORTHOPAEDIC CONSULTATION  REQUESTING PHYSICIAN: Perri DELENA Meliton Mickey., *  Chief Complaint: left femoral neck fracture  HPI: Dennis Wise is a 73 y.o. male who was recuperating status post cervical fusion done in [place].  He had a syncopal episode resulting in a fracture to the left hip and left elbow.  Patient was initially evaluated by Dr. Celena.  CT scan demonstrated a complex intertrochanteric fracture with femoral neck comminution and extension.  He underwent surgery with Dr. Celena on Monday, 02/08/2024.  I was requested to evaluate the left hip given the complexity of the fracture pattern.  Past Medical History:  Diagnosis Date   Actinic keratosis 10/16/2013   Bilateral carotid artery stenosis 09/23/2021   Less than 50% bilaterally 2023   Cervical stenosis of spinal canal    Combined form of senile cataract of both eyes 05/02/2016   Diabetic retinopathy 03/21/2005   Dupuytren contracture 03/03/2019   Essential hypertension 11/20/1999   Last Assessment & Plan: Change to 5mg  ramipril  and see if the lightheaded troubles get better.  He'll update me as needed.   [name]    Generalized anxiety disorder 10/01/2022   GERD (gastroesophageal reflux disease) 01/1989   Hearing loss 10/20/2014   Helicobacter pylori gastritis 06/11/2011   On EGD 05/2011    Hemorrhagic disorder due to circulating anticoagulants 09/27/2022   History of colonic polyps 06/05/2012   06/05/2011 - 12 polyps max 7 mm, at least 9 adenomas  04/07/2013 - no polyps - repeat colonoscopy 2019 [name], MD, [facility]        IMO SNOMED Dx Update Oct 2024     Hyperlipidemia    Internal hemorrhoids    Iron  deficiency anemia, unspecified 03/04/2011   Ischemic right PCA stroke 09/05/2022   Left homonymous hemianopsia 08/2022   Caused by right PCA stroke   MGUS (monoclonal gammopathy of unknown significance)    possible dx initially, had negative f/u   Mild vascular neurocognitive disorder 05/07/2023   Monoclonal  B-cell lymphocytosis 01/15/2022   NAFLD (nonalcoholic fatty liver disease)    Nocturia 08/18/2021   Normocytic anemia 04/03/2021   Obesity (BMI 30-39.9) 10/16/2013   Pain in joint of left knee 09/27/2022   Pancytopenia, acquired 05/01/2011   Pneumonia    Pseudophakia, both eyes 07/17/2016   Pure hypercholesterolemia 04/21/1988   Last Assessment & Plan: Continue statin, continue work on diet, d/w pt about labs.  He agrees.   Shoulder pain 07/02/2022   Syncope 11/22/2021   Thrombocytopenia 05/03/2021   Tibial fracture 10/24/2010   L tibia.  Repair 2012.    L tibia.  Repair 2012.  Last Assessment & Plan:  Per ortho.     Type II diabetes mellitus 04/21/1988   With h/o dec in sensation on feet     Unstable ankle 09/12/2020   Past Surgical History:  Procedure Laterality Date   CATARACT EXTRACTION Bilateral    CERVICAL FUSION  01/05/2024   C2-T2   COLONOSCOPY     ESOPHAGOGASTRODUODENOSCOPY     2012 H. pylori gastritis   FLEXIBLE SIGMOIDOSCOPY  05/1988   internal hemorrhoids   KNEE SURGERY  12/2002   lt knee fx repair ORIF   TIBIA FRACTURE SURGERY     left 2012   Social History   Socioeconomic History   Marital status: Married    Spouse name: [name]   Number of children: 1   Years of education: 12   Highest education level: 12th grade  Occupational History  Occupation: Audio engineer-retired  Tobacco Use   Smoking status: Never   Smokeless tobacco: Never  Vaping Use   Vaping status: Never Used  Substance and Sexual Activity   Alcohol use: Never   Drug use: No   Sexual activity: Yes    Birth control/protection: None  Other Topics Concern   Not on file  Social History Narrative   Prev traveling for sports broadcasting    Working for [employer], [employer] etc as of 2019   Married 1970   1 child   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   Right handed   One floor home   Drinks caffeine   Lives with wife   Social Drivers of Health   Financial Resource Strain: Low Risk   (02/03/2024)   Overall Financial Resource Strain (CARDIA)    Difficulty of Paying Living Expenses: Not hard at all  Food Insecurity: No Food Insecurity (02/07/2024)   Hunger Vital Sign    Worried About Running Out of Food in the Last Year: Never true    Ran Out of Food in the Last Year: Never true  Transportation Needs: No Transportation Needs (02/07/2024)   PRAPARE - Administrator, Civil Service (Medical): No    Lack of Transportation (Non-Medical): No  Physical Activity: Inactive (02/03/2024)   Exercise Vital Sign    Days of Exercise per Week: 0 days    Minutes of Exercise per Session: Not on file  Stress: No Stress Concern Present (02/03/2024)   [employer] of Occupational Health - Occupational Stress Questionnaire    Feeling of Stress: Not at all  Social Connections: Socially Integrated (02/07/2024)   Social Connection and Isolation Panel    Frequency of Communication with Friends and Family: Three times a week    Frequency of Social Gatherings with Friends and Family: Once a week    Attends Religious Services: More than 4 times per year    Active Member of [employer] or Organizations: Yes    Attends Engineer, structural: More than 4 times per year    Marital Status: Married   Family History  Problem Relation Age of Onset   Cancer Mother        ?   Diabetes Mother    Heart failure Mother    Emphysema Father    Cancer Father        ?   Alzheimer's disease Father    Dementia Father    Skin cancer Brother    Diabetes Brother    Colon cancer Neg Hx    Prostate cancer Neg Hx    Esophageal cancer Neg Hx    Rectal cancer Neg Hx    Stomach cancer Neg Hx    Allergies  Allergen Reactions   Hydralazine      Short of breath.     Promethazine Hcl     REACTION: AGITIATION     Positive ROS: All other systems have been reviewed and were otherwise negative with the exception of those mentioned in the HPI and as above.  Physical Exam: General: Alert,  no acute distress Cardiovascular: No pedal edema Respiratory: No cyanosis, no use of accessory musculature Skin: No lesions in the area of chief complaint Neurologic: Sensation intact distally Psychiatric: Patient is competent for consent with normal mood and affect  MUSCULOSKELETAL: LLE No traumatic wounds, ecchymosis, or rash  Tender about the the lefthip \  groin pain with log roll  No knee or ankle effusion  Sens DPN, SPN, TN  intact  Motor EHL, ext, flex 5/5  DP 2+, PT 2+, No significant edema   RLE No traumatic wounds, ecchymosis, or rash  Nontender  No groin pain with log roll  No knee or ankle effusion  Sens DPN, SPN, TN intact  Motor EHL, ext, flex 5/5  DP 2+, PT 2+, No significant edema   IMAGING: X-rays and CT scan left hip reviewed demonstrate a comminuted intertrochanteric fracture with extension into the left femoral neck including the subcapital aspect of the femoral neck.  Assessment: Principal Problem:   Closed left hip fracture (HCC)   Left femoral neck/intertrochanteric femur fracture  Plan: Patient has an unfortunate fracture pattern involving the the left femoral neck and intertrochanteric femur.  Given the comminution of the femoral neck including subcapital neck, there would be high risk of fixation failure with intramedullary nail fixation.  Ultimately patient will likely have a better outcome and result with hip arthroplasty.  The intertrochanteric extension will make arthroplasty fixation also challenging and we will be prepared to use a diaphyseal engaging stem or cemented stem to achieve good fixation of the fracture.  Will also plan for likely cable fixation around the intertrochanteric region.  Patient expresses understanding and agreement with the plan for surgery.  All questions were answered. Patient is tentatively scheduled for surgery 02/10/2024.  The risks benefits and alternatives were discussed with the patient including but not limited to  the risks of nonoperative treatment, versus surgical intervention including infection, bleeding, nerve injury, periprosthetic fracture, the need for revision surgery, dislocation, leg length discrepancy, blood clots, cardiopulmonary complications, morbidity, mortality, among others, and they were willing to proceed.        TORIBIO DELENA HIGASHI, MD  Contact information:   Tzzxijbd 7am-5pm epic message Dr. HIGASHI, or call office for patient follow up: [phone] After hours and holidays please check [email or web address] for group call information for [facility]

## 2024-02-10 ENCOUNTER — Inpatient Hospital Stay (HOSPITAL_COMMUNITY): Admitting: Anesthesiology

## 2024-02-10 ENCOUNTER — Inpatient Hospital Stay (HOSPITAL_COMMUNITY)

## 2024-02-10 ENCOUNTER — Inpatient Hospital Stay: Admit: 2024-02-10 | Admitting: Orthopedic Surgery

## 2024-02-10 ENCOUNTER — Encounter (HOSPITAL_COMMUNITY)
Admission: EM | Disposition: A | Discharge status: Skilled Nursing Facility | Payer: Self-pay | Source: Home / Self Care | Attending: Internal Medicine

## 2024-02-10 ENCOUNTER — Encounter (HOSPITAL_COMMUNITY): Payer: Self-pay | Admitting: Orthopedic Surgery

## 2024-02-10 DIAGNOSIS — I10 Essential (primary) hypertension: Secondary | ICD-10-CM | POA: Diagnosis not present

## 2024-02-10 DIAGNOSIS — S72002A Fracture of unspecified part of neck of left femur, initial encounter for closed fracture: Secondary | ICD-10-CM | POA: Diagnosis not present

## 2024-02-10 DIAGNOSIS — E119 Type 2 diabetes mellitus without complications: Secondary | ICD-10-CM | POA: Diagnosis not present

## 2024-02-10 DIAGNOSIS — S72142A Displaced intertrochanteric fracture of left femur, initial encounter for closed fracture: Secondary | ICD-10-CM | POA: Diagnosis not present

## 2024-02-10 DIAGNOSIS — Z96642 Presence of left artificial hip joint: Secondary | ICD-10-CM | POA: Diagnosis not present

## 2024-02-10 HISTORY — PX: TOTAL HIP ARTHROPLASTY: SHX124

## 2024-02-10 LAB — CBC WITH DIFFERENTIAL/PLATELET
Abs Immature Granulocytes: 0.02 K/uL (ref 0.00–0.07)
Basophils Absolute: 0 K/uL (ref 0.0–0.1)
Basophils Relative: 0 %
Eosinophils Absolute: 0.3 K/uL (ref 0.0–0.5)
Eosinophils Relative: 7 %
HCT: 28.3 % — ABNORMAL LOW (ref 39.0–52.0)
Hemoglobin: 9.1 g/dL — ABNORMAL LOW (ref 13.0–17.0)
Immature Granulocytes: 0 %
Lymphocytes Relative: 17 %
Lymphs Abs: 0.9 K/uL (ref 0.7–4.0)
MCH: 28.7 pg (ref 26.0–34.0)
MCHC: 32.2 g/dL (ref 30.0–36.0)
MCV: 89.3 fL (ref 80.0–100.0)
Monocytes Absolute: 0.6 K/uL (ref 0.1–1.0)
Monocytes Relative: 11 %
Neutro Abs: 3.4 K/uL (ref 1.7–7.7)
Neutrophils Relative %: 65 %
Platelets: 99 K/uL — ABNORMAL LOW (ref 150–400)
RBC: 3.17 MIL/uL — ABNORMAL LOW (ref 4.22–5.81)
RDW: 15 % (ref 11.5–15.5)
Smear Review: NORMAL
WBC: 5.2 K/uL (ref 4.0–10.5)
nRBC: 0 % (ref 0.0–0.2)

## 2024-02-10 LAB — COMPREHENSIVE METABOLIC PANEL WITH GFR
ALT: 6 U/L (ref 0–44)
AST: 16 U/L (ref 15–41)
Albumin: 3.3 g/dL — ABNORMAL LOW (ref 3.5–5.0)
Alkaline Phosphatase: 102 U/L (ref 38–126)
Anion gap: 11 (ref 5–15)
BUN: 51 mg/dL — ABNORMAL HIGH (ref 8–23)
CO2: 24 mmol/L (ref 22–32)
Calcium: 8.7 mg/dL — ABNORMAL LOW (ref 8.9–10.3)
Chloride: 102 mmol/L (ref 98–111)
Creatinine, Ser: 1.59 mg/dL — ABNORMAL HIGH (ref 0.61–1.24)
GFR, Estimated: 45 mL/min — ABNORMAL LOW (ref >60)
Glucose, Bld: 208 mg/dL — ABNORMAL HIGH (ref 70–99)
Potassium: 4.6 mmol/L (ref 3.5–5.1)
Sodium: 137 mmol/L (ref 135–145)
Total Bilirubin: 0.6 mg/dL (ref 0.0–1.2)
Total Protein: 5.5 g/dL — ABNORMAL LOW (ref 6.5–8.1)

## 2024-02-10 LAB — PROTEIN ELECTROPHORESIS, SERUM
A/G Ratio: 1.2 (ref 0.7–1.7)
Albumin ELP: 2.8 g/dL — ABNORMAL LOW (ref 2.9–4.4)
Alpha-1-Globulin: 0.3 g/dL (ref 0.0–0.4)
Alpha-2-Globulin: 0.7 g/dL (ref 0.4–1.0)
Beta Globulin: 0.7 g/dL (ref 0.7–1.3)
Gamma Globulin: 0.7 g/dL (ref 0.4–1.8)
Globulin, Total: 2.4 g/dL (ref 2.2–3.9)
Total Protein ELP: 5.2 g/dL — ABNORMAL LOW (ref 6.0–8.5)

## 2024-02-10 LAB — GLUCOSE, CAPILLARY
Glucose-Capillary: 211 mg/dL — ABNORMAL HIGH (ref 70–99)
Glucose-Capillary: 194 mg/dL — ABNORMAL HIGH (ref 70–99)
Glucose-Capillary: 172 mg/dL — ABNORMAL HIGH (ref 70–99)
Glucose-Capillary: 193 mg/dL — ABNORMAL HIGH (ref 70–99)
Glucose-Capillary: 260 mg/dL — ABNORMAL HIGH (ref 70–99)

## 2024-02-10 LAB — PHOSPHORUS: Phosphorus: 2.8 mg/dL (ref 2.5–4.6)

## 2024-02-10 LAB — MAGNESIUM: Magnesium: 1.9 mg/dL (ref 1.7–2.4)

## 2024-02-10 LAB — PREPARE RBC (CROSSMATCH)

## 2024-02-10 SURGERY — ARTHROPLASTY, HIP, TOTAL,POSTERIOR APPROACH
Anesthesia: General | Site: Hip | Laterality: Left

## 2024-02-10 MED ORDER — LIDOCAINE HCL (PF) 2 % IJ SOLN
INTRAMUSCULAR | Status: AC
  Filled 2024-02-10: qty 5

## 2024-02-10 MED ORDER — ROCURONIUM BROMIDE 100 MG/10ML IV SOLN
INTRAVENOUS | Status: DC | PRN
  Administered 2024-02-10: 70 mg via INTRAVENOUS
  Administered 2024-02-10: 20 mg via INTRAVENOUS

## 2024-02-10 MED ORDER — SODIUM CHLORIDE 0.9 % IR SOLN
Status: DC | PRN
  Administered 2024-02-10: 3000 mL

## 2024-02-10 MED ORDER — HYDROMORPHONE HCL 1 MG/ML IJ SOLN
0.5000 mg | INTRAMUSCULAR | Status: DC | PRN
  Administered 2024-02-10 – 2024-02-16 (×13): 1 mg via INTRAVENOUS
  Filled 2024-02-10 (×14): qty 1

## 2024-02-10 MED ORDER — FENTANYL CITRATE (PF) 100 MCG/2ML IJ SOLN
INTRAMUSCULAR | Status: AC
  Filled 2024-02-10: qty 2

## 2024-02-10 MED ORDER — FENTANYL CITRATE (PF) 50 MCG/ML IJ SOSY
25.0000 ug | PREFILLED_SYRINGE | INTRAMUSCULAR | Status: DC | PRN
  Administered 2024-02-10: 50 ug via INTRAVENOUS

## 2024-02-10 MED ORDER — CEFAZOLIN SODIUM-DEXTROSE 2-4 GM/100ML-% IV SOLN
2.0000 g | INTRAVENOUS | Status: AC
  Administered 2024-02-10: 2 g via INTRAVENOUS
  Filled 2024-02-10: qty 100

## 2024-02-10 MED ORDER — SODIUM CHLORIDE 0.9% IV SOLUTION: Freq: Once | INTRAVENOUS | Status: AC

## 2024-02-10 MED ORDER — ONDANSETRON 4 MG PO TBDP
4.0000 mg | ORAL_TABLET | Freq: Three times a day (TID) | ORAL | Status: DC | PRN
Start: 2024-02-10 — End: 2024-02-17

## 2024-02-10 MED ORDER — SODIUM CHLORIDE (PF) 0.9 % IJ SOLN
INTRAMUSCULAR | Status: AC
  Filled 2024-02-10: qty 30

## 2024-02-10 MED ORDER — INSULIN ASPART 100 UNIT/ML IJ SOLN: 0.0000 [IU] | INTRAMUSCULAR | Status: DC | PRN

## 2024-02-10 MED ORDER — LACTATED RINGERS IV SOLN
INTRAVENOUS | Status: DC
Start: 2024-02-10 — End: 2024-02-10

## 2024-02-10 MED ORDER — SUCCINYLCHOLINE CHLORIDE 200 MG/10ML IV SOSY
PREFILLED_SYRINGE | INTRAVENOUS | Status: DC | PRN
Start: 2024-02-10 — End: 2024-02-10
  Administered 2024-02-10: 140 mg via INTRAVENOUS

## 2024-02-10 MED ORDER — TRANEXAMIC ACID-NACL 1000-0.7 MG/100ML-% IV SOLN
1000.0000 mg | INTRAVENOUS | Status: AC
  Administered 2024-02-10: 1000 mg via INTRAVENOUS
  Filled 2024-02-10 (×2): qty 100

## 2024-02-10 MED ORDER — ASPIRIN 81 MG PO TBEC
81.0000 mg | DELAYED_RELEASE_TABLET | Freq: Two times a day (BID) | ORAL | Status: DC
Start: 2024-02-11 — End: 2024-02-17
  Administered 2024-02-11 – 2024-02-17 (×13): 81 mg via ORAL
  Filled 2024-02-10 (×13): qty 1

## 2024-02-10 MED ORDER — ONDANSETRON HCL 4 MG/2ML IJ SOLN
INTRAMUSCULAR | Status: DC | PRN
  Administered 2024-02-10: 4 mg via INTRAVENOUS

## 2024-02-10 MED ORDER — PHENYLEPHRINE HCL-NACL 20-0.9 MG/250ML-% IV SOLN
INTRAVENOUS | Status: AC
  Filled 2024-02-10: qty 250

## 2024-02-10 MED ORDER — WATER FOR IRRIGATION, STERILE IR SOLN
Status: DC | PRN
  Administered 2024-02-10: 2000 mL

## 2024-02-10 MED ORDER — CHLORHEXIDINE GLUCONATE 4 % EX SOLN
60.0000 mL | Freq: Once | CUTANEOUS | Status: DC
  Filled 2024-02-10: qty 60

## 2024-02-10 MED ORDER — BUPIVACAINE-EPINEPHRINE (PF) 0.25% -1:200000 IJ SOLN
INTRAMUSCULAR | Status: AC
  Filled 2024-02-10: qty 30

## 2024-02-10 MED ORDER — ONDANSETRON HCL 4 MG/2ML IJ SOLN
INTRAMUSCULAR | Status: AC
  Filled 2024-02-10: qty 2

## 2024-02-10 MED ORDER — ONDANSETRON HCL 4 MG/2ML IJ SOLN
4.0000 mg | Freq: Four times a day (QID) | INTRAMUSCULAR | Status: DC
  Administered 2024-02-11 – 2024-02-16 (×16): 4 mg via INTRAVENOUS
  Filled 2024-02-10 (×20): qty 2

## 2024-02-10 MED ORDER — ACETAMINOPHEN 10 MG/ML IV SOLN
1000.0000 mg | Freq: Once | INTRAVENOUS | Status: DC | PRN
  Administered 2024-02-10: 1000 mg via INTRAVENOUS

## 2024-02-10 MED ORDER — ORAL CARE MOUTH RINSE: 15.0000 mL | Freq: Once | OROMUCOSAL | Status: AC

## 2024-02-10 MED ORDER — DROPERIDOL 2.5 MG/ML IJ SOLN: 0.6250 mg | Freq: Once | INTRAMUSCULAR | Status: DC | PRN

## 2024-02-10 MED ORDER — MIDAZOLAM HCL 2 MG/2ML IJ SOLN
INTRAMUSCULAR | Status: AC
  Filled 2024-02-10: qty 2

## 2024-02-10 MED ORDER — CLOPIDOGREL BISULFATE 75 MG PO TABS
75.0000 mg | ORAL_TABLET | Freq: Every day | ORAL | Status: DC
  Administered 2024-02-12 – 2024-02-17 (×6): 75 mg via ORAL
  Filled 2024-02-10 (×6): qty 1

## 2024-02-10 MED ORDER — FENTANYL CITRATE (PF) 100 MCG/2ML IJ SOLN
INTRAMUSCULAR | Status: DC | PRN
  Administered 2024-02-10 (×2): 100 ug via INTRAVENOUS
  Administered 2024-02-10 (×2): 25 ug via INTRAVENOUS

## 2024-02-10 MED ORDER — INSULIN GLARGINE-YFGN 100 UNIT/ML ~~LOC~~ SOLN
10.0000 [IU] | Freq: Every day | SUBCUTANEOUS | Status: DC
  Administered 2024-02-10: 10 [IU] via SUBCUTANEOUS
  Filled 2024-02-10: qty 0.1

## 2024-02-10 MED ORDER — CHLORHEXIDINE GLUCONATE 0.12 % MT SOLN
15.0000 mL | Freq: Once | OROMUCOSAL | Status: AC
Start: 2024-02-10 — End: 2024-02-10
  Administered 2024-02-10: 15 mL via OROMUCOSAL

## 2024-02-10 MED ORDER — CEFAZOLIN SODIUM-DEXTROSE 2-4 GM/100ML-% IV SOLN
2.0000 g | Freq: Three times a day (TID) | INTRAVENOUS | Status: AC
  Administered 2024-02-10 – 2024-02-11 (×2): 2 g via INTRAVENOUS
  Filled 2024-02-10 (×2): qty 100

## 2024-02-10 MED ORDER — BUPIVACAINE LIPOSOME 1.3 % IJ SUSP
INTRAMUSCULAR | Status: AC
  Filled 2024-02-10: qty 20

## 2024-02-10 MED ORDER — MIDAZOLAM HCL 5 MG/5ML IJ SOLN
INTRAMUSCULAR | Status: DC | PRN
  Administered 2024-02-10 (×2): 1 mg via INTRAVENOUS

## 2024-02-10 MED ORDER — SUGAMMADEX SODIUM 200 MG/2ML IV SOLN
INTRAVENOUS | Status: DC | PRN
  Administered 2024-02-10: 250 mg via INTRAVENOUS

## 2024-02-10 MED ORDER — PROPOFOL 10 MG/ML IV BOLUS
INTRAVENOUS | Status: DC | PRN
  Administered 2024-02-10: 150 mg via INTRAVENOUS

## 2024-02-10 MED ORDER — LIDOCAINE HCL (CARDIAC) PF 100 MG/5ML IV SOSY
PREFILLED_SYRINGE | INTRAVENOUS | Status: DC | PRN
  Administered 2024-02-10: 100 mg via INTRAVENOUS

## 2024-02-10 MED ORDER — PROPOFOL 1000 MG/100ML IV EMUL
INTRAVENOUS | Status: AC
  Filled 2024-02-10: qty 100

## 2024-02-10 MED ORDER — ISOPROPYL ALCOHOL 70 % SOLN
Status: DC | PRN
  Administered 2024-02-10: 1 via TOPICAL

## 2024-02-10 MED ORDER — ACETAMINOPHEN 10 MG/ML IV SOLN
INTRAVENOUS | Status: AC
  Filled 2024-02-10: qty 100

## 2024-02-10 MED ORDER — SODIUM CHLORIDE (PF) 0.9 % IJ SOLN
INTRAMUSCULAR | Status: DC | PRN
  Administered 2024-02-10: 80 mL

## 2024-02-10 MED ORDER — POVIDONE-IODINE 10 % EX SWAB: 2.0000 | Freq: Once | CUTANEOUS | Status: DC

## 2024-02-10 MED ORDER — FENTANYL CITRATE (PF) 50 MCG/ML IJ SOSY
PREFILLED_SYRINGE | INTRAMUSCULAR | Status: AC
  Filled 2024-02-10: qty 1

## 2024-02-10 MED ORDER — 0.9 % SODIUM CHLORIDE (POUR BTL) OPTIME
TOPICAL | Status: DC | PRN
  Administered 2024-02-10: 1000 mL

## 2024-02-10 SURGICAL SUPPLY — 68 items
BAG COUNTER SPONGE SURGICOUNT (BAG) IMPLANT
BAG ZIPLOCK 12X15 (MISCELLANEOUS) ×1 IMPLANT
BIT DRILL TRIDENT 4X40 SU (BIT) IMPLANT
BLADE SAW SAG 25X90X1.19 (BLADE) ×1 IMPLANT
BRUSH FEMORAL CANAL (MISCELLANEOUS) IMPLANT
CHLORAPREP W/TINT 26 (MISCELLANEOUS) ×2 IMPLANT
CNTNR URN SCR LID CUP LEK RST (MISCELLANEOUS) ×1 IMPLANT
COVER SURGICAL LIGHT HANDLE (MISCELLANEOUS) ×1 IMPLANT
DERMABOND ADVANCED .7 DNX12 (GAUZE/BANDAGES/DRESSINGS) ×2 IMPLANT
DRAPE HIP W/POCKET STRL (MISCELLANEOUS) ×1 IMPLANT
DRAPE INCISE IOBAN 85X60 (DRAPES) ×1 IMPLANT
DRAPE POUCH INSTRU U-SHP 10X18 (DRAPES) ×1 IMPLANT
DRAPE SHEET LG 3/4 BI-LAMINATE (DRAPES) ×3 IMPLANT
DRAPE U-SHAPE 47X51 STRL (DRAPES) ×2 IMPLANT
DRSG AQUACEL AG ADV 3.5X10 (GAUZE/BANDAGES/DRESSINGS) ×1 IMPLANT
DRSG AQUACEL AG ADV 3.5X14 (GAUZE/BANDAGES/DRESSINGS) IMPLANT
ELECT BLADE TIP CTD 4 INCH (ELECTRODE) ×1 IMPLANT
ELECT REM PT RETURN 15FT ADLT (MISCELLANEOUS) ×1 IMPLANT
FACESHIELD WRAPAROUND OR TEAM (MASK) ×1 IMPLANT
GAUZE SPONGE 4X4 12PLY STRL (GAUZE/BANDAGES/DRESSINGS) ×1 IMPLANT
GLOVE BIO SURGEON STRL SZ 6.5 (GLOVE) ×2 IMPLANT
GLOVE BIOGEL PI IND STRL 6.5 (GLOVE) ×1 IMPLANT
GLOVE BIOGEL PI IND STRL 8 (GLOVE) ×1 IMPLANT
GLOVE SURG ORTHO 8.0 STRL STRW (GLOVE) ×2 IMPLANT
GOWN STRL REUS W/ TWL XL LVL3 (GOWN DISPOSABLE) ×2 IMPLANT
HEAD FEMORAL HIP (Head) IMPLANT
HOLDER FOLEY CATH W/STRAP (MISCELLANEOUS) ×1 IMPLANT
HOOD PEEL AWAY T7 (MISCELLANEOUS) ×3 IMPLANT
INSERT 0 DEG POLY 36 F (Miscellaneous) IMPLANT
KIT BASIN OR (CUSTOM PROCEDURE TRAY) ×1 IMPLANT
KIT TURNOVER KIT A (KITS) ×1 IMPLANT
MANIFOLD NEPTUNE II (INSTRUMENTS) ×1 IMPLANT
MARKER SKIN DUAL TIP RULER LAB (MISCELLANEOUS) ×1 IMPLANT
NDL SAFETY ECLIPSE 18X1.5 (NEEDLE) ×2 IMPLANT
NS IRRIG 1000ML POUR BTL (IV SOLUTION) ×1 IMPLANT
PACK TOTAL JOINT (CUSTOM PROCEDURE TRAY) ×1 IMPLANT
PAD ARMBOARD POSITIONER FOAM (MISCELLANEOUS) ×1 IMPLANT
PENCIL SMOKE EVACUATOR (MISCELLANEOUS) ×1 IMPLANT
PRESSURIZER FEMORAL UNIV (MISCELLANEOUS) IMPLANT
PROTECTOR NERVE ULNAR (MISCELLANEOUS) IMPLANT
RETRIEVER SUT HEWSON (MISCELLANEOUS) ×1 IMPLANT
SCREW HEX LP 6.5X25 (Screw) IMPLANT
SCREW HEX LP 6.5X35 (Screw) IMPLANT
SEALER BIPOLAR AQUA 6.0 (INSTRUMENTS) IMPLANT
SET HNDPC FAN SPRY TIP SCT (DISPOSABLE) IMPLANT
SHELL TRIDENT II CLUST SZ 56MM (Shell) IMPLANT
SLEEVE CABLE 2MM VT (Orthopedic Implant) IMPLANT
SOLUTION IRRIG SURGIPHOR (IV SOLUTION) IMPLANT
SOLUTION PRONTOSAN WOUND 350ML (IRRIGATION / IRRIGATOR) IMPLANT
SPIKE FLUID TRANSFER (MISCELLANEOUS) ×1 IMPLANT
STEM FEM REV CMT 22X155 132D (Stem) IMPLANT
SUCTION TUBE FRAZIER 12FR DISP (SUCTIONS) ×1 IMPLANT
SUT BONE WAX W31G (SUTURE) ×1 IMPLANT
SUT ETHIBOND #5 BRAIDED 30INL (SUTURE) ×1 IMPLANT
SUT MNCRL AB 3-0 PS2 18 (SUTURE) ×1 IMPLANT
SUT STRATAFIX 14 PDO 48 VLT (SUTURE) ×1 IMPLANT
SUT VIC AB 2-0 CT2 27 (SUTURE) ×2 IMPLANT
SUTURE STRATFX 0 PDS 27 VIOLET (SUTURE) ×1 IMPLANT
SYR 30ML LL (SYRINGE) ×1 IMPLANT
SYR 50ML LL SCALE MARK (SYRINGE) ×1 IMPLANT
SYSTEM RST MD HIP SZ55MMP0 V40 (Hips) IMPLANT
TOWEL GREEN STERILE FF (TOWEL DISPOSABLE) ×1 IMPLANT
TOWEL OR 17X26 10 PK STRL BLUE (TOWEL DISPOSABLE) ×1 IMPLANT
TOWER CARTRIDGE SMART MIX (DISPOSABLE) IMPLANT
TRAY FOLEY MTR SLVR 16FR STAT (SET/KITS/TRAYS/PACK) IMPLANT
TUBE SUCTION HIGH CAP CLEAR NV (SUCTIONS) ×1 IMPLANT
UNDERPAD 30X36 HEAVY ABSORB (UNDERPADS AND DIAPERS) ×1 IMPLANT
WATER STERILE IRR 1000ML POUR (IV SOLUTION) ×2 IMPLANT

## 2024-02-10 NOTE — TOC Initial Note (Signed)
 Transition of Care [facility]) - Initial/Assessment Note    Patient Details  Name: Dennis Wise MRN: [id number] Date of Birth: 1951-02-27  Transition of [facility]) CM/SW Contact:    Sheri ONEIDA Sharps, LCSW Phone Number: 02/10/2024, 9:39 AM  Clinical Narrative:                 Pt from home w/ spouse. Pt continues medical workup. ICM following for dc needs.    Barriers to Discharge: Continued Medical Work up   Patient Goals and CMS Choice Patient states their goals for this hospitalization and ongoing recovery are:: return home   Choice offered to / list presented to : NA      Expected Discharge Plan and Services In-house Referral: NA Discharge Planning Services: NA   Living arrangements for the past 2 months: Skilled Nursing Facility                 DME Arranged: N/A DME Agency: NA       HH Arranged: NA HH Agency: NA        Prior Living Arrangements/Services Living arrangements for the past 2 months: Skilled Nursing Facility Lives with:: Spouse Patient language and need for interpreter reviewed:: Yes Do you feel safe going back to the place where you live?: Yes      Need for Family Participation in Patient Care: Yes (Comment) Care giver support system in place?: Yes (comment)   Criminal Activity/Legal Involvement Pertinent to Current Situation/Hospitalization: No - Comment as needed  Activities of Daily Living   ADL Screening (condition at time of admission) Independently performs ADLs?: Yes (appropriate for developmental age) Is the patient deaf or have difficulty hearing?: Yes Does the patient have difficulty seeing, even when wearing glasses/contacts?: No Does the patient have difficulty concentrating, remembering, or making decisions?: No  Permission Sought/Granted                  Emotional Assessment   Attitude/Demeanor/Rapport: Engaged Affect (typically observed): Accepting Orientation: : Oriented to Self, Oriented to Situation, Oriented to  Time,  Oriented to Place Alcohol / Substance Use: Not Applicable Psych Involvement: No (comment)  Admission diagnosis:  Closed left hip fracture (HCC) [S72.002A] Abrasion of left elbow, initial encounter [S50.312A] Closed fracture of left hip, initial encounter (HCC) [S72.002A] Closed fracture of left elbow, initial encounter [S42.402A] Patient Active Problem List   Diagnosis Date Noted   Closed left hip fracture (HCC) 02/07/2024   Post-operative pain 01/21/2024   Diabetic skin ulcer associated with type 2 diabetes mellitus (HCC) 10/06/2023   Other social stressor 05/31/2023   DM type 2 with diabetic background retinopathy (HCC) 05/31/2023   Mild vascular neurocognitive disorder 05/07/2023   Generalized anxiety disorder 10/01/2022   Pain in joint of left knee 09/27/2022   Hemorrhagic disorder due to circulating anticoagulants 09/27/2022   Ischemic right PCA stroke 09/05/2022   Left homonymous hemianopsia 08/2022   Shoulder pain 07/02/2022   Monoclonal B-cell lymphocytosis 01/15/2022   Weight loss 01/15/2022   Syncope 11/22/2021   Bilateral carotid artery stenosis 09/23/2021   Nocturia 08/18/2021   IDA (iron  deficiency anemia) 07/24/2021   Thrombocytopenia 05/03/2021   Normocytic anemia 04/03/2021   Unstable ankle 09/12/2020   Back pain 09/12/2020   Dupuytren contracture 03/03/2019   Pseudophakia, both eyes 07/17/2016   Combined form of senile cataract of both eyes 05/02/2016   Hearing loss 10/20/2014   Obesity (BMI 30-39.9) 10/16/2013   Advance care planning 10/16/2013   Actinic keratosis 10/16/2013  History of colonic polyps 06/05/2012   Pancytopenia, acquired 05/01/2011   Tibial fracture 10/24/2010   Diabetic retinopathy 03/21/2005   HTN (hypertension), malignant 11/20/1999   GERD (gastroesophageal reflux disease) 01/19/1989   Pure hypercholesterolemia 04/21/1988   PCP:  [name], MD Pharmacy:   [facility] - [place], [place] - [address] [place] [address] Phone: [phone] Fax: [phone]  [facility] [address] [place] - [address] [place] [address] Phone: [phone] Fax: [phone]     Social Drivers of Health (SDOH) Social History: SDOH Screenings   Food Insecurity: No Food Insecurity (02/07/2024)  Housing: Low Risk  (02/07/2024)  Transportation Needs: No Transportation Needs (02/07/2024)  Utilities: Not At Risk (02/07/2024)  Alcohol Screen: Low Risk  (05/05/2023)  Depression (PHQ2-9): Low Risk  (12/29/2023)  Financial Resource Strain: Low Risk  (02/03/2024)  Physical Activity: Inactive (02/03/2024)  Social Connections: Socially Integrated (02/07/2024)  Stress: No Stress Concern Present (02/03/2024)  Tobacco Use: Low Risk  (02/08/2024)  Health Literacy: Adequate Health Literacy (05/05/2023)   SDOH Interventions:     Readmission Risk Interventions     No data to display

## 2024-02-10 NOTE — Interval H&P Note (Signed)
 The patient has been re-examined, and the chart reviewed, and there have been no interval changes to the documented history and physical.     Patient has a complex intertrochanteric/femoral neck fracture of the left femur.  Fracture does not seem amenable to fixation with high risk of nonunion malunion and and AVN.  Plan for total hip arthroplasty plasty instead with cerclage repair of the intertrochanteric region and possible utilization of a revision type diaphyseal engaging stem.   The operative side was examined and the patient was confirmed to have sensation to DPN, SPN, TN intact, Motor EHL, ext, flex 5/5, and DP 2+, PT 2+, No significant edema.    The risks, benefits, and alternatives have been discussed at length with patient and his wife Sandon Yoho, and the patient is willing to proceed.  Left hip marked. Consent has been signed.

## 2024-02-10 NOTE — Op Note (Signed)
 02/07/2024 - 02/10/2024  3:48 PM  PATIENT:  Dennis Wise   MRN: [id number]  PRE-OPERATIVE DIAGNOSIS: Left displaced femoral neck fracture with comminution to the lesser trochanter  POST-OPERATIVE DIAGNOSIS:  same  PROCEDURE: Left total hip arthroplasty utilizing revision femoral component and cerclage cable fixation. (27130+22 modifier) given the extensive comminution and calcar involvement the procedure took longer than typical amount of time for a total hip arthroplasty and required utilization of a revision style femoral component to bypass the comminuted calcar as well as utilization of a cerclage cable to stabilize the femur just below the fracture.   PREOPERATIVE INDICATIONS:    Dennis Wise is an 73 y.o. male who has a diagnosis of Left displaced femoral neck fracture with comminution to the lesser trochanter and elected for surgical management after failing conservative treatment.  The risks benefits and alternatives were discussed with the patient including but not limited to the risks of nonoperative treatment, versus surgical intervention including infection, bleeding, nerve injury, periprosthetic fracture, the need for revision surgery, dislocation, leg length discrepancy, blood clots, cardiopulmonary complications, morbidity, mortality, among others, and they were willing to proceed.     OPERATIVE REPORT     SURGEON:  Toribio Higashi, MD    ASSISTANT: Bernarda Mclean, PA-C, (Present throughout the entire procedure,  necessary for completion of procedure in a timely manner, assisting with retraction, instrumentation, and closure)     ANESTHESIA: General  ESTIMATED BLOOD LOSS: 300cc    COMPLICATIONS:  None.    COMPONENTS:   Stryker Trident 256 mm acetabular shell, 6.5 hex screws x 2, restoration modular femoral component with 22 x 155 mm diaphyseal engaging stem 25+0 mm proximal body 36+5 mm ceramic femoral head neutral X.3 polyethylene liner Implant Name Type Inv. Item  Serial No. Manufacturer Lot No. LRB No. Used Action  SLEEVE CABLE VT - [id number] Orthopedic Implant SLEEVE CABLE VT  [employer] [id number] Left 1 Implanted  SLEEVE CABLE VT - [id number] Orthopedic Implant SLEEVE CABLE VT  [employer] [id number] Left 1 Implanted  SHELL TRIDENT II CLUST SZ - [id number] Shell SHELL TRIDENT II CLUST SZ  [employer] [id number] Left 1 Implanted  INSERT 0 DEG POLY 36 F - [id number] Miscellaneous INSERT 0 DEG POLY 36 F  [employer] [id number] Left 1 Implanted  SCREW HEX LP 6.5X25 - [id number] Screw SCREW HEX LP 6.5X25  [employer] [id number] Left 1 Implanted  SCREW HEX LP 6.5X35 - [id number] Screw SCREW HEX LP 6.5X35  [employer] [id number] Left 1 Implanted  RESTORATION MODULAR HIP SYSTEM DISTAL STEM X Hips   [employer] [id number] Left 1 Implanted  HEAD FEMORAL HIP - [id number] Head HEAD FEMORAL HIP  [employer] [id number] Left 1 Implanted  SYSTEM RST MD HIP [id number] V40 - [id number] Hips SYSTEM RST MD HIP [id number] V40  [employer] [id number] Left 1 Implanted    The aquamantis was utilized for this case to help facilitate better hemostasis as patient was felt to be at increased risk of bleeding because of complex case requiring increased OR time and/or exposure.        PROCEDURE IN DETAIL:   The patient was met in the holding area and  identified.  The appropriate hip was identified and marked at the operative site.  The patient was then transported to the OR  and  placed under anesthesia.  At that point, the patient was  placed in the lateral decubitus position with the operative side  up and  secured to the operating room table  and all bony prominences padded. A subaxillary role was also placed.    The operative lower extremity was prepped from the iliac crest to the distal leg.  Sterile draping was performed.  Preoperative antibiotics, 2 gm of ancef,1 gm of Tranexamic Acid, and 8 mg  of Decadron administered. Time out was performed prior to incision.      An extended posterolateral approach was utilized via sharp dissection  carried down to the subcutaneous tissue.  Gross bleeders were Bovie coagulated.  The iliotibial band was identified and incised along the length of the skin incision through the glute max fascia.  Charnley retractor was placed with care to protect the sciatic nerve posteriorly.  With the hip internally rotated, the piriformis tendon was identified and released from the femoral insertion and tagged with a #5 Ethibond.  A capsulotomy was then performed off the femoral insertion and also tagged with a #5 Ethibond.  Distally then defy the vastus lateralis which was elevated and a cerclage cable was passed circumferentially at the level of the lesser trochanter to stabilize below the distal extent of the fracture to prevent propagation. We then carefully dislocated the hip and removed the comminuted bone along the neck.  There was significant impaction of the neck into the calcar and lesser trochanter.  These pieces of bone were not salvageable and excised.  Thankfully, the greater trochanter was still intact.  The femoral head was then removed and measured to be 51 mm.    I then exposed the deep acetabulum, cleared out any tissue including the ligamentum teres.  After adequate visualization, I excised the labrum.  I then started reaming with a 50 mm reamer, first medializing to the floor of the cotyloid fossa, and then in the position of the cup aiming towards the greater sciatic notch, matching the version of the transverse acetabular ligament and tucked under the anterior wall. I reamed up to 56 mm reamer with good bony bed preparation and a 56 mm cup was chosen.  The real cup was then impacted into place.  Appropriate version and inclination was confirmed clinically matching their bony anatomy, and also with the use of the jig.  I placed 2 screws in the posterior  superior quadrant to augment fixation.  A neutral liner was placed and impacted. It was confirmed to be appropriately seated and the acetabular retractors were removed.  I then started reaming the femoral component.  I reamed the diaphyseal stem for a 155 length stem.  Reamed sequentially up to 20 mm fluoroscopic images confirmed appropriate position.  I then reamed up to another additional 2 mm which had a good cortical chatter and fit.  A 22 mm x 1.5 mm stem was opened and impacted distally.  It was well-seated and rotationally and axially stable.  I then started preparation of the proximal stem.  Reamed sequentially up to a 25 mm proximal stem.  A 25+0 proximal body with 30 degrees of anteversion.  We trialed various head balls and a +5 mm head ball seem to have good restoration of length and stability.   .  There was no impingement with full extension and 90 degrees external rotation.  The hip was stable at the position of sleep and with 90 degrees flexion and 60 degrees of internal rotation.  Leg lengths were also clinically assessed in the lateral position and felt to be equal. Intra-Op fluoro confirmed appropriate component positions.  Good  fill of the femur   And restoration of leg length and offset. No evidence or concern for fracture.  A final femoral 25+0 proximal body  was selected and impacted secured with the locking screw..I again trialed and selected a 36+ +19mm ball. The hip was then reduced and taken through a range of motion. There was no impingement with full extension and 90 degrees external rotation.  The hip was stable at the position of sleep and with 90 degrees flexion and 60 degrees of internal rotation. Leg lengths were  again assessed and felt to be restored.  We then opened, and I impacted the real head ball into place.  The posterior capsule was then closed with #5 Ethibond.    I then irrigated the hip copiously with dilute Betadine and with normal saline pulse lavage.  Periarticular injection was then performed with Exparel.   We repaired the fascia #1 barbed suture, followed by 0 barbed suture for the subcutaneous fat.  Skin was closed with 2-0 Vicryl and 3-0 Monocryl.  Dermabond and Aquacel dressing were applied. The patient was then awakened and returned to PACU in stable and satisfactory condition.  Leg lengths in the supine position were assessed and felt to be clinically equal. There were no complications.  Post op recs: WB: 50% PWB LLE, 6 weeks posterior hip precautions x6 weeks Abx: ancef Imaging: PACU pelvis Xray Dressing: Aquacell, keep intact until follow up DVT prophylaxis: Aspirin  81BID starting POD1, resume plavix  POD2 Follow up: 2 weeks after surgery for a wound check with Dr. Edna at [facility].  Address: [address], [place], [place] [address]  Office Phone: [phone]   Toribio Edna, MD Orthopedic Surgeon

## 2024-02-10 NOTE — Transfer of Care (Signed)
 Immediate Anesthesia Transfer of Care Note  Patient: Dennis Wise  Procedure(s) Performed: ARTHROPLASTY, HIP, TOTAL,POSTERIOR APPROACH (Left: Hip)  Patient Location: PACU  Anesthesia Type:General  Level of Consciousness: drowsy and patient cooperative  Airway & Oxygen Therapy: Patient Spontanous Breathing and Patient connected to nasal cannula oxygen  Post-op Assessment: Report given to RN and Post -op Vital signs reviewed and stable  Post vital signs: Reviewed and stable  Last Vitals:  Vitals Value Taken Time  BP 148/55 02/10/24 16:45  Temp    Pulse 50 02/10/24 16:46  Resp 15 02/10/24 16:46  SpO2 99 % 02/10/24 16:46  Vitals shown include unfiled device data.  Last Pain:  Vitals:   02/10/24 1122  TempSrc:   PainSc: 7       Patients Stated Pain Goal: 4 (02/10/24 1122)  Complications: No notable events documented.

## 2024-02-10 NOTE — Progress Notes (Signed)
 OT Cancellation Note  Patient Details Name: Dennis Wise MRN: [id number] DOB: 05-06-1950   Cancelled Treatment:    Reason Eval/Treat Not Completed: Other (comment)   Patient for tentative OR as per ortho MD this day. Will continue to follow for OT evaluation once patient medically ready.   Arlynn Stare OT/L Acute Rehabilitation Department  [phone]   02/10/2024, 7:44 AM

## 2024-02-10 NOTE — Anesthesia Preprocedure Evaluation (Addendum)
 Anesthesia Evaluation  Patient identified by MRN, date of birth, ID band Patient awake    Reviewed: Allergy & Precautions, NPO status , Patient's Chart, lab work & pertinent test results  Airway Mallampati: IV  TM Distance: <3 FB Neck ROM: Limited   Comment: C-Collar in situ from C2/T2 fusion ~5 weeks ago  Dental no notable dental hx.    Pulmonary neg pulmonary ROS   Pulmonary exam normal        Cardiovascular hypertension,  Rhythm:Regular Rate:Normal  ECHO 2025:   1. Left ventricular ejection fraction, by estimation, is 65 to 70%. The left ventricle has normal function. The left ventricle has no regional wall motion abnormalities. Left ventricular diastolic parameters were normal.  2. Right ventricular systolic function is normal. The right ventricular size is normal. There is normal pulmonary artery systolic pressure.  3. Left atrial size was mildly dilated.  4. The mitral valve is normal in structure. Trivial mitral valve regurgitation. No evidence of mitral stenosis.  5. The aortic valve is tricuspid. There is mild calcification of the aortic valve. Aortic valve regurgitation is not visualized. Aortic valve sclerosis/calcification is present, without any evidence of aortic stenosis.  6. The inferior vena cava is dilated in size with >50% respiratory variability, suggesting right atrial pressure of 8 mmHg.   Comparison(s): No significant change from prior study. Prior images reviewed side by side. Throughout the study the rhythm was sinus bradycardia, 45-50 bpm.    Neuro/Psych   Anxiety     CVA, No Residual Symptoms    GI/Hepatic Neg liver ROS,GERD  ,,  Endo/Other  diabetes, Type 2, Oral Hypoglycemic Agents    Renal/GU   negative genitourinary   Musculoskeletal Femoral neck fx   Abdominal Normal abdominal exam  (+)   Peds  Hematology  (+) Blood dyscrasia, anemia Lab Results      Component                Value                Date                      WBC                      5.2                 02/10/2024                HGB                      9.1 (L)             02/10/2024                HCT                      28.3 (L)            02/10/2024                MCV                      89.3                02/10/2024                PLT  99 (L)              02/10/2024              Anesthesia Other Findings   Reproductive/Obstetrics                              Anesthesia Physical Anesthesia Plan  ASA: 3  Anesthesia Plan: General   Post-op Pain Management:    Induction: Intravenous and Rapid sequence  PONV Risk Score and Plan: 2 and Ondansetron , Dexamethasone and Treatment may vary due to age or medical condition  Airway Management Planned: Mask, Oral ETT and Video Laryngoscope Planned  Additional Equipment: None  Intra-op Plan:   Post-operative Plan: Extubation in OR  Informed Consent: I have reviewed the patients History and Physical, chart, labs and discussed the procedure including the risks, benefits and alternatives for the proposed anesthesia with the patient or authorized representative who has indicated his/her understanding and acceptance.     Dental advisory given  Plan Discussed with: CRNA  Anesthesia Plan Comments:         Anesthesia Quick Evaluation

## 2024-02-10 NOTE — Hospital Course (Addendum)
 73yo with h/o MGUS, CVA, HTN, and DW who presented on 10/19 with a fall resulting in elbow and hip fractures (possibly pathologic fractures in the setting of MGUS). He underwent ORIF of elbow fracture on 10/20 and total hip arthroplasty on 10/22.  Medically stable for STR when SNF bed is available and insurance authorization is obtained.

## 2024-02-10 NOTE — Anesthesia Procedure Notes (Signed)
 Procedure Name: Intubation Date/Time: 02/10/2024 1:52 PM  Performed by: Erick Fitz, CRNAPre-anesthesia Checklist: Patient identified, Emergency Drugs available, Suction available, Patient being monitored and Timeout performed Patient Re-evaluated:Patient Re-evaluated prior to induction Oxygen Delivery Method: Circle system utilized Preoxygenation: Pre-oxygenation with 100% oxygen Induction Type: IV induction Ventilation: Mask ventilation without difficulty Laryngoscope Size: Glidescope (GlideScope S4) Grade View: Grade I Tube type: Oral Tube size: 7.5 mm Number of attempts: 1 Airway Equipment and Method: Stylet Placement Confirmation: ETT inserted through vocal cords under direct vision, positive ETCO2, CO2 detector and breath sounds checked- equal and bilateral Secured at: 23 cm Tube secured with: Tape (secured with pink Hy-tape) Dental Injury: Teeth and Oropharynx as per pre-operative assessment

## 2024-02-10 NOTE — Progress Notes (Signed)
 Progress Note   Patient: Dennis Wise FMW:[id number] DOB: [birth year] DOA: 02/07/2024     3 DOS: the patient was seen and examined on 02/10/2024   Brief hospital course: 73yo with h/o MGUS, CVA, HTN, and DW who presented on 10/19 with a fall resulting in elbow and hip fractures (possibly pathologic fractures in the setting of MGUS). He underwent ORIF of elbow fracture on 10/20 and total hip arthroplasty on 10/22.    Assessment and Plan:  Fall with multiple fractures Syncopal episode resulting in multiple fractures Possible pathologic fracture(s) associated with MGUS L forehead lac repaired with tissue adhesive No acute injury to C spine - s/p decompression laminectomies C3-C6 and bilateral posterolateral spinal fusion from C2 through T2 Low 25-OH vitamin D, needs supplement Follow outpatient with PCP to consider bisphosphonate Multimodal pain control with tylenol  TID, robaxin TID, gabapentin TID, and oxycodone 5mg  q4h prn  L hip fracture CT L hip with comminuted fracture of the L femoral neck and intertrochanteric region with mild varus angulation  Underwent L THA on 10/22 Will need post-operative PT/OT consults Appears likely to need SNF rehab given circumstances  L elbow fracture, R Distal Second Metacarpal Fracture Plain films of L elbow with osteopenia with endosteal scalloping in the distal humerus and visualized portions of the radius and ulna (concerning for pathologic process) Intraarticular fracture of proximal ulna, angulated appearance of radial neck (fx not excluded) Age indeterminate angulated fx of distal 2nd metacarpal Underwent ORIF of elbow fracture on 10/20  AKI on CKD IIIa Baseline creatinine 1.3 Attempt to avoid nephrotoxic medications Recheck BMP in AM    Syncope Reported to be positional after addition of new BP medication a few weeks ago PT/OT consulted; apprec eval/recs Orthostatic vitals tomorrow AM Monitor on telemetry for now EEG without seizure or  epileptiform discharges (right temporooccipital cortical dysfunction likely secondary to underlying structural abnromality/stroke) Echo unremarkable Consider cardiac event monitor   HTN Hold hydrochlorothiazide , losartan   Post Op Anemia Transfused 2 units PRBC per orthopedics Appears improved today Recheck CBC in AM   Hx MGUS Needs heme onc follow up outpatient    Recent C2-T2 Fusion Ortho reached out to [place] for Dr. Dow Per 10/10 note, continue hard collar another 3 weeks until follow up with Dr. Dow    Hx Stroke Old R temporooccipital infarct with encephalomalacia and ex vacuo dilation of R lateral ventricle Plavix  on hold  Continue statin   T2DM Glimeperide and actos  and metformin  on hold A1c 6.3, good control Cover with moderate-scale SSI   Dyslipidemia Niacin  on hold Continue atorvastatin     Mood disorder Continue Lexapro      Consultants: Orthopedics  Procedures: ORIF elbow 10/20 THA 10/22  Antibiotics: Cefazolin x 4 doses  30 Day Unplanned Readmission Risk Score    Flowsheet Row ED to Hosp-Admission (Current) from 02/07/2024 in [facility]  30 Day Unplanned Readmission Risk Score (%) 16.02 Filed at 02/10/2024 0400    This score is the patient's risk of an unplanned readmission within 30 days of being discharged (0 -100%). The score is based on dignosis, age, lab data, medications, orders, and past utilization.   Low:  0-14.9   Medium: 15-21.9   High: 22-29.9   Extreme: 30 and above           Subjective: Seen in PACU.  Hard C-collar in place, L elbow splinted post-operatively, now s/p L hip arthroplasty.   Objective: Vitals:   02/10/24 1745 02/10/24 1752  BP: (!) 181/71   Pulse: 63 66  Resp: 15 16  Temp:    SpO2: 93% 97%    Intake/Output Summary (Last 24 hours) at 02/10/2024 1806 Last data filed at 02/10/2024 1639 Gross per 24 hour  Intake 2182.83 ml  Output 650 ml  Net 1532.83 ml    Filed Weights   02/07/24 0622 02/08/24 1047  Weight: 105.7 kg 105.7 kg    Exam:  General:  Appears calm and comfortable and is in NAD; hard C-collar in place Eyes:  normal lids, iris; lac above L eyebrow ENT:  hard of hearing, grossly normal lips & tongue, mmm Cardiovascular:  RRR. No LE edema.  Respiratory:   CTA bilaterally with no wheezes/rales/rhonchi.  Normal respiratory effort. Abdomen:  soft, NT, ND Skin:  scattered traumatic injuries Musculoskeletal:  LUE splinted, L hip with surgical bandage C/D/I; able to wiggle fingers and toes Psychiatric:  blunted mood and affect, speech fluent and appropriate, AOx3 Neurologic:  CN 2-12 grossly intact, moves all extremities in coordinated fashion  Data Reviewed: I have reviewed the patient's lab results since admission.  Pertinent labs for today include:   Glucose 208 BUN 51/Creatinine 1.59/GFR 45 Albumin 3.3 WBC 5.2  Hgb 9.1 Platelets 99   Family Communication: None present; orthopedics to speak with family post-operatively  Mobility: PT/OT Consulted      Code Status: Full Code    Disposition: Status is: Inpatient Remains inpatient appropriate because: ongoing management     Time spent: 50 minutes  Unresulted Labs (From admission, onward)     Start     Ordered   Signed and Held  Comprehensive metabolic panel with GFR  Tomorrow morning,   R        Signed and Held   Signed and Held  CBC  Tomorrow morning,   R        Signed and Held             Author: Delon Herald, MD 02/10/2024 6:06 PM  For on call review [email or web address].

## 2024-02-10 NOTE — Plan of Care (Signed)

## 2024-02-10 NOTE — Progress Notes (Signed)
 Orthopedic Tech Progress Note Patient Details:  CHRISS MANNAN [birth year] [id number] Applied bucks traction per order.  Ortho Devices Type of Ortho Device: Long arm splint Ortho Device/Splint Location: LLE Ortho Device/Splint Interventions: Ordered, Application, Adjustment   Post Interventions Patient Tolerated: Well Instructions Provided: Adjustment of device, Care of device, Poper ambulation with device  Musculoskeletal Traction Type of Traction: Bucks Skin Traction Traction Location: femur Traction Weight: 10 lbs   Post Interventions Patient Tolerated: Well Instructions Provided: Adjustment of device, Care of device, Poper ambulation with device  Morna Pink 02/10/2024, 12:57 AM

## 2024-02-10 NOTE — Discharge Instructions (Addendum)
 INSTRUCTIONS AFTER JOINT REPLACEMENT   Remove items at home which could result in a fall. This includes throw rugs or furniture in walking pathways ICE to the affected joint every three hours while awake for 30 minutes at a time, for at least the first 3-5 days, and then as needed for pain and swelling.  Continue to use ice for pain and swelling. You may notice swelling that will progress down to the foot and ankle.  This is normal after surgery.  Elevate your leg when you are not up walking on it.   Continue to use the breathing machine you got in the hospital (incentive spirometer) which will help keep your temperature down.  It is common for your temperature to cycle up and down following surgery, especially at night when you are not up moving around and exerting yourself.  The breathing machine keeps your lungs expanded and your temperature down.  DIET:  As you were doing prior to hospitalization, we recommend a well-balanced diet.  DRESSING / WOUND CARE / SHOWERING:  Keep the surgical dressing until follow up.  The dressing is water proof, so you can shower without any extra covering.  IF THE DRESSING FALLS OFF or the wound gets wet inside, change the dressing with sterile gauze.  Please use good hand washing techniques before changing the dressing.  Do not use any lotions or creams on the incision until instructed by your surgeon.    ACTIVITY  Increase activity slowly as tolerated, but follow the weight bearing instructions below.   No driving for 6 weeks or until further direction given by your physician.  You cannot drive while taking narcotics.  No lifting or carrying greater than 10 lbs. until further directed by your surgeon. Avoid periods of inactivity such as sitting longer than an hour when not asleep. This helps prevent blood clots.  You may return to work once you are authorized by your doctor.   WEIGHT BEARING: Weight bearing as tolerated with assist device (walker, cane, etc) as  directed, use it as long as suggested by your surgeon or therapist, typically at least 4-6 weeks.  EXERCISES  Results after joint replacement surgery are often greatly improved when you follow the exercise, range of motion and muscle strengthening exercises prescribed by your doctor. Safety measures are also important to protect the joint from further injury. Any time any of these exercises cause you to have increased pain or swelling, decrease what you are doing until you are comfortable again and then slowly increase them. If you have problems or questions, call your caregiver or physical therapist for advice.   Rehabilitation is important following a joint replacement. After just a few days of immobilization, the muscles of the leg can become weakened and shrink (atrophy).  These exercises are designed to build up the tone and strength of the thigh and leg muscles and to improve motion. Often times heat used for twenty to thirty minutes before working out will loosen up your tissues and help with improving the range of motion but do not use heat for the first two weeks following surgery (sometimes heat can increase post-operative swelling).   These exercises can be done on a training (exercise) mat, on the floor, on a table or on a bed. Use whatever works the best and is most comfortable for you.    Use music or television while you are exercising so that the exercises are a pleasant break in your day. This will make your life  better with the exercises acting as a break in your routine that you can look forward to.   Perform all exercises about fifteen times, three times per day or as directed.  You should exercise both the operative leg and the other leg as well.  Exercises include:   Quad Sets - Tighten up the muscle on the front of the thigh (Quad) and hold for 5-10 seconds.   Straight Leg Raises - With your knee straight (if you were given a brace, keep it on), lift the leg to 60 degrees, hold  for 3 seconds, and slowly lower the leg.  Perform this exercise against resistance later as your leg gets stronger.  Leg Slides: Lying on your back, slowly slide your foot toward your buttocks, bending your knee up off the floor (only go as far as is comfortable). Then slowly slide your foot back down until your leg is flat on the floor again.  Angel Wings: Lying on your back spread your legs to the side as far apart as you can without causing discomfort.  Hamstring Strength:  Lying on your back, push your heel against the floor with your leg straight by tightening up the muscles of your buttocks.  Repeat, but this time bend your knee to a comfortable angle, and push your heel against the floor.  You may put a pillow under the heel to make it more comfortable if necessary.   A rehabilitation program following joint replacement surgery can speed recovery and prevent re-injury in the future due to weakened muscles. Contact your doctor or a physical therapist for more information on knee rehabilitation.   CONSTIPATION:  Constipation is defined medically as fewer than three stools per week and severe constipation as less than one stool per week.  Even if you have a regular bowel pattern at home, your normal regimen is likely to be disrupted due to multiple reasons following surgery.  Combination of anesthesia, postoperative narcotics, change in appetite and fluid intake all can affect your bowels.   YOU MUST use at least one of the following options; they are listed in order of increasing strength to get the job done.  They are all available over the counter, and you may need to use some, POSSIBLY even all of these options:    Drink plenty of fluids (prune juice may be helpful) and high fiber foods Colace 100 mg by mouth twice a day  Senokot for constipation as directed and as needed Dulcolax (bisacodyl), take with full glass of water  Miralax (polyethylene glycol) once or twice a day as needed.  If you  have tried all these things and are unable to have a bowel movement in the first 3-4 days after surgery call either your surgeon or your primary doctor.    If you experience loose stools or diarrhea, hold the medications until you stool forms back up.  If your symptoms do not get better within 1 week or if they get worse, check with your doctor.  If you experience the worst abdominal pain ever or develop nausea or vomiting, please contact the office immediately for further recommendations for treatment.  ITCHING:  If you experience itching with your medications, try taking only a single pain pill, or even half a pain pill at a time.  You can also use Benadryl over the counter for itching or also to help with sleep.   TED HOSE STOCKINGS:  Use stockings on both legs until for at least 2 weeks or  as directed by physician office. They may be removed at night for sleeping.  MEDICATIONS:  See your medication summary on the "After Visit Summary" that nursing will review with you.  You may have some home medications which will be placed on hold until you complete the course of blood thinner medication.  It is important for you to complete the blood thinner medication as prescribed.  Blood clot prevention (DVT Prophylaxis): After surgery you are at an increased risk for a blood clot.  You were prescribed a blood thinner, Aspirin  81mg , to be taken twice daily for a total of 4 weeks from surgery to help reduce your risk of getting a blood clot.  This is in addition to resuming your Plavix  starting post-operative day 2, or Friday 10/24, and are to continue taking it as normally prescribed.  Signs of a pulmonary embolus (blood clot in the lungs) include sudden short of breath, feeling lightheaded or dizzy, chest pain with a deep breath, rapid pulse rapid breathing.  Signs of a blood clot in your arms or legs include new unexplained swelling and cramping, warm, red or darkened skin around the painful area.  Please  call the office or 911 right away if these signs or symptoms develop.  PRECAUTIONS:   If you experience chest pain or shortness of breath - call 911 immediately for transfer to the hospital emergency department.   If you develop a fever greater that 101 F, purulent drainage from wound, increased redness or drainage from wound, foul odor from the wound/dressing, or calf pain - CONTACT YOUR SURGEON.                                                   FOLLOW-UP APPOINTMENTS:  If you do not already have a post-op appointment, please call the office for an appointment to be seen by your surgeon.  Guidelines for how soon to be seen are listed in your "After Visit Summary", but are typically between 2-3 weeks after surgery.  If you have a specialized bandage, you may be told to follow up 1 week after surgery.  POST-OPERATIVE OPIOID TAPER INSTRUCTIONS: It is important to wean off of your opioid medication as soon as possible. If you do not need pain medication after your surgery it is ok to stop day one. Opioids include: Codeine, Hydrocodone(Norco, Vicodin), Oxycodone(Percocet, oxycontin) and hydromorphone amongst others.  Long term and even short term use of opiods can cause: Increased pain response Dependence Constipation Depression Respiratory depression And more.  Withdrawal symptoms can include Flu like symptoms Nausea, vomiting And more Techniques to manage these symptoms Hydrate well Eat regular healthy meals Stay active Use relaxation techniques(deep breathing, meditating, yoga) Do Not substitute Alcohol to help with tapering If you have been on opioids for less than two weeks and do not have pain than it is ok to stop all together.  Plan to wean off of opioids This plan should start within one week post op of your joint replacement. Maintain the same interval or time between taking each dose and first decrease the dose.  Cut the total daily intake of opioids by one tablet each  day Next start to increase the time between doses. The last dose that should be eliminated is the evening dose.   MAKE SURE YOU:  Understand these instructions.  Get help right  away if you are not doing well or get worse.    Thank you for letting us  be a part of your medical care team.  It is a privilege we respect greatly.  We hope these instructions will help you stay on track for a fast and full recovery!

## 2024-02-10 NOTE — Inpatient Diabetes Management (Signed)
 Inpatient Diabetes Program Recommendations  AACE/ADA: New Consensus Statement on Inpatient Glycemic Control   Target Ranges:  Prepandial:   less than 140 mg/dL      Peak postprandial:   less than 180 mg/dL (1-2 hours)      Critically ill patients:  140 - 180 mg/dL   Lab Results  Component Value Date   GLUCAP 211 (H) 02/10/2024   HGBA1C 6.3 (A) 02/04/2024    Latest Reference Range & Units 02/09/24 08:12 02/09/24 11:50 02/09/24 17:42 02/09/24 21:45 02/10/24 09:05  Glucose-Capillary 70 - 99 mg/dL 722 (H) 744 (H) 763 (H) 166 (H) 211 (H)   Review of Glycemic Control  Diabetes history: DM2  Outpatient Diabetes medications: Amaryl  2mg  daily  Metformin  850mg  BID  Actos  45mg  daily   Current orders for Inpatient glycemic control:  Novolog  0-15 units TID  Novolog  2 units TID with meals   Inpatient Diabetes Program Recommendations:  Please consider adding Semglee 10 units daily.   Thanks,  Lavanda Search, RN, MSN, [facility]  Inpatient Diabetes Coordinator  Pager [phone] (8a-5p)

## 2024-02-11 ENCOUNTER — Encounter (HOSPITAL_COMMUNITY): Payer: Self-pay | Admitting: Orthopedic Surgery

## 2024-02-11 ENCOUNTER — Inpatient Hospital Stay: Admitting: Oncology

## 2024-02-11 DIAGNOSIS — S72002A Fracture of unspecified part of neck of left femur, initial encounter for closed fracture: Secondary | ICD-10-CM | POA: Diagnosis not present

## 2024-02-11 LAB — COMPREHENSIVE METABOLIC PANEL WITH GFR
ALT: <5 U/L (ref 0–44)
AST: 24 U/L (ref 15–41)
Albumin: 3.1 g/dL — ABNORMAL LOW (ref 3.5–5.0)
Alkaline Phosphatase: 95 U/L (ref 38–126)
Anion gap: 8 (ref 5–15)
BUN: 42 mg/dL — ABNORMAL HIGH (ref 8–23)
CO2: 25 mmol/L (ref 22–32)
Calcium: 8.5 mg/dL — ABNORMAL LOW (ref 8.9–10.3)
Chloride: 103 mmol/L (ref 98–111)
Creatinine, Ser: 1.39 mg/dL — ABNORMAL HIGH (ref 0.61–1.24)
GFR, Estimated: 53 mL/min — ABNORMAL LOW (ref >60)
Glucose, Bld: 231 mg/dL — ABNORMAL HIGH (ref 70–99)
Potassium: 4.6 mmol/L (ref 3.5–5.1)
Sodium: 136 mmol/L (ref 135–145)
Total Bilirubin: 0.4 mg/dL (ref 0.0–1.2)
Total Protein: 5.5 g/dL — ABNORMAL LOW (ref 6.5–8.1)

## 2024-02-11 LAB — CBC
HCT: 25.7 % — ABNORMAL LOW (ref 39.0–52.0)
Hemoglobin: 8.2 g/dL — ABNORMAL LOW (ref 13.0–17.0)
MCH: 28.6 pg (ref 26.0–34.0)
MCHC: 31.9 g/dL (ref 30.0–36.0)
MCV: 89.5 fL (ref 80.0–100.0)
Platelets: 98 K/uL — ABNORMAL LOW (ref 150–400)
RBC: 2.87 MIL/uL — ABNORMAL LOW (ref 4.22–5.81)
RDW: 15.1 % (ref 11.5–15.5)
WBC: 6.5 K/uL (ref 4.0–10.5)
nRBC: 0 % (ref 0.0–0.2)

## 2024-02-11 LAB — GLUCOSE, CAPILLARY
Glucose-Capillary: 225 mg/dL — ABNORMAL HIGH (ref 70–99)
Glucose-Capillary: 261 mg/dL — ABNORMAL HIGH (ref 70–99)
Glucose-Capillary: 232 mg/dL — ABNORMAL HIGH (ref 70–99)
Glucose-Capillary: 175 mg/dL — ABNORMAL HIGH (ref 70–99)

## 2024-02-11 LAB — TYPE AND SCREEN
ABO/RH(D): B POS
Antibody Screen: NEGATIVE
Unit division: 0

## 2024-02-11 LAB — BPAM RBC
Blood Product Expiration Date: 202511252359
ISSUE DATE / TIME: 202510220028
Unit Type and Rh: 7300

## 2024-02-11 MED ORDER — OXYCODONE HCL 5 MG PO TABS: 5.0000 mg | ORAL_TABLET | ORAL | 0 refills | Status: AC | PRN

## 2024-02-11 MED ORDER — ASPIRIN 81 MG PO TBEC: 81.0000 mg | DELAYED_RELEASE_TABLET | Freq: Two times a day (BID) | ORAL | Status: AC

## 2024-02-11 NOTE — Evaluation (Signed)
 Physical Therapy Evaluation Patient Details Name: Dennis Wise MRN: [id number] DOB: 02-03-1951 Today's Date: 02/11/2024  History of Present Illness  Patient is a 73 year old male who is s/p C2-T2 surgery and receiving home health PT services who presented to the ED with pain in left hip and elbow after experiencing a syncopal episode and falling at home.  Dx with comminuted posterior proximal intraarticular ulnar Fx, comminuted left proximal femur Fx, and right metacarpal II Fx.  Patient has undergone ORIF of left elbow 10/20 (NWB, splint, platform walker OK) and THA of left hip 10/22 (PWB, posterior precautions).  PMHx includes DM II with retinopathy, HTN, HLD, syncope, bilateral carotid stenosis, ischemic right PCA CVA, fatty liver disease, left knee Fx (ORIF, 2004), left tibia Fx (ORIF, 2012), anxiety, mild vascular neurocog  Clinical Impression  Patient presents with decreased mobility due to pain, limited activity tolerance, decreased awareness of precautions, decreased balance and generalized weakness.  Previously ambulatory with device at home and spouse assist some with bathing since neck surgery.  Patient today unable to stand with +3 A due to L hip pain and limited weight bearing allowed.  Needing +2 total A to get to EOB.  Feel he will benefit from skilled PT in the acute setting and from post-acute inpatient rehab (<3 hours/day) prior to d/c home.         If plan is discharge home, recommend the following: Two people to help with bathing/dressing/bathroom;Two people to help with walking and/or transfers;Assist for transportation   Can travel by private vehicle   No    Equipment Recommendations Other (comment) (TBA)  Recommendations for Other Services       Functional Status Assessment Patient has had a recent decline in their functional status and demonstrates the ability to make significant improvements in function in a reasonable and predictable amount of time.     Precautions  / Restrictions Precautions Precautions: Fall;Cervical Recall of Precautions/Restrictions: Impaired Required Braces or Orthoses: Cervical Brace Cervical Brace: Hard collar;At all times Restrictions Weight Bearing Restrictions Per Provider Order: Yes LUE Weight Bearing Per Provider Order: Weight bear through elbow only LLE Weight Bearing Per Provider Order: Partial weight bearing LLE Partial Weight Bearing Percentage or Pounds: 50%      Mobility  Bed Mobility Overal bed mobility: Needs Assistance Bed Mobility: Supine to Sit, Sit to Supine     Supine to sit: +2 for physical assistance, Total assist, HOB elevated Sit to supine: Total assist, +2 for physical assistance   General bed mobility comments: cues to initiate moving legs to EOB, assist finally to move both legs and lift to sit with helicopter technique with bed pad; then to supine assist for legs and trunk and to scoot to [facility]    Transfers Overall transfer level: Needs assistance Equipment used: Left platform walker Transfers: Sit to/from Stand Sit to Stand: From elevated surface, Total assist, +2 physical assistance           General transfer comment: attempted with +2-3 A without success for full stand, did achieve liftoff and maintaining weight bearing precautions    Ambulation/Gait               General Gait Details: unable  Stairs            Wheelchair Mobility     Tilt Bed    Modified Rankin (Stroke Patients Only)       Balance Overall balance assessment: Needs assistance Sitting-balance support: Feet supported Sitting balance-Leahy Scale: Poor  Sitting balance - Comments: posterior leaning initially finally able to sit with close S after about 5 minutes Postural control: Posterior lean     Standing balance comment: unable to stand today                             Pertinent Vitals/Pain Pain Assessment Pain Assessment: Faces Faces Pain Scale: Hurts worst Pain  Location: L hip with mobility Pain Descriptors / Indicators: Grimacing, Guarding, Discomfort, Aching, Sore, Operative site guarding Pain Intervention(s): Monitored during session, Repositioned, Premedicated before session, Ice applied    Home Living Family/patient expects to be discharged to:: Private residence Living Arrangements: Spouse/significant other Available Help at Discharge: Family Type of Home: House Home Access: Stairs to enter Entrance Stairs-Rails: Left Entrance Stairs-Number of Steps: 2   Home Layout: One level Home Equipment: Pharmacist, hospital (2 wheels);Grab bars - toilet;Hand held shower head (rail on the wall in one bathroom)      Prior Function               Mobility Comments: walker since neck surgery, able to get into bed by himself prior to this fall; had HHPT ADLs Comments: was going to bathroom by himself till this fall     Extremity/Trunk Assessment   Upper Extremity Assessment Upper Extremity Assessment: Defer to OT evaluation    Lower Extremity Assessment Lower Extremity Assessment: RLE deficits/detail;LLE deficits/detail RLE Deficits / Details: A/AAROM WFL though reports pain in L hip with R LE elevation; intact ankle AROM, and knee extension at least 3/10 LLE Deficits / Details: PROM limited hip and knee flexion with pain about 30 degrees in supine, difficulty attempting to initiate quad activation; ankle AROM WFL    Cervical / Trunk Assessment Cervical / Trunk Assessment: Neck Surgery  Communication   Communication Communication: Impaired Factors Affecting Communication: Hearing impaired    Cognition Arousal: Alert Behavior During Therapy: WFL for tasks assessed/performed   PT - Cognitive impairments: Attention, Initiation, Problem solving, Safety/Judgement                       PT - Cognition Comments: needs frequent repeated instructions, some limited attention to task and slow processing Following commands:  Impaired Following commands impaired: Follows one step commands with increased time, Follows one step commands inconsistently     Cueing Cueing Techniques: Verbal cues, Tactile cues     General Comments General comments (skin integrity, edema, etc.): wife present and supportive, pt on 2L O2 with SpO2 97%; HR 66, BP 123/39 sitting EOB no c/o dizziness    Exercises Total Joint Exercises Ankle Circles/Pumps: AROM, 5 reps, Supine, Both Quad Sets: PROM (attempted to initiate with multimodal cues, pt unable) Hip ABduction/ADduction: AAROM, Left, 5 reps, Supine   Assessment/Plan    PT Assessment Patient needs continued PT services  PT Problem List Decreased strength;Decreased range of motion;Decreased activity tolerance;Decreased knowledge of precautions;Decreased balance;Decreased mobility;Decreased knowledge of use of DME;Decreased cognition       PT Treatment Interventions DME instruction;Gait training;Patient/family education;Functional mobility training;Therapeutic activities;Therapeutic exercise;Balance training    PT Goals (Current goals can be found in the Care Plan section)  Acute Rehab PT Goals Patient Stated Goal: agreeable to rehab PT Goal Formulation: With patient/family Time For Goal Achievement: 02/25/24 Potential to Achieve Goals: Good    Frequency Min 2X/week     Co-evaluation PT/OT/SLP Co-Evaluation/Treatment: Yes Reason for Co-Treatment: For patient/therapist safety;To address functional/ADL transfers;Complexity of the  patient's impairments (multi-system involvement) PT goals addressed during session: Mobility/safety with mobility;Balance;Proper use of DME         AM-PAC PT 6 Clicks Mobility  Outcome Measure Help needed turning from your back to your side while in a flat bed without using bedrails?: Total Help needed moving from lying on your back to sitting on the side of a flat bed without using bedrails?: Total Help needed moving to and from a bed to a  chair (including a wheelchair)?: Total Help needed standing up from a chair using your arms (e.g., wheelchair or bedside chair)?: Total Help needed to walk in hospital room?: Total Help needed climbing 3-5 steps with a railing? : Total 6 Click Score: 6    End of Session Equipment Utilized During Treatment: Gait belt;Cervical collar Activity Tolerance: Patient limited by pain;Patient limited by fatigue Patient left: in bed;with family/visitor present;with call bell/phone within reach   PT Visit Diagnosis: Other abnormalities of gait and mobility (R26.89);History of falling (Z91.81);Muscle weakness (generalized) (M62.81);Pain Pain - Right/Left: Left Pain - part of body: Hip    Time: 8865-8780 PT Time Calculation (min) (ACUTE ONLY): 45 min   Charges:   PT Evaluation $PT Eval Moderate Complexity: 1 Mod PT Treatments $Therapeutic Activity: 8-22 mins PT General Charges $$ ACUTE PT VISIT: 1 Visit         Micheline Portal, PT Acute Rehabilitation Services [phone] 02/11/2024   Montie Portal 02/11/2024, 1:49 PM

## 2024-02-11 NOTE — Evaluation (Signed)
 Occupational Therapy Evaluation Patient Details Name: Dennis Wise MRN: [id number] DOB: January 26, 1951 Today's Date: 02/11/2024   History of Present Illness   Patient is a 73 year old male who presented to the ED with pain in left hip and elbow after experiencing a syncopal episode and falling at home.  Found to have a L olecranon fracture, comminuted left femoral neck fracture, and right metacarpal II fracture.  Patient has undergone ORIF of left elbow 02/08/24 (NWB, splint) and THA of left hip 02/10/24 (50%/PWB, posterior hip precautions). Of note, he also recently had a C2-T2 fusion in Sept 2025 at an outside facility.   PMH: DM II with diabetic retinopathy, HTN, HLD, syncope, bilateral carotid stenosis, ischemic right PCA CVA, fatty liver disease, left knee fracture (ORIF, 2004), left tibia fracture (ORIF, 2012), anxiety, mild vascular neurocognitive disorder     Clinical Impressions The pt is currently presenting significantly below his baseline level of functioning for self-care management. He is limited by the below listed deficits (see OT problem list). As such, his occupational performance is compromised and he requires significant assistance for self-care management. During the session today, he required total assist x2 to 3 for bed mobility, total assist to donn his socks, and moderate assist for self-feeding in bed. Once seated EOB, he initially presented with poor sitting balance, however he improved to needing CGA in this regard, after about 5 minutes. He was unable to fully clear his buttocks off the bed after two sit to stand attempts using a platform walker. His overall occupational performance is also impacted, due to his impaired vision (L homonymous hemianopsia from prior CVA), L hand edema, LUE NWB through elbow status, shoulder AROM limitations, and LLE PWB status. Further OT services are needed to maximize his safety and independence with self-care tasks and to decrease the risk for  further weakness and deconditioning. Patient will benefit from continued inpatient follow up therapy, <3 hours/day.      If plan is discharge home, recommend the following:   Two people to help with walking and/or transfers;Two people to help with bathing/dressing/bathroom;Help with stairs or ramp for entrance;Assist for transportation;Assistance with feeding;Assistance with cooking/housework     Functional Status Assessment   Patient has had a recent decline in their functional status and demonstrates the ability to make significant improvements in function in a reasonable and predictable amount of time.     Equipment Recommendations   None recommended by OT     Recommendations for Other Services         Precautions/Restrictions   Precautions Precautions: Fall;Cervical Recall of Precautions/Restrictions: Impaired Required Braces or Orthoses: Cervical Brace ([place]) Cervical Brace: Hard collar;At all times Restrictions LLE Weight Bearing Per Provider Order: Partial weight bearing LLE Partial Weight Bearing Percentage or Pounds: 50% Other Position/Activity Restrictions: LUE precautions: NWB L elbow, Ok to use platform walker, Unrestricted ROM digits, wrist and shoulder  No elbow motion as he is splinted,  Splint to remain on x 2 weeks, Start passive extension in 2 weeks, Active extension against gravity in 6 weeks  Active extension against resistance in 8 weeks , Ice and elevate     Mobility Bed Mobility Overal bed mobility: Needs Assistance Bed Mobility: Supine to Sit, Sit to Supine     Supine to sit: +2 for physical assistance, Total assist, HOB elevated Sit to supine: Total assist, +2 for physical assistance   General bed mobility comments: cues to initiate moving legs to EOB, assist finally to move both  legs and lift to sit with helicopter technique with bed pad; then to supine assist for legs and trunk and to scoot to [facility]    Transfers Overall transfer  level: Needs assistance Equipment used: Left platform walker Transfers: Sit to/from Stand Sit to Stand: From elevated surface, Total assist, +2 physical assistance           General transfer comment: attempted with +2-3 assist without success for full stand, did achieve partial lift off       Balance Overall balance assessment: Needs assistance   Sitting balance-Leahy Scale: Initially poor. Improved to fair- after several minutes. Sitting balance - Comments: posterior leaning initially finally able to sit with CGA after ~5 minutes       Standing balance comment: unable to stand today              ADL either performed or assessed with clinical judgement   ADL Overall ADL's : Needs assistance/impaired Eating/Feeding: Moderate assistance;Bed level;Cueing for compensatory techinques Eating/Feeding Details (indicate cue type and reason): Pt had difficulty with self-feeding in bed, given his vision impairment, decreased BUE shoulder AROM, impaired fine motor coordination, and edema of L hand. He was instructed on using built-up utensils to assist with improved grasp of feeding utensils. He was also instructed on RUE proppinng and proximal support to facilitate improved ability to access food on his tray. Verbal and tactile cues was needed to identify location of food items on his tray, due to his impaired vision. Grooming: Moderate assistance;Cueing for compensatory techniques;Cueing for sequencing;Bed level       Lower Body Bathing: Moderate assistance;Bed level   Upper Body Dressing : Maximal assistance;Bed level   Lower Body Dressing: Total assistance;Bed level       Toileting- Clothing Manipulation and Hygiene: Total assistance;Bed level               Vision   Additional Comments: Baseline L homonymous hemianopsia as a result of a prior CVA            Pertinent Vitals/Pain Pain Assessment Pain Assessment: Faces Pain Score: 10-Worst pain ever Pain Location:  L hip with mobility Pain Descriptors / Indicators: Grimacing, Guarding, Discomfort, Aching, Sore, Operative site guarding Pain Intervention(s): Monitored during session, Premedicated before session, Repositioned     Extremity/Trunk Assessment Upper Extremity Assessment Upper Extremity Assessment: Right hand dominant;RUE deficits/detail;LUE deficits/detail RUE Deficits / Details: AROM for shoulder flexion limited to ~3/4 normal AROM. Elbow AROM WFL. Able to demo gross hand grasp. Decreased fine grasp. Grip strength grossly 3/5 RUE Coordination: decreased fine motor LUE Deficits / Details: Severe chronic shoulder AROM limitations, with AROM for shoulder flexion being <1/2 normal AROM. Elbow not assessed, due to precautions and applied splint. Wrist AROM WFL. Grip strength 3-/5 (edema of hand noted) LUE Coordination: decreased fine motor   Lower Extremity Assessment Lower Extremity Assessment: RLE deficits/detail;LLE deficits/detail RLE Deficits / Details:  (Ankle AROM WFL. Required AAROM for hip; limited by pain and associated guarding) LLE Deficits / Details: PROM limited hip and knee flexion with pain about 30 degrees in supine, difficulty attempting to initiate quad activation; ankle AROM WFL (per PT eval)      Communication Communication Communication: Impaired Factors Affecting Communication: Hearing impaired   Cognition Arousal: Alert Behavior During Therapy: Anxious            Following commands: Impaired Following commands impaired: Follows one step commands with increased time, Follows one step commands inconsistently     Cueing  General  Comments   Cueing Techniques: Verbal cues;Tactile cues  wife present and supportive, pt on 2L O2 with SpO2 97%; HR 66, BP 123/39 sitting EOB no complaint of dizziness           Home Living Family/patient expects to be discharged to:: Private residence Living Arrangements: Spouse/significant other Available Help at Discharge:  Family Type of Home: House Home Access: Stairs to enter Secretary/administrator of Steps: 2 Entrance Stairs-Rails: Left Home Layout: One level     Bathroom Shower/Tub: Producer, television/film/video: Handicapped height     Home Equipment: Pharmacist, hospital (2 wheels);Grab bars - toilet;Hand held shower head          Prior Functioning/Environment Prior Level of Function : Independent/Modified Independent             Mobility Comments:  (Has been using a RW for household ambulation over the past several weeks.) ADLs Comments:  (Modified independent to independent with ADLs, prior to neck surgery ~5 weeks ago. Has been requiring assist for dressing and bathing over the past couple weeks.)    OT Problem List: Decreased strength;Decreased range of motion;Decreased activity tolerance;Impaired balance (sitting and/or standing);Decreased coordination;Decreased knowledge of use of DME or AE;Decreased knowledge of precautions;Pain;Increased edema;Impaired UE functional use   OT Treatment/Interventions: Self-care/ADL training;Therapeutic exercise;Therapeutic activities;Energy conservation;Patient/family education;DME and/or AE instruction;Balance training      OT Goals(Current goals can be found in the care plan section)   Acute Rehab OT Goals OT Goal Formulation: With patient/family Time For Goal Achievement: 02/25/24 Potential to Achieve Goals: Fair ADL Goals Pt Will Perform Eating: with supervision;with set-up;with adaptive utensils;sitting Pt Will Perform Grooming: with set-up;with supervision;sitting;with adaptive equipment Pt Will Perform Upper Body Bathing: with set-up;with supervision;sitting Pt Will Perform Upper Body Dressing: with supervision;with set-up;sitting Additional ADL Goal #1: The pt will perform bed mobility with min assist, in prep for progressive ADL participation.   OT Frequency:  Min 2X/week    Co-evaluation PT/OT/SLP  Co-Evaluation/Treatment: Yes Reason for Co-Treatment: For patient/therapist safety;To address functional/ADL transfers;Complexity of the patient's impairments (multi-system involvement) PT goals addressed during session: Mobility/safety with mobility;Balance;Proper use of DME OT goals addressed during session: ADL's and self-care;Proper use of Adaptive equipment and DME;Strengthening/ROM      AM-PAC OT 6 Clicks Daily Activity     Outcome Measure Help from another person eating meals?: A Lot Help from another person taking care of personal grooming?: A Lot Help from another person toileting, which includes using toliet, bedpan, or urinal?: Total Help from another person bathing (including washing, rinsing, drying)?: A Lot Help from another person to put on and taking off regular upper body clothing?: A Lot Help from another person to put on and taking off regular lower body clothing?: Total 6 Click Score: 10   End of Session Equipment Utilized During Treatment: Gait belt;Other (comment) (platform walker) Nurse Communication: Mobility status  Activity Tolerance: Patient limited by pain Patient left: in bed;with call bell/phone within reach;with bed alarm set;with family/visitor present  OT Visit Diagnosis: Other abnormalities of gait and mobility (R26.89);Muscle weakness (generalized) (M62.81);History of falling (Z91.81);Feeding difficulties (R63.3);Pain Pain - Right/Left: Left Pain - part of body: Hip                Time: 8859-8778 OT Time Calculation (min): 41 min Charges:  OT General Charges $OT Visit: 1 Visit OT Evaluation $OT Eval Moderate Complexity: 1 Mod OT Treatments $Self Care/Home Management : 8-22 mins    Delanna JINNY Lesches, OTR/L 02/11/2024,  4:43 PM

## 2024-02-11 NOTE — Progress Notes (Signed)
 Progress Note   Patient: Dennis Wise FMW:[id number] DOB: 02/16/1951 DOA: 02/07/2024     4 DOS: the patient was seen and examined on 02/11/2024   Brief hospital course: 73yo with h/o MGUS, CVA, HTN, and DW who presented on 10/19 with a fall resulting in elbow and hip fractures (possibly pathologic fractures in the setting of MGUS). He underwent ORIF of elbow fracture on 10/20 and total hip arthroplasty on 10/22.    Assessment and Plan:  Fall with multiple fractures Syncopal episode resulting in multiple fractures Possible pathologic fracture(s) associated with MGUS L forehead lac repaired with tissue adhesive No acute injury to C spine - s/p decompression laminectomies C3-C6 and bilateral posterolateral spinal fusion from C2 through T2 Low 25-OH vitamin D, needs supplement Follow outpatient with PCP to consider bisphosphonate Multimodal pain control with tylenol  TID, robaxin TID, gabapentin TID, and oxycodone 5mg  q4h prn   L hip fracture CT L hip with comminuted fracture of the L femoral neck and intertrochanteric region with mild varus angulation  Underwent L THA on 10/22 Will need post-operative PT/OT consults Appears likely to need SNF rehab given circumstances   L elbow fracture, R Distal Second Metacarpal Fracture Plain films of L elbow with osteopenia with endosteal scalloping in the distal humerus and visualized portions of the radius and ulna (concerning for pathologic process) Intraarticular fracture of proximal ulna, angulated appearance of radial neck (fx not excluded) Age indeterminate angulated fx of distal 2nd metacarpal Underwent ORIF of elbow fracture on 10/20   AKI on CKD IIIa Baseline creatinine 1.3 Attempt to avoid nephrotoxic medications Appears to be back to or near baseline   Syncope Reported to be positional after addition of new BP medication a few weeks ago PT/OT consulted; apprec eval/recs Monitor on telemetry for now EEG without seizure or  epileptiform discharges (right temporooccipital cortical dysfunction likely secondary to underlying structural abnromality/stroke) Echo unremarkable Consider cardiac event monitor   HTN Hold hydrochlorothiazide , losartan    Post Op Anemia Transfused 2 units PRBC per orthopedics Appears improved today Recheck CBC in AM   Hx MGUS Needs heme onc follow up outpatient    Recent C2-T2 Fusion Ortho reached out to [place] for Dr. Dow Per 10/10 note, continue hard collar another 3 weeks until follow up with Dr. Dow    Hx Stroke Old R temporooccipital infarct with encephalomalacia and ex vacuo dilation of R lateral ventricle Plavix  on hold  Continue statin   T2DM Glimeperide and actos  and metformin  on hold A1c 6.3, good control Cover with moderate-scale SSI   Dyslipidemia Niacin  on hold Continue atorvastatin     Mood disorder Continue Lexapro          Consultants: Orthopedics   Procedures: ORIF elbow 10/20 THA 10/22   Antibiotics: Cefazolin x 4 doses  30 Day Unplanned Readmission Risk Score    Flowsheet Row ED to Hosp-Admission (Current) from 02/07/2024 in [facility]  30 Day Unplanned Readmission Risk Score (%) 17.13 Filed at 02/11/2024 0400    This score is the patient's risk of an unplanned readmission within 30 days of being discharged (0 -100%). The score is based on dignosis, age, lab data, medications, orders, and past utilization.   Low:  0-14.9   Medium: 15-21.9   High: 22-29.9   Extreme: 30 and above           Subjective: Patient seen with PT, screaming out in pain.  Otherwise, doing ok.   Objective: Vitals:   02/11/24  9341 02/11/24 1226  BP: (!) 156/51 (!) 122/44  Pulse: 66 61  Resp:  15  Temp:  99.4 F (37.4 C)  SpO2:  95%    Intake/Output Summary (Last 24 hours) at 02/11/2024 1828 Last data filed at 02/11/2024 1822 Gross per 24 hour  Intake 2523.09 ml  Output 1450 ml  Net 1073.09 ml   Filed  Weights   02/07/24 0622 02/08/24 1047  Weight: 105.7 kg 105.7 kg    Exam:  General:  Appears calm and comfortable and is in NAD; hard C-collar in place Eyes:  normal lids, iris; lac above L eyebrow ENT:  hard of hearing, grossly normal lips & tongue, mmm Cardiovascular:  RRR. No LE edema.  Respiratory:   CTA bilaterally with no wheezes/rales/rhonchi.  Normal respiratory effort. Abdomen:  soft, NT, ND Skin:  scattered traumatic injuries, looks better today Musculoskeletal:  LUE splinted, L hip with surgical bandage C/D/I; able to wiggle fingers and toes; able to sit up with PT help (eventually) Psychiatric:  blunted mood and affect, speech fluent and appropriate, AOx3 Neurologic:  CN 2-12 grossly intact, moves all extremities in coordinated fashion  Data Reviewed: I have reviewed the patient's lab results since admission.  Pertinent labs for today include:   Glucose 231 BUN 42/Creatinine 1.39/GFR 53, at/near baseline Albumin 3.1 WBC 6.5 Hgb 8.2 Platelets 98     Family Communication: Wife was present  Mobility: PT/OT Consulted and are recommending - Skilled Nursing-Short Term Rehab (<3 Hours/Day)02/11/2024 1345    Code Status: Full Code    Disposition: Status is: Inpatient Remains inpatient appropriate because: will need STR     Time spent: 50 minutes  Unresulted Labs (From admission, onward)     Start     Ordered   02/12/24 0500  CBC with Differential/Platelet  Tomorrow morning,   R       Question:  Specimen collection method  Answer:  Lab=Lab collect   02/11/24 1828   02/12/24 0500  Basic metabolic panel with GFR  Tomorrow morning,   R       Question:  Specimen collection method  Answer:  Lab=Lab collect   02/11/24 1828             Author: Delon Herald, MD 02/11/2024 6:28 PM  For on call review [email or web address].

## 2024-02-11 NOTE — Progress Notes (Signed)
     Subjective:  Patient reports pain as moderate.  No issues overnight.  Discussed IntraOp findings.  Denies distal numbness and tingling.  Discussed plan for mobilization with therapy today.  Yesterday's total administered Morphine Milligram Equivalents: 110   Objective:   VITALS:   Vitals:   02/10/24 1745 02/10/24 1752 02/10/24 2113 02/11/24 0552  BP: (!) 181/71 (!) 163/72 (!) 124/52 (!) 182/63  Pulse: 63 66 (!) 58 66  Resp: 15 16 18 20   Temp:   98.2 F (36.8 C) 99.8 F (37.7 C)  TempSrc:      SpO2: 93% 97% 92% 99%  Weight:      Height:        Sensation intact distally Intact pulses distally Dorsiflexion/Plantar flexion intact Incision: dressing C/D/I Compartment soft    Lab Results  Component Value Date   WBC 5.2 02/10/2024   HGB 9.1 (L) 02/10/2024   HCT 28.3 (L) 02/10/2024   MCV 89.3 02/10/2024   PLT 99 (L) 02/10/2024   BMET    Component Value Date/Time   NA 137 02/10/2024 0549   NA 138 01/28/2016 0804   K 4.6 02/10/2024 0549   K 4.7 01/28/2016 0804   CL 102 02/10/2024 0549   CL 103 08/30/2012 0901   CO2 24 02/10/2024 0549   CO2 24 01/28/2016 0804   GLUCOSE 208 (H) 02/10/2024 0549   GLUCOSE 263 (H) 01/28/2016 0804   GLUCOSE 155 (H) 08/30/2012 0901   BUN 51 (H) 02/10/2024 0549   BUN 23.4 01/28/2016 0804   CREATININE 1.59 (H) 02/10/2024 0549   CREATININE 1.71 (H) 02/04/2024 0911   CREATININE 1.2 01/28/2016 0804   CALCIUM  8.7 (L) 02/10/2024 0549   CALCIUM  9.1 01/28/2016 0804   EGFR 61 (L) 01/28/2016 0804   GFRNONAA 45 (L) 02/10/2024 0549   GFRNONAA 41 (L) 02/04/2024 0911      Xray: THA components in good position no adverse features  Assessment/Plan: 1 Day Post-Op   Principal Problem:   Closed left hip fracture (HCC)  S/p L THA for femoral neck fracture 10/22  Post op recs: WB: 50% PWB LLE, 6 weeks posterior hip precautions x6 weeks Abx: ancef Imaging: PACU pelvis Xray Dressing: Aquacell, keep intact until follow up DVT  prophylaxis: Aspirin  81BID starting POD1, resume plavix  POD2 Follow up: 2 weeks after surgery for a wound check with Dr. Edna at [facility].  Address: [address], [place], [place] [address]  Office Phone: [phone]   TORIBIO DELENA EDNA 02/11/2024, 6:56 AM   TORIBIO Edna, MD  Contact information:   [phone] 7am-5pm epic message Dr. Edna, or call office for patient follow up: [phone] After hours and holidays please check [email or web address] for group call information for [facility]

## 2024-02-11 NOTE — Inpatient Diabetes Management (Signed)
 Inpatient Diabetes Program Recommendations  AACE/ADA: New Consensus Statement on Inpatient Glycemic Control (2015)  Target Ranges:  Prepandial:   less than 140 mg/dL      Peak postprandial:   less than 180 mg/dL (1-2 hours)      Critically ill patients:  140 - 180 mg/dL   Lab Results  Component Value Date   GLUCAP 232 (H) 02/11/2024   HGBA1C 6.3 (A) 02/04/2024   Review of Glycemic Control   Diabetes history: DM2   Outpatient Diabetes medications: Amaryl  2mg  daily  Metformin  850mg  BID  Actos  45mg  daily    Current orders for Inpatient glycemic control:  Novolog  0-15 units TID  Novolog  2 units TID with meals   Inpatient Diabetes Program Recommendations:    Consider adding Semglee 10 units daily  Needs tight glycemic control for healing.  Thank you. Shona Brandy, RD, LDN, CDCES Inpatient Diabetes Coordinator [phone]

## 2024-02-11 NOTE — Progress Notes (Signed)
 IVTeam consult received for midline. Phlebotomy having difficulty drawing labs. Midline is not recommended for labs due to unreliable blood return. Please consider PICC if pt will need prolonged venous access. Discussed with Graylin, RN.

## 2024-02-11 NOTE — Anesthesia Postprocedure Evaluation (Signed)
 Anesthesia Post Note  Patient: Dennis Wise  Procedure(s) Performed: ARTHROPLASTY, HIP, TOTAL,POSTERIOR APPROACH (Left: Hip)     Patient location during evaluation: PACU Anesthesia Type: General Level of consciousness: awake and alert Pain management: pain level controlled Vital Signs Assessment: post-procedure vital signs reviewed and stable Respiratory status: spontaneous breathing, nonlabored ventilation, respiratory function stable and patient connected to nasal cannula oxygen Cardiovascular status: blood pressure returned to baseline and stable Postop Assessment: no apparent nausea or vomiting Anesthetic complications: no   No notable events documented.  Last Vitals:  Vitals:   02/11/24 0658 02/11/24 1226  BP: (!) 156/51 (!) 122/44  Pulse: 66 61  Resp:  15  Temp:  37.4 C  SpO2:  95%    Last Pain:  Vitals:   02/11/24 1303  TempSrc:   PainSc: 6                  Sumayyah Custodio P Particia Strahm

## 2024-02-12 DIAGNOSIS — S72002A Fracture of unspecified part of neck of left femur, initial encounter for closed fracture: Secondary | ICD-10-CM | POA: Diagnosis not present

## 2024-02-12 LAB — GLUCOSE, CAPILLARY
Glucose-Capillary: 196 mg/dL — ABNORMAL HIGH (ref 70–99)
Glucose-Capillary: 243 mg/dL — ABNORMAL HIGH (ref 70–99)
Glucose-Capillary: 221 mg/dL — ABNORMAL HIGH (ref 70–99)
Glucose-Capillary: 275 mg/dL — ABNORMAL HIGH (ref 70–99)

## 2024-02-12 LAB — CBC WITH DIFFERENTIAL/PLATELET
Abs Immature Granulocytes: 0.03 K/uL (ref 0.00–0.07)
Basophils Absolute: 0 K/uL (ref 0.0–0.1)
Basophils Relative: 0 %
Eosinophils Absolute: 0.1 K/uL (ref 0.0–0.5)
Eosinophils Relative: 2 %
HCT: 24.7 % — ABNORMAL LOW (ref 39.0–52.0)
Hemoglobin: 7.4 g/dL — ABNORMAL LOW (ref 13.0–17.0)
Immature Granulocytes: 1 %
Lymphocytes Relative: 16 %
Lymphs Abs: 1 K/uL (ref 0.7–4.0)
MCH: 28.7 pg (ref 26.0–34.0)
MCHC: 30 g/dL (ref 30.0–36.0)
MCV: 95.7 fL (ref 80.0–100.0)
Monocytes Absolute: 0.6 K/uL (ref 0.1–1.0)
Monocytes Relative: 10 %
Neutro Abs: 4.6 K/uL (ref 1.7–7.7)
Neutrophils Relative %: 71 %
Platelets: 104 K/uL — ABNORMAL LOW (ref 150–400)
RBC: 2.58 MIL/uL — ABNORMAL LOW (ref 4.22–5.81)
RDW: 15.1 % (ref 11.5–15.5)
WBC: 6.4 K/uL (ref 4.0–10.5)
nRBC: 0 % (ref 0.0–0.2)

## 2024-02-12 LAB — BASIC METABOLIC PANEL WITH GFR
Anion gap: 9 (ref 5–15)
BUN: 52 mg/dL — ABNORMAL HIGH (ref 8–23)
CO2: 24 mmol/L (ref 22–32)
Calcium: 8.4 mg/dL — ABNORMAL LOW (ref 8.9–10.3)
Chloride: 104 mmol/L (ref 98–111)
Creatinine, Ser: 1.63 mg/dL — ABNORMAL HIGH (ref 0.61–1.24)
GFR, Estimated: 44 mL/min — ABNORMAL LOW (ref >60)
Glucose, Bld: 201 mg/dL — ABNORMAL HIGH (ref 70–99)
Potassium: 4.6 mmol/L (ref 3.5–5.1)
Sodium: 137 mmol/L (ref 135–145)

## 2024-02-12 LAB — CULTURE, BLOOD (ROUTINE X 2)
Culture: NO GROWTH
Culture: NO GROWTH
Special Requests: ADEQUATE

## 2024-02-12 NOTE — Plan of Care (Addendum)
 Pt alert and orientedx3, forgetful and reorientation needed, spouse at bedtime, encouraged pt to reposition in bed and refused during shift, pt was able to work with PT to get pt to dangle to edge of bed with with prn medication, adequate PO intake, denies nausea, urine output adequate,  No bm during shift. Safety precautions maintained.   Problem: Nutrition: Goal: Adequate nutrition will be maintained Outcome: Progressing   Problem: Safety: Goal: Ability to remain free from injury will improve Outcome: Progressing

## 2024-02-12 NOTE — Progress Notes (Signed)
 Physical Therapy Treatment Patient Details Name: Dennis Wise MRN: [id number] DOB: [birth year] Today's Date: 02/12/2024   History of Present Illness Patient is a 73 year old male who is s/p C2-T2 surgery and receiving home health PT services who presented to the ED with pain in left hip and elbow after experiencing a syncopal episode and falling at home.  Dx with comminuted posterior proximal intraarticular ulnar Fx, comminuted left proximal femur Fx, and right metacarpal II Fx.  Patient has undergone ORIF of left elbow 10/20 (NWB, splint, platform walker OK) and THA of left hip 10/22 (PWB, posterior precautions).  PMHx includes DM II with retinopathy, HTN, HLD, syncope, bilateral carotid stenosis, ischemic right PCA CVA, fatty liver disease, left knee Fx (ORIF, 2004), left tibia Fx (ORIF, 2012), anxiety, mild vascular neurocog    PT Comments  Pt seen for PT tx with wife present for session. Pt agreeable to participate but when asked to perform individual exercises requires encouragement to attempt. Pt with c/o L hip pain, LLE weakness, PT providing max education re: purpose of therapy & exercises. Pt performs LLE strengthening exercises with cuing for AAROM vs PROM. Pt does come to sitting EOB with total assist +2, tolerates sitting EOB ~3 minutes with up to mod assist for static sitting, cuing to correct posterior lean. Pt left in bed in chair position with tray table in front of him. Wife asked if pt was feeding himself with her reporting he wasn't. Encouraged pt to sit in bed in chair position to self feed; educated them on pt needing to perform as much as he can for himself. Continue to recommend post acute rehab <3 hours therapy/day upon d/c.    If plan is discharge home, recommend the following: Two people to help with bathing/dressing/bathroom;Two people to help with walking and/or transfers;Assist for transportation   Can travel by private vehicle     No  Equipment Recommendations  Other  (comment) (defer to next venue)    Recommendations for Other Services       Precautions / Restrictions Precautions Precautions: Fall;Cervical Required Braces or Orthoses: Cervical Brace Cervical Brace: Hard collar;At all times Restrictions Weight Bearing Restrictions Per Provider Order: Yes LUE Weight Bearing Per Provider Order: Weight bear through elbow only LLE Weight Bearing Per Provider Order: Partial weight bearing LLE Partial Weight Bearing Percentage or Pounds: 50% Other Position/Activity Restrictions: LUE precautions: NWB L elbow, Ok to use platform walker, Unrestricted ROM digits, wrist and shoulder  No elbow motion as he is splinted,  Splint to remain on x 2 weeks, Start passive extension in 2 weeks, Active extension against gravity in 6 weeks  Active extension against resistance in 8 weeks , Ice and elevate     Mobility  Bed Mobility Overal bed mobility: Needs Assistance Bed Mobility: Supine to Sit     Supine to sit: Total assist, +2 for safety/equipment, +2 for physical assistance, HOB elevated, Used rails (exit R side of bed) Sit to supine: Total assist, +2 for physical assistance, +2 for safety/equipment, HOB elevated, Used rails   General bed mobility comments: +2 to scoot to [facility]    Transfers                        Ambulation/Gait                   Comptroller  Bed    Modified Rankin (Stroke Patients Only)       Balance Overall balance assessment: Needs assistance Sitting-balance support: Feet supported Sitting balance-Leahy Scale: Poor Sitting balance - Comments: cuing to attempt to correct, shift weight anteriorly, pt holds to bed rail with RUE Postural control: Posterior lean                                  Communication Communication Communication: Impaired Factors Affecting Communication: Hearing impaired  Cognition Arousal: Alert Behavior During Therapy:  Anxious   PT - Cognitive impairments: Attention, Initiation, Problem solving, Safety/Judgement, Awareness                       PT - Cognition Comments: requires max education/encouragement re: benefits of participating in therapy, says he will try but then states too much pain, then states he will try again Following commands: Impaired Following commands impaired: Follows one step commands with increased time, Follows one step commands inconsistently    Cueing Cueing Techniques: Verbal cues, Tactile cues  Exercises General Exercises - Lower Extremity Short Arc Quad: AAROM, Supine, Strengthening, Left, 10 reps Long Arc Quad: AROM, Seated, Strengthening, Left, 10 reps Heel Slides: AAROM, Supine, Strengthening, Left, 10 reps Hip ABduction/ADduction: Supine, AAROM, Strengthening, Left, 10 reps (hip abduction slides x 10, hip adduction pillow squeezes x 10) Other Exercises Other Exercises: Pt attempted pulling trunk anteriorly while sitting semi fowler in bed, max assist to attempt x 5 reps; poor RUE strength & abdominal strength noted & pt endorses this as well.    General Comments        Pertinent Vitals/Pain Pain Assessment Pain Assessment: Faces Faces Pain Scale: Hurts worst Pain Location: L hip with mobility Pain Descriptors / Indicators: Discomfort, Grimacing, Guarding (yelling) Pain Intervention(s): Monitored during session, Limited activity within patient's tolerance, RN gave pain meds during session, Repositioned, Utilized relaxation techniques    Home Living                          Prior Function            PT Goals (current goals can now be found in the care plan section) Acute Rehab PT Goals Patient Stated Goal: agreeable to rehab PT Goal Formulation: With patient/family Time For Goal Achievement: 02/25/24 Potential to Achieve Goals: Fair Progress towards PT goals: Progressing toward goals    Frequency    Min 2X/week      PT Plan       Co-evaluation              AM-PAC PT 6 Clicks Mobility   Outcome Measure  Help needed turning from your back to your side while in a flat bed without using bedrails?: Total Help needed moving from lying on your back to sitting on the side of a flat bed without using bedrails?: Total Help needed moving to and from a bed to a chair (including a wheelchair)?: Total Help needed standing up from a chair using your arms (e.g., wheelchair or bedside chair)?: Total Help needed to walk in hospital room?: Total Help needed climbing 3-5 steps with a railing? : Total 6 Click Score: 6    End of Session Equipment Utilized During Treatment: Cervical collar Activity Tolerance: Patient limited by fatigue;Patient limited by pain Patient left: in bed;with call bell/phone within reach;with bed alarm set (in chair position)  PT Visit Diagnosis: Other abnormalities of gait and mobility (R26.89);History of falling (Z91.81);Muscle weakness (generalized) (M62.81);Pain;Difficulty in walking, not elsewhere classified (R26.2) Pain - Right/Left: Left Pain - part of body: Hip     Time: 8686-8650 PT Time Calculation (min) (ACUTE ONLY): 36 min  Charges:    $Therapeutic Exercise: 8-22 mins $Therapeutic Activity: 8-22 mins PT General Charges $$ ACUTE PT VISIT: 1 Visit                     Richerd Pinal, PT, DPT 02/12/24, 1:58 PM    Richerd CHRISTELLA Pinal 02/12/2024, 1:58 PM

## 2024-02-12 NOTE — Progress Notes (Signed)
 Progress Note   Patient: Dennis Wise FMW:[id number] DOB: November 27, 1950 DOA: 02/07/2024     5 DOS: the patient was seen and examined on 02/12/2024   Brief hospital course: 73yo with h/o MGUS, CVA, HTN, and DW who presented on 10/19 with a fall resulting in elbow and hip fractures (possibly pathologic fractures in the setting of MGUS). He underwent ORIF of elbow fracture on 10/20 and total hip arthroplasty on 10/22.    Assessment and Plan:  Fall with multiple fractures Syncopal episode resulting in multiple fractures Possible pathologic fracture(s) associated with MGUS L forehead lac repaired with tissue adhesive No acute injury to C spine - s/p decompression laminectomies C3-C6 and bilateral posterolateral spinal fusion from C2 through T2 Low 25-OH vitamin D, needs supplement Follow outpatient with PCP to consider bisphosphonate Multimodal pain control with tylenol  TID, robaxin TID, gabapentin TID, and oxycodone 5mg  q4h prn   L hip fracture CT L hip with comminuted fracture of the L femoral neck and intertrochanteric region with mild varus angulation  Underwent L THA on 10/22 PT/OT consulting Appears likely to need SNF rehab given circumstances   L elbow fracture, R Distal Second Metacarpal Fracture Plain films of L elbow with osteopenia with endosteal scalloping in the distal humerus and visualized portions of the radius and ulna (concerning for pathologic process) Intraarticular fracture of proximal ulna, angulated appearance of radial neck (fx not excluded) Age indeterminate angulated fx of distal 2nd metacarpal Underwent ORIF of elbow fracture on 10/20   AKI on CKD IIIa Baseline creatinine 1.3 Attempt to avoid nephrotoxic medications Slightly worse today, will continue to follow   Syncope Reported to be positional after addition of new BP medication a few weeks ago PT/OT consulted; apprec eval/recs Monitor on telemetry for now EEG without seizure or epileptiform discharges  (right temporooccipital cortical dysfunction likely secondary to underlying structural abnromality/stroke) Echo unremarkable Consider cardiac event monitor   HTN Hold hydrochlorothiazide , losartan  BPs are reasonably controlled   Post Op Anemia Transfused 2 units PRBC per orthopedics Downtrending, will continue to follow   Hx MGUS Needs heme onc follow up outpatient    Recent C2-T2 Fusion Ortho reached out to [place] for Dr. Dow Per 10/10 note, continue hard collar another 3 weeks until follow up with Dr. Dow    Hx Stroke Old R temporooccipital infarct with encephalomalacia and ex vacuo dilation of R lateral ventricle Plavix  on hold  Continue statin   T2DM Glimeperide and actos  and metformin  on hold A1c 6.3, good control Cover with moderate-scale SSI   Dyslipidemia Niacin  on hold Continue atorvastatin     Mood disorder Continue Lexapro          Consultants: Orthopedics   Procedures: ORIF elbow 10/20 THA 10/22   Antibiotics: Cefazolin x 4 doses  30 Day Unplanned Readmission Risk Score    Flowsheet Row ED to Hosp-Admission (Current) from 02/07/2024 in [facility]  30 Day Unplanned Readmission Risk Score (%) 17.61 Filed at 02/12/2024 0801    This score is the patient's risk of an unplanned readmission within 30 days of being discharged (0 -100%). The score is based on dignosis, age, lab data, medications, orders, and past utilization.   Low:  0-14.9   Medium: 15-21.9   High: 22-29.9   Extreme: 30 and above           Subjective: Hurting diffusely but overall improving.  Able to sit on the side of the bed yesterday but not able to bear  weight with standing.   Objective: Vitals:   02/12/24 0433 02/12/24 1419  BP: (!) 143/55 (!) 129/57  Pulse: (!) 56 (!) 53  Resp: 17 18  Temp: 98.4 F (36.9 C) 98.2 F (36.8 C)  SpO2: 93% 94%    Intake/Output Summary (Last 24 hours) at 02/12/2024 1630 Last data filed at  02/12/2024 1030 Gross per 24 hour  Intake 1118.1 ml  Output 1500 ml  Net -381.9 ml   Filed Weights   02/07/24 0622 02/08/24 1047  Weight: 105.7 kg 105.7 kg    Exam:  General:  Appears calm and comfortable and is in NAD; hard C-collar in place Eyes:  normal lids, iris; lac above L eyebrow is improving ENT:  hard of hearing, grossly normal lips & tongue, mmm Cardiovascular:  RRR. No LE edema.  Respiratory:   CTA bilaterally with no wheezes/rales/rhonchi.  Normal respiratory effort. Abdomen:  soft, NT, ND Skin:  scattered traumatic injuries, looks better today Musculoskeletal:  LUE splinted, L hip with surgical bandage C/D/I; able to wiggle fingers and toes; able to sit up with PT help (eventually) Psychiatric:  blunted mood and affect, speech fluent and appropriate, AOx3 Neurologic:  CN 2-12 grossly intact, moves all extremities in coordinated fashion  Data Reviewed: I have reviewed the patient's lab results since admission.  Pertinent labs for today include:   Glucose 201 BUN 52/Creatinine 1.63/GFR 44 WBC 6.4 Hgb 7.4, down from 8.2 Platelets 104     Family Communication: Wife was present  Mobility: PT/OT Consulted and are recommending - Skilled Nursing-Short Term Rehab (<3 Hours/Day)02/12/2024 1354    Code Status: Full Code   Disposition: Status is: Inpatient Remains inpatient appropriate because: needs placement     Time spent: 50 minutes  Unresulted Labs (From admission, onward)     Start     Ordered   02/13/24 0500  CBC with Differential/Platelet  Tomorrow morning,   R       Question:  Specimen collection method  Answer:  Lab=Lab collect   02/12/24 1630   02/13/24 0500  Basic metabolic panel with GFR  Tomorrow morning,   R       Question:  Specimen collection method  Answer:  Lab=Lab collect   02/12/24 1630             Author: Delon Herald, MD 02/12/2024 4:30 PM  For on call review [email or web address].

## 2024-02-12 NOTE — TOC Progression Note (Signed)
 Transition of Care [facility]) - Progression Note    Patient Details  Name: Dennis Wise MRN: [id number] Date of Birth: 03-15-1951  Transition of Care [facility]) CM/SW Contact  Sonda Manuella Quill, RN Phone Number: 02/12/2024, 3:12 PM  Clinical Narrative:    IP CM consult for SNF; pt asleep in room; spoke w/ wife Dennis Wise at bedside; she agreed to recc; explained SNF process; she verbalized understanding and said she would like search limited to [place]; [employer] said pt wears Mlasses; he does not have dentures or HS; she said pt is continent of bowel/bladder, and he can feed himself; to her knowledge pt does not have skin issues; received PASRR # [id number]; FL2 sent for co-sign; faxed out for bed; awaiting offers; ins auth.     Barriers to Discharge: Continued Medical Work up               Expected Discharge Plan and Services In-house Referral: NA Discharge Planning Services: NA   Living arrangements for the past 2 months: Skilled Nursing Facility                 DME Arranged: N/A DME Agency: NA       HH Arranged: NA HH Agency: NA         Social Drivers of Health (SDOH) Interventions SDOH Screenings   Food Insecurity: No Food Insecurity (02/07/2024)  Housing: Low Risk  (02/07/2024)  Transportation Needs: No Transportation Needs (02/07/2024)  Utilities: Not At Risk (02/07/2024)  Alcohol Screen: Low Risk  (05/05/2023)  Depression (PHQ2-9): Low Risk  (12/29/2023)  Financial Resource Strain: Low Risk  (02/03/2024)  Physical Activity: Inactive (02/03/2024)  Social Connections: Socially Integrated (02/07/2024)  Stress: No Stress Concern Present (02/03/2024)  Tobacco Use: Low Risk  (02/10/2024)  Health Literacy: Adequate Health Literacy (05/05/2023)    Readmission Risk Interventions     No data to display

## 2024-02-12 NOTE — Progress Notes (Signed)
     Subjective:  Patient reports pain as moderate.  He did work with PT and was able to stand. No issues overnight. Encouraged continued mobility with PT which will be difficult given multiple extremity injuries.  Yesterday's total administered Morphine Milligram Equivalents: 47.5   Objective:   VITALS:   Vitals:   02/11/24 0658 02/11/24 1226 02/11/24 2010 02/12/24 0433  BP: (!) 156/51 (!) 122/44 (!) 122/44 (!) 143/55  Pulse: 66 61 (!) 55 (!) 56  Resp:  15 17 17   Temp:  99.4 F (37.4 C) 98.9 F (37.2 C) 98.4 F (36.9 C)  TempSrc:      SpO2:  95% 96% 93%  Weight:      Height:        Sensation intact distally Intact pulses distally Dorsiflexion/Plantar flexion intact Incision: dressing C/D/I Compartment soft    Lab Results  Component Value Date   WBC 6.5 02/11/2024   HGB 8.2 (L) 02/11/2024   HCT 25.7 (L) 02/11/2024   MCV 89.5 02/11/2024   PLT 98 (L) 02/11/2024   BMET    Component Value Date/Time   NA 136 02/11/2024 0749   NA 138 01/28/2016 0804   K 4.6 02/11/2024 0749   K 4.7 01/28/2016 0804   CL 103 02/11/2024 0749   CL 103 08/30/2012 0901   CO2 25 02/11/2024 0749   CO2 24 01/28/2016 0804   GLUCOSE 231 (H) 02/11/2024 0749   GLUCOSE 263 (H) 01/28/2016 0804   GLUCOSE 155 (H) 08/30/2012 0901   BUN 42 (H) 02/11/2024 0749   BUN 23.4 01/28/2016 0804   CREATININE 1.39 (H) 02/11/2024 0749   CREATININE 1.71 (H) 02/04/2024 0911   CREATININE 1.2 01/28/2016 0804   CALCIUM  8.5 (L) 02/11/2024 0749   CALCIUM  9.1 01/28/2016 0804   EGFR 61 (L) 01/28/2016 0804   GFRNONAA 53 (L) 02/11/2024 0749   GFRNONAA 41 (L) 02/04/2024 0911      Xray: THA components in good position no adverse features  Assessment/Plan: 2 Days Post-Op   Principal Problem:   Closed left hip fracture (HCC)  S/p L THA for femoral neck fracture 10/22  Post op recs: WB: 50% PWB LLE, 6 weeks posterior hip precautions x6 weeks Abx: ancef Imaging: PACU pelvis Xray Dressing: Aquacell, keep  intact until follow up DVT prophylaxis: Aspirin  81BID starting POD1, resume plavix  POD2 Follow up: 2 weeks after surgery for a wound check with Dr. Edna at [facility].  Address: [address], [place], [place] [address]  Office Phone: [phone]   TORIBIO DELENA EDNA 02/12/2024, 5:57 AM   TORIBIO Edna, MD  Contact information:   [phone] 7am-5pm epic message Dr. Edna, or call office for patient follow up: [phone] After hours and holidays please check [email or web address] for group call information for [facility]

## 2024-02-12 NOTE — NC FL2 (Signed)
 [place]  MEDICAID FL2 LEVEL OF CARE FORM     IDENTIFICATION  Patient Name: Dennis Wise Birthdate: March 13, 1951 Sex: male Admission Date (Current Location): 02/07/2024  [facility] and [place] Number:  Producer, television/film/video and Address:  [facility],  [address], [place] [address]      Provider Number: [id number]  Attending Physician Name and Address:  Barbarann Nest, MD  Relative Name and Phone Number:  Lesslie Mckeehan (spouse) [phone]    Current Level of Care: Hospital Recommended Level of Care: Skilled Nursing Facility Prior Approval Number:    Date Approved/Denied:   PASRR Number: [id number]  Discharge Plan: SNF    Current Diagnoses: Patient Active Problem List   Diagnosis Date Noted   Closed left hip fracture (HCC) 02/07/2024   Post-operative pain 01/21/2024   Diabetic skin ulcer associated with type 2 diabetes mellitus (HCC) 10/06/2023   Other social stressor 05/31/2023   DM type 2 with diabetic background retinopathy (HCC) 05/31/2023   Mild vascular neurocognitive disorder 05/07/2023   Generalized anxiety disorder 10/01/2022   Pain in joint of left knee 09/27/2022   Hemorrhagic disorder due to circulating anticoagulants 09/27/2022   Ischemic right PCA stroke 09/05/2022   Left homonymous hemianopsia 08/2022   Shoulder pain 07/02/2022   Monoclonal B-cell lymphocytosis 01/15/2022   Weight loss 01/15/2022   Syncope 11/22/2021   Bilateral carotid artery stenosis 09/23/2021   Nocturia 08/18/2021   IDA (iron  deficiency anemia) 07/24/2021   Thrombocytopenia 05/03/2021   Normocytic anemia 04/03/2021   Unstable ankle 09/12/2020   Back pain 09/12/2020   Dupuytren contracture 03/03/2019   Pseudophakia, both eyes 07/17/2016   Combined form of senile cataract of both eyes 05/02/2016   Hearing loss 10/20/2014   Obesity (BMI 30-39.9) 10/16/2013   Advance care planning 10/16/2013   Actinic keratosis 10/16/2013   History of colonic polyps  06/05/2012   Pancytopenia, acquired 05/01/2011   Tibial fracture 10/24/2010   Diabetic retinopathy 03/21/2005   HTN (hypertension), malignant 11/20/1999   GERD (gastroesophageal reflux disease) 01/19/1989   Pure hypercholesterolemia 04/21/1988    Orientation RESPIRATION BLADDER Height & Weight     Self, Time, Situation, Place  Normal External catheter Weight: 105.7 kg Height:  6' 2 (188 cm)  BEHAVIORAL SYMPTOMS/MOOD NEUROLOGICAL BOWEL NUTRITION STATUS      Continent Diet (carbohydrate modified; 1600-2000 calories)  AMBULATORY STATUS COMMUNICATION OF NEEDS Skin   Extensive Assist Verbally Other (Comment), Bruising, Surgical wounds (erythema and ecchymosis left eye; ecchymosis bilateral arms, hips, and legs; surgical incision LUA surgical incision LLE)                       Personal Care Assistance Level of Assistance  Bathing, Feeding, Dressing Bathing Assistance: Maximum assistance Feeding assistance: Limited assistance Dressing Assistance: Maximum assistance     Functional Limitations Info  Sight, Hearing, Speech Sight Info: Impaired (glasses) Hearing Info: Impaired Speech Info: Adequate    SPECIAL CARE FACTORS FREQUENCY  PT (By licensed PT), OT (By licensed OT)     PT Frequency: 5x/week OT Frequency: 5x/week            Contractures Contractures Info: Not present    Additional Factors Info  Code Status, Allergies, Insulin  Sliding Scale Code Status Info: Full Code Allergies Info: Hydralazine , Promethazine Hcl   Insulin  Sliding Scale Info: See MAR       Current Medications (02/12/2024):  This is the current hospital active medication list Current Facility-Administered Medications  Medication Dose Route  Frequency Provider Last Rate Last Admin   0.9 %  sodium chloride  infusion   Intravenous Continuous Cockerham, Alicia M, PA-C 100 mL/hr at 02/12/24 1017 New Bag at 02/12/24 1017   acetaminophen  (TYLENOL ) tablet 1,000 mg  1,000 mg Oral TID Cockerham, Alicia  M, PA-C   1,000 mg at 02/12/24 1012   aspirin  EC tablet 81 mg  81 mg Oral BID Cockerham, Alicia M, PA-C   81 mg at 02/12/24 1013   atorvastatin  (LIPITOR) tablet 10 mg  10 mg Oral QHS Cockerham, Alicia M, PA-C   10 mg at 02/12/24 0024   cholecalciferol (VITAMIN D3) 25 MCG (1000 UNIT) tablet 1,000 Units  1,000 Units Oral Daily Cockerham, Alicia M, PA-C   1,000 Units at 02/12/24 1013   clopidogrel  (PLAVIX ) tablet 75 mg  75 mg Oral Daily Cockerham, Alicia M, PA-C   75 mg at 02/12/24 1012   escitalopram  (LEXAPRO ) tablet 10 mg  10 mg Oral Daily Cockerham, Alicia M, PA-C   10 mg at 02/12/24 1013   feeding supplement (ENSURE PLUS HIGH PROTEIN) liquid 237 mL  237 mL Oral BID BM Cockerham, Alicia M, PA-C   237 mL at 02/11/24 1659   gabapentin (NEURONTIN) capsule 300 mg  300 mg Oral TID Cockerham, Alicia M, PA-C   300 mg at 02/12/24 1013   HYDROmorphone (DILAUDID) injection 0.5-1 mg  0.5-1 mg Intravenous Q4H PRN Cockerham, Alicia M, PA-C   1 mg at 02/12/24 1336   insulin  aspart (novoLOG ) injection 0-15 Units  0-15 Units Subcutaneous TID WC Cockerham, Alicia M, PA-C   5 Units at 02/12/24 1300   insulin  aspart (novoLOG ) injection 2 Units  2 Units Subcutaneous TID WC Cockerham, Alicia M, PA-C   2 Units at 02/12/24 1300   memantine  (NAMENDA ) tablet 5 mg  5 mg Oral BID Cockerham, Alicia M, PA-C   5 mg at 02/12/24 1013   methocarbamol (ROBAXIN) tablet 500 mg  500 mg Oral TID Cockerham, Alicia M, PA-C   500 mg at 02/12/24 1013   ondansetron  (ZOFRAN ) injection 4 mg  4 mg Intravenous Q6H Cockerham, Alicia M, PA-C   4 mg at 02/11/24 1303   ondansetron  (ZOFRAN -ODT) disintegrating tablet 4 mg  4 mg Oral Q8H PRN Cockerham, Alicia M, PA-C       oxyCODONE (Oxy IR/ROXICODONE) immediate release tablet 5 mg  5 mg Oral Q4H PRN Cockerham, Alicia M, PA-C   5 mg at 02/11/24 1303   polyethylene glycol (MIRALAX / GLYCOLAX) packet 17 g  17 g Oral BID Cockerham, Alicia M, PA-C   17 g at 02/12/24 1013   psyllium (HYDROCIL/METAMUCIL) 1  packet  1 packet Oral QHS Cockerham, Alicia M, PA-C   1 packet at 02/11/24 2240     Discharge Medications: Please see discharge summary for a list of discharge medications.  Relevant Imaging Results:  Relevant Lab Results:   Additional Information SSN [id number]  Sonda Manuella Quill, RN

## 2024-02-12 NOTE — Plan of Care (Signed)
  Problem: Education: Goal: Ability to describe self-care measures that may prevent or decrease complications (Diabetes Survival Skills Education) will improve Outcome: Progressing Goal: Individualized Educational Video(s) Outcome: Progressing   Problem: Coping: Goal: Ability to adjust to condition or change in health will improve Outcome: Progressing   Problem: Fluid Volume: Goal: Ability to maintain a balanced intake and output will improve Outcome: Progressing   Problem: Health Behavior/Discharge Planning: Goal: Ability to manage health-related needs will improve Outcome: Progressing   Problem: Metabolic: Goal: Ability to maintain appropriate glucose levels will improve Outcome: Progressing   Problem: Education: Goal: Knowledge of General Education information will improve Description: Including pain rating scale, medication(s)/side effects and non-pharmacologic comfort measures Outcome: Progressing

## 2024-02-12 NOTE — Inpatient Diabetes Management (Signed)
 Inpatient Diabetes Program Recommendations  AACE/ADA: New Consensus Statement on Inpatient Glycemic Control (2015)  Target Ranges:  Prepandial:   less than 140 mg/dL      Peak postprandial:   less than 180 mg/dL (1-2 hours)      Critically ill patients:  140 - 180 mg/dL   Lab Results  Component Value Date   GLUCAP 196 (H) 02/12/2024   HGBA1C 6.3 (A) 02/04/2024   Review of Glycemic Control    Latest Reference Range & Units 02/11/24 07:34 02/11/24 12:26 02/11/24 17:17 02/11/24 21:06 02/12/24 07:55  Glucose-Capillary 70 - 99 mg/dL 774 (H)  Novolog  7 units 261 (H)  Novolog  10 units 232 (H)  Novolog  7 units 175 (H) 196 (H)   Diabetes history: DM2   Outpatient Diabetes medications: Amaryl  2mg  daily  Metformin  850mg  BID  Actos  45mg  daily    Current orders for Inpatient glycemic control:  Novolog  0-15 units TID  Novolog  2 units TID with meals   Ensure Plus high protein (19 grams of carbohydrates) bid between meals  Inpatient Diabetes Program Recommendations:    -   increasing Novolog  meal coverage to 3-4 units. Pt getting at least 7 units per meal but trends come back down overnight into upper 100 range   Needs tight glycemic control for healing.  Thanks, Clotilda Bull RN, MSN, BC-ADM Inpatient Diabetes Coordinator Team Pager [phone] (8a-5p)

## 2024-02-13 DIAGNOSIS — S72002A Fracture of unspecified part of neck of left femur, initial encounter for closed fracture: Secondary | ICD-10-CM | POA: Diagnosis not present

## 2024-02-13 LAB — TYPE AND SCREEN
ABO/RH(D): B POS
Antibody Screen: NEGATIVE
Unit division: 0
Unit division: 0

## 2024-02-13 LAB — BASIC METABOLIC PANEL WITH GFR
Anion gap: 9 (ref 5–15)
BUN: 54 mg/dL — ABNORMAL HIGH (ref 8–23)
CO2: 23 mmol/L (ref 22–32)
Calcium: 8.6 mg/dL — ABNORMAL LOW (ref 8.9–10.3)
Chloride: 103 mmol/L (ref 98–111)
Creatinine, Ser: 1.48 mg/dL — ABNORMAL HIGH (ref 0.61–1.24)
GFR, Estimated: 49 mL/min — ABNORMAL LOW (ref >60)
Glucose, Bld: 252 mg/dL — ABNORMAL HIGH (ref 70–99)
Potassium: 4.7 mmol/L (ref 3.5–5.1)
Sodium: 135 mmol/L (ref 135–145)

## 2024-02-13 LAB — CBC WITH DIFFERENTIAL/PLATELET
Abs Immature Granulocytes: 0.02 K/uL (ref 0.00–0.07)
Basophils Absolute: 0 K/uL (ref 0.0–0.1)
Basophils Relative: 0 %
Eosinophils Absolute: 0.2 K/uL (ref 0.0–0.5)
Eosinophils Relative: 4 %
HCT: 22.8 % — ABNORMAL LOW (ref 39.0–52.0)
Hemoglobin: 7.4 g/dL — ABNORMAL LOW (ref 13.0–17.0)
Immature Granulocytes: 0 %
Lymphocytes Relative: 15 %
Lymphs Abs: 0.9 K/uL (ref 0.7–4.0)
MCH: 29.4 pg (ref 26.0–34.0)
MCHC: 32.5 g/dL (ref 30.0–36.0)
MCV: 90.5 fL (ref 80.0–100.0)
Monocytes Absolute: 0.5 K/uL (ref 0.1–1.0)
Monocytes Relative: 9 %
Neutro Abs: 4.1 K/uL (ref 1.7–7.7)
Neutrophils Relative %: 72 %
Platelets: 108 K/uL — ABNORMAL LOW (ref 150–400)
RBC: 2.52 MIL/uL — ABNORMAL LOW (ref 4.22–5.81)
RDW: 14.8 % (ref 11.5–15.5)
WBC: 5.7 K/uL (ref 4.0–10.5)
nRBC: 0 % (ref 0.0–0.2)

## 2024-02-13 LAB — GLUCOSE, CAPILLARY
Glucose-Capillary: 220 mg/dL — ABNORMAL HIGH (ref 70–99)
Glucose-Capillary: 207 mg/dL — ABNORMAL HIGH (ref 70–99)
Glucose-Capillary: 301 mg/dL — ABNORMAL HIGH (ref 70–99)
Glucose-Capillary: 206 mg/dL — ABNORMAL HIGH (ref 70–99)

## 2024-02-13 LAB — BPAM RBC
Blood Product Expiration Date: 202510302359
Blood Product Expiration Date: 202511042359
ISSUE DATE / TIME: 202510131239
ISSUE DATE / TIME: 202510211821
Unit Type and Rh: 7300
Unit Type and Rh: 7300

## 2024-02-13 MED ORDER — INSULIN GLARGINE-YFGN 100 UNIT/ML ~~LOC~~ SOLN
5.0000 [IU] | Freq: Every day | SUBCUTANEOUS | Status: DC
  Administered 2024-02-13 – 2024-02-14 (×2): 5 [IU] via SUBCUTANEOUS
  Filled 2024-02-13 (×2): qty 0.05

## 2024-02-13 NOTE — Plan of Care (Signed)
  Problem: Coping: Goal: Ability to adjust to condition or change in health will improve Outcome: Progressing   Problem: Nutritional: Goal: Maintenance of adequate nutrition will improve Outcome: Progressing   Problem: Activity: Goal: Risk for activity intolerance will decrease Outcome: Progressing   Problem: Nutrition: Goal: Adequate nutrition will be maintained Outcome: Progressing   Problem: Coping: Goal: Level of anxiety will decrease Outcome: Progressing   Problem: Pain Managment: Goal: General experience of comfort will improve and/or be controlled Outcome: Progressing   Problem: Safety: Goal: Ability to remain free from injury will improve Outcome: Progressing

## 2024-02-13 NOTE — Progress Notes (Addendum)
 Progress Note   Patient: Dennis Wise FMW:[id number] DOB: 08/07/1950 DOA: 02/07/2024     6 DOS: the patient was seen and examined on 02/13/2024   Brief hospital course: 73yo with h/o MGUS, CVA, HTN, and DW who presented on 10/19 with a fall resulting in elbow and hip fractures (possibly pathologic fractures in the setting of MGUS). He underwent ORIF of elbow fracture on 10/20 and total hip arthroplasty on 10/22.  Medically stable for STR when SNF bed is available and insurance authorization is obtained.  Assessment and Plan:  Fall with multiple fractures Syncopal episode resulting in multiple fractures Possible pathologic fracture(s) associated with MGUS L forehead lac repaired with tissue adhesive No acute injury to C spine - s/p decompression laminectomies C3-C6 and bilateral posterolateral spinal fusion from C2 through T2 Low 25-OH vitamin D, needs supplement Follow outpatient with PCP to consider bisphosphonate Multimodal pain control with tylenol  TID, robaxin TID, gabapentin TID, and oxycodone 5mg  q4h prn   L hip fracture CT L hip with comminuted fracture of the L femoral neck and intertrochanteric region with mild varus angulation  Underwent L THA on 10/22 PT/OT consulting Recommended for SNF rehab given circumstances He is medically stable for transfer to SNF when a bed is available   L elbow fracture, R Distal Second Metacarpal Fracture Plain films of L elbow with osteopenia with endosteal scalloping in the distal humerus and visualized portions of the radius and ulna (concerning for pathologic process) Intraarticular fracture of proximal ulna, angulated appearance of radial neck (fx not excluded) Age indeterminate angulated fx of distal 2nd metacarpal Underwent ORIF of elbow fracture on 10/20   AKI on CKD IIIa Baseline creatinine 1.3 Attempt to avoid nephrotoxic medications Generally stable   Syncope Reported to be positional after addition of new BP medication a few  weeks ago PT/OT consulted EEG without seizure or epileptiform discharges (right temporooccipital cortical dysfunction likely secondary to underlying structural abnromality/stroke) Echo unremarkable Consider cardiac event monitor as an outpatient   HTN Hold hydrochlorothiazide , losartan  BP currently 116/44   Post Op Anemia Transfused 2 units PRBC per orthopedics Hgb 7.4, appears to be stable currently   Hx MGUS Needs heme onc follow up outpatient    Recent C2-T2 Fusion Ortho reached out to [place] for Dr. Dow Per 10/10 note, continue hard collar another 3 weeks until follow up with Dr. Dow    Hx Stroke Old R temporooccipital infarct with encephalomalacia and ex vacuo dilation of R lateral ventricle Plavix  resumed  Continue statin Probable vascular dementia, having some delirium in the hospital   T2DM Glimeperide and actos  and metformin  on hold A1c 6.3, good control at baseline Has had hyperglycemia here so working on improved control for wound healing with DM coordinator assistance Will start glargine 5 units daily Cover with moderate-scale SSI   Dyslipidemia Niacin  on hold Continue atorvastatin     Mood disorder Continue Lexapro   DNR I have discussed code status with the patient/family and they are in agreement that the patient would not desire resuscitation and would prefer to die a natural death should that situation arise. Patient will need a gold out of facility DNR form at the time of discharge         Consultants: Orthopedics Diabetes coordinator PT OT [facility] team   Procedures: ORIF elbow 10/20 THA 10/22   Antibiotics: Cefazolin x 4 doses     30 Day Unplanned Readmission Risk Score    Flowsheet Row ED to Hosp-Admission (Current) from 02/07/2024 in [place]  [facility]  30 Day Unplanned Readmission Risk Score (%) 16.33 Filed at 02/13/2024 0800    This score is the patient's risk of an unplanned readmission within  30 days of being discharged (0 -100%). The score is based on dignosis, age, lab data, medications, orders, and past utilization.   Low:  0-14.9   Medium: 15-21.9   High: 22-29.9   Extreme: 30 and above           Subjective: Continues to feel better.  Still unable to bear weight and stand.   Objective: Vitals:   02/13/24 0631 02/13/24 1233  BP: (!) 149/61 (!) 116/44  Pulse: (!) 56 (!) 55  Resp:  17  Temp:  99 F (37.2 C)  SpO2:  95%    Intake/Output Summary (Last 24 hours) at 02/13/2024 1539 Last data filed at 02/13/2024 1500 Gross per 24 hour  Intake 4635.63 ml  Output 2400 ml  Net 2235.63 ml   Filed Weights   02/07/24 0622 02/08/24 1047  Weight: 105.7 kg 105.7 kg    Exam:  General:  Appears calm and comfortable and is in NAD; soft C-collar in place today Eyes:  normal lids, iris; lac above L eyebrow is improving ENT:  hard of hearing, grossly normal lips & tongue, mmm Cardiovascular:  RRR. No LE edema.  Respiratory:   CTA bilaterally with no wheezes/rales/rhonchi.  Normal respiratory effort. Abdomen:  soft, NT, ND Skin:  scattered traumatic injuries, looks better today Musculoskeletal:  LUE splinted, L hip with surgical bandage C/D/I Psychiatric:  blunted mood and affect, speech fluent and appropriate, AOx3 Neurologic:  CN 2-12 grossly intact, moves all extremities in coordinated fashion  Data Reviewed: I have reviewed the patient's lab results since admission.  Pertinent labs for today include:   Glucose 252 BUN 54/Creatinine 1.48/GFR 49, stable WBC 5.7 Hgb 7.4, stable Platelets 108, stable    Family Communication: Wife present  Mobility: PT/OT Consulted and are recommending - Skilled Nursing-Short Term Rehab (<3 Hours/Day)02/12/2024 1354    Code Status: Limited: Do not attempt resuscitation (DNR) -DNR-LIMITED -Do Not Intubate/DNI     Disposition: Status is: Inpatient Remains inpatient appropriate because: awaiting placement     Time spent:  35 minutes  Unresulted Labs (From admission, onward)     Start     Ordered   02/14/24 0500  CBC  Tomorrow morning,   R       Question:  Specimen collection method  Answer:  Lab=Lab collect   02/13/24 1539   02/14/24 0500  Basic metabolic panel with GFR  Tomorrow morning,   R       Question:  Specimen collection method  Answer:  Lab=Lab collect   02/13/24 1539             Author: Delon Herald, MD 02/13/2024 3:39 PM  For on call review [email or web address].

## 2024-02-14 DIAGNOSIS — S72002A Fracture of unspecified part of neck of left femur, initial encounter for closed fracture: Secondary | ICD-10-CM | POA: Diagnosis not present

## 2024-02-14 LAB — GLUCOSE, CAPILLARY
Glucose-Capillary: 197 mg/dL — ABNORMAL HIGH (ref 70–99)
Glucose-Capillary: 198 mg/dL — ABNORMAL HIGH (ref 70–99)
Glucose-Capillary: 234 mg/dL — ABNORMAL HIGH (ref 70–99)
Glucose-Capillary: 244 mg/dL — ABNORMAL HIGH (ref 70–99)

## 2024-02-14 LAB — CBC
HCT: 24.7 % — ABNORMAL LOW (ref 39.0–52.0)
Hemoglobin: 7.5 g/dL — ABNORMAL LOW (ref 13.0–17.0)
MCH: 28.3 pg (ref 26.0–34.0)
MCHC: 30.4 g/dL (ref 30.0–36.0)
MCV: 93.2 fL (ref 80.0–100.0)
Platelets: 115 K/uL — ABNORMAL LOW (ref 150–400)
RBC: 2.65 MIL/uL — ABNORMAL LOW (ref 4.22–5.81)
RDW: 14.7 % (ref 11.5–15.5)
WBC: 4.7 K/uL (ref 4.0–10.5)
nRBC: 0 % (ref 0.0–0.2)

## 2024-02-14 LAB — BASIC METABOLIC PANEL WITH GFR
Anion gap: 9 (ref 5–15)
BUN: 54 mg/dL — ABNORMAL HIGH (ref 8–23)
CO2: 22 mmol/L (ref 22–32)
Calcium: 8.8 mg/dL — ABNORMAL LOW (ref 8.9–10.3)
Chloride: 104 mmol/L (ref 98–111)
Creatinine, Ser: 1.5 mg/dL — ABNORMAL HIGH (ref 0.61–1.24)
GFR, Estimated: 48 mL/min — ABNORMAL LOW (ref >60)
Glucose, Bld: 211 mg/dL — ABNORMAL HIGH (ref 70–99)
Potassium: 4.9 mmol/L (ref 3.5–5.1)
Sodium: 136 mmol/L (ref 135–145)

## 2024-02-14 MED ORDER — INSULIN GLARGINE-YFGN 100 UNIT/ML ~~LOC~~ SOLN
10.0000 [IU] | Freq: Every day | SUBCUTANEOUS | Status: DC
  Administered 2024-02-15 – 2024-02-17 (×3): 10 [IU] via SUBCUTANEOUS
  Filled 2024-02-14 (×4): qty 0.1

## 2024-02-14 NOTE — Plan of Care (Signed)
  Problem: Education: Goal: Ability to describe self-care measures that may prevent or decrease complications (Diabetes Survival Skills Education) will improve Outcome: Progressing Goal: Individualized Educational Video(s) Outcome: Progressing   Problem: Coping: Goal: Ability to adjust to condition or change in health will improve Outcome: Progressing   Problem: Fluid Volume: Goal: Ability to maintain a balanced intake and output will improve Outcome: Progressing   Problem: Health Behavior/Discharge Planning: Goal: Ability to identify and utilize available resources and services will improve Outcome: Progressing Goal: Ability to manage health-related needs will improve Outcome: Progressing   Problem: Metabolic: Goal: Ability to maintain appropriate glucose levels will improve Outcome: Progressing   Problem: Nutritional: Goal: Maintenance of adequate nutrition will improve Outcome: Progressing Goal: Progress toward achieving an optimal weight will improve Outcome: Progressing   Problem: Skin Integrity: Goal: Risk for impaired skin integrity will decrease Outcome: Progressing   Problem: Tissue Perfusion: Goal: Adequacy of tissue perfusion will improve Outcome: Progressing   Problem: Education: Goal: Knowledge of General Education information will improve Description: Including pain rating scale, medication(s)/side effects and non-pharmacologic comfort measures Outcome: Progressing   Problem: Health Behavior/Discharge Planning: Goal: Ability to manage health-related needs will improve Outcome: Progressing   Problem: Clinical Measurements: Goal: Ability to maintain clinical measurements within normal limits will improve Outcome: Progressing Goal: Will remain free from infection Outcome: Progressing Goal: Diagnostic test results will improve Outcome: Progressing Goal: Respiratory complications will improve Outcome: Progressing Goal: Cardiovascular complication will  be avoided Outcome: Progressing   Problem: Nutrition: Goal: Adequate nutrition will be maintained Outcome: Progressing   Problem: Coping: Goal: Level of anxiety will decrease Outcome: Progressing   Problem: Elimination: Goal: Will not experience complications related to bowel motility Outcome: Progressing Goal: Will not experience complications related to urinary retention Outcome: Progressing   Problem: Pain Managment: Goal: General experience of comfort will improve and/or be controlled Outcome: Progressing   Problem: Safety: Goal: Ability to remain free from injury will improve Outcome: Progressing   Problem: Skin Integrity: Goal: Risk for impaired skin integrity will decrease Outcome: Progressing   Problem: Activity: Goal: Risk for activity intolerance will decrease Outcome: Not Progressing

## 2024-02-14 NOTE — Plan of Care (Signed)

## 2024-02-14 NOTE — Progress Notes (Signed)
 Progress Note   Patient: Dennis Wise FMW:[id number] DOB: [birth year] DOA: 02/07/2024     7 DOS: the patient was seen and examined on 02/14/2024   Brief hospital course: 73yo with h/o MGUS, CVA, HTN, and DW who presented on 10/19 with a fall resulting in elbow and hip fractures (possibly pathologic fractures in the setting of MGUS). He underwent ORIF of elbow fracture on 10/20 and total hip arthroplasty on 10/22.  Medically stable for STR when SNF bed is available and insurance authorization is obtained.  Assessment and Plan:  Fall with multiple fractures Syncopal episode resulting in multiple fractures Possible pathologic fracture(s) associated with MGUS L forehead lac repaired with tissue adhesive No acute injury to C spine - s/p decompression laminectomies C3-C6 and bilateral posterolateral spinal fusion from C2 through T2 Low 25-OH vitamin D, needs supplement Follow outpatient with PCP to consider bisphosphonate Multimodal pain control with tylenol  TID, robaxin TID, gabapentin TID, and oxycodone 5mg  q4h prn   L hip fracture CT L hip with comminuted fracture of the L femoral neck and intertrochanteric region with mild varus angulation  Underwent L THA on 10/22 PT/OT consulting Recommended for SNF rehab given circumstances He is medically stable for transfer to SNF when a bed is available   L elbow fracture, R Distal Second Metacarpal Fracture Plain films of L elbow with osteopenia with endosteal scalloping in the distal humerus and visualized portions of the radius and ulna (concerning for pathologic process) Intraarticular fracture of proximal ulna, angulated appearance of radial neck (fx not excluded) Age indeterminate angulated fx of distal 2nd metacarpal Underwent ORIF of elbow fracture on 10/20   AKI on CKD IIIa Baseline creatinine 1.3 Attempt to avoid nephrotoxic medications Generally stable   Syncope Reported to be positional after addition of new BP medication a few  weeks ago PT/OT consulted EEG without seizure or epileptiform discharges (right temporooccipital cortical dysfunction likely secondary to underlying structural abnromality/stroke) Echo unremarkable Consider cardiac event monitor as an outpatient   HTN Hold hydrochlorothiazide , losartan  BP currently 145/53   Post Op Anemia Transfused 2 units PRBC per orthopedics Hgb 7.5, appears to be stable currently   Hx MGUS Needs heme onc follow up outpatient    Recent C2-T2 Fusion Ortho reached out to [place] for Dr. Dow Per 10/10 note, continue hard collar another 3 weeks until follow up with Dr. Dow    Hx Stroke Old R temporooccipital infarct with encephalomalacia and ex vacuo dilation of R lateral ventricle Plavix  resumed  Continue statin Probable vascular dementia, having some delirium in the hospital   T2DM Glimeperide and actos  and metformin  on hold A1c 6.3, good control at baseline Has had hyperglycemia here so working on improved control for wound healing with DM coordinator assistance Started glargine 5 units daily, will increase to 10 units 10/27 Cover with moderate-scale SSI   Dyslipidemia Niacin  on hold Continue atorvastatin     Mood disorder Continue Lexapro    DNR I have discussed code status with the patient/family and they are in agreement that the patient would not desire resuscitation and would prefer to die a natural death should that situation arise. Patient will need a gold out of facility DNR form at the time of discharge         Consultants: Orthopedics Diabetes coordinator PT OT [facility] team   Procedures: ORIF elbow 10/20 THA 10/22   Antibiotics: Cefazolin x 4 doses  30 Day Unplanned Readmission Risk Score    Flowsheet Row ED to Hosp-Admission (Current) from  02/07/2024 in [facility]  30 Day Unplanned Readmission Risk Score (%) 16.74 Filed at 02/14/2024 0801    This score is the patient's risk of an  unplanned readmission within 30 days of being discharged (0 -100%). The score is based on dignosis, age, lab data, medications, orders, and past utilization.   Low:  0-14.9   Medium: 15-21.9   High: 22-29.9   Extreme: 30 and above           Subjective: Feeling ok.  No new complaints.   Objective: Vitals:   02/14/24 0611 02/14/24 1315  BP: (!) 153/58 (!) 145/53  Pulse: (!) 51 (!) 53  Resp: 16 20  Temp: 97.9 F (36.6 C) 98.5 F (36.9 C)  SpO2: 93% 95%   No intake or output data in the 24 hours ending 02/14/24 1827  Filed Weights   02/07/24 0622 02/08/24 1047  Weight: 105.7 kg 105.7 kg    Exam:   General:  Appears calm and comfortable and is in NAD; soft C-collar in place today Eyes:  normal lids, iris; lac above L eyebrow is improving ENT:  hard of hearing, grossly normal lips & tongue, mmm Cardiovascular:  RRR. No LE edema.  Respiratory:   CTA bilaterally with no wheezes/rales/rhonchi.  Normal respiratory effort. Abdomen:  soft, NT, ND Skin:  scattered traumatic injuries, looks better today Musculoskeletal:  LUE splinted, L hip with surgical bandage C/D/I Psychiatric:  blunted mood and affect, speech fluent and appropriate, AOx3 Neurologic:  CN 2-12 grossly intact, moves all extremities in coordinated fashion  Data Reviewed: I have reviewed the patient's lab results since admission.  Pertinent labs for today include:   Glucose 211 BUN 54/Creatinine 1.5/GFR 48, stable WBC 4.7 Hgb 7.5, stable Platelets 115, improving     Family Communication: Wife was present  Mobility: PT/OT Consulted and are recommending - Skilled Nursing-Short Term Rehab (<3 Hours/Day)02/12/2024 1354    Code Status: Limited: Do not attempt resuscitation (DNR) -DNR-LIMITED -Do Not Intubate/DNI     Disposition: Status is: Inpatient Remains inpatient appropriate because: awaiting placement     Time spent: 35 minutes  Unresulted Labs (From admission, onward)     Start      Ordered   02/15/24 0500  CBC  Tomorrow morning,   R       Question:  Specimen collection method  Answer:  Lab=Lab collect   02/14/24 1827   02/15/24 0500  Basic metabolic panel with GFR  Tomorrow morning,   R       Question:  Specimen collection method  Answer:  Lab=Lab collect   02/14/24 1827             Author: Delon Herald, MD 02/14/2024 6:27 PM  For on call review [email or web address].

## 2024-02-15 DIAGNOSIS — Z981 Arthrodesis status: Secondary | ICD-10-CM

## 2024-02-15 DIAGNOSIS — Z8673 Personal history of transient ischemic attack (TIA), and cerebral infarction without residual deficits: Secondary | ICD-10-CM

## 2024-02-15 DIAGNOSIS — W19XXXA Unspecified fall, initial encounter: Secondary | ICD-10-CM

## 2024-02-15 DIAGNOSIS — E785 Hyperlipidemia, unspecified: Secondary | ICD-10-CM | POA: Diagnosis present

## 2024-02-15 DIAGNOSIS — Z66 Do not resuscitate: Secondary | ICD-10-CM | POA: Diagnosis present

## 2024-02-15 DIAGNOSIS — S42402A Unspecified fracture of lower end of left humerus, initial encounter for closed fracture: Secondary | ICD-10-CM | POA: Diagnosis present

## 2024-02-15 DIAGNOSIS — N1831 Chronic kidney disease, stage 3a: Secondary | ICD-10-CM | POA: Diagnosis present

## 2024-02-15 DIAGNOSIS — S72002A Fracture of unspecified part of neck of left femur, initial encounter for closed fracture: Secondary | ICD-10-CM | POA: Diagnosis not present

## 2024-02-15 LAB — GLUCOSE, CAPILLARY
Glucose-Capillary: 221 mg/dL — ABNORMAL HIGH (ref 70–99)
Glucose-Capillary: 227 mg/dL — ABNORMAL HIGH (ref 70–99)
Glucose-Capillary: 183 mg/dL — ABNORMAL HIGH (ref 70–99)
Glucose-Capillary: 183 mg/dL — ABNORMAL HIGH (ref 70–99)

## 2024-02-15 LAB — CBC
HCT: 21.9 % — ABNORMAL LOW (ref 39.0–52.0)
Hemoglobin: 6.9 g/dL — CL (ref 13.0–17.0)
MCH: 28.6 pg (ref 26.0–34.0)
MCHC: 31.5 g/dL (ref 30.0–36.0)
MCV: 90.9 fL (ref 80.0–100.0)
Platelets: 113 K/uL — ABNORMAL LOW (ref 150–400)
RBC: 2.41 MIL/uL — ABNORMAL LOW (ref 4.22–5.81)
RDW: 14.5 % (ref 11.5–15.5)
WBC: 4.3 K/uL (ref 4.0–10.5)
nRBC: 0.5 % — ABNORMAL HIGH (ref 0.0–0.2)

## 2024-02-15 LAB — BASIC METABOLIC PANEL WITH GFR
Anion gap: 8 (ref 5–15)
BUN: 54 mg/dL — ABNORMAL HIGH (ref 8–23)
CO2: 23 mmol/L (ref 22–32)
Calcium: 8.6 mg/dL — ABNORMAL LOW (ref 8.9–10.3)
Chloride: 103 mmol/L (ref 98–111)
Creatinine, Ser: 1.47 mg/dL — ABNORMAL HIGH (ref 0.61–1.24)
GFR, Estimated: 49 mL/min — ABNORMAL LOW (ref >60)
Glucose, Bld: 231 mg/dL — ABNORMAL HIGH (ref 70–99)
Potassium: 5.1 mmol/L (ref 3.5–5.1)
Sodium: 134 mmol/L — ABNORMAL LOW (ref 135–145)

## 2024-02-15 LAB — PREPARE RBC (CROSSMATCH)

## 2024-02-15 MED ORDER — SODIUM CHLORIDE 0.9% IV SOLUTION
Freq: Once | INTRAVENOUS | Status: AC
Start: 2024-02-15 — End: 2024-02-15

## 2024-02-15 NOTE — Assessment & Plan Note (Signed)
 Probable vascular dementia, having some delirium in the hospital Continue Lexapro 

## 2024-02-15 NOTE — Progress Notes (Signed)
     Subjective: Patient reports pain as mild.  Hemoglobin 6.9 this morning.  Unit of blood is ordered.  Patient unsure if he mobilized over the weekend.  Hopeful he will get to work with therapy today after receiving his blood transfusion.  Yesterday's total administered Morphine Milligram Equivalents: 35   Objective:   VITALS:   Vitals:   02/14/24 0611 02/14/24 1315 02/14/24 2005 02/15/24 0547  BP: (!) 153/58 (!) 145/53 (!) 148/51 (!) 167/54  Pulse: (!) 51 (!) 53 (!) 50 (!) 51  Resp: 16 20 18 16   Temp: 97.9 F (36.6 C) 98.5 F (36.9 C) 98.2 F (36.8 C) 97.8 F (36.6 C)  TempSrc:      SpO2: 93% 95% 93% 92%  Weight:      Height:        Sensation intact distally Intact pulses distally Dorsiflexion/Plantar flexion intact Incision: dressing C/D/I Compartment soft    Lab Results  Component Value Date   WBC 4.3 02/15/2024   HGB 6.9 (LL) 02/15/2024   HCT 21.9 (L) 02/15/2024   MCV 90.9 02/15/2024   PLT 113 (L) 02/15/2024   BMET    Component Value Date/Time   NA 134 (L) 02/15/2024 0320   NA 138 01/28/2016 0804   K 5.1 02/15/2024 0320   K 4.7 01/28/2016 0804   CL 103 02/15/2024 0320   CL 103 08/30/2012 0901   CO2 23 02/15/2024 0320   CO2 24 01/28/2016 0804   GLUCOSE 231 (H) 02/15/2024 0320   GLUCOSE 263 (H) 01/28/2016 0804   GLUCOSE 155 (H) 08/30/2012 0901   BUN 54 (H) 02/15/2024 0320   BUN 23.4 01/28/2016 0804   CREATININE 1.47 (H) 02/15/2024 0320   CREATININE 1.71 (H) 02/04/2024 0911   CREATININE 1.2 01/28/2016 0804   CALCIUM  8.6 (L) 02/15/2024 0320   CALCIUM  9.1 01/28/2016 0804   EGFR 61 (L) 01/28/2016 0804   GFRNONAA 49 (L) 02/15/2024 0320   GFRNONAA 41 (L) 02/04/2024 0911      Xray: THA components in good position no adverse features  Assessment/Plan: 5 Days Post-Op   Principal Problem:   Fall at home, initial encounter Active Problems:   Essential hypertension   Normocytic anemia   Syncope   Monoclonal B-cell lymphocytosis   Mild  vascular neurocognitive disorder   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 Edna at [facility].  Address: [address], [place], [place] [address]  Office Phone: [phone]   TORIBIO DELENA EDNA 02/15/2024, 9:38 AM   Toribio Edna, MD  Contact information:   [phone] 7am-5pm epic message Dr. Edna, or call office for patient follow up: [phone] After hours and holidays please check [email or web address] for group call information for [facility]

## 2024-02-15 NOTE — Assessment & Plan Note (Addendum)
 ABLA on baseline chronic disease anemia Transfused 2 units PRBC per orthopedics Hgb up to 7.5, but now back to 6.9 so will give an additional 1 unit PRBC Recheck CBC in AM

## 2024-02-15 NOTE — Plan of Care (Signed)

## 2024-02-15 NOTE — Assessment & Plan Note (Deleted)
 Hold hydrochlorothiazide , losartan  BP currently 145/53

## 2024-02-15 NOTE — Assessment & Plan Note (Deleted)
 Reported to be positional after addition of new BP medication a few weeks ago PT/OT consulted EEG without seizure or epileptiform discharges (right temporooccipital cortical dysfunction likely secondary to underlying structural abnromality/stroke) Echo unremarkable Consider cardiac event monitor as an outpatient

## 2024-02-15 NOTE — TOC Progression Note (Signed)
 Transition of Care [facility]) - Progression Note    Patient Details  Name: Dennis Wise MRN: [id number] Date of Birth: 06/20/1950  Transition of Care [facility]) CM/SW Contact  Heather DELENA Saltness, LCSW Phone Number: 02/15/2024, 10:51 AM  Clinical Narrative:     ADDENDUM  CSW met with pt's spouse, [name], and sister-in-law at bedside to discuss SNF bed choice. Pt's spouse and SIL expressed dissatisfaction with SNF bed offers: [facility], [facility], and [facility]. SIL requests other facilities be pursued. SNF locations that aren't bad, take care of their pts, and not in a bad area of town. CSW advised of other facilities either denying pt due to being at capacity or have not reviewed referral yet. Per spouse and SIL request, CSW reached out to [place] [name] for review. TOC will await follow up.  CSW met with pt and spouse, [name], at bedside to discuss SNF bed availability and obtain pt's choice. CSW provided pt's spouse with list of facilities with available beds, including name of facility, location, and Medicare Star-Rating. Pt's spouse reports she needs time to review offers prior to making final decision. TOC will follow up with pt and spouse this afternoon.  Medicare Star-Ratings  [facility] [address] [place], [place] [address] [phone] Overall rating ?? Much below average  [facility] [address] [place], [place] [address] [phone] Overall rating ?? Much below average  [facility] and [facility] [address] [place], [place] [address] [phone] Overall rating ?? Much below average   Barriers to Discharge: Continued Medical Work up  Expected Discharge Plan and Services In-house Referral: NA Discharge Planning Services: NA   Living arrangements for the past 2 months: Skilled Nursing Facility                 DME Arranged: N/A DME  Agency: NA       HH Arranged: NA HH Agency: NA         Social Drivers of Health (SDOH) Interventions SDOH Screenings   Food Insecurity: No Food Insecurity (02/07/2024)  Housing: Low Risk  (02/07/2024)  Transportation Needs: No Transportation Needs (02/07/2024)  Utilities: Not At Risk (02/07/2024)  Alcohol Screen: Low Risk  (05/05/2023)  Depression (PHQ2-9): Low Risk  (12/29/2023)  Financial Resource Strain: Low Risk  (02/03/2024)  Physical Activity: Inactive (02/03/2024)  Social Connections: Socially Integrated (02/07/2024)  Stress: No Stress Concern Present (02/03/2024)  Tobacco Use: Low Risk  (02/10/2024)  Health Literacy: Adequate Health Literacy (05/05/2023)    Readmission Risk Interventions     No data to display          Signed: Heather Saltness, MSW, LCSW Clinical Social Worker Inpatient Care Management 02/15/2024 4:10 PM

## 2024-02-15 NOTE — Assessment & Plan Note (Signed)
 I have discussed code status with the patient/family and they are in agreement that the patient would not desire resuscitation and would prefer to die a natural death should that situation arise. Patient will need a gold out of facility DNR form at the time of discharge

## 2024-02-15 NOTE — Assessment & Plan Note (Deleted)
 Needs heme onc follow up outpatient

## 2024-02-15 NOTE — Assessment & Plan Note (Deleted)
 Old R temporooccipital infarct with encephalomalacia and ex vacuo dilation of R lateral ventricle Plavix  resumed  Continue statin

## 2024-02-15 NOTE — Assessment & Plan Note (Signed)
 Needs heme onc follow up outpatient

## 2024-02-15 NOTE — Assessment & Plan Note (Signed)
 Syncopal episode resulting in multiple fractures Possible pathologic fracture(s) associated with MGUS L forehead lac repaired with tissue adhesive No acute injury to C spine - s/p decompression laminectomies C3-C6 and bilateral posterolateral spinal fusion from C2 through T2 Low 25-OH vitamin D, needs supplement Follow outpatient with PCP to consider bisphosphonate Multimodal pain control with tylenol  TID, robaxin TID, gabapentin TID, and oxycodone 5mg  q4h prn

## 2024-02-15 NOTE — Assessment & Plan Note (Deleted)
 Glimeperide and actos  and metformin  on hold A1c 6.3, good control at baseline Has had hyperglycemia here so working on improved control for wound healing with DM coordinator assistance Started glargine 5 units daily, will increase to 10 units 10/27 Cover with moderate-scale SSI

## 2024-02-15 NOTE — Assessment & Plan Note (Signed)
 Hold hydrochlorothiazide , losartan  BP currently 145/53

## 2024-02-15 NOTE — Progress Notes (Signed)
 Physical Therapy Treatment Patient Details Name: Dennis Wise MRN: [id number] DOB: 1950-07-16 Today's Date: 02/15/2024   History of Present Illness Patient is a 73 year old male who is s/p C2-T2 surgery and receiving home health PT services who presented to the ED with pain in left hip and elbow after experiencing a syncopal episode and falling at home.  Dx with comminuted posterior proximal intraarticular ulnar Fx, comminuted left proximal femur Fx, and right metacarpal II Fx.  Patient has undergone ORIF of left elbow 10/20 (NWB, splint, platform walker OK) and THA of left hip 10/22 (PWB, posterior precautions).  PMHx includes DM II with retinopathy, HTN, HLD, syncope, bilateral carotid stenosis, ischemic right PCA CVA, fatty liver disease, left knee Fx (ORIF, 2004), left tibia Fx (ORIF, 2012), anxiety, mild vascular neurocog    PT Comments  Improved activity tolerance today, pt sat at edge of bed 25 minutes and performed L/R weight shifting and BLE strengthening exercises. Pulling up on handle of recliner with +2 mod/max assist from elevated bed pt performed sit to stand x 2 and was able to stand for ~1 minute each trial. Pt put forth good effort.     If plan is discharge home, recommend the following: Two people to help with bathing/dressing/bathroom;Two people to help with walking and/or transfers;Assist for transportation   Can travel by private vehicle     No  Equipment Recommendations  None recommended by PT    Recommendations for Other Services       Precautions / Restrictions Precautions Precautions: Fall;Cervical Recall of Precautions/Restrictions: Impaired Precaution/Restrictions Comments: no peripheral vision L eye Required Braces or Orthoses: Cervical Brace Restrictions Weight Bearing Restrictions Per Provider Order: Yes RUE Weight Bearing Per Provider Order: Weight bearing as tolerated LUE Weight Bearing Per Provider Order: Weight bear through elbow only LUE Partial  Weight Bearing Percentage or Pounds: 50 RLE Weight Bearing Per Provider Order: Weight bearing as tolerated LLE Weight Bearing Per Provider Order: Partial weight bearing LLE Partial Weight Bearing Percentage or Pounds: 50 Other Position/Activity Restrictions: LUE precautions: NWB L elbow, Ok to use platform walker, Unrestricted ROM digits, wrist and shoulder  No elbow motion as he is splinted,  Splint to remain on x 2 weeks, Start passive extension in 2 weeks, Active extension against gravity in 6 weeks  Active extension against resistance in 8 weeks , Ice and elevate     Mobility  Bed Mobility Overal bed mobility: Needs Assistance Bed Mobility: Sidelying to Sit, Sit to Supine     Supine to sit: Total assist, +2 for safety/equipment, +2 for physical assistance, HOB elevated, Used rails, Max assist Sit to supine: Total assist, +2 for physical assistance, +2 for safety/equipment, HOB elevated, Used rails   General bed mobility comments: +2 to scoot to [facility], pt able to assist with advancing RLE to EOB and partially advancing LLE using gait belt as leg lifter; +2 assist to raise trunk, pivot hips, and support LLE. Pt sat EOB x 25 minutes    Transfers Overall transfer level: Needs assistance Equipment used:  (pt pulled up on back of recliner) Transfers: Sit to/from Stand Sit to Stand: From elevated surface, +2 physical assistance, Max assist           General transfer comment: from elevated bed pt pulled up on handle on back of recliner for sit to stand x 2 trials with VCs for PWB LLE. VCs to correct flexed standing posture. Pt stood ~1 minute x 2 trials.    Ambulation/Gait  Stairs             Wheelchair Mobility     Tilt Bed    Modified Rankin (Stroke Patients Only)       Balance Overall balance assessment: Needs assistance Sitting-balance support: Feet supported, Single extremity supported Sitting balance-Leahy Scale: Fair Sitting balance -  Comments: intermittent mild posterior lean but pt able to correct with verbal cues. Performed lateral weight shifts in sitting. Postural control: Posterior lean Standing balance support: Single extremity supported Standing balance-Leahy Scale: Poor Standing balance comment: min to mod assist for standing balance                            Communication Communication Communication: Impaired Factors Affecting Communication: Hearing impaired  Cognition Arousal: Alert Behavior During Therapy: WFL for tasks assessed/performed   PT - Cognitive impairments: Attention, Initiation, Problem solving, Safety/Judgement                       PT - Cognition Comments: needs frequent repeated instructions, some limited attention to task and slow processing Following commands: Impaired Following commands impaired: Follows one step commands with increased time, Follows one step commands inconsistently    Cueing Cueing Techniques: Verbal cues, Tactile cues  Exercises Total Joint Exercises Ankle Circles/Pumps: AROM, Both, 20 reps, Supine Short Arc Quad: AAROM, Left, 10 reps, Supine Heel Slides: AAROM, Left, 10 reps, Supine Hip ABduction/ADduction: AAROM, Left, Supine, 10 reps Long Arc Quad: AROM, AAROM, Both, 20 reps, Limitations Long Arc Quad Limitations: AAROM L, AROM R General Exercises - Upper Extremity Shoulder Flexion: AAROM, Left, 10 reps, Supine Digit Composite Flexion: AROM, Left, 10 reps, Supine Composite Extension: AROM, Left, 10 reps, Supine    General Comments        Pertinent Vitals/Pain Pain Assessment Pain Score: 10-Worst pain ever Pain Location: L hip with mobility Pain Descriptors / Indicators: Discomfort, Grimacing, Guarding Pain Intervention(s): Limited activity within patient's tolerance, Monitored during session, Premedicated before session, Repositioned, Patient requesting pain meds-RN notified    Home Living                           Prior Function            PT Goals (current goals can now be found in the care plan section) Acute Rehab PT Goals Patient Stated Goal: agreeable to rehab PT Goal Formulation: With patient/family Time For Goal Achievement: 02/25/24 Potential to Achieve Goals: Fair Progress towards PT goals: Progressing toward goals    Frequency    Min 3X/week      PT Plan      Co-evaluation              AM-PAC PT 6 Clicks Mobility   Outcome Measure  Help needed turning from your back to your side while in a flat bed without using bedrails?: Total Help needed moving from lying on your back to sitting on the side of a flat bed without using bedrails?: Total Help needed moving to and from a bed to a chair (including a wheelchair)?: Total Help needed standing up from a chair using your arms (e.g., wheelchair or bedside chair)?: Total Help needed to walk in hospital room?: Total Help needed climbing 3-5 steps with a railing? : Total 6 Click Score: 6    End of Session Equipment Utilized During Treatment: Cervical collar Activity Tolerance: Patient limited by fatigue;Patient limited by  pain Patient left: in bed;with call bell/phone within reach;with bed alarm set Nurse Communication: Mobility status PT Visit Diagnosis: Other abnormalities of gait and mobility (R26.89);History of falling (Z91.81);Muscle weakness (generalized) (M62.81);Pain;Difficulty in walking, not elsewhere classified (R26.2) Pain - Right/Left: Left Pain - part of body: Hip     Time: 8663-8580 PT Time Calculation (min) (ACUTE ONLY): 43 min  Charges:    $Therapeutic Exercise: 8-22 mins $Therapeutic Activity: 23-37 mins PT General Charges $$ ACUTE PT VISIT: 1 Visit                     [name] PT 02/15/2024  Acute Rehabilitation Services  Office [phone]

## 2024-02-15 NOTE — Assessment & Plan Note (Deleted)
 Baseline creatinine 1.3 Attempt to avoid nephrotoxic medications Generally stable

## 2024-02-15 NOTE — Assessment & Plan Note (Signed)
 Old R temporooccipital infarct with encephalomalacia and ex vacuo dilation of R lateral ventricle Plavix  resumed  Continue statin

## 2024-02-15 NOTE — Assessment & Plan Note (Deleted)
 Niacin  on hold Continue atorvastatin 

## 2024-02-15 NOTE — Progress Notes (Signed)
 Progress Note   Patient: Dennis Wise FMW:[id number] DOB: Apr 28, 1950 DOA: 02/07/2024     8 DOS: the patient was seen and examined on 02/15/2024   Brief hospital course: 73yo with h/o MGUS, CVA, HTN, and DW who presented on 10/19 with a fall resulting in elbow and hip fractures (possibly pathologic fractures in the setting of MGUS). He underwent ORIF of elbow fracture on 10/20 and total hip arthroplasty on 10/22.  Medically stable for STR when SNF bed is available and insurance authorization is obtained.    Assessment & Plan Fall at home, initial encounter Syncopal episode resulting in multiple fractures Possible pathologic fracture(s) associated with MGUS L forehead lac repaired with tissue adhesive No acute injury to C spine - s/p decompression laminectomies C3-C6 and bilateral posterolateral spinal fusion from C2 through T2 Low 25-OH vitamin D, needs supplement Follow outpatient with PCP to consider bisphosphonate Multimodal pain control with tylenol  TID, robaxin TID, gabapentin TID, and oxycodone 5mg  q4h prn Closed left hip fracture (HCC) CT L hip with comminuted fracture of the L femoral neck and intertrochanteric region with mild varus angulation  Underwent L THA on 10/22 PT/OT consulting Recommended for SNF rehab given circumstances He is medically stable for transfer to SNF when a bed is available Elbow fracture, left, closed, initial encounter Plain films of L elbow with osteopenia with endosteal scalloping in the distal humerus and visualized portions of the radius and ulna (concerning for pathologic process) Intraarticular fracture of proximal ulna, angulated appearance of radial neck (fx not excluded) Age indeterminate angulated fx of distal 2nd metacarpal Underwent ORIF of elbow fracture on 10/20 Chronic kidney disease, stage 3a (HCC) Baseline creatinine 1.3 Attempt to avoid nephrotoxic medications Generally stable Syncope Reported to be positional after addition of  new BP medication a few weeks ago PT/OT consulted EEG without seizure or epileptiform discharges (right temporooccipital cortical dysfunction likely secondary to underlying structural abnromality/stroke) Echo unremarkable Consider cardiac event monitor as an outpatient Normocytic anemia ABLA on baseline chronic disease anemia Transfused 2 units PRBC per orthopedics Hgb up to 7.5, but now back to 6.9 so will give an additional 1 unit PRBC Recheck CBC in AM Mild vascular neurocognitive disorder Probable vascular dementia, having some delirium in the hospital Continue Lexapro  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 Dow Per 10/10 note, continue hard collar another 3 weeks until follow up with Dr. Dow  Dyslipidemia Niacin  on hold Continue atorvastatin  History of stroke Old R temporooccipital infarct with encephalomalacia and ex vacuo dilation of R lateral ventricle Plavix  resumed  Continue statin Essential hypertension Hold hydrochlorothiazide , losartan  BP currently 145/53 DNR (do not resuscitate) I have discussed code status with the patient/family and they are in agreement that the patient would not desire resuscitation and would prefer to die a natural death should that situation arise. Patient will need a gold out of facility DNR form at the time of discharge         Consultants: Orthopedics Diabetes coordinator PT OT [facility] team   Procedures: ORIF elbow 10/20 THA 10/22   Antibiotics: Cefazolin x 4 doses  30 Day  Unplanned Readmission Risk Score    Flowsheet Row ED to Hosp-Admission (Current) from 02/07/2024 in [facility]  30 Day Unplanned Readmission Risk Score (%) 18.03 Filed at 02/15/2024 1200    This score is the patient's risk of an unplanned readmission within 30 days of being discharged (0 -100%). The score is based on dignosis, age, lab data, medications, orders, and past utilization.   Low:  0-14.9   Medium: 15-21.9   High: 22-29.9   Extreme: 30 and above           Subjective: Feeling ok, no new concerns.   Objective: Vitals:   02/15/24 1052 02/15/24 1118  BP: (!) 153/54 (!) 167/59  Pulse: (!) 48 (!) 47  Resp: 15 16  Temp: 98.3 F (36.8 C) 98.5 F (36.9 C)  SpO2: 95% 96%   No intake or output data in the 24 hours ending 02/15/24 1253 Filed Weights   02/07/24 0622 02/08/24 1047  Weight: 105.7 kg 105.7 kg    Exam:  General:  Appears calm and comfortable and is in NAD; soft C-collar in place today Eyes:  normal lids, iris; lac above L eyebrow is improving ENT:  hard of hearing, grossly normal lips & tongue, mmm Cardiovascular:  RRR. No LE edema.  Respiratory:   CTA bilaterally with no wheezes/rales/rhonchi.  Normal respiratory effort. Abdomen:  soft, NT, ND Skin:  scattered traumatic injuries, looks better today Musculoskeletal:  LUE splinted, L hip with surgical bandage C/D/I Psychiatric:  blunted mood and affect, speech fluent and appropriate, AOx3 Neurologic:  CN 2-12 grossly intact, moves all extremities in coordinated fashion  Data Reviewed: I have reviewed the patient's lab results since admission.  Pertinent labs for today include:   Glucuse 231 BUN 54/Creatinine 1.47/GFR 49, stable WBC 4.3 Hgb 6.9 Platelets 113     Family Communication: Wife was present  Mobility: PT/OT Consulted and are recommending - Skilled Nursing-Short Term Rehab (<3 Hours/Day)02/12/2024 1354    Code Status: Limited: Do not attempt  resuscitation (DNR) -DNR-LIMITED -Do Not Intubate/DNI    Disposition: Status is: Inpatient Remains inpatient appropriate because: awaiting STR     Time spent: 35 minutes  Unresulted Labs (From admission, onward)     Start     Ordered   Unscheduled  CBC  Tomorrow morning,   R       Question:  Specimen collection method  Answer:  Lab=Lab collect   02/15/24 1253   Unscheduled  Basic metabolic panel with GFR  Tomorrow morning,   R       Question:  Specimen collection method  Answer:  Lab=Lab collect   02/15/24 1253             Author: Delon Herald, MD 02/15/2024 12:53 PM  For on call review [email or web address].

## 2024-02-15 NOTE — Assessment & Plan Note (Signed)
 Baseline creatinine 1.3 Attempt to avoid nephrotoxic medications Generally stable

## 2024-02-15 NOTE — Assessment & Plan Note (Deleted)
 Recent C2-T2 fusion Ortho reached out to [place] for Dr. Dow Per 10/10 note, continue hard collar another 3 weeks until follow up with Dr. Dow

## 2024-02-15 NOTE — Assessment & Plan Note (Deleted)
 I have discussed code status with the patient/family and they are in agreement that the patient would not desire resuscitation and would prefer to die a natural death should that situation arise. Patient will need a gold out of facility DNR form at the time of discharge

## 2024-02-15 NOTE — Assessment & Plan Note (Signed)
 Reported to be positional after addition of new BP medication a few weeks ago PT/OT consulted EEG without seizure or epileptiform discharges (right temporooccipital cortical dysfunction likely secondary to underlying structural abnromality/stroke) Echo unremarkable Consider cardiac event monitor as an outpatient

## 2024-02-15 NOTE — Plan of Care (Signed)
 Pt awoke multiple times yelling for help wanting to get up to pee was confused but easily able to reorient. Problem: Education: Goal: Ability to describe self-care measures that may prevent or decrease complications (Diabetes Survival Skills Education) will improve Outcome: Progressing Goal: Individualized Educational Video(s) Outcome: Progressing   Problem: Fluid Volume: Goal: Ability to maintain a balanced intake and output will improve Outcome: Progressing   Problem: Metabolic: Goal: Ability to maintain appropriate glucose levels will improve Outcome: Progressing   Problem: Nutritional: Goal: Maintenance of adequate nutrition will improve Outcome: Progressing   Problem: Tissue Perfusion: Goal: Adequacy of tissue perfusion will improve Outcome: Progressing   Problem: Education: Goal: Knowledge of General Education information will improve Description: Including pain rating scale, medication(s)/side effects and non-pharmacologic comfort measures Outcome: Progressing   Problem: Clinical Measurements: Goal: Ability to maintain clinical measurements within normal limits will improve Outcome: Progressing Goal: Will remain free from infection Outcome: Progressing Goal: Respiratory complications will improve Outcome: Progressing Goal: Cardiovascular complication will be avoided Outcome: Progressing   Problem: Nutrition: Goal: Adequate nutrition will be maintained Outcome: Progressing   Problem: Elimination: Goal: Will not experience complications related to bowel motility Outcome: Progressing Goal: Will not experience complications related to urinary retention Outcome: Progressing   Problem: Pain Managment: Goal: General experience of comfort will improve and/or be controlled Outcome: Progressing   Problem: Safety: Goal: Ability to remain free from injury will improve Outcome: Progressing   Problem: Skin Integrity: Goal: Risk for impaired skin integrity will  decrease Outcome: Progressing   Problem: Coping: Goal: Ability to adjust to condition or change in health will improve Outcome: Not Progressing   Problem: Health Behavior/Discharge Planning: Goal: Ability to identify and utilize available resources and services will improve Outcome: Not Progressing Goal: Ability to manage health-related needs will improve Outcome: Not Progressing   Problem: Nutritional: Goal: Progress toward achieving an optimal weight will improve Outcome: Not Progressing   Problem: Skin Integrity: Goal: Risk for impaired skin integrity will decrease Outcome: Not Progressing   Problem: Health Behavior/Discharge Planning: Goal: Ability to manage health-related needs will improve Outcome: Not Progressing   Problem: Clinical Measurements: Goal: Diagnostic test results will improve Outcome: Not Progressing   Problem: Activity: Goal: Risk for activity intolerance will decrease Outcome: Not Progressing   Problem: Coping: Goal: Level of anxiety will decrease Outcome: Not Progressing

## 2024-02-15 NOTE — Assessment & Plan Note (Signed)
 Recent C2-T2 fusion Ortho reached out to [place] for Dr. Dow Per 10/10 note, continue hard collar another 3 weeks until follow up with Dr. Dow

## 2024-02-15 NOTE — Assessment & Plan Note (Signed)
 CT L hip with comminuted fracture of the L femoral neck and intertrochanteric region with mild varus angulation  Underwent L THA on 10/22 PT/OT consulting Recommended for SNF rehab given circumstances He is medically stable for transfer to SNF when a bed is available

## 2024-02-15 NOTE — Assessment & Plan Note (Deleted)
 Syncopal episode resulting in multiple fractures Possible pathologic fracture(s) associated with MGUS L forehead lac repaired with tissue adhesive No acute injury to C spine - s/p decompression laminectomies C3-C6 and bilateral posterolateral spinal fusion from C2 through T2 Low 25-OH vitamin D, needs supplement Follow outpatient with PCP to consider bisphosphonate Multimodal pain control with tylenol  TID, robaxin TID, gabapentin TID, and oxycodone 5mg  q4h prn

## 2024-02-15 NOTE — Assessment & Plan Note (Signed)
 Plain films of L elbow with osteopenia with endosteal scalloping in the distal humerus and visualized portions of the radius and ulna (concerning for pathologic process) Intraarticular fracture of proximal ulna, angulated appearance of radial neck (fx not excluded) Age indeterminate angulated fx of distal 2nd metacarpal Underwent ORIF of elbow fracture on 10/20

## 2024-02-15 NOTE — Assessment & Plan Note (Deleted)
 CT L hip with comminuted fracture of the L femoral neck and intertrochanteric region with mild varus angulation  Underwent L THA on 10/22 PT/OT consulting Recommended for SNF rehab given circumstances He is medically stable for transfer to SNF when a bed is available

## 2024-02-15 NOTE — Assessment & Plan Note (Deleted)
 Plain films of L elbow with osteopenia with endosteal scalloping in the distal humerus and visualized portions of the radius and ulna (concerning for pathologic process) Intraarticular fracture of proximal ulna, angulated appearance of radial neck (fx not excluded) Age indeterminate angulated fx of distal 2nd metacarpal Underwent ORIF of elbow fracture on 10/20

## 2024-02-15 NOTE — Assessment & Plan Note (Signed)
 Glimeperide and actos  and metformin  on hold A1c 6.3, good control at baseline Has had hyperglycemia here so working on improved control for wound healing with DM coordinator assistance Started glargine 5 units daily, will increase to 10 units 10/27 Cover with moderate-scale SSI

## 2024-02-15 NOTE — Assessment & Plan Note (Deleted)
 ABLA on baseline chronic disease anemia Transfused 2 units PRBC per orthopedics Hgb 7.5, appears to be stable currently

## 2024-02-15 NOTE — Assessment & Plan Note (Deleted)
 Probably vascular dementia, having some delirium in the hospital Continue Lexapro 

## 2024-02-15 NOTE — Assessment & Plan Note (Signed)
 Niacin  on hold Continue atorvastatin 

## 2024-02-15 NOTE — Progress Notes (Signed)
 PT Cancellation Note  Patient Details Name: Dennis Wise MRN: [id number] DOB: 1950/05/19   Cancelled Treatment:    Reason Eval/Treat Not Completed: Medical issues which prohibited therapy (Hgb 6.9, awaiting transfusion. Will follow.)  [name] PT 02/15/2024  Acute Rehabilitation Services  Office [phone]

## 2024-02-16 DIAGNOSIS — S72002A Fracture of unspecified part of neck of left femur, initial encounter for closed fracture: Secondary | ICD-10-CM | POA: Diagnosis not present

## 2024-02-16 LAB — BPAM RBC
Blood Product Expiration Date: 202511192359
ISSUE DATE / TIME: 202510271053
Unit Type and Rh: 7300

## 2024-02-16 LAB — BASIC METABOLIC PANEL WITH GFR
Anion gap: 7 (ref 5–15)
BUN: 52 mg/dL — ABNORMAL HIGH (ref 8–23)
CO2: 23 mmol/L (ref 22–32)
Calcium: 8.8 mg/dL — ABNORMAL LOW (ref 8.9–10.3)
Chloride: 105 mmol/L (ref 98–111)
Creatinine, Ser: 1.36 mg/dL — ABNORMAL HIGH (ref 0.61–1.24)
GFR, Estimated: 54 mL/min — ABNORMAL LOW (ref >60)
Glucose, Bld: 179 mg/dL — ABNORMAL HIGH (ref 70–99)
Potassium: 5.1 mmol/L (ref 3.5–5.1)
Sodium: 134 mmol/L — ABNORMAL LOW (ref 135–145)

## 2024-02-16 LAB — CBC
HCT: 24.8 % — ABNORMAL LOW (ref 39.0–52.0)
Hemoglobin: 7.6 g/dL — ABNORMAL LOW (ref 13.0–17.0)
MCH: 28.1 pg (ref 26.0–34.0)
MCHC: 30.6 g/dL (ref 30.0–36.0)
MCV: 91.9 fL (ref 80.0–100.0)
Platelets: 135 K/uL — ABNORMAL LOW (ref 150–400)
RBC: 2.7 MIL/uL — ABNORMAL LOW (ref 4.22–5.81)
RDW: 15.2 % (ref 11.5–15.5)
WBC: 5.1 K/uL (ref 4.0–10.5)
nRBC: 0 % (ref 0.0–0.2)

## 2024-02-16 LAB — TYPE AND SCREEN
ABO/RH(D): B POS
Antibody Screen: NEGATIVE
Unit division: 0

## 2024-02-16 LAB — GLUCOSE, CAPILLARY
Glucose-Capillary: 170 mg/dL — ABNORMAL HIGH (ref 70–99)
Glucose-Capillary: 201 mg/dL — ABNORMAL HIGH (ref 70–99)
Glucose-Capillary: 222 mg/dL — ABNORMAL HIGH (ref 70–99)
Glucose-Capillary: 182 mg/dL — ABNORMAL HIGH (ref 70–99)

## 2024-02-16 MED ORDER — BISACODYL 5 MG PO TBEC
5.0000 mg | DELAYED_RELEASE_TABLET | Freq: Every day | ORAL | Status: DC | PRN
Start: 2024-02-16 — End: 2024-02-17

## 2024-02-16 MED ORDER — DOCUSATE SODIUM 100 MG PO CAPS
100.0000 mg | ORAL_CAPSULE | Freq: Two times a day (BID) | ORAL | Status: DC
  Administered 2024-02-16 – 2024-02-17 (×2): 100 mg via ORAL
  Filled 2024-02-16 (×2): qty 1

## 2024-02-16 MED ORDER — TRIPLE ANTIBIOTIC 3.5-400-5000 EX OINT: TOPICAL_OINTMENT | Freq: Once | CUTANEOUS | Status: AC

## 2024-02-16 NOTE — Progress Notes (Signed)
 Progress Note   Patient: Dennis Wise [id number] DOB: 11-May-1950 DOA: 02/07/2024     9 DOS: the patient was seen and examined on 02/16/2024   Brief hospital course: 73yo with h/o MGUS, CVA, HTN, and DW who presented on 10/19 with a fall resulting in elbow and hip fractures (possibly pathologic fractures in the setting of MGUS). He underwent ORIF of elbow fracture on 10/20 and total hip arthroplasty on 10/22.  Medically stable for STR when SNF bed is available and insurance authorization is obtained.  Assessment & Plan Fall at home, initial encounter Syncopal episode resulting in multiple fractures Possible pathologic fracture(s) associated with MGUS L forehead lac repaired with tissue adhesive No acute injury to C spine - s/p decompression laminectomies C3-C6 and bilateral posterolateral spinal fusion from C2 through T2 Low 25-OH vitamin D, needs supplement Follow outpatient with PCP to consider bisphosphonate Multimodal pain control with tylenol  TID, robaxin TID, gabapentin TID, and oxycodone 5mg  q4h prn Closed left hip fracture (HCC) CT L hip with comminuted fracture of the L femoral neck and intertrochanteric region with mild varus angulation  Underwent L THA on 10/22 PT/OT consulting Recommended for SNF rehab given circumstances He is medically stable for transfer to SNF when a bed is available Wife was considering home with home health but this appears to be a very poor plan, as patient is barely able to stand at bedside with 2+ mod/max assist and only able to stand for about a minute x 2 Elbow fracture, left, closed, initial encounter Plain films of L elbow with osteopenia with endosteal scalloping in the distal humerus and visualized portions of the radius and ulna (concerning for pathologic process) Intraarticular fracture of proximal ulna, angulated appearance of radial neck (fx not excluded) Age indeterminate angulated fx of distal 2nd metacarpal Underwent ORIF of elbow  fracture on 10/20 Chronic kidney disease, stage 3a (HCC) Baseline creatinine 1.3 Attempt to avoid nephrotoxic medications Generally stable Syncope Reported to be positional after addition of new BP medication a few weeks ago PT/OT consulted EEG without seizure or epileptiform discharges (right temporooccipital cortical dysfunction likely secondary to underlying structural abnromality/stroke) Echo unremarkable Consider cardiac event monitor as an outpatient Normocytic anemia ABLA on baseline chronic disease anemia Transfused 2 units PRBC per orthopedics Hgb up to 7.5, but now back to 6.9 so will give an additional 1 unit PRBC Recheck CBC in AM Mild vascular neurocognitive disorder Probable vascular dementia, having some delirium in the hospital Continue Lexapro  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 Dow Per 10/10 note, continue hard collar another 3 weeks until follow up with Dr. Dow  Dyslipidemia Niacin  on hold Continue atorvastatin  History of stroke Old R temporooccipital infarct with encephalomalacia and ex vacuo dilation of R lateral ventricle Plavix  resumed  Continue statin Essential hypertension Hold hydrochlorothiazide , losartan  BP currently 145/53 DNR (do not resuscitate) I have discussed code status with the patient/family and they are in agreement that the patient would not desire resuscitation and would prefer to die a natural death should that situation arise. Patient will need a gold out of facility  DNR form at  the time of discharge        Consultants: Orthopedics Diabetes coordinator PT OT [facility] team   Procedures: ORIF elbow 10/20 THA 10/22   Antibiotics: Cefazolin x 4 doses    30 Day Unplanned Readmission Risk Score    Flowsheet Row ED to Hosp-Admission (Current) from 02/07/2024 in [facility]  30 Day Unplanned Readmission Risk Score (%) 18.34 Filed at 02/16/2024 1600    This score is the patient's risk of an unplanned readmission within 30 days of being discharged (0 -100%). The score is based on dignosis, age, lab data, medications, orders, and past utilization.   Low:  0-14.9   Medium: 15-21.9   High: 22-29.9   Extreme: 30 and above           Subjective: Feeling ok.  Wife is considering home with home health vs. [name].   Objective: Vitals:   02/16/24 0552 02/16/24 1207  BP: (!) 164/58 (!) 157/60  Pulse: (!) 47 (!) 50  Resp: 18 17  Temp: 97.7 F (36.5 C) 98.3 F (36.8 C)  SpO2: 92% 96%    Intake/Output Summary (Last 24 hours) at 02/16/2024 1606 Last data filed at 02/16/2024 1441 Gross per 24 hour  Intake 480 ml  Output 2150 ml  Net -1670 ml   Filed Weights   02/07/24 0622 02/08/24 1047  Weight: 105.7 kg 105.7 kg    Exam:  General:  Appears calm and comfortable and is in NAD; soft C-collar in place Eyes:  normal lids, iris ENT:  grossly normal hearing, lips & tongue, mmm; lac above L eyebrow continues to heal Cardiovascular:  RRR. No LE edema.  Respiratory:   CTA bilaterally with no wheezes/rales/rhonchi.  Normal respiratory effort. Abdomen:  soft, NT, ND Skin:  no rash or induration seen on limited exam, healing traumatic injuries Musculoskeletal:  LUE splinted; L hip with surgical bandage C/D/I Psychiatric:  pleasant mood and affect, speech fluent and appropriate, AOx2 Neurologic:  CN 2-12 grossly intact, moves all extremities in coordinated fashion  Data Reviewed: I have reviewed the patient's lab  results since admission.  Pertinent labs for today include:   Glucose 179 BUN 52/Creatinine 1.36/GFR 54 WBC 5.1 Hgb 7.6, improved from 6.9 s/p 1 unit PRBC Platelets 135     Family Communication: Wife was present  Mobility: PT/OT Consulted and are recommending - Skilled Nursing-Short Term Rehab (<3 Hours/Day)02/15/2024 1435    Code Status: Limited: Do not attempt resuscitation (DNR) -DNR-LIMITED -Do Not Intubate/DNI    Disposition: Status is: Inpatient Remains inpatient appropriate because: awaiting placement     Time spent: 35 minutes  Unresulted Labs (From admission, onward)     Start     Ordered   02/17/24 0500  CBC  Tomorrow morning,   R       Question:  Specimen collection method  Answer:  Lab=Lab collect   02/16/24 1606   02/17/24 0500  Basic metabolic panel with GFR  Tomorrow morning,   R       Question:  Specimen collection method  Answer:  Lab=Lab collect   02/16/24 1606             Author: Delon Herald, MD 02/16/2024 4:06 PM  For on call review [email or web address].

## 2024-02-16 NOTE — Assessment & Plan Note (Signed)
 Needs heme onc follow up outpatient

## 2024-02-16 NOTE — Assessment & Plan Note (Deleted)
 Recent C2-T2 fusion Ortho reached out to [place] for Dr. Dow Per 10/10 note, continue hard collar another 3 weeks until follow up with Dr. Dow

## 2024-02-16 NOTE — Assessment & Plan Note (Deleted)
 CT L hip with comminuted fracture of the L femoral neck and intertrochanteric region with mild varus angulation  Underwent L THA on 10/22 PT/OT consulting Recommended for SNF rehab given circumstances He is medically stable for transfer to SNF when a bed is available

## 2024-02-16 NOTE — Assessment & Plan Note (Signed)
 Old R temporooccipital infarct with encephalomalacia and ex vacuo dilation of R lateral ventricle Plavix  resumed  Continue statin

## 2024-02-16 NOTE — Assessment & Plan Note (Deleted)
 Niacin  on hold Continue atorvastatin 

## 2024-02-16 NOTE — Assessment & Plan Note (Deleted)
 Hold hydrochlorothiazide , losartan  BP currently 145/53

## 2024-02-16 NOTE — Assessment & Plan Note (Deleted)
 Needs heme onc follow up outpatient

## 2024-02-16 NOTE — Plan of Care (Signed)
  Problem: Coping: Goal: Ability to adjust to condition or change in health will improve Outcome: Progressing   Problem: Metabolic: Goal: Ability to maintain appropriate glucose levels will improve Outcome: Progressing   Problem: Skin Integrity: Goal: Risk for impaired skin integrity will decrease Outcome: Progressing   Problem: Tissue Perfusion: Goal: Adequacy of tissue perfusion will improve Outcome: Progressing   Problem: Clinical Measurements: Goal: Ability to maintain clinical measurements within normal limits will improve Outcome: Progressing Goal: Will remain free from infection Outcome: Progressing   Problem: Nutrition: Goal: Adequate nutrition will be maintained Outcome: Progressing   Problem: Elimination: Goal: Will not experience complications related to bowel motility Outcome: Progressing Goal: Will not experience complications related to urinary retention Outcome: Progressing   Problem: Pain Managment: Goal: General experience of comfort will improve and/or be controlled Outcome: Progressing   Problem: Safety: Goal: Ability to remain free from injury will improve Outcome: Progressing   Problem: Skin Integrity: Goal: Risk for impaired skin integrity will decrease Outcome: Progressing

## 2024-02-16 NOTE — Assessment & Plan Note (Signed)
 Probable vascular dementia, having some delirium in the hospital Continue Lexapro 

## 2024-02-16 NOTE — Assessment & Plan Note (Deleted)
 Probable vascular dementia, having some delirium in the hospital Continue Lexapro 

## 2024-02-16 NOTE — Assessment & Plan Note (Deleted)
 I have discussed code status with the patient/family and they are in agreement that the patient would not desire resuscitation and would prefer to die a natural death should that situation arise. Patient will need a gold out of facility DNR form at the time of discharge

## 2024-02-16 NOTE — Assessment & Plan Note (Deleted)
 Plain films of L elbow with osteopenia with endosteal scalloping in the distal humerus and visualized portions of the radius and ulna (concerning for pathologic process) Intraarticular fracture of proximal ulna, angulated appearance of radial neck (fx not excluded) Age indeterminate angulated fx of distal 2nd metacarpal Underwent ORIF of elbow fracture on 10/20

## 2024-02-16 NOTE — Assessment & Plan Note (Signed)
 Baseline creatinine 1.3 Attempt to avoid nephrotoxic medications Generally stable

## 2024-02-16 NOTE — Assessment & Plan Note (Deleted)
 Baseline creatinine 1.3 Attempt to avoid nephrotoxic medications Generally stable

## 2024-02-16 NOTE — Assessment & Plan Note (Signed)
 Reported to be positional after addition of new BP medication a few weeks ago PT/OT consulted EEG without seizure or epileptiform discharges (right temporooccipital cortical dysfunction likely secondary to underlying structural abnromality/stroke) Echo unremarkable Consider cardiac event monitor as an outpatient

## 2024-02-16 NOTE — Assessment & Plan Note (Signed)
 Plain films of L elbow with osteopenia with endosteal scalloping in the distal humerus and visualized portions of the radius and ulna (concerning for pathologic process) Intraarticular fracture of proximal ulna, angulated appearance of radial neck (fx not excluded) Age indeterminate angulated fx of distal 2nd metacarpal Underwent ORIF of elbow fracture on 10/20

## 2024-02-16 NOTE — TOC Progression Note (Addendum)
 Transition of Care [facility]) - Progression Note    Patient Details  Name: Dennis Wise MRN: [id number] Date of Birth: 16-Feb-1951  [facility]) CM/SW Contact  Heather DELENA Saltness, LCSW Phone Number: 02/16/2024, 1:38 PM  Clinical Narrative:     ADDENDUM  Pt's insurance authorization for SNF rehab at [name] has been approved. Auth ID: [id number]. CSW notified Whitney at [name]. Whitney reports pt can admit to facility tomorrow. MD made aware.  CSW met with pt and spouse, [name], at bedside to discuss SNF bed offers and obtain pt/family preference. Pt's spouse reports she would like to accept bed offer at [name]. CSW notified by Benton at [name] of $48/day fee for SNF rehab. CSW informed pt and spouse of $48 per day fee. Pt and spouse accepting of daily fee. CSW notified Whitney of pt's acceptance. CSW requested insurance authorization. Authorization currently pending. TOC will continue to follow.     Barriers to Discharge: Continued Medical Work up   Expected Discharge Plan and Services In-house Referral: NA Discharge Planning Services: NA   Living arrangements for the past 2 months: Skilled Nursing Facility                 DME Arranged: N/A DME Agency: NA       HH Arranged: NA HH Agency: NA         Social Drivers of Health (SDOH) Interventions SDOH Screenings   Food Insecurity: No Food Insecurity (02/07/2024)  Housing: Low Risk  (02/07/2024)  Transportation Needs: No Transportation Needs (02/07/2024)  Utilities: Not At Risk (02/07/2024)  Alcohol Screen: Low Risk  (05/05/2023)  Depression (PHQ2-9): Low Risk  (12/29/2023)  Financial Resource Strain: Low Risk  (02/03/2024)  Physical Activity: Inactive (02/03/2024)  Social Connections: Socially Integrated (02/07/2024)  Stress: No Stress Concern Present (02/03/2024)  Tobacco Use: Low Risk  (02/10/2024)  Health Literacy: Adequate Health Literacy (05/05/2023)    Readmission Risk Interventions     No  data to display          Signed: Heather Saltness, MSW, LCSW Clinical Social Worker Inpatient Care Management 02/16/2024 1:43 PM

## 2024-02-16 NOTE — Assessment & Plan Note (Deleted)
 ABLA on baseline chronic disease anemia Transfused 2 units PRBC per orthopedics Hgb up to 7.5, but now back to 6.9 so will give an additional 1 unit PRBC Recheck CBC in AM

## 2024-02-16 NOTE — Assessment & Plan Note (Signed)
 Syncopal episode resulting in multiple fractures Possible pathologic fracture(s) associated with MGUS L forehead lac repaired with tissue adhesive No acute injury to C spine - s/p decompression laminectomies C3-C6 and bilateral posterolateral spinal fusion from C2 through T2 Low 25-OH vitamin D, needs supplement Follow outpatient with PCP to consider bisphosphonate Multimodal pain control with tylenol  TID, robaxin TID, gabapentin TID, and oxycodone 5mg  q4h prn

## 2024-02-16 NOTE — Assessment & Plan Note (Signed)
 ABLA on baseline chronic disease anemia Transfused 2 units PRBC per orthopedics Hgb up to 7.5, but now back to 6.9 so will give an additional 1 unit PRBC Recheck CBC in AM

## 2024-02-16 NOTE — Assessment & Plan Note (Signed)
 Glimeperide and actos  and metformin  on hold A1c 6.3, good control at baseline Has had hyperglycemia here so working on improved control for wound healing with DM coordinator assistance Started glargine 5 units daily, will increase to 10 units 10/27 Cover with moderate-scale SSI

## 2024-02-16 NOTE — Assessment & Plan Note (Signed)
 Recent C2-T2 fusion Ortho reached out to [place] for Dr. Dow Per 10/10 note, continue hard collar another 3 weeks until follow up with Dr. Dow

## 2024-02-16 NOTE — Assessment & Plan Note (Deleted)
 Syncopal episode resulting in multiple fractures Possible pathologic fracture(s) associated with MGUS L forehead lac repaired with tissue adhesive No acute injury to C spine - s/p decompression laminectomies C3-C6 and bilateral posterolateral spinal fusion from C2 through T2 Low 25-OH vitamin D, needs supplement Follow outpatient with PCP to consider bisphosphonate Multimodal pain control with tylenol  TID, robaxin TID, gabapentin TID, and oxycodone 5mg  q4h prn

## 2024-02-16 NOTE — Assessment & Plan Note (Signed)
 CT L hip with comminuted fracture of the L femoral neck and intertrochanteric region with mild varus angulation  Underwent L THA on 10/22 PT/OT consulting Recommended for SNF rehab given circumstances He is medically stable for transfer to SNF when a bed is available Wife was considering home with home health but this appears to be a very poor plan, as patient is barely able to stand at bedside with 2+ mod/max assist and only able to stand for about a minute x 2

## 2024-02-16 NOTE — Plan of Care (Signed)
  Problem: Education: Goal: Ability to describe self-care measures that may prevent or decrease complications (Diabetes Survival Skills Education) will improve Outcome: Progressing Goal: Individualized Educational Video(s) Outcome: Progressing   Problem: Fluid Volume: Goal: Ability to maintain a balanced intake and output will improve Outcome: Progressing   Problem: Health Behavior/Discharge Planning: Goal: Ability to identify and utilize available resources and services will improve Outcome: Progressing Goal: Ability to manage health-related needs will improve Outcome: Progressing   Problem: Metabolic: Goal: Ability to maintain appropriate glucose levels will improve Outcome: Progressing   Problem: Nutritional: Goal: Maintenance of adequate nutrition will improve Outcome: Progressing Goal: Progress toward achieving an optimal weight will improve Outcome: Progressing   Problem: Skin Integrity: Goal: Risk for impaired skin integrity will decrease Outcome: Progressing   Problem: Tissue Perfusion: Goal: Adequacy of tissue perfusion will improve Outcome: Progressing   Problem: Education: Goal: Knowledge of General Education information will improve Description: Including pain rating scale, medication(s)/side effects and non-pharmacologic comfort measures Outcome: Progressing   Problem: Clinical Measurements: Goal: Ability to maintain clinical measurements within normal limits will improve Outcome: Progressing Goal: Will remain free from infection Outcome: Progressing Goal: Diagnostic test results will improve Outcome: Progressing Goal: Respiratory complications will improve Outcome: Progressing Goal: Cardiovascular complication will be avoided Outcome: Progressing   Problem: Nutrition: Goal: Adequate nutrition will be maintained Outcome: Progressing   Problem: Elimination: Goal: Will not experience complications related to urinary retention Outcome: Progressing    Problem: Safety: Goal: Ability to remain free from injury will improve Outcome: Progressing   Problem: Skin Integrity: Goal: Risk for impaired skin integrity will decrease Outcome: Progressing   Problem: Coping: Goal: Ability to adjust to condition or change in health will improve Outcome: Not Progressing   Problem: Health Behavior/Discharge Planning: Goal: Ability to manage health-related needs will improve Outcome: Not Progressing   Problem: Activity: Goal: Risk for activity intolerance will decrease Outcome: Not Progressing   Problem: Coping: Goal: Level of anxiety will decrease Outcome: Not Progressing   Problem: Elimination: Goal: Will not experience complications related to bowel motility Outcome: Not Progressing   Problem: Pain Managment: Goal: General experience of comfort will improve and/or be controlled Outcome: Not Progressing

## 2024-02-16 NOTE — Assessment & Plan Note (Signed)
 Niacin  on hold Continue atorvastatin 

## 2024-02-16 NOTE — Assessment & Plan Note (Deleted)
 Old R temporooccipital infarct with encephalomalacia and ex vacuo dilation of R lateral ventricle Plavix  resumed  Continue statin

## 2024-02-16 NOTE — Assessment & Plan Note (Signed)
 Hold hydrochlorothiazide , losartan  BP currently 145/53

## 2024-02-16 NOTE — Assessment & Plan Note (Deleted)
 Glimeperide and actos  and metformin  on hold A1c 6.3, good control at baseline Has had hyperglycemia here so working on improved control for wound healing with DM coordinator assistance Started glargine 5 units daily, will increase to 10 units 10/27 Cover with moderate-scale SSI

## 2024-02-16 NOTE — Assessment & Plan Note (Signed)
 I have discussed code status with the patient/family and they are in agreement that the patient would not desire resuscitation and would prefer to die a natural death should that situation arise. Patient will need a gold out of facility DNR form at the time of discharge

## 2024-02-16 NOTE — Assessment & Plan Note (Deleted)
 Reported to be positional after addition of new BP medication a few weeks ago PT/OT consulted EEG without seizure or epileptiform discharges (right temporooccipital cortical dysfunction likely secondary to underlying structural abnromality/stroke) Echo unremarkable Consider cardiac event monitor as an outpatient

## 2024-02-17 ENCOUNTER — Encounter: Payer: Self-pay | Admitting: Family Medicine

## 2024-02-17 DIAGNOSIS — Z96642 Presence of left artificial hip joint: Secondary | ICD-10-CM | POA: Diagnosis not present

## 2024-02-17 DIAGNOSIS — Y92009 Unspecified place in unspecified non-institutional (private) residence as the place of occurrence of the external cause: Secondary | ICD-10-CM | POA: Diagnosis not present

## 2024-02-17 DIAGNOSIS — I10 Essential (primary) hypertension: Secondary | ICD-10-CM | POA: Diagnosis not present

## 2024-02-17 DIAGNOSIS — S72002A Fracture of unspecified part of neck of left femur, initial encounter for closed fracture: Secondary | ICD-10-CM | POA: Diagnosis not present

## 2024-02-17 DIAGNOSIS — M8448XA Pathological fracture, other site, initial encounter for fracture: Secondary | ICD-10-CM | POA: Diagnosis not present

## 2024-02-17 DIAGNOSIS — S72142D Displaced intertrochanteric fracture of left femur, subsequent encounter for closed fracture with routine healing: Secondary | ICD-10-CM | POA: Diagnosis not present

## 2024-02-17 DIAGNOSIS — W19XXXA Unspecified fall, initial encounter: Secondary | ICD-10-CM | POA: Diagnosis not present

## 2024-02-17 DIAGNOSIS — E559 Vitamin D deficiency, unspecified: Secondary | ICD-10-CM | POA: Diagnosis not present

## 2024-02-17 DIAGNOSIS — K5901 Slow transit constipation: Secondary | ICD-10-CM | POA: Diagnosis not present

## 2024-02-17 DIAGNOSIS — W19XXXD Unspecified fall, subsequent encounter: Secondary | ICD-10-CM | POA: Diagnosis not present

## 2024-02-17 DIAGNOSIS — I129 Hypertensive chronic kidney disease with stage 1 through stage 4 chronic kidney disease, or unspecified chronic kidney disease: Secondary | ICD-10-CM | POA: Diagnosis not present

## 2024-02-17 DIAGNOSIS — E113299 Type 2 diabetes mellitus with mild nonproliferative diabetic retinopathy without macular edema, unspecified eye: Secondary | ICD-10-CM | POA: Diagnosis not present

## 2024-02-17 DIAGNOSIS — Z7401 Bed confinement status: Secondary | ICD-10-CM | POA: Diagnosis not present

## 2024-02-17 DIAGNOSIS — M25552 Pain in left hip: Secondary | ICD-10-CM | POA: Diagnosis not present

## 2024-02-17 DIAGNOSIS — R269 Unspecified abnormalities of gait and mobility: Secondary | ICD-10-CM | POA: Diagnosis not present

## 2024-02-17 DIAGNOSIS — E785 Hyperlipidemia, unspecified: Secondary | ICD-10-CM | POA: Diagnosis not present

## 2024-02-17 DIAGNOSIS — Z981 Arthrodesis status: Secondary | ICD-10-CM | POA: Diagnosis not present

## 2024-02-17 DIAGNOSIS — D61818 Other pancytopenia: Secondary | ICD-10-CM | POA: Diagnosis not present

## 2024-02-17 DIAGNOSIS — N183 Chronic kidney disease, stage 3 unspecified: Secondary | ICD-10-CM | POA: Diagnosis not present

## 2024-02-17 DIAGNOSIS — S42402D Unspecified fracture of lower end of left humerus, subsequent encounter for fracture with routine healing: Secondary | ICD-10-CM | POA: Diagnosis not present

## 2024-02-17 DIAGNOSIS — D472 Monoclonal gammopathy: Secondary | ICD-10-CM | POA: Diagnosis not present

## 2024-02-17 DIAGNOSIS — R0989 Other specified symptoms and signs involving the circulatory and respiratory systems: Secondary | ICD-10-CM | POA: Diagnosis not present

## 2024-02-17 DIAGNOSIS — I69359 Hemiplegia and hemiparesis following cerebral infarction affecting unspecified side: Secondary | ICD-10-CM | POA: Diagnosis not present

## 2024-02-17 DIAGNOSIS — R55 Syncope and collapse: Secondary | ICD-10-CM | POA: Diagnosis not present

## 2024-02-17 LAB — CBC
HCT: 27.5 % — ABNORMAL LOW (ref 39.0–52.0)
Hemoglobin: 8.1 g/dL — ABNORMAL LOW (ref 13.0–17.0)
MCH: 28 pg (ref 26.0–34.0)
MCHC: 29.5 g/dL — ABNORMAL LOW (ref 30.0–36.0)
MCV: 95.2 fL (ref 80.0–100.0)
Platelets: 148 K/uL — ABNORMAL LOW (ref 150–400)
RBC: 2.89 MIL/uL — ABNORMAL LOW (ref 4.22–5.81)
RDW: 15.3 % (ref 11.5–15.5)
WBC: 5.5 K/uL (ref 4.0–10.5)
nRBC: 0 % (ref 0.0–0.2)

## 2024-02-17 LAB — BASIC METABOLIC PANEL WITH GFR
Anion gap: 10 (ref 5–15)
BUN: 48 mg/dL — ABNORMAL HIGH (ref 8–23)
CO2: 20 mmol/L — ABNORMAL LOW (ref 22–32)
Calcium: 9 mg/dL (ref 8.9–10.3)
Chloride: 104 mmol/L (ref 98–111)
Creatinine, Ser: 1.34 mg/dL — ABNORMAL HIGH (ref 0.61–1.24)
GFR, Estimated: 55 mL/min — ABNORMAL LOW (ref >60)
Glucose, Bld: 169 mg/dL — ABNORMAL HIGH (ref 70–99)
Potassium: 4.9 mmol/L (ref 3.5–5.1)
Sodium: 134 mmol/L — ABNORMAL LOW (ref 135–145)

## 2024-02-17 LAB — GLUCOSE, CAPILLARY: Glucose-Capillary: 167 mg/dL — ABNORMAL HIGH (ref 70–99)

## 2024-02-17 MED ORDER — POLYETHYLENE GLYCOL 3350 17 G PO PACK: 17.0000 g | PACK | Freq: Every day | ORAL | Status: DC

## 2024-02-17 MED ORDER — DOCUSATE SODIUM 100 MG PO CAPS: 100.0000 mg | ORAL_CAPSULE | Freq: Two times a day (BID) | ORAL | Status: DC

## 2024-02-17 MED ORDER — VITAMIN D3 25 MCG PO TABS: 2000.0000 [IU] | ORAL_TABLET | Freq: Every day | ORAL | Status: DC

## 2024-02-17 NOTE — Discharge Summary (Signed)
 Physician Discharge Summary  Dennis Wise FMW:[id number] DOB: 21-Nov-1950 DOA: 02/07/2024  PCP: [name], MD  Admit date: 02/07/2024 Discharge date: 02/17/2024  Admitted From: Home Disposition:  SNF  Discharge Condition:Stable CODE STATUS:DNR Diet recommendation: Carb Modified   Brief/Interim Summary: Patient is a 72 yo with h/o MGUS, CVA, HTN, and DW who presented on 10/19 with a fall resulting in elbow and hip fractures (possibly pathologic fractures in the setting of MGUS). He underwent ORIF of elbow fracture on 10/20 and total hip arthroplasty on 10/22.  PT recommended SNF on discharge.  Medically stable for discharge today.  He will follow-up with orthopedics as an outpatient.  Following problems were addressed during the hospitalization:  Fall at home, initial encounter Syncopal episode resulting in multiple fractures Possible pathologic fracture(s) associated with MGUS L forehead lac repaired with tissue adhesive No acute injury to C spine - s/p decompression laminectomies C3-C6 and bilateral posterolateral spinal fusion from C2 through T2 Low 25-OH vitamin D, started  supplement Follow outpatient with PCP to consider bisphosphonate Multimodal pain control   Closed left hip fracture (HCC) CT L hip with comminuted fracture of the L femoral neck and intertrochanteric region with mild varus angulation  Underwent L THA on 10/22 PT/OT consulting Recommended for SNF rehab given circumstances He is medically stable for transfer to SNF   Elbow fracture, left, closed, initial encounter Plain films of L elbow with osteopenia with endosteal scalloping in the distal humerus and visualized portions of the radius and ulna (concerning for pathologic process) Intraarticular fracture of proximal ulna, angulated appearance of radial neck (fx not excluded) Age indeterminate angulated fx of distal 2nd metacarpal Underwent ORIF of elbow fracture on 10/20  Chronic kidney disease,  stage 3a (HCC) Baseline creatinine 1.3 Attempt to avoid nephrotoxic medications  Syncope Reported to be positional after addition of new BP medication a few weeks ago PT/OT consulted EEG without seizure or epileptiform discharges (right temporooccipital cortical dysfunction likely secondary to underlying structural abnromality/stroke) Echo unremarkable  Normocytic anemia ABLA on baseline chronic disease anemia Transfused 2 units PRBC per orthopedics Hgb is stable  Mild vascular neurocognitive disorder Probable vascular dementia, having some delirium in the hospital Continue Lexapro   DM type 2 with diabetic background retinopathy (HCC) Glimeperide and actos  and metformin  on hold A1c 6.3, good control at baseline Continue home regimen  Monoclonal B-cell lymphocytosis Needs heme onc follow up outpatient   History of fusion of cervical spine Recent C2-T2 fusion Ortho reached out to [place] for Dr. Dow Per 10/10 note, continue hard collar another 3 weeks until follow up with Dr. Dow   Dyslipidemia Niacin  on hold Continue atorvastatin   History of stroke Old R temporooccipital infarct with encephalomalacia and ex vacuo dilation of R lateral ventricle Plavix  resumed  Continue statin  Essential hypertension Continue hydrochlorothiazide , losartan     Discharge Diagnoses:  Principal Problem:   Fall at home, initial encounter Active Problems:   Monoclonal B-cell lymphocytosis   Essential hypertension   Normocytic anemia   Syncope   Mild vascular neurocognitive disorder   DM type 2 with diabetic background retinopathy (HCC)   Closed left hip fracture (HCC)   Elbow fracture, left, closed, initial encounter   Chronic kidney disease, stage 3a (HCC)   History of fusion of cervical spine   Dyslipidemia   History of stroke   DNR (do not resuscitate)    Discharge Instructions  Discharge Instructions     Diet Carb Modified   Complete by: As directed  Discharge  instructions   Complete by: As directed    1)Please take your medications as instructed 2)Follow up with orthopedics in 2 weeks for wound check and x-rays. 3)Do a CBC and BMP tests in a week   Discharge wound care:   Complete by: As directed    As pre wound care nurse   Increase activity slowly   Complete by: As directed       Allergies as of 02/17/2024       Reactions   Hydralazine     Short of breath.     Promethazine Hcl    REACTION: AGITIATION        Medication List     TAKE these medications    acetaminophen  325 MG tablet Commonly known as: TYLENOL  Take 2 tablets (650 mg total) by mouth every 4 (four) hours as needed for mild pain (or temp > 37.5 C (99.5 F)).   aspirin  EC 81 MG tablet Take 1 tablet (81 mg total) by mouth 2 (two) times daily for 28 days. Swallow whole.   atorvastatin  10 MG tablet Commonly known as: LIPITOR Take 1 tablet (10 mg total) by mouth at bedtime.   clopidogrel  75 MG tablet Commonly known as: PLAVIX  TAKE 1 TABLET EVERY DAY   docusate sodium  100 MG capsule Commonly known as: COLACE Take 1 capsule (100 mg total) by mouth 2 (two) times daily.   escitalopram  10 MG tablet Commonly known as: Lexapro  Take 1 tablet (10 mg total) by mouth daily. What changed:  how much to take when to take this   glimepiride  2 MG tablet Commonly known as: AMARYL  Held as of 02/04/24 What changed:  how much to take how to take this when to take this additional instructions   hydrochlorothiazide  25 MG tablet Commonly known as: HYDRODIURIL  Take 0.5 tablets (12.5 mg total) by mouth daily.   IRON -VITAMIN C PO Take 1 tablet by mouth in the morning.   losartan  100 MG tablet Commonly known as: COZAAR  TAKE 1 TABLET EVERY DAY   memantine  5 MG tablet Commonly known as: NAMENDA  Take 1 tablet (5 mg total) by mouth 2 (two) times daily.   metFORMIN  850 MG tablet Commonly known as: GLUCOPHAGE  Take 1 tablet (850 mg total) by mouth 2 (two) times daily.    multivitamin tablet Take 1 tablet by mouth at bedtime. Centrum Silver   niacin  500 MG ER tablet Commonly known as: VITAMIN B3 Take 3 tablets (1,500 mg total) by mouth daily. What changed:  how much to take when to take this   oxyCODONE 5 MG immediate release tablet Commonly known as: Roxicodone Take 1 tablet (5 mg total) by mouth every 4 (four) hours as needed for up to 7 days for severe pain (pain score 7-10) or moderate pain (pain score 4-6).   pioglitazone  45 MG tablet Commonly known as: ACTOS  Take 1 tablet (45 mg total) by mouth daily.   polyethylene glycol 17 g packet Commonly known as: MIRALAX / GLYCOLAX Take 17 g by mouth daily.   vitamin D3 25 MCG tablet Commonly known as: CHOLECALCIFEROL Take 2 tablets (2,000 Units total) by mouth daily. Start taking on: February 18, 2024               Discharge Care Instructions  (From admission, onward)           Start     Ordered   02/17/24 0000  Discharge wound care:       Comments: As pre wound care  nurse   02/17/24 0934            Contact information for follow-up providers     [name], MD .   Specialty: [facility] Contact information: [address] [place] [address] [phone]         Edna Toribio LABOR, MD Follow up in 2 week(s).   Specialty: Orthopedic Surgery Contact information: [address] [place] [address] [phone]              Contact information for after-discharge care     Destination     [name] .   Service: Skilled Nursing Contact information: [address] [place]  [address] [phone]                    Allergies  Allergen Reactions   Hydralazine      Short of breath.     Promethazine Hcl     REACTION: AGITIATION    Consultations: Orthopedics   Procedures/Studies: DG HIP UNILAT W OR W/O PELVIS 2-3 VIEWS LEFT Result Date: 02/10/2024 CLINICAL DATA:  Postop hip fracture EXAM: DG  HIP (WITH OR WITHOUT PELVIS) 2-3V LEFT COMPARISON:  02/07/2024 FINDINGS: Interval left hip replacement with normal alignment. Long femoral stem with cerclage wire. Gas in the soft tissues consistent with recent surgery. Vascular calcifications IMPRESSION: Status post left hip replacement with expected postsurgical change. Electronically Signed   By: Luke Bun M.D.   On: 02/10/2024 18:51   DG HIP UNILAT WITH PELVIS 1V LEFT Result Date: 02/10/2024 EXAM: 6 VIEW(S) XRAY OF THE LEFT HIP 02/10/2024 03:30:00 PM Obtained portably via crm radiography in the operating room. COMPARISON: 02/07/2024 CLINICAL HISTORY: 886218 Surgery, elective 886218. Left total hip posterior approach; FT: 5 sec FD: .92 mGy Surgery, elective 886218. Left total hip posterior approach; FT: 5 sec FD: .92 mGy FINDINGS: BONES AND JOINTS: Subsequent left total hip arthroplasty for known intertrochanteric fracture of the proximal left femur. On the final images hardware components are in anatomic alignment without signs of periprosthetic fracture. SOFT TISSUES: The soft tissues are unremarkable. IMPRESSION: 1. Left total hip arthroplasty for intertrochanteric fracture, with components in anatomic alignment. 2. No periprosthetic fracture identified. Electronically signed by: Waddell Calk MD 02/10/2024 04:20 PM EDT RP Workstation: [id number]   DG C-Arm 1-60 Min-No Report Result Date: 02/10/2024 Fluoroscopy was utilized by the requesting physician.  No radiographic interpretation.   ECHOCARDIOGRAM COMPLETE Result Date: 02/09/2024    ECHOCARDIOGRAM REPORT   Patient Name:   KOLSON CHOVANEC [facility] Date of Exam: 02/09/2024 Medical Rec #:  [id number]    Height:       74.0 in Accession #:    [id number]   Weight:       233.0 lb Date of Birth:  July 26, 1950    BSA:          2.319 m Patient Age:    73 years     BP:           158/50 mmHg Patient Gender: M            HR:           49 bpm. Exam Location:  Inpatient Procedure: 2D Echo, Cardiac Doppler and Color  Doppler (Both Spectral and Color            Flow Doppler were utilized during procedure). Indications:    Syncope R55  History:        Patient has prior history of  Echocardiogram examinations, most                 recent 09/07/2022. Stroke, Signs/Symptoms:Syncope; Risk                 Factors:Hypertension and Diabetes.  Sonographer:    Thea Norlander RCS Referring Phys: [phone] KEITH PAUL IMPRESSIONS  1. Left ventricular ejection fraction, by estimation, is 65 to 70%. The left ventricle has normal function. The left ventricle has no regional wall motion abnormalities. Left ventricular diastolic parameters were normal.  2. Right ventricular systolic function is normal. The right ventricular size is normal. There is normal pulmonary artery systolic pressure.  3. Left atrial size was mildly dilated.  4. The mitral valve is normal in structure. Trivial mitral valve regurgitation. No evidence of mitral stenosis.  5. The aortic valve is tricuspid. There is mild calcification of the aortic valve. Aortic valve regurgitation is not visualized. Aortic valve sclerosis/calcification is present, without any evidence of aortic stenosis.  6. The inferior vena cava is dilated in size with >50% respiratory variability, suggesting right atrial pressure of 8 mmHg. Comparison(s): No significant change from prior study. Prior images reviewed side by side. Throughout the study the rhythm was sinus bradycardia, 45-50 bpm. FINDINGS  Left Ventricle: Left ventricular ejection fraction, by estimation, is 65 to 70%. The left ventricle has normal function. The left ventricle has no regional wall motion abnormalities. The left ventricular internal cavity size was normal in size. There is  no concentric left ventricular hypertrophy. Left ventricular diastolic parameters were normal. Right Ventricle: The right ventricular size is normal. No increase in right ventricular wall thickness. Right ventricular systolic function is normal. There is normal  pulmonary artery systolic pressure. The tricuspid regurgitant velocity is 2.51 m/s, and  with an assumed right atrial pressure of 8 mmHg, the estimated right ventricular systolic pressure is 33.2 mmHg. Left Atrium: Left atrial size was mildly dilated. Right Atrium: Right atrial size was normal in size. Pericardium: There is no evidence of pericardial effusion. Mitral Valve: The mitral valve is normal in structure. Trivial mitral valve regurgitation. No evidence of mitral valve stenosis. Tricuspid Valve: The tricuspid valve is normal in structure. Tricuspid valve regurgitation is mild. Aortic Valve: The aortic valve is tricuspid. There is mild calcification of the aortic valve. Aortic valve regurgitation is not visualized. Aortic valve sclerosis/calcification is present, without any evidence of aortic stenosis. Aortic valve peak gradient measures 15.2 mmHg. Pulmonic Valve: The pulmonic valve was normal in structure. Pulmonic valve regurgitation is not visualized. No evidence of pulmonic stenosis. Aorta: The aortic root and ascending aorta are structurally normal, with no evidence of dilitation. Venous: The inferior vena cava is dilated in size with greater than 50% respiratory variability, suggesting right atrial pressure of 8 mmHg. IAS/Shunts: No atrial level shunt detected by color flow Doppler.  LEFT VENTRICLE PLAX 2D LVIDd:         5.20 cm   Diastology LVIDs:         3.10 cm   LV e' medial:    8.55 cm/s LV PW:         1.00 cm   LV E/e' medial:  11.3 LV IVS:        1.00 cm   LV e' lateral:   10.30 cm/s LVOT diam:     2.00 cm   LV E/e' lateral: 9.4 LV SV:         100 LV SV Index:   43 LVOT Area:  3.14 cm LV IVRT:       46 msec  RIGHT VENTRICLE             IVC RV S prime:     13.40 cm/s  IVC diam: 3.00 cm TAPSE (M-mode): 2.5 cm LEFT ATRIUM             Index        RIGHT ATRIUM           Index LA diam:        4.60 cm 1.98 cm/m   RA Area:     16.20 cm LA Vol (A2C):   45.8 ml 19.75 ml/m  RA Volume:   37.00 ml   15.95 ml/m LA Vol (A4C):   85.5 ml 36.86 ml/m LA Biplane Vol: 65.3 ml 28.15 ml/m  AORTIC VALVE AV Area (Vmax): 1.95 cm AV Vmax:        195.00 cm/s AV Peak Grad:   15.2 mmHg LVOT Vmax:      121.00 cm/s LVOT Vmean:     85.300 cm/s LVOT VTI:       0.317 m  AORTA Ao Root diam: 3.40 cm Ao Asc diam:  3.60 cm MITRAL VALVE               TRICUSPID VALVE MV Area (PHT): 3.17 cm    TR Peak grad:   25.2 mmHg MV Decel Time: 239 msec    TR Vmax:        251.00 cm/s MV E velocity: 96.40 cm/s MV A velocity: 58.50 cm/s  SHUNTS MV E/A ratio:  1.65        Systemic VTI:  0.32 m                            Systemic Diam: 2.00 cm Jerel Croitoru MD Electronically signed by Jerel Balding MD Signature Date/Time: 02/09/2024/12:31:04 PM    Final    DG Elbow 2 Views Left Result Date: 02/08/2024 CLINICAL DATA:  Fracture, postop. EXAM: LEFT ELBOW - 2 VIEW COMPARISON:  Preoperative imaging FINDINGS: Plate and screw fixation of olecranon fracture. Fracture is in anatomic alignment. Overlying splint material limits osseous and soft tissue fine detail. IMPRESSION: Plate and screw fixation of olecranon fracture in anatomic alignment. Electronically Signed   By: Andrea Gasman M.D.   On: 02/08/2024 15:56   DG ELBOW COMPLETE LEFT (3+VIEW) Result Date: 02/08/2024 CLINICAL DATA:  Elective surgery. EXAM: LEFT ELBOW - COMPLETE 3+ VIEW COMPARISON:  Preoperative imaging FINDINGS: Two fluoroscopic spot views of the elbow submitted from the operating room. Plate and screw fixation of olecranon fracture. Fluoroscopy time 14.5 seconds. Dose 0.57 mGy. IMPRESSION: Intraoperative fluoroscopy during olecranon fracture fixation. Electronically Signed   By: Andrea Gasman M.D.   On: 02/08/2024 15:56   DG C-Arm 1-60 Min-No Report Result Date: 02/08/2024 Fluoroscopy was utilized by the requesting physician.  No radiographic interpretation.   DG Hand 2 View Right Result Date: 02/07/2024 CLINICAL DATA:  Right hand swelling. EXAM: RIGHT HAND - 2 VIEW  COMPARISON:  None Available. FINDINGS: Age indeterminate angulated fracture of the distal second metacarpal call suboptimally evaluated on this radiograph. Correlation with clinical exam and point tenderness recommended. No other fracture. The bones are osteopenic. No dislocation. Vascular calcifications noted. There is soft tissue swelling of the hand. No radiopaque foreign object/gas. IMPRESSION: Age indeterminate angulated fracture of the distal second metacarpal. Correlation with clinical exam and point tenderness recommended. Electronically Signed  By: Vanetta Chou M.D.   On: 02/07/2024 20:33   EEG adult Result Date: 02/07/2024 Shelton Arlin KIDD, MD     02/07/2024  3:06 PM Patient Name: READE TREFZ MRN: [id number] Epilepsy Attending: Arlin KIDD Shelton Referring Physician/Provider: Georgina Basket, MD Date: 02/07/2024 Duration: 29.14 mins Patient history: 73yo M with syncope. EEG to evaluate for seizure Level of alertness: Awake AEDs during EEG study: GBP Technical aspects: This EEG study was done with scalp electrodes positioned according to the 10-20 International system of electrode placement. Electrical activity was reviewed with band pass filter of 1-70Hz , sensitivity of 7 uV/mm, display speed of 94mm/sec with a 60Hz  notched filter applied as appropriate. EEG data were recorded continuously and digitally stored.  Video monitoring was available and reviewed as appropriate. Description: The posterior dominant rhythm consists of 8 Hz activity of moderate voltage (25-35 uV) seen predominantly in posterior head regions, symmetric and reactive to eye opening and eye closing. EEG showed continuous 5 to 7 Hz theta slowing in right temporo-occipital region. Hyperventilation and photic stimulation were not performed.   ABNORMALITY - Continuous slow, right temporo-occipital region IMPRESSION: This study is suggestive of cortical dysfunction arising from right temporo-occipital region likely secondary to  underlying structural abnormality/ stroke. No seizures or epileptiform discharges were seen throughout the recording. Arlin KIDD Shelton   CT Elbow Left Wo Contrast Result Date: 02/07/2024 CLINICAL DATA:  Trauma to the left elbow. EXAM: CT OF THE UPPER LEFT EXTREMITY WITHOUT CONTRAST TECHNIQUE: Multidetector CT imaging of the upper left extremity was performed according to the standard protocol. RADIATION DOSE REDUCTION: This exam was performed according to the departmental dose-optimization program which includes automated exposure control, adjustment of the mA and/or kV according to patient size and/or use of iterative reconstruction technique. COMPARISON:  Left elbow radiograph dated 02/07/2024. FINDINGS: Bones/Joint/Cartilage There is a comminuted intra-articular fracture of the proximal ulna with fracture lines involving the base of the olecranon process extending into the articular surface as well as fracture of the lateral aspect of the base of the coronoid process. There is approximately 3 mm distraction gap within the ulna and the olecranon fracture fragment. Additional minimally displaced fracture of the medial tip of the coronoid process. Evaluation for fracture is limited due to advanced osteopenia. Slight angulated appearance of the radial neck may be physiologic or related to degenerative changes. A fracture is less likely but not excluded (142/5). There is no dislocation. There is joint effusion and elevation of the anterior and posterior fat pads. Ligaments Suboptimally assessed by CT. Muscles and Tendons No acute findings. Soft tissues Subcutaneous edema of the dorsal elbow and forearm. IMPRESSION: 1. Comminuted intra-articular fracture of the proximal ulna. No dislocation. 2. Slight angulated appearance of the radial neck likely physiologic or related to degenerative changes. A fracture is less likely but not excluded. Electronically Signed   By: Vanetta Chou M.D.   On: 02/07/2024 13:11    DG Elbow 2 Views Left Result Date: 02/07/2024 EXAM: 1 or 2 VIEW(S) XRAY OF THE LEFT ELBOW COMPARISON: Left elbow series earlier today. CLINICAL HISTORY: Trauma following fall. Previous x-rays taken of left elbow, better lateral view was needed. AP and oblique views were acceptable. Only one lateral image sent (shot cross table), best image obtainable due to patient unable to externally rotate wrist into lateral position. FINDINGS: BONES AND JOINTS: A single lateral projection of the left elbow redemonstrates the findings described previously, with again noted comminuted posterior proximal intraarticular ulnar fracture with slight distraction of  the largest fragment, joint effusion with fat pad signs, and dorsal soft tissue swelling. Endosteal scalloping is also again noted in the shafts of the long bones. Osteopenia. No joint dislocation. SOFT TISSUES: Dorsal soft tissue swelling is noted. There are patchy calcifications in the right ulnar and radial arteries in the forearm. IMPRESSION: 1. Comminuted posterior proximal intraarticular ulnar fracture with slight distraction of the largest fragment, with associated joint effusion and dorsal soft tissue swelling, as previously described. 2. Osteopenia and endosteal scalloping. Electronically signed by: Francis Quam MD 02/07/2024 07:37 AM EDT RP Workstation: [id number]   CT Hip Left Wo Contrast Result Date: 02/07/2024 EXAM: CT OF THE LEFT HIP WITHOUT IV CONTRAST 02/07/2024 06:20:00 AM TECHNIQUE: CT of the left hip was performed without the administration of intravenous contrast. Multiplanar reformatted images are provided for review. Automated exposure control, iterative reconstruction, and/or weight based adjustment of the mA/kV was utilized to reduce the radiation dose to as low as reasonably achievable. COMPARISON: None available. CLINICAL HISTORY: Hip trauma, fracture suspected, xray done. Chief complaints; Fall on Thinners; Hip trauma, Fracture suspected,  Xray done ; TRAUMA FINDINGS: BONES: There is a comminuted fracture of the left femoral neck and intertrochanteric region. There is mild varus angulation. The left hemipelvis is intact. No aggressive appearing osseous abnormality or periostitis. SOFT TISSUE: No significant soft tissue edema or fluid collections. No soft tissue mass. JOINT: There is minimal hemarthrosis. No significant degenerative changes. No osseous erosions. VASCULATURE: There are moderate vascular calcifications. INTRAPELVIC CONTENTS: Limited images of the intrapelvic contents are unremarkable. IMPRESSION: 1. Comminuted fracture of the left femoral neck and intertrochanteric region with mild varus angulation. 2. Minimal hemarthrosis. Electronically signed by: Evalene Coho MD 02/07/2024 07:25 AM EDT RP Workstation: [id number]   CT Maxillofacial Wo Contrast Result Date: 02/07/2024 EXAM: CT OF THE FACE WITHOUT CONTRAST 02/07/2024 06:37:00 AM TECHNIQUE: CT of the face was performed without the administration of intravenous contrast. Multiplanar reformatted images are provided for review. Automated exposure control, iterative reconstruction, and/or weight based adjustment of the mA/kV was utilized to reduce the radiation dose to as low as reasonably achievable. COMPARISON: Maxillofacial CT dated 11/19/2021. CLINICAL HISTORY: Polytrauma, blunt. Chief complaints; Fall on Thinners; Polytrauma, Blunt; TRAUMA. FINDINGS: FACIAL BONES: No acute facial fracture. No mandibular dislocation. No suspicious bone lesion. ORBITS: Globes are intact. No acute traumatic injury. No inflammatory change. SINUSES AND MASTOIDS: There is mild circumferential mucosal disease within the floors of the maxillary sinuses bilaterally. SOFT TISSUES: No acute abnormality. IMPRESSION: 1. No acute facial fracture. 2. Mild circumferential mucosal disease within the floors of the maxillary sinuses bilaterally. Electronically signed by: Evalene Coho MD 02/07/2024 07:21 AM EDT  RP Workstation: [id number]   CT Cervical Spine Wo Contrast Result Date: 02/07/2024 EXAM: CT CERVICAL SPINE WITHOUT CONTRAST 02/07/2024 06:37:00 AM TECHNIQUE: CT of the cervical spine was performed without the administration of intravenous contrast. Multiplanar reformatted images are provided for review. Automated exposure control, iterative reconstruction, and/or weight based adjustment of the mA/kV was utilized to reduce the radiation dose to as low as reasonably achievable. COMPARISON: CT of the cervical spine dated 11/19/2021. CLINICAL HISTORY: Polytrauma, blunt. Chief complaints; Fall on Thinners; Polytrauma, Blunt; TRAUMA FINDINGS: CERVICAL SPINE: BONES AND ALIGNMENT: No acute fracture or traumatic malalignment. The patient is status post interval decompression laminectomies C3-C6 and status post interval bilateral posterolateral spinal fusion extending from C2-T2. DEGENERATIVE CHANGES: There is mild-to-moderate diffuse chronic degenerative disc disease throughout the cervical spine. SOFT TISSUES: There is mild-to-moderate calcification within the carotid bulbs. No  prevertebral soft tissue swelling. IMPRESSION: 1. No acute traumatic injury. 2. Status post interval decompression laminectomies C3 through C6 and bilateral posterolateral spinal fusion extending from C2 through T2. 3. Mild-to-moderate diffuse chronic degenerative disc disease throughout the cervical spine. Electronically signed by: Evalene Coho MD 02/07/2024 07:19 AM EDT RP Workstation: [id number]   CT Head Wo Contrast Result Date: 02/07/2024 EXAM: CT HEAD WITHOUT CONTRAST 02/07/2024 06:35:00 AM TECHNIQUE: CT of the head was performed without the administration of intravenous contrast. Automated exposure control, iterative reconstruction, and/or weight based adjustment of the mA/kV was utilized to reduce the radiation dose to as low as reasonably achievable. COMPARISON: CT angiogram of the head and neck dated 09/05/2022. CLINICAL  HISTORY: Polytrauma, blunt. Chief complaints; Fall on Thinners; CT Head Wo Contrast; Polytrauma, Blunt; TRAUMA FINDINGS: BRAIN AND VENTRICLES: No acute hemorrhage. No evidence of acute infarct. 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 Atrophy and chronic small vessel disease. No hydrocephalus. No extra-axial collection. No mass effect or midline shift. ORBITS: No acute abnormality. SINUSES: No acute abnormality. SOFT TISSUES AND SKULL: No acute soft tissue abnormality. No skull fracture. IMPRESSION: 1. No acute intracranial abnormality. 2. 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 3. Atrophy and chronic small vessel disease. Electronically signed by: Evalene Coho MD 02/07/2024 07:15 AM EDT RP Workstation: [id number]   DG Chest Portable 1 View Result Date: 02/07/2024 EXAM: 1 VIEW XRAY OF THE CHEST 02/07/2024 06:09:18 AM COMPARISON: PA lateral chest 04/24/2023. CLINICAL HISTORY: Fall etc. Trauma level 2/ fall on thinners; syncopal episode while standing from his computer. FINDINGS: LUNGS AND PLEURA: No focal pulmonary opacity. No pulmonary edema. No pleural effusion. No pneumothorax. HEART AND MEDIASTINUM: Mild cardiomegaly. Stable mediastinum with aortic atherosclerosis. BONES AND SOFT TISSUES: Dorsal fusion hardware is partially visible at the upper 2 thoracic spine levels. There is thoracic spondylosis. Moderate bilateral shoulder arthrosis and osteopenia. IMPRESSION: 1. No acute cardiopulmonary process. 2. Stable chest with Mild cardiomegaly. 3. Osteopenia and degenerative change. Electronically signed by: Francis Quam MD 02/07/2024 06:37 AM EDT RP Workstation: [id number]   DG Pelvis Portable Result Date: 02/07/2024 EXAM: 1 or 2 VIEW(S) XRAY OF THE PELVIS 02/07/2024 06:10:07 AM COMPARISON: Chest, abdomen, and pelvis CT 03/19/2022. CLINICAL HISTORY: Fall etc. Trauma  level 2/ fall on thinners; syncopal episode while standing from his computer. FINDINGS: BONES AND JOINTS: There is an acute transverse oblique basicervical proximal left femoral fracture with impaction at the fracture site and a mild varus angulation. There is osteopenia without other evidence for fractures including the bony pelvis and proximal right femur. No focal osseous lesion. No joint dislocation. There is mild arthrosis at the SI joints, both hips, and enthesopathic change of the pelvis. Advanced degenerative change in the visualized lower lumbar spine. SOFT TISSUES: There is extensive iliofemoral calcific arteriosclerosis. IMPRESSION: 1. Acute transverse oblique basicervical proximal left femoral fracture with impaction and mild varus angulation. 2. Advanced degenerative change in the visualized lower lumbar spine. 3. Extensive iliofemoral calcific arteriosclerosis. Electronically signed by: Francis Quam MD 02/07/2024 06:35 AM EDT RP Workstation: [id number]   DG Elbow Complete Left Result Date: 02/07/2024 EXAM: 3 VIEW(S) XRAY OF THE LEFT ELBOW COMPARISON: None available. CLINICAL HISTORY: Fall on thinners; syncopal episode while standing from his computer. FINDINGS: BONES AND JOINTS: Osteopenia. There is an acute oblique, minimally distracted and otherwise nondisplaced intraarticular comminuted fracture of the posterior aspect of the proximal ulna. There are no further displaced  fractures. There is endosteal scalloping in the distal humerus and in the visualized portions of the radius and ulna. In this person's age group this may be seen with multiple myeloma, anemias, skeletal amyloidosis, and bone metastases. It is possible this could be a pathologic fracture, but the endosteal scalloping for the most part appears limited to the shafts of these bones. Clinical correlation is needed. Positive for displaced fat pad signs consistent with joint effusion. No joint dislocation. SOFT TISSUES: Moderate  dorsal soft tissue swelling extends over the upper forearm. IMPRESSION: 1. Acute oblique, minimally distracted, otherwise nondisplaced intraarticular comminuted fracture of the posterior aspect of the proximal ulna. 2. Joint effusion with displaced fat pad signs. 3. Moderate dorsal soft tissue swelling extending over the upper forearm. 4. Osteopenia with endosteal scalloping in the distal humerus and visualized portions of the radius and ulna; concerning for potential pathologic process such as plasma cell dyscrasia, skeletal amyloidosis or metastatic disease; recommend appropriate hematologic/oncologic evaluation and follow-up imaging as indicated. 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 Electronically signed by: Francis Quam MD 02/07/2024 06:30 AM EDT RP Workstation: [id number]      Subjective: Patient seen and examined at bedside today.  Hemodynamically stable.  Alert and oriented.  Comfortable.  No new complaints.  Medically stable for discharge to SNF today.  Discharge planning discussed with wife at bedside  Discharge Exam: Vitals:   02/17/24 0443 02/17/24 0842  BP: (!) 175/60 (!) 154/71  Pulse: (!) 49 (!) 57  Resp: 20   Temp:  98.3 F (36.8 C)  SpO2: 94% 97%   Vitals:   02/16/24 1207 02/16/24 2025 02/17/24 0443 02/17/24 0842  BP: (!) 157/60 (!) 169/65 (!) 175/60 (!) 154/71  Pulse: (!) 50 (!) 44 (!) 49 (!) 57  Resp: 17 20 20    Temp: 98.3 F (36.8 C) 98.3 F (36.8 C)  98.3 F (36.8 C)  TempSrc:    Oral  SpO2: 96% 90% 94% 97%  Weight:      Height:        General: Pt is alert, awake, not in acute distress Cardiovascular: RRR, S1/S2 +, no rubs, no gallops Respiratory: CTA bilaterally, no wheezing, no rhonchi Abdominal: Soft, NT, ND, bowel sounds + Extremities: no edema, no cyanosis, dressing on the left elbow, clean surgical wound on the left    The results of significant diagnostics from this hospitalization (including imaging, microbiology,  ancillary and laboratory) are listed below for reference.     Microbiology: Recent Results (from the past 240 hours)  Culture, blood (Routine X 2) w Reflex to ID Panel     Status: None   Collection Time: 02/07/24 10:13 PM   Specimen: BLOOD  Result Value Ref Range Status   Specimen Description BLOOD SITE NOT SPECIFIED  Final   Special Requests   Final    BOTTLES DRAWN AEROBIC AND ANAEROBIC Blood Culture adequate volume   Culture   Final    NO GROWTH 5 DAYS Performed at [facility], [address] [place], [place] [address]    Report Status 02/12/2024 FINAL  Final     Labs: BNP (last 3 results) No results for input(s): BNP in the last 8760 hours. Basic Metabolic Panel: Recent Labs  Lab 02/13/24 0639 02/14/24 0613 02/15/24 0320 02/16/24 0544 02/17/24 0558  NA 135 136 134* 134* 134*  K 4.7 4.9 5.1 5.1 4.9  CL 103 104 103 105 104  CO2 23 22 23  23  20*  GLUCOSE 252* 211* 231* 179* 169*  BUN 54* 54* 54* 52* 48*  CREATININE 1.48* 1.50* 1.47* 1.36* 1.34*  CALCIUM  8.6* 8.8* 8.6* 8.8* 9.0   Liver Function Tests: Recent Labs  Lab 02/11/24 0749  AST 24  ALT <5  ALKPHOS 95  BILITOT 0.4  PROT 5.5*  ALBUMIN 3.1*   No results for input(s): LIPASE, AMYLASE in the last 168 hours. No results for input(s): AMMONIA in the last 168 hours. CBC: Recent Labs  Lab 02/12/24 0552 02/13/24 0639 02/14/24 0613 02/15/24 0320 02/16/24 0544 02/17/24 0558  WBC 6.4 5.7 4.7 4.3 5.1 5.5  NEUTROABS 4.6 4.1  --   --   --   --   HGB 7.4* 7.4* 7.5* 6.9* 7.6* 8.1*  HCT 24.7* 22.8* 24.7* 21.9* 24.8* 27.5*  MCV 95.7 90.5 93.2 90.9 91.9 95.2  PLT 104* 108* 115* 113* 135* 148*   Cardiac Enzymes: No results for input(s): CKTOTAL, CKMB, CKMBINDEX, TROPONINI in the last 168 hours. BNP: Invalid input(s): POCBNP CBG: Recent Labs  Lab 02/16/24 0741 02/16/24 1208 02/16/24 1707 02/16/24 2034 02/17/24 0742  GLUCAP 170* 201* 222* 182* 167*   D-Dimer No results for  input(s): DDIMER in the last 72 hours. Hgb A1c No results for input(s): HGBA1C in the last 72 hours. Lipid Profile No results for input(s): CHOL, HDL, LDLCALC, TRIG, CHOLHDL, LDLDIRECT in the last 72 hours. Thyroid  function studies No results for input(s): TSH, T4TOTAL, T3FREE, THYROIDAB in the last 72 hours.  Invalid input(s): FREET3 Anemia work up No results for input(s): VITAMINB12, FOLATE, FERRITIN, TIBC, IRON , RETICCTPCT in the last 72 hours. Urinalysis    Component Value Date/Time   COLORURINE YELLOW 02/08/2024 2116   APPEARANCEUR CLEAR 02/08/2024 2116   LABSPEC 1.015 02/08/2024 2116   PHURINE 5.0 02/08/2024 2116   GLUCOSEU 150 (A) 02/08/2024 2116   GLUCOSEU NEGATIVE 08/15/2021 1121   HGBUR NEGATIVE 02/08/2024 2116   BILIRUBINUR NEGATIVE 02/08/2024 2116   KETONESUR NEGATIVE 02/08/2024 2116   PROTEINUR 30 (A) 02/08/2024 2116   UROBILINOGEN 0.2 08/15/2021 1121   NITRITE NEGATIVE 02/08/2024 2116   LEUKOCYTESUR NEGATIVE 02/08/2024 2116   Sepsis Labs Recent Labs  Lab 02/14/24 0613 02/15/24 0320 02/16/24 0544 02/17/24 0558  WBC 4.7 4.3 5.1 5.5   Microbiology Recent Results (from the past 240 hours)  Culture, blood (Routine X 2) w Reflex to ID Panel     Status: None   Collection Time: 02/07/24 10:13 PM   Specimen: BLOOD  Result Value Ref Range Status   Specimen Description BLOOD SITE NOT SPECIFIED  Final   Special Requests   Final    BOTTLES DRAWN AEROBIC AND ANAEROBIC Blood Culture adequate volume   Culture   Final    NO GROWTH 5 DAYS Performed at [facility], [address] [place], [place] [address]    Report Status 02/12/2024 FINAL  Final    Please note: You were cared for by a hospitalist during your hospital stay. Once you are discharged, your primary care physician will handle any further medical issues. Please note that NO REFILLS for any discharge medications will be authorized once you are discharged, as it  is imperative that you return to your primary care physician (or establish a relationship with a primary care physician if you do not have one) for your post hospital discharge needs so that they can reassess your need for medications and monitor your lab values.    Time coordinating discharge: 40 minutes  SIGNED:  Ivonne Mustache, MD  [facility] 02/17/2024, 9:35 AM Pager [phone]  If 7PM-7AM, please contact night-coverage [email or web address] Password [email or web address]

## 2024-02-17 NOTE — TOC Transition Note (Signed)
 Transition of Care [facility]) - Discharge Note   Patient Details  Name: Dennis Wise MRN: [id number] Date of Birth: 02-11-1951  [facility] CM/SW Contact:  Heather DELENA Saltness, LCSW Phone Number: 02/17/2024, 9:11 AM   Clinical Narrative:    Pt discharging to [name] today for short-term SNF rehab. Pt accepted to room 112. D/C summary and SNF transfer report sent to [name]. D/C packet placed in pt's chart at RN station. RN to call report to [phone]. [facility] called at 9:51 AM. Pt's spouse, Thoms Barthelemy [phone], made aware and in agreement with discharge plan. No further TOC needs at this time.   Final next level of care: Skilled Nursing Facility Barriers to Discharge: Barriers Resolved   Patient Goals and CMS Choice Patient states their goals for this hospitalization and ongoing recovery are:: To go to [name] for SNF rehab CMS Medicare.gov Compare Post Acute Care list provided to:: Patient Represenative (must comment) Choice offered to / list presented to : Spouse Spencer ownership interest in [facility].provided to:: Spouse    Discharge Placement          Patient chooses bed at: [name] at [facility] Patient to be transferred to facility by: [facility] Name of family member notified: [name] Sheldon Patient and family notified of of transfer: 02/17/24  Discharge Plan and Services Additional resources added to the After Visit Summary for  Follow Up In-house Referral: NA Discharge Planning Services: NA            DME Arranged: N/A DME Agency: NA       HH Arranged: NA HH Agency: NA        Social Drivers of Health (SDOH) Interventions SDOH Screenings   Food Insecurity: No Food Insecurity (02/07/2024)  Housing: Low Risk  (02/07/2024)  Transportation Needs: No Transportation Needs (02/07/2024)  Utilities: Not At Risk (02/07/2024)  Alcohol Screen: Low Risk  (05/05/2023)  Depression (PHQ2-9): Low Risk  (12/29/2023)  Financial Resource Strain: Low Risk   (02/03/2024)  Physical Activity: Inactive (02/03/2024)  Social Connections: Socially Integrated (02/07/2024)  Stress: No Stress Concern Present (02/03/2024)  Tobacco Use: Low Risk  (02/10/2024)  Health Literacy: Adequate Health Literacy (05/05/2023)     Readmission Risk Interventions     No data to display          Signed: Heather Saltness, MSW, LCSW Clinical Social Worker Inpatient Care Management 02/17/2024 10:08 AM

## 2024-02-17 NOTE — Progress Notes (Signed)
 BLE pitting edema noted on my assessment 2+ on LLE and 1+ RLE. Noted up to thighs as well. Patient currently has NS 100mL/hr. He says he takes a diuretic at home. Patient has not received any diuretics  since coming to the hospital.  RN notified Lynwood Kipper, NP of the above information  Orders Received: Rate change NS 50mL/hr

## 2024-02-17 NOTE — Consult Note (Signed)
 WOC Nurse Consult Note: Reason for Consult: R groin blister that popped  Wound type: partial thickness R groin  Pressure Injury POA: NA  Measurement: see nursing flowsheet  Wound bed: pink moist  Drainage (amount, consistency, odor: serosanguinous   Periwound:  Dressing procedure/placement/frequency: Cleanse R groin wound with NS, apply Xeroform gauze Soila [phone]) to wound daily and secure with ABD pad or silicone foam whichever is preferred/works best.   POC discussed with bedside nurse.  Sophronia KYM Eck, Rn assistance with this consult.   WOC team will not follow. Re-consult if further needs arise.   Thank you,    Powell Bar MSN, RN-BC, [employer]

## 2024-02-17 NOTE — Plan of Care (Signed)
  Problem: Fluid Volume: Goal: Ability to maintain a balanced intake and output will improve Outcome: Progressing   Problem: Metabolic: Goal: Ability to maintain appropriate glucose levels will improve Outcome: Progressing   Problem: Skin Integrity: Goal: Risk for impaired skin integrity will decrease Outcome: Progressing   Problem: Tissue Perfusion: Goal: Adequacy of tissue perfusion will improve Outcome: Progressing   Problem: Clinical Measurements: Goal: Respiratory complications will improve Outcome: Progressing Goal: Cardiovascular complication will be avoided Outcome: Progressing   Problem: Activity: Goal: Risk for activity intolerance will decrease Outcome: Progressing   Problem: Elimination: Goal: Will not experience complications related to bowel motility Outcome: Progressing Goal: Will not experience complications related to urinary retention Outcome: Progressing   Problem: Pain Managment: Goal: General experience of comfort will improve and/or be controlled Outcome: Progressing   Problem: Safety: Goal: Ability to remain free from injury will improve Outcome: Progressing   Problem: Skin Integrity: Goal: Risk for impaired skin integrity will decrease Outcome: Progressing

## 2024-02-17 NOTE — Progress Notes (Signed)
 Per Telemetry Tech Patient's Hear rhythm is junctional HR 50 with a Qtc 400   RN notified Lynwood Kipper, NP of the above information

## 2024-02-22 ENCOUNTER — Other Ambulatory Visit: Payer: Medicare HMO

## 2024-02-22 DIAGNOSIS — R188 Other ascites: Secondary | ICD-10-CM | POA: Diagnosis not present

## 2024-02-22 DIAGNOSIS — R160 Hepatomegaly, not elsewhere classified: Secondary | ICD-10-CM | POA: Diagnosis not present

## 2024-02-22 DIAGNOSIS — M25552 Pain in left hip: Secondary | ICD-10-CM | POA: Diagnosis not present

## 2024-02-26 ENCOUNTER — Other Ambulatory Visit: Payer: Self-pay | Admitting: Surgery

## 2024-02-26 DIAGNOSIS — K8020 Calculus of gallbladder without cholecystitis without obstruction: Secondary | ICD-10-CM | POA: Diagnosis not present

## 2024-02-26 DIAGNOSIS — N390 Urinary tract infection, site not specified: Secondary | ICD-10-CM | POA: Diagnosis not present

## 2024-02-26 DIAGNOSIS — R112 Nausea with vomiting, unspecified: Secondary | ICD-10-CM | POA: Diagnosis not present

## 2024-02-26 DIAGNOSIS — R0989 Other specified symptoms and signs involving the circulatory and respiratory systems: Secondary | ICD-10-CM | POA: Diagnosis not present

## 2024-02-26 DIAGNOSIS — R110 Nausea: Secondary | ICD-10-CM | POA: Diagnosis not present

## 2024-02-29 ENCOUNTER — Encounter: Payer: Medicare HMO | Admitting: Family Medicine

## 2024-03-03 DIAGNOSIS — Z96642 Presence of left artificial hip joint: Secondary | ICD-10-CM | POA: Diagnosis not present

## 2024-03-07 DIAGNOSIS — L89153 Pressure ulcer of sacral region, stage 3: Secondary | ICD-10-CM | POA: Diagnosis not present

## 2024-03-07 DIAGNOSIS — D649 Anemia, unspecified: Secondary | ICD-10-CM | POA: Diagnosis not present

## 2024-03-07 DIAGNOSIS — M6281 Muscle weakness (generalized): Secondary | ICD-10-CM | POA: Diagnosis not present

## 2024-03-07 DIAGNOSIS — E119 Type 2 diabetes mellitus without complications: Secondary | ICD-10-CM | POA: Diagnosis not present

## 2024-03-11 DIAGNOSIS — M5412 Radiculopathy, cervical region: Secondary | ICD-10-CM | POA: Diagnosis not present

## 2024-03-11 DIAGNOSIS — Z4889 Encounter for other specified surgical aftercare: Secondary | ICD-10-CM | POA: Diagnosis not present

## 2024-03-12 NOTE — Op Note (Signed)
 DATE OF PROCEDURE:  03/12/2024  PREOPERATIVE DIAGNOSES:  LEFT INTRA-ARTICULAR OLECRANON FRACTURE.  POSTOPERATIVE DIAGNOSES:  LEFT INTRA-ARTICULAR OLECRANON FRACTURE.  PROCEDURE:  OPEN REDUCTION INTERNAL FIXATION OF LEFT INTRA-ARTICULAR OLECRANON FRACTURE with Tri-Med plate.  SURGEON:  Ozell Bruch, MD  ASSISTANT:  Francis Mt PA-C  ANESTHESIA:  General.  COMPLICATIONS:  None.  TOURNIQUET:  None.  DISPOSITION:  To PACU.  CONDITION:  Stable.  INDICATIONS FOR PROCEDURE:  Dennis Wise is a pleasant 73 y.o. with complicated recent medical history including cervical fusion, left comminuted proximal femur fracture extending up into the neck, who also has sustained an intraarticular fracture of the elbow with disruption of the extensor mechanism. I discussed with patient the risks and benefits of surgical repair, including the potential for failure to obtain an anatomic reconstruction, nerve injury, vessel injury, arthritis, loss of motion, DVT, PE, symptomatic hardware, particularly given the position of the fixation and multiple others. The patient acknowledged these risks and provided consent to proceed.   BRIEF SUMMARY OF PROCEDURE:  After administration of preoperative antibiotics, the patient was taken to the operating room where general anesthesia was induced. The right arm over a bone foam.  No tourniquet was used during the procedure.  A chlorhexidine  scrub and then a  Betadine  scrub and paint were used.  A timeout was held after standard draping, and then a slightly off midline incision made.  Dissection was carried down through subcutaneous tissue to the fracture site and disrupted extensor. Hematoma was evacuated, and the fracture ends were cleaned thoroughly, and then a stepwise attempt was made at reconstruction.  I was able to elevate the articular pieces and pin these provisionally, followed by provisional fixation of the dorsal or outside  Cortex and provisional application of the  plate.  I brought in the C-arm to gain a better look at this, and proceeded with internal fixation using a Trimed olecranon plate using a combination of standard and locked fixation with stainless screws.  Francis Mt PA-C assisted me throughout.  Hiis assistance was necessary to protect the ulnar nerve, to hold portions of the provisional reduction while I finetuned the reduction, and then also to maintain the provisional reduction while I performed final instrumentation and  fixation.  He also assisted me with wound closure, which was done with a 0 Vicryl, 2-0 Vicryl and a 2-0 nylon.  A sterile bulky dressing was applied.  The patient awakened from anesthesia and transported  to PACU in stable condition.  PROGNOSIS:  Patient will be seen and treated by Dr. Rolan Higashi for his complex left proximal femur fracture and then follow up with me in 2 weeks for removal of the sutures and progression of motion. No formal range of motion restrictions but will refrain from active extension against resistance and will need a platform walker.  There is increased risk for arthritis given the intraarticular injury, as well as loss of motion and symptomatic hardware which has been discussed.   Ozell Bruch, MD [facility], [facility] [phone]

## 2024-03-20 NOTE — Progress Notes (Incomplete)
 Dennis Wise is a very pleasant 73 y.o. RH male with a history ofDM2, hypertension, hyperlipidemia, MGUS, history of bradycardia ( per wife's report), chronic thrombocytopenia, normocytic anemia, CKD stage III A, history of acute ischemic right PCA stroke with residual left hemianopsia, anemia, recent LTHA 10/22 after syncopal episode with fall and L Hip fracture,  presenting today in follow-up for evaluation of memory loss. Patient is on ***. This patient is accompanied in the office by his wife ***  who supplements the history. Previous records as well as any outside records available were reviewed prior to todays visit.   Patient was last seen on 6/225 ***. Memory is ***. MMSE today is  /30.Patient is able to participate on ADLs and to to drive without difficulties. Mood is ***   Assessment & Plan  Continue Memantine  10 mg twice daily. Side effects were discussed  Continue to control mood as per [facility] Continue to monitor Iron  deficiency [facility] Recommend good control of cardiovascular risk factors.    Continue to control mood as per PCP  History of right PCA ischemic stroke   In review, he presented to the ED with clumsiness, unsteady gait, and 3 episodes of falls preceding the neurological events.  CT of the head at the ED showed hypodensity in the right PCA territory.  CT angio of the head and neck showed complete occlusion of the right PCA and focal atherosclerotic disease in the right greater than left MCA severe stenosis of the M2 branches, occlusion of the small right MCA branch and anterior temporal lobe stenosis, and left M2 and occluded or severely stenosed A1. Neurology was consulted, starting him on dual antiplatelet therapy with Plavix  and aspirin  for 3 months as they felt that this was due to LV disease, and then Plavix  monotherapy.  He also continued atorvastatin  10 mg daily given his LDL is at goal of 59.  Echo showed LVEF between 65 and 70%, with no intracardiac  source of embolism.        Discussed the use of AI scribe software for clinical note transcription with the patient, who gave verbal consent to proceed.  History of Present Illness     Any changes in memory since last visit?  No changes.  He continues to have some difficulty with remembering recent conversations, new information.  He reports that he has never been good with people's names.  He likes to do crossword puzzles. repeats oneself?  Endorsed Disoriented when walking into a room?  Patient denies    Misplacing objects?  Patient denies   Wandering behavior?   Denies. Any personality changes since last visit? Denies.  He is seeing a psychiatrist and psychotherapist.  Any worsening depression?: denies. He is less anxious than before   Hallucinations or paranoia?  Denies.   Seizures?   Denies.    Any sleep changes? 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 Takes Denies vivid dreams, REM behavior or sleepwalking.  He may talk in his sleep occasionally. Sleep apnea?   Denies.  Any hygiene concerns?   Denies.   Independent of bathing and dressing?  Endorsed  Does the patient needs help with medications? Patient is in charge, wife places and on the pillbox  Who is in charge of the finances?  Wife is in charge     Any changes in appetite?  Denies.     Patient have trouble swallowing?  Denies.   Does the patient cook?  No . Any kitchen accidents such as leaving the stove on?   Denies.   Any headaches? Intermittently, attributed to vision, daily tylenol , anemia and poor fluid intake.     Vision changes?  Has chronic left hemianopsia since his PCA stroke.  He sees ophthalmology at [facility]. Chronic pain?  He has chronic left shoulder pain due to left rotator cuff tear, follows with orthopedics. Doing PT after recent surgery, doing better.  Ambulates with difficulty?    Denies.   Recent falls or head injuries?  Recent falls or head injuries?  Syncopal episode resulting in multiple  fractures including closed L  hip s/p LHA 10/22, and L closed elbow, left hospital on 10/29, s/p rehab. On PT      Unilateral weakness, numbness or tingling?  Denies.   Any tremors?  Denies.   Any anosmia?    Denies.   Any incontinence of urine?  Denies.  At night he may have to get up to go to the bathroom because he drinks water  right before going to sleep. Any bowel dysfunction?  Denies.      Patient lives with his wife  Does the patient drive?  No longer drives   Neuropsych evaluation 05/07/2023 Briefly, results suggested severe impairment surrounding visuospatial/visuoconstructional abilities. Impairment was also exhibited across processing speed; however, impaired performances were directly and significantly impacted by visual deficits (i.e., slowed visual scanning) and these scores may not reflect his true abilities. Visual memory also represented a relative weakness, largely due to visual deficits impairing his ability to learn novel information efficiently and later recognize this information accurately. There was a clear and very significant impact of his left-sided hemianopsia across cognitive testing. All findings, both visuospatial/perceptual impairment and the presence of his left-sided field cut (i.e., hemianopsia), align perfectly with expectations surrounding his previously experienced right PCA stroke location.    Initial visit 08/2022   How long did patient have memory difficulties? He denies, but everyone thinks so. Patient has some difficulty remembering recent conversations and people names. He attributes it to decreased hearing and age repeats oneself? Denies  Disoriented when walking into a room?  Patient denies except occasionally not remembering what patient came to the room for   Leaving objects in unusual places? Everyone has it Keys in the wallet.   Wandering behavior? denies   Any personality changes ? denies   Any history of depression?: denies   Hallucinations  or paranoia?  denies   Seizures? denies    Any sleep changes?  Does not sleep well, thinks about different things and things he needs to do, he worries a lot-wife says. Once in a while he has some  vivid dreams, REM behavior or sleepwalking. He has been talking in sleep for a long time   Sleep apnea? denies   Any hygiene concerns?  denies   Independent of bathing and dressing?  Endorsed  Does the patient need help with medications?  Wife supervises and places them in a pillbox  Who is in charge of the finances?   Wife is in charge.      Any changes in appetite?   denies     Patient have trouble swallowing?  denies   Does the patient cook?  Any kitchen accidents such as leaving the stove on? Patient denies   Any headaches?  denies   Chronic back pain?  denies  Chronic L shoulder due to rotator cuff tear, followed with Ortho.  Ambulates with difficulty? denies   Recent  falls or head injuries? 3/24 fell on [facility] [place] while working and head hit a pile of cables.   Vision changes? Depth perception is not as good as it used to be after stroke L<R.  R is better.  Unilateral weakness, numbness or tingling?  denies , no residual after the stroke. Patient  never had a similar episode .Denies any history of  TIA. Denies vertigo dizziness. Denies  history of headaches, dysarthria or dysphagia. No confusion or seizures. Denies any chest pain, or shortness of breath. Denies any fever or chills, or night sweats.  No hormonal supplements.  Denies any recent long distance trips or recent surgeries. No sick contacts. No new stressors present in personal life. Patient is compliant with his medications. Patient is very active, exercising daily. No family history of stroke .  Any tremors?  denies   Any anosmia?  denies   Any incontinence of urine? Gets up frequently at night because he needs water  and then he goes again.   Any bowel dysfunction? denies      Patient lives with wife. History of heavy alcohol   intake? denies   History of heavy tobacco use? denies   Family history of dementia? Father had dementia ?type Does patient drive? No, stopped since the stroke due to issues with depth perception.     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      MRI of the brain in 09/05/2022, personally reviewed remarkable for acute right PCA infarct, brain atrophy asymmetrically affecting the anterior right temporal lobe      Unilateral weakness, numbness or tingling?  Denies.   Any tremors?  Denies.   Any anosmia?    Denies.   Any incontinence of urine?  Denies.   Any bowel dysfunction?  Denies.      Patient lives .*** Does the patient drive?***  Past Medical History:  Diagnosis Date   Actinic keratosis 10/16/2013   Bilateral carotid artery stenosis 09/23/2021   Less than 50% bilaterally 2023   Cervical stenosis of spinal canal    Combined form of senile cataract of both eyes 05/02/2016   Diabetic retinopathy 03/21/2005   Dupuytren contracture 03/03/2019   Essential hypertension 11/20/1999   Last Assessment & Plan: Change to 5mg  ramipril  and see if the lightheaded troubles get better.  He'll update me as needed.   [name]    Generalized anxiety disorder 10/01/2022   GERD (gastroesophageal reflux disease) 01/1989   Hearing loss 10/20/2014   Helicobacter pylori gastritis 06/11/2011   On EGD 05/2011    Hemorrhagic disorder due to circulating anticoagulants 09/27/2022   History of colonic polyps 06/05/2012   06/05/2011 - 12 polyps max 7 mm, at least 9 adenomas  04/07/2013 - no polyps - repeat colonoscopy 2019 [name], MD, [facility]        IMO SNOMED Dx Update Oct 2024     Hyperlipidemia    Internal hemorrhoids    Iron  deficiency anemia, unspecified 03/04/2011   Ischemic right PCA stroke 09/05/2022   Left homonymous hemianopsia 08/2022   Caused by right PCA stroke   MGUS (monoclonal gammopathy of unknown significance)    possible dx initially, had  negative f/u   Mild vascular neurocognitive disorder 05/07/2023   Monoclonal B-cell lymphocytosis 01/15/2022   NAFLD (nonalcoholic fatty liver disease)    Nocturia 08/18/2021   Normocytic anemia 04/03/2021   Obesity (BMI 30-39.9) 10/16/2013   Pain in joint of left knee 09/27/2022   Pancytopenia, acquired  05/01/2011   Pneumonia    Pseudophakia, both eyes 07/17/2016   Pure hypercholesterolemia 04/21/1988   Last Assessment & Plan: Continue statin, continue work on diet, d/w pt about labs.  He agrees.   Shoulder pain 07/02/2022   Syncope 11/22/2021   Thrombocytopenia 05/03/2021   Tibial fracture 10/24/2010   L tibia.  Repair 2012.    L tibia.  Repair 2012.  Last Assessment & Plan:  Per ortho.     Type II diabetes mellitus 04/21/1988   With h/o dec in sensation on feet     Unstable ankle 09/12/2020     Past Surgical History:  Procedure Laterality Date   CATARACT EXTRACTION Bilateral    CERVICAL FUSION  01/05/2024   C2-T2   COLONOSCOPY     ESOPHAGOGASTRODUODENOSCOPY     2012 H. pylori gastritis   FLEXIBLE SIGMOIDOSCOPY  05/1988   internal hemorrhoids   KNEE SURGERY  12/2002   lt knee fx repair ORIF   ORIF ELBOW FRACTURE Left 02/08/2024   Procedure: OPEN REDUCTION INTERNAL FIXATION (ORIF) ELBOW/OLECRANON FRACTURE;  Surgeon: Celena Sharper, MD;  Location: [facility] OR;  Service: Orthopedics;  Laterality: Left;   TIBIA FRACTURE SURGERY     left 2012   TOTAL HIP ARTHROPLASTY Left 02/10/2024   Procedure: ARTHROPLASTY, HIP, TOTAL,POSTERIOR APPROACH;  Surgeon: Edna Toribio LABOR, MD;  Location: [facility] ORS;  Service: Orthopedics;  Laterality: Left;      Results      Objective:     PHYSICAL EXAMINATION:    VITALS:  There were no vitals filed for this visit.  GEN:  The patient appears stated age and is in NAD. HEENT:  Normocephalic, atraumatic.   Neurological examination:  General: NAD, well-groomed, appears stated age. Orientation: The patient is alert. Oriented to person,  place and not to date.*** Cranial nerves: There is good facial symmetry.The speech is fluent and clear. No aphasia or dysarthria. Fund of knowledge is appropriate. Recent memory impaired and remote memory is normal.  Attention and concentration are normal.  Able to name objects and repeat phrases.  Hearing is intact to conversational tone ***.   Delayed recall *** Sensation: Sensation is intact to light touch throughout Motor: Strength is at least antigravity x4. DTR's 2/4 in UE/LE      09/17/2022    1:00 PM  Montreal Cognitive Assessment   Visuospatial/ Executive (0/5) 1  Naming (0/3) 2  Attention: Read list of digits (0/2) 2  Attention: Read list of letters (0/1) 1  Attention: Serial 7 subtraction starting at 100 (0/3) 2  Language: Repeat phrase (0/2) 1  Language : Fluency (0/1) 1  Abstraction (0/2) 2  Delayed Recall (0/5) 2  Orientation (0/6) 5  Total 19  Adjusted Score (based on education) 20       03/23/2023   10:00 AM 02/21/2019    2:03 PM 01/30/2017    9:40 AM  MMSE - Mini Mental State Exam  Orientation to time 5 5 5    Orientation to Place 5 5 5    Registration 3 3 3    Attention/ Calculation 4 5 0   Recall 3 3 3    Language- name 2 objects 2  0   Language- repeat 1 1 1   Language- follow 3 step command 3  3   Language- read & follow direction 1  0   Write a sentence 1  0   Copy design 0  0   Total score 28  20      Data saved  with a previous flowsheet row definition     Results     Movement examination: Tone: There is normal tone in the UE/LE Abnormal movements:  no tremor.  No myoclonus.  No asterixis.   Coordination:  There is no decremation with RAM's. Normal finger to nose  Gait and Station: The patient has no difficulty arising out of a deep-seated chair without the use of the hands. The patient's stride length is good.  Gait is cautious and narrow.   Thank you for allowing us  the opportunity to participate in the care of this nice patient. Please do not  hesitate to contact us  for any questions or concerns.   Total time spent on today's visit was *** minutes dedicated to this patient today, preparing to see patient, examining the patient, ordering tests and/or medications and counseling the patient, documenting clinical information in the EHR or other health record, independently interpreting results and communicating results to the patient/family, discussing treatment and goals, answering patient's questions and coordinating care.  Cc:  [name], MD  Camie Sevin 03/20/2024 5:20 PM        ,

## 2024-03-21 ENCOUNTER — Telehealth: Payer: Self-pay

## 2024-03-21 NOTE — Transitions of Care (Post Inpatient/ED Visit) (Signed)
 03/21/2024  Name: Dennis Wise MRN: [id number] DOB: [birth year]  Today's TOC FU Call Status: Today's TOC FU Call Status:: Successful TOC FU Call Completed TOC FU Call Complete Date: 03/21/24  Patient's Name and Date of Birth confirmed. DOB, Name  Transition Care Management Follow-up Telephone Call Date of Discharge: 03/16/24 Discharge Facility: Other [facility]) Name of Other (Non[facility]) Discharge Facility: [facility] Type of Discharge: Inpatient Admission Primary Inpatient Discharge Diagnosis:: Left hip fracture How have you been since you were released from the hospital?: Better Any questions or concerns?: Yes Patient Questions/Concerns:: (S) Patient has pressure ulcer to left buttocks Patient Questions/Concerns Addressed: Notified Provider of Patient Questions/Concerns  Items Reviewed: Did you receive and understand the discharge instructions provided?: Yes Medications obtained,verified, and reconciled?: Yes (Medications Reviewed) Dietary orders reviewed?: NA Do you have support at home?: Yes People in Home [RPT]: spouse  Medications Reviewed Today: Medications Reviewed Today     Reviewed by Lavelle Charmaine NOVAK, LPN (Licensed Practical Nurse) on 03/21/24 at [phone]  Med List Status: <None>   Medication Order Taking? Sig Documenting Provider Last Dose Status Informant  acetaminophen  (TYLENOL ) 325 MG tablet [id number] No Take 2 tablets (650 mg total) by mouth every 4 (four) hours as needed for mild pain (or temp > 37.5 C (99.5 F)). Sheikh, Omair Latif, DO Past Week Active Spouse/Significant Other  atorvastatin  (LIPITOR) 10 MG tablet [id number] No Take 1 tablet (10 mg total) by mouth at bedtime. [name], MD 02/06/2024 Bedtime Active Spouse/Significant Other  cholecalciferol  (CHOLECALCIFEROL ) 25 MCG tablet [id number]  Take 2 tablets (2,000 Units total) by mouth daily. Jillian Buttery, MD  Active   clopidogrel  (PLAVIX ) 75 MG tablet [id number] No TAKE 1 TABLET  EVERY DAY [name], MD 02/06/2024 Morning Active Spouse/Significant Other  docusate sodium  (COLACE) 100 MG capsule [id number]  Take 1 capsule (100 mg total) by mouth 2 (two) times daily. Jillian Buttery, MD  Active   escitalopram  (LEXAPRO ) 10 MG tablet [id number] No Take 1 tablet (10 mg total) by mouth daily.  Patient taking differently: Take 5 mg by mouth in the morning.   Saucier, Jonathan Lee, NP 02/06/2024 Morning Active Spouse/Significant Other  glimepiride  (AMARYL ) 2 MG tablet [id number] No Held as of 02/04/24  Patient taking differently: Take 2 mg by mouth at bedtime.   [name], MD 02/06/2024 Bedtime Active Spouse/Significant Other           Med Note (MARROW, ERIN T   Sun Feb 07, 2024 10:00 AM) Per the pt's wife the pt started back taking this medication yesterday due to elevated B/G levels  hydrochlorothiazide  (HYDRODIURIL ) 25 MG tablet [id number] No Take 0.5 tablets (12.5 mg total) by mouth daily. [name], MD 02/06/2024 Morning Active Spouse/Significant Other  IRON -VITAMIN C PO [id number] No Take 1 tablet by mouth in the morning. [provider] 02/06/2024 Morning Active Spouse/Significant Other  losartan  (COZAAR ) 100 MG tablet [id number] No TAKE 1 TABLET EVERY DAY [name], MD 02/06/2024 Morning Active Spouse/Significant Other  memantine  (NAMENDA ) 5 MG tablet [id number] No Take 1 tablet (5 mg total) by mouth 2 (two) times daily. Wertman, Sara E, PA-C 02/06/2024 Evening Active Spouse/Significant Other  metFORMIN  (GLUCOPHAGE ) 850 MG tablet [id number] No Take 1 tablet (850 mg total) by mouth 2 (two) times daily. [name], MD 02/06/2024 Evening Active Spouse/Significant Other  Multiple Vitamin (MULTIVITAMIN) tablet [id number] No Take 1 tablet by mouth at bedtime. Centrum Silver [provider] 02/06/2024 Bedtime Active Spouse/Significant Other  niacin  (NIASPAN ) 500 MG CR tablet [id number] No Take 3 tablets (1,500 mg total) by mouth daily.   Patient taking differently: Take 500 mg by mouth 2 (two) times daily.   [name], MD 02/06/2024 Evening Active Spouse/Significant Other  pioglitazone  (ACTOS ) 45 MG tablet [id number] No Take 1 tablet (45 mg total) by mouth daily. [name], MD 02/06/2024 Bedtime Active Spouse/Significant Other  polyethylene glycol (MIRALAX  / GLYCOLAX ) 17 g packet [id number]  Take 17 g by mouth daily. Jillian Buttery, MD  Active             Home Care and Equipment/Supplies: Were Home Health Services Ordered?: Yes Name of Home Health Agency:: [facility] Has Agency set up a time to come to your home?: Yes Any new equipment or medical supplies ordered?: Yes Were you able to get the equipment/medical supplies?: Yes Do you have any questions related to the use of the equipment/supplies?: No  Functional Questionnaire: Do you need assistance with bathing/showering or dressing?: Yes Do you need assistance with meal preparation?: Yes Do you need assistance with eating?: No Do you have difficulty maintaining continence: No Do you need assistance with getting out of bed/getting out of a chair/moving?: Yes Do you have difficulty managing or taking your medications?: No  Follow up appointments reviewed: PCP Follow-up appointment confirmed?: Yes Date of PCP follow-up appointment?: 03/24/24 Follow-up Provider: Dr. Cleatus Do you need transportation to your follow-up appointment?: No Do you understand care options if your condition(s) worsen?: Yes-patient verbalized understanding    SIGNATURE Charmaine Bloodgood, LPN [facility] Health Advisor [place] [facility] You Are. We Are. One [facility] Direct Dial [phone]

## 2024-03-21 NOTE — Telephone Encounter (Signed)
Noted. Thanks. Will see at [facility].  

## 2024-03-23 ENCOUNTER — Ambulatory Visit: Admitting: Physician Assistant

## 2024-03-23 ENCOUNTER — Encounter: Payer: Self-pay | Admitting: Oncology

## 2024-03-24 ENCOUNTER — Encounter: Payer: Self-pay | Admitting: Family Medicine

## 2024-03-24 ENCOUNTER — Ambulatory Visit: Admitting: Family Medicine

## 2024-03-24 VITALS — BP 122/42 | HR 54 | Temp 97.9°F | Ht 74.0 in | Wt 224.5 lb

## 2024-03-24 DIAGNOSIS — Z8781 Personal history of (healed) traumatic fracture: Secondary | ICD-10-CM

## 2024-03-24 DIAGNOSIS — I10 Essential (primary) hypertension: Secondary | ICD-10-CM

## 2024-03-24 DIAGNOSIS — L89159 Pressure ulcer of sacral region, unspecified stage: Secondary | ICD-10-CM

## 2024-03-24 DIAGNOSIS — E113299 Type 2 diabetes mellitus with mild nonproliferative diabetic retinopathy without macular edema, unspecified eye: Secondary | ICD-10-CM

## 2024-03-24 DIAGNOSIS — D72820 Lymphocytosis (symptomatic): Secondary | ICD-10-CM

## 2024-03-24 MED ORDER — NIACIN ER (ANTIHYPERLIPIDEMIC) 500 MG PO TBCR: 500.0000 mg | EXTENDED_RELEASE_TABLET | Freq: Two times a day (BID) | ORAL | Status: AC

## 2024-03-24 MED ORDER — HYDROCHLOROTHIAZIDE 25 MG PO TABS: 12.5000 mg | ORAL_TABLET | Freq: Every day | ORAL | Status: AC

## 2024-03-24 MED ORDER — GLIMEPIRIDE 2 MG PO TABS: 2.0000 mg | ORAL_TABLET | Freq: Every day | ORAL | Status: DC

## 2024-03-24 NOTE — Patient Instructions (Addendum)
 Go to the lab on the way out.   If you have mychart we'll likely use that to update you.    Please call about seeing hematology when possible.  Cut hydrochlorothiazide  back to 12.5mg  a day.  Update me as needed.  Take care.  Glad to see you.  Keep pressure off the spot on your backside.  Don't use peroxide.

## 2024-03-24 NOTE — Progress Notes (Unsigned)
 S/p ORIF elbow and total hip replacement after a fall.  In wheelchair at [facility].  Using walker at home.  Taking tylenol  as needed for pain.   He is taking lexapro  10mg .    Patient has pressure ulcer to buttocks, see exam.   Diabetes:  Using medications without difficulties: yes Hypoglycemic episodes:no Hyperglycemic episodes:no Feet problems:no Blood Sugars averaging: 128 this AM.  This is lower than typical recent readings.   Labs pending.   Hypertension:    Using medication without problems or lightheadedness: yes Chest pain with exertion:no Edema:no Short of breath:no Lower BP noted. D/w pt about dropping hydrochlorothiazide  back to 12.5mg  from 25mg  given lower BP.   Discussed him having hematology f/u when possible.   PMH and SH reviewed  Meds, vitals, and allergies reviewed.   ROS: Per HPI unless specifically indicated in ROS section   GEN: nad, alert and oriented HEENT: ncat NECK: supple w/o LA CV: rrr. PULM: ctab, no inc wob ABD: soft, +bs EXT: no edema SKIN: well perfused.  Small healing sacral pressure ulcer. Lesions is ~1cm diameter, superficial and w/o surrounding erythema.  No slough.  Clean based.

## 2024-03-25 LAB — CBC WITH DIFFERENTIAL/PLATELET
Basophils Absolute: 0.1 K/uL (ref 0.0–0.1)
Basophils Relative: 1.4 % (ref 0.0–3.0)
Eosinophils Absolute: 0.2 K/uL (ref 0.0–0.7)
Eosinophils Relative: 3.1 % (ref 0.0–5.0)
HCT: 28.5 % — ABNORMAL LOW (ref 39.0–52.0)
Hemoglobin: 9.6 g/dL — ABNORMAL LOW (ref 13.0–17.0)
Lymphocytes Relative: 25.5 % (ref 12.0–46.0)
Lymphs Abs: 1.3 K/uL (ref 0.7–4.0)
MCHC: 33.7 g/dL (ref 30.0–36.0)
MCV: 86.2 fl (ref 78.0–100.0)
Monocytes Absolute: 0.4 K/uL (ref 0.1–1.0)
Monocytes Relative: 6.9 % (ref 3.0–12.0)
Neutro Abs: 3.3 K/uL (ref 1.4–7.7)
Neutrophils Relative %: 63.1 % (ref 43.0–77.0)
Platelets: 101 K/uL — ABNORMAL LOW (ref 150.0–400.0)
RBC: 3.3 Mil/uL — ABNORMAL LOW (ref 4.22–5.81)
RDW: 19 % — ABNORMAL HIGH (ref 11.5–15.5)
WBC: 5.1 K/uL (ref 4.0–10.5)

## 2024-03-25 LAB — COMPREHENSIVE METABOLIC PANEL WITH GFR
ALT: 9 U/L (ref 0–53)
AST: 15 U/L (ref 0–37)
Albumin: 3.9 g/dL (ref 3.5–5.2)
Alkaline Phosphatase: 137 U/L — ABNORMAL HIGH (ref 39–117)
BUN: 31 mg/dL — ABNORMAL HIGH (ref 6–23)
CO2: 27 meq/L (ref 19–32)
Calcium: 9.1 mg/dL (ref 8.4–10.5)
Chloride: 103 meq/L (ref 96–112)
Creatinine, Ser: 1.22 mg/dL (ref 0.40–1.50)
GFR: 57.91 mL/min — ABNORMAL LOW (ref >60.00)
Glucose, Bld: 129 mg/dL — ABNORMAL HIGH (ref 70–99)
Potassium: 4.5 meq/L (ref 3.5–5.1)
Sodium: 139 meq/L (ref 135–145)
Total Bilirubin: 0.6 mg/dL (ref 0.2–1.2)
Total Protein: 6.4 g/dL (ref 6.0–8.3)

## 2024-03-25 LAB — HEMOGLOBIN A1C: Hgb A1c MFr Bld: 5.6 % (ref 4.6–6.5)

## 2024-03-27 ENCOUNTER — Ambulatory Visit: Payer: Self-pay | Admitting: Family Medicine

## 2024-03-27 DIAGNOSIS — Z8781 Personal history of (healed) traumatic fracture: Secondary | ICD-10-CM | POA: Insufficient documentation

## 2024-03-27 DIAGNOSIS — L89159 Pressure ulcer of sacral region, unspecified stage: Secondary | ICD-10-CM | POA: Insufficient documentation

## 2024-03-27 DIAGNOSIS — D649 Anemia, unspecified: Secondary | ICD-10-CM

## 2024-03-27 DIAGNOSIS — E113299 Type 2 diabetes mellitus with mild nonproliferative diabetic retinopathy without macular edema, unspecified eye: Secondary | ICD-10-CM

## 2024-03-27 NOTE — Assessment & Plan Note (Signed)
 Small, superficial, appears to be healing.  Should resolve with increased protein intake and not having prolonged pressure in the area.  Discussed offloading when sitting and trying to be up when possible.  Wife can keep checking the area and update me as needed.  Advised not to clean with peroxide but to keep it clean otherwise.

## 2024-03-27 NOTE — Assessment & Plan Note (Signed)
 He is taking Tylenol  for pain and using his walker at home.  Continue as is for now.

## 2024-03-27 NOTE — Assessment & Plan Note (Signed)
 Cut hydrochlorothiazide  back to 12.5mg  a day.  See notes on labs.

## 2024-03-27 NOTE — Assessment & Plan Note (Signed)
 I asked him to please call about seeing hematology when possible.

## 2024-03-27 NOTE — Assessment & Plan Note (Signed)
 See notes on labs.  Continue glimepiride  metformin  Actos .

## 2024-03-28 ENCOUNTER — Telehealth: Payer: Self-pay | Admitting: Family Medicine

## 2024-03-28 NOTE — Telephone Encounter (Signed)
 Copied from CRM [id number]. Topic: General - Other >> Mar 28, 2024  2:32 PM Rosina BIRCH wrote: Reason for CRM: katlyn from [facility] called wanting to know if a plan of care order was received on 12/3. I called [facility] and they told me they have not received it and to refax

## 2024-03-29 ENCOUNTER — Ambulatory Visit: Admitting: Psychiatry

## 2024-03-29 DIAGNOSIS — E1122 Type 2 diabetes mellitus with diabetic chronic kidney disease: Secondary | ICD-10-CM | POA: Diagnosis not present

## 2024-03-29 DIAGNOSIS — S7290XD Unspecified fracture of unspecified femur, subsequent encounter for closed fracture with routine healing: Secondary | ICD-10-CM | POA: Diagnosis not present

## 2024-03-29 DIAGNOSIS — N400 Enlarged prostate without lower urinary tract symptoms: Secondary | ICD-10-CM | POA: Diagnosis not present

## 2024-03-29 DIAGNOSIS — L89152 Pressure ulcer of sacral region, stage 2: Secondary | ICD-10-CM | POA: Diagnosis not present

## 2024-03-29 DIAGNOSIS — S42402D Unspecified fracture of lower end of left humerus, subsequent encounter for fracture with routine healing: Secondary | ICD-10-CM | POA: Diagnosis not present

## 2024-03-29 DIAGNOSIS — I129 Hypertensive chronic kidney disease with stage 1 through stage 4 chronic kidney disease, or unspecified chronic kidney disease: Secondary | ICD-10-CM | POA: Diagnosis not present

## 2024-03-29 DIAGNOSIS — N183 Chronic kidney disease, stage 3 unspecified: Secondary | ICD-10-CM | POA: Diagnosis not present

## 2024-03-29 DIAGNOSIS — E11319 Type 2 diabetes mellitus with unspecified diabetic retinopathy without macular edema: Secondary | ICD-10-CM | POA: Diagnosis not present

## 2024-03-29 DIAGNOSIS — L570 Actinic keratosis: Secondary | ICD-10-CM | POA: Diagnosis not present

## 2024-03-29 NOTE — Telephone Encounter (Signed)
 Printed POC order and placed in Dr Elfredia in box on his desk.

## 2024-03-29 NOTE — Telephone Encounter (Signed)
 Home Health orders are in [facility] [address]. These will need to be printed and placed in his in box.

## 2024-03-29 NOTE — Telephone Encounter (Signed)
 Form signed. Thanks!

## 2024-04-01 NOTE — Telephone Encounter (Signed)
 Forms have been faxed

## 2024-04-04 ENCOUNTER — Ambulatory Visit: Payer: Self-pay

## 2024-04-04 NOTE — Telephone Encounter (Signed)
 Noted. Thanks.

## 2024-04-04 NOTE — Telephone Encounter (Signed)
 Previously contacted.   Copied from CRM #[id number]. Topic: Clinical - Red Word Triage >> Apr 04, 2024 11:22 AM Suzen RAMAN wrote: Red Word that prompted transfer to Nurse Triage: fell yesterday back in chair and hit head, now has bruise and currently in blood thinner. Home health suggested ED.

## 2024-04-04 NOTE — Telephone Encounter (Signed)
 FYI Only or Action Required?: FYI only for provider: will monitor closely.  Patient was last seen in primary care on 03/24/2024 by [name], MD.  Called Nurse Triage reporting Head Injury.  Symptoms began several days ago.  Interventions attempted: Rest, hydration, or home remedies.  Symptoms are: completely resolved.  Triage Disposition: Home Care  Patient/caregiver understands and will follow disposition?: Yes  Reason for Disposition  Small bruise is present  Answer Assessment - Initial Assessment Questions Pt lost balance 2 days ago on 04/02/24, falling forward into a chair and striking his head on an end table. Denies LOC. Has bruising on left brow, size is smaller than a fingertip. Denies any headache, dizziness, nausea, vomiting, vision changes, confusion, ataxia.   Patient and wife to monitor for onset of any symptoms, up to and including headache, dizziness, nausea, vomiting, vision changes, confusion, ataxia and see emergency medical attention if they occur.   Patient's wife verbalized understanding.   1. MECHANISM: How did the injury happen? For falls, ask: What height did you fall from? and What surface did you fall against?      See above 2. ONSET: When did the injury happen? (e.g., minutes, hours ago)      04/02/24 3. NEUROLOGIC SYMPTOMS: Was there any loss of consciousness? Are there any other neurological symptoms?      Denies 4. MENTAL STATUS: Does the person know who they are, who you are, and where they are?      CAOx3 5. LOCATION: What part of the head was hit?      Left forehead 6. SCALP APPEARANCE: What does the scalp look like? Is it bleeding now? If Yes, ask: Is it difficult to stop?      Bruising at brow 7. SIZE: For cuts, bruises, or swelling, ask: How large is it? (e.g., inches or centimeters)      Smaller than fingertip sized bruise 8. PAIN: Is there any pain? If Yes, ask: How bad is it? (Scale 0-10; or none, mild,  moderate, severe)     Denies 9. TETANUS: For any breaks in the skin, ask: When was your last tetanus booster?     October 2025 in the ED 10. BLOOD THINNERS: Do you take any blood thinners? (e.g., aspirin , clopidogrel  / Plavix , coumadin, heparin ). Notes: Other strong blood thinners include: Arixtra (fondaparinux), Eliquis (apixaban), Pradaxa (dabigatran), and Xarelto (rivaroxaban).       Yes  Protocols used: Falls and [facility] Copied from CRM #[id number]. Topic: Clinical - Red Word Triage >> Apr 04, 2024  3:50 PM Avram MATSU wrote: Red Word that prompted transfer to Nurse Triage: had a fall yesterday and hit his head, he is on a blood thinner. Past Medical History:  Diagnosis Date   Actinic keratosis 10/16/2013   Bilateral carotid artery stenosis 09/23/2021   Less than 50% bilaterally 2023   Cervical stenosis of spinal canal    Combined form of senile cataract of both eyes 05/02/2016   Diabetic retinopathy 03/21/2005   Dupuytren contracture 03/03/2019   Essential hypertension 11/20/1999   Last Assessment & Plan: Change to 5mg  ramipril  and see if the lightheaded troubles get better.  He'll update me as needed.   [name]    Generalized anxiety disorder 10/01/2022   GERD (gastroesophageal reflux disease) 01/1989   Hearing loss 10/20/2014   Helicobacter pylori gastritis 06/11/2011   On EGD 05/2011    Hemorrhagic disorder due to circulating anticoagulants 09/27/2022   History of colonic polyps 06/05/2012  06/05/2011 - 12 polyps max 7 mm, at least 9 adenomas  04/07/2013 - no polyps - repeat colonoscopy 2019 [name], MD, [facility]        IMO SNOMED Dx Update Oct 2024     Hyperlipidemia    Internal hemorrhoids    Iron  deficiency anemia, unspecified 03/04/2011   Ischemic right PCA stroke 09/05/2022   Left homonymous hemianopsia 08/2022   Caused by right PCA stroke   MGUS (monoclonal gammopathy of unknown significance)    possible dx initially, had negative f/u   Mild vascular  neurocognitive disorder 05/07/2023   Monoclonal B-cell lymphocytosis 01/15/2022   NAFLD (nonalcoholic fatty liver disease)    Nocturia 08/18/2021   Normocytic anemia 04/03/2021   Obesity (BMI 30-39.9) 10/16/2013   Pain in joint of left knee 09/27/2022   Pancytopenia, acquired 05/01/2011   Pneumonia    Pseudophakia, both eyes 07/17/2016   Pure hypercholesterolemia 04/21/1988   Last Assessment & Plan: Continue statin, continue work on diet, d/w pt about labs.  He agrees.   Shoulder pain 07/02/2022   Syncope 11/22/2021   Thrombocytopenia 05/03/2021   Tibial fracture 10/24/2010   L tibia.  Repair 2012.    L tibia.  Repair 2012.  Last Assessment & Plan:  Per ortho.     Type II diabetes mellitus 04/21/1988   With h/o dec in sensation on feet     Unstable ankle 09/12/2020

## 2024-04-05 NOTE — Telephone Encounter (Signed)
 Triage note viewed by pcp.  No further action needed at this time.

## 2024-04-11 ENCOUNTER — Encounter (HOSPITAL_COMMUNITY): Payer: Self-pay

## 2024-04-11 ENCOUNTER — Other Ambulatory Visit: Payer: Self-pay

## 2024-04-11 ENCOUNTER — Emergency Department (HOSPITAL_COMMUNITY)

## 2024-04-11 ENCOUNTER — Inpatient Hospital Stay (HOSPITAL_COMMUNITY)
Admission: EM | Admit: 2024-04-11 | Discharge: 2024-04-23 | DRG: 481 | Disposition: A | Discharge status: Skilled Nursing Facility | Attending: Internal Medicine | Admitting: Internal Medicine

## 2024-04-11 DIAGNOSIS — D696 Thrombocytopenia, unspecified: Secondary | ICD-10-CM | POA: Diagnosis not present

## 2024-04-11 DIAGNOSIS — E113299 Type 2 diabetes mellitus with mild nonproliferative diabetic retinopathy without macular edema, unspecified eye: Secondary | ICD-10-CM | POA: Diagnosis present

## 2024-04-11 DIAGNOSIS — D631 Anemia in chronic kidney disease: Secondary | ICD-10-CM | POA: Diagnosis present

## 2024-04-11 DIAGNOSIS — I10 Essential (primary) hypertension: Secondary | ICD-10-CM | POA: Diagnosis present

## 2024-04-11 DIAGNOSIS — Z7902 Long term (current) use of antithrombotics/antiplatelets: Secondary | ICD-10-CM | POA: Diagnosis not present

## 2024-04-11 DIAGNOSIS — S72002A Fracture of unspecified part of neck of left femur, initial encounter for closed fracture: Principal | ICD-10-CM | POA: Diagnosis present

## 2024-04-11 DIAGNOSIS — E7800 Pure hypercholesterolemia, unspecified: Secondary | ICD-10-CM | POA: Diagnosis present

## 2024-04-11 DIAGNOSIS — E1122 Type 2 diabetes mellitus with diabetic chronic kidney disease: Secondary | ICD-10-CM | POA: Diagnosis present

## 2024-04-11 DIAGNOSIS — E1165 Type 2 diabetes mellitus with hyperglycemia: Secondary | ICD-10-CM | POA: Diagnosis present

## 2024-04-11 DIAGNOSIS — Z7984 Long term (current) use of oral hypoglycemic drugs: Secondary | ICD-10-CM | POA: Diagnosis not present

## 2024-04-11 DIAGNOSIS — N1831 Chronic kidney disease, stage 3a: Secondary | ICD-10-CM | POA: Diagnosis present

## 2024-04-11 DIAGNOSIS — Z808 Family history of malignant neoplasm of other organs or systems: Secondary | ICD-10-CM

## 2024-04-11 DIAGNOSIS — Z860100 Personal history of colon polyps, unspecified: Secondary | ICD-10-CM

## 2024-04-11 DIAGNOSIS — Z8673 Personal history of transient ischemic attack (TIA), and cerebral infarction without residual deficits: Secondary | ICD-10-CM

## 2024-04-11 DIAGNOSIS — Y92009 Unspecified place in unspecified non-institutional (private) residence as the place of occurrence of the external cause: Secondary | ICD-10-CM | POA: Diagnosis not present

## 2024-04-11 DIAGNOSIS — D62 Acute posthemorrhagic anemia: Secondary | ICD-10-CM | POA: Diagnosis present

## 2024-04-11 DIAGNOSIS — E875 Hyperkalemia: Secondary | ICD-10-CM | POA: Diagnosis present

## 2024-04-11 DIAGNOSIS — Z825 Family history of asthma and other chronic lower respiratory diseases: Secondary | ICD-10-CM

## 2024-04-11 DIAGNOSIS — D6959 Other secondary thrombocytopenia: Secondary | ICD-10-CM | POA: Diagnosis present

## 2024-04-11 DIAGNOSIS — Z794 Long term (current) use of insulin: Secondary | ICD-10-CM

## 2024-04-11 DIAGNOSIS — Z820 Family history of epilepsy and other diseases of the nervous system: Secondary | ICD-10-CM

## 2024-04-11 DIAGNOSIS — Z833 Family history of diabetes mellitus: Secondary | ICD-10-CM

## 2024-04-11 DIAGNOSIS — D509 Iron deficiency anemia, unspecified: Secondary | ICD-10-CM | POA: Diagnosis present

## 2024-04-11 DIAGNOSIS — Z9842 Cataract extraction status, left eye: Secondary | ICD-10-CM

## 2024-04-11 DIAGNOSIS — E66811 Obesity, class 1: Secondary | ICD-10-CM | POA: Diagnosis present

## 2024-04-11 DIAGNOSIS — F411 Generalized anxiety disorder: Secondary | ICD-10-CM | POA: Diagnosis present

## 2024-04-11 DIAGNOSIS — R791 Abnormal coagulation profile: Secondary | ICD-10-CM | POA: Diagnosis present

## 2024-04-11 DIAGNOSIS — Z96649 Presence of unspecified artificial hip joint: Secondary | ICD-10-CM

## 2024-04-11 DIAGNOSIS — W07XXXA Fall from chair, initial encounter: Secondary | ICD-10-CM | POA: Diagnosis present

## 2024-04-11 DIAGNOSIS — I129 Hypertensive chronic kidney disease with stage 1 through stage 4 chronic kidney disease, or unspecified chronic kidney disease: Secondary | ICD-10-CM | POA: Diagnosis present

## 2024-04-11 DIAGNOSIS — E785 Hyperlipidemia, unspecified: Secondary | ICD-10-CM | POA: Diagnosis present

## 2024-04-11 DIAGNOSIS — Z8249 Family history of ischemic heart disease and other diseases of the circulatory system: Secondary | ICD-10-CM

## 2024-04-11 DIAGNOSIS — Z6831 Body mass index (BMI) 31.0-31.9, adult: Secondary | ICD-10-CM

## 2024-04-11 DIAGNOSIS — M978XXA Periprosthetic fracture around other internal prosthetic joint, initial encounter: Secondary | ICD-10-CM | POA: Diagnosis not present

## 2024-04-11 DIAGNOSIS — M9702XA Periprosthetic fracture around internal prosthetic left hip joint, initial encounter: Secondary | ICD-10-CM | POA: Diagnosis present

## 2024-04-11 DIAGNOSIS — D649 Anemia, unspecified: Secondary | ICD-10-CM | POA: Diagnosis present

## 2024-04-11 DIAGNOSIS — F32A Depression, unspecified: Secondary | ICD-10-CM | POA: Diagnosis present

## 2024-04-11 DIAGNOSIS — E11319 Type 2 diabetes mellitus with unspecified diabetic retinopathy without macular edema: Secondary | ICD-10-CM | POA: Diagnosis present

## 2024-04-11 DIAGNOSIS — Z961 Presence of intraocular lens: Secondary | ICD-10-CM | POA: Diagnosis present

## 2024-04-11 DIAGNOSIS — D731 Hypersplenism: Secondary | ICD-10-CM | POA: Diagnosis present

## 2024-04-11 DIAGNOSIS — D61818 Other pancytopenia: Secondary | ICD-10-CM | POA: Diagnosis present

## 2024-04-11 DIAGNOSIS — E559 Vitamin D deficiency, unspecified: Secondary | ICD-10-CM | POA: Diagnosis present

## 2024-04-11 DIAGNOSIS — H9190 Unspecified hearing loss, unspecified ear: Secondary | ICD-10-CM | POA: Diagnosis present

## 2024-04-11 DIAGNOSIS — Z9841 Cataract extraction status, right eye: Secondary | ICD-10-CM

## 2024-04-11 DIAGNOSIS — M810 Age-related osteoporosis without current pathological fracture: Secondary | ICD-10-CM | POA: Diagnosis present

## 2024-04-11 LAB — BASIC METABOLIC PANEL WITH GFR
Anion gap: 9 (ref 5–15)
BUN: 39 mg/dL — ABNORMAL HIGH (ref 8–23)
CO2: 25 mmol/L (ref 22–32)
Calcium: 8.6 mg/dL — ABNORMAL LOW (ref 8.9–10.3)
Chloride: 102 mmol/L (ref 98–111)
Creatinine, Ser: 1.4 mg/dL — ABNORMAL HIGH (ref 0.61–1.24)
GFR, Estimated: 52 mL/min — ABNORMAL LOW
Glucose, Bld: 197 mg/dL — ABNORMAL HIGH (ref 70–99)
Potassium: 4.9 mmol/L (ref 3.5–5.1)
Sodium: 136 mmol/L (ref 135–145)

## 2024-04-11 LAB — CBC WITH DIFFERENTIAL/PLATELET
Abs Immature Granulocytes: 0.07 K/uL (ref 0.00–0.07)
Basophils Absolute: 0 K/uL (ref 0.0–0.1)
Basophils Relative: 0 %
Eosinophils Absolute: 0.1 K/uL (ref 0.0–0.5)
Eosinophils Relative: 2 %
HCT: 24.2 % — ABNORMAL LOW (ref 39.0–52.0)
Hemoglobin: 8 g/dL — ABNORMAL LOW (ref 13.0–17.0)
Immature Granulocytes: 1 %
Lymphocytes Relative: 13 %
Lymphs Abs: 0.7 K/uL (ref 0.7–4.0)
MCH: 30.1 pg (ref 26.0–34.0)
MCHC: 33.1 g/dL (ref 30.0–36.0)
MCV: 91 fL (ref 80.0–100.0)
Monocytes Absolute: 0.4 K/uL (ref 0.1–1.0)
Monocytes Relative: 7 %
Neutro Abs: 4.1 K/uL (ref 1.7–7.7)
Neutrophils Relative %: 77 %
Platelets: 94 K/uL — ABNORMAL LOW (ref 150–400)
RBC: 2.66 MIL/uL — ABNORMAL LOW (ref 4.22–5.81)
RDW: 18.4 % — ABNORMAL HIGH (ref 11.5–15.5)
WBC: 5.4 K/uL (ref 4.0–10.5)
nRBC: 0 % (ref 0.0–0.2)

## 2024-04-11 LAB — PROTIME-INR
INR: 1 (ref 0.8–1.2)
Prothrombin Time: 13.6 s (ref 11.4–15.2)

## 2024-04-11 MED ORDER — ONDANSETRON HCL 4 MG/2ML IJ SOLN: 4.0000 mg | Freq: Four times a day (QID) | INTRAMUSCULAR | Status: DC | PRN

## 2024-04-11 MED ORDER — HYDROMORPHONE HCL 1 MG/ML IJ SOLN
0.5000 mg | INTRAMUSCULAR | Status: DC | PRN
  Administered 2024-04-12 – 2024-04-17 (×13): 0.5 mg via INTRAVENOUS
  Filled 2024-04-11 (×10): qty 0.5
  Filled 2024-04-11: qty 1
  Filled 2024-04-11 (×2): qty 0.5

## 2024-04-11 MED ORDER — MORPHINE SULFATE (PF) 4 MG/ML IV SOLN
4.0000 mg | Freq: Once | INTRAVENOUS | Status: AC
  Administered 2024-04-11: 4 mg via INTRAVENOUS
  Filled 2024-04-11: qty 1

## 2024-04-11 MED ORDER — MORPHINE SULFATE (PF) 4 MG/ML IV SOLN
4.0000 mg | INTRAVENOUS | Status: DC | PRN
  Administered 2024-04-11: 4 mg via INTRAVENOUS
  Filled 2024-04-11: qty 1

## 2024-04-11 MED ORDER — ACETAMINOPHEN 325 MG PO TABS
650.0000 mg | ORAL_TABLET | Freq: Four times a day (QID) | ORAL | Status: DC | PRN
  Administered 2024-04-17 – 2024-04-22 (×3): 650 mg via ORAL
  Filled 2024-04-11: qty 2

## 2024-04-11 MED ORDER — ONDANSETRON HCL 4 MG/2ML IJ SOLN
4.0000 mg | Freq: Once | INTRAMUSCULAR | Status: AC
  Administered 2024-04-11: 4 mg via INTRAVENOUS
  Filled 2024-04-11: qty 2

## 2024-04-11 MED ORDER — HYDROCODONE-ACETAMINOPHEN 5-325 MG PO TABS
1.0000 | ORAL_TABLET | Freq: Four times a day (QID) | ORAL | Status: DC | PRN
  Administered 2024-04-12: 1 via ORAL
  Administered 2024-04-13 – 2024-04-15 (×4): 2 via ORAL
  Filled 2024-04-11: qty 1
  Filled 2024-04-11 (×5): qty 2

## 2024-04-11 MED ORDER — SENNOSIDES-DOCUSATE SODIUM 8.6-50 MG PO TABS: 1.0000 | ORAL_TABLET | Freq: Every evening | ORAL | Status: AC | PRN

## 2024-04-11 NOTE — H&P (Signed)
 " History and Physical    Dennis Wise FMW:[id number] DOB: 01-31-1951 DOA: 04/11/2024  PCP: Dennis Arlyss RAMAN, MD  Patient coming from: Home  I have personally briefly reviewed patient's old medical records in [facility]  Chief Complaint: Left hip pain after fall  HPI: Dennis Wise is a 73 y.o. male with medical history significant for CVA on aspirin /Plavix , anemia of chronic disease and iron  deficiency, chronic thrombocytopenia, CKD stage IIIa, HTN, HLD, T2DM, GAD, s/p left elbow ORIF 02/08/2024 and left THA 02/10/2024 who presented to the ED for evaluation of left hip pain after fall at home.  ***  ED Course  Labs/Imaging on admission: I have personally reviewed following labs and imaging studies.  Initial vitals showed BP 177/123, pulse 69, RR 14, temp 97.8 F, SpO2 97% on room air.  Labs showed WBC 5.4, hemoglobin 8.0, platelets 94, sodium 136, potassium 4.9, bicarb 25, BUN 39, creatinine 1.40, serum glucose 197.  Left hip x-ray showed mildly displaced oblique/spiral periprosthetic fracture about the femoral stem.  Left knee x-ray again showed periprosthetic fracture about the femoral stem of hip arthroplasty, partially included.  Surgical fixation of the patella, one of the K wires is broken.  Patient was given IV morphine  4 mg x 2, IV Zofran .  EDP discussed with on-call orthopedics covering for Dr. Kendal Lauraine Moores, PA, who recommended medical admission, n.p.o. at midnight for possible surgical fixation tomorrow 04/12/2024.  The hospitalist service was consulted for admission.  Review of Systems: All systems reviewed and are negative except as documented in history of present illness above.   Past Medical History:  Diagnosis Date   Actinic keratosis 10/16/2013   Bilateral carotid artery stenosis 09/23/2021   Less than 50% bilaterally 2023   Cervical stenosis of spinal canal    Combined form of senile cataract of both eyes 05/02/2016   Diabetic retinopathy  03/21/2005   Dupuytren contracture 03/03/2019   Essential hypertension 11/20/1999   Last Assessment & Plan: Change to 5mg  ramipril  and see if the lightheaded troubles get better.  He'll update me as needed.   [name]    Generalized anxiety disorder 10/01/2022   GERD (gastroesophageal reflux disease) 01/1989   Hearing loss 10/20/2014   Helicobacter pylori gastritis 06/11/2011   On EGD 05/2011    Hemorrhagic disorder due to circulating anticoagulants 09/27/2022   History of colonic polyps 06/05/2012   06/05/2011 - 12 polyps max 7 mm, at least 9 adenomas  04/07/2013 - no polyps - repeat colonoscopy 2019 Dennis CHARLENA Commander, MD, [facility]        IMO SNOMED Dx Update Oct 2024     Hyperlipidemia    Internal hemorrhoids    Iron  deficiency anemia, unspecified 03/04/2011   Ischemic right PCA stroke 09/05/2022   Left homonymous hemianopsia 08/2022   Caused by right PCA stroke   MGUS (monoclonal gammopathy of unknown significance)    possible dx initially, had negative f/u   Mild vascular neurocognitive disorder 05/07/2023   Monoclonal B-cell lymphocytosis 01/15/2022   NAFLD (nonalcoholic fatty liver disease)    Nocturia 08/18/2021   Normocytic anemia 04/03/2021   Obesity (BMI 30-39.9) 10/16/2013   Pain in joint of left knee 09/27/2022   Pancytopenia, acquired 05/01/2011   Pneumonia    Pseudophakia, both eyes 07/17/2016   Pure hypercholesterolemia 04/21/1988   Last Assessment & Plan: Continue statin, continue work on diet, d/w pt about labs.  He agrees.   Shoulder pain 07/02/2022   Syncope 11/22/2021  Thrombocytopenia 05/03/2021   Tibial fracture 10/24/2010   L tibia.  Repair 2012.    L tibia.  Repair 2012.  Last Assessment & Plan:  Per ortho.     Type II diabetes mellitus 04/21/1988   With h/o dec in sensation on feet     Unstable ankle 09/12/2020    Past Surgical History:  Procedure Laterality Date   CATARACT EXTRACTION Bilateral    CERVICAL FUSION  01/05/2024   C2-T2    COLONOSCOPY     ESOPHAGOGASTRODUODENOSCOPY     2012 H. pylori gastritis   FLEXIBLE SIGMOIDOSCOPY  05/1988   internal hemorrhoids   KNEE SURGERY  12/2002   lt knee fx repair ORIF   ORIF ELBOW FRACTURE Left 02/08/2024   Procedure: OPEN REDUCTION INTERNAL FIXATION (ORIF) ELBOW/OLECRANON FRACTURE;  Surgeon: Celena Sharper, MD;  Location: [facility] OR;  Service: Orthopedics;  Laterality: Left;   TIBIA FRACTURE SURGERY     left 2012   TOTAL HIP ARTHROPLASTY Left 02/10/2024   Procedure: ARTHROPLASTY, HIP, TOTAL,POSTERIOR APPROACH;  Surgeon: Edna Toribio LABOR, MD;  Location: [facility] ORS;  Service: Orthopedics;  Laterality: Left;    Social History: Social History[1]  Allergies[2]  Family History  Problem Relation Age of Onset   Cancer Mother        ?   Diabetes Mother    Heart failure Mother    Emphysema Father    Cancer Father        ?   Alzheimer's disease Father    Dementia Father    Skin cancer Brother    Diabetes Brother    Colon cancer Neg Hx    Prostate cancer Neg Hx    Esophageal cancer Neg Hx    Rectal cancer Neg Hx    Stomach cancer Neg Hx      Prior to Admission medications  Medication Sig Start Date End Date Taking? Authorizing Provider  acetaminophen  (TYLENOL ) 325 MG tablet Take 2 tablets (650 mg total) by mouth every 4 (four) hours as needed for mild pain (or temp > 37.5 C (99.5 F)). 09/07/22   Sheikh, Omair Latif, DO  atorvastatin  (LIPITOR) 10 MG tablet Take 1 tablet (10 mg total) by mouth at bedtime. 02/26/23   Dennis Arlyss RAMAN, MD  cholecalciferol  (CHOLECALCIFEROL ) 25 MCG tablet Take 2 tablets (2,000 Units total) by mouth daily. 02/18/24   Jillian Buttery, MD  clopidogrel  (PLAVIX ) 75 MG tablet TAKE 1 TABLET EVERY DAY 09/17/23   Dennis Arlyss RAMAN, MD  docusate sodium  (COLACE) 100 MG capsule Take 1 capsule (100 mg total) by mouth 2 (two) times daily. 02/17/24   Jillian Buttery, MD  escitalopram  (LEXAPRO ) 10 MG tablet Take 1 tablet (10 mg total) by mouth daily. 09/09/23    Saucier, Dorn Ruth, NP  glimepiride  (AMARYL ) 2 MG tablet Take 1 tablet (2 mg total) by mouth daily with breakfast. 03/24/24   Dennis Arlyss RAMAN, MD  hydrochlorothiazide  (HYDRODIURIL ) 25 MG tablet Take 0.5 tablets (12.5 mg total) by mouth daily. 03/24/24   Dennis Arlyss RAMAN, MD  IRON -VITAMIN C  PO Take 1 tablet by mouth in the morning.    [provider]  losartan  (COZAAR ) 100 MG tablet TAKE 1 TABLET EVERY DAY 09/17/23   Dennis Arlyss RAMAN, MD  memantine  (NAMENDA ) 5 MG tablet Take 1 tablet (5 mg total) by mouth 2 (two) times daily. 03/23/23   Wertman, Sara E, PA-C  metFORMIN  (GLUCOPHAGE ) 850 MG tablet Take 1 tablet (850 mg total) by mouth 2 (two) times daily. 02/26/23  Dennis Arlyss RAMAN, MD  Multiple Vitamin (MULTIVITAMIN) tablet Take 1 tablet by mouth at bedtime. Centrum Silver    [provider]  niacin  (VITAMIN B3) 500 MG ER tablet Take 1 tablet (500 mg total) by mouth 2 (two) times daily. 03/24/24   Dennis Arlyss RAMAN, MD  pioglitazone  (ACTOS ) 45 MG tablet Take 1 tablet (45 mg total) by mouth daily. 02/26/23   Dennis Arlyss RAMAN, MD    Physical Exam: Vitals:   04/11/24 2033 04/11/24 2034 04/11/24 2230 04/11/24 2245  BP:   (!) 137/53 (!) 139/44  Pulse:   60 (!) 59  Resp:   (!) 8 (!) 3  Temp: 97.8 F (36.6 C)     TempSrc: Oral     SpO2:   100% 99%  Weight:  122.5 kg    Height:  6' 2 (1.88 m)     Constitutional: Resting supine in bed, NAD, calm, comfortable Eyes: EOMI, lids and conjunctivae normal ENMT: Mucous membranes are moist. Posterior pharynx clear of any exudate or lesions.Normal dentition.  Neck: normal, supple, no masses. Respiratory: clear to auscultation anteriorly. Normal respiratory effort. No accessory muscle use.  Cardiovascular: Regular rate and rhythm, systolic murmur.  Trace lower extremity edema. 2+ pedal pulses. Abdomen: no tenderness, no masses palpated. Musculoskeletal: Left lower extremity shortened, left foot everted, left hip externally rotated Skin: no  rashes, lesions, ulcers. No induration Neurologic: Sensation intact. Strength limited LLE due to hip fracture otherwise 5/5 in all other extremities Psychiatric: Normal judgment and insight. Alert and oriented x 3. Normal mood.   EKG: Personally reviewed. ***  Assessment/Plan Principal Problem:   Periprosthetic hip fracture, initial encounter Active Problems:   Essential hypertension   Thrombocytopenia   IDA (iron  deficiency anemia)   Generalized anxiety disorder   DM type 2 with diabetic background retinopathy (HCC)   Chronic kidney disease, stage 3a (HCC)   Dyslipidemia   History of stroke   *** No notes on file *** Assessment and Plan: Acute left periprosthetic hip fracture: ***  Anemia of chronic disease and iron  deficiency: ***  Chronic thrombocytopenia: ***  CKD stage IIIa: ***  History of CVA: ***  Type 2 diabetes: ***  Hypertension: ***  Hyperlipidemia: ***  GAD: ***    DVT prophylaxis: SCDs Start: 04/11/24 2344 Code Status: Full code, confirmed with patient on admission Family Communication: Spouse at bedside Disposition Plan: From home, dispo pending clinical progress Consults called: Orthopedics Severity of Illness: The appropriate patient status for this patient is INPATIENT. Inpatient status is judged to be reasonable and necessary in order to provide the required intensity of service to ensure the patient's safety. The patient's presenting symptoms, physical exam findings, and initial radiographic and laboratory data in the context of their chronic comorbidities is felt to place them at high risk for further clinical deterioration. Furthermore, it is not anticipated that the patient will be medically stable for discharge from the hospital within 2 midnights of admission.   * I certify that at the point of admission it is my clinical judgment that the patient will require inpatient hospital care spanning beyond 2 midnights from the point of  admission due to high intensity of service, high risk for further deterioration and high frequency of surveillance required.DEWAINE Jorie Blanch MD [facility]  If 7PM-7AM, please contact night-coverage [email or web address]  04/11/2024, 11:46 PM      [1]  Social History Tobacco Use   Smoking status: Never   Smokeless tobacco: Never  Vaping Use  Vaping status: Never Used  Substance Use Topics   Alcohol  use: Never   Drug use: No  [2]  Allergies Allergen Reactions   Hydralazine      Short of breath.     Promethazine Hcl     REACTION: AGITIATION   "

## 2024-04-11 NOTE — Progress Notes (Signed)
 I was contacted regarding this patient for a left periprosthetic hip fracture. Patient sustained a fall earlier today, landing on his hip. Patient being admitted to hospitalist service. Formal ortho consult to follow in the AM. Please keep NPO at midnight for possible surgery 04/12/24

## 2024-04-11 NOTE — ED Triage Notes (Signed)
 Pt BIB [facility] from home c/o witnessed fall. Pt was reaching down and fell over on left side. Denies hitting head or LOC. Pt c/o L shoulder and hip pain. Pt recently had L hip replacement surgery in October. Per EMS, L elbow is still broken. Upon assessment, pt has small tear on L knee. Pt on Plavix .  200mcg Fentanyl  given en route 186/72 HR 72 98% 2L

## 2024-04-11 NOTE — ED Provider Notes (Signed)
 "  Emergency Department Provider Note   I have reviewed the triage vital signs and the nursing notes.   HISTORY  Chief Complaint Fall   HPI Dennis Wise is a 73 y.o. male who presents to the emergency department for evaluation left thigh pain after fall.  Patient was at home in his recliner and leaned forward to get the remote when he fell out of the chair.  He did not strike his head or lose consciousness.  He notes severe pain in his left thigh.  He recently had that hip repaired with Dr. Celena. No numbness/weakness. No CP.   Past Medical History:  Diagnosis Date   Actinic keratosis 10/16/2013   Bilateral carotid artery stenosis 09/23/2021   Less than 50% bilaterally 2023   Cervical stenosis of spinal canal    Combined form of senile cataract of both eyes 05/02/2016   Diabetic retinopathy 03/21/2005   Dupuytren contracture 03/03/2019   Essential hypertension 11/20/1999   Last Assessment & Plan: Change to 5mg  ramipril  and see if the lightheaded troubles get better.  He'll update me as needed.   [name]    Generalized anxiety disorder 10/01/2022   GERD (gastroesophageal reflux disease) 01/1989   Hearing loss 10/20/2014   Helicobacter pylori gastritis 06/11/2011   On EGD 05/2011    Hemorrhagic disorder due to circulating anticoagulants 09/27/2022   History of colonic polyps 06/05/2012   06/05/2011 - 12 polyps max 7 mm, at least 9 adenomas  04/07/2013 - no polyps - repeat colonoscopy 2019 [name], [facility], [facility]        IMO SNOMED Dx Update Oct 2024     Hyperlipidemia    Internal hemorrhoids    Iron  deficiency anemia, unspecified 03/04/2011   Ischemic right PCA stroke 09/05/2022   Left homonymous hemianopsia 08/2022   Caused by right PCA stroke   MGUS (monoclonal gammopathy of unknown significance)    possible dx initially, had negative f/u   Mild vascular neurocognitive disorder 05/07/2023   Monoclonal B-cell lymphocytosis 01/15/2022   NAFLD (nonalcoholic fatty liver  disease)    Nocturia 08/18/2021   Normocytic anemia 04/03/2021   Obesity (BMI 30-39.9) 10/16/2013   Pain in joint of left knee 09/27/2022   Pancytopenia, acquired 05/01/2011   Pneumonia    Pseudophakia, both eyes 07/17/2016   Pure hypercholesterolemia 04/21/1988   Last Assessment & Plan: Continue statin, continue work on diet, d/w pt about labs.  He agrees.   Shoulder pain 07/02/2022   Syncope 11/22/2021   Thrombocytopenia 05/03/2021   Tibial fracture 10/24/2010   L tibia.  Repair 2012.    L tibia.  Repair 2012.  Last Assessment & Plan:  Per ortho.     Type II diabetes mellitus 04/21/1988   With h/o dec in sensation on feet     Unstable ankle 09/12/2020    Review of Systems  Constitutional: No fever/chills Cardiovascular: Denies chest pain. Respiratory: Denies shortness of breath. Gastrointestinal: No abdominal pain.  No nausea, no vomiting.  Musculoskeletal: Positive left thigh pain.  Skin: Negative for rash. Neurological: Negative for headaches, focal weakness or numbness.  ____________________________________________   PHYSICAL EXAM:  VITAL SIGNS: ED Triage Vitals  Encounter Vitals Group     BP 04/11/24 2032 (!) 177/123     Pulse Rate 04/11/24 2032 75     Resp 04/11/24 2032 14     Temp 04/11/24 2033 97.8 F (36.6 C)     Temp Source 04/11/24 2033 Oral  SpO2 04/11/24 2032 97 %     Weight 04/11/24 2034 270 lb (122.5 kg)     Height 04/11/24 2034 6' 2 (1.88 m)   Constitutional: Alert and oriented. Well appearing and in no acute distress. Eyes: Conjunctivae are normal. Head: Atraumatic. Nose: No congestion/rhinnorhea. Mouth/Throat: Mucous membranes are moist.   Neck: No stridor.   Cardiovascular: Normal rate, regular rhythm. Good peripheral circulation. Grossly normal heart sounds.   Respiratory: Normal respiratory effort.  No retractions. Lungs CTAB. Gastrointestinal: Soft and nontender. No distention.  Musculoskeletal: Left lower extremity is shortened  and externally rotated.  Tenderness over the proximal thigh without visible bruising.  Compartments are soft. Neurologic:  Normal speech and language. No gross focal neurologic deficits are appreciated.  Skin:  Skin is warm, dry and intact. No rash noted.   ____________________________________________   LABS (all labs ordered are listed, but only abnormal results are displayed)  Labs Reviewed  BASIC METABOLIC PANEL WITH GFR - Abnormal; Notable for the following components:      Result Value   Glucose, Bld 197 (*)    BUN 39 (*)    Creatinine, Ser 1.40 (*)    Calcium  8.6 (*)    GFR, Estimated 52 (*)    All other components within normal limits  CBC WITH DIFFERENTIAL/PLATELET - Abnormal; Notable for the following components:   RBC 2.66 (*)    Hemoglobin 8.0 (*)    HCT 24.2 (*)    RDW 18.4 (*)    Platelets 94 (*)    All other components within normal limits  PROTIME-INR   ____________________________________________  RADIOLOGY  DG Hip Unilat W or Wo Pelvis 2-3 Views Left Result Date: 04/11/2024 CLINICAL DATA:  Status post fall, left hip pain. EXAM: DG HIP (WITH OR WITHOUT PELVIS) 2-3V LEFT COMPARISON:  None Available. FINDINGS: Left hip arthroplasty in place. Mildly displaced oblique/spiral fracture about the femoral stem. The femoral component is seated in the acetabular component. No pelvic fracture. Pubic rami are intact. IMPRESSION: Mildly displaced oblique/spiral periprosthetic fracture about the femoral stem. Electronically Signed   By: Andrea Gasman M.D.   On: 04/11/2024 21:36    ____________________________________________   PROCEDURES  Procedure(s) performed:   Procedures  None  ____________________________________________   INITIAL IMPRESSION / ASSESSMENT AND PLAN / ED COURSE  Pertinent labs & imaging results that were available during my care of the patient were reviewed by me and considered in my medical decision making (see chart for details).   This  patient is Presenting for Evaluation of fall, which does require a range of treatment options, and is a complaint that involves a high risk of morbidity and mortality.  The Differential Diagnoses include hip dislocation, periprosthetic fracture, contusion, etc.  Critical Interventions-    Medications  morphine  (PF) 4 MG/ML injection 4 mg (4 mg Intravenous Given 04/11/24 2051)  ondansetron  (ZOFRAN ) injection 4 mg (4 mg Intravenous Given 04/11/24 2050)    Reassessment after intervention: pain improved.   Clinical Laboratory Tests Ordered, included CBC without leukocytosis.  Anemia to 8.0.  Creatinine 1.4.  INR normal.  Radiologic Tests Ordered, included hip XR. I independently interpreted the images and agree with radiology interpretation.   Cardiac Monitor Tracing which shows NSR.    Social Determinants of Health Risk patient is a non-smoker.   Consult complete with Ortho on call for Dr. Celena Teddi Moores).  Ortho team will round on this patient in the morning.   Medical Decision Making: Summary: The patient presents emergency department  with hip pain after fall today.  Suspect either periprosthetic fracture or hip dislocation.  Imaging pending.   Reevaluation with update and discussion with patient.  He has a periprosthetic fracture of the left femur.  Pain well-controlled at rest.  Will reach out to Ortho on-call to coordinate repair.  ***Considered admission***  Patient's presentation is most consistent with acute presentation with potential threat to life or bodily function.   Disposition:   ____________________________________________  FINAL CLINICAL IMPRESSION(S) / ED DIAGNOSES  Final diagnoses:  None     NEW OUTPATIENT MEDICATIONS STARTED DURING THIS VISIT:  New Prescriptions   No medications on file    Note:  This document was prepared using Dragon voice recognition software and may include unintentional dictation errors.  Fonda Law, MD,  [facility]  "

## 2024-04-12 ENCOUNTER — Inpatient Hospital Stay (HOSPITAL_COMMUNITY): Admitting: Anesthesiology

## 2024-04-12 ENCOUNTER — Inpatient Hospital Stay (HOSPITAL_COMMUNITY)

## 2024-04-12 ENCOUNTER — Encounter (HOSPITAL_COMMUNITY)
Admission: EM | Disposition: A | Discharge status: Skilled Nursing Facility | Payer: Self-pay | Source: Home / Self Care | Attending: Internal Medicine

## 2024-04-12 ENCOUNTER — Encounter (HOSPITAL_COMMUNITY): Payer: Self-pay | Admitting: Internal Medicine

## 2024-04-12 DIAGNOSIS — N1831 Chronic kidney disease, stage 3a: Secondary | ICD-10-CM

## 2024-04-12 DIAGNOSIS — M9702XA Periprosthetic fracture around internal prosthetic left hip joint, initial encounter: Secondary | ICD-10-CM

## 2024-04-12 DIAGNOSIS — E1122 Type 2 diabetes mellitus with diabetic chronic kidney disease: Secondary | ICD-10-CM

## 2024-04-12 DIAGNOSIS — I129 Hypertensive chronic kidney disease with stage 1 through stage 4 chronic kidney disease, or unspecified chronic kidney disease: Secondary | ICD-10-CM

## 2024-04-12 HISTORY — PX: ORIF FEMUR FRACTURE: SHX2119

## 2024-04-12 LAB — CBC
HCT: 20.4 % — ABNORMAL LOW (ref 39.0–52.0)
HCT: 22.4 % — ABNORMAL LOW (ref 39.0–52.0)
HCT: 19.3 % — ABNORMAL LOW (ref 39.0–52.0)
Hemoglobin: 6.5 g/dL — CL (ref 13.0–17.0)
Hemoglobin: 7.8 g/dL — ABNORMAL LOW (ref 13.0–17.0)
Hemoglobin: 6.7 g/dL — CL (ref 13.0–17.0)
MCH: 29.8 pg (ref 26.0–34.0)
MCH: 31.1 pg (ref 26.0–34.0)
MCH: 30.6 pg (ref 26.0–34.0)
MCHC: 31.9 g/dL (ref 30.0–36.0)
MCHC: 34.8 g/dL (ref 30.0–36.0)
MCHC: 34.7 g/dL (ref 30.0–36.0)
MCV: 93.6 fL (ref 80.0–100.0)
MCV: 89.2 fL (ref 80.0–100.0)
MCV: 88.1 fL (ref 80.0–100.0)
Platelets: 85 K/uL — ABNORMAL LOW (ref 150–400)
Platelets: 100 K/uL — ABNORMAL LOW (ref 150–400)
Platelets: 85 K/uL — ABNORMAL LOW (ref 150–400)
RBC: 2.18 MIL/uL — ABNORMAL LOW (ref 4.22–5.81)
RBC: 2.51 MIL/uL — ABNORMAL LOW (ref 4.22–5.81)
RBC: 2.19 MIL/uL — ABNORMAL LOW (ref 4.22–5.81)
RDW: 18.2 % — ABNORMAL HIGH (ref 11.5–15.5)
RDW: 16.3 % — ABNORMAL HIGH (ref 11.5–15.5)
RDW: 16.7 % — ABNORMAL HIGH (ref 11.5–15.5)
WBC: 4.8 K/uL (ref 4.0–10.5)
WBC: 7.9 K/uL (ref 4.0–10.5)
WBC: 6.9 K/uL (ref 4.0–10.5)
nRBC: 0 % (ref 0.0–0.2)
nRBC: 0 % (ref 0.0–0.2)
nRBC: 0 % (ref 0.0–0.2)

## 2024-04-12 LAB — BASIC METABOLIC PANEL WITH GFR
Anion gap: 8 (ref 5–15)
Anion gap: 10 (ref 5–15)
BUN: 37 mg/dL — ABNORMAL HIGH (ref 8–23)
BUN: 38 mg/dL — ABNORMAL HIGH (ref 8–23)
CO2: 24 mmol/L (ref 22–32)
CO2: 23 mmol/L (ref 22–32)
Calcium: 8.4 mg/dL — ABNORMAL LOW (ref 8.9–10.3)
Calcium: 8 mg/dL — ABNORMAL LOW (ref 8.9–10.3)
Chloride: 105 mmol/L (ref 98–111)
Chloride: 105 mmol/L (ref 98–111)
Creatinine, Ser: 1.36 mg/dL — ABNORMAL HIGH (ref 0.61–1.24)
Creatinine, Ser: 1.58 mg/dL — ABNORMAL HIGH (ref 0.61–1.24)
GFR, Estimated: 54 mL/min — ABNORMAL LOW
GFR, Estimated: 45 mL/min — ABNORMAL LOW
Glucose, Bld: 240 mg/dL — ABNORMAL HIGH (ref 70–99)
Glucose, Bld: 250 mg/dL — ABNORMAL HIGH (ref 70–99)
Potassium: 4.9 mmol/L (ref 3.5–5.1)
Potassium: 5.2 mmol/L — ABNORMAL HIGH (ref 3.5–5.1)
Sodium: 137 mmol/L (ref 135–145)
Sodium: 138 mmol/L (ref 135–145)

## 2024-04-12 LAB — POCT I-STAT EG7
Acid-Base Excess: 0 mmol/L (ref 0.0–2.0)
Acid-base deficit: 3 mmol/L — ABNORMAL HIGH (ref 0.0–2.0)
Bicarbonate: 25.7 mmol/L (ref 20.0–28.0)
Bicarbonate: 23.5 mmol/L (ref 20.0–28.0)
Calcium, Ion: 1.24 mmol/L (ref 1.15–1.40)
Calcium, Ion: 1.16 mmol/L (ref 1.15–1.40)
HCT: 23 % — ABNORMAL LOW (ref 39.0–52.0)
HCT: 21 % — ABNORMAL LOW (ref 39.0–52.0)
Hemoglobin: 7.8 g/dL — ABNORMAL LOW (ref 13.0–17.0)
Hemoglobin: 7.1 g/dL — ABNORMAL LOW (ref 13.0–17.0)
O2 Saturation: 86 %
O2 Saturation: 69 %
Patient temperature: 36.5
Patient temperature: 36.9
Potassium: 4.5 mmol/L (ref 3.5–5.1)
Potassium: 4.3 mmol/L (ref 3.5–5.1)
Sodium: 138 mmol/L (ref 135–145)
Sodium: 138 mmol/L (ref 135–145)
TCO2: 27 mmol/L (ref 22–32)
TCO2: 25 mmol/L (ref 22–32)
pCO2, Ven: 47.8 mmHg (ref 44–60)
pCO2, Ven: 46 mmHg (ref 44–60)
pH, Ven: 7.337 (ref 7.25–7.43)
pH, Ven: 7.315 (ref 7.25–7.43)
pO2, Ven: 54 mmHg — ABNORMAL HIGH (ref 32–45)
pO2, Ven: 39 mmHg (ref 32–45)

## 2024-04-12 LAB — GLUCOSE, CAPILLARY
Glucose-Capillary: 180 mg/dL — ABNORMAL HIGH (ref 70–99)
Glucose-Capillary: 156 mg/dL — ABNORMAL HIGH (ref 70–99)
Glucose-Capillary: 131 mg/dL — ABNORMAL HIGH (ref 70–99)
Glucose-Capillary: 239 mg/dL — ABNORMAL HIGH (ref 70–99)
Glucose-Capillary: 215 mg/dL — ABNORMAL HIGH (ref 70–99)

## 2024-04-12 LAB — HEMOGLOBIN AND HEMATOCRIT, BLOOD
HCT: 21.6 % — ABNORMAL LOW (ref 39.0–52.0)
Hemoglobin: 7.2 g/dL — ABNORMAL LOW (ref 13.0–17.0)

## 2024-04-12 LAB — PREPARE RBC (CROSSMATCH)

## 2024-04-12 LAB — CBG MONITORING, ED: Glucose-Capillary: 175 mg/dL — ABNORMAL HIGH (ref 70–99)

## 2024-04-12 SURGERY — OPEN REDUCTION INTERNAL FIXATION FEMORAL SHAFT FRACTURE
Anesthesia: General | Site: Leg Upper | Laterality: Left

## 2024-04-12 MED ORDER — CEFAZOLIN SODIUM-DEXTROSE 2-4 GM/100ML-% IV SOLN
2.0000 g | Freq: Four times a day (QID) | INTRAVENOUS | Status: AC
  Administered 2024-04-13 (×2): 2 g via INTRAVENOUS
  Filled 2024-04-12 (×2): qty 100

## 2024-04-12 MED ORDER — VITAMIN D 25 MCG (1000 UNIT) PO TABS
2000.0000 [IU] | ORAL_TABLET | Freq: Two times a day (BID) | ORAL | Status: DC
  Administered 2024-04-13 – 2024-04-23 (×22): 2000 [IU] via ORAL
  Filled 2024-04-12 (×12): qty 2

## 2024-04-12 MED ORDER — FUROSEMIDE 10 MG/ML IJ SOLN
20.0000 mg | Freq: Once | INTRAMUSCULAR | Status: AC
  Administered 2024-04-13: 20 mg via INTRAVENOUS
  Filled 2024-04-12: qty 2

## 2024-04-12 MED ORDER — SODIUM CHLORIDE 0.9 % IV SOLN: INTRAVENOUS | Status: DC | PRN

## 2024-04-12 MED ORDER — AMISULPRIDE (ANTIEMETIC) 5 MG/2ML IV SOLN: 10.0000 mg | Freq: Once | INTRAVENOUS | Status: DC | PRN

## 2024-04-12 MED ORDER — LOSARTAN POTASSIUM 50 MG PO TABS
100.0000 mg | ORAL_TABLET | Freq: Every day | ORAL | Status: DC
  Administered 2024-04-12 – 2024-04-21 (×10): 100 mg via ORAL
  Filled 2024-04-12 (×6): qty 2

## 2024-04-12 MED ORDER — ORAL CARE MOUTH RINSE: 15.0000 mL | Freq: Once | OROMUCOSAL | Status: AC

## 2024-04-12 MED ORDER — ENOXAPARIN SODIUM 40 MG/0.4ML IJ SOSY
40.0000 mg | PREFILLED_SYRINGE | INTRAMUSCULAR | Status: DC
  Administered 2024-04-13: 40 mg via SUBCUTANEOUS
  Filled 2024-04-12: qty 0.4

## 2024-04-12 MED ORDER — ATORVASTATIN CALCIUM 10 MG PO TABS
10.0000 mg | ORAL_TABLET | Freq: Every day | ORAL | Status: DC
  Administered 2024-04-13 – 2024-04-22 (×10): 10 mg via ORAL
  Filled 2024-04-12 (×6): qty 1

## 2024-04-12 MED ORDER — MEMANTINE HCL 10 MG PO TABS
5.0000 mg | ORAL_TABLET | Freq: Two times a day (BID) | ORAL | Status: DC
  Administered 2024-04-12 – 2024-04-23 (×22): 5 mg via ORAL
  Filled 2024-04-12 (×14): qty 1

## 2024-04-12 MED ORDER — SODIUM CHLORIDE 0.9% IV SOLUTION: Freq: Once | INTRAVENOUS | Status: AC

## 2024-04-12 MED ORDER — OXYCODONE HCL 5 MG/5ML PO SOLN: 5.0000 mg | Freq: Once | ORAL | Status: DC | PRN

## 2024-04-12 MED ORDER — SODIUM CHLORIDE 0.9% IV SOLUTION: Freq: Once | INTRAVENOUS | Status: DC

## 2024-04-12 MED ORDER — PHENOL 1.4 % MT LIQD: 1.0000 | OROMUCOSAL | Status: DC | PRN

## 2024-04-12 MED ORDER — CHLORHEXIDINE GLUCONATE 0.12 % MT SOLN: 15.0000 mL | Freq: Once | OROMUCOSAL | Status: AC

## 2024-04-12 MED ORDER — FENTANYL CITRATE (PF) 100 MCG/2ML IJ SOLN
25.0000 ug | INTRAMUSCULAR | Status: DC | PRN
  Administered 2024-04-12: 25 ug via INTRAVENOUS

## 2024-04-12 MED ORDER — ROCURONIUM BROMIDE 10 MG/ML (PF) SYRINGE
PREFILLED_SYRINGE | INTRAVENOUS | Status: DC | PRN
  Administered 2024-04-12: 20 mg via INTRAVENOUS
  Administered 2024-04-12: 100 mg via INTRAVENOUS
  Administered 2024-04-12: 20 mg via INTRAVENOUS

## 2024-04-12 MED ORDER — TRANEXAMIC ACID-NACL 1000-0.7 MG/100ML-% IV SOLN
INTRAVENOUS | Status: DC | PRN
  Administered 2024-04-12: 1000 mg via INTRAVENOUS

## 2024-04-12 MED ORDER — METOCLOPRAMIDE HCL 5 MG/ML IJ SOLN: 5.0000 mg | Freq: Three times a day (TID) | INTRAMUSCULAR | Status: DC | PRN

## 2024-04-12 MED ORDER — VITAMIN C 500 MG PO TABS
1000.0000 mg | ORAL_TABLET | Freq: Every day | ORAL | Status: DC
  Administered 2024-04-13 – 2024-04-23 (×11): 1000 mg via ORAL
  Filled 2024-04-12 (×5): qty 2

## 2024-04-12 MED ORDER — INSULIN ASPART 100 UNIT/ML IJ SOLN
0.0000 [IU] | Freq: Three times a day (TID) | INTRAMUSCULAR | Status: DC
  Administered 2024-04-12 (×2): 2 [IU] via SUBCUTANEOUS
  Administered 2024-04-13: 5 [IU] via SUBCUTANEOUS
  Filled 2024-04-12 (×2): qty 2
  Filled 2024-04-12: qty 5

## 2024-04-12 MED ORDER — LIDOCAINE 2% (20 MG/ML) 5 ML SYRINGE
INTRAMUSCULAR | Status: DC | PRN
  Administered 2024-04-12: 60 mg via INTRAVENOUS

## 2024-04-12 MED ORDER — DEXAMETHASONE SOD PHOSPHATE PF 10 MG/ML IJ SOLN
INTRAMUSCULAR | Status: DC | PRN
  Administered 2024-04-12: 10 mg via INTRAVENOUS

## 2024-04-12 MED ORDER — ALBUMIN HUMAN 5 % IV SOLN
12.5000 g | Freq: Once | INTRAVENOUS | Status: AC
  Administered 2024-04-12: 12.5 g via INTRAVENOUS

## 2024-04-12 MED ORDER — FENTANYL CITRATE (PF) 250 MCG/5ML IJ SOLN
INTRAMUSCULAR | Status: DC | PRN
  Administered 2024-04-12: 50 ug via INTRAVENOUS

## 2024-04-12 MED ORDER — TAMSULOSIN HCL 0.4 MG PO CAPS
0.4000 mg | ORAL_CAPSULE | Freq: Every day | ORAL | Status: DC
  Administered 2024-04-12 – 2024-04-22 (×11): 0.4 mg via ORAL
  Filled 2024-04-12 (×7): qty 1

## 2024-04-12 MED ORDER — PROPOFOL 10 MG/ML IV BOLUS
INTRAVENOUS | Status: DC | PRN
  Administered 2024-04-12: 100 mg via INTRAVENOUS

## 2024-04-12 MED ORDER — ACETAMINOPHEN 325 MG PO TABS
650.0000 mg | ORAL_TABLET | Freq: Once | ORAL | Status: AC
  Administered 2024-04-13: 650 mg via ORAL
  Filled 2024-04-12: qty 2

## 2024-04-12 MED ORDER — LACTATED RINGERS IV SOLN: INTRAVENOUS | Status: DC

## 2024-04-12 MED ORDER — ONDANSETRON HCL 4 MG/2ML IJ SOLN
INTRAMUSCULAR | Status: AC
  Filled 2024-04-12: qty 2

## 2024-04-12 MED ORDER — CEFAZOLIN SODIUM-DEXTROSE 2-4 GM/100ML-% IV SOLN
2.0000 g | INTRAVENOUS | Status: AC
  Administered 2024-04-12 (×2): 2 g via INTRAVENOUS
  Filled 2024-04-12: qty 100

## 2024-04-12 MED ORDER — HYDROMORPHONE HCL 1 MG/ML IJ SOLN
INTRAMUSCULAR | Status: DC | PRN
  Administered 2024-04-12: .5 mg via INTRAVENOUS

## 2024-04-12 MED ORDER — FUROSEMIDE 10 MG/ML IJ SOLN
20.0000 mg | Freq: Once | INTRAMUSCULAR | Status: DC
  Filled 2024-04-12: qty 2

## 2024-04-12 MED ORDER — EPHEDRINE SULFATE-NACL 50-0.9 MG/10ML-% IV SOSY
PREFILLED_SYRINGE | INTRAVENOUS | Status: DC | PRN
  Administered 2024-04-12: 5 mg via INTRAVENOUS
  Administered 2024-04-12 (×2): 10 mg via INTRAVENOUS
  Administered 2024-04-12: 7.5 mg via INTRAVENOUS
  Administered 2024-04-12: 5 mg via INTRAVENOUS

## 2024-04-12 MED ORDER — OXYCODONE HCL 5 MG PO TABS: 5.0000 mg | ORAL_TABLET | Freq: Once | ORAL | Status: DC | PRN

## 2024-04-12 MED ORDER — THROMBIN 20000 UNITS EX KIT
PACK | CUTANEOUS | Status: AC
  Filled 2024-04-12: qty 1

## 2024-04-12 MED ORDER — MENTHOL 3 MG MT LOZG: 1.0000 | LOZENGE | OROMUCOSAL | Status: DC | PRN

## 2024-04-12 MED ORDER — METOCLOPRAMIDE HCL 5 MG PO TABS: 5.0000 mg | ORAL_TABLET | Freq: Three times a day (TID) | ORAL | Status: DC | PRN

## 2024-04-12 MED ORDER — CHLORHEXIDINE GLUCONATE 0.12 % MT SOLN
OROMUCOSAL | Status: AC
  Administered 2024-04-12: 15 mL via OROMUCOSAL
  Filled 2024-04-12: qty 15

## 2024-04-12 MED ORDER — DOCUSATE SODIUM 100 MG PO CAPS
100.0000 mg | ORAL_CAPSULE | Freq: Two times a day (BID) | ORAL | Status: DC
  Administered 2024-04-13 (×2): 100 mg via ORAL
  Filled 2024-04-12 (×4): qty 1

## 2024-04-12 MED ORDER — CEFAZOLIN SODIUM 1 G IJ SOLR
INTRAMUSCULAR | Status: AC
  Filled 2024-04-12: qty 20

## 2024-04-12 MED ORDER — HYDROCHLOROTHIAZIDE 12.5 MG PO TABS
12.5000 mg | ORAL_TABLET | Freq: Every day | ORAL | Status: DC
  Administered 2024-04-12 – 2024-04-15 (×4): 12.5 mg via ORAL
  Filled 2024-04-12 (×5): qty 1

## 2024-04-12 MED ORDER — PHENYLEPHRINE 80 MCG/ML (10ML) SYRINGE FOR IV PUSH (FOR BLOOD PRESSURE SUPPORT)
PREFILLED_SYRINGE | INTRAVENOUS | Status: DC | PRN
  Administered 2024-04-12: 80 ug via INTRAVENOUS

## 2024-04-12 MED ORDER — TRANEXAMIC ACID-NACL 1000-0.7 MG/100ML-% IV SOLN
1000.0000 mg | Freq: Once | INTRAVENOUS | Status: AC
  Administered 2024-04-13: 1000 mg via INTRAVENOUS
  Filled 2024-04-12: qty 100

## 2024-04-12 MED ORDER — FENTANYL CITRATE (PF) 250 MCG/5ML IJ SOLN
INTRAMUSCULAR | Status: AC
  Filled 2024-04-12: qty 5

## 2024-04-12 MED ORDER — ROCURONIUM BROMIDE 10 MG/ML (PF) SYRINGE
PREFILLED_SYRINGE | INTRAVENOUS | Status: AC
  Filled 2024-04-12: qty 10

## 2024-04-12 MED ORDER — ALBUMIN HUMAN 5 % IV SOLN
INTRAVENOUS | Status: AC
  Filled 2024-04-12: qty 250

## 2024-04-12 MED ORDER — ESCITALOPRAM OXALATE 10 MG PO TABS
10.0000 mg | ORAL_TABLET | Freq: Every day | ORAL | Status: DC
  Administered 2024-04-12 – 2024-04-23 (×12): 10 mg via ORAL
  Filled 2024-04-12 (×6): qty 1

## 2024-04-12 MED ORDER — THROMBIN 20000 UNITS EX KIT
PACK | CUTANEOUS | Status: DC | PRN
  Administered 2024-04-12: 20000 [IU] via TOPICAL

## 2024-04-12 MED ORDER — SUGAMMADEX SODIUM 200 MG/2ML IV SOLN
INTRAVENOUS | Status: AC
  Filled 2024-04-12: qty 2

## 2024-04-12 MED ORDER — VASOPRESSIN 20 UNIT/ML IV SOLN
INTRAVENOUS | Status: AC
  Filled 2024-04-12: qty 1

## 2024-04-12 MED ORDER — INSULIN ASPART 100 UNIT/ML IJ SOLN
0.0000 [IU] | INTRAMUSCULAR | Status: DC | PRN
  Administered 2024-04-12: 3 [IU] via SUBCUTANEOUS

## 2024-04-12 MED ORDER — PHENYLEPHRINE HCL-NACL 20-0.9 MG/250ML-% IV SOLN
INTRAVENOUS | Status: DC | PRN
  Administered 2024-04-12: 30 ug/min via INTRAVENOUS

## 2024-04-12 MED ORDER — 0.9 % SODIUM CHLORIDE (POUR BTL) OPTIME
TOPICAL | Status: DC | PRN
  Administered 2024-04-12 (×2): 1000 mL

## 2024-04-12 MED ORDER — VASOPRESSIN 20 UNIT/ML IV SOLN
INTRAVENOUS | Status: DC | PRN
  Administered 2024-04-12: 1 [IU] via INTRAVENOUS

## 2024-04-12 MED ORDER — PROPOFOL 10 MG/ML IV BOLUS
INTRAVENOUS | Status: AC
  Filled 2024-04-12: qty 20

## 2024-04-12 MED ORDER — ENSURE MAX PROTEIN PO LIQD
11.0000 [oz_av] | Freq: Two times a day (BID) | ORAL | Status: DC
  Administered 2024-04-13 – 2024-04-22 (×20): 11 [oz_av] via ORAL
  Filled 2024-04-12 (×12): qty 330

## 2024-04-12 MED ORDER — LIDOCAINE 2% (20 MG/ML) 5 ML SYRINGE
INTRAMUSCULAR | Status: AC
  Filled 2024-04-12: qty 5

## 2024-04-12 MED ORDER — HYDROMORPHONE HCL 1 MG/ML IJ SOLN
INTRAMUSCULAR | Status: AC
  Filled 2024-04-12: qty 0.5

## 2024-04-12 MED ORDER — CALCIUM CHLORIDE 10 % IV SOLN
INTRAVENOUS | Status: DC | PRN
  Administered 2024-04-12: 1 g via INTRAVENOUS

## 2024-04-12 MED ORDER — ONDANSETRON HCL 4 MG/2ML IJ SOLN
INTRAMUSCULAR | Status: DC | PRN
  Administered 2024-04-12: 4 mg via INTRAVENOUS

## 2024-04-12 MED ORDER — SUGAMMADEX SODIUM 200 MG/2ML IV SOLN
INTRAVENOUS | Status: DC | PRN
  Administered 2024-04-12: 200 mg via INTRAVENOUS

## 2024-04-12 MED ORDER — TRANEXAMIC ACID-NACL 1000-0.7 MG/100ML-% IV SOLN
INTRAVENOUS | Status: AC
  Filled 2024-04-12: qty 100

## 2024-04-12 MED ORDER — FENTANYL CITRATE (PF) 100 MCG/2ML IJ SOLN
INTRAMUSCULAR | Status: AC
  Filled 2024-04-12: qty 2

## 2024-04-12 SURGICAL SUPPLY — 56 items
BAG COUNTER SPONGE SURGICOUNT (BAG) ×1 IMPLANT
BIT DRILL QC LG EVOS 3.7 SN (DRILL) IMPLANT
BIT DRILL SURG QC LONG 3.7 (DRILL) IMPLANT
BRUSH SCRUB EZ 4% CHG (MISCELLANEOUS) ×1 IMPLANT
BRUSH SCRUB EZ PLAIN DRY (MISCELLANEOUS) ×2 IMPLANT
CANISTER WOUNDNEG PRESSURE 500 (CANNISTER) IMPLANT
COVER SURGICAL LIGHT HANDLE (MISCELLANEOUS) ×2 IMPLANT
DRAPE C-ARM 42X72 X-RAY (DRAPES) ×1 IMPLANT
DRAPE C-ARMOR (DRAPES) ×1 IMPLANT
DRAPE IMP U-DRAPE 54X76 (DRAPES) ×1 IMPLANT
DRAPE SURG ORHT 6 SPLT 77X108 (DRAPES) ×2 IMPLANT
DRAPE U-SHAPE 47X51 STRL (DRAPES) ×1 IMPLANT
DRESSING PREVENA PLUS CUSTOM (GAUZE/BANDAGES/DRESSINGS) IMPLANT
DRILL TARG QC EVOS 2.5 LG (DRILL) IMPLANT
DRSG ADAPTIC 3X8 NADH LF (GAUZE/BANDAGES/DRESSINGS) ×1 IMPLANT
ELECTRODE REM PT RTRN 9FT ADLT (ELECTROSURGICAL) ×1 IMPLANT
EVACUATOR 1/8 PVC DRAIN (DRAIN) IMPLANT
GAUZE PAD ABD 8X10 STRL (GAUZE/BANDAGES/DRESSINGS) ×4 IMPLANT
GAUZE SPONGE 4X4 12PLY STRL (GAUZE/BANDAGES/DRESSINGS) ×2 IMPLANT
GLOVE BIO SURGEON STRL SZ8 (GLOVE) ×2 IMPLANT
GLOVE BIOGEL PI IND STRL 7.5 (GLOVE) ×1 IMPLANT
GLOVE BIOGEL PI IND STRL 8 (GLOVE) ×1 IMPLANT
GLOVE SURG ORTHO LTX SZ7.5 (GLOVE) ×2 IMPLANT
GOWN STRL REUS W/ TWL LRG LVL3 (GOWN DISPOSABLE) ×2 IMPLANT
GOWN STRL REUS W/ TWL XL LVL3 (GOWN DISPOSABLE) ×1 IMPLANT
High Tork screw (Screw) IMPLANT
KIT BASIN OR (CUSTOM PROCEDURE TRAY) ×1 IMPLANT
KIT TURNOVER KIT B (KITS) ×1 IMPLANT
KWIRE FIX TT 2X350 (WIRE) IMPLANT
MANIFOLD NEPTUNE II (INSTRUMENTS) ×1 IMPLANT
PACK TOTAL JOINT (CUSTOM PROCEDURE TRAY) ×1 IMPLANT
PACK UNIVERSAL I (CUSTOM PROCEDURE TRAY) ×1 IMPLANT
PAD ARMBOARD POSITIONER FOAM (MISCELLANEOUS) ×2 IMPLANT
PASSER CERCLAGE OFFSET LT (ORTHOPEDIC DISPOSABLE SUPPLIES) IMPLANT
PLATE DIS EVOX 3.5X405 20H LT (Plate) IMPLANT
PeriProthestic Distal Femur Plate (Plate) IMPLANT
SCREW CORT ST EVOS 4.5X54 (Screw) IMPLANT
SCREW HIGH TORQ EVOS 6.7X95 (Screw) IMPLANT
SCREW LOCK EVOS BT 4.5X12 (Screw) IMPLANT
SCREW LOCK ST EVOS 4.5X30 (Screw) IMPLANT
SCREW LOCK ST EVOS 4.5X85 (Anchor) IMPLANT
SCREW LOCK ST EVOS 4.5X90 (Anchor) IMPLANT
SOLN 0.9% NACL POUR BTL 1000ML (IV SOLUTION) ×1 IMPLANT
SOLN STERILE WATER BTL 1000 ML (IV SOLUTION) ×3 IMPLANT
SPONGE T-LAP 18X18 ~~LOC~~+RFID (SPONGE) ×1 IMPLANT
STAPLER SKIN PROX 35W (STAPLE) ×1 IMPLANT
SUT ETHILON 2 0 FS 18 (SUTURE) IMPLANT
SUT VIC AB 0 CT1 27XBRD ANBCTR (SUTURE) ×1 IMPLANT
SUT VIC AB 1 CT1 18XCR BRD 8 (SUTURE) IMPLANT
SUT VIC AB 1 CT1 27XBRD ANTBC (SUTURE) ×4 IMPLANT
SUT VIC AB 1 CTX 18 (SUTURE) IMPLANT
SUT VIC AB 2-0 CT1 TAPERPNT 27 (SUTURE) ×1 IMPLANT
SUTURE TIGERTAPE CERCLG TGRLNK (SUTURE) IMPLANT
TOWEL GREEN STERILE (TOWEL DISPOSABLE) ×2 IMPLANT
TOWEL GREEN STERILE FF (TOWEL DISPOSABLE) ×1 IMPLANT
TRAY FOLEY MTR SLVR 16FR STAT (SET/KITS/TRAYS/PACK) IMPLANT

## 2024-04-12 NOTE — Hospital Course (Addendum)
 Dennis Wise is a 73 y.o. male with medical history significant for CVA on aspirin /Plavix , anemia of chronic disease and iron  deficiency, chronic thrombocytopenia, CKD stage IIIa, HTN, HLD, T2DM, GAD, s/p left elbow ORIF 02/08/2024 and left THA 02/10/2024 who is admitted for left periprosthetic hip fracture. 12/23: Seen by Dr. Celena status post left ORIF left femur shaft and large wound VAC placement  Subjective: Seen and examined today Alert awake resting, complains of pain on the left hip on any movement Overnight on 3 L [place] afebrile, VSS 120s systolic BP, Labs K slightly high 5.2-creatinine went from 1.5 hemoglobin dropped to 6.7 12/23 night platelet 85.  Patient had FFP x 1, platelet pheresis x 2, PRBC was transfused repeat lab came this morning hemoglobin up at 7.6 platelets 77  Assessment and plan:  Acute left periprosthetic hip fracture: 12/23: Seen by Dr. Celena status post left ORIF left femur shaft and large wound VAC placement Continue pain management DVT prophylaxis per surgery currently no anticoagulation or chemical DVT prophylaxis due to significant anemia  Acute on chronic anemia ABLA: Patient has received multiple blood transfusion, recheck H&H with improving 7.6, input appreciated and orthopedic team planning for additional 2 u PRBC 1 FFP and 1 platelet-along with IV Lasix  as ordered.  Watch for fluid overload. his CT left femur did mention suspected anterior thigh compartment muscle swelling with underlying hematoma not excluded, which could potentially be the source of acute anemia as well-no evidence of GI bleeding. Recent Labs  Lab 04/12/24 1446 04/12/24 1626 04/12/24 1933 04/12/24 2309 04/13/24 1024  HGB 7.8* 7.1* 7.8* 6.7* 7.6*  HCT 23.0* 21.0* 22.4* 19.3* 21.5*    Pancytopenia Chronic thrombocytopenia: MGUS bone marrow biopsy and aspiration that revealed 3% plasma cells in the bone marrow in [facility] thrombocytopenia was felt likely ITP previously dates as  far back as 2014. platelets down in 70s and getting additional 1 unit platelet in the setting of anemia Will ask Dr Autumn to eval. Recent Labs  Lab 04/11/24 2044 04/12/24 0344 04/12/24 1933 04/12/24 2309 04/13/24 1024  PLT 94* 85* 100* 85* 77*     CKD stage IIIa Mild hyperkalemia: Renal function relatively stable.  Continue to monitor.  Potassium slightly up but monitor Recent Labs    02/12/24 0552 02/13/24 0639 02/14/24 0613 02/15/24 0320 02/16/24 0544 02/17/24 0558 03/24/24 1514 04/11/24 2044 04/12/24 0344 04/12/24 1446 04/12/24 1626 04/12/24 2309  BUN 52* 54* 54* 54* 52* 48* 31* 39* 37*  --   --  38*  CREATININE 1.63* 1.48* 1.50* 1.47* 1.36* 1.34* 1.22 1.40* 1.36*  --   --  1.58*  CO2 24 23 22 23 23  20* 27 25 24   --   --  23  K 4.6 4.7 4.9 5.1 5.1 4.9 4.5 4.9 4.9 4.5 4.3 5.2*     History of CVA: Holding Plavix  in the setting of significant anemia-once okay with orthopedics and hemoglobin stable.   Type 2 diabetes: Poorly controlled hyperglycemia.PTA on glipizide metformin  Actos , holding p.o. meds.On SSI 0 to 15 units, add Semglee  10 units and novolog  2 u premeal  Recent Labs  Lab 04/12/24 1403 04/12/24 1920 04/12/24 2135 04/13/24 0807 04/13/24 1200  GLUCAP 131* 239* 215* 258* 197*    Hypertension: BP well-controlled.  Continue losartan  and HCTZ.   Hyperlipidemia: Continue atorvastatin .   Mild vascular neurocognitive disorder GAD: Stable, continue Lexapro  and Namenda .keep on  delirium precautions.  Class I Obesity w/ Body mass index is 31.19 kg/m.: Will benefit with  PCP follow-up, weight loss,healthy lifestyle and outpatient eval  Mobility: PT Orders: Active PT Follow up Rec: Skilled Nursing-Short Term Rehab (<3 Hours/Day)04/13/2024 [id number]   DVT prophylaxis: SCDs Start: 04/12/24 2251 SCDs Start: 04/11/24 2344 Code Status:   Code Status: Full Code Family Communication: plan of care discussed with patient at bedside. Patient status is: Remains  hospitalized because of severity of illness Level of care: Progressive > transfer to progressive  Dispo: The patient is from: home at baseline.            Anticipated disposition: TBD Objective: Vitals last 24 hrs: Vitals:   04/13/24 0400 04/13/24 0550 04/13/24 0715 04/13/24 1045  BP: (!) 128/44 (!) 123/44 (!) 122/39 (!) 116/49  Pulse: 73 70 69 67  Resp: 18 17 18 17   Temp: 98.6 F (37 C) 98 F (36.7 C) 98.6 F (37 C) 98.6 F (37 C)  TempSrc: Oral  Oral Oral  SpO2: 100% 100% 100% 100%  Weight:      Height:        Physical Examination: General exam: alert awake, obese ill-appearing HEENT:Oral mucosa moist, Ear/Nose WNL grossly Respiratory system: Bilaterally clear BS, on nasal cannula, no use of accessory muscle Cardiovascular system: S1 & S2 +, No JVD. Gastrointestinal system: Abdomen soft,NT,ND, BS+ Nervous System: Alert, awake, moving all extremities,and following commands. Extremities: extremities warm, leg edema + more on left hip w/ dressing in place, wound VAC in place with sanguinous discharge Skin: Warm, no rashes MSK: Normal muscle bulk,tone, power   Medications reviewed:  Scheduled Meds:  sodium chloride    Intravenous Once   sodium chloride    Intravenous Once   sodium chloride    Intravenous Once   sodium chloride    Intravenous Once   vitamin C   1,000 mg Oral Daily   atorvastatin   10 mg Oral QHS   cholecalciferol   2,000 Units Oral BID   docusate sodium   100 mg Oral BID   escitalopram   10 mg Oral Daily   furosemide   20 mg Intravenous Once   furosemide   20 mg Intravenous Once   furosemide   20 mg Intravenous Once   hydrochlorothiazide   12.5 mg Oral Daily   insulin  aspart  0-15 Units Subcutaneous TID WC   insulin  aspart  0-5 Units Subcutaneous QHS   insulin  aspart  2 Units Subcutaneous TID WC   insulin  glargine  10 Units Subcutaneous Q24H   losartan   100 mg Oral Daily   memantine   5 mg Oral BID   multivitamin with minerals  1 tablet Oral Daily   nutrition  supplement (JUVEN)  1 packet Oral BID BM   Ensure Max Protein  11 oz Oral BID BM   tamsulosin   0.4 mg Oral QHS   Continuous Infusions: Diet: Diet Order             Diet Carb Modified Room service appropriate? Yes  Diet effective now

## 2024-04-12 NOTE — ED Notes (Signed)
 Placed ice pack on left hip and placed pillow slightly under for pts comfort.

## 2024-04-12 NOTE — ED Notes (Signed)
 Blood consent form signed by pts wife. Pt consents to blood transfusion.

## 2024-04-12 NOTE — Progress Notes (Signed)
 Initial Nutrition Assessment  DOCUMENTATION CODES:   Obesity unspecified, Not applicable  INTERVENTION:   Encourage PO intake on carb modified diet Discussed increased protein intake for wound healing 1 packet Juven BID, each packet provides 95 calories, 2.5 grams of protein (collagen),  to support wound healing Ensure Max po BID, each supplement provides 150 kcal and 30 grams of protein.  Recommend checking Vitamin D  labs Continue 2000 units Vitamin D  BID daily, may have to adjust if Vitamin D  labs low  MVI with minerals daily   NUTRITION DIAGNOSIS:   Increased nutrient needs related to post-op healing as evidenced by estimated needs.   GOAL:   Patient will meet greater than or equal to 90% of their needs   MONITOR:   PO intake, Supplement acceptance, Labs, Weight trends, Skin  REASON FOR ASSESSMENT:   Consult Assessment of nutrition requirement/status, Hip fracture protocol  ASSESSMENT:   73 y.o. male with PMH of CVA, anemia of chronic disease and iron  deficiency, chronic thrombocytopenia, CKD stage IIIa, HTN, HLD, T2DM, GAD, GER, s/p left elbow ORIF 02/08/2024 and left THA 02/10/2024 who presented to the ED for evaluation of left hip pain after fall at home.  12/23 - S/P ORIF left hip   Pt in good spirits during assessment with wife at bedside, both were able to provide history.   Pt admitted 02/07/24-02/17/24 for closed fracture of left hip and left elbow s/p ORIF and THA, pt was discharged to SNF rehab and then was able to return home on November 26th. Was able to use a walker at discharge.  Wife reports pt's intake was great before he fell in October. Denies weight loss or eating concerns then. She does state that since his initial admission and while pt was in rehab he lost a significant amount of weight going from 260-270 lbs to 219 lbs when he returned home. Wife reports this was d/t pt not having an appetite and having nausea during his rehab stay however, she  reports pt has been doing very well since his return home and has gained back up to 230-240 lbs. If weight history is accurate pt has lost 12.5 lbs, 5% since October 2025. Not clinically significant.   Pt NPO yesterday for OR however pt repots he could eat everything and has a great appetite. Suspect pt will do well after surgery. Discussed increased protein needs after surgery for healing. Wife does endorse pt starting to get a sacral pressure wound, will add Juven to help with healing.   Dietary Recall: Breakfast:2 pieces of  toast with peanut butter and jelly Lunch: [place] butter and jelly sandwich Dinner: Chicken pot pie Drinks/Supplements: 1 premier protein shake per day, mocha coffee from [employer]  Snacks: Apples, crackers, and peanut butter  Admit weight: 122.5 kg - stated Current weight: 110.2 kg - bed weight      Wt Readings from Last 10 Encounters:  04/12/24 110.2 kg  03/24/24 101.8 kg  02/08/24 105.7 kg  02/04/24 105.7 kg  01/21/24 116.2 kg  12/09/23 118.4 kg  08/05/23 117.7 kg  05/29/23 117.6 kg  05/05/23 120 kg  04/24/23 116.7 kg   Nutritionally Relevant Medications: Scheduled Meds:  vitamin C   1,000 mg Oral Daily   cholecalciferol   2,000 Units Oral BID   escitalopram   10 mg Oral Daily   furosemide   20 mg Intravenous Once   insulin  aspart  0-9 Units Subcutaneous TID WC   multivitamin with minerals  1 tablet Oral Daily   nutrition supplement (  JUVEN)  1 packet Oral BID BM   Ensure Max Protein  11 oz Oral BID BM   Labs Reviewed: Potassium 5.2 BUN 38 Creatinine 1.58 GFR 45 CBG ranges from 215-258 mg/dL over the last 24 hours HgbA1c 5.6   NUTRITION - FOCUSED PHYSICAL EXAM:  Flowsheet Row Most Recent Value  Orbital Region No depletion  Upper Arm Region No depletion  Thoracic and Lumbar Region No depletion  Temple Region Mild depletion  Clavicle Bone Region Moderate depletion  Clavicle and Acromion Bone Region Mild depletion  Scapular Bone Region Mild  depletion  Dorsal Hand No depletion  Patellar Region No depletion  Anterior Thigh Region No depletion  Posterior Calf Region No depletion  Edema (RD Assessment) None  Hair Reviewed  Eyes Reviewed  Mouth Reviewed  Skin Reviewed  [Bruising on forearms]  Nails Reviewed    Diet Order:   Diet Order             Diet Carb Modified Room service appropriate? Yes  Diet effective now                   EDUCATION NEEDS:   Education needs have been addressed  Skin:  Skin Assessment: Skin Integrity Issues: Skin Integrity Issues:: Incisions Incisions: Closed surgical incision left leg  Last BM:  PTA  Height:   Ht Readings from Last 1 Encounters:  04/11/24 6' 2 (1.88 m)    Weight:   Wt Readings from Last 1 Encounters:  04/12/24 110.2 kg    Ideal Body Weight:  86.4 kg  BMI:  Body mass index is 31.19 kg/m.  Estimated Nutritional Needs:   Kcal:  2300-2500 kcal  Protein:  140-160 gm  Fluid:  >2L/day   Olivia Kenning, RD Registered Dietitian  See Amion for more information

## 2024-04-12 NOTE — Transfer of Care (Signed)
 Immediate Anesthesia Transfer of Care Note  Patient: Dennis Wise  Procedure(s) Performed: OPEN REDUCTION INTERNAL FIXATION FEMUR FRACTURE (Left: Leg Upper)  Patient Location: PACU  Anesthesia Type:General  Level of Consciousness: awake, drowsy, and patient cooperative  Airway & Oxygen Therapy: Patient Spontanous Breathing and Patient connected to face mask oxygen  Post-op Assessment: Report given to RN, Post -op Vital signs reviewed and stable, and Patient moving all extremities X 4  Post vital signs: Reviewed and stable  Last Vitals:  Vitals Value Taken Time  BP 105/41 04/12/24 20:15  Temp 36.8 C 04/12/24 19:15  Pulse 85 04/12/24 20:26  Resp 13 04/12/24 20:26  SpO2 100 % 04/12/24 20:26  Vitals shown include unfiled device data.  Last Pain:  Vitals:   04/12/24 1946  TempSrc:   PainSc: 7          Complications: No notable events documented.

## 2024-04-12 NOTE — Anesthesia Procedure Notes (Signed)
 Procedure Name: Intubation Date/Time: 04/12/2024 2:39 PM  Performed by: Julien Manus, CRNAPre-anesthesia Checklist: Patient identified, Emergency Drugs available, Suction available and Patient being monitored Patient Re-evaluated:Patient Re-evaluated prior to induction Oxygen Delivery Method: Circle System Utilized Preoxygenation: Pre-oxygenation with 100% oxygen Induction Type: IV induction Ventilation: Mask ventilation without difficulty Laryngoscope Size: Glidescope and 4 Grade View: Grade I Tube type: Oral Tube size: 8.0 mm Number of attempts: 1 Airway Equipment and Method: Stylet and Oral airway Placement Confirmation: ETT inserted through vocal cords under direct vision, positive ETCO2 and breath sounds checked- equal and bilateral Secured at: 24 cm Tube secured with: Tape Dental Injury: Teeth and Oropharynx as per pre-operative assessment

## 2024-04-12 NOTE — Progress Notes (Signed)
 " PROGRESS NOTE    Dennis Wise  FMW:[id number] DOB: 1950/08/17 DOA: 04/11/2024 PCP: [name], MD  Subjective: Patient reports having pain in his leg,   Hospital Course: Dennis Wise is a 73 y.o. male with medical history significant for CVA on aspirin /Plavix , anemia of chronic disease and iron  deficiency, chronic thrombocytopenia, CKD stage IIIa, HTN, HLD, T2DM, GAD, s/p left elbow ORIF 02/08/2024 and left THA 02/10/2024 who is admitted for left periprosthetic hip fracture.   Assessment and Plan:  Acute left periprosthetic hip fracture: Occurring after mechanical fall - ortho following and plan for ORIF vs proximal femur replacement today   Acute on chronic anemia - Hb dropped to 6.5 and he received 1U PRBC with repeat Hb 7.2 - he was given an additional unit prior to the OR - his CT left femur did mention suspected anterior thigh compartment muscle swelling with underlying hematoma not excluded, which could potentially be the source of acute anemia - monitor CBC closely and transfuse for Hb<7.5   Chronic thrombocytopenia: dates as far back as 2014 - platelets down to 85 this am - continue to monitor    CKD stage IIIa: Renal function relatively stable.  Continue to monitor.   History of CVA: Holding Plavix .   Type 2 diabetes: Placed on SSI.   Hypertension: Continue losartan  and HCTZ.   Hyperlipidemia: Continue atorvastatin .   Mild vascular neurocognitive disorder/GAD: Continue Lexapro  and Namenda .  Delirium precautions.    DVT prophylaxis: SCDs Start: 04/11/24 2344    Code Status: Full Code Family communication: family updated at bedside  Disposition Plan: TBD Reason for continuing need for hospitalization: OR today   Objective: Vitals:   04/12/24 0843 04/12/24 1143 04/12/24 1157 04/12/24 1230  BP: (!) 144/51  (!) 131/54 (!) 131/54  Pulse: (!) 56  63 (!) 59  Resp: 18  14 19   Temp: 97.6 F (36.4 C)  98.2 F (36.8 C) 98 F (36.7 C)  TempSrc: Oral   Oral Oral  SpO2: 97%  91% 99%  Weight:  110.2 kg    Height:       No intake or output data in the 24 hours ending 04/12/24 1410 Filed Weights   04/11/24 2034 04/12/24 1143  Weight: 122.5 kg 110.2 kg    Examination:  Physical Exam Vitals and nursing note reviewed.  Constitutional:      General: He is not in acute distress. Cardiovascular:     Rate and Rhythm: Normal rate.  Pulmonary:     Effort: No respiratory distress.     Breath sounds: No wheezing.  Abdominal:     General: There is no distension.  Musculoskeletal:     Comments: Left hip ROM not assess due to pain and fracture     Data Reviewed: I have personally reviewed following labs and imaging studies  CBC: Recent Labs  Lab 04/11/24 2044 04/12/24 0344 04/12/24 0940  WBC 5.4 4.8  --   NEUTROABS 4.1  --   --   HGB 8.0* 6.5* 7.2*  HCT 24.2* 20.4* 21.6*  MCV 91.0 93.6  --   PLT 94* 85*  --    Basic Metabolic Panel: Recent Labs  Lab 04/11/24 2044 04/12/24 0344  NA 136 137  K 4.9 4.9  CL 102 105  CO2 25 24  GLUCOSE 197* 240*  BUN 39* 37*  CREATININE 1.40* 1.36*  CALCIUM  8.6* 8.4*   GFR: Estimated Creatinine Clearance: 62 mL/min (A) (by C-G formula based on SCr of  1.36 mg/dL (H)). Liver Function Tests: No results for input(s): AST, ALT, ALKPHOS, BILITOT, PROT, ALBUMIN  in the last 168 hours. No results for input(s): LIPASE, AMYLASE in the last 168 hours. No results for input(s): AMMONIA in the last 168 hours. Coagulation Profile: Recent Labs  Lab 04/11/24 2044  INR 1.0   Cardiac Enzymes: No results for input(s): CKTOTAL, CKMB, CKMBINDEX, TROPONINI in the last 168 hours. ProBNP, BNP (last 5 results) No results for input(s): PROBNP, BNP in the last 8760 hours. HbA1C: No results for input(s): HGBA1C in the last 72 hours. CBG: Recent Labs  Lab 04/12/24 0757 04/12/24 0828 04/12/24 1159 04/12/24 1403  GLUCAP 175* 180* 156* 131*   Lipid Profile: No results  for input(s): CHOL, HDL, LDLCALC, TRIG, CHOLHDL, LDLDIRECT in the last 72 hours. Thyroid  Function Tests: No results for input(s): TSH, T4TOTAL, FREET4, T3FREE, THYROIDAB in the last 72 hours. Anemia Panel: No results for input(s): VITAMINB12, FOLATE, FERRITIN, TIBC, IRON , RETICCTPCT in the last 72 hours. Sepsis Labs: No results for input(s): PROCALCITON, LATICACIDVEN in the last 168 hours.  No results found for this or any previous visit (from the past 240 hours).   Radiology Studies: CT FEMUR LEFT WO CONTRAST Result Date: 04/12/2024 EXAM: CT OF THE LEFT FEMUR, WITHOUT IV CONTRAST 04/12/2024 06:01:52 AM TECHNIQUE: Axial images were acquired through the left femur without IV contrast. Reformatted images were reviewed. Automated exposure control, iterative reconstruction, and/or weight based adjustment of the mA/kV was utilized to reduce the radiation dose to as low as reasonably achievable. COMPARISON: Left hip and left knee films from yesterday. CLINICAL HISTORY: Fracture, femur Fracture, femur FINDINGS: BONES: There is spray artifact from a left hip replacement with cerclage wiring, and lateral side plate ORIF hardware in the proximal tibia as well as cerclage wiring with healed fracture of the patella. There is an acute spiral oblique perihardware proximal to mid femoral shaft fracture at the level of the distal third of the femoral stem. There is a sizable butterfly combination fragment posteriorly at the level of the fracture which is displaced posteriorly by about 2 widths of the cortex. The main distal fracture fragment is displaced anteriorly and laterally by one third of the shaft's width and is also rotated laterally, almost at a right angle to the proximal fragment. Bridging osteophytes anterior left SI joint. The visualized portions of the left hemipelvis are unremarkable. There is no further evidence of fractures. JOINTS: No dislocation. There is a low  density suprapatellar bursal effusion at the knee. There is mild arthrosis of the knee. No hip joint effusion. SOFT TISSUES: Although not well seen due to metal artifact, there is suspected to be some swelling in the anterior thigh compartment muscles. Underlying hematoma is not excluded. There are moderate calcified plaques in the left common femoral and superficial femoral arteries and in the popliteal and proximal popliteal trifurcation arteries. IMPRESSION: 1. Acute spiral oblique perihardware proximal to mid femoral shaft fracture at the level of the distal third of the femoral stem, with a sizable posterior butterfly fragment displaced posteriorly by about 2 cortex widths, and with the main distal fragment displaced anteriorly and laterally by one third shaft width and rotated laterally nearly 90 degrees relative to the proximal fragment. 2. Suspected anterior thigh compartment muscle swelling, with underlying hematoma not excluded. 3. Suprapatellar bursal effusion at the knee, without hip joint effusion. Electronically signed by: Francis Quam MD 04/12/2024 06:24 AM EST RP Workstation: [id number]   DG Knee Complete 4 Views Left Result Date:  04/11/2024 CLINICAL DATA:  Status post fall with left hip pain. EXAM: LEFT KNEE - COMPLETE 4+ VIEW COMPARISON:  None Available. FINDINGS: Periprosthetic fracture about the femoral stem of hip arthroplasty, partially included in the field of view. Evaluation of the knee is limited due to difficulties with positioning. No obvious additional fracture. Two K-wires traverse the patella, 1 of which is broken. Plate and screw fixation of the proximal lateral tibia. No joint effusion. Mild generalized soft tissue edema. IMPRESSION: 1. Periprosthetic fracture about the femoral stem of hip arthroplasty, partially included. 2. Surgical fixation of the patella, 1 of the K-wires is broken. 3. Allowing for difficulty in limitations with positioning, no additional acute fracture of  the knee. Electronically Signed   By: Andrea Gasman M.D.   On: 04/11/2024 21:38   DG Hip Unilat W or Wo Pelvis 2-3 Views Left Result Date: 04/11/2024 CLINICAL DATA:  Status post fall, left hip pain. EXAM: DG HIP (WITH OR WITHOUT PELVIS) 2-3V LEFT COMPARISON:  None Available. FINDINGS: Left hip arthroplasty in place. Mildly displaced oblique/spiral fracture about the femoral stem. The femoral component is seated in the acetabular component. No pelvic fracture. Pubic rami are intact. IMPRESSION: Mildly displaced oblique/spiral periprosthetic fracture about the femoral stem. Electronically Signed   By: Andrea Gasman M.D.   On: 04/11/2024 21:36    Scheduled Meds:  [FJM Hold] sodium chloride    Intravenous Once   [MAR Hold] sodium chloride    Intravenous Once   [MAR Hold] atorvastatin   10 mg Oral QHS   [MAR Hold] escitalopram   10 mg Oral Daily   [MAR Hold] hydrochlorothiazide   12.5 mg Oral Daily   [MAR Hold] insulin  aspart  0-9 Units Subcutaneous TID WC   [MAR Hold] losartan   100 mg Oral Daily   [MAR Hold] memantine   5 mg Oral BID   [MAR Hold] tamsulosin   0.4 mg Oral QHS   Continuous Infusions:  [MAR Hold]  ceFAZolin  (ANCEF ) IV     lactated ringers  10 mL/hr at 04/12/24 1304     LOS: 1 day   Time spent: 36 minutes  Casimer Dare, MD  [facility]  04/12/2024, 2:10 PM   "

## 2024-04-12 NOTE — Anesthesia Preprocedure Evaluation (Addendum)
 "                                  Anesthesia Evaluation  Patient identified by MRN, date of birth, ID band Patient awake    Reviewed: Allergy & Precautions, NPO status , Patient's Chart, lab work & pertinent test results  Airway Mallampati: IV  TM Distance: >3 FB Neck ROM: Full   Comment: Previous grade I view with Glidescope 4, easy mask Dental  (+) Teeth Intact, Dental Advisory Given   Pulmonary  [name]   Pulmonary exam normal breath sounds clear to auscultation       Cardiovascular hypertension (HCTZ, losartan . 131/54 preop), Pt. on medications Normal cardiovascular exam Rhythm:Regular Rate:Normal  HLD, bilateral carotid artery stenosis  TTE 02/09/2024: IMPRESSIONS    1. Left ventricular ejection fraction, by estimation, is 65 to 70%. The  left ventricle has normal function. The left ventricle has no regional  wall motion abnormalities. Left ventricular diastolic parameters were  normal.   2. Right ventricular systolic function is normal. The right ventricular  size is normal. There is normal pulmonary artery systolic pressure.   3. Left atrial size was mildly dilated.   4. The mitral valve is normal in structure. Trivial mitral valve  regurgitation. No evidence of mitral stenosis.   5. The aortic valve is tricuspid. There is mild calcification of the  aortic valve. Aortic valve regurgitation is not visualized. Aortic valve  sclerosis/calcification is present, without any evidence of aortic  stenosis.   6. The inferior vena cava is dilated in size with >50% respiratory  variability, suggesting right atrial pressure of 8 mmHg.     Neuro/Psych  PSYCHIATRIC DISORDERS Anxiety     Cervical stenosis s/p fusion 01/05/2024 CVA (09/05/2022, residual L sided weakness and some mild cognitive issues per wife), Residual Symptoms    GI/Hepatic ,GERD  Controlled,,NAFLD   Endo/Other  diabetes, Well Controlled, Type 2, Oral Hypoglycemic Agents  A1c 5.6   Renal/GU CRFRenal disease  negative genitourinary   Musculoskeletal negative musculoskeletal ROS (+)    Abdominal   Peds  Hematology  (+) Blood dyscrasia (MGUS, panyctopenia), anemia Lab Results      Component                Value               Date                      WBC                      4.8                 04/12/2024                HGB                      6.5 (LL)            04/12/2024                HCT                      20.4 (L)            04/12/2024                MCV  93.6                04/12/2024                PLT                      85 (L)              04/12/2024            Hb 7.2 post 2 units prbc    Anesthesia Other Findings Last Plavix : 04/11/2024  Hgb 6.5. Patient should be receiving 2 units pRBCs this morning prior to OR.  Reproductive/Obstetrics negative OB ROS                              Anesthesia Physical Anesthesia Plan  ASA: 3  Anesthesia Plan: General   Post-op Pain Management: Tylenol  PO (pre-op)*   Induction: Intravenous  PONV Risk Score and Plan: 2 and Ondansetron , Dexamethasone  and Treatment may vary due to age or medical condition  Airway Management Planned: Oral ETT and Video Laryngoscope Planned  Additional Equipment: None  Intra-op Plan:   Post-operative Plan: Extubation in OR  Informed Consent: I have reviewed the patients History and Physical, chart, labs and discussed the procedure including the risks, benefits and alternatives for the proposed anesthesia with the patient or authorized representative who has indicated his/her understanding and acceptance.     Dental advisory given  Plan Discussed with: CRNA  Anesthesia Plan Comments: (Received 2 units prbcs  1 unit plat and 2 more prbc ordered, will give 1 unit plt and hold additional prbcs if needed. Will check istat once 2nd unit of blood is finished from the floor.  Generally in poor overall health; d/w wife and  family full code status. D/w them potential for postop intubation/ICU care.)         Anesthesia Quick Evaluation  "

## 2024-04-12 NOTE — Significant Event (Signed)
 Patient's hemoglobin came back as 6.5 dropped from 8.  I discussed with patient for consent.  Patient requested to take the from patient's wife Ms. Dennis Wise.  Patient's wife agreed for the consent.  Ordered 1 unit of PRBC transfusion.  Redia Cleaver MD.

## 2024-04-12 NOTE — Op Note (Signed)
 01/01/2019 - 01/03/2019  6:13 PM  PATIENT:  Dennis Wise  73 y.o. male  PRE-OPERATIVE DIAGNOSIS:  LEFT PERIPROSTHETIC FEMUR FRACTURE AROUND LONG HIP STEM  POST-OPERATIVE DIAGNOSIS:   1. LEFT PERIPROSTHETIC FEMUR FRACTURE AROUND LONG HIP STEM 2. LEFT GLUTEAL HEMATOMA  PROCEDURE:  Procedure(s): 1. OPEN REDUCTION INTERNAL FIXATION (ORIF) LEFT FEMUR SHAFT FRACTURE 2. APPLICATION OF LARGE WOUND VAC  SURGEON:  Surgeon(s) and Role:    DEWAINE Bruch, Ozell, MD - Primary  PHYSICIAN ASSISTANT: KEITH PAUL,PA-C  ANESTHESIA:   general  EBL:  1800 ML  UOP: PENDING  BLOOD ADMINISTERED:4 CC PRBC, 1 FFP, and 2 PLTS (plus 2 u PRBC given in Short Stay pre-op)  DRAINS: Urinary Catheter (Foley) and Wound vac   LOCAL MEDICATIONS USED:  NONE  SPECIMEN:  No Specimen  DISPOSITION OF SPECIMEN:  N/A  COUNTS:  YES  TOURNIQUET:  * No tourniquets in log *  DICTATION: In EPIC.  PLAN OF CARE: Admit to inpatient   PATIENT DISPOSITION:  PACU - hemodynamically stable.   Delay start of Pharmacological VTE agent (>24hrs) due to surgical blood loss or risk of bleeding: YES, DELAY!  BRIEF SUMMARY OF INDICATION FOR PROCEDURE:  Dennis Wise is a 73 y.o. who sustained a fall resulting in comminuted spiral femur fracture around the femoral stem, which appeared to remain fixed to proximal femur. The risks and benefits of surgery were discussed with the patient and family, including the possibility of infection, nerve injury, vessel injury, wound breakdown, arthritis, symptomatic hardware, DVT/ PE, loss of motion, malunion, nonunion, heart attack, stroke, death, and need for further surgery which may include total joint revision among others.  These risks were acknowledged and consent provided to proceed.  BRIEF SUMMARY OF PROCEDURE:  The patient was taken to the operating room where general anesthesia was induced and after receipt of preoperative antibiotics.  The left lower extremity was prepped and draped in  usual sterile fashion with a bump under the operative hip and torso.  No tourniquet was used during the procedure.  A towel bump was placed underneath the femur to restore appropriate alignment and length. C-arm was brought in to confirm the appropriate position of the proximal incision.  This was checked on lateral as well.  An incision was then made through the old surgical scar proximally.  Dissection was carried down to the IT band and then the vastus. At that point we encountered a very large hematoma in the left gluteus, which was evacuated with suction. We then returned to the operative field where the vastus was retracted anteriorly while the perforating vessels were controlled and cauterizerd.  Deep retractors were placed.  Hematoma was evacuated from the fracture sites. My assistant pulled and maintained traction and dialed in the rotation for alignment, while I placed Verbruge clamps initially then three doubled fiber tape cerclages to establish reduction and obtain compression. After confirming this on AP and LAT views with x-ray I then introduced the [employer] plate.  I placed a Verbruge proximally and pins distally and then checked the plate position on both AP and lateral views proximally and distally.  Additional pins and screws were placed sequentially while making sure that proper alignment was maintained throughout.  Bicortical screws were placed distally while fixation in the proximal segment was obtained by combining unicortical locked screws with 3 additional tensioned cerclage sutures over the plate. All were confirmed for appropriate position on orthogonal views.  Wounds were irrigated thoroughly and then closed in standard  layered fashion using #1 Vicryl for the tensor, 0 Vicryl for the deep subcu, 2-0 Vicryl and 2-0 Nylon for the skin.  A sterile gently compressive dressing was then applied with an Ace wrap from foot to thigh as well. Patient will continue hip precautions but  otherwise unrestricted range of motion. Francis Mt, PA-C, assisted me throughout and an assistant was necessary to obtain and maintain reduction during provisional and definitive fixation and also assisted with wound closure. We also required a second assistant during the reduction and provisional fixation stage, with one assist retracting while the other pulled traction and corrected rotation. A large wound vac was placed over the 30+cm wound given his induration and the discovery of the gluteal hematoma. Foley catheter was placed at the end of the case and total amount is not yet available.   PROGNOSIS:  Because of comorbidities and impaired bone quality, patient is at increased risk for perioperative complications. PT/ OT to assist with nonweightbearing, post hip precautions, and unrestricted range of motion of the knee without bracing.   Because of bleeding risk, formal pharmacologic DVT prophylaxis with Lovenox  will be started after an initial delay, and then we will restart the Plavix  even later. This is balanced against his stroke risk. The catheter will need to stay in for the next 2 days given the need for accurate monitoring of his fluid status and the diuresis expected over the next several days. F/u in the office in 2-3 weeks for removal of sutures.     Ozell DEL. Celena, M.D.

## 2024-04-12 NOTE — Consult Note (Signed)
 "             Orthopaedic Trauma Service (OTS) Consult   Patient ID: Dennis Wise MRN: [id number] DOB/AGE: 1950/08/29 73 y.o.   Reason for Consult: Left periprosthetic proximal femur fracture Referring Physician: Fonda Law, MD (EDP)   HPI: Dennis Wise is an 73 y.o. male complex medical history who is well-known to the orthopedic trauma service after sustaining a fall back in October when he sustained a left olecranon fracture as well as a left proximal femur fracture.  He underwent ORIF of his left olecranon by the orthopedic trauma service and then underwent left total hip arthroplasty using revision femoral components by Dr. Carlo.  He has been followed very closely by both of our practices.  He has healed his olecranon fracture and was was recently released by Dr. Edna.  Unfortunately last evening he had dropped something on the floor and was using his lift chair when he was putting himself up into a more overt balance fell over landing directly on his left leg.  Patient had immediate onset of pain and inability to bear weight.  He was brought to [facility] where he was found to have a left periprosthetic proximal femur fracture.  Orthopedics was consulted for their evaluation.  He has been admitted to the medical service due to his complex medical history.  He is on Plavix  at baseline and last dose was yesterday morning (04/11/2024)  Reports pain in his left femur.  Denies any additional injuries elsewhere.  Wife is present this morning at bedside  AM hemoglobin hematocrit is 6.5 and 20.4.  He did receive a unit of blood prior to getting up to 6 N.  An additional unit was ordered by myself upon review of his labs.  Platelets are 85,000  Past Medical History:  Diagnosis Date   Actinic keratosis 10/16/2013   Bilateral carotid artery stenosis 09/23/2021   Less than 50% bilaterally 2023   Cervical stenosis of spinal canal    Combined form of senile cataract of both eyes  05/02/2016   Diabetic retinopathy 03/21/2005   Dupuytren contracture 03/03/2019   Essential hypertension 11/20/1999   Last Assessment & Plan: Change to 5mg  ramipril  and see if the lightheaded troubles get better.  He'll update me as needed.   [name]    Generalized anxiety disorder 10/01/2022   GERD (gastroesophageal reflux disease) 01/1989   Hearing loss 10/20/2014   Helicobacter pylori gastritis 06/11/2011   On EGD 05/2011    Hemorrhagic disorder due to circulating anticoagulants 09/27/2022   History of colonic polyps 06/05/2012   06/05/2011 - 12 polyps max 7 mm, at least 9 adenomas  04/07/2013 - no polyps - repeat colonoscopy 2019 Dennis CHARLENA Commander, MD, [facility]        IMO SNOMED Dx Update Oct 2024     Hyperlipidemia    Internal hemorrhoids    Iron  deficiency anemia, unspecified 03/04/2011   Ischemic right PCA stroke 09/05/2022   Left homonymous hemianopsia 08/2022   Caused by right PCA stroke   MGUS (monoclonal gammopathy of unknown significance)    possible dx initially, had negative f/u   Mild vascular neurocognitive disorder 05/07/2023   Monoclonal B-cell lymphocytosis 01/15/2022   NAFLD (nonalcoholic fatty liver disease)    Nocturia 08/18/2021   Normocytic anemia 04/03/2021   Obesity (BMI 30-39.9) 10/16/2013   Pain in joint of left knee 09/27/2022   Pancytopenia, acquired 05/01/2011   Pneumonia    Pseudophakia, both eyes  07/17/2016   Pure hypercholesterolemia 04/21/1988   Last Assessment & Plan: Continue statin, continue work on diet, d/w pt about labs.  He agrees.   Shoulder pain 07/02/2022   Syncope 11/22/2021   Thrombocytopenia 05/03/2021   Tibial fracture 10/24/2010   L tibia.  Repair 2012.    L tibia.  Repair 2012.  Last Assessment & Plan:  Per ortho.     Type II diabetes mellitus 04/21/1988   With h/o dec in sensation on feet     Unstable ankle 09/12/2020    Past Surgical History:  Procedure Laterality Date   CATARACT EXTRACTION Bilateral    CERVICAL  FUSION  01/05/2024   C2-T2   COLONOSCOPY     ESOPHAGOGASTRODUODENOSCOPY     2012 H. pylori gastritis   FLEXIBLE SIGMOIDOSCOPY  05/1988   internal hemorrhoids   KNEE SURGERY  12/2002   lt knee fx repair ORIF   ORIF ELBOW FRACTURE Left 02/08/2024   Procedure: OPEN REDUCTION INTERNAL FIXATION (ORIF) ELBOW/OLECRANON FRACTURE;  Surgeon: Celena Sharper, MD;  Location: [facility] OR;  Service: Orthopedics;  Laterality: Left;   TIBIA FRACTURE SURGERY     left 2012   TOTAL HIP ARTHROPLASTY Left 02/10/2024   Procedure: ARTHROPLASTY, HIP, TOTAL,POSTERIOR APPROACH;  Surgeon: Edna Toribio LABOR, MD;  Location: [facility] ORS;  Service: Orthopedics;  Laterality: Left;    Family History  Problem Relation Age of Onset   Cancer Mother        ?   Diabetes Mother    Heart failure Mother    Emphysema Father    Cancer Father        ?   Alzheimer's disease Father    Dementia Father    Skin cancer Brother    Diabetes Brother    Colon cancer Neg Hx    Prostate cancer Neg Hx    Esophageal cancer Neg Hx    Rectal cancer Neg Hx    Stomach cancer Neg Hx     Social History:  reports that he has never smoked. He has never used smokeless tobacco. He reports that he does not drink alcohol  and does not use drugs.  Allergies: Allergies[1]  Medications: I have reviewed the patient's current medications. Active Medications[2]   Results for orders placed or performed during the hospital encounter of 04/11/24 (from the past 48 hours)  Basic metabolic panel     Status: Abnormal   Collection Time: 04/11/24  8:44 PM  Result Value Ref Range   Sodium 136 135 - 145 mmol/L   Potassium 4.9 3.5 - 5.1 mmol/L   Chloride 102 98 - 111 mmol/L   CO2 25 22 - 32 mmol/L   Glucose, Bld 197 (H) 70 - 99 mg/dL    Comment: Glucose reference range applies only to samples taken after fasting for at least 8 hours.   BUN 39 (H) 8 - 23 mg/dL   Creatinine, Ser 8.59 (H) 0.61 - 1.24 mg/dL   Calcium  8.6 (L) 8.9 - 10.3 mg/dL   GFR, Estimated  52 (L) >60 mL/min    Comment: (NOTE) Calculated using the CKD-EPI Creatinine Equation (2021)    Anion gap 9 5 - 15    Comment: Performed at [facility], [address], [place], [place] [address]  CBC with Differential     Status: Abnormal   Collection Time: 04/11/24  8:44 PM  Result Value Ref Range   WBC 5.4 4.0 - 10.5 K/uL   RBC 2.66 (L) 4.22 - 5.81 MIL/uL  Hemoglobin 8.0 (L) 13.0 - 17.0 g/dL   HCT 75.7 (L) 60.9 - 47.9 %   MCV 91.0 80.0 - 100.0 fL   MCH 30.1 26.0 - 34.0 pg   MCHC 33.1 30.0 - 36.0 g/dL   RDW 81.5 (H) 88.4 - 84.4 %   Platelets 94 (L) 150 - 400 K/uL    Comment: REPEATED TO VERIFY Immature Platelet Fraction may be clinically indicated, consider ordering this additional test [id number]    nRBC 0.0 0.0 - 0.2 %   Neutrophils Relative % 77 %   Neutro Abs 4.1 1.7 - 7.7 K/uL   Lymphocytes Relative 13 %   Lymphs Abs 0.7 0.7 - 4.0 K/uL   Monocytes Relative 7 %   Monocytes Absolute 0.4 0.1 - 1.0 K/uL   Eosinophils Relative 2 %   Eosinophils Absolute 0.1 0.0 - 0.5 K/uL   Basophils Relative 0 %   Basophils Absolute 0.0 0.0 - 0.1 K/uL   Immature Granulocytes 1 %   Abs Immature Granulocytes 0.07 0.00 - 0.07 K/uL    Comment: Performed at [facility], [address] [place], [place] [address]  Protime-INR     Status: None   Collection Time: 04/11/24  8:44 PM  Result Value Ref Range   Prothrombin Time 13.6 11.4 - 15.2 seconds   INR 1.0 0.8 - 1.2    Comment: (NOTE) INR goal varies based on device and disease states. Performed at [facility], [address], [place], [place] [address]   Type and screen [facility]     Status: None (Preliminary result)   Collection Time: 04/12/24 12:17 AM  Result Value Ref Range   ABO/RH(D) B POS    Antibody Screen NEG    Sample Expiration      04/15/2024,2359 Performed at [facility], [address], [place], [place] [address]    Unit Number [id number]    Blood Component Type RED  CELLS,LR    Unit division 00    Status of Unit ISSUED    Transfusion Status OK TO TRANSFUSE    Crossmatch Result Compatible    Unit Number [id number]    Blood Component Type RED CELLS,LR    Unit division 00    Status of Unit ALLOCATED    Transfusion Status OK TO TRANSFUSE    Crossmatch Result Compatible   Basic metabolic panel     Status: Abnormal   Collection Time: 04/12/24  3:44 AM  Result Value Ref Range   Sodium 137 135 - 145 mmol/L   Potassium 4.9 3.5 - 5.1 mmol/L   Chloride 105 98 - 111 mmol/L   CO2 24 22 - 32 mmol/L   Glucose, Bld 240 (H) 70 - 99 mg/dL    Comment: Glucose reference range applies only to samples taken after fasting for at least 8 hours.   BUN 37 (H) 8 - 23 mg/dL   Creatinine, Ser 8.63 (H) 0.61 - 1.24 mg/dL   Calcium  8.4 (L) 8.9 - 10.3 mg/dL   GFR, Estimated 54 (L) >60 mL/min    Comment: (NOTE) Calculated using the CKD-EPI Creatinine Equation (2021)    Anion gap 8 5 - 15    Comment: Performed at [facility], [address] [place], [place] [address]  CBC     Status: Abnormal   Collection Time: 04/12/24  3:44 AM  Result Value Ref Range   WBC 4.8 4.0 - 10.5 K/uL   RBC 2.18 (L)  4.22 - 5.81 MIL/uL   Hemoglobin 6.5 (LL) 13.0 - 17.0 g/dL    Comment: REPEATED TO VERIFY This critical result has been called to K. Everette, RN by Miguel Reveal on 04/12/2024 04:19:30, and has been read back.    HCT 20.4 (L) 39.0 - 52.0 %   MCV 93.6 80.0 - 100.0 fL   MCH 29.8 26.0 - 34.0 pg   MCHC 31.9 30.0 - 36.0 g/dL   RDW 81.7 (H) 88.4 - 84.4 %   Platelets 85 (L) 150 - 400 K/uL    Comment: REPEATED TO VERIFY Immature Platelet Fraction may be clinically indicated, consider ordering this additional test [id number]    nRBC 0.0 0.0 - 0.2 %    Comment: Performed at [facility], [address] [place], [place] [address]  Prepare RBC (crossmatch)     Status: None   Collection Time: 04/12/24  4:26 AM  Result Value Ref Range   Order Confirmation      ORDER  PROCESSED BY BLOOD BANK Performed at [facility], [address], [place], [place] [address]   CBG monitoring, ED     Status: Abnormal   Collection Time: 04/12/24  7:57 AM  Result Value Ref Range   Glucose-Capillary 175 (H) 70 - 99 mg/dL    Comment: Glucose reference range applies only to samples taken after fasting for at least 8 hours.  Glucose, capillary     Status: Abnormal   Collection Time: 04/12/24  8:28 AM  Result Value Ref Range   Glucose-Capillary 180 (H) 70 - 99 mg/dL    Comment: Glucose reference range applies only to samples taken after fasting for at least 8 hours.  Prepare RBC (crossmatch)     Status: None   Collection Time: 04/12/24  8:38 AM  Result Value Ref Range   Order Confirmation      ORDER PROCESSED BY BLOOD BANK Performed at [facility], [address], [place], [place] [address]   Hemoglobin and hematocrit, blood     Status: Abnormal   Collection Time: 04/12/24  9:40 AM  Result Value Ref Range   Hemoglobin 7.2 (L) 13.0 - 17.0 g/dL   HCT 78.3 (L) 60.9 - 47.9 %    Comment: Performed at [facility], [address], [place], [place] [address]    CT FEMUR LEFT WO CONTRAST Result Date: 04/12/2024 EXAM: CT OF THE LEFT FEMUR, WITHOUT IV CONTRAST 04/12/2024 06:01:52 AM TECHNIQUE: Axial images were acquired through the left femur without IV contrast. Reformatted images were reviewed. Automated exposure control, iterative reconstruction, and/or weight based adjustment of the mA/kV was utilized to reduce the radiation dose to as low as reasonably achievable. COMPARISON: Left hip and left knee films from yesterday. CLINICAL HISTORY: Fracture, femur Fracture, femur FINDINGS: BONES: There is spray artifact from a left hip replacement with cerclage wiring, and lateral side plate ORIF hardware in the proximal tibia as well as cerclage wiring with healed fracture of the patella. There is an acute spiral oblique perihardware proximal to mid femoral shaft  fracture at the level of the distal third of the femoral stem. There is a sizable butterfly combination fragment posteriorly at the level of the fracture which is displaced posteriorly by about 2 widths of the cortex. The main distal fracture fragment is displaced anteriorly and laterally by one third of the shaft's width and is also rotated laterally, almost at a right angle to the proximal fragment. Bridging osteophytes anterior  left SI joint. The visualized portions of the left hemipelvis are unremarkable. There is no further evidence of fractures. JOINTS: No dislocation. There is a low density suprapatellar bursal effusion at the knee. There is mild arthrosis of the knee. No hip joint effusion. SOFT TISSUES: Although not well seen due to metal artifact, there is suspected to be some swelling in the anterior thigh compartment muscles. Underlying hematoma is not excluded. There are moderate calcified plaques in the left common femoral and superficial femoral arteries and in the popliteal and proximal popliteal trifurcation arteries. IMPRESSION: 1. Acute spiral oblique perihardware proximal to mid femoral shaft fracture at the level of the distal third of the femoral stem, with a sizable posterior butterfly fragment displaced posteriorly by about 2 cortex widths, and with the main distal fragment displaced anteriorly and laterally by one third shaft width and rotated laterally nearly 90 degrees relative to the proximal fragment. 2. Suspected anterior thigh compartment muscle swelling, with underlying hematoma not excluded. 3. Suprapatellar bursal effusion at the knee, without hip joint effusion. Electronically signed by: Francis Quam MD 04/12/2024 06:24 AM EST RP Workstation: [id number]   DG Knee Complete 4 Views Left Result Date: 04/11/2024 CLINICAL DATA:  Status post fall with left hip pain. EXAM: LEFT KNEE - COMPLETE 4+ VIEW COMPARISON:  None Available. FINDINGS: Periprosthetic fracture about the femoral  stem of hip arthroplasty, partially included in the field of view. Evaluation of the knee is limited due to difficulties with positioning. No obvious additional fracture. Two K-wires traverse the patella, 1 of which is broken. Plate and screw fixation of the proximal lateral tibia. No joint effusion. Mild generalized soft tissue edema. IMPRESSION: 1. Periprosthetic fracture about the femoral stem of hip arthroplasty, partially included. 2. Surgical fixation of the patella, 1 of the K-wires is broken. 3. Allowing for difficulty in limitations with positioning, no additional acute fracture of the knee. Electronically Signed   By: Andrea Gasman M.D.   On: 04/11/2024 21:38   DG Hip Unilat W or Wo Pelvis 2-3 Views Left Result Date: 04/11/2024 CLINICAL DATA:  Status post fall, left hip pain. EXAM: DG HIP (WITH OR WITHOUT PELVIS) 2-3V LEFT COMPARISON:  None Available. FINDINGS: Left hip arthroplasty in place. Mildly displaced oblique/spiral fracture about the femoral stem. The femoral component is seated in the acetabular component. No pelvic fracture. Pubic rami are intact. IMPRESSION: Mildly displaced oblique/spiral periprosthetic fracture about the femoral stem. Electronically Signed   By: Andrea Gasman M.D.   On: 04/11/2024 21:36    Intake/Output    None      ROS As above No chest pain or shortness of breath No abdominal pain No nausea or vomiting Blood pressure (!) 144/51, pulse (!) 56, temperature 97.6 F (36.4 C), temperature source Oral, resp. rate 18, height 6' 2 (1.88 m), weight 110.2 kg, SpO2 97%. Physical Exam  Gen: Pleasant 73 year old male resting in bed, no acute distress, awake and alert.  Oriented to person place and time.  Communication is clear Lungs: unlabored Cardiac: reg, s1 and s2 Abd: +BS  Left Lower Extremity   Left leg is shortened and externally rotated  Moderate swelling the left thigh  Did not appreciate any significant wounds though I did not move his leg  due to pain  Knee is nontender  Calf is nontender  Skin with changes consistent with PVD distally  No other traumatic or chronic wounds noted  DPN, SPN, TN sensory functions are grossly intact  EHL, FHL, lesser toe motor  functions are grossly intact  Patient is able to perform ankle flexion, extension, inversion eversion  Left upper extremity  Left elbow looks good, surgical wounds well-healed, nontender over his olecranon  Good elbow motion is noted  Motor and sensory functions are intact   Assessment/Plan:  73 year old male 2 months s/p left total hip arthroplasty with revision femoral components s/p fall on 04/11/2024 with left periprosthetic proximal femur fracture  - Fall  -Left periprosthetic proximal femur fracture  I discussed various treatment options with patient and wife including ORIF versus proximal femoral replacement  They wish to pursue ORIF at this time which I feel is very reasonable  Hopeful for OR later today provided his labs are stable   He will be nonweightbearing on his left leg for 6 to 8 weeks   Total hip precautions remain in place   No knee range of motion restrictions postoperatively   He has increased risk for complications given his numerous hospitalizations over the last several months with major orthopedic surgeries  - Pain management:  Multimodal  - ABL anemia/Hemodynamics  Received 1 unit of PRBCs already.  Second unit has been ordered  Will also type cross and hold 2 units of PRBCs for the OR as well as have a pack of platelets ready for the OR as well  - Medical issues   Per primary  - DVT/PE prophylaxis:  SCDs for now  Will likely hold Plavix  for 48 to 72 hours but can do Lovenox  postoperatively - ID:   Perioperative antibiotics  - Metabolic Bone Disease:  Fracture is a fragility fracture  Vitamin D  levels were in the inside though mildly so at his last admissions  He will need a DEXA as an outpatient  Continues to remain  high risk for subsequent fractures  - Activity:  As above  - FEN/GI prophylaxis/Foley/Lines:  N.p.o.  Advance diet postoperatively  - Impediments to fracture healing:  Vitamin D  insufficiency, poor bone quality  Diabetes  - Dispo:  Hopeful for OR later today for ORIF left femur     Francis MICAEL Mt, PA-C [phone] (C) 04/12/2024, 11:54 AM  [facility] [address] [place] [address] [phone] [id number] [phone] (F)    After 5pm and on the weekends please log on to Amion, go to orthopaedics and the look under the [facility] Call for the provider(s) on call. You can also call our office at [phone] and then follow the prompts to be connected to the call team.      [1]  Allergies Allergen Reactions   Hydralazine      Short of breath.     Promethazine Hcl     AGITIATION  [2]  Current Meds  Medication Sig   acetaminophen  (TYLENOL ) 325 MG tablet Take 2 tablets (650 mg total) by mouth every 4 (four) hours as needed for mild pain (or temp > 37.5 C (99.5 F)).   atorvastatin  (LIPITOR) 10 MG tablet Take 1 tablet (10 mg total) by mouth at bedtime.   clopidogrel  (PLAVIX ) 75 MG tablet TAKE 1 TABLET EVERY DAY   escitalopram  (LEXAPRO ) 10 MG tablet Take 1 tablet (10 mg total) by mouth daily.   glimepiride  (AMARYL ) 2 MG tablet Take 1 tablet (2 mg total) by mouth daily with breakfast.   hydrochlorothiazide  (HYDRODIURIL ) 25 MG tablet Take 0.5 tablets (12.5 mg total) by mouth daily.   IRON -VITAMIN C  PO Take 1 tablet by mouth in the morning.   losartan  (COZAAR ) 100 MG tablet  TAKE 1 TABLET EVERY DAY   memantine  (NAMENDA ) 5 MG tablet Take 1 tablet (5 mg total) by mouth 2 (two) times daily.   metFORMIN  (GLUCOPHAGE ) 850 MG tablet Take 1 tablet (850 mg total) by mouth 2 (two) times daily.   Multiple Vitamin (MULTIVITAMIN) tablet Take 1 tablet by mouth at bedtime. Centrum Silver   niacin  (VITAMIN B3) 500 MG ER tablet Take 1 tablet (500 mg total) by  mouth 2 (two) times daily.   pioglitazone  (ACTOS ) 45 MG tablet Take 1 tablet (45 mg total) by mouth daily.   tamsulosin  (FLOMAX ) 0.4 MG CAPS capsule Take 0.4 mg by mouth at bedtime.   "

## 2024-04-12 NOTE — Progress Notes (Signed)
"     Orthopaedic Trauma Service   Orthopedic trauma service consulted for left periprosthetic proximal femur fracture.  Patient well-known to orthopedic trauma service from a previous fall back in October.  Sustained left olecranon fracture and a comminuted left proximal femur fracture.  He underwent repair of his left olecranon by our service and underwent left total hip arthroplasty using revision femoral components due to extensive comminution by Dr. Edna   Patient sustained another fall landing on his left hip now has an acute fracture around his femoral stem.  This will require either ORIF versus proximal femur replacement.  Will discuss with patient and wife.  AM labs show hemoglobin and hematocrit of 6.5 and 20.4.  He is already received 1 unit of blood.  I have ordered an additional unit as well as repeat labs.  His platelets are 85,000 as well  Anticipate OR later this afternoon  Full consult to follow  Keep n.p.o.  Francis MICAEL Mt, PA-C [phone] (C) 04/12/2024, 8:44 AM  [facility] [address] [place] [address] [phone] MAXIMINO MILLING (F)     Patient ID: SHADEN LACHER, male   DOB: 06/30/1950, 73 y.o.   MRN: [id number]  "

## 2024-04-13 ENCOUNTER — Encounter (HOSPITAL_COMMUNITY): Payer: Self-pay | Admitting: Orthopedic Surgery

## 2024-04-13 DIAGNOSIS — M978XXA Periprosthetic fracture around other internal prosthetic joint, initial encounter: Secondary | ICD-10-CM | POA: Diagnosis not present

## 2024-04-13 DIAGNOSIS — D649 Anemia, unspecified: Secondary | ICD-10-CM

## 2024-04-13 DIAGNOSIS — Z96649 Presence of unspecified artificial hip joint: Secondary | ICD-10-CM | POA: Diagnosis not present

## 2024-04-13 DIAGNOSIS — D696 Thrombocytopenia, unspecified: Secondary | ICD-10-CM | POA: Diagnosis not present

## 2024-04-13 LAB — PREPARE PLATELET PHERESIS
Unit division: 0
Unit division: 0

## 2024-04-13 LAB — BPAM PLATELET PHERESIS
Blood Product Expiration Date: 202512242359
Blood Product Expiration Date: 202512242359
ISSUE DATE / TIME: 202512231300
ISSUE DATE / TIME: 202512231646
Unit Type and Rh: 6200
Unit Type and Rh: 7300

## 2024-04-13 LAB — BPAM FFP
Blood Product Expiration Date: 202512282359
ISSUE DATE / TIME: 202512231714
Unit Type and Rh: 7300

## 2024-04-13 LAB — PREPARE FRESH FROZEN PLASMA

## 2024-04-13 LAB — CBC WITH DIFFERENTIAL/PLATELET
Abs Immature Granulocytes: 0.15 K/uL — ABNORMAL HIGH (ref 0.00–0.07)
Basophils Absolute: 0 K/uL (ref 0.0–0.1)
Basophils Relative: 0 %
Eosinophils Absolute: 0 K/uL (ref 0.0–0.5)
Eosinophils Relative: 0 %
HCT: 21.5 % — ABNORMAL LOW (ref 39.0–52.0)
Hemoglobin: 7.6 g/dL — ABNORMAL LOW (ref 13.0–17.0)
Immature Granulocytes: 2 %
Lymphocytes Relative: 9 %
Lymphs Abs: 0.8 K/uL (ref 0.7–4.0)
MCH: 30.4 pg (ref 26.0–34.0)
MCHC: 35.3 g/dL (ref 30.0–36.0)
MCV: 86 fL (ref 80.0–100.0)
Monocytes Absolute: 1.1 K/uL — ABNORMAL HIGH (ref 0.1–1.0)
Monocytes Relative: 13 %
Neutro Abs: 6.5 K/uL (ref 1.7–7.7)
Neutrophils Relative %: 76 %
Platelets: 77 K/uL — ABNORMAL LOW (ref 150–400)
RBC: 2.5 MIL/uL — ABNORMAL LOW (ref 4.22–5.81)
RDW: 16.4 % — ABNORMAL HIGH (ref 11.5–15.5)
WBC: 8.5 K/uL (ref 4.0–10.5)
nRBC: 0 % (ref 0.0–0.2)

## 2024-04-13 LAB — GLUCOSE, CAPILLARY
Glucose-Capillary: 258 mg/dL — ABNORMAL HIGH (ref 70–99)
Glucose-Capillary: 197 mg/dL — ABNORMAL HIGH (ref 70–99)
Glucose-Capillary: 197 mg/dL — ABNORMAL HIGH (ref 70–99)
Glucose-Capillary: 217 mg/dL — ABNORMAL HIGH (ref 70–99)
Glucose-Capillary: 159 mg/dL — ABNORMAL HIGH (ref 70–99)

## 2024-04-13 LAB — BASIC METABOLIC PANEL WITH GFR
Anion gap: 10 (ref 5–15)
BUN: 38 mg/dL — ABNORMAL HIGH (ref 8–23)
CO2: 24 mmol/L (ref 22–32)
Calcium: 8.2 mg/dL — ABNORMAL LOW (ref 8.9–10.3)
Chloride: 106 mmol/L (ref 98–111)
Creatinine, Ser: 1.6 mg/dL — ABNORMAL HIGH (ref 0.61–1.24)
GFR, Estimated: 45 mL/min — ABNORMAL LOW
Glucose, Bld: 250 mg/dL — ABNORMAL HIGH (ref 70–99)
Potassium: 5.3 mmol/L — ABNORMAL HIGH (ref 3.5–5.1)
Sodium: 140 mmol/L (ref 135–145)

## 2024-04-13 LAB — HEMOGLOBIN AND HEMATOCRIT, BLOOD
HCT: 23.1 % — ABNORMAL LOW (ref 39.0–52.0)
Hemoglobin: 8.3 g/dL — ABNORMAL LOW (ref 13.0–17.0)

## 2024-04-13 LAB — PREPARE RBC (CROSSMATCH)

## 2024-04-13 MED ORDER — JUVEN PO PACK
1.0000 | PACK | Freq: Two times a day (BID) | ORAL | Status: DC
  Administered 2024-04-13 – 2024-04-23 (×21): 1 via ORAL
  Filled 2024-04-13 (×10): qty 1

## 2024-04-13 MED ORDER — INSULIN ASPART 100 UNIT/ML IJ SOLN: 0.0000 [IU] | Freq: Every day | INTRAMUSCULAR | Status: DC

## 2024-04-13 MED ORDER — FUROSEMIDE 10 MG/ML IJ SOLN
20.0000 mg | Freq: Once | INTRAMUSCULAR | Status: AC
  Administered 2024-04-13: 20 mg via INTRAVENOUS

## 2024-04-13 MED ORDER — FUROSEMIDE 10 MG/ML IJ SOLN
20.0000 mg | Freq: Once | INTRAMUSCULAR | Status: AC
  Administered 2024-04-13: 20 mg via INTRAVENOUS
  Filled 2024-04-13: qty 2

## 2024-04-13 MED ORDER — SODIUM CHLORIDE 0.9% IV SOLUTION: Freq: Once | INTRAVENOUS | Status: AC

## 2024-04-13 MED ORDER — INSULIN GLARGINE 100 UNIT/ML ~~LOC~~ SOLN
10.0000 [IU] | SUBCUTANEOUS | Status: DC
  Administered 2024-04-13 – 2024-04-14 (×2): 10 [IU] via SUBCUTANEOUS
  Filled 2024-04-13 (×3): qty 0.1

## 2024-04-13 MED ORDER — ADULT MULTIVITAMIN W/MINERALS CH
1.0000 | ORAL_TABLET | Freq: Every day | ORAL | Status: DC
  Administered 2024-04-13 – 2024-04-23 (×11): 1 via ORAL
  Filled 2024-04-13 (×5): qty 1

## 2024-04-13 MED ORDER — INSULIN ASPART 100 UNIT/ML IJ SOLN
0.0000 [IU] | Freq: Three times a day (TID) | INTRAMUSCULAR | Status: DC
  Administered 2024-04-13: 3 [IU] via SUBCUTANEOUS
  Administered 2024-04-13: 5 [IU] via SUBCUTANEOUS
  Administered 2024-04-14 (×3): 2 [IU] via SUBCUTANEOUS
  Administered 2024-04-15 (×3): 3 [IU] via SUBCUTANEOUS
  Administered 2024-04-16: 2 [IU] via SUBCUTANEOUS
  Administered 2024-04-16 (×2): 3 [IU] via SUBCUTANEOUS
  Administered 2024-04-17: 5 [IU] via SUBCUTANEOUS
  Administered 2024-04-17: 3 [IU] via SUBCUTANEOUS
  Administered 2024-04-17: 5 [IU] via SUBCUTANEOUS
  Administered 2024-04-18 (×3): 2 [IU] via SUBCUTANEOUS
  Administered 2024-04-19: 3 [IU] via SUBCUTANEOUS
  Administered 2024-04-19 – 2024-04-21 (×4): 5 [IU] via SUBCUTANEOUS
  Administered 2024-04-21: 2 [IU] via SUBCUTANEOUS
  Administered 2024-04-22: 3 [IU] via SUBCUTANEOUS
  Administered 2024-04-22: 2 [IU] via SUBCUTANEOUS
  Administered 2024-04-22 – 2024-04-23 (×2): 3 [IU] via SUBCUTANEOUS
  Filled 2024-04-13: qty 2
  Filled 2024-04-13: qty 3
  Filled 2024-04-13: qty 2
  Filled 2024-04-13: qty 5
  Filled 2024-04-13 (×3): qty 3
  Filled 2024-04-13: qty 5
  Filled 2024-04-13 (×3): qty 3
  Filled 2024-04-13: qty 5
  Filled 2024-04-13: qty 3
  Filled 2024-04-13: qty 2

## 2024-04-13 MED ORDER — INSULIN ASPART 100 UNIT/ML IJ SOLN
2.0000 [IU] | Freq: Three times a day (TID) | INTRAMUSCULAR | Status: DC
  Administered 2024-04-13 – 2024-04-23 (×28): 2 [IU] via SUBCUTANEOUS
  Filled 2024-04-13 (×13): qty 2

## 2024-04-13 NOTE — Evaluation (Signed)
 Clinical/Bedside Swallow Evaluation Patient Details  Name: Dennis Wise MRN: [id number] Date of Birth: Oct 16, 1950  Today's Date: 04/13/2024 Time: SLP Start Time (ACUTE ONLY): 1024 SLP Stop Time (ACUTE ONLY): 1033 SLP Time Calculation (min) (ACUTE ONLY): 9 min  Past Medical History:  Past Medical History:  Diagnosis Date   Actinic keratosis 10/16/2013   Bilateral carotid artery stenosis 09/23/2021   Less than 50% bilaterally 2023   Cervical stenosis of spinal canal    Combined form of senile cataract of both eyes 05/02/2016   Diabetic retinopathy 03/21/2005   Dupuytren contracture 03/03/2019   Essential hypertension 11/20/1999   Last Assessment & Plan: Change to 5mg  ramipril  and see if the lightheaded troubles get better.  He'll update me as needed.   [name]    Generalized anxiety disorder 10/01/2022   GERD (gastroesophageal reflux disease) 01/1989   Hearing loss 10/20/2014   Helicobacter pylori gastritis 06/11/2011   On EGD 05/2011    Hemorrhagic disorder due to circulating anticoagulants 09/27/2022   History of colonic polyps 06/05/2012   06/05/2011 - 12 polyps max 7 mm, at least 9 adenomas  04/07/2013 - no polyps - repeat colonoscopy 2019 Dennis CHARLENA Commander, MD, [facility]        IMO SNOMED Dx Update Oct 2024     Hyperlipidemia    Internal hemorrhoids    Iron  deficiency anemia, unspecified 03/04/2011   Ischemic right PCA stroke 09/05/2022   Left homonymous hemianopsia 08/2022   Caused by right PCA stroke   MGUS (monoclonal gammopathy of unknown significance)    possible dx initially, had negative f/u   Mild vascular neurocognitive disorder 05/07/2023   Monoclonal B-cell lymphocytosis 01/15/2022   NAFLD (nonalcoholic fatty liver disease)    Nocturia 08/18/2021   Normocytic anemia 04/03/2021   Obesity (BMI 30-39.9) 10/16/2013   Pain in joint of left knee 09/27/2022   Pancytopenia, acquired 05/01/2011   Pneumonia    Pseudophakia, both eyes 07/17/2016   Pure  hypercholesterolemia 04/21/1988   Last Assessment & Plan: Continue statin, continue work on diet, d/w pt about labs.  He agrees.   Shoulder pain 07/02/2022   Syncope 11/22/2021   Thrombocytopenia 05/03/2021   Tibial fracture 10/24/2010   L tibia.  Repair 2012.    L tibia.  Repair 2012.  Last Assessment & Plan:  Per ortho.     Type II diabetes mellitus 04/21/1988   With h/o dec in sensation on feet     Unstable ankle 09/12/2020   Past Surgical History:  Past Surgical History:  Procedure Laterality Date   CATARACT EXTRACTION Bilateral    CERVICAL FUSION  01/05/2024   C2-T2   COLONOSCOPY     ESOPHAGOGASTRODUODENOSCOPY     2012 H. pylori gastritis   FLEXIBLE SIGMOIDOSCOPY  05/1988   internal hemorrhoids   KNEE SURGERY  12/2002   lt knee fx repair ORIF   ORIF ELBOW FRACTURE Left 02/08/2024   Procedure: OPEN REDUCTION INTERNAL FIXATION (ORIF) ELBOW/OLECRANON FRACTURE;  Surgeon: Celena Sharper, MD;  Location: [facility] OR;  Service: Orthopedics;  Laterality: Left;   ORIF FEMUR FRACTURE Left 04/12/2024   Procedure: OPEN REDUCTION INTERNAL FIXATION FEMUR FRACTURE;  Surgeon: Celena Sharper, MD;  Location: [facility] OR;  Service: Orthopedics;  Laterality: Left;   TIBIA FRACTURE SURGERY     left 2012   TOTAL HIP ARTHROPLASTY Left 02/10/2024   Procedure: ARTHROPLASTY, HIP, TOTAL,POSTERIOR APPROACH;  Surgeon: Edna Toribio LABOR, MD;  Location: [facility] ORS;  Service: Orthopedics;  Laterality: Left;   HPI:  Dennis Wise is a 73 y.o. male who presented to the ED for evaluation of left hip pain after fall at home. Found to have left periprosthetic femur fracture around long hip stem, now s/p ORIF L femur.  No head or chest imaging available. Mild dementia noted in chart. Pt with medical history significant for CVA on Plavix , anemia of chronic disease and iron  deficiency, chronic thrombocytopenia, CKD stage IIIa, HTN, HLD, T2DM, GAD, s/p left elbow ORIF 02/08/2024 and left THA 02/10/2024    Assessment /  Plan / Recommendation  Clinical Impression  Pt presents with normal swallow function as assessed clinically.  Pt tolerated all consistencies trialed including serial straw sips of thin liquid with no clinical s/s of aspiration. Pt exhibited excellent oral clearance of solids.  Pt with slightly gravely vocal quality at baseline.  This did not change with PO trials, and pt reports his voice sounds as it normally does, although it is unclear if he is an accurate historian.  Pt appears safe to continue current diet.  Pt has no further ST needs. SLP will sign off.    Recommend regular texture diet with thin liquids.    SLP Visit Diagnosis: Dysphagia, unspecified (R13.10)    Aspiration Risk  No limitations    Diet Recommendation Regular;Thin liquid    Liquid Administration via: Cup;Straw Medication Administration: Whole meds with liquid Supervision: Patient able to self feed Compensations: Slow rate;Small sips/bites Postural Changes:  (HOB as upright as tolerated s/p surgery)    Other Recommendations Oral Care Recommendations: Oral care BID     Swallow Evaluation Recommendations  See above   Assistance Recommended at Discharge  N/A  Functional Status Assessment Patient has not had a recent decline in their functional status  Frequency and Duration  (N/A)          Prognosis Prognosis for improved oropharyngeal function:  (N/A)      Swallow Study   General Date of Onset: 04/11/24 HPI: Dennis Wise is a 73 y.o. male who presented to the ED for evaluation of left hip pain after fall at home. Found to have left periprosthetic femur fracture around long hip stem, now s/p ORIF L femur.  No head or chest imaging available. Mild dementia noted in chart. Pt with medical history significant for CVA on Plavix , anemia of chronic disease and iron  deficiency, chronic thrombocytopenia, CKD stage IIIa, HTN, HLD, T2DM, GAD, s/p left elbow ORIF 02/08/2024 and left THA 02/10/2024 Type of Study:  Bedside Swallow Evaluation Previous Swallow Assessment: None Diet Prior to this Study: Regular;Thin liquids (Level 0) Temperature Spikes Noted: No History of Recent Intubation: Yes (for procedure) Date extubated: 04/12/24 Behavior/Cognition: Alert;Cooperative;Confused Oral Cavity Assessment: Within Functional Limits Oral Care Completed by SLP: No Oral Cavity - Dentition: Adequate natural dentition Vision: Functional for self-feeding Self-Feeding Abilities: Able to feed self Patient Positioning: Upright in bed Baseline Vocal Quality:  (Gravelly.  Pt states this is baseline) Volitional Cough: Strong Volitional Swallow: Able to elicit    Oral/Motor/Sensory Function Overall Oral Motor/Sensory Function: Within functional limits   Ice Chips Ice chips: Not tested   Thin Liquid Thin Liquid: Within functional limits Presentation: Straw    Nectar Thick Nectar Thick Liquid: Not tested   Honey Thick Honey Thick Liquid: Not tested   Puree Puree: Within functional limits Presentation: Spoon   Solid     Solid: Within functional limits Presentation: Self Fed      Anette FORBES Grippe, MA, CCC-SLP Acute Rehabilitation Services Office: [phone]  04/13/2024,10:43 AM

## 2024-04-13 NOTE — Progress Notes (Signed)
 Blood admin started, 15 min post op vitals completed, no adverse reactions to blood admin as of now, no complains from patient. All needs met at this time, bed in lowest position and call light within reach. Patient will now be transferred to 5W.

## 2024-04-13 NOTE — TOC CAGE-AID Note (Signed)
 Transition of Care [facility]) - CAGE-AID Screening  Patient Details  Name: Dennis Wise MRN: [id number] Date of Birth: [birth year]  Clinical Narrative:  Patient denies any alcohol  or drug use, no substance abuse resources needed at this time.  CAGE-AID Screening:   Have You Ever Felt You Ought to Cut Down on Your Drinking or Drug Use?: No Have People Annoyed You By Critizing Your Drinking Or Drug Use?: No Have You Felt Bad Or Guilty About Your Drinking Or Drug Use?: No Have You Ever Had a Drink or Used Drugs First Thing In The Morning to Steady Your Nerves or to Get Rid of a Hangover?: No CAGE-AID Score: 0  Substance Abuse Education Offered: No

## 2024-04-13 NOTE — Evaluation (Addendum)
 Physical Therapy Evaluation Patient Details Name: Dennis Wise MRN: [id number] DOB: 1951/01/25 Today's Date: 04/13/2024  History of Present Illness  73 yo M adm 12/22 due to fall with left periprosthetic femur fracture around long hip stem s/p ORIF L femur. PMH 01/2024 fall with L THA and ORIF L elbow, NAFLD, ischemc R PCA stroke (2024), HTN, DM2, hearing loss, C2-T2 fusion (12/2023).  Clinical Impression  Pt admitted with left periprosthetic femur fracture now s/p ORIF of L femur. Pt reporting recent d/c home from SNF with spouse one week ago. Attempted to transition to left edge of bed with +2 assist and LLE support, however, with LLE in dependent position, pt yelling very loudly and pt asking therapists to stop. Returned to supine. Pt rolled to R/L with totalA + 2-3 for bed pad placement. Placed bed in chair position and ice applied to L hip. RN provided IV pain medication. Recommend L prevalon boot to prevent breakdown on heel. Patient will benefit from continued inpatient follow up therapy, <3 hours/day.      If plan is discharge home, recommend the following: Two people to help with walking and/or transfers;Two people to help with bathing/dressing/bathroom   Can travel by private vehicle   No    Equipment Recommendations Other (comment) (defer)  Recommendations for Other Services       Functional Status Assessment Patient has had a recent decline in their functional status and demonstrates the ability to make significant improvements in function in a reasonable and predictable amount of time.     Precautions / Restrictions Precautions Precautions: Fall;Posterior Hip Precaution Booklet Issued: Yes (comment) Recall of Precautions/Restrictions: Impaired Precaution/Restrictions Comments: Visual deficits, wound vac Restrictions Weight Bearing Restrictions Per Provider Order: Yes LLE Weight Bearing Per Provider Order: Non weight bearing      Mobility  Bed Mobility Overal bed  mobility: Needs Assistance Bed Mobility: Rolling Rolling: Total assist, +2 for physical assistance         General bed mobility comments: Attempted to transition to left side of bed, but pt unable to tolerate LLE hanging against gravity, even with therapist support. Returned to supine. +2-3 to boost up in bed. Rolled to R/L for placement of bed pad with totalA +2. Bed placed in chair position    Transfers                   General transfer comment: Pt unable    Ambulation/Gait                  Stairs            Wheelchair Mobility     Tilt Bed    Modified Rankin (Stroke Patients Only)       Balance                                             Pertinent Vitals/Pain Pain Assessment Pain Assessment: Faces Faces Pain Scale: Hurts worst Pain Location: L hip Pain Descriptors / Indicators: Other (Comment), Operative site guarding, Grimacing, Sharp (yelling) Pain Intervention(s): Limited activity within patient's tolerance, Monitored during session, Premedicated before session, Repositioned, Ice applied    Home Living Family/patient expects to be discharged to:: Private residence Living Arrangements: Spouse/significant other Available Help at Discharge: Family Type of Home: House Home Access: Stairs to enter Entrance Stairs-Rails: Left Entrance Stairs-Number of Steps: 2  Home Layout: One level Home Equipment: Pharmacist, Hospital (2 wheels);Grab bars - toilet;Hand held shower head      Prior Function Prior Level of Function : Needs assist             Mobility Comments: LUE platform RW, spending a lot of time in lift chair ADLs Comments: Requiring assist since recent d/c one week ago from [name]     Extremity/Trunk Assessment   Upper Extremity Assessment Upper Extremity Assessment: Defer to OT evaluation    Lower Extremity Assessment Lower Extremity Assessment: LLE deficits/detail;RLE  deficits/detail RLE Deficits / Details: Able to perform heel slide, at least 3/5 strength LLE Deficits / Details: No active movement of hip/knee, limited due to pain       Communication   Communication Communication: Impaired Factors Affecting Communication: Hearing impaired    Cognition Arousal: Alert Behavior During Therapy: WFL for tasks assessed/performed   PT - Cognitive impairments: No family/caregiver present to determine baseline                       PT - Cognition Comments: Pt A&O to self and time. Pt recognizing he is in a hospital, but needs reminders about which one. Following commands: Impaired Following commands impaired: Follows one step commands inconsistently, Follows one step commands with increased time     Cueing Cueing Techniques: Verbal cues     General Comments      Exercises     Assessment/Plan    PT Assessment Patient needs continued PT services  PT Problem List Decreased strength;Decreased range of motion;Decreased activity tolerance;Decreased balance;Decreased mobility;Decreased cognition;Decreased safety awareness;Pain       PT Treatment Interventions Functional mobility training;Therapeutic activities;Therapeutic exercise;Balance training;Patient/family education;Wheelchair mobility training    PT Goals (Current goals can be found in the Care Plan section)  Acute Rehab PT Goals Patient Stated Goal: less pain PT Goal Formulation: With patient Time For Goal Achievement: 04/27/24 Potential to Achieve Goals: Fair    Frequency Min 2X/week     Co-evaluation PT/OT/SLP Co-Evaluation/Treatment: Yes Reason for Co-Treatment: Complexity of the patient's impairments (multi-system involvement);Necessary to address cognition/behavior during functional activity;For patient/therapist safety;To address functional/ADL transfers PT goals addressed during session: Mobility/safety with mobility         AM-PAC PT 6 Clicks Mobility   Outcome Measure Help needed turning from your back to your side while in a flat bed without using bedrails?: Total Help needed moving from lying on your back to sitting on the side of a flat bed without using bedrails?: Total Help needed moving to and from a bed to a chair (including a wheelchair)?: Total Help needed standing up from a chair using your arms (e.g., wheelchair or bedside chair)?: Total Help needed to walk in hospital room?: Total Help needed climbing 3-5 steps with a railing? : Total 6 Click Score: 6    End of Session   Activity Tolerance: Patient limited by pain Patient left: in bed;with call bell/phone within reach;with bed alarm set Nurse Communication: Mobility status PT Visit Diagnosis: Pain;Other abnormalities of gait and mobility (R26.89);History of falling (Z91.81) Pain - Right/Left: Left Pain - part of body: Hip    Time: 9140-9076 PT Time Calculation (min) (ACUTE ONLY): 24 min   Charges:   PT Evaluation $PT Eval Moderate Complexity: 1 Mod   PT General Charges $$ ACUTE PT VISIT: 1 Visit         Aleck Daring, PT, DPT Acute Rehabilitation Services Office [phone]  Aleck ONEIDA Daring 04/13/2024, 9:35 AM

## 2024-04-13 NOTE — Progress Notes (Signed)
 " PROGRESS NOTE Dennis Wise  FMW:[id number] DOB: 05/16/1950 DOA: 04/11/2024 PCP: [name], MD  Brief Narrative/Hospital Course: Dennis Wise is a 73 y.o. male with medical history significant for CVA on aspirin /Plavix , anemia of chronic disease and iron  deficiency, chronic thrombocytopenia, CKD stage IIIa, HTN, HLD, T2DM, GAD, s/p left elbow ORIF 02/08/2024 and left THA 02/10/2024 who is admitted for left periprosthetic hip fracture. 12/23: Seen by Dr. Celena status post left ORIF left femur shaft and large wound VAC placement  Subjective: Seen and examined today Alert awake resting, complains of pain on the left hip on any movement Overnight on 3 L [place] afebrile, VSS 120s systolic BP, Labs K slightly high 5.2-creatinine went from 1.5 hemoglobin dropped to 6.7 12/23 night platelet 85.  Patient had FFP x 1, platelet pheresis x 2, PRBC was transfused repeat lab came this morning hemoglobin up at 7.6 platelets 77  Assessment and plan:  Acute left periprosthetic hip fracture: 12/23: Seen by Dr. Celena status post left ORIF left femur shaft and large wound VAC placement Continue pain management DVT prophylaxis per surgery currently no anticoagulation or chemical DVT prophylaxis due to significant anemia  Acute on chronic anemia ABLA: Patient has received multiple blood transfusion, recheck H&H with improving 7.6, input appreciated and orthopedic team planning for additional 2 u PRBC 1 FFP and 1 platelet-along with IV Lasix  as ordered.  Watch for fluid overload. his CT left femur did mention suspected anterior thigh compartment muscle swelling with underlying hematoma not excluded, which could potentially be the source of acute anemia as well-no evidence of GI bleeding. Recent Labs  Lab 04/12/24 1446 04/12/24 1626 04/12/24 1933 04/12/24 2309 04/13/24 1024  HGB 7.8* 7.1* 7.8* 6.7* 7.6*  HCT 23.0* 21.0* 22.4* 19.3* 21.5*    Pancytopenia Chronic thrombocytopenia: MGUS bone marrow biopsy  and aspiration that revealed 3% plasma cells in the bone marrow in [facility] thrombocytopenia was felt likely ITP previously dates as far back as 2014. platelets down in 70s and getting additional 1 unit platelet in the setting of anemia Will ask Dr Autumn to eval. Recent Labs  Lab 04/11/24 2044 04/12/24 0344 04/12/24 1933 04/12/24 2309 04/13/24 1024  PLT 94* 85* 100* 85* 77*     CKD stage IIIa Mild hyperkalemia: Renal function relatively stable.  Continue to monitor.  Potassium slightly up but monitor Recent Labs    02/12/24 0552 02/13/24 0639 02/14/24 0613 02/15/24 0320 02/16/24 0544 02/17/24 0558 03/24/24 1514 04/11/24 2044 04/12/24 0344 04/12/24 1446 04/12/24 1626 04/12/24 2309  BUN 52* 54* 54* 54* 52* 48* 31* 39* 37*  --   --  38*  CREATININE 1.63* 1.48* 1.50* 1.47* 1.36* 1.34* 1.22 1.40* 1.36*  --   --  1.58*  CO2 24 23 22 23 23  20* 27 25 24   --   --  23  K 4.6 4.7 4.9 5.1 5.1 4.9 4.5 4.9 4.9 4.5 4.3 5.2*     History of CVA: Holding Plavix  in the setting of significant anemia-once okay with orthopedics and hemoglobin stable.   Type 2 diabetes: Poorly controlled hyperglycemia.PTA on glipizide metformin  Actos , holding p.o. meds.On SSI 0 to 15 units, add Semglee  10 units and novolog  2 u premeal  Recent Labs  Lab 04/12/24 1403 04/12/24 1920 04/12/24 2135 04/13/24 0807 04/13/24 1200  GLUCAP 131* 239* 215* 258* 197*    Hypertension: BP well-controlled.  Continue losartan  and HCTZ.   Hyperlipidemia: Continue atorvastatin .   Mild vascular neurocognitive disorder GAD: Stable, continue  Lexapro  and Namenda .keep on  delirium precautions.  Class I Obesity w/ Body mass index is 31.19 kg/m.: Will benefit with PCP follow-up, weight loss,healthy lifestyle and outpatient eval  Mobility: PT Orders: Active PT Follow up Rec: Skilled Nursing-Short Term Rehab (<3 Hours/Day)04/13/2024 [phone]   DVT prophylaxis: SCDs Start: 04/12/24 2251 SCDs Start: 04/11/24  2344 Code Status:   Code Status: Full Code Family Communication: plan of care discussed with patient at bedside. Patient status is: Remains hospitalized because of severity of illness Level of care: Progressive > transfer to progressive  Dispo: The patient is from: home at baseline.            Anticipated disposition: TBD Objective: Vitals last 24 hrs: Vitals:   04/13/24 0400 04/13/24 0550 04/13/24 0715 04/13/24 1045  BP: (!) 128/44 (!) 123/44 (!) 122/39 (!) 116/49  Pulse: 73 70 69 67  Resp: 18 17 18 17   Temp: 98.6 F (37 C) 98 F (36.7 C) 98.6 F (37 C) 98.6 F (37 C)  TempSrc: Oral  Oral Oral  SpO2: 100% 100% 100% 100%  Weight:      Height:        Physical Examination: General exam: alert awake, obese ill-appearing HEENT:Oral mucosa moist, Ear/Nose WNL grossly Respiratory system: Bilaterally clear BS, on nasal cannula, no use of accessory muscle Cardiovascular system: S1 & S2 +, No JVD. Gastrointestinal system: Abdomen soft,NT,ND, BS+ Nervous System: Alert, awake, moving all extremities,and following commands. Extremities: extremities warm, leg edema + more on left hip w/ dressing in place, wound VAC in place with sanguinous discharge Skin: Warm, no rashes MSK: Normal muscle bulk,tone, power   Medications reviewed:  Scheduled Meds:  sodium chloride    Intravenous Once   sodium chloride    Intravenous Once   sodium chloride    Intravenous Once   sodium chloride    Intravenous Once   vitamin C   1,000 mg Oral Daily   atorvastatin   10 mg Oral QHS   cholecalciferol   2,000 Units Oral BID   docusate sodium   100 mg Oral BID   escitalopram   10 mg Oral Daily   furosemide   20 mg Intravenous Once   furosemide   20 mg Intravenous Once   furosemide   20 mg Intravenous Once   hydrochlorothiazide   12.5 mg Oral Daily   insulin  aspart  0-15 Units Subcutaneous TID WC   insulin  aspart  0-5 Units Subcutaneous QHS   insulin  aspart  2 Units Subcutaneous TID WC   insulin  glargine  10 Units  Subcutaneous Q24H   losartan   100 mg Oral Daily   memantine   5 mg Oral BID   multivitamin with minerals  1 tablet Oral Daily   nutrition supplement (JUVEN)  1 packet Oral BID BM   Ensure Max Protein  11 oz Oral BID BM   tamsulosin   0.4 mg Oral QHS   Continuous Infusions: Diet: Diet Order             Diet Carb Modified Room service appropriate? Yes  Diet effective now                   Data Reviewed: I have personally reviewed following labs and imaging studies ( see epic result tab) CBC: Recent Labs  Lab 04/11/24 2044 04/12/24 0344 04/12/24 0940 04/12/24 1446 04/12/24 1626 04/12/24 1933 04/12/24 2309 04/13/24 1024  WBC 5.4 4.8  --   --   --  7.9 6.9 8.5  NEUTROABS 4.1  --   --   --   --   --   --  6.5  HGB 8.0* 6.5*   < > 7.8* 7.1* 7.8* 6.7* 7.6*  HCT 24.2* 20.4*   < > 23.0* 21.0* 22.4* 19.3* 21.5*  MCV 91.0 93.6  --   --   --  89.2 88.1 86.0  PLT 94* 85*  --   --   --  100* 85* 77*   < > = values in this interval not displayed.   CMP: Recent Labs  Lab 04/11/24 2044 04/12/24 0344 04/12/24 1446 04/12/24 1626 04/12/24 2309  NA 136 137 138 138 138  K 4.9 4.9 4.5 4.3 5.2*  CL 102 105  --   --  105  CO2 25 24  --   --  23  GLUCOSE 197* 240*  --   --  250*  BUN 39* 37*  --   --  38*  CREATININE 1.40* 1.36*  --   --  1.58*  CALCIUM  8.6* 8.4*  --   --  8.0*   GFR: Estimated Creatinine Clearance: 53.4 mL/min (A) (by C-G formula based on SCr of 1.58 mg/dL (H)). No results for input(s): AST, ALT, ALKPHOS, BILITOT, PROT, ALBUMIN  in the last 168 hours. No results for input(s): LIPASE, AMYLASE in the last 168 hours. No results for input(s): AMMONIA in the last 168 hours. Coagulation Profile:  Recent Labs  Lab 04/11/24 2044  INR 1.0   Unresulted Labs (From admission, onward)     Start     Ordered   04/19/24 0500  Creatinine, serum  (enoxaparin  (LOVENOX )  CrCl >/= 30 mL/min  )  Weekly,   R     Comments: while on enoxaparin  therapy.    04/12/24  2250   04/14/24 0500  VITAMIN D  25 Hydroxy (Vit-D Deficiency, Fractures)  Tomorrow morning,   R        04/13/24 0904   04/13/24 1306  Basic metabolic panel  Add-on,   AD        04/13/24 1305   04/13/24 0500  CBC  Daily,   R     Comments: For 3 days.    04/12/24 2250   04/13/24 0500  Basic metabolic panel  Daily,   R     Comments: For 2 days .    04/12/24 2250   Pending  Prepare RBC (crossmatch)  (Blood Administration Adult)  Once,   R       Question Answer Comment  # of Units 2 units   Transfusion Indications Hemoglobin < 7 gm/dL and symptomatic   Number of Units to Keep Ahead NO units ahead     Placed in And Linked Group   Pending   Pending  Prepare fresh frozen plasma  (Blood Administration Adult)  Once,   R       Question Answer Comment  Number of Units 1   Transfusion Indications Surgery     Placed in And Linked Group   Pending           Antimicrobials/Microbiology: Anti-infectives (From admission, onward)    Start     Dose/Rate Route Frequency Ordered Stop   04/13/24 0000  ceFAZolin  (ANCEF ) IVPB 2g/100 mL premix        2 g 200 mL/hr over 30 Minutes Intravenous Every 6 hours 04/12/24 2250 04/13/24 0540   04/12/24 0930  ceFAZolin  (ANCEF ) IVPB 2g/100 mL premix        2 g 200 mL/hr over 30 Minutes Intravenous On call to O.R. 04/12/24 9164 04/12/24 1814  Component Value Date/Time   SDES BLOOD SITE NOT SPECIFIED 02/07/2024 2213   SPECREQUEST  02/07/2024 2213    BOTTLES DRAWN AEROBIC AND ANAEROBIC Blood Culture adequate volume   CULT  02/07/2024 2213    NO GROWTH 5 DAYS Performed at [facility], [address] [place], [place] [address]    REPTSTATUS 02/12/2024 FINAL 02/07/2024 2213    Procedures: Procedures (LRB): OPEN REDUCTION INTERNAL FIXATION FEMUR FRACTURE (Left)   Mennie LAMY, MD [facility] 04/13/2024, 1:05 PM   "

## 2024-04-13 NOTE — Consult Note (Signed)
 "  [facility]  [phone]   HEMATOLOGY/ONCOLOGY IN-PATIENT CONSULTATION NOTE   PATIENT NAME: Dennis Wise   MR#: [id number] DOB: Apr 20, 1951 CSN#: [id number]   DATE OF SERVICE: 04/13/2024  Requesting Physician: [facility]   Patient Care Team: Dennis Arlyss RAMAN, MD as PCP - General (Family Medicine) Dennis Sal Dickinson, MD as Referring Physician (Ophthalmology) Dennis Potts, MD as Consulting Physician (Hematology and Oncology) Dennis Call, MD as Consulting Physician (Oncology) Dennis Wise (Neurology)  REASON FOR CONSULTATION:  Anemia and thrombocytopenia  ASSESSMENT & PLAN:  73 y.o. male  with medical history significant for CVA on Plavix , anemia of chronic disease and iron  deficiency, chronic thrombocytopenia, CKD stage IIIa, HTN, HLD, T2DM, GAD, s/p left elbow ORIF 02/08/2024 and left THA 02/10/2024 who presented to the ED on 04/11/2024 for evaluation of left hip pain after fall at home.   Thrombocytopenia secondary to splenomegaly Chronic thrombocytopenia due to hypersplenism, with splenic sequestration of platelets. Platelet count remains above 50,000 without definite active bleeding. Extensive prior evaluation, including bone marrow biopsies, excluded alternative etiologies. Frequent blood product transfusions increase risk of alloimmunization and future transfusion reactions, complicating transfusion support. - Set platelet transfusion threshold at <50,000/?L unless active bleeding. - Please avoid unnecessary platelet transfusions to minimize alloimmunization and transfusion reaction risk. - Monitor platelet counts and assess for bleeding.  Chronic anemia Chronic anemia with hemoglobin maintained above 7 g/dL, presumed secondary to splenic sequestration. Extensive evaluation previously has not identified an alternative cause. Multiple prior red blood cell transfusions increase risk of transfusion reactions and alloimmunization. -  Please set red blood cell transfusion threshold at hemoglobin <7 g/dL unless symptomatic, at which time, goal would be to maintain hemoglobin above 8.  He underwent open reduction and internal fixation of left femoral shaft fracture and application of large wound VAC on 04/12/2024.  He was also found to have a left gluteal hematoma and periprosthetic femur fracture.   Okay to proceed with FFP's in an attempt to minimize bruising/bleeding.   Response to transfusion can be suboptimal in his case because of ongoing splenic sequestration.  Discussed plan of care with Dennis Wise, hospitalist and Orthopedics team.   Rest of care as per primary team and other specialties.  Thanks for the opportunity to participate in the care of this patient. Please contact me if there are any questions.  Dennis Patten, MD Medical Oncology and Hematology 04/13/2024 2:42 PM   HISTORY OF PRESENT ILLNESS:  Dennis Wise is a 73 y.o. male  with medical history significant for CVA on Plavix , anemia of chronic disease and iron  deficiency, chronic thrombocytopenia, CKD stage IIIa, HTN, HLD, T2DM, GAD, s/p left elbow ORIF 02/08/2024 and left THA 02/10/2024 who presented to the ED on 04/11/2024 for evaluation of left hip pain after fall at home.   On arrival, labs showed WBC 5.4, hemoglobin 8.0, platelets 94, sodium 136, potassium 4.9, bicarb 25, BUN 39, creatinine 1.40, serum glucose 197.   Left hip x-ray showed mildly displaced oblique/spiral periprosthetic fracture about the femoral stem.   Left knee x-ray again showed periprosthetic fracture about the femoral stem of hip arthroplasty, partially included.  Surgical fixation of the patella, one of the K wires is broken.   Patient was given IV morphine  4 mg x 2, IV Zofran .  EDP discussed with [facility] covering for Dr. Kendal Dennis Moores, PA, who recommended medical admission, n.p.o. at midnight for possible surgical fixation on 04/12/2024.  The hospitalist  service was consulted  for admission.  Today we were consulted for evaluation of anemia and thrombocytopenia.  On review of records, patient has had extensive workup over the direction of Dr. Babara, in [facility] hematology office.  Thrombocytopenia was felt to be from splenomegaly.  He did have monoclonal B-cell lymphocytosis.  Previous workup including bone marrow biopsy was unyielding otherwise.  INTERVAL HISTORY:  Patient seen and examined.  He is hard of hearing.  Family member was by the bedside and contributed to the history.  Per them, no active bleeding currently.  MEDICAL HISTORY Past Medical History:  Diagnosis Date   Actinic keratosis 10/16/2013   Bilateral carotid artery stenosis 09/23/2021   Less than 50% bilaterally 2023   Cervical stenosis of spinal canal    Combined form of senile cataract of both eyes 05/02/2016   Diabetic retinopathy 03/21/2005   Dupuytren contracture 03/03/2019   Essential hypertension 11/20/1999   Last Assessment & Plan: Change to 5mg  ramipril  and see if the lightheaded troubles get better.  He'll update me as needed.   [name]    Generalized anxiety disorder 10/01/2022   GERD (gastroesophageal reflux disease) 01/1989   Hearing loss 10/20/2014   Helicobacter pylori gastritis 06/11/2011   On EGD 05/2011    Hemorrhagic disorder due to circulating anticoagulants 09/27/2022   History of colonic polyps 06/05/2012   06/05/2011 - 12 polyps max 7 mm, at least 9 adenomas  04/07/2013 - no polyps - repeat colonoscopy 2019 Dennis CHARLENA Commander, MD, [place]        IMO SNOMED Dx Update Oct 2024     Hyperlipidemia    Internal hemorrhoids    Iron  deficiency anemia, unspecified 03/04/2011   Ischemic right PCA stroke 09/05/2022   Left homonymous hemianopsia 08/2022   Caused by right PCA stroke   MGUS (monoclonal gammopathy of unknown significance)    possible dx initially, had negative f/u   Mild vascular neurocognitive disorder 05/07/2023   Monoclonal B-cell  lymphocytosis 01/15/2022   NAFLD (nonalcoholic fatty liver disease)    Nocturia 08/18/2021   Normocytic anemia 04/03/2021   Obesity (BMI 30-39.9) 10/16/2013   Pain in joint of left knee 09/27/2022   Pancytopenia, acquired 05/01/2011   Pneumonia    Pseudophakia, both eyes 07/17/2016   Pure hypercholesterolemia 04/21/1988   Last Assessment & Plan: Continue statin, continue work on diet, d/w pt about labs.  He agrees.   Shoulder pain 07/02/2022   Syncope 11/22/2021   Thrombocytopenia 05/03/2021   Tibial fracture 10/24/2010   L tibia.  Repair 2012.    L tibia.  Repair 2012.  Last Assessment & Plan:  Per ortho.     Type II diabetes mellitus 04/21/1988   With h/o dec in sensation on feet     Unstable ankle 09/12/2020     SURGICAL HISTORY Past Surgical History:  Procedure Laterality Date   CATARACT EXTRACTION Bilateral    CERVICAL FUSION  01/05/2024   C2-T2   COLONOSCOPY     ESOPHAGOGASTRODUODENOSCOPY     2012 H. pylori gastritis   FLEXIBLE SIGMOIDOSCOPY  05/1988   internal hemorrhoids   KNEE SURGERY  12/2002   lt knee fx repair ORIF   ORIF ELBOW FRACTURE Left 02/08/2024   Procedure: OPEN REDUCTION INTERNAL FIXATION (ORIF) ELBOW/OLECRANON FRACTURE;  Surgeon: Celena Sharper, MD;  Location: [facility] OR;  Service: Orthopedics;  Laterality: Left;   ORIF FEMUR FRACTURE Left 04/12/2024   Procedure: OPEN REDUCTION INTERNAL FIXATION FEMUR FRACTURE;  Surgeon: Celena Sharper, MD;  Location: [facility] OR;  Service:  Orthopedics;  Laterality: Left;   TIBIA FRACTURE SURGERY     left 2012   TOTAL HIP ARTHROPLASTY Left 02/10/2024   Procedure: ARTHROPLASTY, HIP, TOTAL,POSTERIOR APPROACH;  Surgeon: Edna Toribio LABOR, MD;  Location: [facility] ORS;  Service: Orthopedics;  Laterality: Left;     ALLERGIES  Allergies[1]  FAMILY HISTORY  Family History  Problem Relation Age of Onset   Cancer Mother        ?   Diabetes Mother    Heart failure Mother    Emphysema Father    Cancer Father        ?    Alzheimer's disease Father    Dementia Father    Skin cancer Brother    Diabetes Brother    Colon cancer Neg Hx    Prostate cancer Neg Hx    Esophageal cancer Neg Hx    Rectal cancer Neg Hx    Stomach cancer Neg Hx      SOCIAL HISTORY   Social History   Socioeconomic History   Marital status: Married    Spouse name: [name]   Number of children: 1   Years of education: 12   Highest education level: 12th grade  Occupational History   Occupation: Audio engineer-retired  Tobacco Use   Smoking status: Never   Smokeless tobacco: Never  Vaping Use   Vaping status: Never Used  Substance and Sexual Activity   Alcohol  use: Never   Drug use: No   Sexual activity: Yes    Birth control/protection: None  Other Topics Concern   Not on file  Social History Narrative   Prev traveling for sports broadcasting    Working for [employer], [employer] etc as of 2019   Married 1970   1 child   [employer]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   Right handed   One floor home   Drinks caffeine   Lives with wife   Social Drivers of Health   Tobacco Use: Low Risk (04/12/2024)   Patient History    Smoking Tobacco Use: Never    Smokeless Tobacco Use: Never    Passive Exposure: Not on file  Financial Resource Strain: Low Risk (02/03/2024)   Overall Financial Resource Strain (CARDIA)    Difficulty of Paying Living Expenses: Not hard at all  Food Insecurity: No Food Insecurity (04/12/2024)   Epic    Worried About Radiation Protection Practitioner of Food in the Last Year: Never true    Ran Out of Food in the Last Year: Never true  Transportation Needs: No Transportation Needs (04/12/2024)   Epic    Lack of Transportation (Medical): No    Lack of Transportation (Non-Medical): No  Physical Activity: Inactive (02/03/2024)   Exercise Vital Sign    Days of Exercise per Week: 0 days    Minutes of Exercise per Session: Not on file  Stress: No Stress Concern Present (02/03/2024)   [employer] of Occupational Health - Occupational Stress  Questionnaire    Feeling of Stress: Not at all  Social Connections: Socially Integrated (04/12/2024)   Social Connection and Isolation Panel    Frequency of Communication with Friends and Family: Three times a week    Frequency of Social Gatherings with Friends and Family: Once a week    Attends Religious Services: More than 4 times per year    Active Member of [employer] or Organizations: Yes    Attends Engineer, Structural: More than 4 times per year    Marital Status: Married  Catering Manager  Violence: Not At Risk (04/12/2024)   Epic    Fear of Current or Ex-Partner: No    Emotionally Abused: No    Physically Abused: No    Sexually Abused: No  Depression (PHQ2-9): Low Risk (03/24/2024)   Depression (PHQ2-9)    PHQ-2 Score: 0  Alcohol  Screen: Low Risk (05/05/2023)   Alcohol  Screen    Last Alcohol  Screening Score (AUDIT): 0  Housing: Low Risk (04/12/2024)   Epic    Unable to Pay for Housing in the Last Year: No    Number of Times Moved in the Last Year: 0    Homeless in the Last Year: No  Utilities: Not At Risk (04/12/2024)   Epic    Threatened with loss of utilities: No  Health Literacy: Adequate Health Literacy (05/05/2023)   B1300 Health Literacy    Frequency of need for help with medical instructions: Never    CURRENT MEDICATIONS   Current Outpatient Medications  Medication Instructions   acetaminophen  (TYLENOL ) 650 mg, Oral, Every 4 hours PRN   atorvastatin  (LIPITOR) 10 mg, Oral, Daily at bedtime   clopidogrel  (PLAVIX ) 75 mg, Oral, Daily   escitalopram  (LEXAPRO ) 10 mg, Oral, Daily   glimepiride  (AMARYL ) 2 mg, Oral, Daily with breakfast   hydrochlorothiazide  (HYDRODIURIL ) 12.5 mg, Oral, Daily   IRON -VITAMIN C  PO 1 tablet, Every morning   losartan  (COZAAR ) 100 mg, Oral, Daily   memantine  (NAMENDA ) 5 mg, Oral, 2 times daily   metFORMIN  (GLUCOPHAGE ) 850 mg, Oral, 2 times daily   Multiple Vitamin (MULTIVITAMIN) tablet 1 tablet, Daily at bedtime   niacin  (VITAMIN  B3) 500 mg, Oral, 2 times daily   pioglitazone  (ACTOS ) 45 mg, Oral, Daily   tamsulosin  (FLOMAX ) 0.4 mg, Oral, Daily at bedtime     REVIEW OF SYSTEMS   Review of Systems - Oncology  All other pertinent review of systems is negative except as mentioned above in HPI  PHYSICAL EXAMINATION   Vitals:   04/13/24 1303 04/13/24 1328  BP: (!) 119/43 (!) 126/40  Pulse: 67 66  Resp: 17 18  Temp: 98.4 F (36.9 C) 98.5 F (36.9 C)  SpO2: 100% 100%   Filed Weights   04/11/24 2034 04/12/24 1143  Weight: 270 lb (122.5 kg) 242 lb 15.2 oz (110.2 kg)    Physical Exam Cardiovascular:     Heart sounds: Normal heart sounds.  Pulmonary:     Breath sounds: Normal breath sounds.  Musculoskeletal:     Comments:  left hip w/ dressing in place, wound VAC in place  Neurological:     Mental Status: He is alert.     LABORATORY DATA:   I have reviewed the data as listed  Results for orders placed or performed during the hospital encounter of 04/11/24 (from the past 24 hours)  POCT I-Stat EG7   Collection Time: 04/12/24  2:46 PM  Result Value Ref Range   pH, Ven 7.337 7.25 - 7.43   pCO2, Ven 47.8 44 - 60 mmHg   pO2, Ven 54 (H) 32 - 45 mmHg   Bicarbonate 25.7 20.0 - 28.0 mmol/L   TCO2 27 22 - 32 mmol/L   O2 Saturation 86 %   Acid-Base Excess 0.0 0.0 - 2.0 mmol/L   Sodium 138 135 - 145 mmol/L   Potassium 4.5 3.5 - 5.1 mmol/L   Calcium , Ion 1.24 1.15 - 1.40 mmol/L   HCT 23.0 (L) 39.0 - 52.0 %   Hemoglobin 7.8 (L) 13.0 - 17.0 g/dL   Patient  temperature 36.5 C    Sample type VENOUS   Prepare RBC (crossmatch) INTRAOP ONLY   Collection Time: 04/12/24  3:14 PM  Result Value Ref Range   Order Confirmation      ORDER PROCESSED BY BLOOD BANK Performed at [facility], [address], [place], [place] [address]   POCT I-Stat EG7   Collection Time: 04/12/24  4:26 PM  Result Value Ref Range   pH, Ven 7.315 7.25 - 7.43   pCO2, Ven 46.0 44 - 60 mmHg   pO2, Ven 39 32 - 45 mmHg    Bicarbonate 23.5 20.0 - 28.0 mmol/L   TCO2 25 22 - 32 mmol/L   O2 Saturation 69 %   Acid-base deficit 3.0 (H) 0.0 - 2.0 mmol/L   Sodium 138 135 - 145 mmol/L   Potassium 4.3 3.5 - 5.1 mmol/L   Calcium , Ion 1.16 1.15 - 1.40 mmol/L   HCT 21.0 (L) 39.0 - 52.0 %   Hemoglobin 7.1 (L) 13.0 - 17.0 g/dL   Patient temperature 63.0 C    Sample type VENOUS   Prepare platelet pheresis   Collection Time: 04/12/24  4:35 PM  Result Value Ref Range   Unit Number [id number]    Blood Component Type PLTP3 PSORALEN TREATED    Unit division 00    Status of Unit ISSUED,FINAL    Transfusion Status      OK TO TRANSFUSE Performed at [facility], [address], [place], [place] [address]   BPAM Platelet Pheresis   Collection Time: 04/12/24  4:35 PM  Result Value Ref Range   ISSUE DATE / TIME 797487768353    Blood Product Unit Number [id number]    PRODUCT CODE Z1656C99    Unit Type and Rh 7300    Blood Product Expiration Date 797487757640   Prepare fresh frozen plasma   Collection Time: 04/12/24  4:36 PM  Result Value Ref Range   Unit Number [id number]    Blood Component Type THW PLS APHR    Unit division B0    Status of Unit ISSUED,FINAL    Transfusion Status      OK TO TRANSFUSE Performed at [facility], [address] [place], [place] [address]   BPAM [facility]   Collection Time: 04/12/24  4:36 PM  Result Value Ref Range   ISSUE DATE / TIME 797487768285    Blood Product Unit Number [id number]    PRODUCT CODE [id number]    Unit Type and Rh 7300    Blood Product Expiration Date 797487717640   Glucose, capillary   Collection Time: 04/12/24  7:20 PM  Result Value Ref Range   Glucose-Capillary 239 (H) 70 - 99 mg/dL  CBC   Collection Time: 04/12/24  7:33 PM  Result Value Ref Range   WBC 7.9 4.0 - 10.5 K/uL   RBC 2.51 (L) 4.22 - 5.81 MIL/uL   Hemoglobin 7.8 (L) 13.0 - 17.0 g/dL   HCT 77.5 (L) 60.9 - 47.9 %   MCV 89.2 80.0 - 100.0 fL   MCH 31.1 26.0 - 34.0 pg   MCHC  34.8 30.0 - 36.0 g/dL   RDW 83.6 (H) 88.4 - 84.4 %   Platelets 100 (L) 150 - 400 K/uL   nRBC 0.0 0.0 - 0.2 %  Glucose, capillary   Collection Time: 04/12/24  9:35 PM  Result Value Ref Range   Glucose-Capillary 215 (H) 70 - 99 mg/dL  Prepare RBC (crossmatch)   Collection Time:  04/12/24 10:59 PM  Result Value Ref Range   Order Confirmation      ORDER PROCESSED BY BLOOD BANK Performed at [facility], [address], [place], [place] [address]   CBC   Collection Time: 04/12/24 11:09 PM  Result Value Ref Range   WBC 6.9 4.0 - 10.5 K/uL   RBC 2.19 (L) 4.22 - 5.81 MIL/uL   Hemoglobin 6.7 (LL) 13.0 - 17.0 g/dL   HCT 80.6 (L) 60.9 - 47.9 %   MCV 88.1 80.0 - 100.0 fL   MCH 30.6 26.0 - 34.0 pg   MCHC 34.7 30.0 - 36.0 g/dL   RDW 83.2 (H) 88.4 - 84.4 %   Platelets 85 (L) 150 - 400 K/uL   nRBC 0.0 0.0 - 0.2 %  Basic metabolic panel   Collection Time: 04/12/24 11:09 PM  Result Value Ref Range   Sodium 138 135 - 145 mmol/L   Potassium 5.2 (H) 3.5 - 5.1 mmol/L   Chloride 105 98 - 111 mmol/L   CO2 23 22 - 32 mmol/L   Glucose, Bld 250 (H) 70 - 99 mg/dL   BUN 38 (H) 8 - 23 mg/dL   Creatinine, Ser 8.41 (H) 0.61 - 1.24 mg/dL   Calcium  8.0 (L) 8.9 - 10.3 mg/dL   GFR, Estimated 45 (L) >60 mL/min   Anion gap 10 5 - 15  Glucose, capillary   Collection Time: 04/13/24  8:07 AM  Result Value Ref Range   Glucose-Capillary 258 (H) 70 - 99 mg/dL  CBC with Differential/Platelet   Collection Time: 04/13/24 10:24 AM  Result Value Ref Range   WBC 8.5 4.0 - 10.5 K/uL   RBC 2.50 (L) 4.22 - 5.81 MIL/uL   Hemoglobin 7.6 (L) 13.0 - 17.0 g/dL   HCT 78.4 (L) 60.9 - 47.9 %   MCV 86.0 80.0 - 100.0 fL   MCH 30.4 26.0 - 34.0 pg   MCHC 35.3 30.0 - 36.0 g/dL   RDW 83.5 (H) 88.4 - 84.4 %   Platelets 77 (L) 150 - 400 K/uL   nRBC 0.0 0.0 - 0.2 %   Neutrophils Relative % 76 %   Neutro Abs 6.5 1.7 - 7.7 K/uL   Lymphocytes Relative 9 %   Lymphs Abs 0.8 0.7 - 4.0 K/uL   Monocytes Relative 13 %    Monocytes Absolute 1.1 (H) 0.1 - 1.0 K/uL   Eosinophils Relative 0 %   Eosinophils Absolute 0.0 0.0 - 0.5 K/uL   Basophils Relative 0 %   Basophils Absolute 0.0 0.0 - 0.1 K/uL   Immature Granulocytes 2 %   Abs Immature Granulocytes 0.15 (H) 0.00 - 0.07 K/uL  Prepare RBC (crossmatch)   Collection Time: 04/13/24 11:13 AM  Result Value Ref Range   Order Confirmation      ORDER PROCESSED BY BLOOD BANK Performed at [facility], [address] [place], [place] [address]   Prepare platelet pheresis   Collection Time: 04/13/24 11:14 AM  Result Value Ref Range   Unit Number [id number]    Blood Component Type PLTP1 PSORALEN TREATED    Unit division 00    Status of Unit ALLOCATED    Transfusion Status OK TO TRANSFUSE   Prepare fresh frozen plasma   Collection Time: 04/13/24 11:14 AM  Result Value Ref Range   Unit Number [id number]    Blood Component Type THW PLS APHR    Unit division 00    Status of Unit ALLOCATED  Transfusion Status OK TO TRANSFUSE   BPAM Platelet Pheresis   Collection Time: 04/13/24 11:14 AM  Result Value Ref Range   Blood Product Unit Number [id number]    PRODUCT CODE [id number]    Unit Type and Rh 6200    Blood Product Expiration Date 202512252359   BPAM FFP   Collection Time: 04/13/24 11:14 AM  Result Value Ref Range   Blood Product Unit Number [id number]    PRODUCT CODE [id number]    Unit Type and Rh 7300    Blood Product Expiration Date 797487707640   Glucose, capillary   Collection Time: 04/13/24 12:00 PM  Result Value Ref Range   Glucose-Capillary 197 (H) 70 - 99 mg/dL  Glucose, capillary   Collection Time: 04/13/24  2:30 PM  Result Value Ref Range   Glucose-Capillary 197 (H) 70 - 99 mg/dL      RADIOGRAPHIC STUDIES:  I have personally reviewed the radiological images as listed and agree with the findings in the report.  DG FEMUR PORT MIN 2 VIEWS LEFT Result Date: 04/12/2024 CLINICAL DATA:  Fracture, postop. EXAM: LEFT FEMUR  PORTABLE 2 VIEWS COMPARISON:  Preoperative imaging FINDINGS: Lateral plate and screw fixation of periprosthetic femur fracture. Decreased fracture displacement from preoperative imaging. Previous hip arthroplasty with cerclage wire fixation. Previous hardware within the patella and proximal tibia. Recent postsurgical change includes air and edema in the soft tissues. IMPRESSION: ORIF of periprosthetic femur fracture. Decreased fracture displacement from preoperative imaging. Electronically Signed   By: Andrea Gasman M.D.   On: 04/12/2024 20:36   DG FEMUR MIN 2 VIEWS LEFT Result Date: 04/12/2024 CLINICAL DATA:  Elective surgery. EXAM: LEFT FEMUR 2 VIEWS COMPARISON:  Preoperative imaging FINDINGS: Twelve fluoroscopic spot views of the left femur submitted from the operating room. Plate and screw fixation of periprosthetic fracture adjacent to femoral stem of hip arthroplasty. Previous surgical fixation of the patella and proximal tibia. Fluoroscopy time 51.5 seconds. Dose 9.84 mGy. IMPRESSION: Intraoperative fluoroscopy during ORIF of femur fracture. Electronically Signed   By: Andrea Gasman M.D.   On: 04/12/2024 20:35   DG C-Arm 1-60 Min-No Report Result Date: 04/12/2024 Fluoroscopy was utilized by the requesting physician.  No radiographic interpretation.   DG C-Arm 1-60 Min-No Report Result Date: 04/12/2024 Fluoroscopy was utilized by the requesting physician.  No radiographic interpretation.   DG C-Arm 1-60 Min-No Report Result Date: 04/12/2024 Fluoroscopy was utilized by the requesting physician.  No radiographic interpretation.   DG C-Arm 1-60 Min-No Report Result Date: 04/12/2024 Fluoroscopy was utilized by the requesting physician.  No radiographic interpretation.   CT FEMUR LEFT WO CONTRAST Result Date: 04/12/2024 EXAM: CT OF THE LEFT FEMUR, WITHOUT IV CONTRAST 04/12/2024 06:01:52 AM TECHNIQUE: Axial images were acquired through the left femur without IV contrast. Reformatted  images were reviewed. Automated exposure control, iterative reconstruction, and/or weight based adjustment of the mA/kV was utilized to reduce the radiation dose to as low as reasonably achievable. COMPARISON: Left hip and left knee films from yesterday. CLINICAL HISTORY: Fracture, femur Fracture, femur FINDINGS: BONES: There is spray artifact from a left hip replacement with cerclage wiring, and lateral side plate ORIF hardware in the proximal tibia as well as cerclage wiring with healed fracture of the patella. There is an acute spiral oblique perihardware proximal to mid femoral shaft fracture at the level of the distal third of the femoral stem. There is a sizable butterfly combination fragment posteriorly at the level of the fracture which is displaced posteriorly by about 2  widths of the cortex. The main distal fracture fragment is displaced anteriorly and laterally by one third of the shaft's width and is also rotated laterally, almost at a right angle to the proximal fragment. Bridging osteophytes anterior left SI joint. The visualized portions of the left hemipelvis are unremarkable. There is no further evidence of fractures. JOINTS: No dislocation. There is a low density suprapatellar bursal effusion at the knee. There is mild arthrosis of the knee. No hip joint effusion. SOFT TISSUES: Although not well seen due to metal artifact, there is suspected to be some swelling in the anterior thigh compartment muscles. Underlying hematoma is not excluded. There are moderate calcified plaques in the left common femoral and superficial femoral arteries and in the popliteal and proximal popliteal trifurcation arteries. IMPRESSION: 1. Acute spiral oblique perihardware proximal to mid femoral shaft fracture at the level of the distal third of the femoral stem, with a sizable posterior butterfly fragment displaced posteriorly by about 2 cortex widths, and with the main distal fragment displaced anteriorly and laterally  by one third shaft width and rotated laterally nearly 90 degrees relative to the proximal fragment. 2. Suspected anterior thigh compartment muscle swelling, with underlying hematoma not excluded. 3. Suprapatellar bursal effusion at the knee, without hip joint effusion. Electronically signed by: Francis Quam MD 04/12/2024 06:24 AM EST RP Workstation: [id number]   DG Knee Complete 4 Views Left Result Date: 04/11/2024 CLINICAL DATA:  Status post fall with left hip pain. EXAM: LEFT KNEE - COMPLETE 4+ VIEW COMPARISON:  None Available. FINDINGS: Periprosthetic fracture about the femoral stem of hip arthroplasty, partially included in the field of view. Evaluation of the knee is limited due to difficulties with positioning. No obvious additional fracture. Two K-wires traverse the patella, 1 of which is broken. Plate and screw fixation of the proximal lateral tibia. No joint effusion. Mild generalized soft tissue edema. IMPRESSION: 1. Periprosthetic fracture about the femoral stem of hip arthroplasty, partially included. 2. Surgical fixation of the patella, 1 of the K-wires is broken. 3. Allowing for difficulty in limitations with positioning, no additional acute fracture of the knee. Electronically Signed   By: Andrea Gasman M.D.   On: 04/11/2024 21:38   DG Hip Unilat W or Wo Pelvis 2-3 Views Left Result Date: 04/11/2024 CLINICAL DATA:  Status post fall, left hip pain. EXAM: DG HIP (WITH OR WITHOUT PELVIS) 2-3V LEFT COMPARISON:  None Available. FINDINGS: Left hip arthroplasty in place. Mildly displaced oblique/spiral fracture about the femoral stem. The femoral component is seated in the acetabular component. No pelvic fracture. Pubic rami are intact. IMPRESSION: Mildly displaced oblique/spiral periprosthetic fracture about the femoral stem. Electronically Signed   By: Andrea Gasman M.D.   On: 04/11/2024 21:36     This document was completed utilizing speech recognition software. Grammatical errors,  random word insertions, pronoun errors, and incomplete sentences are an occasional consequence of this system due to software limitations, ambient noise, and hardware issues. Any formal questions or concerns about the content, text or information contained within the body of this dictation should be directly addressed to the provider for clarification.      [1]  Allergies Allergen Reactions   Hydralazine      Short of breath.     Promethazine Hcl     AGITIATION   "

## 2024-04-13 NOTE — Anesthesia Postprocedure Evaluation (Signed)
"   Anesthesia Post Note  Patient: Dennis Wise  Procedure(s) Performed: OPEN REDUCTION INTERNAL FIXATION FEMUR FRACTURE (Left: Leg Upper)     Patient location during evaluation: Nursing Unit Anesthesia Type: General Level of consciousness: patient cooperative Pain management: pain level controlled Vital Signs Assessment: post-procedure vital signs reviewed and stable Respiratory status: spontaneous breathing, nonlabored ventilation and respiratory function stable Cardiovascular status: blood pressure returned to baseline and stable Postop Assessment: no apparent nausea or vomiting Anesthetic complications: no   No notable events documented.  Last Vitals:  Vitals:   04/12/24 2338 04/13/24 0000  BP: (!) 111/37 (!) 105/37  Pulse: 80 83  Resp: 18 18  Temp: 36.9 C 36.8 C  SpO2: 100% 100%    Last Pain:  Vitals:   04/13/24 0000  TempSrc: Oral  PainSc:                  [name]      "

## 2024-04-13 NOTE — Plan of Care (Signed)
  Problem: Pain Managment: Goal: General experience of comfort will improve and/or be controlled Outcome: Progressing   Problem: Safety: Goal: Ability to remain free from injury will improve Outcome: Progressing   Problem: Skin Integrity: Goal: Risk for impaired skin integrity will decrease Outcome: Progressing

## 2024-04-13 NOTE — Evaluation (Signed)
 Occupational Therapy Evaluation Patient Details Name: Dennis Wise MRN: [id number] DOB: 12-05-1950 Today's Date: 04/13/2024   History of Present Illness   73 yo M adm 12/22 due to fall with left periprosthetic femur fracture around long hip stem s/p ORIF L femur. PMH 01/2024 fall with L THA and ORIF L elbow, NAFLD, ischemc R PCA stroke (2024), HTN, DM2, hearing loss, C2-T2 fusion (12/2023).     Clinical Impressions Pt recently discharged home from [facility] after receiving rehab from previous fall. Since DC home, wife has been assisting with ADL tasks and pt has been using a PFRW for limited mobility. Attempted mobilizing EOB however with attempts at moving, pt yelling loudly and asking therapists to stop. +2 total A with rolling to place clean bed pad and overall total A with ADL tasks due to below listed deficits. Recommend call bell and table be placed on R side due to L visual field cut and L inattention. Recommend Prevalon boot L foot to reduce risk of pressure  as pt is not moving LLE. Patient will benefit from continued inpatient follow up therapy, <3 hours/day. Acute OT to follow.      If plan is discharge home, recommend the following:    (NA)     Functional Status Assessment   Patient has had a recent decline in their functional status and/or demonstrates limited ability to make significant improvements in function in a reasonable and predictable amount of time     Equipment Recommendations   None recommended by OT     Recommendations for Other Services         Precautions/Restrictions   Precautions Precautions: Fall;Posterior Hip Precaution Booklet Issued: Yes (comment) Recall of Precautions/Restrictions: Impaired Precaution/Restrictions Comments: Visual deficits ( L field cut), wound vac Restrictions Weight Bearing Restrictions Per Provider Order: Yes LLE Weight Bearing Per Provider Order: Non weight bearing     Mobility Bed Mobility Overal bed  mobility: Needs Assistance Bed Mobility: Rolling Rolling: Total assist, +2 for physical assistance         General bed mobility comments: Attempted to transition to left side of bed, but pt unable to tolerate LLE hanging against gravity, even with therapist support. Returned to supine. +2-3 to boost up in bed. Rolled to R/L for placement of bed pad with totalA +2. Bed placed in chair position    Transfers                   General transfer comment: Pt unable      Balance Overall balance assessment: History of Falls (multiple falls)                                         ADL either performed or assessed with clinical judgement   ADL Overall ADL's : Needs assistance/impaired Eating/Feeding: Set up;Sitting   Grooming: Set up;Supervision/safety;Sitting;Bed level   Upper Body Bathing: Minimal assistance;Bed level   Lower Body Bathing: Total assistance;Bed level   Upper Body Dressing : Moderate assistance   Lower Body Dressing: Total assistance               Functional mobility during ADLs:  (unable to progress OOB this session)       Vision Baseline Vision/History: 1 Wears glasses Ability to See in Adequate Light: 1 Impaired Patient Visual Report: Peripheral vision impairment (L HH) Vision Assessment?: Vision impaired- to be further tested in  functional context     Perception         Praxis         Pertinent Vitals/Pain Pain Assessment Pain Assessment: Faces Faces Pain Scale: Hurts worst Pain Location: L hip Pain Descriptors / Indicators: Other (Comment), Operative site guarding, Grimacing, Sharp (yelling) Pain Intervention(s): Limited activity within patient's tolerance, Patient requesting pain meds-RN notified, Repositioned     Extremity/Trunk Assessment Upper Extremity Assessment Upper Extremity Assessment: LUE deficits/detail;Right hand dominant LUE Deficits / Details: recent L elbow fx; no orders in chart; limited use of  LUE at this time. Unable to reach to bedside table with LUE LUE Coordination: decreased gross motor   Lower Extremity Assessment Lower Extremity Assessment: Defer to PT evaluation RLE Deficits / Details: Able to perform heel slide, at least 3/5 strength LLE Deficits / Details: No active movement of hip/knee, limited due to pain       Communication Communication Communication: Impaired Factors Affecting Communication: Hearing impaired   Cognition Arousal: Alert Behavior During Therapy: WFL for tasks assessed/performed, Restless Cognition: No family/caregiver present to determine baseline, Cognition impaired   Orientation impairments: Place Awareness: Online awareness impaired Memory impairment (select all impairments): Working civil service fast streamer, Conservation officer, historic buildings Attention impairment (select first level of impairment): Selective attention Executive functioning impairment (select all impairments): Problem solving, Reasoning                   Following commands: Impaired Following commands impaired: Follows one step commands inconsistently, Follows one step commands with increased time     Cueing  General Comments   Cueing Techniques: Verbal cues      Exercises     Shoulder Instructions      Home Living Family/patient expects to be discharged to:: Skilled nursing facility Living Arrangements: Spouse/significant other Available Help at Discharge: Family Type of Home: House Home Access: Stairs to enter Secretary/administrator of Steps: 2 Entrance Stairs-Rails: Left Home Layout: One level     Bathroom Shower/Tub: Producer, Television/film/video: Handicapped height     Home Equipment: Pharmacist, Hospital (2 wheels);Grab bars - toilet;Hand held shower head          Prior Functioning/Environment Prior Level of Function : Needs assist             Mobility Comments: LUE platform RW, spending a lot of time in lift chair ADLs Comments: Requiring  assist since recent d/c one week ago from [name]    OT Problem List: Decreased strength;Decreased range of motion;Decreased activity tolerance;Impaired balance (sitting and/or standing);Impaired vision/perception;Decreased coordination;Decreased cognition;Decreased safety awareness;Decreased knowledge of use of DME or AE;Decreased knowledge of precautions;Impaired UE functional use;Pain   OT Treatment/Interventions: Self-care/ADL training;Therapeutic exercise;DME and/or AE instruction;Therapeutic activities;Cognitive remediation/compensation;Patient/family education;Balance training      OT Goals(Current goals can be found in the care plan section)   Acute Rehab OT Goals Patient Stated Goal: none stated OT Goal Formulation: With patient Time For Goal Achievement: 04/27/24 Potential to Achieve Goals: Fair   OT Frequency:  Min 1X/week    Co-evaluation PT/OT/SLP Co-Evaluation/Treatment: Yes Reason for Co-Treatment: Complexity of the patient's impairments (multi-system involvement);Necessary to address cognition/behavior during functional activity;For patient/therapist safety;To address functional/ADL transfers PT goals addressed during session: Mobility/safety with mobility OT goals addressed during session: ADL's and self-care      AM-PAC OT 6 Clicks Daily Activity     Outcome Measure Help from another person eating meals?: A Little Help from another person taking care of personal grooming?: A Little  Help from another person toileting, which includes using toliet, bedpan, or urinal?: Total Help from another person bathing (including washing, rinsing, drying)?: Total Help from another person to put on and taking off regular upper body clothing?: A Lot Help from another person to put on and taking off regular lower body clothing?: Total 6 Click Score: 11   End of Session Equipment Utilized During Treatment: Oxygen (3L) Nurse Communication: Mobility status;Weight bearing  status;Precautions;Other (comment) (keep call bell and tray on R side)  Activity Tolerance: Patient limited by pain Patient left: in bed;with call bell/phone within reach;with bed alarm set;Other (comment) (modified chair position)  OT Visit Diagnosis: Unsteadiness on feet (R26.81);Other abnormalities of gait and mobility (R26.89);Repeated falls (R29.6);Muscle weakness (generalized) (M62.81);Low vision, both eyes (H54.2);Other symptoms and signs involving cognitive function;Pain Pain - Right/Left: Left Pain - part of body: Hip                Time: 9140-9075 OT Time Calculation (min): 25 min Charges:  OT General Charges $OT Visit: 1 Visit OT Evaluation $OT Eval Moderate Complexity: 1 Mod  Lenell Mcconnell, OT/L   Acute OT Clinical Specialist Acute Rehabilitation Services Pager [phone] Office [phone]   [facility] 04/13/2024, 10:17 AM

## 2024-04-13 NOTE — Plan of Care (Signed)
  Problem: Education: Goal: Ability to describe self-care measures that may prevent or decrease complications (Diabetes Survival Skills Education) will improve Outcome: Progressing Goal: Individualized Educational Video(s) Outcome: Progressing   Problem: Coping: Goal: Ability to adjust to condition or change in health will improve Outcome: Progressing   Problem: Fluid Volume: Goal: Ability to maintain a balanced intake and output will improve Outcome: Progressing   Problem: Health Behavior/Discharge Planning: Goal: Ability to identify and utilize available resources and services will improve Outcome: Progressing Goal: Ability to manage health-related needs will improve Outcome: Progressing   Problem: Metabolic: Goal: Ability to maintain appropriate glucose levels will improve Outcome: Progressing   Problem: Nutritional: Goal: Maintenance of adequate nutrition will improve Outcome: Progressing Goal: Progress toward achieving an optimal weight will improve Outcome: Progressing   Problem: Skin Integrity: Goal: Risk for impaired skin integrity will decrease Outcome: Progressing   Problem: Tissue Perfusion: Goal: Adequacy of tissue perfusion will improve Outcome: Progressing   Problem: Education: Goal: Knowledge of General Education information will improve Description: Including pain rating scale, medication(s)/side effects and non-pharmacologic comfort measures Outcome: Progressing   Problem: Health Behavior/Discharge Planning: Goal: Ability to manage health-related needs will improve Outcome: Progressing   Problem: Clinical Measurements: Goal: Ability to maintain clinical measurements within normal limits will improve Outcome: Progressing Goal: Will remain free from infection Outcome: Progressing Goal: Diagnostic test results will improve Outcome: Progressing Goal: Respiratory complications will improve Outcome: Progressing Goal: Cardiovascular complication will  be avoided Outcome: Progressing   Problem: Activity: Goal: Risk for activity intolerance will decrease Outcome: Progressing   Problem: Nutrition: Goal: Adequate nutrition will be maintained Outcome: Progressing   Problem: Coping: Goal: Level of anxiety will decrease Outcome: Progressing   Problem: Elimination: Goal: Will not experience complications related to bowel motility Outcome: Progressing Goal: Will not experience complications related to urinary retention Outcome: Progressing   Problem: Pain Managment: Goal: General experience of comfort will improve and/or be controlled Outcome: Progressing   Problem: Safety: Goal: Ability to remain free from injury will improve Outcome: Progressing   Problem: Skin Integrity: Goal: Risk for impaired skin integrity will decrease Outcome: Progressing   Problem: Education: Goal: Verbalization of understanding the information provided (i.e., activity precautions, restrictions, etc) will improve Outcome: Progressing Goal: Individualized Educational Video(s) Outcome: Progressing   Problem: Activity: Goal: Ability to ambulate and perform ADLs will improve Outcome: Progressing   Problem: Clinical Measurements: Goal: Postoperative complications will be avoided or minimized Outcome: Progressing   Problem: Self-Concept: Goal: Ability to maintain and perform role responsibilities to the fullest extent possible will improve Outcome: Progressing   Problem: Pain Management: Goal: Pain level will decrease Outcome: Progressing

## 2024-04-13 NOTE — TOC Initial Note (Signed)
 Transition of Care [facility]) - Initial/Assessment Note    Patient Details  Name: Dennis Wise MRN: [id number] Date of Birth: January 10, 1951  [facility] CM/SW Contact:    Jeoffrey LITTIE Moose, [place] Phone Number: 04/13/2024, 10:19 AM  Clinical Narrative:                 Pt admitted from home with wife due to fall. Pt currently Ox[facility]- CSW contacted pt wife, Nena, and completed SNF workup. Nena stated pt was at [name] from late October-early November and is not sure she wants pt to go to SNF again. Nena stated she would like to see how he does today before making a decision on SNF or home. CSW will continue to follow.    Barriers to Discharge: Continued Medical Work up   Patient Goals and CMS Choice            Expected Discharge Plan and Services       Living arrangements for the past 2 months: Single Family Home                                      Prior Living Arrangements/Services Living arrangements for the past 2 months: Single Family Home Lives with:: Spouse Patient language and need for interpreter reviewed:: Yes Do you feel safe going back to the place where you live?: Yes      Need for Family Participation in Patient Care: Yes (Comment) Care giver support system in place?: Yes (comment)   Criminal Activity/Legal Involvement Pertinent to Current Situation/Hospitalization: No - Comment as needed  Activities of Daily Living   ADL Screening (condition at time of admission) Independently performs ADLs?: No Does the patient have a NEW difficulty with bathing/dressing/toileting/self-feeding that is expected to last >3 days?: Yes (Initiates electronic notice to provider for possible OT consult) Does the patient have a NEW difficulty with getting in/out of bed, walking, or climbing stairs that is expected to last >3 days?: Yes (Initiates electronic notice to provider for possible PT consult) Does the patient have a NEW difficulty with communication that  is expected to last >3 days?: No Is the patient deaf or have difficulty hearing?: Yes Does the patient have difficulty seeing, even when wearing glasses/contacts?: No Does the patient have difficulty concentrating, remembering, or making decisions?: Yes  Permission Sought/Granted Permission sought to share information with : Family Supports    Share Information with NAME: Nena     Permission granted to share info w Relationship: Spouse  Permission granted to share info w Contact Information: [phone]  Emotional Assessment Appearance:: Appears stated age Attitude/Demeanor/Rapport: Unable to Assess Affect (typically observed): Unable to Assess Orientation: : Oriented to Self Alcohol  / Substance Use: Not Applicable Psych Involvement: No (comment)  Admission diagnosis:  Periprosthetic fracture around internal prosthetic left hip joint, initial encounter (HCC) [F02.02XA] Periprosthetic hip fracture, initial encounter [[id number], Z96.649] Patient Active Problem List   Diagnosis Date Noted   Periprosthetic hip fracture, initial encounter 04/11/2024   Sacral pressure ulcer 03/27/2024   History of hip fracture 03/27/2024   Fall at home, initial encounter 02/15/2024   Elbow fracture, left, closed, initial encounter 02/15/2024   Chronic kidney disease, stage 3a (HCC) 02/15/2024   History of fusion of cervical spine 02/15/2024   Dyslipidemia 02/15/2024   History of stroke 02/15/2024   DNR (do not resuscitate) 02/15/2024   Closed left hip fracture (HCC) 02/07/2024  Post-operative pain 01/21/2024   Diabetic skin ulcer associated with type 2 diabetes mellitus (HCC) 10/06/2023   Other social stressor 05/31/2023   DM type 2 with diabetic background retinopathy (HCC) 05/31/2023   Mild vascular neurocognitive disorder 05/07/2023   Generalized anxiety disorder 10/01/2022   Pain in joint of left knee 09/27/2022   Hemorrhagic disorder due to circulating anticoagulants 09/27/2022    Ischemic right PCA stroke 09/05/2022   Left homonymous hemianopsia 08/2022   Shoulder pain 07/02/2022   Monoclonal B-cell lymphocytosis 01/15/2022   Weight loss 01/15/2022   Syncope 11/22/2021   Bilateral carotid artery stenosis 09/23/2021   Nocturia 08/18/2021   IDA (iron  deficiency anemia) 07/24/2021   Thrombocytopenia 05/03/2021   Normocytic anemia 04/03/2021   Unstable ankle 09/12/2020   Back pain 09/12/2020   Dupuytren contracture 03/03/2019   Pseudophakia, both eyes 07/17/2016   Combined form of senile cataract of both eyes 05/02/2016   Hearing loss 10/20/2014   Obesity (BMI 30-39.9) 10/16/2013   Advance care planning 10/16/2013   Actinic keratosis 10/16/2013   History of colonic polyps 06/05/2012   Pancytopenia, acquired 05/01/2011   Tibial fracture 10/24/2010   Diabetic retinopathy 03/21/2005   Essential hypertension 11/20/1999   GERD (gastroesophageal reflux disease) 01/19/1989   Pure hypercholesterolemia 04/21/1988   PCP:  [name], MD Pharmacy:   [facility] - [place], [place] - [address] [place] [address] Phone: [phone] Fax: [phone]  [facility] [address], [place] - [address] [place] [address] Phone: [phone] Fax: [phone]     Social Drivers of Health (SDOH) Social History: SDOH Screenings   Food Insecurity: No Food Insecurity (04/12/2024)  Housing: Low Risk (04/12/2024)  Transportation Needs: No Transportation Needs (04/12/2024)  Utilities: Not At Risk (04/12/2024)  Alcohol  Screen: Low Risk (05/05/2023)  Depression (PHQ2-9): Low Risk (03/24/2024)  Financial Resource Strain: Low Risk (02/03/2024)  Physical Activity: Inactive (02/03/2024)  Social Connections: Socially Integrated (04/12/2024)  Stress: No Stress Concern Present (02/03/2024)  Tobacco Use: Low Risk (04/12/2024)  Health Literacy: Adequate Health Literacy (05/05/2023)   SDOH  Interventions:     Readmission Risk Interventions     No data to display

## 2024-04-13 NOTE — Progress Notes (Addendum)
 Orthopaedic Progress Note  S: Patient is resting in bed.  Denies any significant plaints.  O:  Vitals:   04/13/24 0550 04/13/24 0715  BP: (!) 123/44 (!) 122/39  Pulse: 70 69  Resp: 17 18  Temp: 98 F (36.7 C) 98.6 F (37 C)  SpO2: 100% 100%    Incisional wound VAC is in place in the left thigh.  The left thigh is swollen but not tense.  No calf tenderness.  Intact sensation to saphenous, sural, tibial, peroneal nerve distributions.  3/5 strength EHL, FHL, gastrocs, GS and TA   Labs:  Results for orders placed or performed during the hospital encounter of 04/11/24 (from the past 24 hours)  Hemoglobin and hematocrit, blood     Status: Abnormal   Collection Time: 04/12/24  9:40 AM  Result Value Ref Range   Hemoglobin 7.2 (L) 13.0 - 17.0 g/dL   HCT 78.3 (L) 60.9 - 47.9 %  Prepare RBC (crossmatch)     Status: None   Collection Time: 04/12/24 11:53 AM  Result Value Ref Range   Order Confirmation      ORDER PROCESSED BY BLOOD BANK Performed at [facility], [address], [place], [place] [address]   Prepare platelet pheresis     Status: None (Preliminary result)   Collection Time: 04/12/24 11:54 AM  Result Value Ref Range   Unit Number [id number]    Blood Component Type PLTP3 PSORALEN TREATED    Unit division 00    Status of Unit ISSUED    Transfusion Status      OK TO TRANSFUSE Performed at [facility], [address], [place], [place] [address]   Glucose, capillary     Status: Abnormal   Collection Time: 04/12/24 11:59 AM  Result Value Ref Range   Glucose-Capillary 156 (H) 70 - 99 mg/dL  Glucose, capillary     Status: Abnormal   Collection Time: 04/12/24  2:03 PM  Result Value Ref Range   Glucose-Capillary 131 (H) 70 - 99 mg/dL   Comment 1 Notify RN    Comment 2 Document in Chart   POCT I-Stat EG7     Status: Abnormal   Collection Time: 04/12/24  2:46 PM  Result Value Ref Range   pH, Ven 7.337 7.25 - 7.43   pCO2, Ven 47.8 44 - 60 mmHg   pO2, Ven  54 (H) 32 - 45 mmHg   Bicarbonate 25.7 20.0 - 28.0 mmol/L   TCO2 27 22 - 32 mmol/L   O2 Saturation 86 %   Acid-Base Excess 0.0 0.0 - 2.0 mmol/L   Sodium 138 135 - 145 mmol/L   Potassium 4.5 3.5 - 5.1 mmol/L   Calcium , Ion 1.24 1.15 - 1.40 mmol/L   HCT 23.0 (L) 39.0 - 52.0 %   Hemoglobin 7.8 (L) 13.0 - 17.0 g/dL   Patient temperature 63.4 C    Sample type VENOUS   Prepare RBC (crossmatch) INTRAOP ONLY     Status: None   Collection Time: 04/12/24  3:14 PM  Result Value Ref Range   Order Confirmation      ORDER PROCESSED BY BLOOD BANK Performed at [facility], [address], [place], [place] [address]   POCT I-Stat EG7     Status: Abnormal   Collection Time: 04/12/24  4:26 PM  Result Value Ref Range   pH, Ven 7.315 7.25 - 7.43   pCO2, Ven 46.0 44 - 60 mmHg   pO2, Ven  39 32 - 45 mmHg   Bicarbonate 23.5 20.0 - 28.0 mmol/L   TCO2 25 22 - 32 mmol/L   O2 Saturation 69 %   Acid-base deficit 3.0 (H) 0.0 - 2.0 mmol/L   Sodium 138 135 - 145 mmol/L   Potassium 4.3 3.5 - 5.1 mmol/L   Calcium , Ion 1.16 1.15 - 1.40 mmol/L   HCT 21.0 (L) 39.0 - 52.0 %   Hemoglobin 7.1 (L) 13.0 - 17.0 g/dL   Patient temperature 63.0 C    Sample type VENOUS   Prepare platelet pheresis     Status: None (Preliminary result)   Collection Time: 04/12/24  4:35 PM  Result Value Ref Range   Unit Number [id number]    Blood Component Type PLTP3 PSORALEN TREATED    Unit division 00    Status of Unit ISSUED    Transfusion Status      OK TO TRANSFUSE Performed at [facility], [address] [place], [place] [address]   Prepare fresh frozen plasma     Status: None (Preliminary result)   Collection Time: 04/12/24  4:36 PM  Result Value Ref Range   Unit Number [id number]    Blood Component Type THW PLS APHR    Unit division B0    Status of Unit ISSUED    Transfusion Status      OK TO TRANSFUSE Performed at [facility], [address], [place], [place] [address]   Glucose,  capillary     Status: Abnormal   Collection Time: 04/12/24  7:20 PM  Result Value Ref Range   Glucose-Capillary 239 (H) 70 - 99 mg/dL  CBC     Status: Abnormal   Collection Time: 04/12/24  7:33 PM  Result Value Ref Range   WBC 7.9 4.0 - 10.5 K/uL   RBC 2.51 (L) 4.22 - 5.81 MIL/uL   Hemoglobin 7.8 (L) 13.0 - 17.0 g/dL   HCT 77.5 (L) 60.9 - 47.9 %   MCV 89.2 80.0 - 100.0 fL   MCH 31.1 26.0 - 34.0 pg   MCHC 34.8 30.0 - 36.0 g/dL   RDW 83.6 (H) 88.4 - 84.4 %   Platelets 100 (L) 150 - 400 K/uL   nRBC 0.0 0.0 - 0.2 %  Glucose, capillary     Status: Abnormal   Collection Time: 04/12/24  9:35 PM  Result Value Ref Range   Glucose-Capillary 215 (H) 70 - 99 mg/dL  Prepare RBC (crossmatch)     Status: None   Collection Time: 04/12/24 10:59 PM  Result Value Ref Range   Order Confirmation      ORDER PROCESSED BY BLOOD BANK Performed at [facility], [address] [place], [place] [address]   CBC     Status: Abnormal   Collection Time: 04/12/24 11:09 PM  Result Value Ref Range   WBC 6.9 4.0 - 10.5 K/uL   RBC 2.19 (L) 4.22 - 5.81 MIL/uL   Hemoglobin 6.7 (LL) 13.0 - 17.0 g/dL   HCT 80.6 (L) 60.9 - 47.9 %   MCV 88.1 80.0 - 100.0 fL   MCH 30.6 26.0 - 34.0 pg   MCHC 34.7 30.0 - 36.0 g/dL   RDW 83.2 (H) 88.4 - 84.4 %   Platelets 85 (L) 150 - 400 K/uL   nRBC 0.0 0.0 - 0.2 %  Basic metabolic panel     Status: Abnormal   Collection Time: 04/12/24 11:09 PM  Result Value Ref Range  Sodium 138 135 - 145 mmol/L   Potassium 5.2 (H) 3.5 - 5.1 mmol/L   Chloride 105 98 - 111 mmol/L   CO2 23 22 - 32 mmol/L   Glucose, Bld 250 (H) 70 - 99 mg/dL   BUN 38 (H) 8 - 23 mg/dL   Creatinine, Ser 8.41 (H) 0.61 - 1.24 mg/dL   Calcium  8.0 (L) 8.9 - 10.3 mg/dL   GFR, Estimated 45 (L) >60 mL/min   Anion gap 10 5 - 15  Glucose, capillary     Status: Abnormal   Collection Time: 04/13/24  8:07 AM  Result Value Ref Range   Glucose-Capillary 258 (H) 70 - 99 mg/dL    Assessment: Postop day 1 status  post repair of periprosthetic left femur fracture  The patient is doing well resting in bed.  His pain is controlled.  His H&H did drop significantly overnight and he did require significant transfusions during surgery.  Additional unit of blood was given last night, we are still waiting on a repeat H/H.  Will hold the Plavix  for 72 hours.  And then restart.  We can start Lovenox  postoperatively as well once his H&H is more stable up.  We will keep the wound VAC in place until discharge and then transfer him over to a Mepilex dressing.  In the meantime we will start to mobilize him with physical therapy.  He is nonweightbearing on his left side.  Injuries: Periprosthetic left hip fracture status post ORIF  Weightbearing: Nonweightbearing left leg  Insicional and dressing care: Incisional wound VAC for now, switch to Mepilex upon discharge    CV/Blood loss: H/H was 6.7/19.3.  The patient received 1 unit overnight.  Pain management: Continue current pain regimen  VTE prophylaxis: Hold Plavix  for now, can restart in 72 hours from surgery.    Dispo: Depending on progress with therapy  Follow - up plan: 2 weeks   Cordella Rhein, MD, MS [facility] / [place] [phone]

## 2024-04-14 DIAGNOSIS — M978XXA Periprosthetic fracture around other internal prosthetic joint, initial encounter: Secondary | ICD-10-CM | POA: Diagnosis not present

## 2024-04-14 DIAGNOSIS — Z96649 Presence of unspecified artificial hip joint: Secondary | ICD-10-CM | POA: Diagnosis not present

## 2024-04-14 LAB — TYPE AND SCREEN
ABO/RH(D): B POS
Antibody Screen: NEGATIVE
Unit division: 0
Unit division: 0
Unit division: 0
Unit division: 0
Unit division: 0
Unit division: 0
Unit division: 0
Unit division: 0
Unit division: 0
Unit division: 0

## 2024-04-14 LAB — BPAM RBC
Blood Product Expiration Date: 202601102359
Blood Product Expiration Date: 202601102359
Blood Product Expiration Date: 202601112359
Blood Product Expiration Date: 202601112359
Blood Product Expiration Date: 202601112359
Blood Product Expiration Date: 202601132359
Blood Product Expiration Date: 202601142359
Blood Product Expiration Date: 202601182359
Blood Product Expiration Date: 202601182359
Blood Product Expiration Date: 202601182359
ISSUE DATE / TIME: 202512230447
ISSUE DATE / TIME: 202512231203
ISSUE DATE / TIME: 202512231301
ISSUE DATE / TIME: 202512231301
ISSUE DATE / TIME: 202512231648
ISSUE DATE / TIME: 202512231648
ISSUE DATE / TIME: 202512232331
ISSUE DATE / TIME: 202512240333
ISSUE DATE / TIME: 202512241255
ISSUE DATE / TIME: 202512241641
Unit Type and Rh: 7300
Unit Type and Rh: 7300
Unit Type and Rh: 7300
Unit Type and Rh: 7300
Unit Type and Rh: 7300
Unit Type and Rh: 7300
Unit Type and Rh: 7300
Unit Type and Rh: 7300
Unit Type and Rh: 7300
Unit Type and Rh: 7300

## 2024-04-14 LAB — CBC WITH DIFFERENTIAL/PLATELET
Abs Immature Granulocytes: 0.13 K/uL — ABNORMAL HIGH (ref 0.00–0.07)
Basophils Absolute: 0 K/uL (ref 0.0–0.1)
Basophils Relative: 0 %
Eosinophils Absolute: 0.1 K/uL (ref 0.0–0.5)
Eosinophils Relative: 2 %
HCT: 22.2 % — ABNORMAL LOW (ref 39.0–52.0)
Hemoglobin: 7.9 g/dL — ABNORMAL LOW (ref 13.0–17.0)
Immature Granulocytes: 2 %
Lymphocytes Relative: 10 %
Lymphs Abs: 0.8 K/uL (ref 0.7–4.0)
MCH: 30.9 pg (ref 26.0–34.0)
MCHC: 35.6 g/dL (ref 30.0–36.0)
MCV: 86.7 fL (ref 80.0–100.0)
Monocytes Absolute: 0.9 K/uL (ref 0.1–1.0)
Monocytes Relative: 12 %
Neutro Abs: 5.6 K/uL (ref 1.7–7.7)
Neutrophils Relative %: 74 %
Platelets: 65 K/uL — ABNORMAL LOW (ref 150–400)
RBC: 2.56 MIL/uL — ABNORMAL LOW (ref 4.22–5.81)
RDW: 16.4 % — ABNORMAL HIGH (ref 11.5–15.5)
WBC: 7.5 K/uL (ref 4.0–10.5)
nRBC: 0 % (ref 0.0–0.2)

## 2024-04-14 LAB — PROTIME-INR
INR: 1.1 (ref 0.8–1.2)
Prothrombin Time: 14.5 s (ref 11.4–15.2)

## 2024-04-14 LAB — VITAMIN D 25 HYDROXY (VIT D DEFICIENCY, FRACTURES): Vit D, 25-Hydroxy: 17.2 ng/mL — ABNORMAL LOW (ref 30–100)

## 2024-04-14 LAB — GLUCOSE, CAPILLARY
Glucose-Capillary: 150 mg/dL — ABNORMAL HIGH (ref 70–99)
Glucose-Capillary: 130 mg/dL — ABNORMAL HIGH (ref 70–99)
Glucose-Capillary: 151 mg/dL — ABNORMAL HIGH (ref 70–99)
Glucose-Capillary: 160 mg/dL — ABNORMAL HIGH (ref 70–99)

## 2024-04-14 LAB — BASIC METABOLIC PANEL WITH GFR
Anion gap: 9 (ref 5–15)
BUN: 49 mg/dL — ABNORMAL HIGH (ref 8–23)
CO2: 25 mmol/L (ref 22–32)
Calcium: 7.9 mg/dL — ABNORMAL LOW (ref 8.9–10.3)
Chloride: 102 mmol/L (ref 98–111)
Creatinine, Ser: 1.93 mg/dL — ABNORMAL HIGH (ref 0.61–1.24)
GFR, Estimated: 36 mL/min — ABNORMAL LOW
Glucose, Bld: 157 mg/dL — ABNORMAL HIGH (ref 70–99)
Potassium: 4.3 mmol/L (ref 3.5–5.1)
Sodium: 136 mmol/L (ref 135–145)

## 2024-04-14 MED ORDER — POLYETHYLENE GLYCOL 3350 17 G PO PACK
17.0000 g | PACK | Freq: Two times a day (BID) | ORAL | Status: DC
  Administered 2024-04-14 – 2024-04-23 (×10): 17 g via ORAL
  Filled 2024-04-14 (×8): qty 1

## 2024-04-14 NOTE — Plan of Care (Signed)
" °  Problem: Health Behavior/Discharge Planning: Goal: Ability to manage health-related needs will improve Outcome: Progressing   Problem: Skin Integrity: Goal: Risk for impaired skin integrity will decrease Outcome: Progressing   Problem: Clinical Measurements: Goal: Will remain free from infection Outcome: Progressing Goal: Diagnostic test results will improve Outcome: Progressing Goal: Respiratory complications will improve Outcome: Progressing   Problem: Activity: Goal: Risk for activity intolerance will decrease Outcome: Progressing   Problem: Coping: Goal: Level of anxiety will decrease Outcome: Progressing   Problem: Pain Managment: Goal: General experience of comfort will improve and/or be controlled Outcome: Progressing   Problem: Skin Integrity: Goal: Risk for impaired skin integrity will decrease Outcome: Progressing   "

## 2024-04-14 NOTE — Progress Notes (Signed)
 " PROGRESS NOTE Dennis Wise    DOB: [birth year], 73 y.o.  FMW:[id number]    Code Status: Full Code   DOA: 04/11/2024   LOS: 3  Brief hospital course  Dennis Wise is a 73 y.o. male with a PMH significant for CVA on aspirin /Plavix , anemia of chronic disease and iron  deficiency, chronic thrombocytopenia, CKD stage IIIa, HTN, HLD, T2DM, GAD, s/p left elbow ORIF 02/08/2024 and left THA 02/10/2024 who is admitted for left periprosthetic hip fracture. 12/23: Seen by Dr. 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 Transfusion threshold <7. 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 Extensive evaluation previously has not identified an alternative cause. Multiple prior red blood cell transfusions increase risk of transfusion reactions and alloimmunization. ABLA: possible hematoma.  - monitor H&H - RD consult - hematology following  Thrombocytopenia secondary to splenomegaly - Set platelet transfusion threshold at <50,000/?L unless active bleeding. - Please avoid unnecessary platelet transfusions to minimize  alloimmunization and transfusion reaction risk. - Monitor platelet counts and assess for bleeding. - CBC am   CKD stage IIIa Mild hyperkalemia: Renal function relatively stable.  Continue to monitor.    History of CVA: Holding Plavix  in the setting of significant anemia-once okay with orthopedics and hemoglobin stable.   Type 2 diabetes: Poorly controlled hyperglycemia.PTA on glipizide metformin  Actos , holding p.o. meds.On SSI 0 to 15 units, add Semglee  10 units and novolog  2 u premeal   Hypertension: BP well-controlled.  Continue losartan  and HCTZ.   Hyperlipidemia: Continue atorvastatin .   Mild vascular neurocognitive disorder GAD: Stable, continue Lexapro  and Namenda .keep on  delirium precautions.   Class I Obesity w/ Body mass index is 31.19 kg/m.: Will benefit with PCP follow-up, weight loss,healthy lifestyle and outpatient eval  Body mass index is 31.19 kg/m.  VTE ppx: SCDs Start: 04/12/24 2251 SCDs Start: 04/11/24 2344  Diet:     Diet   Diet Carb Modified Room service appropriate? Yes   Consultants: Ortho surgery  Heme/onc  Subjective 04/14/2024    Seen and examined today. Complains of left elbow and leg pain which is moderately controlled when not moving.    Objective  Blood pressure (!) 159/53, pulse 70, temperature 98.6 F (37 C), temperature source Oral, resp. rate 11, height 6' 2 (1.88 m), weight 110.2 kg, SpO2 99%.  Intake/Output Summary (Last 24 hours) at 04/14/2024 0804 Last data filed at 04/14/2024 0534 Gross per 24 hour  Intake 858 ml  Output 225 ml  Net 633 ml   Filed Weights   04/11/24 2034 04/12/24 1143  Weight: 122.5 kg 110.2 kg    Physical Exam:  General:  awake, alert, NAD HEENT: atraumatic, clear conjunctiva, anicteric sclera, MMM, hearing grossly normal Respiratory: normal respiratory effort. Cardiovascular: extremities well perfused, quick capillary refill, normal S1/S2, RRR, no JVD, murmurs Nervous: A&O x3. no gross focal  neurologic deficits, normal speech Extremities: swelling and ecchymosis of left arm and leg. Tender to palpation  Psychiatry: normal mood, congruent affect  Labs   I have personally reviewed the following labs and imaging studies CBC    Component Value Date/Time   WBC 7.5 04/14/2024 0223   RBC 2.56 (L) 04/14/2024 0223   HGB 7.9 (L) 04/14/2024 0223   HGB 11.0 (L) 02/04/2024 0911   HGB 12.2 (L) 01/28/2016 0803   HCT 22.2 (L) 04/14/2024 0223   HCT 35.9 (L) 01/28/2016 0803   PLT 65 (L) 04/14/2024 0223   PLT 95 (L) 02/04/2024 0911   PLT 106 (L) 01/28/2016 0803   MCV 86.7 04/14/2024 0223   MCV 86.7 01/28/2016 0803   MCH 30.9 04/14/2024 0223   MCHC 35.6 04/14/2024 0223   RDW 16.4 (H) 04/14/2024 0223   RDW 13.9 01/28/2016 0803   LYMPHSABS 0.8 04/14/2024 0223   LYMPHSABS 1.1 01/28/2016 0803   MONOABS 0.9 04/14/2024 0223   MONOABS 0.4 01/28/2016 0803   EOSABS 0.1 04/14/2024 0223   EOSABS 0.1 01/28/2016 0803   BASOSABS 0.0 04/14/2024 0223   BASOSABS 0.0 01/28/2016 0803      Latest Ref Rng & Units 04/14/2024    2:23 AM 04/12/2024   11:09 PM 04/12/2024    4:26 PM  BMP  Glucose 70 - 99 mg/dL 842  749    BUN 8 - 23 mg/dL 49  38    Creatinine 9.38 - 1.24 mg/dL 8.06  8.41    Sodium 864 - 145 mmol/L 136  138  138   Potassium 3.5 - 5.1 mmol/L 4.3  5.2  4.3   Chloride 98 - 111 mmol/L 102  105    CO2 22 - 32 mmol/L 25  23    Calcium  8.9 - 10.3 mg/dL 7.9  8.0      DG FEMUR PORT MIN 2 VIEWS LEFT Result Date: 04/12/2024 CLINICAL DATA:  Fracture, postop. EXAM: LEFT FEMUR PORTABLE 2 VIEWS COMPARISON:  Preoperative imaging FINDINGS: Lateral plate and screw fixation of periprosthetic femur fracture. Decreased fracture displacement from preoperative imaging. Previous hip arthroplasty with cerclage wire fixation. Previous hardware within the patella and proximal tibia. Recent postsurgical change includes air and edema in the soft tissues. IMPRESSION: ORIF of periprosthetic femur fracture.  Decreased fracture displacement from preoperative imaging. Electronically Signed   By: Andrea Gasman M.D.   On: 04/12/2024 20:36   DG FEMUR MIN 2 VIEWS LEFT Result Date: 04/12/2024 CLINICAL DATA:  Elective surgery. EXAM: LEFT FEMUR 2 VIEWS COMPARISON:  Preoperative imaging FINDINGS: Twelve fluoroscopic spot views of the left femur submitted from the operating room. Plate and screw fixation of periprosthetic fracture adjacent to femoral stem of hip arthroplasty. Previous surgical fixation of the patella and proximal tibia. Fluoroscopy time 51.5 seconds. Dose 9.84 mGy. IMPRESSION: Intraoperative fluoroscopy during ORIF of femur fracture. Electronically Signed   By: Andrea Gasman M.D.   On: 04/12/2024 20:35   DG C-Arm 1-60 Min-No Report Result Date: 04/12/2024 Fluoroscopy was utilized by the requesting physician.  No radiographic interpretation.   DG C-Arm 1-60 Min-No Report Result Date: 04/12/2024 Fluoroscopy was utilized by the requesting physician.  No radiographic interpretation.   DG C-Arm 1-60 Min-No Report Result Date: 04/12/2024 Fluoroscopy was utilized by the requesting physician.  No radiographic interpretation.   DG C-Arm 1-60 Min-No Report Result Date: 04/12/2024 Fluoroscopy was utilized by the requesting physician.  No radiographic interpretation.    Disposition Plan & Communication  Patient status: Inpatient  Admitted From: Home Planned disposition location: Skilled nursing facility Anticipated discharge date: TBD pending clinical stability   Family Communication: none at bedside    Author: Marien LITTIE Piety, DO [facility] 04/14/2024, 8:04 AM   Available by Epic secure chat 7AM-7PM. If 7PM-7AM, please contact night-coverage.  [facility] contact information found on [email or web address].  "

## 2024-04-14 NOTE — Progress Notes (Signed)
 Orthopaedic Progress Note  S: Patient is resting comfortably in bed.  He says incisional VAC in his left hip.  He reports his pain is tolerable.  They tried to sit him up yesterday with therapy but he was not able to get past yesterday   O:  Vitals:   04/14/24 1032 04/14/24 1117  BP: (!) 137/48   Pulse: 64   Resp: 19   Temp: 98.8 F (37.1 C) 98.6 F (37 C)  SpO2: 100%     Well-appearing gentleman no acute distress.  Incisional VAC is in place on the left hip.  The left thigh is swollen but soft and compressible.  No calf tenderness.  Intact station in the saphenous, sural, tibial and peroneal nerve distributions.  5/5 strength EHL, FHL, gastroc  Labs:  Results for orders placed or performed during the hospital encounter of 04/11/24 (from the past 24 hours)  Glucose, capillary     Status: Abnormal   Collection Time: 04/13/24  2:30 PM  Result Value Ref Range   Glucose-Capillary 197 (H) 70 - 99 mg/dL  Glucose, capillary     Status: Abnormal   Collection Time: 04/13/24  5:55 PM  Result Value Ref Range   Glucose-Capillary 217 (H) 70 - 99 mg/dL  Glucose, capillary     Status: Abnormal   Collection Time: 04/13/24  9:00 PM  Result Value Ref Range   Glucose-Capillary 159 (H) 70 - 99 mg/dL  Hemoglobin and hematocrit, blood     Status: Abnormal   Collection Time: 04/13/24 10:05 PM  Result Value Ref Range   Hemoglobin 8.3 (L) 13.0 - 17.0 g/dL   HCT 76.8 (L) 60.9 - 47.9 %  Basic metabolic panel     Status: Abnormal   Collection Time: 04/14/24  2:23 AM  Result Value Ref Range   Sodium 136 135 - 145 mmol/L   Potassium 4.3 3.5 - 5.1 mmol/L   Chloride 102 98 - 111 mmol/L   CO2 25 22 - 32 mmol/L   Glucose, Bld 157 (H) 70 - 99 mg/dL   BUN 49 (H) 8 - 23 mg/dL   Creatinine, Ser 8.06 (H) 0.61 - 1.24 mg/dL   Calcium  7.9 (L) 8.9 - 10.3 mg/dL   GFR, Estimated 36 (L) >60 mL/min   Anion gap 9 5 - 15  VITAMIN D  25 Hydroxy (Vit-D Deficiency, Fractures)     Status: Abnormal   Collection Time:  04/14/24  2:23 AM  Result Value Ref Range   Vit D, 25-Hydroxy 17.2 (L) 30 - 100 ng/mL  Protime-INR     Status: None   Collection Time: 04/14/24  2:23 AM  Result Value Ref Range   Prothrombin Time 14.5 11.4 - 15.2 seconds   INR 1.1 0.8 - 1.2  CBC with Differential/Platelet     Status: Abnormal   Collection Time: 04/14/24  2:23 AM  Result Value Ref Range   WBC 7.5 4.0 - 10.5 K/uL   RBC 2.56 (L) 4.22 - 5.81 MIL/uL   Hemoglobin 7.9 (L) 13.0 - 17.0 g/dL   HCT 77.7 (L) 60.9 - 47.9 %   MCV 86.7 80.0 - 100.0 fL   MCH 30.9 26.0 - 34.0 pg   MCHC 35.6 30.0 - 36.0 g/dL   RDW 83.5 (H) 88.4 - 84.4 %   Platelets 65 (L) 150 - 400 K/uL   nRBC 0.0 0.0 - 0.2 %   Neutrophils Relative % 74 %   Neutro Abs 5.6 1.7 - 7.7 K/uL   Lymphocytes  Relative 10 %   Lymphs Abs 0.8 0.7 - 4.0 K/uL   Monocytes Relative 12 %   Monocytes Absolute 0.9 0.1 - 1.0 K/uL   Eosinophils Relative 2 %   Eosinophils Absolute 0.1 0.0 - 0.5 K/uL   Basophils Relative 0 %   Basophils Absolute 0.0 0.0 - 0.1 K/uL   Immature Granulocytes 2 %   Abs Immature Granulocytes 0.13 (H) 0.00 - 0.07 K/uL  Glucose, capillary     Status: Abnormal   Collection Time: 04/14/24  8:08 AM  Result Value Ref Range   Glucose-Capillary 150 (H) 70 - 99 mg/dL  Glucose, capillary     Status: Abnormal   Collection Time: 04/14/24 11:26 AM  Result Value Ref Range   Glucose-Capillary 130 (H) 70 - 99 mg/dL    Assessment: Postop day 2 status post open reduction and internal fixation of left periprosthetic hip fracture   The patient is clinically improving.  Yesterday his H&H was significantly low.  He did receive additional dose of blood as well as platelets and FFP.  His hemoglobin seems to be improved.  Still low but more stable.  His platelets are still low as well.  His INR is within normal limits.  At this point we will continue to monitor his blood levels.  His INR is elevated at 1.93, will defer to medicine for further management of this.  He can be  continue to mobilize with physical therapy.  Again he is nonweightbearing on the left leg.  He is due to restart his Plavix  tomorrow.  We discussed starting Lovenox  as well.  Given his recent blood loss as well as his low platelets, I would hold off on this and just continue with Plavix  starting tomorrow.  In the meantime we will continue his current pain regimen.  Continue following closely, hospital.  Injuries: Left periprosthetic femur fracture  Weightbearing: Nonweightbearing  Insicional and dressing care: Incisional wound VAC while here in the hospital and then transition to dressings on discharge    CV/Blood loss: H&H improved but will continue to monitor  Pain management: Continue current pain regimen  VTE prophylaxis: Start Plavix  tomorrow    Dispo: Likely rehab placement depending on how progresses with therapy     Cordella Rhein, MD, MS [facility] / [place] [phone]

## 2024-04-15 ENCOUNTER — Other Ambulatory Visit (HOSPITAL_COMMUNITY): Payer: Self-pay

## 2024-04-15 DIAGNOSIS — M978XXA Periprosthetic fracture around other internal prosthetic joint, initial encounter: Secondary | ICD-10-CM | POA: Diagnosis not present

## 2024-04-15 DIAGNOSIS — Z96649 Presence of unspecified artificial hip joint: Secondary | ICD-10-CM | POA: Diagnosis not present

## 2024-04-15 LAB — BASIC METABOLIC PANEL WITH GFR
Anion gap: 9 (ref 5–15)
BUN: 52 mg/dL — ABNORMAL HIGH (ref 8–23)
CO2: 24 mmol/L (ref 22–32)
Calcium: 8.1 mg/dL — ABNORMAL LOW (ref 8.9–10.3)
Chloride: 102 mmol/L (ref 98–111)
Creatinine, Ser: 1.72 mg/dL — ABNORMAL HIGH (ref 0.61–1.24)
GFR, Estimated: 41 mL/min — ABNORMAL LOW
Glucose, Bld: 158 mg/dL — ABNORMAL HIGH (ref 70–99)
Potassium: 4.5 mmol/L (ref 3.5–5.1)
Sodium: 135 mmol/L (ref 135–145)

## 2024-04-15 LAB — CBC
HCT: 21.7 % — ABNORMAL LOW (ref 39.0–52.0)
Hemoglobin: 7.5 g/dL — ABNORMAL LOW (ref 13.0–17.0)
MCH: 30.4 pg (ref 26.0–34.0)
MCHC: 34.6 g/dL (ref 30.0–36.0)
MCV: 87.9 fL (ref 80.0–100.0)
Platelets: 68 K/uL — ABNORMAL LOW (ref 150–400)
RBC: 2.47 MIL/uL — ABNORMAL LOW (ref 4.22–5.81)
RDW: 16.4 % — ABNORMAL HIGH (ref 11.5–15.5)
WBC: 7.8 K/uL (ref 4.0–10.5)
nRBC: 0 % (ref 0.0–0.2)

## 2024-04-15 LAB — BPAM PLATELET PHERESIS
Blood Product Expiration Date: 202512252359
ISSUE DATE / TIME: 202512251038
Unit Type and Rh: 202512252359
Unit Type and Rh: 6200

## 2024-04-15 LAB — PREPARE PLATELET PHERESIS: Unit division: 0

## 2024-04-15 LAB — GLUCOSE, CAPILLARY
Glucose-Capillary: 160 mg/dL — ABNORMAL HIGH (ref 70–99)
Glucose-Capillary: 168 mg/dL — ABNORMAL HIGH (ref 70–99)
Glucose-Capillary: 191 mg/dL — ABNORMAL HIGH (ref 70–99)

## 2024-04-15 MED ORDER — INSULIN GLARGINE 100 UNIT/ML ~~LOC~~ SOLN
10.0000 [IU] | Freq: Every day | SUBCUTANEOUS | Status: DC
  Administered 2024-04-15 – 2024-04-23 (×9): 10 [IU] via SUBCUTANEOUS
  Filled 2024-04-15 (×4): qty 0.1

## 2024-04-15 MED ORDER — CLOPIDOGREL BISULFATE 75 MG PO TABS
75.0000 mg | ORAL_TABLET | Freq: Every day | ORAL | Status: DC
  Administered 2024-04-15: 75 mg via ORAL
  Filled 2024-04-15 (×2): qty 1

## 2024-04-15 NOTE — Progress Notes (Signed)
 " PROGRESS NOTE [facility] [place]    DOB: 23-Oct-1950, 73 y.o.  FMW:[id number]    Code Status: Full Code   DOA: 04/11/2024   LOS: 4  Brief hospital course  MORTON SIMSON is a 73 y.o. male with a PMH significant for CVA on aspirin /Plavix , anemia of chronic disease and iron  deficiency, chronic thrombocytopenia, CKD stage IIIa, HTN, HLD, T2DM, GAD, s/p left elbow ORIF 02/08/2024 and left THA 02/10/2024 who is admitted for left periprosthetic hip fracture. Ortho brought back to OR for fixation 12/23 and is recovering as expected with wound vac in place. Ortho surgery has been following for this PT/OT are working with him and recommending SNF.  This admission is complicated by his anemia and thrombocytopenia with splenomegaly.  Hematology has been following for this. Labs are relatively stable above transfusion threshold at this time without signs of acute bleeding.  He will be started on plavix  again today for ppx and if his CBC remains stable, can consider stable for SNF dc.   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 Will start plavix  today per ortho surgery recs and continue to monitor for bleeding. Minimal output in wound vac at this time.  - PT/OT- recommending SNF at dc  Left olecranon fracture- s/p fixation - OT, PT - keep elevated - analgesia PRN   Acute on chronic anemia- hgb 7.9>7.5 today. Transfusion threshold <7. S/p 3 platelete, 1 ffp, and 10u pRBC this admission so far. presumed secondary to splenic  sequestration. Extensive evaluation previously has not identified an alternative cause. Minimize transfusions as able to increase risk of transfusion reactions and alloimmunization. ABLA: possible hematoma.  - monitor H&H - RD consult - hematology following  Thrombocytopenia secondary to splenomegaly - Set platelet transfusion threshold at <50,000/?L unless active bleeding. - Please avoid unnecessary platelet transfusions to minimize alloimmunization and transfusion reaction risk. - Monitor platelet counts and assess for bleeding. - CBC am   CKD stage IIIa Mild hyperkalemia: resolved Renal function relatively stable.  Continue to monitor.    History of CVA: Restarting plavix  today.    Type 2 diabetes: Poorly controlled hyperglycemia.PTA on glipizide metformin  Actos , holding p.o. meds.On SSI 0 to 15 units, add Semglee  10 units and novolog  2 u premeal   Hypertension: BP well-controlled.  Continue losartan  and HCTZ.   Hyperlipidemia: Continue atorvastatin .   Mild vascular neurocognitive disorder GAD: Stable, continue Lexapro  and Namenda .keep on  delirium precautions.   Class I Obesity w/ Body mass index is 31.19 kg/m.: Will benefit with PCP follow-up, weight loss,healthy lifestyle and outpatient eval  Body mass index is 31.19 kg/m.  VTE ppx: SCDs Start: 04/12/24 2251  Diet:     Diet   Diet Carb Modified Room service appropriate? Yes   Consultants: Ortho surgery  Heme/onc  Subjective 04/15/2024    Seen and examined today. Complains that he was told by staff to poop and pee in the bed. He has not been out of bed since his surgery. Requests to use the bedside commode. Expresses understanding  that he cannot bear weight on his left leg    Objective  Blood pressure (!) 159/53, pulse 70, temperature 98.6 F (37 C), temperature source Oral, resp. rate 11, height 6' 2 (1.88 m), weight 110.2 kg, SpO2 99%.  Intake/Output Summary (Last 24 hours) at 04/15/2024 0747 Last  data filed at 04/15/2024 0500 Gross per 24 hour  Intake 831.67 ml  Output 330 ml  Net 501.67 ml   Filed Weights   04/11/24 2034 04/12/24 1143  Weight: 122.5 kg 110.2 kg    Physical Exam:  General: awake, alert, NAD HEENT: atraumatic, clear conjunctiva, anicteric sclera, MMM, hearing grossly normal Respiratory: normal respiratory effort. Cardiovascular: extremities well perfused, quick capillary refill, normal S1/S2, RRR, no JVD, murmurs Nervous: A&O x3. no gross focal neurologic deficits, normal speech Extremities: swelling and ecchymosis of left arm and leg. Tender to palpation. Wound vac in place on left thigh Psychiatry: normal mood, congruent affect  Labs   I have personally reviewed the following labs and imaging studies CBC    Component Value Date/Time   WBC 7.8 04/15/2024 0540   RBC 2.47 (L) 04/15/2024 0540   HGB 7.5 (L) 04/15/2024 0540   HGB 11.0 (L) 02/04/2024 0911   HGB 12.2 (L) 01/28/2016 0803   HCT 21.7 (L) 04/15/2024 0540   HCT 35.9 (L) 01/28/2016 0803   PLT 68 (L) 04/15/2024 0540   PLT 95 (L) 02/04/2024 0911   PLT 106 (L) 01/28/2016 0803   MCV 87.9 04/15/2024 0540   MCV 86.7 01/28/2016 0803   MCH 30.4 04/15/2024 0540   MCHC 34.6 04/15/2024 0540   RDW 16.4 (H) 04/15/2024 0540   RDW 13.9 01/28/2016 0803   LYMPHSABS 0.8 04/14/2024 0223   LYMPHSABS 1.1 01/28/2016 0803   MONOABS 0.9 04/14/2024 0223   MONOABS 0.4 01/28/2016 0803   EOSABS 0.1 04/14/2024 0223   EOSABS 0.1 01/28/2016 0803   BASOSABS 0.0 04/14/2024 0223   BASOSABS 0.0 01/28/2016 0803      Latest Ref Rng & Units 04/15/2024    5:40 AM 04/14/2024    2:23 AM 04/12/2024   11:09 PM  BMP  Glucose 70 - 99 mg/dL 841  842  749   BUN 8 - 23 mg/dL 52  49  38   Creatinine 0.61 - 1.24 mg/dL 8.27  8.06  8.41   Sodium 135 - 145 mmol/L 135  136  138   Potassium 3.5 - 5.1 mmol/L 4.5  4.3  5.2   Chloride 98 - 111 mmol/L 102  102  105   CO2 22 - 32 mmol/L 24  25  23    Calcium  8.9 - 10.3 mg/dL 8.1  7.9   8.0     No results found.   Disposition Plan & Communication  Patient status: Inpatient  Admitted From: Home Planned disposition location: Skilled nursing facility Anticipated discharge date: TBD pending clinical stability   Family Communication: none at bedside    Author: Marien LITTIE Piety, DO [facility] 04/15/2024, 7:47 AM   Available by Epic secure chat 7AM-7PM. If 7PM-7AM, please contact night-coverage.  [facility] contact information found on [email or web address].  "

## 2024-04-15 NOTE — Progress Notes (Signed)
 "    Subjective:  Patient reports pain as moderate.  Patient is resting in bed.  He is slightly confused.  He reports he really has not done much with therapy.  He is asking to be help to the bathroom.  Yesterday's total administered Morphine  Milligram Equivalents: 50   Objective:   VITALS:   Vitals:   04/14/24 1411 04/14/24 2024 04/14/24 2333 04/15/24 0349  BP: (!) 142/42 (!) 166/53 (!) 158/59 (!) 150/64  Pulse:   72 69  Resp:   20 18  Temp: 99.5 F (37.5 C) 98.4 F (36.9 C) 98.4 F (36.9 C) 98 F (36.7 C)  TempSrc: Oral Oral Oral Oral  SpO2:   92%   Weight:      Height:        Well-appearing gentleman no acute distress.  His incisional wound VAC is in place.  His left thigh is swollen but compressible.  No calf tenderness.  Intact sensation in the saphenous, sural, tibial, and peroneal nerve distributions.  5/5 strength EHL, FHL, gastrocs, tibialis anterior   Lab Results  Component Value Date   WBC 7.8 04/15/2024   HGB 7.5 (L) 04/15/2024   HCT 21.7 (L) 04/15/2024   MCV 87.9 04/15/2024   PLT 68 (L) 04/15/2024   BMET    Component Value Date/Time   NA 135 04/15/2024 0540   NA 138 01/28/2016 0804   K 4.5 04/15/2024 0540   K 4.7 01/28/2016 0804   CL 102 04/15/2024 0540   CL 103 08/30/2012 0901   CO2 24 04/15/2024 0540   CO2 24 01/28/2016 0804   GLUCOSE 158 (H) 04/15/2024 0540   GLUCOSE 263 (H) 01/28/2016 0804   GLUCOSE 155 (H) 08/30/2012 0901   BUN 52 (H) 04/15/2024 0540   BUN 23.4 01/28/2016 0804   CREATININE 1.72 (H) 04/15/2024 0540   CREATININE 1.71 (H) 02/04/2024 0911   CREATININE 1.2 01/28/2016 0804   CALCIUM  8.1 (L) 04/15/2024 0540   CALCIUM  9.1 01/28/2016 0804   EGFR 61 (L) 01/28/2016 0804   GFRNONAA 41 (L) 04/15/2024 0540   GFRNONAA 41 (L) 02/04/2024 0911       Assessment/Plan: 3 Days Post-Op   Principal Problem:   Periprosthetic hip fracture, initial encounter Active Problems:   Essential hypertension   Thrombocytopenia   IDA (iron   deficiency anemia)   Generalized anxiety disorder   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  His incisional wound VAC is holding nicely and there is minimal output.  He has been limited in his ability placement with physical therapy.  His H&H seems to have stabilized around 7.6.  This is only slight drop from yesterday.  Platelets are still low.  We will continue to try to mobilize him with physical therapy.  He is nonweightbearing on the left leg.  We will continue to monitor his H&H.  Again I would hold on Lovenox  due to the low platelets but we can resume his Plavix  starting today.  Separately his creatinine is still elevated though slightly better.  I defer to medicine for further management of this.  Weightbearing: Nonweightbearing left leg  DVT prophylaxis: Can restart Plavix  today     Cordella SHAUNNA Rhein 04/15/2024, 8:22 AM   Cordella Rhein, MD, MS [facility] / [place] [phone]   Contact information:   Tzzxijbd 7am-5pm epic  message Dr. Reyne, or call office for patient follow up: [phone] After hours and holidays please check [email or web address] for group call information for [facility]   "

## 2024-04-15 NOTE — Progress Notes (Signed)
 Physical Therapy Treatment Patient Details Name: Dennis Wise MRN: [id number] DOB: 1950/11/02 Today's Date: 04/15/2024   History of Present Illness 73 yo M adm 12/22 due to fall with left periprosthetic femur fracture around long hip stem s/p ORIF L femur. PMH 01/2024 fall with L THA and ORIF L elbow, NAFLD, ischemc R PCA stroke (2024), HTN, DM2, hearing loss, C2-T2 fusion (12/2023).    PT Comments  Pt received in supine and eager for mobility. Pt appears confused about situation during education on precautions and states WB restrictions are different than they told me, although pt's wife reports pt is remembering previous injury. LLE exercise performed at beginning of session with pt demonstrating improved quad activation with increased reps. Pt able to tolerate sitting EOB this session, although requires increased time and max-total A +2 for bed mobility due to pain. Once sitting EOB, pain appears to improve and pt able to tolerate RLE exercise. Pt unable to perform LAQ with LLE sitting EOB. Pt able to perform a few small lateral scoots towards HOB with max A +2. Pt unable to initiate return to supine due to pain and requires total A +2 via helicopter method. Discussion with pt's wife at end of session about discharge plans with pt's wife reporting she wishes to take pt home. Education provided on current PT recommendations and safety due to pt's current level of assist. Pt continues to benefit from PT services to progress toward functional mobility goals.    If plan is discharge home, recommend the following: Two people to help with walking and/or transfers;Two people to help with bathing/dressing/bathroom   Can travel by private vehicle     No  Equipment Recommendations  Other (comment) (defer)    Recommendations for Other Services       Precautions / Restrictions Precautions Precautions: Fall;Posterior Hip Precaution/Restrictions Comments: Visual deficits ( L field cut), wound  vac Restrictions Weight Bearing Restrictions Per Provider Order: Yes LLE Weight Bearing Per Provider Order: Non weight bearing Other Position/Activity Restrictions: no WB or ROM restricitons with L elbow - cleared by [facility] 12/24     Mobility  Bed Mobility Overal bed mobility: Needs Assistance Bed Mobility: Supine to Sit, Sit to Supine     Supine to sit: Max assist, +2 for physical assistance, Used rails, HOB elevated Sit to supine: Total assist, +2 for physical assistance   General bed mobility comments: Exit to L EOB with step by step cues for sequencing. Pt able to grap rail with RUE. Assist for LLE management, trunk elevation, and scooting hips to EOB. Pt unable to initiate return to supine due to increased pain, so helicopter method utilized    Transfers Overall transfer level: Needs assistance   Transfers: Bed to chair/wheelchair/BSC            Lateral/Scoot Transfers: Max assist, +2 physical assistance General transfer comment: Pt able to perform x3 lateral scoots to L towards HOB with max A +2 with bedpad and L heel floated to ensure NWB    Ambulation/Gait                   Stairs             Wheelchair Mobility     Tilt Bed    Modified Rankin (Stroke Patients Only)       Balance Overall balance assessment: History of Falls, Needs assistance Sitting-balance support: Bilateral upper extremity supported, Feet supported, Single extremity supported Sitting balance-Leahy Scale: Poor Sitting balance -  Comments: reliant on UE support and CGA                                    Communication Communication Communication: Impaired Factors Affecting Communication: Hearing impaired  Cognition Arousal: Alert Behavior During Therapy: WFL for tasks assessed/performed, Restless   PT - Cognitive impairments: Awareness, Safety/Judgement, Problem solving, Sequencing                       PT - Cognition Comments: Pt appears  confused and reporting he has sees construction going on, although none is occurring. Pt also states he doesn't know which is the floor and which is the ceiling Following commands: Impaired Following commands impaired: Follows one step commands inconsistently, Follows one step commands with increased time    Cueing Cueing Techniques: Verbal cues  Exercises General Exercises - Lower Extremity Ankle Circles/Pumps: AROM, Supine, Both, 10 reps Long Arc Quad: AROM, Seated, Right, 10 reps, AAROM, Left, 5 reps (AAROM LLE x5) Heel Slides: AAROM, Supine, Left, 5 reps    General Comments        Pertinent Vitals/Pain Pain Assessment Pain Assessment: Faces Faces Pain Scale: Hurts worst Pain Location: LLE Pain Descriptors / Indicators: Other (Comment), Operative site guarding, Grimacing, Sharp (yelling) Pain Intervention(s): Limited activity within patient's tolerance, Premedicated before session, Monitored during session, Repositioned     PT Goals (current goals can now be found in the care plan section) Acute Rehab PT Goals Patient Stated Goal: less pain PT Goal Formulation: With patient Time For Goal Achievement: 04/27/24 Progress towards PT goals: Progressing toward goals    Frequency    Min 2X/week          AM-PAC PT 6 Clicks Mobility   Outcome Measure  Help needed turning from your back to your side while in a flat bed without using bedrails?: Total Help needed moving from lying on your back to sitting on the side of a flat bed without using bedrails?: Total Help needed moving to and from a bed to a chair (including a wheelchair)?: Total Help needed standing up from a chair using your arms (e.g., wheelchair or bedside chair)?: Total Help needed to walk in hospital room?: Total Help needed climbing 3-5 steps with a railing? : Total 6 Click Score: 6    End of Session   Activity Tolerance: Patient limited by pain Patient left: in bed;with call bell/phone within  reach;with bed alarm set;with family/visitor present Nurse Communication: Mobility status PT Visit Diagnosis: Pain;Other abnormalities of gait and mobility (R26.89);History of falling (Z91.81) Pain - Right/Left: Left Pain - part of body: Hip     Time: 8865-8795 PT Time Calculation (min) (ACUTE ONLY): 30 min  Charges:    $Therapeutic Exercise: 8-22 mins $Therapeutic Activity: 8-22 mins PT General Charges $$ ACUTE PT VISIT: 1 Visit                    [name], PTA Acute Rehabilitation Services Secure Chat Preferred  [phone]    [name] 04/15/2024, 1:29 PM

## 2024-04-15 NOTE — Progress Notes (Signed)
 "                                                                         Orthopaedic Trauma Service Progress Note  Patient ID: Dennis Wise MRN: [id number] DOB/AGE: [birth year] 73 y.o.  Subjective:  Ortho issues stable Labs appear to be stabilizing   Feels like he needs to have BM but wanting to sit on [facility] or toilet and not use a bedpan   Appear to be a little confused but able to have a reasonable conversation with him   Left olecranon fracture from October is healed.  He has no restrictions with respect to his left elbow   ROS As above  Today's  total administered Morphine  Milligram Equivalents: 0 Yesterday's total administered Morphine  Milligram Equivalents: 50  Objective:   VITALS:   Vitals:   04/14/24 2024 04/14/24 2333 04/15/24 0349 04/15/24 0839  BP: (!) 166/53 (!) 158/59 (!) 150/64 (!) 155/54  Pulse:  72 69   Resp:  20 18   Temp: 98.4 F (36.9 C) 98.4 F (36.9 C) 98 F (36.7 C) 97.7 F (36.5 C)  TempSrc: Oral Oral Oral Oral  SpO2:  92%    Weight:      Height:        Estimated body mass index is 31.19 kg/m as calculated from the following:   Height as of this encounter: 6' 2 (1.88 m).   Weight as of this encounter: 110.2 kg.   Intake/Output      12/25 0701 12/26 0700 12/26 0701 12/27 0700   P.O. 400    Blood 431.7    Total Intake(mL/kg) 831.7 (7.5)    Urine (mL/kg/hr) 0 (0)    Emesis/NG output     Drains 330    Stool 0    Total Output 330    Net +501.7         Urine Occurrence 7 x    Stool Occurrence 2 x      LABS  Results for orders placed or performed during the hospital encounter of 04/11/24 (from the past 24 hours)  Glucose, capillary     Status: Abnormal   Collection Time: 04/14/24 11:26 AM  Result Value Ref Range   Glucose-Capillary 130 (H) 70 - 99 mg/dL  Glucose, capillary     Status: Abnormal   Collection Time: 04/14/24  4:19 PM  Result Value Ref Range   Glucose-Capillary 151 (H) 70 - 99 mg/dL  Glucose, capillary     Status:  Abnormal   Collection Time: 04/14/24  9:26 PM  Result Value Ref Range   Glucose-Capillary 160 (H) 70 - 99 mg/dL  Basic metabolic panel     Status: Abnormal   Collection Time: 04/15/24  5:40 AM  Result Value Ref Range   Sodium 135 135 - 145 mmol/L   Potassium 4.5 3.5 - 5.1 mmol/L   Chloride 102 98 - 111 mmol/L   CO2 24 22 - 32 mmol/L   Glucose, Bld 158 (H) 70 - 99 mg/dL   BUN 52 (H) 8 - 23 mg/dL   Creatinine, Ser 8.27 (H) 0.61 - 1.24 mg/dL   Calcium  8.1 (L) 8.9 - 10.3 mg/dL   GFR, Estimated 41 (L) >60  mL/min   Anion gap 9 5 - 15  CBC     Status: Abnormal   Collection Time: 04/15/24  5:40 AM  Result Value Ref Range   WBC 7.8 4.0 - 10.5 K/uL   RBC 2.47 (L) 4.22 - 5.81 MIL/uL   Hemoglobin 7.5 (L) 13.0 - 17.0 g/dL   HCT 78.2 (L) 60.9 - 47.9 %   MCV 87.9 80.0 - 100.0 fL   MCH 30.4 26.0 - 34.0 pg   MCHC 34.6 30.0 - 36.0 g/dL   RDW 83.5 (H) 88.4 - 84.4 %   Platelets 68 (L) 150 - 400 K/uL   nRBC 0.0 0.0 - 0.2 %  Glucose, capillary     Status: Abnormal   Collection Time: 04/15/24  8:41 AM  Result Value Ref Range   Glucose-Capillary 160 (H) 70 - 99 mg/dL     PHYSICAL EXAM:   Gen: sitting up in bed, NAD Lungs: unlabored Ext:       Left Lower extremity   Incisional vac in place, good seal    Scant bloody drainage in canister  Moderate swelling to left thigh but compressible and not overly tense   TED hose in place  Ext warm   + DP pulse  EHL, FHL, lesser toe motor intact  Ankle flex, ext, inv and evr intact  DPN, SPN, TN sensation intact   No DCT    Assessment/Plan: 3 Days Post-Op   Principal Problem:   Periprosthetic hip fracture, initial encounter Active Problems:   Essential hypertension   Thrombocytopenia   IDA (iron  deficiency anemia)   Generalized anxiety disorder   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 04/12/24 9164 04/12/24 1814     .  POD/HD#: 58  73 y/o male s/p fall with Left periprosthetic proximal femur fracture   - fall   - L periprosthetic proximal femur fracture s/p ORIF   NWB L leg x 6-8 weeks  Total hip precautions, posterior   No knee ROM restrictions   Incisional vac to remain on until dc   Continue with TED hose   Ice and elevate   Therapy evals   TOC for SNF  - L olecranon fracture October 2025 s/p ORIF  Fracture is healed  No restrictions to L UEx    - Pain management:  Multimodal   Minimize narcotics  Delirium precautions   - ABL anemia/Hemodynamics  H/H appear to be stabilizing   Continue to monitor   - Medical issues   Per primary   - DVT/PE prophylaxis:  Continue to hold lovenox    SCDs and TED   Think it is ok to restart plavix  from ortho standpoint if medicine feels its appropriate    - ID:   Periop abx completed   - Metabolic Bone Disease:  Vitamin d  deficiency    Supplement   - Activity:  As above  - Impediments to fracture healing:  Vitamin d  deficiency   Multiple fragility fractures   DM  CKD  Multiple chronic medical issues   - Dispo:  Ortho issues appear to be stabilizing   Therapies   TOC for SNF     Francis MICAEL Mt, PA-C [phone] (C) 04/15/2024, 10:09 AM  [facility] [address] [place] [address] [phone] [id number] (F)    After 5pm and on the weekends please log on to Amion, go to orthopaedics and the look under the [facility] Call for the provider(s) on call. You can also call our office at [phone] and then follow the prompts to be connected  to the call team.  Patient ID: Dennis Wise, male   DOB: 1951-03-20, 73 y.o.   MRN: [id number]  "

## 2024-04-15 NOTE — Plan of Care (Signed)

## 2024-04-15 NOTE — Plan of Care (Signed)
  Problem: Education: Goal: Ability to describe self-care measures that may prevent or decrease complications (Diabetes Survival Skills Education) will improve Outcome: Progressing Goal: Individualized Educational Video(s) Outcome: Progressing   Problem: Coping: Goal: Ability to adjust to condition or change in health will improve Outcome: Progressing   Problem: Fluid Volume: Goal: Ability to maintain a balanced intake and output will improve Outcome: Progressing   Problem: Health Behavior/Discharge Planning: Goal: Ability to identify and utilize available resources and services will improve Outcome: Progressing Goal: Ability to manage health-related needs will improve Outcome: Progressing   Problem: Metabolic: Goal: Ability to maintain appropriate glucose levels will improve Outcome: Progressing   Problem: Nutritional: Goal: Maintenance of adequate nutrition will improve Outcome: Progressing Goal: Progress toward achieving an optimal weight will improve Outcome: Progressing   Problem: Skin Integrity: Goal: Risk for impaired skin integrity will decrease Outcome: Progressing   Problem: Tissue Perfusion: Goal: Adequacy of tissue perfusion will improve Outcome: Progressing   Problem: Education: Goal: Knowledge of General Education information will improve Description: Including pain rating scale, medication(s)/side effects and non-pharmacologic comfort measures Outcome: Progressing   Problem: Health Behavior/Discharge Planning: Goal: Ability to manage health-related needs will improve Outcome: Progressing   Problem: Clinical Measurements: Goal: Ability to maintain clinical measurements within normal limits will improve Outcome: Progressing Goal: Will remain free from infection Outcome: Progressing Goal: Diagnostic test results will improve Outcome: Progressing Goal: Respiratory complications will improve Outcome: Progressing Goal: Cardiovascular complication will  be avoided Outcome: Progressing   Problem: Activity: Goal: Risk for activity intolerance will decrease Outcome: Progressing   Problem: Nutrition: Goal: Adequate nutrition will be maintained Outcome: Progressing   Problem: Coping: Goal: Level of anxiety will decrease Outcome: Progressing   Problem: Elimination: Goal: Will not experience complications related to bowel motility Outcome: Progressing Goal: Will not experience complications related to urinary retention Outcome: Progressing   Problem: Pain Managment: Goal: General experience of comfort will improve and/or be controlled Outcome: Progressing   Problem: Safety: Goal: Ability to remain free from injury will improve Outcome: Progressing   Problem: Skin Integrity: Goal: Risk for impaired skin integrity will decrease Outcome: Progressing   Problem: Education: Goal: Verbalization of understanding the information provided (i.e., activity precautions, restrictions, etc) will improve Outcome: Progressing Goal: Individualized Educational Video(s) Outcome: Progressing   Problem: Activity: Goal: Ability to ambulate and perform ADLs will improve Outcome: Progressing   Problem: Clinical Measurements: Goal: Postoperative complications will be avoided or minimized Outcome: Progressing   Problem: Self-Concept: Goal: Ability to maintain and perform role responsibilities to the fullest extent possible will improve Outcome: Progressing   Problem: Pain Management: Goal: Pain level will decrease Outcome: Progressing

## 2024-04-15 NOTE — Care Management Important Message (Signed)
 Important Message  Patient Details  Name: Dennis Wise MRN: [id number] Date of Birth: 01-Jan-1951   Important Message Given:  Yes - Medicare IM     [name] 04/15/2024, 3:19 PM

## 2024-04-16 DIAGNOSIS — Z96649 Presence of unspecified artificial hip joint: Secondary | ICD-10-CM | POA: Diagnosis not present

## 2024-04-16 DIAGNOSIS — M978XXA Periprosthetic fracture around other internal prosthetic joint, initial encounter: Secondary | ICD-10-CM | POA: Diagnosis not present

## 2024-04-16 LAB — BASIC METABOLIC PANEL WITH GFR
Anion gap: 9 (ref 5–15)
BUN: 65 mg/dL — ABNORMAL HIGH (ref 8–23)
CO2: 24 mmol/L (ref 22–32)
Calcium: 8.2 mg/dL — ABNORMAL LOW (ref 8.9–10.3)
Chloride: 101 mmol/L (ref 98–111)
Creatinine, Ser: 1.77 mg/dL — ABNORMAL HIGH (ref 0.61–1.24)
GFR, Estimated: 40 mL/min — ABNORMAL LOW
Glucose, Bld: 178 mg/dL — ABNORMAL HIGH (ref 70–99)
Potassium: 4.3 mmol/L (ref 3.5–5.1)
Sodium: 134 mmol/L — ABNORMAL LOW (ref 135–145)

## 2024-04-16 LAB — MAGNESIUM: Magnesium: 1.9 mg/dL (ref 1.7–2.4)

## 2024-04-16 LAB — CBC WITH DIFFERENTIAL/PLATELET
Abs Immature Granulocytes: 0.07 K/uL (ref 0.00–0.07)
Basophils Absolute: 0 K/uL (ref 0.0–0.1)
Basophils Relative: 0 %
Eosinophils Absolute: 0.1 K/uL (ref 0.0–0.5)
Eosinophils Relative: 2 %
HCT: 20.5 % — ABNORMAL LOW (ref 39.0–52.0)
Hemoglobin: 7.2 g/dL — ABNORMAL LOW (ref 13.0–17.0)
Immature Granulocytes: 1 %
Lymphocytes Relative: 12 %
Lymphs Abs: 0.8 K/uL (ref 0.7–4.0)
MCH: 31 pg (ref 26.0–34.0)
MCHC: 35.1 g/dL (ref 30.0–36.0)
MCV: 88.4 fL (ref 80.0–100.0)
Monocytes Absolute: 0.6 K/uL (ref 0.1–1.0)
Monocytes Relative: 10 %
Neutro Abs: 4.8 K/uL (ref 1.7–7.7)
Neutrophils Relative %: 75 %
Platelets: 75 K/uL — ABNORMAL LOW (ref 150–400)
RBC: 2.32 MIL/uL — ABNORMAL LOW (ref 4.22–5.81)
RDW: 16.1 % — ABNORMAL HIGH (ref 11.5–15.5)
WBC: 6.4 K/uL (ref 4.0–10.5)
nRBC: 0 % (ref 0.0–0.2)

## 2024-04-16 LAB — PHOSPHORUS: Phosphorus: 3.2 mg/dL (ref 2.5–4.6)

## 2024-04-16 LAB — GLUCOSE, CAPILLARY
Glucose-Capillary: 162 mg/dL — ABNORMAL HIGH (ref 70–99)
Glucose-Capillary: 143 mg/dL — ABNORMAL HIGH (ref 70–99)
Glucose-Capillary: 164 mg/dL — ABNORMAL HIGH (ref 70–99)

## 2024-04-16 LAB — PREPARE RBC (CROSSMATCH)

## 2024-04-16 MED ORDER — SODIUM CHLORIDE 0.9% IV SOLUTION: Freq: Once | INTRAVENOUS | Status: DC

## 2024-04-16 MED ORDER — FUROSEMIDE 10 MG/ML IJ SOLN
20.0000 mg | Freq: Once | INTRAMUSCULAR | Status: AC
  Administered 2024-04-16: 20 mg via INTRAVENOUS
  Filled 2024-04-16: qty 2

## 2024-04-16 MED ORDER — HYDROCHLOROTHIAZIDE 12.5 MG PO TABS
12.5000 mg | ORAL_TABLET | Freq: Every day | ORAL | Status: DC
  Administered 2024-04-17 – 2024-04-20 (×4): 12.5 mg via ORAL
  Filled 2024-04-16: qty 1

## 2024-04-16 MED ORDER — TRANEXAMIC ACID-NACL 1000-0.7 MG/100ML-% IV SOLN
1000.0000 mg | Freq: Once | INTRAVENOUS | Status: AC
  Administered 2024-04-16: 1000 mg via INTRAVENOUS
  Filled 2024-04-16: qty 100

## 2024-04-16 NOTE — Progress Notes (Signed)
 " PROGRESS NOTE Dennis Wise    DOB: 06-28-1950, 73 y.o.  FMW:[id number]    Code Status: Full Code   DOA: 04/11/2024   LOS: 5  Brief hospital course  Dennis Wise is a 73 y.o. male with a PMH significant for CVA on aspirin /Plavix , anemia of chronic disease and iron  deficiency, chronic thrombocytopenia, CKD stage IIIa, HTN, HLD, T2DM, GAD, s/p left elbow ORIF 02/08/2024 and left THA 02/10/2024 who is admitted for left periprosthetic hip fracture. Ortho brought back to OR for fixation 12/23 and is recovering as expected with wound vac in place. Ortho surgery has been following for this PT/OT are working with him and recommending SNF.  This admission is complicated by his anemia and thrombocytopenia with splenomegaly.  Hematology has been following for this.   Assessment & Plan   Acute left periprosthetic hip fracture: s/p ORIF left femur 12/23  - ortho following, no weightbearing in left leg, ongoing postop blood loss, question if wound VAC is making it worse, have requested Ortho to readdress, SCDs for DVT prophylaxis for ongoing bleeding.  Continue PT OT.  Will require SNF.  Left olecranon fracture- s/p fixation - OT, PT - keep elevated - analgesia PRN   Acute on chronic anemia-  S/p 3 platelete, 1 ffp, and 10 units of packed RBC this admission so far getting 2 more units on 04/16/2024, thrombocytopenia presumed secondary to splenic sequestration, seen by hematology, I think his bleeding is being augmented by the wound VAC however will defer this to orthopedics have messaged him to reevaluate if wound VAC can be removed.  Getting 2 more units of packed RBC on 04/16/2024.    Thrombocytopenia secondary to splenomegaly - Set platelet transfusion threshold at <50,000/?L unless active bleeding. - Please avoid unnecessary platelet transfusions to minimize alloimmunization and transfusion reaction risk. - Monitor   CKD stage IIIa Mild hyperkalemia: resolved Renal function relatively stable.   Continue to monitor.    History of CVA: Hold Plavix  for now, still has significant blood in wound VAC    Hypertension: BP well-controlled.  Continue losartan  and HCTZ.   Hyperlipidemia: Continue atorvastatin .   Mild vascular neurocognitive disorder GAD: Stable, continue Lexapro  and Namenda .keep on  delirium precautions.   Class I Obesity w/ Body mass index is 31.19 kg/m.: Will benefit with PCP follow-up, weight loss,healthy lifestyle and outpatient eval  Type 2 diabetes: Poorly controlled hyperglycemia.PTA on glipizide metformin  Actos , holding p.o. meds.On SSI 0 to 15 units, add Semglee  10 units and novolog  2 u premeal   Lab Results  Component Value Date   HGBA1C 5.6 03/24/2024   CBG (last 3)  Recent Labs    04/15/24 1224 04/15/24 1604 04/16/24 0821  GLUCAP 168* 191* 162*     Body mass index is 31.19 kg/m.  VTE ppx: SCDs Start: 04/12/24 2251  Diet:     Diet   Diet Carb Modified Room service appropriate? Yes   Consultants: Ortho surgery  Heme/onc  Subjective 04/16/2024    Patient in bed, appears comfortable, denies any headache, no fever, no chest pain or pressure, no shortness of breath , no abdominal pain. No focal weakness.   Objective  Blood pressure (!) 159/53, pulse 70, temperature 98.6 F (37 C), temperature source Oral, resp. rate 11, height 6' 2 (1.88 m), weight 110.2 kg, SpO2 99%.  Intake/Output Summary (Last 24 hours) at 04/16/2024 0851 Last data filed at 04/16/2024 0507 Gross per 24 hour  Intake --  Output 1300 ml  Net -1300  ml   Filed Weights   04/11/24 2034 04/12/24 1143  Weight: 122.5 kg 110.2 kg    Physical Exam:   Awake Alert, No new F.N deficits, Normal affect [place].AT,PERRAL Supple Neck, No JVD,   Symmetrical Chest wall movement, Good air movement bilaterally, CTAB RRR,No Gallops, Rubs or new Murmurs,  +ve B.Sounds, Abd Soft, No tenderness,   Left hip postop site appears stable, wound VAC has some bloody  discharge   Inpatient Medications  Scheduled Meds:  sodium chloride    Intravenous Once   sodium chloride    Intravenous Once   sodium chloride    Intravenous Once   vitamin C   1,000 mg Oral Daily   atorvastatin   10 mg Oral QHS   cholecalciferol   2,000 Units Oral BID   escitalopram   10 mg Oral Daily   furosemide   20 mg Intravenous Once   [START ON 04/17/2024] hydrochlorothiazide   12.5 mg Oral Daily   insulin  aspart  0-15 Units Subcutaneous TID WC   insulin  aspart  2 Units Subcutaneous TID WC   insulin  glargine  10 Units Subcutaneous Daily   losartan   100 mg Oral Daily   memantine   5 mg Oral BID   multivitamin with minerals  1 tablet Oral Daily   nutrition supplement (JUVEN)  1 packet Oral BID BM   polyethylene glycol  17 g Oral BID   Ensure Max Protein  11 oz Oral BID BM   tamsulosin   0.4 mg Oral QHS   Continuous Infusions: PRN Meds:.acetaminophen , HYDROcodone -acetaminophen , HYDROmorphone  (DILAUDID ) injection, menthol  **OR** phenol, metoCLOPramide  **OR** metoCLOPramide  (REGLAN ) injection, ondansetron  (ZOFRAN ) IV  DVT Prophylaxis  SCDs Start: 04/12/24 2251       Recent Labs  Lab 04/11/24 2044 04/12/24 0344 04/12/24 2309 04/13/24 1024 04/13/24 2205 04/14/24 0223 04/15/24 0540 04/16/24 0607  WBC 5.4   < > 6.9 8.5  --  7.5 7.8 6.4  HGB 8.0*   < > 6.7* 7.6* 8.3* 7.9* 7.5* 7.2*  HCT 24.2*   < > 19.3* 21.5* 23.1* 22.2* 21.7* 20.5*  PLT 94*   < > 85* 77*  --  65* 68* 75*  MCV 91.0   < > 88.1 86.0  --  86.7 87.9 88.4  MCH 30.1   < > 30.6 30.4  --  30.9 30.4 31.0  MCHC 33.1   < > 34.7 35.3  --  35.6 34.6 35.1  RDW 18.4*   < > 16.7* 16.4*  --  16.4* 16.4* 16.1*  LYMPHSABS 0.7  --   --  0.8  --  0.8  --  0.8  MONOABS 0.4  --   --  1.1*  --  0.9  --  0.6  EOSABS 0.1  --   --  0.0  --  0.1  --  0.1  BASOSABS 0.0  --   --  0.0  --  0.0  --  0.0   < > = values in this interval not displayed.    Recent Labs  Lab 04/11/24 2044 04/12/24 0344 04/12/24 1330 04/12/24 1446  04/12/24 1626 04/12/24 2309 04/14/24 0223 04/15/24 0540 04/16/24 0607  NA 136   < > 140   < > 138 138 136 135 134*  K 4.9   < > 5.3*   < > 4.3 5.2* 4.3 4.5 4.3  CL 102   < > 106  --   --  105 102 102 101  CO2 25   < > 24  --   --  23 25  24 24  ANIONGAP 9   < > 10  --   --  10 9 9 9   GLUCOSE 197*   < > 250*  --   --  250* 157* 158* 178*  BUN 39*   < > 38*  --   --  38* 49* 52* 65*  CREATININE 1.40*   < > 1.60*  --   --  1.58* 1.93* 1.72* 1.77*  INR 1.0  --   --   --   --   --  1.1  --   --   MG  --   --   --   --   --   --   --   --  1.9  PHOS  --   --   --   --   --   --   --   --  3.2  CALCIUM  8.6*   < > 8.2*  --   --  8.0* 7.9* 8.1* 8.2*   < > = values in this interval not displayed.      Recent Labs  Lab 04/11/24 2044 04/12/24 0344 04/12/24 1330 04/12/24 2309 04/14/24 0223 04/15/24 0540 04/16/24 0607  INR 1.0  --   --   --  1.1  --   --   MG  --   --   --   --   --   --  1.9  CALCIUM  8.6*   < > 8.2* 8.0* 7.9* 8.1* 8.2*   < > = values in this interval not displayed.    --------------------------------------------------------------------------------------------------------------- Lab Results  Component Value Date   CHOL 165 02/19/2023   HDL 97.00 02/19/2023   LDLCALC 53 02/19/2023   LDLDIRECT 74.0 10/16/2015   TRIG 77.0 02/19/2023   CHOLHDL 2 02/19/2023    Lab Results  Component Value Date   HGBA1C 5.6 03/24/2024   No results for input(s): TSH, T4TOTAL, FREET4, T3FREE, THYROIDAB in the last 72 hours. No results for input(s): VITAMINB12, FOLATE, FERRITIN, TIBC, IRON , RETICCTPCT in the last 72 hours. ------------------------------------------------------------------------------------------------------------------ Cardiac Enzymes No results for input(s): CKMB, TROPONINI, MYOGLOBIN in the last 168 hours.  Invalid input(s): CK  Micro Results No results found for this or any previous visit (from the past 240 hours).  Radiology  Reports  No results found.    Signature  -   Lavada Stank M.D on 04/16/2024 at 8:51 AM   -  To page go to [email or web address]       "

## 2024-04-16 NOTE — Plan of Care (Signed)

## 2024-04-16 NOTE — Progress Notes (Signed)
 "    4 Days Post-Op Procedures (LRB): OPEN REDUCTION INTERNAL FIXATION FEMUR FRACTURE (Left) Subjective:  Patient reports pain as mild to moderate depending on activity.  Slept okay.  No overnight events.  Denies chest pain, ShOB, N/V.  Denies numbness or tingling.  Continuing with PT.  FM has not seen major or intermediate drainage into wound vac.  Notices a drop travel through hose every few hours, steady.  No other complaints at this time.  Objective:   VITALS:   Vitals:   04/16/24 0400 04/16/24 0822 04/16/24 1112 04/16/24 1127  BP: (!) 154/54 (!) 157/56 (!) 158/51 (!) 165/55  Pulse: (!) 59 60 (!) 55 (!) 56  Resp: 11 14 18 18   Temp: 98.3 F (36.8 C) 98.3 F (36.8 C) 98.6 F (37 C) 98.5 F (36.9 C)  TempSrc: Oral Oral Oral Oral  SpO2:   93% 93%  Weight:      Height:        Gen: sitting up in bed, NAD Lungs: unlabored Ext:       Left Lower extremity              Incisional vac in place, good seal                          Scant bloody drainage in canister -- Was previously marked at 100 cc line, still at this line, unclear when last checked             Moderate swelling to left thigh but compressible and not overly tense              TED hose in place             Ext warm, dry Decent cap refill toes             EHL, FHL, lesser toe motor intact             Ankle flex, ext, inv and evr intact             DPN, SPN, TN sensation intact              No DCT   Lab Results  Component Value Date   WBC 6.4 04/16/2024   HGB 7.2 (L) 04/16/2024   HCT 20.5 (L) 04/16/2024   MCV 88.4 04/16/2024   PLT 75 (L) 04/16/2024   BMET    Component Value Date/Time   NA 134 (L) 04/16/2024 0607   NA 138 01/28/2016 0804   K 4.3 04/16/2024 0607   K 4.7 01/28/2016 0804   CL 101 04/16/2024 0607   CL 103 08/30/2012 0901   CO2 24 04/16/2024 0607   CO2 24 01/28/2016 0804   GLUCOSE 178 (H) 04/16/2024 0607   GLUCOSE 263 (H) 01/28/2016 0804   GLUCOSE 155 (H) 08/30/2012 0901   BUN 65 (H)  04/16/2024 0607   BUN 23.4 01/28/2016 0804   CREATININE 1.77 (H) 04/16/2024 0607   CREATININE 1.71 (H) 02/04/2024 0911   CREATININE 1.2 01/28/2016 0804   CALCIUM  8.2 (L) 04/16/2024 0607   CALCIUM  9.1 01/28/2016 0804   EGFR 61 (L) 01/28/2016 0804   GFRNONAA 40 (L) 04/16/2024 0607   GFRNONAA 41 (L) 02/04/2024 0911    Yesterday's total administered Morphine  Milligram Equivalents: 10  Xray: stable post-operative imaging  Assessment/Plan: 4 Days Post-Op   Principal Problem:   Periprosthetic hip fracture, initial encounter Active Problems:   Essential hypertension  Thrombocytopenia   IDA (iron  deficiency anemia)   Generalized anxiety disorder   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  Can continue wound VAC.             Continue with TED hose              Ice and elevate              Therapy evals              TOC for SNF   - L olecranon fracture October 2025 s/p ORIF             Fracture is healed             No restrictions to L UEx      - Pain management:             Multimodal              Minimize narcotics             Delirium precautions    - ABL anemia/Hemodynamics             H/H appear to be stabilizing -- 7.2 this morning, getting 1-2 units during exam today             Continue to monitor    - Medical issues              Per primary    - DVT/PE prophylaxis:             Continue to hold lovenox               SCDs and TED              Think it is ok to restart plavix  from ortho standpoint if medicine feels its appropriate -- Hold Plavix   today and re-evaluate Sunday/Monday              - ID:              Periop abx completed    - Metabolic Bone Disease:             Vitamin d  deficiency                          Supplement    - Activity:             As above   - Impediments to fracture healing:             Vitamin d  deficiency              Multiple fragility fractures              DM             CKD  Multiple chronic medical issues    - Dispo:             Ortho issues appear to be stabilizing              Therapies              TOC for SNF    Dennis Wise 04/16/2024, 12:21 PM   Contact information:   Weekdays 7am-5pm epic message Dr. Edna, or call office for patient follow up: [phone] After hours and holidays please check [email or web address] for group call information for [facility]    "

## 2024-04-17 ENCOUNTER — Inpatient Hospital Stay (HOSPITAL_COMMUNITY)

## 2024-04-17 DIAGNOSIS — Z96649 Presence of unspecified artificial hip joint: Secondary | ICD-10-CM | POA: Diagnosis not present

## 2024-04-17 DIAGNOSIS — M978XXA Periprosthetic fracture around other internal prosthetic joint, initial encounter: Secondary | ICD-10-CM | POA: Diagnosis not present

## 2024-04-17 LAB — GLUCOSE, CAPILLARY
Glucose-Capillary: 200 mg/dL — ABNORMAL HIGH (ref 70–99)
Glucose-Capillary: 199 mg/dL — ABNORMAL HIGH (ref 70–99)
Glucose-Capillary: 207 mg/dL — ABNORMAL HIGH (ref 70–99)
Glucose-Capillary: 208 mg/dL — ABNORMAL HIGH (ref 70–99)

## 2024-04-17 LAB — BASIC METABOLIC PANEL WITH GFR
Anion gap: 9 (ref 5–15)
BUN: 64 mg/dL — ABNORMAL HIGH (ref 8–23)
CO2: 24 mmol/L (ref 22–32)
Calcium: 8.1 mg/dL — ABNORMAL LOW (ref 8.9–10.3)
Chloride: 101 mmol/L (ref 98–111)
Creatinine, Ser: 1.53 mg/dL — ABNORMAL HIGH (ref 0.61–1.24)
GFR, Estimated: 47 mL/min — ABNORMAL LOW
Glucose, Bld: 210 mg/dL — ABNORMAL HIGH (ref 70–99)
Potassium: 4.1 mmol/L (ref 3.5–5.1)
Sodium: 134 mmol/L — ABNORMAL LOW (ref 135–145)

## 2024-04-17 LAB — CBC WITH DIFFERENTIAL/PLATELET
Abs Immature Granulocytes: 0.06 K/uL (ref 0.00–0.07)
Basophils Absolute: 0 K/uL (ref 0.0–0.1)
Basophils Relative: 0 %
Eosinophils Absolute: 0.3 K/uL (ref 0.0–0.5)
Eosinophils Relative: 5 %
HCT: 22.4 % — ABNORMAL LOW (ref 39.0–52.0)
Hemoglobin: 7.9 g/dL — ABNORMAL LOW (ref 13.0–17.0)
Immature Granulocytes: 1 %
Lymphocytes Relative: 13 %
Lymphs Abs: 0.7 K/uL (ref 0.7–4.0)
MCH: 31.1 pg (ref 26.0–34.0)
MCHC: 35.3 g/dL (ref 30.0–36.0)
MCV: 88.2 fL (ref 80.0–100.0)
Monocytes Absolute: 0.7 K/uL (ref 0.1–1.0)
Monocytes Relative: 12 %
Neutro Abs: 3.8 K/uL (ref 1.7–7.7)
Neutrophils Relative %: 69 %
Platelets: 80 K/uL — ABNORMAL LOW (ref 150–400)
RBC: 2.54 MIL/uL — ABNORMAL LOW (ref 4.22–5.81)
RDW: 15.8 % — ABNORMAL HIGH (ref 11.5–15.5)
WBC: 5.5 K/uL (ref 4.0–10.5)
nRBC: 0 % (ref 0.0–0.2)

## 2024-04-17 LAB — PREPARE FRESH FROZEN PLASMA: Unit division: 0

## 2024-04-17 LAB — BPAM FFP
Blood Product Expiration Date: 202512292359
ISSUE DATE / TIME: 202512271320
Unit Type and Rh: 7300

## 2024-04-17 LAB — MAGNESIUM: Magnesium: 1.8 mg/dL (ref 1.7–2.4)

## 2024-04-17 LAB — APTT: aPTT: 33 s (ref 24–36)

## 2024-04-17 LAB — LACTATE DEHYDROGENASE: LDH: 300 U/L — ABNORMAL HIGH (ref 105–235)

## 2024-04-17 LAB — PROTIME-INR
INR: 1.1 (ref 0.8–1.2)
Prothrombin Time: 14.6 s (ref 11.4–15.2)

## 2024-04-17 LAB — PHOSPHORUS: Phosphorus: 2.6 mg/dL (ref 2.5–4.6)

## 2024-04-17 MED ORDER — HYDROMORPHONE HCL 1 MG/ML IJ SOLN: 0.5000 mg | INTRAMUSCULAR | Status: DC | PRN

## 2024-04-17 MED ORDER — HYDROCODONE-ACETAMINOPHEN 5-325 MG PO TABS
1.0000 | ORAL_TABLET | Freq: Four times a day (QID) | ORAL | Status: DC | PRN
  Administered 2024-04-17 – 2024-04-22 (×15): 1 via ORAL
  Filled 2024-04-17 (×3): qty 1

## 2024-04-17 MED ORDER — FENTANYL CITRATE (PF) 50 MCG/ML IJ SOSY
25.0000 ug | PREFILLED_SYRINGE | INTRAMUSCULAR | Status: AC | PRN
  Administered 2024-04-17 – 2024-04-18 (×2): 50 ug via INTRAVENOUS
  Filled 2024-04-17: qty 1

## 2024-04-17 NOTE — Progress Notes (Signed)
 Hematology/Oncology Progress Note  Clinical Summary: [name] is a 73 year old male s/p ORIF of left femur who is having increased post operative bleeding.   Interval History: --patient Hgb 7.9 today, increased from 7.2 yesterday after 2 units of PRBC -- wound vac cannister replaced today, noted to have 100 cc -- Plt stable at 80 with WBC 5.5 -- patient reports no overt bleeding such as gum bleeding, nosebleeds, blood in urine/stool --nurse reports no increase in drain output over shift, confirmed a consistent 100 cc.  --patient denies lightheadedness/dizziness or shortness of breath.    O:  Vitals:   04/17/24 1215 04/17/24 1634  BP: (!) 161/55 (!) 171/55  Pulse: (!) 53 (!) 53  Resp: 15 12  Temp: 97.8 F (36.6 C) (!) 97.4 F (36.3 C)  SpO2: 99% 98%      Latest Ref Rng & Units 04/17/2024    5:34 AM 04/16/2024    6:07 AM 04/15/2024    5:40 AM  CMP  Glucose 70 - 99 mg/dL 789  821  841   BUN 8 - 23 mg/dL 64  65  52   Creatinine 0.61 - 1.24 mg/dL 8.46  8.22  8.27   Sodium 135 - 145 mmol/L 134  134  135   Potassium 3.5 - 5.1 mmol/L 4.1  4.3  4.5   Chloride 98 - 111 mmol/L 101  101  102   CO2 22 - 32 mmol/L 24  24  24    Calcium  8.9 - 10.3 mg/dL 8.1  8.2  8.1       Latest Ref Rng & Units 04/17/2024    5:34 AM 04/16/2024    6:07 AM 04/15/2024    5:40 AM  CBC  WBC 4.0 - 10.5 K/uL 5.5  6.4  7.8   Hemoglobin 13.0 - 17.0 g/dL 7.9  7.2  7.5   Hematocrit 39.0 - 52.0 % 22.4  20.5  21.7   Platelets 150 - 400 K/uL 80  75  68       GENERAL: well appearing elderly Caucasian male in NAD  SKIN: skin color, texture, turgor are normal, no rashes or significant lesions EYES: conjunctiva are pink and non-injected, sclera clear LUNGS: clear to auscultation and percussion with normal breathing effort HEART: regular rate & rhythm and no murmurs and no lower extremity edema Musculoskeletal: no cyanosis of digits and no clubbing  PSYCH: alert & oriented x 3, fluent speech NEURO: no  focal motor/sensory deficits  Assessment/Plan:  73 y.o. male  with medical history significant for CVA on Plavix , anemia of chronic disease and iron  deficiency, chronic thrombocytopenia, CKD stage IIIa, HTN, HLD, T2DM, GAD, s/p left elbow ORIF 02/08/2024 and left THA 02/10/2024 who presented to the ED on 04/11/2024 for evaluation of left hip pain after fall at home.  # Persistent Post Operative Bleeding -- will order PT/INR, PTT, and DIC panel today.  Additionally will order PFA --TXA treatment recommend per surgical team -- continue to monitor wound vac ouput --appreciate recommendations per surgical team.    # Thrombocytopenia secondary to splenomegaly Chronic thrombocytopenia due to hypersplenism, with splenic sequestration of platelets. Platelet count remains above 50,000. Extensive prior evaluation, including bone marrow biopsies, excluded alternative etiologies. Frequent blood product transfusions increase risk of alloimmunization and future transfusion reactions, complicating transfusion support. - Set platelet transfusion threshold at <50,000/?L unless active bleeding. - Please avoid unnecessary platelet transfusions to minimize alloimmunization and transfusion reaction risk. - Monitor platelet counts and assess for bleeding.   #  Chronic anemia Chronic anemia with hemoglobin maintained above 7 g/dL, presumed secondary to splenic sequestration. Extensive evaluation previously has not identified an alternative cause. Multiple prior red blood cell transfusions increase risk of transfusion reactions and alloimmunization. - Please set red blood cell transfusion threshold at hemoglobin <7 g/dL unless symptomatic, at which time, goal would be to maintain hemoglobin above 8.   Norleen IVAR Kidney, MD Department of Hematology/Oncology [facility] at [facility] Phone: [phone] Pager: [phone] Email: [email or web address]

## 2024-04-17 NOTE — Progress Notes (Addendum)
 " PROGRESS NOTE Dennis Wise    DOB: 1950/05/19, 73 y.o.  FMW:[id number]    Code Status: Full Code   DOA: 04/11/2024   LOS: 6  Brief hospital course  Dennis Wise is a 73 y.o. male with a PMH significant for CVA on aspirin /Plavix , anemia of chronic disease and iron  deficiency, chronic thrombocytopenia, CKD stage IIIa, HTN, HLD, T2DM, GAD, s/p left elbow ORIF 02/08/2024 and left THA 02/10/2024 who is admitted for left periprosthetic hip fracture. Ortho brought back to OR for fixation 12/23 and is recovering as expected with wound vac in place. Ortho surgery has been following for this PT/OT are working with him and recommending SNF.  This admission is complicated by his anemia and thrombocytopenia with splenomegaly.  Hematology has been following for this.   Assessment & Plan   Acute left periprosthetic hip fracture: s/p ORIF left femur 12/23  - ortho following, no weightbearing in left leg for 8 weeks, ongoing postop blood loss, he still has some blood in the wound VAC, have requested Ortho to readdress on 04/17/2024, SCDs for DVT prophylaxis for ongoing bleeding.  Continue PT OT.  Will require SNF.  Left olecranon fracture- s/p fixation - OT, PT - keep elevated - analgesia PRN   Acute on chronic anemia-  S/p 3 platelete, 1 ffp, and 12 units of packed RBC this admission, last 2 units on 04/16/2024, thrombocytopenia presumed secondary to splenic sequestration, seen by hematology, per orthopedics wound VAC site stable, H&H again dropping, will check CT abdomen pelvis, peripheral smear, LDH and haptoglobin, requested hematology to follow-up on 04/17/2024.  Thrombocytopenia secondary to splenomegaly - Set platelet transfusion threshold at <50,000/?L unless active bleeding. - Please avoid unnecessary platelet transfusions to minimize alloimmunization and transfusion reaction risk. - Monitor   CKD stage IIIa Mild hyperkalemia: resolved Renal function relatively stable.  Continue to monitor.     History of CVA: Hold Plavix  for now, still has significant blood in wound VAC   Hypertension: BP well-controlled.  Continue losartan  and HCTZ.   Hyperlipidemia: Continue atorvastatin .   Mild vascular neurocognitive disorder GAD: Stable, continue Lexapro  and Namenda .keep on  delirium precautions.   Class I Obesity w/ Body mass index is 31.19 kg/m.: Will benefit with PCP follow-up, weight loss,healthy lifestyle and outpatient eval  Type 2 diabetes: Poorly controlled hyperglycemia.PTA on glipizide metformin  Actos , holding p.o. meds.On SSI 0 to 15 units, add Semglee  10 units and novolog  2 u premeal   Lab Results  Component Value Date   HGBA1C 5.6 03/24/2024   CBG (last 3)  Recent Labs    04/16/24 1227 04/16/24 1614 04/17/24 0659  GLUCAP 143* 164* 200*     Body mass index is 31.19 kg/m.  VTE ppx: SCDs Start: 04/12/24 2251  Diet:     Diet   Diet Carb Modified Room service appropriate? Yes   Consultants: Ortho surgery  Heme/onc  Subjective 04/17/2024    Patient in bed, appears comfortable, denies any headache, no fever, no chest pain or pressure, no shortness of breath , no abdominal pain. No new focal weakness.  Objective   Blood pressure (!) 159/53, pulse 70, temperature 98.6 F (37 C), temperature source Oral, resp. rate 11, height 6' 2 (1.88 m), weight 110.2 kg, SpO2 99%.  Intake/Output Summary (Last 24 hours) at 04/17/2024 0759 Last data filed at 04/17/2024 0441 Gross per 24 hour  Intake 693 ml  Output 2450 ml  Net -1757 ml   Filed Weights   04/11/24 2034 04/12/24 1143  Weight: 122.5 kg 110.2 kg    Physical Exam:   Awake Alert, No new F.N deficits, Normal affect .AT,PERRAL Supple Neck, No JVD,   Symmetrical Chest wall movement, Good air movement bilaterally, CTAB RRR,No Gallops, Rubs or new Murmurs,  +ve B.Sounds, Abd Soft, No tenderness,   Left hip postop site appears stable, wound VAC has some bloody discharge   Inpatient  Medications  Scheduled Meds:  vitamin C   1,000 mg Oral Daily   atorvastatin   10 mg Oral QHS   cholecalciferol   2,000 Units Oral BID   escitalopram   10 mg Oral Daily   hydrochlorothiazide   12.5 mg Oral Daily   insulin  aspart  0-15 Units Subcutaneous TID WC   insulin  aspart  2 Units Subcutaneous TID WC   insulin  glargine  10 Units Subcutaneous Daily   losartan   100 mg Oral Daily   memantine   5 mg Oral BID   multivitamin with minerals  1 tablet Oral Daily   nutrition supplement (JUVEN)  1 packet Oral BID BM   polyethylene glycol  17 g Oral BID   Ensure Max Protein  11 oz Oral BID BM   tamsulosin   0.4 mg Oral QHS   Continuous Infusions: PRN Meds:.acetaminophen , HYDROcodone -acetaminophen , HYDROmorphone  (DILAUDID ) injection, ondansetron  (ZOFRAN ) IV  DVT Prophylaxis  SCDs Start: 04/12/24 2251   Recent Labs  Lab 04/11/24 2044 04/12/24 0344 04/13/24 1024 04/13/24 2205 04/14/24 0223 04/15/24 0540 04/16/24 0607 04/17/24 0534  WBC 5.4   < > 8.5  --  7.5 7.8 6.4 5.5  HGB 8.0*   < > 7.6* 8.3* 7.9* 7.5* 7.2* 7.9*  HCT 24.2*   < > 21.5* 23.1* 22.2* 21.7* 20.5* 22.4*  PLT 94*   < > 77*  --  65* 68* 75* 80*  MCV 91.0   < > 86.0  --  86.7 87.9 88.4 88.2  MCH 30.1   < > 30.4  --  30.9 30.4 31.0 31.1  MCHC 33.1   < > 35.3  --  35.6 34.6 35.1 35.3  RDW 18.4*   < > 16.4*  --  16.4* 16.4* 16.1* 15.8*  LYMPHSABS 0.7  --  0.8  --  0.8  --  0.8 0.7  MONOABS 0.4  --  1.1*  --  0.9  --  0.6 0.7  EOSABS 0.1  --  0.0  --  0.1  --  0.1 0.3  BASOSABS 0.0  --  0.0  --  0.0  --  0.0 0.0   < > = values in this interval not displayed.    Recent Labs  Lab 04/11/24 2044 04/12/24 0344 04/12/24 2309 04/14/24 0223 04/15/24 0540 04/16/24 0607 04/17/24 0534  NA 136   < > 138 136 135 134* 134*  K 4.9   < > 5.2* 4.3 4.5 4.3 4.1  CL 102   < > 105 102 102 101 101  CO2 25   < > 23 25 24 24 24   ANIONGAP 9   < > 10 9 9 9 9   GLUCOSE 197*   < > 250* 157* 158* 178* 210*  BUN 39*   < > 38* 49* 52* 65*  64*  CREATININE 1.40*   < > 1.58* 1.93* 1.72* 1.77* 1.53*  INR 1.0  --   --  1.1  --   --   --   MG  --   --   --   --   --  1.9 1.8  PHOS  --   --   --   --   --  3.2 2.6  CALCIUM  8.6*   < > 8.0* 7.9* 8.1* 8.2* 8.1*   < > = values in this interval not displayed.      Recent Labs  Lab 04/11/24 2044 04/12/24 0344 04/12/24 2309 04/14/24 0223 04/15/24 0540 04/16/24 0607 04/17/24 0534  INR 1.0  --   --  1.1  --   --   --   MG  --   --   --   --   --  1.9 1.8  CALCIUM  8.6*   < > 8.0* 7.9* 8.1* 8.2* 8.1*   < > = values in this interval not displayed.    --------------------------------------------------------------------------------------------------------------- Lab Results  Component Value Date   CHOL 165 02/19/2023   HDL 97.00 02/19/2023   LDLCALC 53 02/19/2023   LDLDIRECT 74.0 10/16/2015   TRIG 77.0 02/19/2023   CHOLHDL 2 02/19/2023    Lab Results  Component Value Date   HGBA1C 5.6 03/24/2024   No results for input(s): TSH, T4TOTAL, FREET4, T3FREE, THYROIDAB in the last 72 hours. No results for input(s): VITAMINB12, FOLATE, FERRITIN, TIBC, IRON , RETICCTPCT in the last 72 hours. ------------------------------------------------------------------------------------------------------------------ Cardiac Enzymes No results for input(s): CKMB, TROPONINI, MYOGLOBIN in the last 168 hours.  Invalid input(s): CK  Micro Results No results found for this or any previous visit (from the past 240 hours).  Radiology Reports  No results found.    Signature  -   Lavada Stank M.D on 04/17/2024 at 7:59 AM   -  To page go to [email or web address]       "

## 2024-04-17 NOTE — Progress Notes (Signed)
 Orthopaedic Progress Note  S: Patient is resting comfortably in bed.  He has no specific complaints  O:  Vitals:   04/17/24 0400 04/17/24 0846  BP: (!) 165/64 (!) 162/48  Pulse: 61 (!) 52  Resp: 10 10  Temp: 98 F (36.7 C) 97.6 F (36.4 C)  SpO2: 97% 100%    Resting company in bed.  VAC dressing still in place on the left thigh.  Thigh is swollen but soft and compressible.  No calf tenderness.  5/5 strength EHL, FHL, gastrocs, GS anterior intact station saphenous, sural, tibial, peroneal nerve distributions   Labs:  Results for orders placed or performed during the hospital encounter of 04/11/24 (from the past 24 hours)  Type and screen     Status: None (Preliminary result)   Collection Time: 04/16/24  9:23 AM  Result Value Ref Range   ABO/RH(D) B POS    Antibody Screen NEG    Sample Expiration 04/19/2024,2359    Unit Number [id number]    Blood Component Type RED CELLS,LR    Unit division 00    Status of Unit ALLOCATED    Transfusion Status OK TO TRANSFUSE    Crossmatch Result Compatible    Unit Number [id number]    Blood Component Type RED CELLS,LR    Unit division 00    Status of Unit ISSUED,FINAL    Transfusion Status OK TO TRANSFUSE    Crossmatch Result      Compatible Performed at [facility], [address], [place], [place] [address]   Prepare RBC (crossmatch)     Status: None   Collection Time: 04/16/24  9:31 AM  Result Value Ref Range   Order Confirmation      ORDER PROCESSED BY BLOOD BANK Performed at [facility], [address] [place], [place] [address]   Glucose, capillary     Status: Abnormal   Collection Time: 04/16/24 12:27 PM  Result Value Ref Range   Glucose-Capillary 143 (H) 70 - 99 mg/dL  Glucose, capillary     Status: Abnormal   Collection Time: 04/16/24  4:14 PM  Result Value Ref Range   Glucose-Capillary 164 (H) 70 - 99 mg/dL  Magnesium     Status: None   Collection Time: 04/17/24  5:34 AM  Result Value Ref Range    Magnesium 1.8 1.7 - 2.4 mg/dL  CBC with Differential/Platelet     Status: Abnormal   Collection Time: 04/17/24  5:34 AM  Result Value Ref Range   WBC 5.5 4.0 - 10.5 K/uL   RBC 2.54 (L) 4.22 - 5.81 MIL/uL   Hemoglobin 7.9 (L) 13.0 - 17.0 g/dL   HCT 77.5 (L) 60.9 - 47.9 %   MCV 88.2 80.0 - 100.0 fL   MCH 31.1 26.0 - 34.0 pg   MCHC 35.3 30.0 - 36.0 g/dL   RDW 84.1 (H) 88.4 - 84.4 %   Platelets 80 (L) 150 - 400 K/uL   nRBC 0.0 0.0 - 0.2 %   Neutrophils Relative % 69 %   Neutro Abs 3.8 1.7 - 7.7 K/uL   Lymphocytes Relative 13 %   Lymphs Abs 0.7 0.7 - 4.0 K/uL   Monocytes Relative 12 %   Monocytes Absolute 0.7 0.1 - 1.0 K/uL   Eosinophils Relative 5 %   Eosinophils Absolute 0.3 0.0 - 0.5 K/uL   Basophils Relative 0 %   Basophils Absolute 0.0 0.0 - 0.1 K/uL   Immature Granulocytes 1 %   Abs  Immature Granulocytes 0.06 0.00 - 0.07 K/uL  Basic metabolic panel with GFR     Status: Abnormal   Collection Time: 04/17/24  5:34 AM  Result Value Ref Range   Sodium 134 (L) 135 - 145 mmol/L   Potassium 4.1 3.5 - 5.1 mmol/L   Chloride 101 98 - 111 mmol/L   CO2 24 22 - 32 mmol/L   Glucose, Bld 210 (H) 70 - 99 mg/dL   BUN 64 (H) 8 - 23 mg/dL   Creatinine, Ser 8.46 (H) 0.61 - 1.24 mg/dL   Calcium  8.1 (L) 8.9 - 10.3 mg/dL   GFR, Estimated 47 (L) >60 mL/min   Anion gap 9 5 - 15  Phosphorus     Status: None   Collection Time: 04/17/24  5:34 AM  Result Value Ref Range   Phosphorus 2.6 2.5 - 4.6 mg/dL  Glucose, capillary     Status: Abnormal   Collection Time: 04/17/24  6:59 AM  Result Value Ref Range   Glucose-Capillary 200 (H) 70 - 99 mg/dL  Glucose, capillary     Status: Abnormal   Collection Time: 04/17/24  8:45 AM  Result Value Ref Range   Glucose-Capillary 199 (H) 70 - 99 mg/dL    Assessment: Status post ORIF left periprosthetic femur fracture  The patient remains essentially stable.  He did require another transfusion yesterday but his hemoglobin has bumped appropriately.   Platelets are still low at 80.  He continue work with physical therapy.  Again he is nonweightbearing on the left side.  He can look for rehab placement.  Will continue the incisional wound VAC.  Will change this to a dressing when he is discharged.  We can consider restarting his Plavix  once his H&H is more stable.  Follow-up with Dr. Celena in 2 weeks after discharge     Cordella Rhein, MD, MS [facility]oped[facility] Specialist / [place] [phone]

## 2024-04-17 NOTE — Plan of Care (Signed)
" °  Problem: Coping: Goal: Ability to adjust to condition or change in health will improve Outcome: Progressing   Problem: Fluid Volume: Goal: Ability to maintain a balanced intake and output will improve Outcome: Progressing   Problem: Health Behavior/Discharge Planning: Goal: Ability to manage health-related needs will improve Outcome: Progressing   Problem: Skin Integrity: Goal: Risk for impaired skin integrity will decrease Outcome: Progressing   Problem: Tissue Perfusion: Goal: Adequacy of tissue perfusion will improve Outcome: Progressing   Problem: Clinical Measurements: Goal: Will remain free from infection Outcome: Progressing Goal: Diagnostic test results will improve Outcome: Progressing   Problem: Activity: Goal: Risk for activity intolerance will decrease Outcome: Progressing   Problem: Coping: Goal: Level of anxiety will decrease Outcome: Progressing   Problem: Pain Managment: Goal: General experience of comfort will improve and/or be controlled Outcome: Progressing   "

## 2024-04-17 NOTE — Plan of Care (Signed)
" °  Problem: Metabolic: Goal: Ability to maintain appropriate glucose levels will improve Outcome: Progressing   Problem: Nutritional: Goal: Maintenance of adequate nutrition will improve Outcome: Progressing Goal: Progress toward achieving an optimal weight will improve Outcome: Progressing   Problem: Skin Integrity: Goal: Risk for impaired skin integrity will decrease Outcome: Progressing   Problem: Tissue Perfusion: Goal: Adequacy of tissue perfusion will improve Outcome: Progressing   Problem: Education: Goal: Knowledge of General Education information will improve Description: Including pain rating scale, medication(s)/side effects and non-pharmacologic comfort measures Outcome: Progressing   "

## 2024-04-18 DIAGNOSIS — M978XXA Periprosthetic fracture around other internal prosthetic joint, initial encounter: Secondary | ICD-10-CM | POA: Diagnosis not present

## 2024-04-18 DIAGNOSIS — Z96649 Presence of unspecified artificial hip joint: Secondary | ICD-10-CM | POA: Diagnosis not present

## 2024-04-18 LAB — CBC WITH DIFFERENTIAL/PLATELET
Abs Immature Granulocytes: 0.06 K/uL (ref 0.00–0.07)
Basophils Absolute: 0 K/uL (ref 0.0–0.1)
Basophils Relative: 0 %
Eosinophils Absolute: 0.4 K/uL (ref 0.0–0.5)
Eosinophils Relative: 7 %
HCT: 26.4 % — ABNORMAL LOW (ref 39.0–52.0)
Hemoglobin: 9.2 g/dL — ABNORMAL LOW (ref 13.0–17.0)
Immature Granulocytes: 1 %
Lymphocytes Relative: 17 %
Lymphs Abs: 0.9 K/uL (ref 0.7–4.0)
MCH: 30.9 pg (ref 26.0–34.0)
MCHC: 34.8 g/dL (ref 30.0–36.0)
MCV: 88.6 fL (ref 80.0–100.0)
Monocytes Absolute: 0.7 K/uL (ref 0.1–1.0)
Monocytes Relative: 13 %
Neutro Abs: 3.1 K/uL (ref 1.7–7.7)
Neutrophils Relative %: 62 %
Platelets: 96 K/uL — ABNORMAL LOW (ref 150–400)
RBC: 2.98 MIL/uL — ABNORMAL LOW (ref 4.22–5.81)
RDW: 15.2 % (ref 11.5–15.5)
WBC: 5.1 K/uL (ref 4.0–10.5)
nRBC: 0 % (ref 0.0–0.2)

## 2024-04-18 LAB — TECHNOLOGIST SMEAR REVIEW: Plt Morphology: NORMAL

## 2024-04-18 LAB — BASIC METABOLIC PANEL WITH GFR
Anion gap: 9 (ref 5–15)
BUN: 62 mg/dL — ABNORMAL HIGH (ref 8–23)
CO2: 25 mmol/L (ref 22–32)
Calcium: 8.9 mg/dL (ref 8.9–10.3)
Chloride: 102 mmol/L (ref 98–111)
Creatinine, Ser: 1.35 mg/dL — ABNORMAL HIGH (ref 0.61–1.24)
GFR, Estimated: 55 mL/min — ABNORMAL LOW
Glucose, Bld: 127 mg/dL — ABNORMAL HIGH (ref 70–99)
Potassium: 4.1 mmol/L (ref 3.5–5.1)
Sodium: 136 mmol/L (ref 135–145)

## 2024-04-18 LAB — GLUCOSE, CAPILLARY
Glucose-Capillary: 147 mg/dL — ABNORMAL HIGH (ref 70–99)
Glucose-Capillary: 134 mg/dL — ABNORMAL HIGH (ref 70–99)
Glucose-Capillary: 140 mg/dL — ABNORMAL HIGH (ref 70–99)

## 2024-04-18 LAB — PHOSPHORUS: Phosphorus: 2.5 mg/dL (ref 2.5–4.6)

## 2024-04-18 LAB — HAPTOGLOBIN: Haptoglobin: 239 mg/dL (ref 34–355)

## 2024-04-18 LAB — MAGNESIUM: Magnesium: 1.8 mg/dL (ref 1.7–2.4)

## 2024-04-18 MED ORDER — AMLODIPINE BESYLATE 10 MG PO TABS
10.0000 mg | ORAL_TABLET | Freq: Every day | ORAL | Status: DC
  Administered 2024-04-18 – 2024-04-23 (×6): 10 mg via ORAL
  Filled 2024-04-18 (×6): qty 1

## 2024-04-18 MED ORDER — ENSURE MAX PROTEIN PO LIQD: 11.0000 [oz_av] | Freq: Two times a day (BID) | ORAL | Status: AC

## 2024-04-18 MED ORDER — HYDROCODONE-ACETAMINOPHEN 5-325 MG PO TABS: 1.0000 | ORAL_TABLET | Freq: Four times a day (QID) | ORAL | 0 refills | Status: DC | PRN

## 2024-04-18 MED ORDER — VITAMIN D3 125 MCG (5000 UT) PO CAPS: 1.0000 | ORAL_CAPSULE | Freq: Every day | ORAL | Status: AC

## 2024-04-18 MED ORDER — ADULT MULTIVITAMIN W/MINERALS CH: 1.0000 | ORAL_TABLET | Freq: Every day | ORAL | Status: AC

## 2024-04-18 MED ORDER — ASCORBIC ACID 1000 MG PO TABS: 1000.0000 mg | ORAL_TABLET | Freq: Every day | ORAL | Status: DC

## 2024-04-18 NOTE — Plan of Care (Signed)
" °  Problem: Nutritional: Goal: Maintenance of adequate nutrition will improve Outcome: Progressing   Problem: Skin Integrity: Goal: Risk for impaired skin integrity will decrease Outcome: Progressing   Problem: Education: Goal: Knowledge of General Education information will improve Description: Including pain rating scale, medication(s)/side effects and non-pharmacologic comfort measures Outcome: Progressing   Problem: Clinical Measurements: Goal: Diagnostic test results will improve Outcome: Progressing Goal: Respiratory complications will improve Outcome: Progressing   "

## 2024-04-18 NOTE — Discharge Instructions (Addendum)
 "  Orthopaedic Trauma Service Discharge Instructions   General Discharge Instructions  Orthopaedic Injuries:  Left periprosthetic proximal femur fracture treated with open reduction internal fixation using plate and screws  WEIGHT BEARING STATUS: Nonweightbearing left leg for 8 weeks from the date of surgery  RANGE OF MOTION/ACTIVITY: Posterior hip precautions left hip due to total hip replacement, no motion restrictions left knee.  Activity as tolerated while maintaining weightbearing restrictions noted above  Bone health: Labs did show vitamin D  deficiency.  Please supplement with vitamin D3 5000 IUs daily.  Optimize nutrition, dietary consult  Review the following resource for additional information regarding bone health  [email or web address]  Wound Care: Dressing changes every other day starting upon arrival to nursing facility.  Can use Mepilex (silicone foam dressing) or 4 x 4 gauze and tape.  Would also continue with TED hose to help with swelling.   Discharge Wound Care Instructions  Do NOT apply any ointments, solutions or lotions to pin sites or surgical wounds.  These prevent needed drainage and even though solutions like hydrogen peroxide kill bacteria, they also damage cells lining the pin sites that help fight infection.  Applying lotions or ointments can keep the wounds moist and can cause them to breakdown and open up as well. This can increase the risk for infection. When in doubt call the office.  Surgical incisions should be dressed daily.  If any drainage is noted, you can use a silicone foam dressing such as a Mepilex or 4 x 4 gauze and tape.  Continue with TED hose to help with swelling  control  [email or web address]  [email or web address]  These dressing supplies should be available at local medical supply stores ([employer], [facility], etc). They are not usually carried at places like [facility], [facility], [employer], etc  Once the incision is completely dry and without drainage, it may be left open to air out.  Showering may begin 36-48 hours later.  Cleaning gently with soap and water .   Diet: as you were eating previously.  Can use over the counter stool softeners and bowel preparations, such as Miralax , to help with bowel movements.  Narcotics can be constipating.  Be sure to drink plenty of fluids  PAIN MEDICATION USE AND EXPECTATIONS  You have likely been given narcotic medications to help control your pain.  After a traumatic event that results in an fracture (broken bone) with or without surgery, it is ok to use narcotic pain medications to help control one's pain.  We understand that everyone responds to pain differently and each individual patient will be evaluated on a regular basis for the continued need for narcotic medications. Ideally, narcotic medication use should last no more than 6-8 weeks (coinciding with fracture healing).   As a patient it is your responsibility as well to monitor narcotic medication use and report the amount and frequency you use these medications when you come to your office visit.   We would also advise that if you are using narcotic medications, you should take a dose prior to therapy to maximize you participation.  IF YOU ARE ON NARCOTIC MEDICATIONS IT IS NOT PERMISSIBLE TO OPERATE A MOTOR VEHICLE (MOTORCYCLE/CAR/TRUCK/MOPED) OR HEAVY MACHINERY DO NOT MIX NARCOTICS WITH OTHER CNS (CENTRAL NERVOUS SYSTEM)  DEPRESSANTS SUCH AS ALCOHOL    POST-OPERATIVE OPIOID TAPER INSTRUCTIONS: It is important to wean off of your opioid medication as soon as possible. If you do not need pain medication after your surgery it is ok to stop day one. Opioids  include: Codeine, Hydrocodone (Norco, Vicodin), Oxycodone (Percocet, oxycontin ) and hydromorphone  amongst others.  Long term and even short term use of opiods can cause: Increased pain response Dependence Constipation Depression Respiratory depression And more.  Withdrawal symptoms can include Flu like symptoms Nausea, vomiting And more Techniques to manage these symptoms Hydrate well Eat regular healthy meals Stay active Use relaxation techniques(deep breathing, meditating, yoga) Do Not substitute Alcohol  to help with tapering If you have been on opioids for less than two weeks and do not have pain than it is ok to stop all together.  Plan to wean off of opioids This plan should start within one week post op of your fracture surgery  Maintain the same interval or time between taking each dose and first decrease the dose.  Cut the total daily intake of opioids by one tablet each day Next start to increase the time between doses. The last dose that should be eliminated is the evening dose.    STOP SMOKING OR USING NICOTINE PRODUCTS!!!!  As discussed nicotine severely impairs your body's ability to heal surgical and traumatic wounds but also impairs bone healing.  Wounds and bone heal by forming microscopic blood vessels (angiogenesis) and nicotine is a vasoconstrictor (essentially, shrinks blood vessels).  Therefore, if vasoconstriction occurs to these microscopic blood vessels they essentially disappear and are unable to deliver necessary nutrients to the healing tissue.  This is one modifiable factor that you can do to dramatically increase your chances of healing your injury.    (This means no smoking, no nicotine gum, patches, etc)  DO NOT USE  NONSTEROIDAL ANTI-INFLAMMATORY DRUGS (NSAID'S)  Using products such as Advil (ibuprofen), Aleve (naproxen), Motrin (ibuprofen) for additional pain control during fracture healing can delay and/or prevent the healing response.  If you would like to take over the counter (OTC) medication, Tylenol  (acetaminophen ) is ok.  However, some narcotic medications that are given for pain control contain acetaminophen  as well. Therefore, you should not exceed more than 4000 mg of tylenol  in a day if you do not have liver disease.  Also note that there are may OTC medicines, such as cold medicines and allergy medicines that my contain tylenol  as well.  If you have any questions about medications and/or interactions please ask your doctor/PA or your pharmacist.      ICE AND ELEVATE INJURED/OPERATIVE EXTREMITY  Using ice and elevating the injured extremity above your heart can help with swelling and pain control.  Icing in a pulsatile fashion, such as 20 minutes on and 20 minutes off, can be followed.    Do not place ice directly on skin. Make sure there is a barrier between to skin and the ice pack.    Using frozen items such as frozen peas works well as the conform nicely to the are that needs to be iced.  USE AN ACE WRAP OR TED HOSE FOR SWELLING CONTROL  In addition to icing and elevation, Ace wraps or TED hose are used to help limit and resolve swelling.  It is recommended to use Ace wraps or TED hose until you are informed to stop.    When using Ace Wraps start the wrapping distally (farthest away from the body) and wrap proximally (closer to the body)   Example: If you had surgery on your leg and you do not have a splint on, start the ace wrap at the toes and work your way up to the thigh        If you had surgery  on your upper extremity and do not have a splint on, start the ace wrap at your fingers and work your way up to the upper arm  IF YOU ARE IN A SPLINT OR CAST DO NOT REMOVE IT FOR ANY REASON   If your  splint gets wet for any reason please contact the office immediately. You may shower in your splint or cast as long as you keep it dry.  This can be done by wrapping in a cast cover or garbage back (or similar)  Do Not stick any thing down your splint or cast such as pencils, money, or hangers to try and scratch yourself with.  If you feel itchy take benadryl  as prescribed on the bottle for itching  IF YOU ARE IN A CAM BOOT (BLACK BOOT)  You may remove boot periodically. Perform daily dressing changes as noted below.  Wash the liner of the boot regularly and wear a sock when wearing the boot. It is recommended that you sleep in the boot until told otherwise    Call office for the following: Temperature greater than 101F Persistent nausea and vomiting Severe uncontrolled pain Redness, tenderness, or signs of infection (pain, swelling, redness, odor or green/yellow discharge around the site) Difficulty breathing, headache or visual disturbances Hives Persistent dizziness or light-headedness Extreme fatigue Any other questions or concerns you may have after discharge  In an emergency, call 911 or go to an Emergency Department at a nearby hospital  HELPFUL INFORMATION  If you had a block, it will wear off between 8-24 hrs postop typically.  This is period when your pain may go from nearly zero to the pain you would have had postop without the block.  This is an abrupt transition but nothing dangerous is happening.  You may take an extra dose of narcotic when this happens.  You should wean off your narcotic medicines as soon as you are able.  Most patients will be off or using minimal narcotics before their first postop appointment.   We suggest you use the pain medication the first night prior to going to bed, in order to ease any pain when the anesthesia wears off. You should avoid taking pain medications on an empty stomach as it will make you nauseous.  Do not drink alcoholic beverages or  take illicit drugs when taking pain medications.  In most states it is against the law to drive while you are in a splint or sling.  And certainly against the law to drive while taking narcotics.  You may return to work/school in the next couple of days when you feel up to it.   Pain medication may make you constipated.  Below are a few solutions to try in this order: Decrease the amount of pain medication if you arent having pain. Drink lots of decaffeinated fluids. Drink prune juice and/or each dried prunes  If the first 3 dont work start with additional solutions Take Colace - an over-the-counter stool softener Take Senokot - an over-the-counter laxative Take Miralax  - a stronger over-the-counter laxative     CALL THE OFFICE WITH ANY QUESTIONS OR CONCERNS: [phone]   VISIT OUR WEBSITE FOR ADDITIONAL INFORMATION: [email or web address]       Follow with Primary MD [name], MD in 7 days   Get CBC, CMP, Magnesium, 2 view Chest X ray -  checked next visit with your primary MD or SNF MD   Activity: Nonweightbearing left lower leg, left posterior hip precautions, use full  fall precautions use walker/cane & assistance as needed  Disposition SNF  Diet: Heart Healthy Carb, check CBGs QAC-HS  Special Instructions: If you have smoked or chewed Tobacco  in the last 2 yrs please stop smoking, stop any regular Alcohol   and or any Recreational drug use.  On your next visit with your primary care physician please Get Medicines reviewed and adjusted.  Please request your Prim.MD to go over all Hospital Tests and Procedure/Radiological results at the follow up, please get all Hospital records sent to your Prim MD by signing hospital release before you go home.  If you experience worsening of your admission symptoms, develop shortness of breath, life threatening emergency, suicidal or homicidal thoughts you must seek medical attention immediately by calling 911 or calling your MD  immediately  if symptoms less severe.  You Must read complete instructions/literature along with all the possible adverse reactions/side effects for all the Medicines you take and that have been prescribed to you. Take any new Medicines after you have completely understood and accpet all the possible adverse reactions/side effects.   Do not drive when taking Pain medications.  Do not take more than prescribed Pain, Sleep and Anxiety Medications  Wear Seat belts while driving.     "

## 2024-04-18 NOTE — Plan of Care (Signed)
  Problem: Education: Goal: Ability to describe self-care measures that may prevent or decrease complications (Diabetes Survival Skills Education) will improve Outcome: Progressing Goal: Individualized Educational Video(s) Outcome: Progressing   Problem: Coping: Goal: Ability to adjust to condition or change in health will improve Outcome: Progressing   Problem: Fluid Volume: Goal: Ability to maintain a balanced intake and output will improve Outcome: Progressing   Problem: Health Behavior/Discharge Planning: Goal: Ability to identify and utilize available resources and services will improve Outcome: Progressing Goal: Ability to manage health-related needs will improve Outcome: Progressing   Problem: Metabolic: Goal: Ability to maintain appropriate glucose levels will improve Outcome: Progressing   Problem: Nutritional: Goal: Maintenance of adequate nutrition will improve Outcome: Progressing Goal: Progress toward achieving an optimal weight will improve Outcome: Progressing   Problem: Skin Integrity: Goal: Risk for impaired skin integrity will decrease Outcome: Progressing   Problem: Tissue Perfusion: Goal: Adequacy of tissue perfusion will improve Outcome: Progressing   Problem: Education: Goal: Knowledge of General Education information will improve Description: Including pain rating scale, medication(s)/side effects and non-pharmacologic comfort measures Outcome: Progressing   Problem: Health Behavior/Discharge Planning: Goal: Ability to manage health-related needs will improve Outcome: Progressing   Problem: Clinical Measurements: Goal: Ability to maintain clinical measurements within normal limits will improve Outcome: Progressing Goal: Will remain free from infection Outcome: Progressing Goal: Diagnostic test results will improve Outcome: Progressing Goal: Respiratory complications will improve Outcome: Progressing Goal: Cardiovascular complication will  be avoided Outcome: Progressing   Problem: Activity: Goal: Risk for activity intolerance will decrease Outcome: Progressing   Problem: Nutrition: Goal: Adequate nutrition will be maintained Outcome: Progressing   Problem: Coping: Goal: Level of anxiety will decrease Outcome: Progressing   Problem: Elimination: Goal: Will not experience complications related to bowel motility Outcome: Progressing Goal: Will not experience complications related to urinary retention Outcome: Progressing   Problem: Pain Managment: Goal: General experience of comfort will improve and/or be controlled Outcome: Progressing   Problem: Safety: Goal: Ability to remain free from injury will improve Outcome: Progressing   Problem: Skin Integrity: Goal: Risk for impaired skin integrity will decrease Outcome: Progressing   Problem: Education: Goal: Verbalization of understanding the information provided (i.e., activity precautions, restrictions, etc) will improve Outcome: Progressing Goal: Individualized Educational Video(s) Outcome: Progressing   Problem: Activity: Goal: Ability to ambulate and perform ADLs will improve Outcome: Progressing   Problem: Clinical Measurements: Goal: Postoperative complications will be avoided or minimized Outcome: Progressing   Problem: Self-Concept: Goal: Ability to maintain and perform role responsibilities to the fullest extent possible will improve Outcome: Progressing   Problem: Pain Management: Goal: Pain level will decrease Outcome: Progressing

## 2024-04-18 NOTE — Progress Notes (Signed)
 "                                                                         [facility] Progress Note  Patient ID: Dennis Wise MRN: [id number] DOB/AGE: 25-Dec-1950 73 y.o.  Subjective:  Looks better Sore but ok  Wife at bedside Denies any acute issues  No drainage in new vac canister   H/H appears to have stabilized   Minimal opioid usage    ROS As above  Today's  total administered Morphine  Milligram Equivalents: 15 Yesterday's total administered Morphine  Milligram Equivalents: 40  Objective:   VITALS:   Vitals:   04/17/24 2000 04/18/24 0000 04/18/24 0400 04/18/24 0800  BP: (!) 154/54 (!) 159/50  (!) 153/51  Pulse: (!) 52 (!) 52 (!) 55 (!) 52  Resp:  (!) 9 (!) 25 (!) 22  Temp: 98 F (36.7 C) 98 F (36.7 C) 98 F (36.7 C) 97.7 F (36.5 C)  TempSrc: Oral Oral Oral Oral  SpO2:    93%  Weight:      Height:        Estimated body mass index is 31.19 kg/m as calculated from the following:   Height as of this encounter: 6' 2 (1.88 m).   Weight as of this encounter: 110.2 kg.   Intake/Output      12/28 0701 12/29 0700 12/29 0701 12/30 0700   P.O. 240    Blood     Total Intake(mL/kg) 240 (2.2)    Urine (mL/kg/hr) 1950 (0.7)    Drains 0    Stool 0    Total Output 1950    Net -1710         Urine Occurrence 1 x    Stool Occurrence 1 x      LABS  Results for orders placed or performed during the hospital encounter of 04/11/24 (from the past 24 hours)  Glucose, capillary     Status: Abnormal   Collection Time: 04/17/24 12:13 PM  Result Value Ref Range   Glucose-Capillary 207 (H) 70 - 99 mg/dL  Protime-INR     Status: None   Collection Time: 04/17/24  1:01 PM  Result Value Ref Range   Prothrombin Time 14.6 11.4 - 15.2 seconds   INR 1.1 0.8 - 1.2  APTT     Status: None   Collection Time: 04/17/24  1:01 PM  Result Value Ref Range   aPTT 33 24 - 36 seconds  Lactate dehydrogenase     Status: Abnormal   Collection Time: 04/17/24  1:01 PM   Result Value Ref Range   LDH 300 (H) 105 - 235 U/L  Haptoglobin     Status: None   Collection Time: 04/17/24  1:01 PM  Result Value Ref Range   Haptoglobin 239 34 - 355 mg/dL  Glucose, capillary     Status: Abnormal   Collection Time: 04/17/24  4:33 PM  Result Value Ref Range   Glucose-Capillary 208 (H) 70 - 99 mg/dL  Magnesium     Status: None   Collection Time: 04/18/24  4:36 AM  Result Value Ref Range   Magnesium 1.8 1.7 - 2.4 mg/dL  CBC with Differential/Platelet     Status: Abnormal  Collection Time: 04/18/24  4:36 AM  Result Value Ref Range   WBC 5.1 4.0 - 10.5 K/uL   RBC 2.98 (L) 4.22 - 5.81 MIL/uL   Hemoglobin 9.2 (L) 13.0 - 17.0 g/dL   HCT 73.5 (L) 60.9 - 47.9 %   MCV 88.6 80.0 - 100.0 fL   MCH 30.9 26.0 - 34.0 pg   MCHC 34.8 30.0 - 36.0 g/dL   RDW 84.7 88.4 - 84.4 %   Platelets 96 (L) 150 - 400 K/uL   nRBC 0.0 0.0 - 0.2 %   Neutrophils Relative % 62 %   Neutro Abs 3.1 1.7 - 7.7 K/uL   Lymphocytes Relative 17 %   Lymphs Abs 0.9 0.7 - 4.0 K/uL   Monocytes Relative 13 %   Monocytes Absolute 0.7 0.1 - 1.0 K/uL   Eosinophils Relative 7 %   Eosinophils Absolute 0.4 0.0 - 0.5 K/uL   Basophils Relative 0 %   Basophils Absolute 0.0 0.0 - 0.1 K/uL   Immature Granulocytes 1 %   Abs Immature Granulocytes 0.06 0.00 - 0.07 K/uL  Basic metabolic panel with GFR     Status: Abnormal   Collection Time: 04/18/24  4:36 AM  Result Value Ref Range   Sodium 136 135 - 145 mmol/L   Potassium 4.1 3.5 - 5.1 mmol/L   Chloride 102 98 - 111 mmol/L   CO2 25 22 - 32 mmol/L   Glucose, Bld 127 (H) 70 - 99 mg/dL   BUN 62 (H) 8 - 23 mg/dL   Creatinine, Ser 8.64 (H) 0.61 - 1.24 mg/dL   Calcium  8.9 8.9 - 10.3 mg/dL   GFR, Estimated 55 (L) >60 mL/min   Anion gap 9 5 - 15  Phosphorus     Status: None   Collection Time: 04/18/24  4:36 AM  Result Value Ref Range   Phosphorus 2.5 2.5 - 4.6 mg/dL  Glucose, capillary     Status: Abnormal   Collection Time: 04/18/24  8:26 AM  Result Value  Ref Range   Glucose-Capillary 147 (H) 70 - 99 mg/dL     PHYSICAL EXAM:   Gen: sitting up in bed, NAD, wife at bedside  Lungs: unlabored Ext:       Left Lower extremity              Incisional vac in place, good seal                          No drainage in canister   Incisional vac dressing removed    Incision line looks great    No active drainage    Mild blistering due to vac drape at proximal thigh     New mepilex applied              Moderate swelling to left thigh but compressible and not overly tense              TED hose in place             Ext warm              + DP pulse             EHL, FHL, lesser toe motor intact             Ankle flex, ext, inv and evr intact             DPN, SPN, TN sensation intact  No DCT     Assessment/Plan: 6 Days Post-Op   Principal Problem:   Periprosthetic hip fracture, initial encounter Active Problems:   Essential hypertension   Thrombocytopenia   IDA (iron  deficiency anemia)   Generalized anxiety disorder   DM type 2 with diabetic background retinopathy (HCC)   Chronic kidney disease, stage 3a (HCC)   Dyslipidemia   History of stroke   Anti-infectives (From admission, onward)    Start     Dose/Rate Route Frequency Ordered Stop   04/13/24 0000  ceFAZolin  (ANCEF ) IVPB 2g/100 mL premix        2 g 200 mL/hr over 30 Minutes Intravenous Every 6 hours 04/12/24 2250 04/13/24 0540   04/12/24 0930  ceFAZolin  (ANCEF ) IVPB 2g/100 mL premix        2 g 200 mL/hr over 30 Minutes Intravenous On call to O.R. 04/12/24 9164 04/12/24 1814     .  POD/HD#: 63  73 y/o male s/p fall with Left periprosthetic proximal femur fracture    - fall    - L periprosthetic proximal femur fracture s/p ORIF              NWB L leg x 6-8 weeks, 7 more weeks to go             Total hip precautions, posterior              No knee ROM restrictions              Incisional VAC has been removed   Mepilex dressing applied.  Change dressing  every other day starting on 04/20/2024.  Can clean with soap and water  at that time as well.  Do not apply any additional ointments, lotions or solutions to his surgical site              Continue with TED hose              Ice and elevate              Therapy evals              TOC for SNF   - L olecranon fracture October 2025 s/p ORIF             Fracture is healed             No restrictions to L UEx      - Pain management:             Multimodal              Minimize narcotics             Delirium precautions    - ABL anemia/Hemodynamics             H/H appear to be stabilizing              Continue to monitor    - Medical issues              Per primary    - DVT/PE prophylaxis:             Continue to hold lovenox               SCDs and TED              Would be inclined to hold Plavix  until discharge if okay with medical team              - ID:  Periop abx completed    - Metabolic Bone Disease:             Vitamin d  deficiency                          Supplement    - Activity:             As above   - Impediments to fracture healing:             Vitamin d  deficiency              Multiple fragility fractures              DM             CKD             Multiple chronic medical issues    - Dispo:             Ortho issues are stable             Therapies              TOC for SNF   Follow-up with Ortho in 10 to 14 days  Please call Ortho with any questions or concerns   Sign prescription for hydrocodone  placed on patient's physical chart      Francis MICAEL Mt, PA-C [phone] (C) 04/18/2024, 9:26 AM  [facility] [address] [place] [address] [phone] (F)    After 5pm and on the weekends please log on to Amion, go to orthopaedics and the look under the [facility] Call for the provider(s) on call. You can also call our office at [phone] and then follow the prompts to be  connected to the call team.  Patient ID: Dennis Wise, male   DOB: 1951-01-26, 73 y.o.   MRN: [id number]  "

## 2024-04-18 NOTE — [place] FL2 (Signed)
 " [place]  MEDICAID FL2 LEVEL OF CARE FORM     IDENTIFICATION  Patient Name: Dennis Wise Birthdate: [birth year] Sex: male Admission Date (Current Location): 04/11/2024  [facility] and [place] Number:  Producer, Television/film/video and Address:  [facility], [address] [place], [place] [address]      Provider Number: [id number]  Attending Physician Name and Address:  Dennise Lavada POUR, MD  Relative Name and Phone Number:       Current Level of Care: Hospital Recommended Level of Care: Skilled Nursing Facility Prior Approval Number:    Date Approved/Denied:   PASRR Number: [id number]  Discharge Plan: SNF    Current Diagnoses: Patient Active Problem List   Diagnosis Date Noted   Periprosthetic hip fracture, initial encounter 04/11/2024   Sacral pressure ulcer 03/27/2024   History of hip fracture 03/27/2024   Fall at home, initial encounter 02/15/2024   Elbow fracture, left, closed, initial encounter 02/15/2024   Chronic kidney disease, stage 3a (HCC) 02/15/2024   History of fusion of cervical spine 02/15/2024   Dyslipidemia 02/15/2024   History of stroke 02/15/2024   DNR (do not resuscitate) 02/15/2024   Closed left hip fracture (HCC) 02/07/2024   Post-operative pain 01/21/2024   Diabetic skin ulcer associated with type 2 diabetes mellitus (HCC) 10/06/2023   Other social stressor 05/31/2023   DM type 2 with diabetic background retinopathy (HCC) 05/31/2023   Mild vascular neurocognitive disorder 05/07/2023   Generalized anxiety disorder 10/01/2022   Pain in joint of left knee 09/27/2022   Hemorrhagic disorder due to circulating anticoagulants 09/27/2022   Ischemic right PCA stroke 09/05/2022   Left homonymous hemianopsia 08/2022   Shoulder pain 07/02/2022   Monoclonal B-cell lymphocytosis 01/15/2022   Weight loss 01/15/2022   Syncope 11/22/2021   Bilateral carotid artery stenosis 09/23/2021   Nocturia 08/18/2021   IDA (iron  deficiency anemia)  07/24/2021   Thrombocytopenia 05/03/2021   Normocytic anemia 04/03/2021   Unstable ankle 09/12/2020   Back pain 09/12/2020   Dupuytren contracture 03/03/2019   Pseudophakia, both eyes 07/17/2016   Combined form of senile cataract of both eyes 05/02/2016   Hearing loss 10/20/2014   Obesity (BMI 30-39.9) 10/16/2013   Advance care planning 10/16/2013   Actinic keratosis 10/16/2013   History of colonic polyps 06/05/2012   Pancytopenia, acquired 05/01/2011   Tibial fracture 10/24/2010   Diabetic retinopathy 03/21/2005   Essential hypertension 11/20/1999   GERD (gastroesophageal reflux disease) 01/19/1989   Pure hypercholesterolemia 04/21/1988    Orientation RESPIRATION BLADDER Height & Weight     Self, Place  O2 (2L nasal cannula) Continent, External catheter Weight: 242 lb 15.2 oz (110.2 kg) (Bed weight) Height:  6' 2 (188 cm)  BEHAVIORAL SYMPTOMS/MOOD NEUROLOGICAL BOWEL NUTRITION STATUS      Incontinent Diet (See dc summary)  AMBULATORY STATUS COMMUNICATION OF NEEDS Skin   Extensive Assist Verbally Surgical wounds (closed incision on leg)                       Personal Care Assistance Level of Assistance  Bathing, Feeding, Dressing Bathing Assistance: Maximum assistance Feeding assistance: Limited assistance Dressing Assistance: Maximum assistance     Functional Limitations Info  Sight, Hearing Sight Info: Impaired Hearing Info: Impaired      SPECIAL CARE FACTORS FREQUENCY  PT (By licensed PT), OT (By licensed OT)     PT Frequency: 5x/week OT Frequency: 5x/week  Contractures Contractures Info: Not present    Additional Factors Info  Code Status, Allergies Code Status Info: Full Allergies Info: Hydralazine , Promethazine Hcl           Current Medications (04/18/2024):  This is the current hospital active medication list Current Facility-Administered Medications  Medication Dose Route Frequency Provider Last Rate Last Admin    acetaminophen  (TYLENOL ) tablet 650 mg  650 mg Oral Q6H PRN Deward Eck, PA-C   650 mg at 04/17/24 2158   amLODipine  (NORVASC ) tablet 10 mg  10 mg Oral Daily Singh, Prashant K, MD   10 mg at 04/18/24 9342   ascorbic acid  (VITAMIN C ) tablet 1,000 mg  1,000 mg Oral Daily Deward Eck, PA-C   1,000 mg at 04/18/24 9140   atorvastatin  (LIPITOR) tablet 10 mg  10 mg Oral QHS Deward Eck, PA-C   10 mg at 04/17/24 2157   cholecalciferol  (VITAMIN D3) 25 MCG (1000 UNIT) tablet 2,000 Units  2,000 Units Oral BID Deward Eck, PA-C   2,000 Units at 04/18/24 9140   escitalopram  (LEXAPRO ) tablet 10 mg  10 mg Oral Daily Deward Eck, PA-C   10 mg at 04/18/24 9140   hydrochlorothiazide  (HYDRODIURIL ) tablet 12.5 mg  12.5 mg Oral Daily Singh, Prashant K, MD   12.5 mg at 04/18/24 0859   HYDROcodone -acetaminophen  (NORCO/VICODIN) 5-325 MG per tablet 1 tablet  1 tablet Oral Q6H PRN Singh, Prashant K, MD   1 tablet at 04/17/24 2329   insulin  aspart (novoLOG ) injection 0-15 Units  0-15 Units Subcutaneous TID WC Christobal Guadalajara, MD   2 Units at 04/18/24 0858   insulin  aspart (novoLOG ) injection 2 Units  2 Units Subcutaneous TID WC Christobal Guadalajara, MD   2 Units at 04/18/24 0858   insulin  glargine (LANTUS ) injection 10 Units  10 Units Subcutaneous Daily Lenon Marien CROME, MD   10 Units at 04/18/24 0900   losartan  (COZAAR ) tablet 100 mg  100 mg Oral Daily Deward Eck, PA-C   100 mg at 04/18/24 9140   memantine  (NAMENDA ) tablet 5 mg  5 mg Oral BID Deward Eck, PA-C   5 mg at 04/18/24 9140   multivitamin with minerals tablet 1 tablet  1 tablet Oral Daily Christobal Guadalajara, MD   1 tablet at 04/18/24 9140   nutrition supplement (JUVEN) (JUVEN) powder packet 1 packet  1 packet Oral BID BM Kc, Gu[place], MD   1 packet at 04/18/24 0900   ondansetron  (ZOFRAN ) injection 4 mg  4 mg Intravenous Q6H PRN Deward Eck, PA-C       polyethylene glycol (MIRALAX  / GLYCOLAX ) packet 17 g  17 g Oral BID Lenon Marien CROME, MD   17 g at 04/17/24 2158   protein supplement  (ENSURE MAX) liquid  11 oz Oral BID BM Deward Eck, PA-C   11 oz at 04/18/24 0900   tamsulosin  (FLOMAX ) capsule 0.4 mg  0.4 mg Oral QHS Deward Eck, PA-C   0.4 mg at 04/17/24 2158     Discharge Medications: Please see discharge summary for a list of discharge medications.  Relevant Imaging Results:  Relevant Lab Results:   Additional Information SSN [id number]  Inocente GORMAN Kindle, LCSW     "

## 2024-04-18 NOTE — Progress Notes (Addendum)
 " PROGRESS NOTE Dennis Wise    DOB: 07/26/1950, 73 y.o.  FMW:[id number]    Code Status: Full Code   DOA: 04/11/2024   LOS: 7  Brief hospital course  Dennis Wise is a 73 y.o. male with a PMH significant for CVA on aspirin /Plavix , anemia of chronic disease and iron  deficiency, chronic thrombocytopenia, CKD stage IIIa, HTN, HLD, T2DM, GAD, s/p left elbow ORIF 02/08/2024 and left THA 02/10/2024 who is admitted for left periprosthetic hip fracture. Ortho brought back to OR for fixation 12/23 and is recovering as expected with wound vac in place. Ortho surgery has been following for this PT/OT are working with him and recommending SNF.  This admission is complicated by his anemia and thrombocytopenia with splenomegaly.  Hematology has been following for this.   Assessment & Plan   Acute left periprosthetic hip fracture: s/p ORIF left femur 12/23  - ortho following, no weightbearing in left leg for 8 weeks, had some issues with postop blood loss, Ortho following bleeding seems to have stabilized on 04/18/2024, SCDs for DVT prophylaxis for ongoing bleeding.  Continue PT OT.  Will require SNF.  Left olecranon fracture- s/p fixation - OT, PT - keep elevated - analgesia PRN   Acute on chronic anemia-  S/p 3 plateletes so far, last on 04/12/2024, 1 ffp on 04/12/2024 and then again on 04/13/2024, and 12 units of packed RBC this admission, last 2 units on 04/16/2024, thrombocytopenia presumed secondary to splenic sequestration, seen by hematology, per orthopedics wound VAC site stable, CT abdomen pelvis and left hip on 04/17/2024 does not show any unexplained blood or hematoma, blood count seems to be stabilizing on 04/18/2024.  Monitor with caution.  Thrombocytopenia secondary to splenomegaly - Set platelet transfusion threshold at <50,000/?L unless active bleeding.  Stabilizing.  CKD stage IIIa Mild hyperkalemia: resolved Renal function relatively stable.  Continue to monitor.    History of  CVA: Hold Plavix  for now, no further drop in H&H will resume Plavix  in a day or 2.   Hypertension: BP well-controlled.  Continue losartan  and HCTZ.  Added Norvasc  for better control.   Hyperlipidemia: Continue atorvastatin .   Mild vascular neurocognitive disorder GAD: Stable, continue Lexapro  and Namenda .keep on  delirium precautions.   Class I Obesity w/ Body mass index is 31.19 kg/m.: Will benefit with PCP follow-up, weight loss,healthy lifestyle and outpatient eval  Type 2 diabetes: Poorly controlled hyperglycemia.PTA on glipizide metformin  Actos , holding p.o. meds.On SSI 0 to 15 units, add Semglee  10 units and novolog  2 u premeal   Lab Results  Component Value Date   HGBA1C 5.6 03/24/2024   CBG (last 3)  Recent Labs    04/17/24 1213 04/17/24 1633 04/18/24 0826  GLUCAP 207* 208* 147*     Body mass index is 31.19 kg/m.  VTE ppx: SCDs Start: 04/12/24 2251  Diet:     Diet   Diet Carb Modified Room service appropriate? Yes   Family communication.  Wife Nena [phone] on 04/18/2024.    Consultants: Ortho surgery  Heme/onc  Subjective 04/18/2024    Patient in bed, appears comfortable, denies any headache, no fever, no chest pain or pressure, no shortness of breath , no abdominal pain. No new focal weakness.   Objective   Blood pressure (!) 159/53, pulse 70, temperature 98.6 F (37 C), temperature source Oral, resp. rate 11, height 6' 2 (1.88 m), weight 110.2 kg, SpO2 99%.  Intake/Output Summary (Last 24 hours) at 04/18/2024 0839 Last data filed at 04/18/2024  0030 Gross per 24 hour  Intake 240 ml  Output 1950 ml  Net -1710 ml   Filed Weights   04/11/24 2034 04/12/24 1143  Weight: 122.5 kg 110.2 kg    Physical Exam:   Awake Alert, No new F.N deficits, Normal affect [place].AT,PERRAL Supple Neck, No JVD,   Symmetrical Chest wall movement, Good air movement bilaterally, CTAB RRR,No Gallops, Rubs or new Murmurs,  +ve B.Sounds, Abd Soft, No  tenderness,   Left hip postop site appears stable, wound VAC has some bloody discharge   Inpatient Medications  Scheduled Meds:  amLODipine   10 mg Oral Daily   vitamin C   1,000 mg Oral Daily   atorvastatin   10 mg Oral QHS   cholecalciferol   2,000 Units Oral BID   escitalopram   10 mg Oral Daily   hydrochlorothiazide   12.5 mg Oral Daily   insulin  aspart  0-15 Units Subcutaneous TID WC   insulin  aspart  2 Units Subcutaneous TID WC   insulin  glargine  10 Units Subcutaneous Daily   losartan   100 mg Oral Daily   memantine   5 mg Oral BID   multivitamin with minerals  1 tablet Oral Daily   nutrition supplement (JUVEN)  1 packet Oral BID BM   polyethylene glycol  17 g Oral BID   Ensure Max Protein  11 oz Oral BID BM   tamsulosin   0.4 mg Oral QHS   Continuous Infusions: PRN Meds:.acetaminophen , HYDROcodone -acetaminophen , ondansetron  (ZOFRAN ) IV  DVT Prophylaxis  SCDs Start: 04/12/24 2251   Recent Labs  Lab 04/13/24 1024 04/13/24 2205 04/14/24 0223 04/15/24 0540 04/16/24 0607 04/17/24 0534 04/18/24 0436  WBC 8.5  --  7.5 7.8 6.4 5.5 5.1  HGB 7.6*   < > 7.9* 7.5* 7.2* 7.9* 9.2*  HCT 21.5*   < > 22.2* 21.7* 20.5* 22.4* 26.4*  PLT 77*  --  65* 68* 75* 80* 96*  MCV 86.0  --  86.7 87.9 88.4 88.2 88.6  MCH 30.4  --  30.9 30.4 31.0 31.1 30.9  MCHC 35.3  --  35.6 34.6 35.1 35.3 34.8  RDW 16.4*  --  16.4* 16.4* 16.1* 15.8* 15.2  LYMPHSABS 0.8  --  0.8  --  0.8 0.7 0.9  MONOABS 1.1*  --  0.9  --  0.6 0.7 0.7  EOSABS 0.0  --  0.1  --  0.1 0.3 0.4  BASOSABS 0.0  --  0.0  --  0.0 0.0 0.0   < > = values in this interval not displayed.    Recent Labs  Lab 04/11/24 2044 04/12/24 0344 04/14/24 0223 04/15/24 0540 04/16/24 0607 04/17/24 0534 04/17/24 1301 04/18/24 0436  NA 136   < > 136 135 134* 134*  --  136  K 4.9   < > 4.3 4.5 4.3 4.1  --  4.1  CL 102   < > 102 102 101 101  --  102  CO2 25   < > 25 24 24 24   --  25  ANIONGAP 9   < > 9 9 9 9   --  9  GLUCOSE 197*   < > 157*  158* 178* 210*  --  127*  BUN 39*   < > 49* 52* 65* 64*  --  62*  CREATININE 1.40*   < > 1.93* 1.72* 1.77* 1.53*  --  1.35*  INR 1.0  --  1.1  --   --   --  1.1  --   MG  --   --   --   --  1.9 1.8  --  1.8  PHOS  --   --   --   --  3.2 2.6  --  2.5  CALCIUM  8.6*   < > 7.9* 8.1* 8.2* 8.1*  --  8.9   < > = values in this interval not displayed.      Recent Labs  Lab 04/11/24 2044 04/12/24 0344 04/14/24 0223 04/15/24 0540 04/16/24 0607 04/17/24 0534 04/17/24 1301 04/18/24 0436  INR 1.0  --  1.1  --   --   --  1.1  --   MG  --   --   --   --  1.9 1.8  --  1.8  CALCIUM  8.6*   < > 7.9* 8.1* 8.2* 8.1*  --  8.9   < > = values in this interval not displayed.    --------------------------------------------------------------------------------------------------------------- Lab Results  Component Value Date   CHOL 165 02/19/2023   HDL 97.00 02/19/2023   LDLCALC 53 02/19/2023   LDLDIRECT 74.0 10/16/2015   TRIG 77.0 02/19/2023   CHOLHDL 2 02/19/2023    Lab Results  Component Value Date   HGBA1C 5.6 03/24/2024   No results for input(s): TSH, T4TOTAL, FREET4, T3FREE, THYROIDAB in the last 72 hours. No results for input(s): VITAMINB12, FOLATE, FERRITIN, TIBC, IRON , RETICCTPCT in the last 72 hours. ------------------------------------------------------------------------------------------------------------------ Cardiac Enzymes No results for input(s): CKMB, TROPONINI, MYOGLOBIN in the last 168 hours.  Invalid input(s): CK  Micro Results No results found for this or any previous visit (from the past 240 hours).  Radiology Reports  CT ABDOMEN PELVIS WO CONTRAST Result Date: 04/17/2024 CLINICAL DATA:  Abdominal pain. Decreased hematocrit. Recent left hip arthroplasty. EXAM: CT ABDOMEN AND PELVIS WITHOUT CONTRAST TECHNIQUE: Multidetector CT imaging of the abdomen and pelvis was performed following the standard protocol without IV contrast. RADIATION  DOSE REDUCTION: This exam was performed according to the departmental dose-optimization program which includes automated exposure control, adjustment of the mA and/or kV according to patient size and/or use of iterative reconstruction technique. COMPARISON:  03/19/2022 FINDINGS: Lower chest: New tiny bilateral pleural effusions. Hepatobiliary: No mass visualized on this unenhanced exam. Small gallstones are seen, but without signs of cholecystitis or biliary dilatation. Pancreas: No mass or inflammatory process visualized on this unenhanced exam. Spleen:  Within normal limits in size. Adrenals/Urinary tract: No evidence of urolithiasis or hydronephrosis. Unremarkable unopacified urinary bladder. Stomach/Bowel: No evidence of obstruction, inflammatory process, or abnormal fluid collections. Vascular/Lymphatic: No pathologically enlarged lymph nodes identified. No evidence of abdominal aortic aneurysm. Reproductive:  No mass or other significant abnormality. Other: No No evidence of retroperitoneal hemorrhage. Increased diffuse body wall edema. Musculoskeletal: No suspicious bone lesions identified. New left hip prosthesis in expected position. No evidence of left pelvic or hip hematoma. IMPRESSION: No evidence of acute abdominal or pelvic hemorrhage/hematoma. Increased diffuse body wall edema and new tiny bilateral pleural effusions, consistent with 3rd spacing. Cholelithiasis. No radiographic evidence of cholecystitis. Electronically Signed   By: Norleen DELENA Kil M.D.   On: 04/17/2024 12:17   CT HIP LEFT WO CONTRAST Result Date: 04/17/2024 CLINICAL DATA:  Status post fixation of a periprosthetic left femur fracture. Recurrent anemia. Concern for hematoma. EXAM: CT OF THE LEFT HIP WITHOUT CONTRAST TECHNIQUE: Multidetector CT imaging of the left hip was performed according to the standard protocol. Multiplanar CT image reconstructions were also generated. RADIATION DOSE REDUCTION: This exam was performed according to  the departmental dose-optimization program which includes automated exposure control, adjustment of the mA and/or kV according  to patient size and/or use of iterative reconstruction technique. COMPARISON:  CT and radiographs of the left femur dated 04/12/2024 FINDINGS: Bones/Joint/Cartilage Status post lateral cortical plate and screw fixation of an oblique periprosthetic fracture of the proximal to mid femoral shaft surrounding the distal third of the femoral stem with improved alignment. The distal aspect of the fixation construct and femur is not included within the field of view. Left total hip arthroplasty demonstrates normal alignment with cerclage wire fixation. Ligaments Ligaments are suboptimally evaluated by CT. Soft tissue and Muscles Streak artifact from indwelling orthopedic hardware within the left hip and femur limits detailed evaluation of the surrounding soft tissues. Fullness and edema of the visualized anterior compartment musculature of the thigh, predominantly involving the quadriceps musculature, appears similar to slightly increased compared to the prior exam. No discrete intramuscular collection, although, underlying intramuscular hematoma can not be entirely excluded. Postsurgical changes along the left lateral hip with surrounding subcutaneous soft tissue edema and ill-defined fluid. No loculated fluid collection. Calcification of the left common femoral artery and its major branches. IMPRESSION: 1. Streak artifact from indwelling orthopedic hardware within the left hip and femur limits detailed evaluation of the surrounding soft tissues. Fullness and edema of the visualized anterior compartment musculature of the thigh, predominantly involving the quadriceps musculature, appears similar to slightly increased compared to the prior exam. No discrete intramuscular collection, although, underlying intramuscular hematoma can not be entirely excluded. 2. Postsurgical changes along the left  lateral hip with surrounding subcutaneous soft tissue edema and ill-defined fluid. No loculated fluid collection. 3. Status post lateral cortical plate and screw fixation of a left femoral shaft periprosthetic fracture with improved alignment. Left hip arthroplasty demonstrates normal alignment. Electronically Signed   By: Harrietta Sherry M.D.   On: 04/17/2024 11:47      Signature  -   Lavada Stank M.D on 04/18/2024 at 8:39 AM   -  To page go to [email or web address]       "

## 2024-04-18 NOTE — TOC Progression Note (Addendum)
 Transition of Care [facility]) - Progression Note    Patient Details  Name: Dennis Wise MRN: [id number] Date of Birth: 03-02-1951  Transition of Care [facility]) CM/SW Contact  Inocente GORMAN Kindle, LCSW Phone Number: 04/18/2024, 11:52 AM  Clinical Narrative:    Per RNCM, spouse willing to consider SNF if [name] accepts. [name] is reviewing. Will require updated therapy note for insurance process if [name] can accept.      Barriers to Discharge: Continued Medical Work up, SNF pending bed offer, insurance authorization               Expected Discharge Plan and Services       Living arrangements for the past 2 months: Single Family Home                                       Social Drivers of Health (SDOH) Interventions SDOH Screenings   Food Insecurity: No Food Insecurity (04/13/2024)  Housing: Unknown (04/13/2024)  Transportation Needs: No Transportation Needs (04/13/2024)  Utilities: Patient Declined (04/13/2024)  Alcohol  Screen: Low Risk (05/05/2023)  Depression (PHQ2-9): Low Risk (03/24/2024)  Financial Resource Strain: Low Risk (02/03/2024)  Physical Activity: Inactive (02/03/2024)  Social Connections: Moderately Isolated (04/13/2024)  Stress: No Stress Concern Present (02/03/2024)  Tobacco Use: Low Risk (04/12/2024)  Health Literacy: Adequate Health Literacy (05/05/2023)    Readmission Risk Interventions     No data to display

## 2024-04-19 ENCOUNTER — Ambulatory Visit: Admitting: Psychiatry

## 2024-04-19 DIAGNOSIS — Z96649 Presence of unspecified artificial hip joint: Secondary | ICD-10-CM | POA: Diagnosis not present

## 2024-04-19 DIAGNOSIS — M978XXA Periprosthetic fracture around other internal prosthetic joint, initial encounter: Secondary | ICD-10-CM | POA: Diagnosis not present

## 2024-04-19 LAB — PHOSPHORUS: Phosphorus: 2.5 mg/dL (ref 2.5–4.6)

## 2024-04-19 LAB — CBC WITH DIFFERENTIAL/PLATELET
Abs Immature Granulocytes: 0.06 K/uL (ref 0.00–0.07)
Basophils Absolute: 0 K/uL (ref 0.0–0.1)
Basophils Relative: 1 %
Eosinophils Absolute: 0.2 K/uL (ref 0.0–0.5)
Eosinophils Relative: 5 %
HCT: 25.2 % — ABNORMAL LOW (ref 39.0–52.0)
Hemoglobin: 8.5 g/dL — ABNORMAL LOW (ref 13.0–17.0)
Immature Granulocytes: 1 %
Lymphocytes Relative: 14 %
Lymphs Abs: 0.7 K/uL (ref 0.7–4.0)
MCH: 29.6 pg (ref 26.0–34.0)
MCHC: 33.7 g/dL (ref 30.0–36.0)
MCV: 87.8 fL (ref 80.0–100.0)
Monocytes Absolute: 0.6 K/uL (ref 0.1–1.0)
Monocytes Relative: 12 %
Neutro Abs: 3.2 K/uL (ref 1.7–7.7)
Neutrophils Relative %: 67 %
Platelets: 106 K/uL — ABNORMAL LOW (ref 150–400)
RBC: 2.87 MIL/uL — ABNORMAL LOW (ref 4.22–5.81)
RDW: 14.7 % (ref 11.5–15.5)
WBC: 4.8 K/uL (ref 4.0–10.5)
nRBC: 0 % (ref 0.0–0.2)

## 2024-04-19 LAB — BASIC METABOLIC PANEL WITH GFR
Anion gap: 10 (ref 5–15)
BUN: 63 mg/dL — ABNORMAL HIGH (ref 8–23)
CO2: 25 mmol/L (ref 22–32)
Calcium: 8.6 mg/dL — ABNORMAL LOW (ref 8.9–10.3)
Chloride: 100 mmol/L (ref 98–111)
Creatinine, Ser: 1.35 mg/dL — ABNORMAL HIGH (ref 0.61–1.24)
GFR, Estimated: 55 mL/min — ABNORMAL LOW
Glucose, Bld: 275 mg/dL — ABNORMAL HIGH (ref 70–99)
Potassium: 4.1 mmol/L (ref 3.5–5.1)
Sodium: 135 mmol/L (ref 135–145)

## 2024-04-19 LAB — GLUCOSE, CAPILLARY
Glucose-Capillary: 228 mg/dL — ABNORMAL HIGH (ref 70–99)
Glucose-Capillary: 182 mg/dL — ABNORMAL HIGH (ref 70–99)
Glucose-Capillary: 103 mg/dL — ABNORMAL HIGH (ref 70–99)

## 2024-04-19 LAB — MAGNESIUM: Magnesium: 1.7 mg/dL (ref 1.7–2.4)

## 2024-04-19 MED ORDER — ISOSORBIDE MONONITRATE ER 30 MG PO TB24
30.0000 mg | ORAL_TABLET | Freq: Every day | ORAL | Status: DC
  Administered 2024-04-19 – 2024-04-20 (×2): 30 mg via ORAL
  Filled 2024-04-19 (×2): qty 1

## 2024-04-19 MED ORDER — DOCUSATE SODIUM 100 MG PO CAPS
200.0000 mg | ORAL_CAPSULE | Freq: Two times a day (BID) | ORAL | Status: DC
  Administered 2024-04-19 – 2024-04-23 (×8): 200 mg via ORAL
  Filled 2024-04-19 (×8): qty 2

## 2024-04-19 MED ORDER — FERROUS SULFATE 325 (65 FE) MG PO TABS
325.0000 mg | ORAL_TABLET | Freq: Two times a day (BID) | ORAL | Status: DC
  Administered 2024-04-19 – 2024-04-23 (×8): 325 mg via ORAL
  Filled 2024-04-19 (×8): qty 1

## 2024-04-19 MED ORDER — FOLIC ACID 1 MG PO TABS
1.0000 mg | ORAL_TABLET | Freq: Every day | ORAL | Status: DC
  Administered 2024-04-19 – 2024-04-23 (×5): 1 mg via ORAL
  Filled 2024-04-19 (×5): qty 1

## 2024-04-19 NOTE — Progress Notes (Signed)
 Physical Therapy Treatment Patient Details Name: Dennis Wise MRN: [id number] DOB: [birth year] Today's Date: 04/19/2024   History of Present Illness 73 yo M adm 12/22 due to fall with left periprosthetic femur fracture around long hip stem s/p ORIF L femur. PMH 01/2024 fall with L THA and ORIF L elbow, NAFLD, ischemc R PCA stroke (2024), HTN, DM2, hearing loss, C2-T2 fusion (12/2023).    PT Comments  Pt received in supine and agreeable to session with encouragement. Pt continues to be limited by L hip pain with mobility, but demonstrates improved tolerance sitting EOB. Pt able to participate in hygiene tasks and eating sitting EOB. Pt able to perform LLE LAQ sitting EOB progressing from AAROM to AROM, which is improved from previous session. Pt able to participate in lateral scoots along EOB, but continues to require max A +2 due to pain and weakness. Education provided on precautions and WB status. Pt continues to benefit from PT services to progress toward functional mobility goals.     If plan is discharge home, recommend the following: Two people to help with walking and/or transfers;Two people to help with bathing/dressing/bathroom   Can travel by private vehicle     No  Equipment Recommendations  Other (comment) (defer)    Recommendations for Other Services       Precautions / Restrictions Precautions Precautions: Fall;Posterior Hip Recall of Precautions/Restrictions: Impaired Precaution/Restrictions Comments: Visual deficits ( L field cut), wound vac Restrictions Weight Bearing Restrictions Per Provider Order: Yes LLE Weight Bearing Per Provider Order: Non weight bearing Other Position/Activity Restrictions: no WB or ROM restricitons with L elbow - cleared by [facility] 12/24     Mobility  Bed Mobility Overal bed mobility: Needs Assistance Bed Mobility: Supine to Sit, Sit to Supine     Supine to sit: Max assist, +2 for physical assistance, Used rails, HOB elevated Sit to  supine: +2 for physical assistance, Max assist   General bed mobility comments: Exit to L EOB with step by step cues for sequencing. Pt able to grap rail with RUE and advance BLE to EOB with assist for LLE. Assist for trunk elevation via HHA, and scooting hips to EOB. Pt able to lean onto L elbow to return to supine, but requires assist for BLE elevation    Transfers Overall transfer level: Needs assistance Equipment used: None Transfers: Bed to chair/wheelchair/BSC            Lateral/Scoot Transfers: Max assist, +2 physical assistance General transfer comment: Pt able to perform lateral scoots to L towards HOB with max A +2 with bedpad and cues for technique. L heel floated to ensure NWB    Ambulation/Gait                   Stairs             Wheelchair Mobility     Tilt Bed    Modified Rankin (Stroke Patients Only)       Balance Overall balance assessment: History of Falls, Needs assistance Sitting-balance support: Bilateral upper extremity supported, Feet supported, Single extremity supported Sitting balance-Leahy Scale: Fair Sitting balance - Comments: CGA sitting EOB                                    Communication Communication Communication: Impaired Factors Affecting Communication: Hearing impaired  Cognition Arousal: Alert Behavior During Therapy: WFL for tasks assessed/performed   PT -  Cognitive impairments: Awareness, Safety/Judgement, Problem solving, Sequencing                       PT - Cognition Comments: Decreased awareness of deficits and memory of current restrictions. pt reports he can get to a toilet despite requiring max A +2 to sit EOB Following commands: Impaired Following commands impaired: Follows one step commands with increased time    Cueing Cueing Techniques: Verbal cues  Exercises General Exercises - Lower Extremity Long Arc Quad: AROM, AAROM, Seated, Left, 5 reps Heel Slides: AAROM, Supine,  Left, 5 reps    General Comments        Pertinent Vitals/Pain Pain Assessment Pain Assessment: Faces Faces Pain Scale: Hurts whole lot Pain Location: LLE with mobility Pain Descriptors / Indicators: Operative site guarding, Grimacing, Other (Comment) (yelling) Pain Intervention(s): Limited activity within patient's tolerance, Premedicated before session, Monitored during session, Repositioned     PT Goals (current goals can now be found in the care plan section) Acute Rehab PT Goals Patient Stated Goal: less pain PT Goal Formulation: With patient Time For Goal Achievement: 04/27/24 Progress towards PT goals: Progressing toward goals    Frequency    Min 2X/week      PT Plan      Co-evaluation PT/OT/SLP Co-Evaluation/Treatment: Yes Reason for Co-Treatment: Complexity of the patient's impairments (multi-system involvement);Necessary to address cognition/behavior during functional activity;For patient/therapist safety;To address functional/ADL transfers PT goals addressed during session: Mobility/safety with mobility;Balance        AM-PAC PT 6 Clicks Mobility   Outcome Measure  Help needed turning from your back to your side while in a flat bed without using bedrails?: Total Help needed moving from lying on your back to sitting on the side of a flat bed without using bedrails?: Total Help needed moving to and from a bed to a chair (including a wheelchair)?: Total Help needed standing up from a chair using your arms (e.g., wheelchair or bedside chair)?: Total Help needed to walk in hospital room?: Total Help needed climbing 3-5 steps with a railing? : Total 6 Click Score: 6    End of Session   Activity Tolerance: Patient limited by pain Patient left: in bed;with call bell/phone within reach;with bed alarm set;with family/visitor present Nurse Communication: Mobility status PT Visit Diagnosis: Pain;Other abnormalities of gait and mobility (R26.89);History of  falling (Z91.81) Pain - Right/Left: Left Pain - part of body: Hip     Time: 1032-1109 PT Time Calculation (min) (ACUTE ONLY): 37 min  Charges:    $Therapeutic Activity: 8-22 mins PT General Charges $$ ACUTE PT VISIT: 1 Visit                    [name], PTA Acute Rehabilitation Services Secure Chat Preferred  [phone]    [name] 04/19/2024, 12:21 PM

## 2024-04-19 NOTE — Plan of Care (Signed)
  Problem: Education: Goal: Ability to describe self-care measures that may prevent or decrease complications (Diabetes Survival Skills Education) will improve Outcome: Progressing Goal: Individualized Educational Video(s) Outcome: Progressing   Problem: Coping: Goal: Ability to adjust to condition or change in health will improve Outcome: Progressing   Problem: Fluid Volume: Goal: Ability to maintain a balanced intake and output will improve Outcome: Progressing   Problem: Health Behavior/Discharge Planning: Goal: Ability to identify and utilize available resources and services will improve Outcome: Progressing Goal: Ability to manage health-related needs will improve Outcome: Progressing   Problem: Metabolic: Goal: Ability to maintain appropriate glucose levels will improve Outcome: Progressing   Problem: Nutritional: Goal: Maintenance of adequate nutrition will improve Outcome: Progressing Goal: Progress toward achieving an optimal weight will improve Outcome: Progressing   Problem: Skin Integrity: Goal: Risk for impaired skin integrity will decrease Outcome: Progressing   Problem: Tissue Perfusion: Goal: Adequacy of tissue perfusion will improve Outcome: Progressing   Problem: Education: Goal: Knowledge of General Education information will improve Description: Including pain rating scale, medication(s)/side effects and non-pharmacologic comfort measures Outcome: Progressing   Problem: Health Behavior/Discharge Planning: Goal: Ability to manage health-related needs will improve Outcome: Progressing   Problem: Clinical Measurements: Goal: Ability to maintain clinical measurements within normal limits will improve Outcome: Progressing Goal: Will remain free from infection Outcome: Progressing Goal: Diagnostic test results will improve Outcome: Progressing Goal: Respiratory complications will improve Outcome: Progressing Goal: Cardiovascular complication will  be avoided Outcome: Progressing   Problem: Activity: Goal: Risk for activity intolerance will decrease Outcome: Progressing   Problem: Nutrition: Goal: Adequate nutrition will be maintained Outcome: Progressing   Problem: Coping: Goal: Level of anxiety will decrease Outcome: Progressing   Problem: Elimination: Goal: Will not experience complications related to bowel motility Outcome: Progressing Goal: Will not experience complications related to urinary retention Outcome: Progressing   Problem: Pain Managment: Goal: General experience of comfort will improve and/or be controlled Outcome: Progressing   Problem: Safety: Goal: Ability to remain free from injury will improve Outcome: Progressing   Problem: Skin Integrity: Goal: Risk for impaired skin integrity will decrease Outcome: Progressing   Problem: Education: Goal: Verbalization of understanding the information provided (i.e., activity precautions, restrictions, etc) will improve Outcome: Progressing Goal: Individualized Educational Video(s) Outcome: Progressing   Problem: Activity: Goal: Ability to ambulate and perform ADLs will improve Outcome: Progressing   Problem: Clinical Measurements: Goal: Postoperative complications will be avoided or minimized Outcome: Progressing   Problem: Self-Concept: Goal: Ability to maintain and perform role responsibilities to the fullest extent possible will improve Outcome: Progressing   Problem: Pain Management: Goal: Pain level will decrease Outcome: Progressing

## 2024-04-19 NOTE — TOC Progression Note (Signed)
 Transition of Care [facility]) - Progression Note    Patient Details  Name: Dennis Wise MRN: [id number] Date of Birth: 04-26-1950  [facility] CM/SW Contact  Inocente GORMAN Kindle, LCSW Phone Number: 04/19/2024, 1:16 PM  Clinical Narrative:    1:16 PM-[name] not sure if patient can tolerate therapies but will look at his updated notes from today. They do not currently have a bed available yet either.      Barriers to Discharge: Continued Medical Work up               Expected Discharge Plan and Services       Living arrangements for the past 2 months: Single Family Home                                       Social Drivers of Health (SDOH) Interventions SDOH Screenings   Food Insecurity: No Food Insecurity (04/13/2024)  Housing: Unknown (04/13/2024)  Transportation Needs: No Transportation Needs (04/13/2024)  Utilities: Patient Declined (04/13/2024)  Alcohol  Screen: Low Risk (05/05/2023)  Depression (PHQ2-9): Low Risk (03/24/2024)  Financial Resource Strain: Low Risk (02/03/2024)  Physical Activity: Inactive (02/03/2024)  Social Connections: Moderately Isolated (04/13/2024)  Stress: No Stress Concern Present (02/03/2024)  Tobacco Use: Low Risk (04/12/2024)  Health Literacy: Adequate Health Literacy (05/05/2023)    Readmission Risk Interventions     No data to display

## 2024-04-19 NOTE — Progress Notes (Signed)
 Occupational Therapy Treatment Patient Details Name: Dennis Wise MRN: [id number] DOB: [birth year] Today's Date: 04/19/2024   History of present illness 72 yo M adm 12/22 due to fall with left periprosthetic femur fracture around long hip stem s/p ORIF L femur. PMH 01/2024 fall with L THA and ORIF L elbow, NAFLD, ischemc R PCA stroke (2024), HTN, DM2, hearing loss, C2-T2 fusion (12/2023).   OT comments  Pt making progress with functional goals. Pt in bed upon arrival with wife present and agreeable to session with encouragement. Pt sat EOB max A +2 with use of bed pads underneath him to rotate hips, continues to be limited by L hip pain with mobility, but able to tolerate sitting EOB ~20 minutes for ADL/hygiene and feeding tasks. Pt able to lateral scoot along EOB, but continues to require max A +2 due to pain and weakness. Pt educated on the importance/benefits of mobility and participation as much of his own self care as possible to maximize recovery. OT will continue to follow acutely to maximize level of function and safety       If plan is discharge home, recommend the following:  A lot of help with bathing/dressing/bathroom;Two people to help with walking and/or transfers;Assist for transportation;Help with stairs or ramp for entrance   Equipment Recommendations  Other (comment) (defer)    Recommendations for Other Services      Precautions / Restrictions Precautions Precautions: Fall;Posterior Hip Recall of Precautions/Restrictions: Impaired Restrictions Weight Bearing Restrictions Per Provider Order: Yes LLE Weight Bearing Per Provider Order: Non weight bearing Other Position/Activity Restrictions: no WB or ROM restricitons with L elbow - cleared by [facility] 12/24       Mobility Bed Mobility Overal bed mobility: Needs Assistance Bed Mobility: Supine to Sit, Sit to Supine     Supine to sit: Max assist, +2 for physical assistance, Used rails, HOB elevated Sit to supine: +2  for physical assistance, Max assist   General bed mobility comments: Exit to L EOB with step by step cues for sequencing. Pt able to grap rail with RUE and advance BLE to EOB with assist for LLE. Assist for trunk elevation via HHA, and scooting hips to EOB. Pt able to lean onto L elbow to return to supine, but requires assist for BLE elevation    Transfers Overall transfer level: Needs assistance Equipment used: None              Lateral/Scoot Transfers: Max assist, +2 physical assistance General transfer comment: Pt able to perform lateral scoots to L towards HOB with max A +2 with bedpad and cues for technique. L heel floated to ensure NWB     Balance Overall balance assessment: History of Falls, Needs assistance Sitting-balance support: Bilateral upper extremity supported, Feet supported, Single extremity supported Sitting balance-Leahy Scale: Fair Sitting balance - Comments: CGA sitting EOB                                   ADL either performed or assessed with clinical judgement   ADL Overall ADL's : Needs assistance/impaired Eating/Feeding: Set up;Independent;Sitting Eating/Feeding Details (indicate cue type and reason): pt asking wife to feed him, therapy encouraged pt to feed himself Grooming: Wash/dry hands;Wash/dry face;Brushing hair;Contact guard assist;Sitting   Upper Body Bathing: Minimal assistance;Sitting       Upper Body Dressing : Minimal assistance;Sitting   Lower Body Dressing: Total assistance Lower Body Dressing Details (indicate  cue type and reason): donning socks   Toilet Transfer Details (indicate cue type and reason): wil need +2 to safely transfer Toileting- Clothing Manipulation and Hygiene: Total assistance;Bed level         General ADL Comments: pt limited by pain. Pt sat EOB ~10 minutes for ADL tasks and to eat fruit    Extremity/Trunk Assessment Upper Extremity Assessment Upper Extremity Assessment: Generalized  weakness;RUE deficits/detail LUE Deficits / Details: recent L elbow fx; no orders in chart; limited use of LUE at this time. Unable to reach to bedside table with LUE   Lower Extremity Assessment Lower Extremity Assessment: Defer to PT evaluation        Vision Baseline Vision/History: 1 Wears glasses Ability to See in Adequate Light: 1 Impaired Patient Visual Report: Peripheral vision impairment     Perception     Praxis     Communication Communication Communication: Impaired Factors Affecting Communication: Hearing impaired   Cognition Arousal: Alert Behavior During Therapy: WFL for tasks assessed/performed                                 Following commands: Impaired Following commands impaired: Follows one step commands with increased time      Cueing   Cueing Techniques: Verbal cues  Exercises      Shoulder Instructions       General Comments      Pertinent Vitals/ Pain       Pain Assessment Pain Assessment: Faces Faces Pain Scale: Hurts whole lot Pain Location: LLE with mobility Pain Descriptors / Indicators: Operative site guarding, Grimacing, Other (Comment) (yelling out) Pain Intervention(s): Limited activity within patient's tolerance, Monitored during session, Premedicated before session, Repositioned  Home Living                                          Prior Functioning/Environment              Frequency  Min 1X/week        Progress Toward Goals  OT Goals(current goals can now be found in the care plan section)  Progress towards OT goals: Progressing toward goals     Plan      Co-evaluation    PT/OT/SLP Co-Evaluation/Treatment: Yes Reason for Co-Treatment: Complexity of the patient's impairments (multi-system involvement);Necessary to address cognition/behavior during functional activity;For patient/therapist safety;To address functional/ADL transfers PT goals addressed during session:  Mobility/safety with mobility;Balance OT goals addressed during session: ADL's and self-care      AM-PAC OT 6 Clicks Daily Activity     Outcome Measure   Help from another person eating meals?: None Help from another person taking care of personal grooming?: A Little Help from another person toileting, which includes using toliet, bedpan, or urinal?: Total Help from another person bathing (including washing, rinsing, drying)?: A Lot Help from another person to put on and taking off regular upper body clothing?: A Little Help from another person to put on and taking off regular lower body clothing?: Total 6 Click Score: 14    End of Session    OT Visit Diagnosis: Unsteadiness on feet (R26.81);Other abnormalities of gait and mobility (R26.89);Repeated falls (R29.6);Muscle weakness (generalized) (M62.81);Low vision, both eyes (H54.2);Other symptoms and signs involving cognitive function;Pain Pain - Right/Left: Left Pain - part of body: Hip   Activity Tolerance Patient  limited by pain   Patient Left in bed;with call bell/phone within reach;with bed alarm set   Nurse Communication Mobility status        Time: 8968-8891 OT Time Calculation (min): 37 min  Charges: OT General Charges $OT Visit: 1 Visit OT Treatments $Self Care/Home Management : 8-22 mins    Jacques Karna Loose 04/19/2024, 12:34 PM

## 2024-04-19 NOTE — Progress Notes (Signed)
 " PROGRESS NOTE Dennis Wise    DOB: 28-Jul-1950, 73 y.o.  FMW:[id number]    Code Status: Full Code   DOA: 04/11/2024   LOS: 8  Brief hospital course   Dennis Wise is a 73 y.o. male with a PMH significant for CVA on aspirin /Plavix , anemia of chronic disease and iron  deficiency, chronic thrombocytopenia, CKD stage IIIa, HTN, HLD, T2DM, GAD, s/p left elbow ORIF 02/08/2024 and left THA 02/10/2024 who is admitted for left periprosthetic hip fracture. Ortho brought back to OR for fixation 12/23 and is recovering as expected with wound vac in place. Ortho surgery has been following for this PT/OT are working with him and recommending SNF.  This admission is complicated by his anemia and thrombocytopenia with splenomegaly. Hematology has been following for this.   Assessment & Plan   Acute left periprosthetic hip fracture: s/p ORIF left femur 12/23  - ortho following, no weightbearing in left leg for 8 weeks, had some issues with postop blood loss, Ortho following bleeding seems to have stabilized on 04/18/2024, SCDs for DVT prophylaxis for ongoing bleeding.  Continue PT OT.  Will require SNF.  Left olecranon fracture- s/p fixation - OT, PT - keep elevated - analgesia PRN   Acute on chronic anemia-  S/p 3 plateletes so far, last on 04/12/2024, 1 ffp on 04/12/2024 and then again on 04/13/2024, and 12 units of packed RBC this admission, last 2 units on 04/16/2024, thrombocytopenia presumed secondary to splenic sequestration, seen by hematology, per orthopedics wound VAC site stable, CT abdomen pelvis and left hip on 04/17/2024 does not show any unexplained blood or hematoma, blood count seems to be stabilizing on 04/18/2024.  Monitor with caution.  Thrombocytopenia secondary to splenomegaly  - Set platelet transfusion threshold at <50,000/?L unless active bleeding.  Stabilizing.  CKD stage IIIa Mild hyperkalemia: resolved Renal function relatively stable.  Continue to monitor.    History of  CVA: Hold Plavix  for now, no further drop in H&H will resume Plavix  on 04/20/2024   Hypertension: BP well-controlled.  Continue losartan  and HCTZ.  Added Norvasc  for better control.   Hyperlipidemia: Continue atorvastatin .   Mild vascular neurocognitive disorder GAD: Stable, continue Lexapro  and Namenda .keep on  delirium precautions.   Class I Obesity w/ Body mass index is 31.19 kg/m.: Will benefit with PCP follow-up, weight loss,healthy lifestyle and outpatient eval  Type 2 diabetes: Poorly controlled hyperglycemia.PTA on glipizide metformin  Actos , holding p.o. meds.On SSI 0 to 15 units, add Semglee  10 units and novolog  2 u premeal   Lab Results  Component Value Date   HGBA1C 5.6 03/24/2024   CBG (last 3)  Recent Labs    04/18/24 1210 04/18/24 1616 04/19/24 0747  GLUCAP 134* 140* 228*     Body mass index is 31.19 kg/m.  VTE ppx: SCDs Start: 04/12/24 2251  Diet:     Diet   Diet Carb Modified Room service appropriate? Yes   Family communication.  Wife Nena [phone] on 04/18/2024.    Consultants: Ortho surgery  Heme/onc  Subjective 04/19/2024    Patient in bed, appears comfortable, denies any headache, no fever, no chest pain or pressure, no shortness of breath , no abdominal pain. No new focal weakness.  Objective   Blood pressure (!) 159/53, pulse 70, temperature 98.6 F (37 C), temperature source Oral, resp. rate 11, height 6' 2 (1.88 m), weight 110.2 kg, SpO2 99%.  Intake/Output Summary (Last 24 hours) at 04/19/2024 0855 Last data filed at 04/19/2024 0746 Gross per  24 hour  Intake --  Output 2900 ml  Net -2900 ml   Filed Weights   04/11/24 2034 04/12/24 1143  Weight: 122.5 kg 110.2 kg    Physical Exam:   Awake Alert, No new F.N deficits, Normal affect [place].AT,PERRAL Supple Neck, No JVD,   Symmetrical Chest wall movement, Good air movement bilaterally, CTAB RRR,No Gallops, Rubs or new Murmurs,  +ve B.Sounds, Abd Soft, No tenderness,    Left hip postop site appears stable, wound VAC in place   Inpatient Medications  Scheduled Meds:  amLODipine   10 mg Oral Daily   vitamin C   1,000 mg Oral Daily   atorvastatin   10 mg Oral QHS   cholecalciferol   2,000 Units Oral BID   escitalopram   10 mg Oral Daily   hydrochlorothiazide   12.5 mg Oral Daily   insulin  aspart  0-15 Units Subcutaneous TID WC   insulin  aspart  2 Units Subcutaneous TID WC   insulin  glargine  10 Units Subcutaneous Daily   isosorbide  mononitrate  30 mg Oral Daily   losartan   100 mg Oral Daily   memantine   5 mg Oral BID   multivitamin with minerals  1 tablet Oral Daily   nutrition supplement (JUVEN)  1 packet Oral BID BM   polyethylene glycol  17 g Oral BID   Ensure Max Protein  11 oz Oral BID BM   tamsulosin   0.4 mg Oral QHS   Continuous Infusions: PRN Meds:.acetaminophen , HYDROcodone -acetaminophen , ondansetron  (ZOFRAN ) IV  DVT Prophylaxis  SCDs Start: 04/12/24 2251   Recent Labs  Lab 04/14/24 0223 04/15/24 0540 04/16/24 0607 04/17/24 0534 04/18/24 0436 04/19/24 0348  WBC 7.5 7.8 6.4 5.5 5.1 4.8  HGB 7.9* 7.5* 7.2* 7.9* 9.2* 8.5*  HCT 22.2* 21.7* 20.5* 22.4* 26.4* 25.2*  PLT 65* 68* 75* 80* 96* 106*  MCV 86.7 87.9 88.4 88.2 88.6 87.8  MCH 30.9 30.4 31.0 31.1 30.9 29.6  MCHC 35.6 34.6 35.1 35.3 34.8 33.7  RDW 16.4* 16.4* 16.1* 15.8* 15.2 14.7  LYMPHSABS 0.8  --  0.8 0.7 0.9 0.7  MONOABS 0.9  --  0.6 0.7 0.7 0.6  EOSABS 0.1  --  0.1 0.3 0.4 0.2  BASOSABS 0.0  --  0.0 0.0 0.0 0.0    Recent Labs  Lab 04/14/24 0223 04/15/24 0540 04/16/24 0607 04/17/24 0534 04/17/24 1301 04/18/24 0436 04/19/24 0348  NA 136 135 134* 134*  --  136 135  K 4.3 4.5 4.3 4.1  --  4.1 4.1  CL 102 102 101 101  --  102 100  CO2 25 24 24 24   --  25 25  ANIONGAP 9 9 9 9   --  9 10  GLUCOSE 157* 158* 178* 210*  --  127* 275*  BUN 49* 52* 65* 64*  --  62* 63*  CREATININE 1.93* 1.72* 1.77* 1.53*  --  1.35* 1.35*  INR 1.1  --   --   --  1.1  --   --   MG  --    --  1.9 1.8  --  1.8 1.7  PHOS  --   --  3.2 2.6  --  2.5 2.5  CALCIUM  7.9* 8.1* 8.2* 8.1*  --  8.9 8.6*      Recent Labs  Lab 04/14/24 0223 04/15/24 0540 04/16/24 0607 04/17/24 0534 04/17/24 1301 04/18/24 0436 04/19/24 0348  INR 1.1  --   --   --  1.1  --   --   MG  --   --  1.9 1.8  --  1.8 1.7  CALCIUM  7.9* 8.1* 8.2* 8.1*  --  8.9 8.6*    --------------------------------------------------------------------------------------------------------------- Lab Results  Component Value Date   CHOL 165 02/19/2023   HDL 97.00 02/19/2023   LDLCALC 53 02/19/2023   LDLDIRECT 74.0 10/16/2015   TRIG 77.0 02/19/2023   CHOLHDL 2 02/19/2023    Lab Results  Component Value Date   HGBA1C 5.6 03/24/2024   No results for input(s): TSH, T4TOTAL, FREET4, T3FREE, THYROIDAB in the last 72 hours. No results for input(s): VITAMINB12, FOLATE, FERRITIN, TIBC, IRON , RETICCTPCT in the last 72 hours. ------------------------------------------------------------------------------------------------------------------ Cardiac Enzymes No results for input(s): CKMB, TROPONINI, MYOGLOBIN in the last 168 hours.  Invalid input(s): CK  Micro Results No results found for this or any previous visit (from the past 240 hours).  Radiology Reports  CT ABDOMEN PELVIS WO CONTRAST Result Date: 04/17/2024 CLINICAL DATA:  Abdominal pain. Decreased hematocrit. Recent left hip arthroplasty. EXAM: CT ABDOMEN AND PELVIS WITHOUT CONTRAST TECHNIQUE: Multidetector CT imaging of the abdomen and pelvis was performed following the standard protocol without IV contrast. RADIATION DOSE REDUCTION: This exam was performed according to the departmental dose-optimization program which includes automated exposure control, adjustment of the mA and/or kV according to patient size and/or use of iterative reconstruction technique. COMPARISON:  03/19/2022 FINDINGS: Lower chest: New tiny bilateral pleural  effusions. Hepatobiliary: No mass visualized on this unenhanced exam. Small gallstones are seen, but without signs of cholecystitis or biliary dilatation. Pancreas: No mass or inflammatory process visualized on this unenhanced exam. Spleen:  Within normal limits in size. Adrenals/Urinary tract: No evidence of urolithiasis or hydronephrosis. Unremarkable unopacified urinary bladder. Stomach/Bowel: No evidence of obstruction, inflammatory process, or abnormal fluid collections. Vascular/Lymphatic: No pathologically enlarged lymph nodes identified. No evidence of abdominal aortic aneurysm. Reproductive:  No mass or other significant abnormality. Other: No No evidence of retroperitoneal hemorrhage. Increased diffuse body wall edema. Musculoskeletal: No suspicious bone lesions identified. New left hip prosthesis in expected position. No evidence of left pelvic or hip hematoma. IMPRESSION: No evidence of acute abdominal or pelvic hemorrhage/hematoma. Increased diffuse body wall edema and new tiny bilateral pleural effusions, consistent with 3rd spacing. Cholelithiasis. No radiographic evidence of cholecystitis. Electronically Signed   By: Norleen DELENA Kil M.D.   On: 04/17/2024 12:17   CT HIP LEFT WO CONTRAST Result Date: 04/17/2024 CLINICAL DATA:  Status post fixation of a periprosthetic left femur fracture. Recurrent anemia. Concern for hematoma. EXAM: CT OF THE LEFT HIP WITHOUT CONTRAST TECHNIQUE: Multidetector CT imaging of the left hip was performed according to the standard protocol. Multiplanar CT image reconstructions were also generated. RADIATION DOSE REDUCTION: This exam was performed according to the departmental dose-optimization program which includes automated exposure control, adjustment of the mA and/or kV according to patient size and/or use of iterative reconstruction technique. COMPARISON:  CT and radiographs of the left femur dated 04/12/2024 FINDINGS: Bones/Joint/Cartilage Status post lateral cortical  plate and screw fixation of an oblique periprosthetic fracture of the proximal to mid femoral shaft surrounding the distal third of the femoral stem with improved alignment. The distal aspect of the fixation construct and femur is not included within the field of view. Left total hip arthroplasty demonstrates normal alignment with cerclage wire fixation. Ligaments Ligaments are suboptimally evaluated by CT. Soft tissue and Muscles Streak artifact from indwelling orthopedic hardware within the left hip and femur limits detailed evaluation of the surrounding soft tissues. Fullness and edema of the visualized anterior compartment musculature of the thigh, predominantly  involving the quadriceps musculature, appears similar to slightly increased compared to the prior exam. No discrete intramuscular collection, although, underlying intramuscular hematoma can not be entirely excluded. Postsurgical changes along the left lateral hip with surrounding subcutaneous soft tissue edema and ill-defined fluid. No loculated fluid collection. Calcification of the left common femoral artery and its major branches. IMPRESSION: 1. Streak artifact from indwelling orthopedic hardware within the left hip and femur limits detailed evaluation of the surrounding soft tissues. Fullness and edema of the visualized anterior compartment musculature of the thigh, predominantly involving the quadriceps musculature, appears similar to slightly increased compared to the prior exam. No discrete intramuscular collection, although, underlying intramuscular hematoma can not be entirely excluded. 2. Postsurgical changes along the left lateral hip with surrounding subcutaneous soft tissue edema and ill-defined fluid. No loculated fluid collection. 3. Status post lateral cortical plate and screw fixation of a left femoral shaft periprosthetic fracture with improved alignment. Left hip arthroplasty demonstrates normal alignment. Electronically Signed   By:  Harrietta Sherry M.D.   On: 04/17/2024 11:47      Signature  -   Lavada Stank M.D on 04/19/2024 at 8:55 AM   -  To page go to [email or web address]       "

## 2024-04-19 NOTE — Progress Notes (Addendum)
 pt had some noted blood on scrotal area , minimal but new and has clotted off . Pt here for left hip fx and repair and thrombocytopenia , has received 12 units of RBCs this admission , last transfused on 12/27, vitals stable. On call provider aware of the above. Will continue to assess

## 2024-04-20 DIAGNOSIS — Z96649 Presence of unspecified artificial hip joint: Secondary | ICD-10-CM | POA: Diagnosis not present

## 2024-04-20 DIAGNOSIS — M978XXA Periprosthetic fracture around other internal prosthetic joint, initial encounter: Secondary | ICD-10-CM | POA: Diagnosis not present

## 2024-04-20 DIAGNOSIS — D696 Thrombocytopenia, unspecified: Secondary | ICD-10-CM | POA: Diagnosis not present

## 2024-04-20 DIAGNOSIS — D649 Anemia, unspecified: Secondary | ICD-10-CM | POA: Diagnosis not present

## 2024-04-20 LAB — BPAM RBC
Blood Product Expiration Date: 202601242359
Blood Product Expiration Date: 202601242359
ISSUE DATE / TIME: 202512271105
Unit Type and Rh: 7300
Unit Type and Rh: 7300

## 2024-04-20 LAB — BASIC METABOLIC PANEL WITH GFR
Anion gap: 9 (ref 5–15)
BUN: 64 mg/dL — ABNORMAL HIGH (ref 8–23)
CO2: 26 mmol/L (ref 22–32)
Calcium: 8.6 mg/dL — ABNORMAL LOW (ref 8.9–10.3)
Chloride: 102 mmol/L (ref 98–111)
Creatinine, Ser: 1.4 mg/dL — ABNORMAL HIGH (ref 0.61–1.24)
GFR, Estimated: 52 mL/min — ABNORMAL LOW
Glucose, Bld: 151 mg/dL — ABNORMAL HIGH (ref 70–99)
Potassium: 4.2 mmol/L (ref 3.5–5.1)
Sodium: 137 mmol/L (ref 135–145)

## 2024-04-20 LAB — CBC
HCT: 22.6 % — ABNORMAL LOW (ref 39.0–52.0)
Hemoglobin: 7.8 g/dL — ABNORMAL LOW (ref 13.0–17.0)
MCH: 30 pg (ref 26.0–34.0)
MCHC: 34.5 g/dL (ref 30.0–36.0)
MCV: 86.9 fL (ref 80.0–100.0)
Platelets: 118 K/uL — ABNORMAL LOW (ref 150–400)
RBC: 2.6 MIL/uL — ABNORMAL LOW (ref 4.22–5.81)
RDW: 14.9 % (ref 11.5–15.5)
WBC: 4.5 K/uL (ref 4.0–10.5)
nRBC: 0 % (ref 0.0–0.2)

## 2024-04-20 LAB — TYPE AND SCREEN
ABO/RH(D): B POS
Antibody Screen: NEGATIVE
Unit division: 0
Unit division: 0

## 2024-04-20 LAB — CBC WITH DIFFERENTIAL/PLATELET
Abs Immature Granulocytes: 0.07 K/uL (ref 0.00–0.07)
Basophils Absolute: 0 K/uL (ref 0.0–0.1)
Basophils Relative: 0 %
Eosinophils Absolute: 0.2 K/uL (ref 0.0–0.5)
Eosinophils Relative: 5 %
HCT: 22.9 % — ABNORMAL LOW (ref 39.0–52.0)
Hemoglobin: 7.7 g/dL — ABNORMAL LOW (ref 13.0–17.0)
Immature Granulocytes: 2 %
Lymphocytes Relative: 17 %
Lymphs Abs: 0.8 K/uL (ref 0.7–4.0)
MCH: 29.3 pg (ref 26.0–34.0)
MCHC: 33.6 g/dL (ref 30.0–36.0)
MCV: 87.1 fL (ref 80.0–100.0)
Monocytes Absolute: 0.5 K/uL (ref 0.1–1.0)
Monocytes Relative: 12 %
Neutro Abs: 2.9 K/uL (ref 1.7–7.7)
Neutrophils Relative %: 64 %
Platelets: 118 K/uL — ABNORMAL LOW (ref 150–400)
RBC: 2.63 MIL/uL — ABNORMAL LOW (ref 4.22–5.81)
RDW: 14.8 % (ref 11.5–15.5)
WBC: 4.5 K/uL (ref 4.0–10.5)
nRBC: 0 % (ref 0.0–0.2)

## 2024-04-20 LAB — GLUCOSE, CAPILLARY: Glucose-Capillary: 232 mg/dL — ABNORMAL HIGH (ref 70–99)

## 2024-04-20 MED ORDER — LOPERAMIDE HCL 2 MG PO CAPS
2.0000 mg | ORAL_CAPSULE | ORAL | Status: DC | PRN
  Administered 2024-04-21: 2 mg via ORAL
  Filled 2024-04-20: qty 1

## 2024-04-20 MED ORDER — ISOSORBIDE MONONITRATE ER 60 MG PO TB24
60.0000 mg | ORAL_TABLET | Freq: Every day | ORAL | Status: DC
  Administered 2024-04-21: 60 mg via ORAL
  Filled 2024-04-20: qty 1

## 2024-04-20 MED ORDER — MORPHINE SULFATE (PF) 2 MG/ML IV SOLN
2.0000 mg | INTRAVENOUS | Status: DC | PRN
  Administered 2024-04-20: 2 mg via INTRAVENOUS
  Filled 2024-04-20: qty 1

## 2024-04-20 NOTE — Progress Notes (Signed)
 Nutrition Follow-up  DOCUMENTATION CODES:   Obesity unspecified, Not applicable  INTERVENTION:  Continue Multivitamin w/ minerals daily Encourage good PO intake  Continue 1 packet Juven BID, each packet provides 95 calories, 2.5 grams of protein (collagen) to support wound healing Continue Ensure Max po BID, each supplement provides 150 kcal and 30 grams of protein.  Continue Vitamin D  2000 international units x 6 weeks and recommend recheck for correction of deficiency   NUTRITION DIAGNOSIS:  Increased nutrient needs related to post-op healing as evidenced by estimated needs. - Ongoing   GOAL:  Patient will meet greater than or equal to 90% of their needs - Progressing   MONITOR:  PO intake, Supplement acceptance, Labs, Weight trends, Skin  REASON FOR ASSESSMENT:  Consult Assessment of nutrition requirement/status, Hip fracture protocol  ASSESSMENT:  73 y.o. male with PMH of CVA, anemia of chronic disease and iron  deficiency, chronic thrombocytopenia, CKD stage IIIa, HTN, HLD, T2DM, GAD, GERD, s/p left elbow ORIF 02/08/2024 and left THA 02/10/2024 who presented to the ED for evaluation of left hip pain after fall at home.  12/22 - Admitted  12/23 - S/P ORIF left hip    Patient sleeping at time of RD visit, family at bedside. Family reports that he is eating better and at least eats something off all trays. Completed his breakfast this morning. Reports that he is drinking all the supplements as well. One episode of nausea yesterday while working with PT, but no additional complaints of nausea. Has snacks at bedside, RD encouraged patient to continue to work on PO to promote wound healing.  No meal intakes have been recorded within the past week.   No new weight has been obtained this admission.   Admission Weight: 112.5 kg - appears stated Current Weight: 110.2 kg (10/23)  Nutrition Related Medications: Vitamin C  1000 mg, Vitamin D3 2000 units, Colace, Ferrous Sulfate , Folic  Acid, NovoLog  0-15 units TID + 2 units - TID, Lantus  10 units daily, MVI, Miralax   Labs: Vitamin D  17.2 (low) CBG: 103-228 mg/dL x 24 hrs   Diet Order:   Diet Order             Diet Carb Modified Room service appropriate? Yes  Diet effective now                  EDUCATION NEEDS:  Education needs have been addressed  Skin:  Skin Assessment: Skin Integrity Issues: Skin Integrity Issues:: Incisions Incisions: Closed surgical incision left leg  Last BM:  12/28  Height:  Ht Readings from Last 1 Encounters:  04/11/24 6' 2 (1.88 m)   Weight:  Wt Readings from Last 1 Encounters:  04/12/24 110.2 kg   Ideal Body Weight:  86.4 kg  BMI:  Body mass index is 31.19 kg/m.  Estimated Nutritional Needs:  Kcal:  2100-2300 Protein:  110-130 grams Fluid:  >/= 2 L   Nestora Glatter RD, LDN Registered Dietitian I Please see AMION for contact information

## 2024-04-20 NOTE — Progress Notes (Signed)
 " PROGRESS NOTE Dennis Wise    DOB: Dec 22, 1950, 73 y.o.  FMW:[id number]    Code Status: Full Code   DOA: 04/11/2024   LOS: 9  Brief hospital course   Dennis Wise is a 73 y.o. male with a PMH significant for CVA on aspirin /Plavix , anemia of chronic disease and iron  deficiency, chronic thrombocytopenia, CKD stage IIIa, HTN, HLD, T2DM, GAD, s/p left elbow ORIF 02/08/2024 and left THA 02/10/2024 who is admitted for left periprosthetic hip fracture. Ortho brought back to OR for fixation 12/23 and is recovering as expected with wound vac in place. Ortho surgery has been following for this PT/OT are working with him and recommending SNF.  This admission is complicated by his anemia and thrombocytopenia with splenomegaly. Hematology has been following for this.   Assessment & Plan   Acute left periprosthetic hip fracture: s/p ORIF left femur 12/23  - ortho following, no weightbearing in left leg for 8 weeks, had some issues with postop blood loss, Ortho following bleeding seems to have stabilized on 04/18/2024, SCDs for DVT prophylaxis for ongoing bleeding.  Continue PT OT.  Will require SNF.  Left olecranon fracture- s/p fixation - OT, PT - keep elevated - analgesia PRN   Acute on chronic anemia-  S/p 3 plateletes so far, last on 04/12/2024, 1 ffp on 04/12/2024 and then again on 04/13/2024, and 12 units of packed RBC this admission, last 2 units on 04/16/2024, thrombocytopenia presumed secondary to splenic sequestration, seen by hematology, per orthopedics wound VAC site stable, CT abdomen pelvis and left hip on 04/17/2024 does not show any unexplained blood or hematoma, continue to monitor CBC closely, hematology also requested to continue to monitor on 04/20/2024.  Thrombocytopenia secondary to splenomegaly  - Set platelet transfusion threshold at <50,000/?L unless active bleeding.  Stabilizing.  CKD stage IIIa Mild hyperkalemia: resolved Renal function relatively stable.  Continue to monitor.     History of CVA: Hold Plavix  for now, no further drop in H&H will resume Plavix  on 04/20/2024   Hypertension: BP well-controlled.  Continue losartan  and HCTZ.  Added Norvasc  for better control.   Hyperlipidemia: Continue atorvastatin .   Mild vascular neurocognitive disorder GAD: Stable, continue Lexapro  and Namenda .keep on  delirium precautions.   Class I Obesity w/ Body mass index is 31.19 kg/m.: Will benefit with PCP follow-up, weight loss,healthy lifestyle and outpatient eval  Type 2 diabetes: Poorly controlled hyperglycemia.PTA on glipizide metformin  Actos , holding p.o. meds.On SSI 0 to 15 units, add Semglee  10 units and novolog  2 u premeal   Lab Results  Component Value Date   HGBA1C 5.6 03/24/2024   CBG (last 3)  Recent Labs    04/19/24 0747 04/19/24 1124 04/19/24 1556  GLUCAP 228* 182* 103*     Body mass index is 31.19 kg/m.  VTE ppx: SCDs Start: 04/12/24 2251  Diet:     Diet   Diet Carb Modified Room service appropriate? Yes   Family communication.  Wife Nena [phone] on 04/18/2024.    Consultants: Ortho surgery  Heme/onc  Subjective 04/20/2024    Patient in bed, appears comfortable, denies any headache, no fever, no chest pain or pressure, no shortness of breath , no abdominal pain. No focal weakness.  Objective   Blood pressure (!) 159/53, pulse 70, temperature 98.6 F (37 C), temperature source Oral, resp. rate 11, height 6' 2 (1.88 m), weight 110.2 kg, SpO2 99%.  Intake/Output Summary (Last 24 hours) at 04/20/2024 0908 Last data filed at 04/20/2024 0800 Gross  per 24 hour  Intake --  Output 1750 ml  Net -1750 ml   Filed Weights   04/11/24 2034 04/12/24 1143  Weight: 122.5 kg 110.2 kg    Physical Exam:   Awake Alert, No new F.N deficits, Normal affect [place].AT,PERRAL Supple Neck, No JVD,   Symmetrical Chest wall movement, Good air movement bilaterally, CTAB RRR,No Gallops, Rubs or new Murmurs,  +ve B.Sounds, Abd Soft, No  tenderness,   Left leg postop site appears stable, wound VAC has been removed   Inpatient Medications  Scheduled Meds:  amLODipine   10 mg Oral Daily   vitamin C   1,000 mg Oral Daily   atorvastatin   10 mg Oral QHS   cholecalciferol   2,000 Units Oral BID   docusate sodium   200 mg Oral BID   escitalopram   10 mg Oral Daily   ferrous sulfate   325 mg Oral BID WC   folic acid   1 mg Oral Daily   insulin  aspart  0-15 Units Subcutaneous TID WC   insulin  aspart  2 Units Subcutaneous TID WC   insulin  glargine  10 Units Subcutaneous Daily   [START ON 04/21/2024] isosorbide  mononitrate  60 mg Oral Daily   losartan   100 mg Oral Daily   memantine   5 mg Oral BID   multivitamin with minerals  1 tablet Oral Daily   nutrition supplement (JUVEN)  1 packet Oral BID BM   polyethylene glycol  17 g Oral BID   Ensure Max Protein  11 oz Oral BID BM   tamsulosin   0.4 mg Oral QHS   Continuous Infusions: PRN Meds:.acetaminophen , HYDROcodone -acetaminophen , ondansetron  (ZOFRAN ) IV  DVT Prophylaxis  SCDs Start: 04/12/24 2251   Recent Labs  Lab 04/16/24 0607 04/17/24 0534 04/18/24 0436 04/19/24 0348 04/20/24 0331  WBC 6.4 5.5 5.1 4.8 4.5  HGB 7.2* 7.9* 9.2* 8.5* 7.7*  HCT 20.5* 22.4* 26.4* 25.2* 22.9*  PLT 75* 80* 96* 106* 118*  MCV 88.4 88.2 88.6 87.8 87.1  MCH 31.0 31.1 30.9 29.6 29.3  MCHC 35.1 35.3 34.8 33.7 33.6  RDW 16.1* 15.8* 15.2 14.7 14.8  LYMPHSABS 0.8 0.7 0.9 0.7 0.8  MONOABS 0.6 0.7 0.7 0.6 0.5  EOSABS 0.1 0.3 0.4 0.2 0.2  BASOSABS 0.0 0.0 0.0 0.0 0.0    Recent Labs  Lab 04/14/24 0223 04/15/24 0540 04/16/24 0607 04/17/24 0534 04/17/24 1301 04/18/24 0436 04/19/24 0348 04/20/24 0331  NA 136   < > 134* 134*  --  136 135 137  K 4.3   < > 4.3 4.1  --  4.1 4.1 4.2  CL 102   < > 101 101  --  102 100 102  CO2 25   < > 24 24  --  25 25 26   ANIONGAP 9   < > 9 9  --  9 10 9   GLUCOSE 157*   < > 178* 210*  --  127* 275* 151*  BUN 49*   < > 65* 64*  --  62* 63* 64*  CREATININE  1.93*   < > 1.77* 1.53*  --  1.35* 1.35* 1.40*  INR 1.1  --   --   --  1.1  --   --   --   MG  --   --  1.9 1.8  --  1.8 1.7  --   PHOS  --   --  3.2 2.6  --  2.5 2.5  --   CALCIUM  7.9*   < > 8.2* 8.1*  --  8.9 8.6* 8.6*   < > = values in this interval not displayed.      Recent Labs  Lab 04/14/24 0223 04/15/24 0540 04/16/24 0607 04/17/24 0534 04/17/24 1301 04/18/24 0436 04/19/24 0348 04/20/24 0331  INR 1.1  --   --   --  1.1  --   --   --   MG  --   --  1.9 1.8  --  1.8 1.7  --   CALCIUM  7.9*   < > 8.2* 8.1*  --  8.9 8.6* 8.6*   < > = values in this interval not displayed.    --------------------------------------------------------------------------------------------------------------- Lab Results  Component Value Date   CHOL 165 02/19/2023   HDL 97.00 02/19/2023   LDLCALC 53 02/19/2023   LDLDIRECT 74.0 10/16/2015   TRIG 77.0 02/19/2023   CHOLHDL 2 02/19/2023    Lab Results  Component Value Date   HGBA1C 5.6 03/24/2024   No results for input(s): TSH, T4TOTAL, FREET4, T3FREE, THYROIDAB in the last 72 hours. No results for input(s): VITAMINB12, FOLATE, FERRITIN, TIBC, IRON , RETICCTPCT in the last 72 hours. ------------------------------------------------------------------------------------------------------------------ Cardiac Enzymes No results for input(s): CKMB, TROPONINI, MYOGLOBIN in the last 168 hours.  Invalid input(s): CK  Micro Results No results found for this or any previous visit (from the past 240 hours).  Radiology Reports  No results found.     Signature  -   Lavada Stank M.D on 04/20/2024 at 9:08 AM   -  To page go to [email or web address]       "

## 2024-04-20 NOTE — Plan of Care (Signed)
 Patient reported that he is in excruciating pain of the left hip and oral Norco is not helping him requesting for  increase the dose.   Adding IV morphine  2 mg every 4 hour as needed for severe pain control.    Patient also having frequent episodes of loose stool.  Adding loperamide as needed.  Hemodynamically stable.  Afebrile.  Shametra Cumberland, MD [facility] 04/20/2024, 10:25 PM     Update, heart rate is around 50.  Will discontinue IV morphine  as it is not not improving his pain at all.  Giving IV Tylenol  1000 mg.  Will avoid further narcotics.

## 2024-04-20 NOTE — Plan of Care (Signed)
   Problem: Education: Goal: Knowledge of General Education information will improve Description: Including pain rating scale, medication(s)/side effects and non-pharmacologic comfort measures Outcome: Progressing   Problem: Clinical Measurements: Goal: Diagnostic test results will improve Outcome: Progressing   Problem: Nutrition: Goal: Adequate nutrition will be maintained Outcome: Progressing

## 2024-04-20 NOTE — Progress Notes (Signed)
 Dennis Wise   DOB:1951/03/08   FM#:[id number]      CLINICAL SUMMARY:  Dennis Wise is a 73 year old male patient who presented on 04/11/2024 with left hip pain after a fall at home.  He is status post left femur repair and is having post-op bleeding.  Hematologic history is significant for anemia for which he follows with Dr. Babara. Heme-Onc now following.   INTERVAL HISTORY: -- Chronic anemia previously evaluated with no definitive cause found.  -- Acute likely secondary to post-op bleed, patient had wound vac post surgery, now removed. Also with left gluteal hematoma.  -- Status post PRBC transfusions this admission.   ASSESSMENT & PLAN:  Acute on Chronic Anemia History of iron  deficiency anemia -- Hemoglobin stable 7.7 today, although somewhat decreased since yesterday. Lower range MCV.  -- Recommend PRBC transfusion for HGB <7.0, if symptomatic may transfuse for <8.0.  -- Continue to monitor CBC with differential -- Primary Heme/Dr. Babara, recommend outpatient heme follow up upon discharge.  Dr. Tina covering.  Thrombocytopenia -- Likely secondary to splenomegaly.  -- Platelets improving, 118K today.  -- He is status post platelet transfusions this admission.  -- Recommend transfusion for counts <20K or <50K with active bleeding. -- No transfusional intervention required at this time -- Continue to monitor CBC with differential  Left hip fracture -- Status post fixation 04/12/24, Ortho following -- PT/OT following -- For discharge to SNF when appropriate   Hard of hearing -- Supportive care   Code Status Full   Subjective:  Patient seen laying supine in bed. Wife at bedside.  Admits to ongoing lower extremity pain and cannot get comfortable in the bed. Denies shortness of breath or chest pain. No bleeding noted. He is very hard of hearing.   Objective:   Intake/Output Summary (Last 24 hours) at 04/20/2024 1258 Last data filed at 04/20/2024 1207 Gross per 24 hour  Intake  --  Output 2250 ml  Net -2250 ml     PHYSICAL EXAMINATION: ECOG PERFORMANCE STATUS: 3 - Symptomatic, >50% confined to bed  Vitals:   04/20/24 0800 04/20/24 1200  BP: (!) 171/57 (!) 143/51  Pulse: (!) 49 (!) 48  Resp: 17 18  Temp: 98 F (36.7 C) 98.3 F (36.8 C)  SpO2: 90% (!) 89%   Filed Weights   04/11/24 2034 04/12/24 1143  Weight: 270 lb (122.5 kg) 242 lb 15.2 oz (110.2 kg)    GENERAL: alert, no distress and comfortable SKIN: skin color, texture, turgor are normal, no rashes or significant lesions EYES: normal, conjunctiva are pink and non-injected, sclera clear OROPHARYNX: no exudate, no erythema and lips, buccal mucosa, and tongue normal  NECK: supple, thyroid  normal size, non-tender, without nodularity LYMPH: no palpable lymphadenopathy in the cervical, axillary or inguinal LUNGS: clear to auscultation and percussion with normal breathing effort HEART: regular rate & rhythm and no murmurs and no lower extremity edema ABDOMEN: abdomen soft, non-tender and normal bowel sounds MUSCULOSKELETAL: no cyanosis of digits and no clubbing  PSYCH: alert & oriented x 3 with fluent speech NEURO: no focal motor/sensory deficits   All questions were answered. The patient knows to call the clinic with any problems, questions or concerns.   I personally spent a total of 40 minutes minutes in the care of the patient today including preparing to see the patient, performing a medically appropriate exam/evaluation, referring and communicating with other health care professionals, documenting clinical information in the EHR, communicating results, and coordinating care.  Olam PARAS Dennis Catanzaro, NP 04/20/2024 12:58 PM    Labs Reviewed:  Lab Results  Component Value Date   WBC 4.5 04/20/2024   HGB 7.8 (L) 04/20/2024   HCT 22.6 (L) 04/20/2024   MCV 86.9 04/20/2024   PLT 118 (L) 04/20/2024   Recent Labs    02/10/24 0549 02/11/24 0749 02/12/24 0552 03/24/24 1514 04/11/24 2044  04/18/24 0436 04/19/24 0348 04/20/24 0331  NA 137 136   < > 139   < > 136 135 137  K 4.6 4.6   < > 4.5   < > 4.1 4.1 4.2  CL 102 103   < > 103   < > 102 100 102  CO2 24 25   < > 27   < > 25 25 26   GLUCOSE 208* 231*   < > 129*   < > 127* 275* 151*  BUN 51* 42*   < > 31*   < > 62* 63* 64*  CREATININE 1.59* 1.39*   < > 1.22   < > 1.35* 1.35* 1.40*  CALCIUM  8.7* 8.5*   < > 9.1   < > 8.9 8.6* 8.6*  GFRNONAA 45* 53*   < >  --    < > 55* 55* 52*  PROT 5.5* 5.5*  --  6.4  --   --   --   --   ALBUMIN  3.3* 3.1*  --  3.9  --   --   --   --   AST 16 24  --  15  --   --   --   --   ALT 6 <5  --  9  --   --   --   --   ALKPHOS 102 95  --  137*  --   --   --   --   BILITOT 0.6 0.4  --  0.6  --   --   --   --    < > = values in this interval not displayed.    Studies Reviewed:   CT ABDOMEN PELVIS WO CONTRAST Result Date: 04/17/2024 CLINICAL DATA:  Abdominal pain. Decreased hematocrit. Recent left hip arthroplasty. EXAM: CT ABDOMEN AND PELVIS WITHOUT CONTRAST TECHNIQUE: Multidetector CT imaging of the abdomen and pelvis was performed following the standard protocol without IV contrast. RADIATION DOSE REDUCTION: This exam was performed according to the departmental dose-optimization program which includes automated exposure control, adjustment of the mA and/or kV according to patient size and/or use of iterative reconstruction technique. COMPARISON:  03/19/2022 FINDINGS: Lower chest: New tiny bilateral pleural effusions. Hepatobiliary: No mass visualized on this unenhanced exam. Small gallstones are seen, but without signs of cholecystitis or biliary dilatation. Pancreas: No mass or inflammatory process visualized on this unenhanced exam. Spleen:  Within normal limits in size. Adrenals/Urinary tract: No evidence of urolithiasis or hydronephrosis. Unremarkable unopacified urinary bladder. Stomach/Bowel: No evidence of obstruction, inflammatory process, or abnormal fluid collections. Vascular/Lymphatic: No  pathologically enlarged lymph nodes identified. No evidence of abdominal aortic aneurysm. Reproductive:  No mass or other significant abnormality. Other: No No evidence of retroperitoneal hemorrhage. Increased diffuse body wall edema. Musculoskeletal: No suspicious bone lesions identified. New left hip prosthesis in expected position. No evidence of left pelvic or hip hematoma. IMPRESSION: No evidence of acute abdominal or pelvic hemorrhage/hematoma. Increased diffuse body wall edema and new tiny bilateral pleural effusions, consistent with 3rd spacing. Cholelithiasis. No radiographic evidence of cholecystitis. Electronically Signed   By: Norleen DELENA Graham CHRISTELLA.D.  On: 04/17/2024 12:17   CT HIP LEFT WO CONTRAST Result Date: 04/17/2024 CLINICAL DATA:  Status post fixation of a periprosthetic left femur fracture. Recurrent anemia. Concern for hematoma. EXAM: CT OF THE LEFT HIP WITHOUT CONTRAST TECHNIQUE: Multidetector CT imaging of the left hip was performed according to the standard protocol. Multiplanar CT image reconstructions were also generated. RADIATION DOSE REDUCTION: This exam was performed according to the departmental dose-optimization program which includes automated exposure control, adjustment of the mA and/or kV according to patient size and/or use of iterative reconstruction technique. COMPARISON:  CT and radiographs of the left femur dated 04/12/2024 FINDINGS: Bones/Joint/Cartilage Status post lateral cortical plate and screw fixation of an oblique periprosthetic fracture of the proximal to mid femoral shaft surrounding the distal third of the femoral stem with improved alignment. The distal aspect of the fixation construct and femur is not included within the field of view. Left total hip arthroplasty demonstrates normal alignment with cerclage wire fixation. Ligaments Ligaments are suboptimally evaluated by CT. Soft tissue and Muscles Streak artifact from indwelling orthopedic hardware within the left  hip and femur limits detailed evaluation of the surrounding soft tissues. Fullness and edema of the visualized anterior compartment musculature of the thigh, predominantly involving the quadriceps musculature, appears similar to slightly increased compared to the prior exam. No discrete intramuscular collection, although, underlying intramuscular hematoma can not be entirely excluded. Postsurgical changes along the left lateral hip with surrounding subcutaneous soft tissue edema and ill-defined fluid. No loculated fluid collection. Calcification of the left common femoral artery and its major branches. IMPRESSION: 1. Streak artifact from indwelling orthopedic hardware within the left hip and femur limits detailed evaluation of the surrounding soft tissues. Fullness and edema of the visualized anterior compartment musculature of the thigh, predominantly involving the quadriceps musculature, appears similar to slightly increased compared to the prior exam. No discrete intramuscular collection, although, underlying intramuscular hematoma can not be entirely excluded. 2. Postsurgical changes along the left lateral hip with surrounding subcutaneous soft tissue edema and ill-defined fluid. No loculated fluid collection. 3. Status post lateral cortical plate and screw fixation of a left femoral shaft periprosthetic fracture with improved alignment. Left hip arthroplasty demonstrates normal alignment. Electronically Signed   By: Harrietta Sherry M.D.   On: 04/17/2024 11:47   DG FEMUR PORT MIN 2 VIEWS LEFT Result Date: 04/12/2024 CLINICAL DATA:  Fracture, postop. EXAM: LEFT FEMUR PORTABLE 2 VIEWS COMPARISON:  Preoperative imaging FINDINGS: Lateral plate and screw fixation of periprosthetic femur fracture. Decreased fracture displacement from preoperative imaging. Previous hip arthroplasty with cerclage wire fixation. Previous hardware within the patella and proximal tibia. Recent postsurgical change includes air and  edema in the soft tissues. IMPRESSION: ORIF of periprosthetic femur fracture. Decreased fracture displacement from preoperative imaging. Electronically Signed   By: Andrea Gasman M.D.   On: 04/12/2024 20:36   DG FEMUR MIN 2 VIEWS LEFT Result Date: 04/12/2024 CLINICAL DATA:  Elective surgery. EXAM: LEFT FEMUR 2 VIEWS COMPARISON:  Preoperative imaging FINDINGS: Twelve fluoroscopic spot views of the left femur submitted from the operating room. Plate and screw fixation of periprosthetic fracture adjacent to femoral stem of hip arthroplasty. Previous surgical fixation of the patella and proximal tibia. Fluoroscopy time 51.5 seconds. Dose 9.84 mGy. IMPRESSION: Intraoperative fluoroscopy during ORIF of femur fracture. Electronically Signed   By: Andrea Gasman M.D.   On: 04/12/2024 20:35   DG C-Arm 1-60 Min-No Report Result Date: 04/12/2024 Fluoroscopy was utilized by the requesting physician.  No radiographic interpretation.  DG C-Arm 1-60 Min-No Report Result Date: 04/12/2024 Fluoroscopy was utilized by the requesting physician.  No radiographic interpretation.   DG C-Arm 1-60 Min-No Report Result Date: 04/12/2024 Fluoroscopy was utilized by the requesting physician.  No radiographic interpretation.   DG C-Arm 1-60 Min-No Report Result Date: 04/12/2024 Fluoroscopy was utilized by the requesting physician.  No radiographic interpretation.   CT FEMUR LEFT WO CONTRAST Result Date: 04/12/2024 EXAM: CT OF THE LEFT FEMUR, WITHOUT IV CONTRAST 04/12/2024 06:01:52 AM TECHNIQUE: Axial images were acquired through the left femur without IV contrast. Reformatted images were reviewed. Automated exposure control, iterative reconstruction, and/or weight based adjustment of the mA/kV was utilized to reduce the radiation dose to as low as reasonably achievable. COMPARISON: Left hip and left knee films from yesterday. CLINICAL HISTORY: Fracture, femur Fracture, femur FINDINGS: BONES: There is spray artifact  from a left hip replacement with cerclage wiring, and lateral side plate ORIF hardware in the proximal tibia as well as cerclage wiring with healed fracture of the patella. There is an acute spiral oblique perihardware proximal to mid femoral shaft fracture at the level of the distal third of the femoral stem. There is a sizable butterfly combination fragment posteriorly at the level of the fracture which is displaced posteriorly by about 2 widths of the cortex. The main distal fracture fragment is displaced anteriorly and laterally by one third of the shaft's width and is also rotated laterally, almost at a right angle to the proximal fragment. Bridging osteophytes anterior left SI joint. The visualized portions of the left hemipelvis are unremarkable. There is no further evidence of fractures. JOINTS: No dislocation. There is a low density suprapatellar bursal effusion at the knee. There is mild arthrosis of the knee. No hip joint effusion. SOFT TISSUES: Although not well seen due to metal artifact, there is suspected to be some swelling in the anterior thigh compartment muscles. Underlying hematoma is not excluded. There are moderate calcified plaques in the left common femoral and superficial femoral arteries and in the popliteal and proximal popliteal trifurcation arteries. IMPRESSION: 1. Acute spiral oblique perihardware proximal to mid femoral shaft fracture at the level of the distal third of the femoral stem, with a sizable posterior butterfly fragment displaced posteriorly by about 2 cortex widths, and with the main distal fragment displaced anteriorly and laterally by one third shaft width and rotated laterally nearly 90 degrees relative to the proximal fragment. 2. Suspected anterior thigh compartment muscle swelling, with underlying hematoma not excluded. 3. Suprapatellar bursal effusion at the knee, without hip joint effusion. Electronically signed by: Francis Quam MD 04/12/2024 06:24 AM EST RP  Workstation: [id number]   DG Knee Complete 4 Views Left Result Date: 04/11/2024 CLINICAL DATA:  Status post fall with left hip pain. EXAM: LEFT KNEE - COMPLETE 4+ VIEW COMPARISON:  None Available. FINDINGS: Periprosthetic fracture about the femoral stem of hip arthroplasty, partially included in the field of view. Evaluation of the knee is limited due to difficulties with positioning. No obvious additional fracture. Two K-wires traverse the patella, 1 of which is broken. Plate and screw fixation of the proximal lateral tibia. No joint effusion. Mild generalized soft tissue edema. IMPRESSION: 1. Periprosthetic fracture about the femoral stem of hip arthroplasty, partially included. 2. Surgical fixation of the patella, 1 of the K-wires is broken. 3. Allowing for difficulty in limitations with positioning, no additional acute fracture of the knee. Electronically Signed   By: Andrea Gasman M.D.   On: 04/11/2024 21:38   DG  Hip Unilat W or Wo Pelvis 2-3 Views Left Result Date: 04/11/2024 CLINICAL DATA:  Status post fall, left hip pain. EXAM: DG HIP (WITH OR WITHOUT PELVIS) 2-3V LEFT COMPARISON:  None Available. FINDINGS: Left hip arthroplasty in place. Mildly displaced oblique/spiral fracture about the femoral stem. The femoral component is seated in the acetabular component. No pelvic fracture. Pubic rami are intact. IMPRESSION: Mildly displaced oblique/spiral periprosthetic fracture about the femoral stem. Electronically Signed   By: Andrea Gasman M.D.   On: 04/11/2024 21:36

## 2024-04-20 NOTE — TOC Progression Note (Addendum)
 Transition of Care [facility]) - Progression Note    Patient Details  Name: Dennis Wise MRN: [id number] Date of Birth: 07/12/1950  Transition of Care [facility]) CM/SW Contact  Inocente GORMAN Kindle, LCSW Phone Number: 04/20/2024, 12:19 PM  Clinical Narrative:    CSW updated MD and patient's spouse that [name] can accept patient on Friday pending insurance approval.   CSW initiated insurance process, Ref# [id number].     Expected Discharge Plan: Skilled Nursing Facility Barriers to Discharge: Insurance Authorization               Expected Discharge Plan and Services In-house Referral: Clinical Social Work   Post Acute Care Choice: Skilled Nursing Facility Living arrangements for the past 2 months: Single Family Home                                       Social Drivers of Health (SDOH) Interventions SDOH Screenings   Food Insecurity: No Food Insecurity (04/13/2024)  Housing: Unknown (04/13/2024)  Transportation Needs: No Transportation Needs (04/13/2024)  Utilities: Patient Declined (04/13/2024)  Alcohol  Screen: Low Risk (05/05/2023)  Depression (PHQ2-9): Low Risk (03/24/2024)  Financial Resource Strain: Low Risk (02/03/2024)  Physical Activity: Inactive (02/03/2024)  Social Connections: Moderately Isolated (04/13/2024)  Stress: No Stress Concern Present (02/03/2024)  Tobacco Use: Low Risk (04/12/2024)  Health Literacy: Adequate Health Literacy (05/05/2023)    Readmission Risk Interventions     No data to display

## 2024-04-21 DIAGNOSIS — Z96649 Presence of unspecified artificial hip joint: Secondary | ICD-10-CM | POA: Diagnosis not present

## 2024-04-21 DIAGNOSIS — M978XXA Periprosthetic fracture around other internal prosthetic joint, initial encounter: Secondary | ICD-10-CM | POA: Diagnosis not present

## 2024-04-21 LAB — COMPREHENSIVE METABOLIC PANEL WITH GFR
ALT: 16 U/L (ref 0–44)
AST: 21 U/L (ref 15–41)
Albumin: 2.9 g/dL — ABNORMAL LOW (ref 3.5–5.0)
Alkaline Phosphatase: 132 U/L — ABNORMAL HIGH (ref 38–126)
Anion gap: 10 (ref 5–15)
BUN: 60 mg/dL — ABNORMAL HIGH (ref 8–23)
CO2: 26 mmol/L (ref 22–32)
Calcium: 8.5 mg/dL — ABNORMAL LOW (ref 8.9–10.3)
Chloride: 102 mmol/L (ref 98–111)
Creatinine, Ser: 1.41 mg/dL — ABNORMAL HIGH (ref 0.61–1.24)
GFR, Estimated: 52 mL/min — ABNORMAL LOW
Glucose, Bld: 201 mg/dL — ABNORMAL HIGH (ref 70–99)
Potassium: 4.2 mmol/L (ref 3.5–5.1)
Sodium: 138 mmol/L (ref 135–145)
Total Bilirubin: 0.7 mg/dL (ref 0.0–1.2)
Total Protein: 5.2 g/dL — ABNORMAL LOW (ref 6.5–8.1)

## 2024-04-21 LAB — CBC
HCT: 22.4 % — ABNORMAL LOW (ref 39.0–52.0)
Hemoglobin: 7.6 g/dL — ABNORMAL LOW (ref 13.0–17.0)
MCH: 29.5 pg (ref 26.0–34.0)
MCHC: 33.9 g/dL (ref 30.0–36.0)
MCV: 86.8 fL (ref 80.0–100.0)
Platelets: 130 K/uL — ABNORMAL LOW (ref 150–400)
RBC: 2.58 MIL/uL — ABNORMAL LOW (ref 4.22–5.81)
RDW: 14.8 % (ref 11.5–15.5)
WBC: 4.8 K/uL (ref 4.0–10.5)
nRBC: 0 % (ref 0.0–0.2)

## 2024-04-21 LAB — GLUCOSE, CAPILLARY
Glucose-Capillary: 220 mg/dL — ABNORMAL HIGH (ref 70–99)
Glucose-Capillary: 201 mg/dL — ABNORMAL HIGH (ref 70–99)
Glucose-Capillary: 143 mg/dL — ABNORMAL HIGH (ref 70–99)

## 2024-04-21 LAB — HEMOGLOBIN AND HEMATOCRIT, BLOOD
HCT: 26.2 % — ABNORMAL LOW (ref 39.0–52.0)
Hemoglobin: 8.8 g/dL — ABNORMAL LOW (ref 13.0–17.0)

## 2024-04-21 LAB — PREPARE RBC (CROSSMATCH)

## 2024-04-21 MED ORDER — CARVEDILOL 3.125 MG PO TABS
3.1250 mg | ORAL_TABLET | Freq: Two times a day (BID) | ORAL | Status: DC
  Administered 2024-04-21 – 2024-04-23 (×5): 3.125 mg via ORAL
  Filled 2024-04-21 (×5): qty 1

## 2024-04-21 MED ORDER — CLOPIDOGREL BISULFATE 75 MG PO TABS
75.0000 mg | ORAL_TABLET | Freq: Every day | ORAL | Status: DC
  Administered 2024-04-21 – 2024-04-23 (×3): 75 mg via ORAL
  Filled 2024-04-21 (×3): qty 1

## 2024-04-21 MED ORDER — ACETAMINOPHEN 10 MG/ML IV SOLN
1000.0000 mg | INTRAVENOUS | Status: AC
  Administered 2024-04-21: 1000 mg via INTRAVENOUS
  Filled 2024-04-21: qty 100

## 2024-04-21 MED ORDER — FENTANYL CITRATE (PF) 50 MCG/ML IJ SOSY: 25.0000 ug | PREFILLED_SYRINGE | INTRAMUSCULAR | Status: DC

## 2024-04-21 MED ORDER — SODIUM CHLORIDE 0.9% IV SOLUTION: Freq: Once | INTRAVENOUS | Status: AC

## 2024-04-21 NOTE — Progress Notes (Signed)
 " PROGRESS NOTE Dennis Wise    DOB: 1951-04-14, 74 y.o.  FMW:[id number]    Code Status: Full Code   DOA: 04/11/2024   LOS: 10  Brief hospital course   Dennis Wise is a 74 y.o. male with a PMH significant for CVA on aspirin /Plavix , anemia of chronic disease and iron  deficiency, chronic thrombocytopenia, CKD stage IIIa, HTN, HLD, T2DM, GAD, s/p left elbow ORIF 02/08/2024 and left THA 02/10/2024 who is admitted for left periprosthetic hip fracture. Ortho brought back to OR for fixation 12/23 and is recovering as expected with wound vac in place. Ortho surgery has been following for this PT/OT are working with him and recommending SNF.  This admission is complicated by his anemia and thrombocytopenia with splenomegaly. Hematology has been following for this.   Assessment & Plan   Acute left periprosthetic hip fracture: s/p ORIF left femur 12/23  - ortho following, no weightbearing in left leg for 8 weeks, had some issues with postop blood loss, Ortho following bleeding seems to have stabilized on 04/18/2024, SCDs for DVT prophylaxis for ongoing bleeding.  Continue PT OT.  Will require SNF.  Left olecranon fracture- s/p fixation - OT, PT - keep elevated - analgesia PRN   Acute on chronic anemia-  S/p 3 plateletes so far, last on 04/12/2024, 1 ffp on 04/12/2024 and then again on 04/13/2024, and 12 units of packed RBC this admission, last 2 units on 04/16/2024, getting 1 more unit on 04/21/2024 although no signs of ongoing bleeding now, thrombocytopenia presumed secondary to splenic sequestration, seen by hematology, per orthopedics wound VAC site stable, CT abdomen pelvis and left hip on 04/17/2024 does not show any unexplained blood or hematoma, continue to monitor CBC closely, hematology also requested to continue to monitor on 04/20/2024.  Thrombocytopenia secondary to splenomegaly  - Set platelet transfusion threshold at <50,000/?L unless active bleeding.  Stabilizing.  CKD stage IIIa Mild  hyperkalemia: resolved Renal function relatively stable.  Continue to monitor.    History of CVA: Hold Plavix  for now, no further drop in H&H will resume Plavix  on 04/21/24   Hypertension: BP likely elevated on combination of Norvasc  and ARB, add Coreg on 04/21/2024 for better control.   Hyperlipidemia: Continue atorvastatin .   Mild vascular neurocognitive disorder GAD: Stable, continue Lexapro  and Namenda .keep on  delirium precautions.   Class I Obesity w/ Body mass index is 31.19 kg/m.: Will benefit with PCP follow-up, weight loss,healthy lifestyle and outpatient eval  Type 2 diabetes: Poorly controlled hyperglycemia.PTA on glipizide metformin  Actos , holding p.o. meds.On SSI 0 to 15 units, add Semglee  10 units and novolog  2 u premeal   Lab Results  Component Value Date   HGBA1C 5.6 03/24/2024   CBG (last 3)  Recent Labs    04/19/24 1556 04/20/24 1711 04/21/24 0711  GLUCAP 103* 232* 220*     Body mass index is 31.19 kg/m.  VTE ppx: SCDs Start: 04/12/24 2251  Diet:     Diet   Diet Carb Modified Room service appropriate? Yes   Family communication.  Wife Nena [phone] on 04/18/2024.    Consultants: Ortho surgery  Heme/onc  Subjective 04/21/2024    Patient in bed, appears comfortable, denies any headache, no fever, no chest pain or pressure, no shortness of breath , no abdominal pain. No focal weakness.  Objective   Blood pressure (!) 159/53, pulse 70, temperature 98.6 F (37 C), temperature source Oral, resp. rate 11, height 6' 2 (1.88 m), weight 110.2 kg, SpO2 99%.  Intake/Output Summary (Last 24 hours) at 04/21/2024 0942 Last data filed at 04/21/2024 0900 Gross per 24 hour  Intake --  Output 3200 ml  Net -3200 ml   Filed Weights   04/11/24 2034 04/12/24 1143  Weight: 122.5 kg 110.2 kg    Physical Exam:   Awake Alert, No new F.N deficits, Normal affect [place].AT,PERRAL Supple Neck, No JVD,   Symmetrical Chest wall movement, Good air movement  bilaterally, CTAB RRR,No Gallops, Rubs or new Murmurs,  +ve B.Sounds, Abd Soft, No tenderness,   Left leg postop site appears stable, wound VAC has been removed   Inpatient Medications  Scheduled Meds:  sodium chloride    Intravenous Once   amLODipine   10 mg Oral Daily   vitamin C   1,000 mg Oral Daily   atorvastatin   10 mg Oral QHS   cholecalciferol   2,000 Units Oral BID   docusate sodium   200 mg Oral BID   escitalopram   10 mg Oral Daily   ferrous sulfate   325 mg Oral BID WC   folic acid   1 mg Oral Daily   insulin  aspart  0-15 Units Subcutaneous TID WC   insulin  aspart  2 Units Subcutaneous TID WC   insulin  glargine  10 Units Subcutaneous Daily   isosorbide  mononitrate  60 mg Oral Daily   losartan   100 mg Oral Daily   memantine   5 mg Oral BID   multivitamin with minerals  1 tablet Oral Daily   nutrition supplement (JUVEN)  1 packet Oral BID BM   polyethylene glycol  17 g Oral BID   Ensure Max Protein  11 oz Oral BID BM   tamsulosin   0.4 mg Oral QHS   Continuous Infusions: PRN Meds:.acetaminophen , HYDROcodone -acetaminophen , loperamide, ondansetron  (ZOFRAN ) IV  DVT Prophylaxis  SCDs Start: 04/12/24 2251   Recent Labs  Lab 04/16/24 0607 04/17/24 0534 04/18/24 0436 04/19/24 0348 04/20/24 0331 04/20/24 1020 04/21/24 0328  WBC 6.4 5.5 5.1 4.8 4.5 4.5 4.8  HGB 7.2* 7.9* 9.2* 8.5* 7.7* 7.8* 7.6*  HCT 20.5* 22.4* 26.4* 25.2* 22.9* 22.6* 22.4*  PLT 75* 80* 96* 106* 118* 118* 130*  MCV 88.4 88.2 88.6 87.8 87.1 86.9 86.8  MCH 31.0 31.1 30.9 29.6 29.3 30.0 29.5  MCHC 35.1 35.3 34.8 33.7 33.6 34.5 33.9  RDW 16.1* 15.8* 15.2 14.7 14.8 14.9 14.8  LYMPHSABS 0.8 0.7 0.9 0.7 0.8  --   --   MONOABS 0.6 0.7 0.7 0.6 0.5  --   --   EOSABS 0.1 0.3 0.4 0.2 0.2  --   --   BASOSABS 0.0 0.0 0.0 0.0 0.0  --   --     Recent Labs  Lab 04/16/24 0607 04/17/24 0534 04/17/24 1301 04/18/24 0436 04/19/24 0348 04/20/24 0331 04/21/24 0328  NA 134* 134*  --  136 135 137 138  K 4.3 4.1   --  4.1 4.1 4.2 4.2  CL 101 101  --  102 100 102 102  CO2 24 24  --  25 25 26 26   ANIONGAP 9 9  --  9 10 9 10   GLUCOSE 178* 210*  --  127* 275* 151* 201*  BUN 65* 64*  --  62* 63* 64* 60*  CREATININE 1.77* 1.53*  --  1.35* 1.35* 1.40* 1.41*  AST  --   --   --   --   --   --  21  ALT  --   --   --   --   --   --  16  ALKPHOS  --   --   --   --   --   --  132*  BILITOT  --   --   --   --   --   --  0.7  ALBUMIN   --   --   --   --   --   --  2.9*  INR  --   --  1.1  --   --   --   --   MG 1.9 1.8  --  1.8 1.7  --   --   PHOS 3.2 2.6  --  2.5 2.5  --   --   CALCIUM  8.2* 8.1*  --  8.9 8.6* 8.6* 8.5*      Recent Labs  Lab 04/16/24 0607 04/17/24 0534 04/17/24 1301 04/18/24 0436 04/19/24 0348 04/20/24 0331 04/21/24 0328  INR  --   --  1.1  --   --   --   --   MG 1.9 1.8  --  1.8 1.7  --   --   CALCIUM  8.2* 8.1*  --  8.9 8.6* 8.6* 8.5*    --------------------------------------------------------------------------------------------------------------- Lab Results  Component Value Date   CHOL 165 02/19/2023   HDL 97.00 02/19/2023   LDLCALC 53 02/19/2023   LDLDIRECT 74.0 10/16/2015   TRIG 77.0 02/19/2023   CHOLHDL 2 02/19/2023    Lab Results  Component Value Date   HGBA1C 5.6 03/24/2024   No results for input(s): TSH, T4TOTAL, FREET4, T3FREE, THYROIDAB in the last 72 hours. No results for input(s): VITAMINB12, FOLATE, FERRITIN, TIBC, IRON , RETICCTPCT in the last 72 hours. ------------------------------------------------------------------------------------------------------------------ Cardiac Enzymes No results for input(s): CKMB, TROPONINI, MYOGLOBIN in the last 168 hours.  Invalid input(s): CK  Micro Results No results found for this or any previous visit (from the past 240 hours).  Radiology Reports  No results found.     Signature  -   Lavada Stank M.D on 04/21/2024 at 9:42 AM   -  To page go to [email or web address]       "

## 2024-04-22 DIAGNOSIS — M978XXA Periprosthetic fracture around other internal prosthetic joint, initial encounter: Secondary | ICD-10-CM | POA: Diagnosis not present

## 2024-04-22 DIAGNOSIS — Z96649 Presence of unspecified artificial hip joint: Secondary | ICD-10-CM | POA: Diagnosis not present

## 2024-04-22 LAB — CBC WITH DIFFERENTIAL/PLATELET
Abs Immature Granulocytes: 0.16 K/uL — ABNORMAL HIGH (ref 0.00–0.07)
Basophils Absolute: 0 K/uL (ref 0.0–0.1)
Basophils Relative: 1 %
Eosinophils Absolute: 0.2 K/uL (ref 0.0–0.5)
Eosinophils Relative: 3 %
HCT: 28.5 % — ABNORMAL LOW (ref 39.0–52.0)
Hemoglobin: 9.4 g/dL — ABNORMAL LOW (ref 13.0–17.0)
Immature Granulocytes: 2 %
Lymphocytes Relative: 14 %
Lymphs Abs: 1 K/uL (ref 0.7–4.0)
MCH: 29.5 pg (ref 26.0–34.0)
MCHC: 33 g/dL (ref 30.0–36.0)
MCV: 89.3 fL (ref 80.0–100.0)
Monocytes Absolute: 0.6 K/uL (ref 0.1–1.0)
Monocytes Relative: 9 %
Neutro Abs: 5.1 K/uL (ref 1.7–7.7)
Neutrophils Relative %: 71 %
Platelets: 153 K/uL (ref 150–400)
RBC: 3.19 MIL/uL — ABNORMAL LOW (ref 4.22–5.81)
RDW: 14.7 % (ref 11.5–15.5)
WBC: 7.1 K/uL (ref 4.0–10.5)
nRBC: 0 % (ref 0.0–0.2)

## 2024-04-22 LAB — TYPE AND SCREEN
ABO/RH(D): B POS
Antibody Screen: NEGATIVE
Unit division: 0

## 2024-04-22 LAB — BASIC METABOLIC PANEL WITH GFR
Anion gap: 10 (ref 5–15)
BUN: 52 mg/dL — ABNORMAL HIGH (ref 8–23)
CO2: 27 mmol/L (ref 22–32)
Calcium: 8.7 mg/dL — ABNORMAL LOW (ref 8.9–10.3)
Chloride: 101 mmol/L (ref 98–111)
Creatinine, Ser: 1.36 mg/dL — ABNORMAL HIGH (ref 0.61–1.24)
GFR, Estimated: 54 mL/min — ABNORMAL LOW
Glucose, Bld: 109 mg/dL — ABNORMAL HIGH (ref 70–99)
Potassium: 3.9 mmol/L (ref 3.5–5.1)
Sodium: 138 mmol/L (ref 135–145)

## 2024-04-22 LAB — GLUCOSE, CAPILLARY
Glucose-Capillary: 174 mg/dL — ABNORMAL HIGH (ref 70–99)
Glucose-Capillary: 171 mg/dL — ABNORMAL HIGH (ref 70–99)
Glucose-Capillary: 135 mg/dL — ABNORMAL HIGH (ref 70–99)

## 2024-04-22 LAB — BPAM RBC
Blood Product Expiration Date: 202601292359
ISSUE DATE / TIME: 202601011116
Unit Type and Rh: 7300

## 2024-04-22 MED ORDER — LOSARTAN POTASSIUM 50 MG PO TABS
100.0000 mg | ORAL_TABLET | Freq: Every day | ORAL | Status: DC
  Administered 2024-04-22 – 2024-04-23 (×2): 100 mg via ORAL
  Filled 2024-04-22 (×2): qty 2

## 2024-04-22 MED ORDER — ISOSORBIDE MONONITRATE ER 60 MG PO TB24
60.0000 mg | ORAL_TABLET | Freq: Every day | ORAL | Status: DC
  Administered 2024-04-22 – 2024-04-23 (×2): 60 mg via ORAL
  Filled 2024-04-22 (×2): qty 1

## 2024-04-22 MED ORDER — HEPARIN SODIUM (PORCINE) 5000 UNIT/ML IJ SOLN
5000.0000 [IU] | Freq: Three times a day (TID) | INTRAMUSCULAR | Status: DC
  Administered 2024-04-22 – 2024-04-23 (×3): 5000 [IU] via SUBCUTANEOUS
  Filled 2024-04-22 (×3): qty 1

## 2024-04-22 NOTE — Plan of Care (Signed)

## 2024-04-22 NOTE — Progress Notes (Signed)
 Physical Therapy Treatment Patient Details Name: Dennis Wise MRN: [id number] DOB: [birth year] Today's Date: 04/22/2024   History of Present Illness 74 yo M adm 12/22 due to fall with left periprosthetic femur fracture around long hip stem s/p ORIF L femur. PMH 01/2024 fall with L THA and ORIF L elbow, NAFLD, ischemc R PCA stroke (2024), HTN, DM2, hearing loss, C2-T2 fusion (12/2023).    PT Comments  Pt received in supine and agreeable to session. Pt requires increased cues for redirection and technique throughout session to maintain precautions. Pt appears more confused this session, stating his pee runs down to his socks despite purewick and that he sees a tv on the floor. Pt appears to have double vision on the L in addition to the field cut with pt stating he sees 4 fingers when 2 are held up, but answers accurately on the R. Pt able to perform BLE exercises sitting EOB, but defers lateral scoots due to need for BM. Pt continues to benefit from PT services to progress toward functional mobility goals.     If plan is discharge home, recommend the following: Two people to help with walking and/or transfers;Two people to help with bathing/dressing/bathroom   Can travel by private vehicle     No  Equipment Recommendations  Other (comment) (defer)    Recommendations for Other Services       Precautions / Restrictions Precautions Precautions: Fall;Posterior Hip Recall of Precautions/Restrictions: Impaired Precaution/Restrictions Comments: Visual deficits ( L field cut), wound vac Restrictions Weight Bearing Restrictions Per Provider Order: Yes LLE Weight Bearing Per Provider Order: Non weight bearing Other Position/Activity Restrictions: no WB or ROM restricitons with L elbow - cleared by [place] 12/24     Mobility  Bed Mobility Overal bed mobility: Needs Assistance Bed Mobility: Supine to Sit, Sit to Supine     Supine to sit: Max assist, +2 for physical assistance, Used rails,  HOB elevated Sit to supine: +2 for physical assistance, Max assist   General bed mobility comments: Exit to L EOB with step by step cues for sequencing. Pt able to grap rail with RUE and advance BLE to EOB with assist for LLE. Assist for trunk elevation via HHA, and scooting hips to EOB. Pt able to lean onto L elbow to return to supine, but requires assist for BLE elevation. Pt able to supine scoot towards HOB with RUE support on rail and RLE with bed in trendelenberg    Transfers                   General transfer comment: Pt declines attempt due to need for BM, so pt returned to supine    Ambulation/Gait                   Stairs             Wheelchair Mobility     Tilt Bed    Modified Rankin (Stroke Patients Only)       Balance Overall balance assessment: History of Falls, Needs assistance Sitting-balance support: Bilateral upper extremity supported, Feet supported, Single extremity supported Sitting balance-Leahy Scale: Fair Sitting balance - Comments: CGA sitting EOB. dynamic balance limited by pain                                    Communication Communication Communication: Impaired Factors Affecting Communication: Hearing impaired  Cognition Arousal: Alert Behavior  During Therapy: WFL for tasks assessed/performed   PT - Cognitive impairments: Awareness, Safety/Judgement, Problem solving, Sequencing                       PT - Cognition Comments: Pt tangential requiring redirection and increased cues for technique. Pt stating how he would do at task at home, but requires reminders of new precautions Following commands: Impaired Following commands impaired: Follows one step commands with increased time    Cueing Cueing Techniques: Verbal cues, Visual cues  Exercises General Exercises - Lower Extremity Long Arc Quad: AROM, Both, 10 reps, Seated Hip Flexion/Marching: AROM, Seated, Right, 10 reps    General Comments         Pertinent Vitals/Pain Pain Assessment Pain Assessment: Faces Faces Pain Scale: Hurts whole lot Pain Location: LLE with mobility Pain Descriptors / Indicators: Operative site guarding, Grimacing, Moaning Pain Intervention(s): Limited activity within patient's tolerance, Monitored during session, Repositioned     PT Goals (current goals can now be found in the care plan section) Acute Rehab PT Goals Patient Stated Goal: less pain PT Goal Formulation: With patient Time For Goal Achievement: 04/27/24 Progress towards PT goals: Progressing toward goals    Frequency    Min 2X/week       AM-PAC PT 6 Clicks Mobility   Outcome Measure  Help needed turning from your back to your side while in a flat bed without using bedrails?: Total Help needed moving from lying on your back to sitting on the side of a flat bed without using bedrails?: Total Help needed moving to and from a bed to a chair (including a wheelchair)?: Total Help needed standing up from a chair using your arms (e.g., wheelchair or bedside chair)?: Total Help needed to walk in hospital room?: Total Help needed climbing 3-5 steps with a railing? : Total 6 Click Score: 6    End of Session   Activity Tolerance: Patient limited by pain;Patient tolerated treatment well Patient left: in bed;with call bell/phone within reach;with bed alarm set;with family/visitor present Nurse Communication: Mobility status PT Visit Diagnosis: Pain;Other abnormalities of gait and mobility (R26.89);History of falling (Z91.81) Pain - Right/Left: Left Pain - part of body: Hip     Time: 8886-8860 PT Time Calculation (min) (ACUTE ONLY): 26 min  Charges:    $Therapeutic Exercise: 8-22 mins $Therapeutic Activity: 8-22 mins PT General Charges $$ ACUTE PT VISIT: 1 Visit                    [name], PTA Acute Rehabilitation Services Secure Chat Preferred  [phone]    [name] 04/22/2024, 12:51  PM

## 2024-04-22 NOTE — TOC Progression Note (Addendum)
 Transition of Care [facility]) - Progression Note    Patient Details  Name: Dennis Wise MRN: [id number] Date of Birth: 05-Sep-1950  Transition of [facility]) CM/SW Contact  Inocente GORMAN Kindle, LCSW Phone Number: 04/22/2024, 9:07 AM  Clinical Narrative:    9:07 AM-Insurance approval still pending for [name].   2:46 PM-Insurance approval received, Ref# [id number], Auth ID# [id number], effective 04/22/2024-04/27/2024. Updated facility and per MD, hopeful for medical stability tomorrow.    Expected Discharge Plan: Skilled Nursing Facility Barriers to Discharge: Insurance Authorization               Expected Discharge Plan and Services In-house Referral: Clinical Social Work   Post Acute Care Choice: Skilled Nursing Facility Living arrangements for the past 2 months: Single Family Home                                       Social Drivers of Health (SDOH) Interventions SDOH Screenings   Food Insecurity: No Food Insecurity (04/13/2024)  Housing: Unknown (04/13/2024)  Transportation Needs: No Transportation Needs (04/13/2024)  Utilities: Patient Declined (04/13/2024)  Alcohol  Screen: Low Risk (05/05/2023)  Depression (PHQ2-9): Low Risk (03/24/2024)  Financial Resource Strain: Low Risk (02/03/2024)  Physical Activity: Inactive (02/03/2024)  Social Connections: Moderately Isolated (04/13/2024)  Stress: No Stress Concern Present (02/03/2024)  Tobacco Use: Low Risk (04/12/2024)  Health Literacy: Adequate Health Literacy (05/05/2023)    Readmission Risk Interventions     No data to display

## 2024-04-22 NOTE — Progress Notes (Signed)
 " PROGRESS NOTE Dennis Wise    DOB: September 26, 1950, 74 y.o.  FMW:[id number]    Code Status: Full Code   DOA: 04/11/2024   LOS: 11  Brief hospital course   Dennis Wise is a 74 y.o. male with a PMH significant for CVA on aspirin /Plavix , anemia of chronic disease and iron  deficiency, chronic thrombocytopenia, CKD stage IIIa, HTN, HLD, T2DM, GAD, s/p left elbow ORIF 02/08/2024 and left THA 02/10/2024 who is admitted for left periprosthetic hip fracture. Ortho brought back to OR for fixation 12/23 and is recovering as expected with wound vac in place. Ortho surgery has been following for this PT/OT are working with him and recommending SNF.  This admission is complicated by his anemia and thrombocytopenia with splenomegaly. Hematology has been following for this.   Assessment & Plan   Acute left periprosthetic hip fracture: s/p ORIF left femur 12/23, seen by ortho following, no weightbearing in left leg for 8 weeks, had some issues with postop blood loss, Ortho following bleeding seems to have stabilized on 04/18/2024, SCDs for DVT prophylaxis for ongoing bleeding.  Continue PT OT.  Will require SNF.  H&H is now stable will start him on low-dose heparin  for DVT prophylaxis on 04/22/2024 and monitor.  Left olecranon fracture- s/p fixation,  OT, PT, keep elevated, analgesia PRN   Acute on chronic anemia-  S/p 3 plateletes so far, last on 04/12/2024, 1 ffp on 04/12/2024 and then again on 04/13/2024, and 12 units of packed RBC this admission, last 2 units on 04/16/2024, getting 1 more unit on 04/21/2024 although no signs of ongoing bleeding now, thrombocytopenia presumed secondary to splenic sequestration, seen by hematology, per orthopedics wound VAC site stable, CT abdomen pelvis and left hip on 04/17/2024 does not show any unexplained blood or hematoma, continue to monitor CBC closely, hematology also requested to continue to monitor on 04/20/2024.  Thrombocytopenia secondary to splenomegaly  - Set platelet  transfusion threshold at <50,000/?L unless active bleeding.  Stabilizing.  CKD stage IIIa, Mild hyperkalemia: resolved  History of CVA:  resume Plavix  on 04/21/24   Hypertension: BP likely elevated on combination of Norvasc  and ARB, add Coreg on 04/21/2024 for better control.   Hyperlipidemia: Continue atorvastatin .   Mild vascular neurocognitive disorder . GAD:  Stable, continue Lexapro  and Namenda .keep on  delirium precautions.   Class I Obesity w/ Body mass index is 31.19 kg/m. Will benefit with PCP follow-up, weight loss,healthy lifestyle and outpatient eval  Type 2 diabetes: Poorly controlled hyperglycemia.PTA on glipizide metformin  Actos , holding p.o. meds.On SSI 0 to 15 units, add Semglee  10 units and novolog  2 u premeal   Lab Results  Component Value Date   HGBA1C 5.6 03/24/2024   CBG (last 3)  Recent Labs    04/21/24 0711 04/21/24 1151 04/21/24 1724  GLUCAP 220* 201* 143*     Body mass index is 31.19 kg/m.  VTE ppx: SCDs Start: 04/12/24 2251  Diet:     Diet   Diet Carb Modified Room service appropriate? Yes   Family communication.  Wife Nena [phone] on 04/18/2024.    Consultants: Ortho surgery  Heme/onc  Subjective 04/22/2024    Patient in bed, appears comfortable, denies any headache, no fever, no chest pain or pressure, no shortness of breath , no abdominal pain. No new focal weakness.   Objective   Blood pressure (!) 159/53, pulse 70, temperature 98.6 F (37 C), temperature source Oral, resp. rate 11, height 6' 2 (1.88 m), weight 110.2 kg, SpO2  99%.  Intake/Output Summary (Last 24 hours) at 04/22/2024 0905 Last data filed at 04/22/2024 0524 Gross per 24 hour  Intake 378 ml  Output 2350 ml  Net -1972 ml   Filed Weights   04/11/24 2034 04/12/24 1143  Weight: 122.5 kg 110.2 kg    Physical Exam:   Awake Alert, No new F.N deficits, Normal affect [place].AT,PERRAL Supple Neck, No JVD,   Symmetrical Chest wall movement, Good air movement  bilaterally, CTAB RRR,No Gallops, Rubs or new Murmurs,  +ve B.Sounds, Abd Soft, No tenderness,   Left leg postop site appears stable, wound VAC has been removed   Inpatient Medications  Scheduled Meds:  amLODipine   10 mg Oral Daily   vitamin C   1,000 mg Oral Daily   atorvastatin   10 mg Oral QHS   carvedilol  3.125 mg Oral BID WC   cholecalciferol   2,000 Units Oral BID   clopidogrel   75 mg Oral Daily   docusate sodium   200 mg Oral BID   escitalopram   10 mg Oral Daily   ferrous sulfate   325 mg Oral BID WC   folic acid   1 mg Oral Daily   insulin  aspart  0-15 Units Subcutaneous TID WC   insulin  aspart  2 Units Subcutaneous TID WC   insulin  glargine  10 Units Subcutaneous Daily   isosorbide  mononitrate  60 mg Oral Daily   losartan   100 mg Oral Daily   memantine   5 mg Oral BID   multivitamin with minerals  1 tablet Oral Daily   nutrition supplement (JUVEN)  1 packet Oral BID BM   polyethylene glycol  17 g Oral BID   Ensure Max Protein  11 oz Oral BID BM   tamsulosin   0.4 mg Oral QHS   Continuous Infusions: PRN Meds:.acetaminophen , HYDROcodone -acetaminophen , loperamide, ondansetron  (ZOFRAN ) IV  DVT Prophylaxis  SCDs Start: 04/12/24 2251   Recent Labs  Lab 04/17/24 0534 04/18/24 0436 04/19/24 0348 04/20/24 0331 04/20/24 1020 04/21/24 0328 04/21/24 2139 04/22/24 0426  WBC 5.5 5.1 4.8 4.5 4.5 4.8  --  7.1  HGB 7.9* 9.2* 8.5* 7.7* 7.8* 7.6* 8.8* 9.4*  HCT 22.4* 26.4* 25.2* 22.9* 22.6* 22.4* 26.2* 28.5*  PLT 80* 96* 106* 118* 118* 130*  --  153  MCV 88.2 88.6 87.8 87.1 86.9 86.8  --  89.3  MCH 31.1 30.9 29.6 29.3 30.0 29.5  --  29.5  MCHC 35.3 34.8 33.7 33.6 34.5 33.9  --  33.0  RDW 15.8* 15.2 14.7 14.8 14.9 14.8  --  14.7  LYMPHSABS 0.7 0.9 0.7 0.8  --   --   --  1.0  MONOABS 0.7 0.7 0.6 0.5  --   --   --  0.6  EOSABS 0.3 0.4 0.2 0.2  --   --   --  0.2  BASOSABS 0.0 0.0 0.0 0.0  --   --   --  0.0    Recent Labs  Lab 04/16/24 0607 04/17/24 0534 04/17/24 1301  04/18/24 0436 04/19/24 0348 04/20/24 0331 04/21/24 0328 04/22/24 0426  NA 134* 134*  --  136 135 137 138 138  K 4.3 4.1  --  4.1 4.1 4.2 4.2 3.9  CL 101 101  --  102 100 102 102 101  CO2 24 24  --  25 25 26 26 27   ANIONGAP 9 9  --  9 10 9 10 10   GLUCOSE 178* 210*  --  127* 275* 151* 201* 109*  BUN 65* 64*  --  62* 63* 64* 60* 52*  CREATININE 1.77* 1.53*  --  1.35* 1.35* 1.40* 1.41* 1.36*  AST  --   --   --   --   --   --  21  --   ALT  --   --   --   --   --   --  16  --   ALKPHOS  --   --   --   --   --   --  132*  --   BILITOT  --   --   --   --   --   --  0.7  --   ALBUMIN   --   --   --   --   --   --  2.9*  --   INR  --   --  1.1  --   --   --   --   --   MG 1.9 1.8  --  1.8 1.7  --   --   --   PHOS 3.2 2.6  --  2.5 2.5  --   --   --   CALCIUM  8.2* 8.1*  --  8.9 8.6* 8.6* 8.5* 8.7*      Recent Labs  Lab 04/16/24 0607 04/17/24 0534 04/17/24 1301 04/18/24 0436 04/19/24 0348 04/20/24 0331 04/21/24 0328 04/22/24 0426  INR  --   --  1.1  --   --   --   --   --   MG 1.9 1.8  --  1.8 1.7  --   --   --   CALCIUM  8.2* 8.1*  --  8.9 8.6* 8.6* 8.5* 8.7*    --------------------------------------------------------------------------------------------------------------- Lab Results  Component Value Date   CHOL 165 02/19/2023   HDL 97.00 02/19/2023   LDLCALC 53 02/19/2023   LDLDIRECT 74.0 10/16/2015   TRIG 77.0 02/19/2023   CHOLHDL 2 02/19/2023    Lab Results  Component Value Date   HGBA1C 5.6 03/24/2024   No results for input(s): TSH, T4TOTAL, FREET4, T3FREE, THYROIDAB in the last 72 hours. No results for input(s): VITAMINB12, FOLATE, FERRITIN, TIBC, IRON , RETICCTPCT in the last 72 hours. ------------------------------------------------------------------------------------------------------------------ Cardiac Enzymes No results for input(s): CKMB, TROPONINI, MYOGLOBIN in the last 168 hours.  Invalid input(s): CK  Micro Results No  results found for this or any previous visit (from the past 240 hours).  Radiology Reports  No results found.   Signature  -   Lavada Stank M.D on 04/22/2024 at 9:05 AM   -  To page go to [email or web address]     "

## 2024-04-22 NOTE — Plan of Care (Signed)
" °  Problem: Fluid Volume: Goal: Ability to maintain a balanced intake and output will improve Outcome: Progressing   Problem: Nutritional: Goal: Maintenance of adequate nutrition will improve Outcome: Progressing   Problem: Skin Integrity: Goal: Risk for impaired skin integrity will decrease Outcome: Progressing   Problem: Clinical Measurements: Goal: Respiratory complications will improve Outcome: Progressing   Problem: Elimination: Goal: Will not experience complications related to bowel motility Outcome: Progressing Goal: Will not experience complications related to urinary retention Outcome: Progressing   Problem: Education: Goal: Ability to describe self-care measures that may prevent or decrease complications (Diabetes Survival Skills Education) will improve Outcome: Not Progressing   Problem: Coping: Goal: Ability to adjust to condition or change in health will improve Outcome: Not Progressing   Problem: Health Behavior/Discharge Planning: Goal: Ability to manage health-related needs will improve Outcome: Not Progressing   Problem: Metabolic: Goal: Ability to maintain appropriate glucose levels will improve Outcome: Not Progressing   Problem: Nutritional: Goal: Progress toward achieving an optimal weight will improve Outcome: Not Progressing   Problem: Activity: Goal: Risk for activity intolerance will decrease Outcome: Not Progressing   Problem: Pain Managment: Goal: General experience of comfort will improve and/or be controlled Outcome: Not Progressing   "

## 2024-04-23 DIAGNOSIS — Z96649 Presence of unspecified artificial hip joint: Secondary | ICD-10-CM | POA: Diagnosis not present

## 2024-04-23 DIAGNOSIS — M978XXA Periprosthetic fracture around other internal prosthetic joint, initial encounter: Secondary | ICD-10-CM | POA: Diagnosis not present

## 2024-04-23 LAB — CBC
HCT: 27.7 % — ABNORMAL LOW (ref 39.0–52.0)
Hemoglobin: 9.2 g/dL — ABNORMAL LOW (ref 13.0–17.0)
MCH: 29.8 pg (ref 26.0–34.0)
MCHC: 33.2 g/dL (ref 30.0–36.0)
MCV: 89.6 fL (ref 80.0–100.0)
Platelets: 157 K/uL (ref 150–400)
RBC: 3.09 MIL/uL — ABNORMAL LOW (ref 4.22–5.81)
RDW: 15.1 % (ref 11.5–15.5)
WBC: 8.5 K/uL (ref 4.0–10.5)
nRBC: 0 % (ref 0.0–0.2)

## 2024-04-23 LAB — GLUCOSE, CAPILLARY: Glucose-Capillary: 160 mg/dL — ABNORMAL HIGH (ref 70–99)

## 2024-04-23 MED ORDER — AMLODIPINE BESYLATE 10 MG PO TABS: 10.0000 mg | ORAL_TABLET | Freq: Every day | ORAL | Status: DC

## 2024-04-23 MED ORDER — ISOSORBIDE MONONITRATE ER 60 MG PO TB24: 60.0000 mg | ORAL_TABLET | Freq: Every day | ORAL | Status: DC

## 2024-04-23 MED ORDER — FERROUS SULFATE 325 (65 FE) MG PO TABS: 325.0000 mg | ORAL_TABLET | Freq: Every day | ORAL | Status: AC

## 2024-04-23 MED ORDER — POLYETHYLENE GLYCOL 3350 17 G PO PACK: 17.0000 g | PACK | Freq: Every day | ORAL | Status: DC

## 2024-04-23 MED ORDER — FOLIC ACID 1 MG PO TABS: 1.0000 mg | ORAL_TABLET | Freq: Every day | ORAL | Status: AC

## 2024-04-23 MED ORDER — INSULIN ASPART 100 UNIT/ML FLEXPEN: PEN_INJECTOR | SUBCUTANEOUS | Status: DC

## 2024-04-23 MED ORDER — ENOXAPARIN SODIUM 40 MG/0.4ML IJ SOSY: 40.0000 mg | PREFILLED_SYRINGE | INTRAMUSCULAR | Status: DC

## 2024-04-23 MED ORDER — JUVEN PO PACK: 1.0000 | PACK | Freq: Two times a day (BID) | ORAL | Status: DC

## 2024-04-23 MED ORDER — INSULIN GLARGINE 100 UNIT/ML ~~LOC~~ SOLN: 10.0000 [IU] | Freq: Every day | SUBCUTANEOUS | Status: DC

## 2024-04-23 MED ORDER — DOCUSATE SODIUM 100 MG PO CAPS: 200.0000 mg | ORAL_CAPSULE | Freq: Every day | ORAL | Status: DC | PRN

## 2024-04-23 MED ORDER — CARVEDILOL 3.125 MG PO TABS: 3.1250 mg | ORAL_TABLET | Freq: Two times a day (BID) | ORAL | Status: DC

## 2024-04-23 MED ORDER — FERROUS SULFATE 325 (65 FE) MG PO TABS: 325.0000 mg | ORAL_TABLET | Freq: Two times a day (BID) | ORAL | Status: DC

## 2024-04-23 NOTE — TOC Progression Note (Signed)
 Transition of Care [facility]) - Progression Note    Patient Details  Name: Dennis Wise MRN: [id number] Date of Birth: [birth year]  Transition of Care [facility] CM/SW Contact  [name], LCSW Phone Number: 04/23/2024, 8:32 AM  Clinical Narrative:   CSW confirmed with [name]: they can receive pt today.  MD notified.    Expected Discharge Plan: Skilled Nursing Facility Barriers to Discharge: Insurance Authorization               Expected Discharge Plan and Services In-house Referral: Clinical Social Work   Post Acute Care Choice: Skilled Nursing Facility Living arrangements for the past 2 months: Single Family Home                                       Social Drivers of Health (SDOH) Interventions SDOH Screenings   Food Insecurity: No Food Insecurity (04/13/2024)  Housing: Unknown (04/13/2024)  Transportation Needs: No Transportation Needs (04/13/2024)  Utilities: Patient Declined (04/13/2024)  Alcohol  Screen: Low Risk (05/05/2023)  Depression (PHQ2-9): Low Risk (03/24/2024)  Financial Resource Strain: Low Risk (02/03/2024)  Physical Activity: Inactive (02/03/2024)  Social Connections: Moderately Isolated (04/13/2024)  Stress: No Stress Concern Present (02/03/2024)  Tobacco Use: Low Risk (04/12/2024)  Health Literacy: Adequate Health Literacy (05/05/2023)    Readmission Risk Interventions     No data to display

## 2024-04-23 NOTE — Discharge Summary (Signed)
 "                                                                                                                                                                               Discharge summary note.  Dennis Wise FMW:[id number] DOB: October 11, 1950 DOA: 04/11/2024  PCP: [name], MD  Admit date: 04/11/2024  Discharge date: 04/23/2024  Admitted From: Home   Disposition:  SNF   Recommendations for Outpatient Follow-up:   Follow up with PCP in 1-2 weeks  PCP Please obtain BMP/CBC, 2 view CXR in 1week,  (see Discharge instructions)   PCP Please follow up on the following pending results:     Home Health: None   Equipment/Devices: None  Consultations: Ortho, Haem Discharge Condition: Stable     CODE STATUS: Full     Diet Recommendation: Heart Healthy  Low Carb - check CBGs QAC-HS  Chief Complaint  Patient presents with   Fall     Brief history of present illness from the day of admission and additional interim summary     74 y.o. male with a PMH significant for CVA on aspirin /Plavix , anemia of chronic disease and iron  deficiency, chronic thrombocytopenia, CKD stage IIIa, HTN, HLD, T2DM, GAD, s/p left elbow ORIF 02/08/2024 and left THA 02/10/2024 who is admitted for left periprosthetic hip fracture.  Ortho brought back to OR for fixation 12/23 and is recovering as expected with wound vac in place. Ortho surgery has been following for this.  PT/OT are working with him and recommending SNF.  This admission is complicated by his anemia and thrombocytopenia with splenomegaly. Hematology has been following for this.                                                                  Hospital Course   Acute left periprosthetic hip fracture: s/p ORIF left femur 12/23, seen by ortho following, no weightbearing in left leg for 8 weeks, had some issues with postop blood loss, Ortho following bleeding seems to have stabilized on 04/18/2024, SCDs for DVT prophylaxis for ongoing bleeding.   Continue PT OT.  Will require SNF.  H&H is now stable will start him on low-dose chemical DVT prophylaxis on 04/22/2024.  Continue prophylactic Lovenox  for another 10 days thereafter per orthopedics.  Patient must follow-up with orthopedics within a week of discharge.   Left olecranon fracture- s/p fixation,  OT, PT, keep elevated, analgesia PRN   Acute on chronic anemia-  S/p 3 plateletes so far, last on 04/12/2024, 1 ffp on 04/12/2024 and then again on 04/13/2024, and 12 units of packed RBC this admission, last 2 units on 04/16/2024, getting 1 more unit on 04/21/2024 although no signs of ongoing bleeding now, thrombocytopenia presumed secondary to splenic sequestration, seen by hematology, CT abdomen pelvis and left hip on 04/17/2024 does not show any unexplained blood or hematoma, continue to monitor CBC closely, hematology also note the patient.  Thankfully counts have not stabilized no signs of ongoing bleeding.  Monitor CBC intermittently at SNF.   Thrombocytopenia secondary to splenomegaly  - Set platelet transfusion threshold at <50,000/?L unless active bleeding.  Stabilizing.   CKD stage IIIa, Mild hyperkalemia: resolved   History of CVA:  resumed Plavix  on 04/21/24   Hypertension: BP likely elevated on combination of Norvasc  and ARB, add Coreg  on 04/21/2024 for better control.   Hyperlipidemia: Continue atorvastatin .   Mild vascular neurocognitive disorder . GAD:  Stable, continue home medications.   Class I Obesity w/ Body mass index is 31.19 kg/m. Will benefit with PCP follow-up, weight loss,healthy lifestyle and outpatient eval   Type 2 diabetes: Poorly controlled hyperglycemia.PTA on glipizide metformin  Actos , now on Insulin .  Lab Results  Component Value Date   HGBA1C 5.6 03/24/2024   CBG (last 3)  Recent Labs    04/22/24 1219 04/22/24 1620 04/23/24 0710  GLUCAP 171* 135* 160*    Discharge diagnosis     Principal Problem:   Periprosthetic hip fracture, initial  encounter Active Problems:   Essential hypertension   Acute on chronic anemia   Thrombocytopenia   IDA (iron  deficiency anemia)   Generalized anxiety disorder   DM type 2 with diabetic background retinopathy (HCC)   Chronic kidney disease, stage 3a (HCC)   Dyslipidemia   History of stroke    Discharge instructions    Discharge Instructions     Discharge instructions   Complete by: As directed    Follow with Primary MD [name], MD in 7 days   Get CBC, CMP, Magnesium, 2 view Chest X ray -  checked next visit with your primary MD or SNF MD   Activity: Nonweightbearing left lower leg, left posterior hip precautions, use full fall precautions use walker/cane & assistance as needed  Disposition SNF  Diet: Heart Healthy Carb, check CBGs QAC-HS  Special Instructions: If you have smoked or chewed Tobacco  in the last 2 yrs please stop smoking, stop any regular Alcohol   and or any Recreational drug use.  On your next visit with your primary care physician please Get Medicines reviewed and adjusted.  Please request your Prim.MD to go over all Hospital Tests and Procedure/Radiological results at the follow up, please get all Hospital records sent to your Prim MD by signing hospital release before you go home.  If you experience worsening of your admission symptoms, develop shortness of breath, life threatening emergency, suicidal or homicidal thoughts you must seek medical attention immediately by calling 911 or calling your MD immediately  if symptoms less severe.  You Must read complete instructions/literature along with all the possible adverse reactions/side effects for all the Medicines you take and that have been prescribed to you. Take any new Medicines after you have completely understood and accpet all the possible adverse reactions/side effects.   Do not drive when taking Pain medications.  Do not take more than prescribed Pain, Sleep and Anxiety Medications  Wear  Seat belts while driving.   Discharge wound  care:   Complete by: As directed    Left hip postop site clean and dry, dry dressing.   Increase activity slowly   Complete by: As directed        Discharge Medications   Allergies as of 04/23/2024       Reactions   Hydralazine     Short of breath.     Promethazine Hcl    AGITIATION        Medication List     STOP taking these medications    glimepiride  2 MG tablet Commonly known as: AMARYL    metFORMIN  850 MG tablet Commonly known as: GLUCOPHAGE    pioglitazone  45 MG tablet Commonly known as: ACTOS        TAKE these medications    acetaminophen  325 MG tablet Commonly known as: TYLENOL  Take 2 tablets (650 mg total) by mouth every 4 (four) hours as needed for mild pain (or temp > 37.5 C (99.5 F)).   amLODipine  10 MG tablet Commonly known as: NORVASC  Take 1 tablet (10 mg total) by mouth daily.   ascorbic acid  1000 MG tablet Commonly known as: VITAMIN C  Take 1 tablet (1,000 mg total) by mouth daily.   atorvastatin  10 MG tablet Commonly known as: LIPITOR Take 1 tablet (10 mg total) by mouth at bedtime.   carvedilol  3.125 MG tablet Commonly known as: COREG  Take 1 tablet (3.125 mg total) by mouth 2 (two) times daily with a meal.   clopidogrel  75 MG tablet Commonly known as: PLAVIX  TAKE 1 TABLET EVERY DAY   docusate sodium  100 MG capsule Commonly known as: COLACE Take 2 capsules (200 mg total) by mouth daily as needed for mild constipation.   enoxaparin  40 MG/0.4ML injection Commonly known as: LOVENOX  Inject 0.4 mLs (40 mg total) into the skin daily.   Ensure Max Protein Liqd Take 330 mLs (11 oz total) by mouth 2 (two) times daily between meals.   nutrition supplement (JUVEN) Pack Take 1 packet by mouth 2 (two) times daily between meals.   escitalopram  10 MG tablet Commonly known as: Lexapro  Take 1 tablet (10 mg total) by mouth daily.   ferrous sulfate  325 (65 FE) MG tablet Take 1 tablet (325 mg total)  by mouth daily with breakfast.   folic acid  1 MG tablet Commonly known as: FOLVITE  Take 1 tablet (1 mg total) by mouth daily.   hydrochlorothiazide  25 MG tablet Commonly known as: HYDRODIURIL  Take 0.5 tablets (12.5 mg total) by mouth daily.   HYDROcodone -acetaminophen  5-325 MG tablet Commonly known as: NORCO/VICODIN Take 1 tablet by mouth every 6 (six) hours as needed for moderate pain (pain score 4-6).   insulin  aspart 100 UNIT/ML FlexPen Commonly known as: NOVOLOG  Before each meal 3 times a day, 140-199 - 2 units, 200-250 - 4 units, 251-299 - 6 units,  300-349 - 8 units,  350 or above 10 units.   insulin  glargine 100 UNIT/ML injection Commonly known as: LANTUS  Inject 0.1 mLs (10 Units total) into the skin daily.   IRON -VITAMIN C  PO Take 1 tablet by mouth in the morning.   isosorbide  mononitrate 60 MG 24 hr tablet Commonly known as: IMDUR  Take 1 tablet (60 mg total) by mouth daily.   losartan  100 MG tablet Commonly known as: COZAAR  TAKE 1 TABLET EVERY DAY   memantine  5 MG tablet Commonly known as: NAMENDA  Take 1 tablet (5 mg total) by mouth 2 (two) times daily.   multivitamin with minerals Tabs tablet Take 1 tablet by mouth daily. What changed:  when to take this additional instructions   niacin  500 MG ER tablet Commonly known as: VITAMIN B3 Take 1 tablet (500 mg total) by mouth 2 (two) times daily.   polyethylene glycol 17 g packet Commonly known as: MIRALAX  / GLYCOLAX  Take 17 g by mouth daily.   tamsulosin  0.4 MG Caps capsule Commonly known as: FLOMAX  Take 0.4 mg by mouth at bedtime.   Vitamin D3 125 MCG (5000 UT) Caps Take 1 capsule (5,000 Units total) by mouth daily.               Discharge Care Instructions  (From admission, onward)           Start     Ordered   04/23/24 0000  Discharge wound care:       Comments: Left hip postop site clean and dry, dry dressing.   04/23/24 0843             Contact information for follow-up  providers     Celena Sharper, MD. Schedule an appointment as soon as possible for a visit in 3 day(s).   Specialty: Orthopedic Surgery Contact information: [address] [place] [address] [phone]         [name], MD. Schedule an appointment as soon as possible for a visit in 1 week(s).   Specialty: Family Medicine Contact information: [address] [place] [address] [phone]              Contact information for after-discharge care     Destination     [name] .   Service: Skilled Nursing Contact information: [address] [place]   [address] [phone]                     Major procedures and Radiology Reports - PLEASE review detailed and final reports thoroughly  -       CT ABDOMEN PELVIS WO CONTRAST Result Date: 04/17/2024 CLINICAL DATA:  Abdominal pain. Decreased hematocrit. Recent left hip arthroplasty. EXAM: CT ABDOMEN AND PELVIS WITHOUT CONTRAST TECHNIQUE: Multidetector CT imaging of the abdomen and pelvis was performed following the standard protocol without IV contrast. RADIATION DOSE REDUCTION: This exam was performed according to the departmental dose-optimization program which includes automated exposure control, adjustment of the mA and/or kV according to patient size and/or use of iterative reconstruction technique. COMPARISON:  03/19/2022 FINDINGS: Lower chest: New tiny bilateral pleural effusions. Hepatobiliary: No mass visualized on this unenhanced exam. Small gallstones are seen, but without signs of cholecystitis or biliary dilatation. Pancreas: No mass or inflammatory process visualized on this unenhanced exam. Spleen:  Within normal limits in size. Adrenals/Urinary tract: No evidence of urolithiasis or hydronephrosis. Unremarkable unopacified urinary bladder. Stomach/Bowel: No evidence of obstruction, inflammatory process, or abnormal fluid collections. Vascular/Lymphatic: No  pathologically enlarged lymph nodes identified. No evidence of abdominal aortic aneurysm. Reproductive:  No mass or other significant abnormality. Other: No No evidence of retroperitoneal hemorrhage. Increased diffuse body wall edema. Musculoskeletal: No suspicious bone lesions identified. New left hip prosthesis in expected position. No evidence of left pelvic or hip hematoma. IMPRESSION: No evidence of acute abdominal or pelvic hemorrhage/hematoma. Increased diffuse body wall edema and new tiny bilateral pleural effusions, consistent with 3rd spacing. Cholelithiasis. No radiographic evidence of cholecystitis. Electronically Signed   By: Norleen DELENA Kil M.D.   On: 04/17/2024 12:17   CT HIP LEFT WO CONTRAST Result Date: 04/17/2024 CLINICAL DATA:  Status post fixation of a periprosthetic left femur  fracture. Recurrent anemia. Concern for hematoma. EXAM: CT OF THE LEFT HIP WITHOUT CONTRAST TECHNIQUE: Multidetector CT imaging of the left hip was performed according to the standard protocol. Multiplanar CT image reconstructions were also generated. RADIATION DOSE REDUCTION: This exam was performed according to the departmental dose-optimization program which includes automated exposure control, adjustment of the mA and/or kV according to patient size and/or use of iterative reconstruction technique. COMPARISON:  CT and radiographs of the left femur dated 04/12/2024 FINDINGS: Bones/Joint/Cartilage Status post lateral cortical plate and screw fixation of an oblique periprosthetic fracture of the proximal to mid femoral shaft surrounding the distal third of the femoral stem with improved alignment. The distal aspect of the fixation construct and femur is not included within the field of view. Left total hip arthroplasty demonstrates normal alignment with cerclage wire fixation. Ligaments Ligaments are suboptimally evaluated by CT. Soft tissue and Muscles Streak artifact from indwelling orthopedic hardware within the left  hip and femur limits detailed evaluation of the surrounding soft tissues. Fullness and edema of the visualized anterior compartment musculature of the thigh, predominantly involving the quadriceps musculature, appears similar to slightly increased compared to the prior exam. No discrete intramuscular collection, although, underlying intramuscular hematoma can not be entirely excluded. Postsurgical changes along the left lateral hip with surrounding subcutaneous soft tissue edema and ill-defined fluid. No loculated fluid collection. Calcification of the left common femoral artery and its major branches. IMPRESSION: 1. Streak artifact from indwelling orthopedic hardware within the left hip and femur limits detailed evaluation of the surrounding soft tissues. Fullness and edema of the visualized anterior compartment musculature of the thigh, predominantly involving the quadriceps musculature, appears similar to slightly increased compared to the prior exam. No discrete intramuscular collection, although, underlying intramuscular hematoma can not be entirely excluded. 2. Postsurgical changes along the left lateral hip with surrounding subcutaneous soft tissue edema and ill-defined fluid. No loculated fluid collection. 3. Status post lateral cortical plate and screw fixation of a left femoral shaft periprosthetic fracture with improved alignment. Left hip arthroplasty demonstrates normal alignment. Electronically Signed   By: Harrietta Sherry M.D.   On: 04/17/2024 11:47   DG FEMUR PORT MIN 2 VIEWS LEFT Result Date: 04/12/2024 CLINICAL DATA:  Fracture, postop. EXAM: LEFT FEMUR PORTABLE 2 VIEWS COMPARISON:  Preoperative imaging FINDINGS: Lateral plate and screw fixation of periprosthetic femur fracture. Decreased fracture displacement from preoperative imaging. Previous hip arthroplasty with cerclage wire fixation. Previous hardware within the patella and proximal tibia. Recent postsurgical change includes air and  edema in the soft tissues. IMPRESSION: ORIF of periprosthetic femur fracture. Decreased fracture displacement from preoperative imaging. Electronically Signed   By: Andrea Gasman M.D.   On: 04/12/2024 20:36   DG FEMUR MIN 2 VIEWS LEFT Result Date: 04/12/2024 CLINICAL DATA:  Elective surgery. EXAM: LEFT FEMUR 2 VIEWS COMPARISON:  Preoperative imaging FINDINGS: Twelve fluoroscopic spot views of the left femur submitted from the operating room. Plate and screw fixation of periprosthetic fracture adjacent to femoral stem of hip arthroplasty. Previous surgical fixation of the patella and proximal tibia. Fluoroscopy time 51.5 seconds. Dose 9.84 mGy. IMPRESSION: Intraoperative fluoroscopy during ORIF of femur fracture. Electronically Signed   By: Andrea Gasman M.D.   On: 04/12/2024 20:35   DG C-Arm 1-60 Min-No Report Result Date: 04/12/2024 Fluoroscopy was utilized by the requesting physician.  No radiographic interpretation.   DG C-Arm 1-60 Min-No Report Result Date: 04/12/2024 Fluoroscopy was utilized by the requesting physician.  No radiographic interpretation.   DG C-Arm  1-60 Min-No Report Result Date: 04/12/2024 Fluoroscopy was utilized by the requesting physician.  No radiographic interpretation.   DG C-Arm 1-60 Min-No Report Result Date: 04/12/2024 Fluoroscopy was utilized by the requesting physician.  No radiographic interpretation.   CT FEMUR LEFT WO CONTRAST Result Date: 04/12/2024 EXAM: CT OF THE LEFT FEMUR, WITHOUT IV CONTRAST 04/12/2024 06:01:52 AM TECHNIQUE: Axial images were acquired through the left femur without IV contrast. Reformatted images were reviewed. Automated exposure control, iterative reconstruction, and/or weight based adjustment of the mA/kV was utilized to reduce the radiation dose to as low as reasonably achievable. COMPARISON: Left hip and left knee films from yesterday. CLINICAL HISTORY: Fracture, femur Fracture, femur FINDINGS: BONES: There is spray artifact  from a left hip replacement with cerclage wiring, and lateral side plate ORIF hardware in the proximal tibia as well as cerclage wiring with healed fracture of the patella. There is an acute spiral oblique perihardware proximal to mid femoral shaft fracture at the level of the distal third of the femoral stem. There is a sizable butterfly combination fragment posteriorly at the level of the fracture which is displaced posteriorly by about 2 widths of the cortex. The main distal fracture fragment is displaced anteriorly and laterally by one third of the shaft's width and is also rotated laterally, almost at a right angle to the proximal fragment. Bridging osteophytes anterior left SI joint. The visualized portions of the left hemipelvis are unremarkable. There is no further evidence of fractures. JOINTS: No dislocation. There is a low density suprapatellar bursal effusion at the knee. There is mild arthrosis of the knee. No hip joint effusion. SOFT TISSUES: Although not well seen due to metal artifact, there is suspected to be some swelling in the anterior thigh compartment muscles. Underlying hematoma is not excluded. There are moderate calcified plaques in the left common femoral and superficial femoral arteries and in the popliteal and proximal popliteal trifurcation arteries. IMPRESSION: 1. Acute spiral oblique perihardware proximal to mid femoral shaft fracture at the level of the distal third of the femoral stem, with a sizable posterior butterfly fragment displaced posteriorly by about 2 cortex widths, and with the main distal fragment displaced anteriorly and laterally by one third shaft width and rotated laterally nearly 90 degrees relative to the proximal fragment. 2. Suspected anterior thigh compartment muscle swelling, with underlying hematoma not excluded. 3. Suprapatellar bursal effusion at the knee, without hip joint effusion. Electronically signed by: Francis Quam MD 04/12/2024 06:24 AM EST RP  Workstation: [id number]   DG Knee Complete 4 Views Left Result Date: 04/11/2024 CLINICAL DATA:  Status post fall with left hip pain. EXAM: LEFT KNEE - COMPLETE 4+ VIEW COMPARISON:  None Available. FINDINGS: Periprosthetic fracture about the femoral stem of hip arthroplasty, partially included in the field of view. Evaluation of the knee is limited due to difficulties with positioning. No obvious additional fracture. Two K-wires traverse the patella, 1 of which is broken. Plate and screw fixation of the proximal lateral tibia. No joint effusion. Mild generalized soft tissue edema. IMPRESSION: 1. Periprosthetic fracture about the femoral stem of hip arthroplasty, partially included. 2. Surgical fixation of the patella, 1 of the K-wires is broken. 3. Allowing for difficulty in limitations with positioning, no additional acute fracture of the knee. Electronically Signed   By: Andrea Gasman M.D.   On: 04/11/2024 21:38   DG Hip Unilat W or Wo Pelvis 2-3 Views Left Result Date: 04/11/2024 CLINICAL DATA:  Status post fall, left hip pain. EXAM: DG  HIP (WITH OR WITHOUT PELVIS) 2-3V LEFT COMPARISON:  None Available. FINDINGS: Left hip arthroplasty in place. Mildly displaced oblique/spiral fracture about the femoral stem. The femoral component is seated in the acetabular component. No pelvic fracture. Pubic rami are intact. IMPRESSION: Mildly displaced oblique/spiral periprosthetic fracture about the femoral stem. Electronically Signed   By: Andrea Gasman M.D.   On: 04/11/2024 21:36    Micro Results    No results found for this or any previous visit (from the past 240 hours).  Today   Subjective    Dennis Wise today has no headache,no chest abdominal pain,no new weakness tingling or numbness, feels much better wants to go home today.    Objective   Blood pressure (!) 167/65, pulse (!) 52, temperature (!) 97.5 F (36.4 C), temperature source Axillary, resp. rate (!) 21, height 6' 2 (1.88 m),  weight 110.2 kg, SpO2 97%.   Intake/Output Summary (Last 24 hours) at 04/23/2024 0843 Last data filed at 04/22/2024 1700 Gross per 24 hour  Intake 1180 ml  Output 950 ml  Net 230 ml    Exam  Awake Alert, No new F.N deficits,    [place].AT,PERRAL Supple Neck,   Symmetrical Chest wall movement, Good air movement bilaterally, CTAB RRR,No Gallops,   +ve B.Sounds, Abd Soft, Non tender,  No Cyanosis, Clubbing or edema    Data Review   Recent Labs  Lab 04/17/24 0534 04/18/24 0436 04/19/24 0348 04/20/24 0331 04/20/24 1020 04/21/24 0328 04/21/24 2139 04/22/24 0426 04/23/24 0618  WBC 5.5 5.1 4.8 4.5 4.5 4.8  --  7.1 8.5  HGB 7.9* 9.2* 8.5* 7.7* 7.8* 7.6* 8.8* 9.4* 9.2*  HCT 22.4* 26.4* 25.2* 22.9* 22.6* 22.4* 26.2* 28.5* 27.7*  PLT 80* 96* 106* 118* 118* 130*  --  153 157  MCV 88.2 88.6 87.8 87.1 86.9 86.8  --  89.3 89.6  MCH 31.1 30.9 29.6 29.3 30.0 29.5  --  29.5 29.8  MCHC 35.3 34.8 33.7 33.6 34.5 33.9  --  33.0 33.2  RDW 15.8* 15.2 14.7 14.8 14.9 14.8  --  14.7 15.1  LYMPHSABS 0.7 0.9 0.7 0.8  --   --   --  1.0  --   MONOABS 0.7 0.7 0.6 0.5  --   --   --  0.6  --   EOSABS 0.3 0.4 0.2 0.2  --   --   --  0.2  --   BASOSABS 0.0 0.0 0.0 0.0  --   --   --  0.0  --     Recent Labs  Lab 04/17/24 0534 04/17/24 1301 04/18/24 0436 04/19/24 0348 04/20/24 0331 04/21/24 0328 04/22/24 0426  NA 134*  --  136 135 137 138 138  K 4.1  --  4.1 4.1 4.2 4.2 3.9  CL 101  --  102 100 102 102 101  CO2 24  --  25 25 26 26 27   ANIONGAP 9  --  9 10 9 10 10   GLUCOSE 210*  --  127* 275* 151* 201* 109*  BUN 64*  --  62* 63* 64* 60* 52*  CREATININE 1.53*  --  1.35* 1.35* 1.40* 1.41* 1.36*  AST  --   --   --   --   --  21  --   ALT  --   --   --   --   --  16  --   ALKPHOS  --   --   --   --   --  132*  --   BILITOT  --   --   --   --   --  0.7  --   ALBUMIN   --   --   --   --   --  2.9*  --   INR  --  1.1  --   --   --   --   --   MG 1.8  --  1.8 1.7  --   --   --   PHOS 2.6  --  2.5 2.5   --   --   --   CALCIUM  8.1*  --  8.9 8.6* 8.6* 8.5* 8.7*    Total Time in preparing paper work, data evaluation and todays exam - 35 minutes  Signature  -    Lavada Stank M.D on 04/23/2024 at 8:43 AM   -  To page go to [email or web address]      "

## 2024-04-23 NOTE — TOC Transition Note (Signed)
 Transition of Care [facility]) - Discharge Note   Patient Details  Name: Dennis Wise MRN: [id number] Date of Birth: 10/23/1950  Transition of Care [facility]) CM/SW Contact:  [name], LCSW Phone Number: 04/23/2024, 11:01 AM   Clinical Narrative:   Pt will discharge to [name], room 107. RN report to [phone].   PTAR called 1100.    Final next level of care: Skilled Nursing Facility Barriers to Discharge: Barriers Resolved   Patient Goals and CMS Choice Patient states their goals for this hospitalization and ongoing recovery are:: Rehab CMS Medicare.gov Compare Post Acute Care list provided to:: Patient Represenative (must comment) Choice offered to / list presented to : Spouse  ownership interest in [facility].provided to:: Spouse    Discharge Placement              Patient chooses bed at: [name] Patient to be transferred to facility by: ptar Name of family member notified: wife [name] Patient and family notified of of transfer: 04/23/24  Discharge Plan and Services Additional resources added to the After Visit Summary for   In-house Referral: Clinical Social Work   Post Acute Care Choice: Skilled Nursing Facility                               Social Drivers of Health (SDOH) Interventions SDOH Screenings   Food Insecurity: No Food Insecurity (04/13/2024)  Housing: Unknown (04/13/2024)  Transportation Needs: No Transportation Needs (04/13/2024)  Utilities: Patient Declined (04/13/2024)  Alcohol  Screen: Low Risk (05/05/2023)  Depression (PHQ2-9): Low Risk (03/24/2024)  Financial Resource Strain: Low Risk (02/03/2024)  Physical Activity: Inactive (02/03/2024)  Social Connections: Moderately Isolated (04/13/2024)  Stress: No Stress Concern Present (02/03/2024)  Tobacco Use: Low Risk (04/12/2024)  Health Literacy: Adequate Health Literacy (05/05/2023)     Readmission Risk Interventions     No data to display

## 2024-04-27 ENCOUNTER — Encounter (HOSPITAL_COMMUNITY): Payer: Self-pay | Admitting: Orthopedic Surgery

## 2024-05-06 ENCOUNTER — Ambulatory Visit: Admitting: Family Medicine

## 2024-05-06 ENCOUNTER — Ambulatory Visit: Payer: Medicare HMO

## 2024-05-20 ENCOUNTER — Telehealth: Payer: Self-pay

## 2024-05-20 NOTE — Transitions of Care (Post Inpatient/ED Visit) (Signed)
 "  05/20/2024  Name: Dennis Wise MRN: [id number] DOB: 1950/10/15  Today's TOC FU Call Status: Today's TOC FU Call Status:: Successful TOC FU Call Completed TOC FU Call Complete Date: 05/20/24  Patient's Name and Date of Birth confirmed. Name, DOB  Transition Care Management Follow-up Telephone Call Date of Discharge: 05/19/24 Discharge Facility: Other [facility]) Name of Other (Non[facility]) Discharge Facility: [facility] Type of Discharge: Inpatient Admission Primary Inpatient Discharge Diagnosis:: fracture left humerus How have you been since you were released from the hospital?: Better Any questions or concerns?: No  Items Reviewed: Did you receive and understand the discharge instructions provided?: Yes Medications obtained,verified, and reconciled?: Yes (Medications Reviewed) Any new allergies since your discharge?: No Dietary orders reviewed?: Yes Do you have support at home?: Yes People in Home [RPT]: spouse  Medications Reviewed Today: Medications Reviewed Today     Reviewed by Emmitt Pan, LPN (Licensed Practical Nurse) on 05/20/24 at 1029  Med List Status: <None>   Medication Order Taking? Sig Documenting Provider Last Dose Status Informant  acetaminophen  (TYLENOL ) 325 MG tablet [id number] Yes Take 2 tablets (650 mg total) by mouth every 4 (four) hours as needed for mild pain (or temp > 37.5 C (99.5 F)). Sheikh, Omair [place], DO  Active Spouse/Significant Other, Pharmacy Records  amLODipine  (NORVASC ) 10 MG tablet [id number]  Take 1 tablet (10 mg total) by mouth daily.  Patient not taking: Reported on 05/20/2024   Singh, Prashant K, MD  Active   ascorbic acid  (VITAMIN C ) 1000 MG tablet [id number] Yes Take 1 tablet (1,000 mg total) by mouth daily. Deward Eck, PA-C  Active   atorvastatin  (LIPITOR) 10 MG tablet [id number] Yes Take 1 tablet (10 mg total) by mouth at bedtime. [name], MD  Active Spouse/Significant Other, Pharmacy Records  carvedilol  (COREG )  3.125 MG tablet [id number]  Take 1 tablet (3.125 mg total) by mouth 2 (two) times daily with a meal.  Patient not taking: Reported on 05/20/2024   Dennise Lavada POUR, MD  Active   Cholecalciferol  (VITAMIN D3) 125 MCG (5000 UT) CAPS [id number] Yes Take 1 capsule (5,000 Units total) by mouth daily. Deward Eck, PA-C  Active   clopidogrel  (PLAVIX ) 75 MG tablet [id number] Yes TAKE 1 TABLET EVERY DAY [name], MD  Active Spouse/Significant Other, Pharmacy Records  docusate sodium  (COLACE) 100 MG capsule [id number] Yes Take 2 capsules (200 mg total) by mouth daily as needed for mild constipation. Singh, Prashant K, MD  Active   enoxaparin  (LOVENOX ) 40 MG/0.4ML injection [id number] Yes Inject 0.4 mLs (40 mg total) into the skin daily. Singh, Prashant K, MD  Active   Ensure Max Protein (ENSURE MAX PROTEIN) BERNICE [id number] Yes Take 330 mLs (11 oz total) by mouth 2 (two) times daily between meals. Deward Eck, PA-C  Active   escitalopram  (LEXAPRO ) 10 MG tablet [id number] Yes Take 1 tablet (10 mg total) by mouth daily. Saucier, Dorn Ruth, NP  Active Spouse/Significant Other, Pharmacy Records  ferrous sulfate  325 [phone] FE) MG tablet [id number] Yes Take 1 tablet (325 mg total) by mouth daily with breakfast. Singh, Prashant K, MD  Active   folic acid  (FOLVITE ) 1 MG tablet [id number] Yes Take 1 tablet (1 mg total) by mouth daily. Singh, Prashant K, MD  Active   hydrochlorothiazide  (HYDRODIURIL ) 25 MG tablet [id number] Yes Take 0.5 tablets (12.5 mg total) by mouth daily. [name], MD  Active Spouse/Significant Other, Pharmacy Records  HYDROcodone -acetaminophen  (NORCO/VICODIN) 5-325 MG tablet [id number] Yes Take 1 tablet by  mouth every 6 (six) hours as needed for moderate pain (pain score 4-6). Deward Eck, PA-C  Active   insulin  aspart (NOVOLOG ) 100 UNIT/ML FlexPen [id number] Yes Before each meal 3 times a day, 140-199 - 2 units, 200-250 - 4 units, 251-299 - 6 units,  300-349 - 8 units,  350 or above 10 units.  Singh, Prashant K, MD  Active   insulin  glargine (LANTUS ) 100 UNIT/ML injection [id number] Yes Inject 0.1 mLs (10 Units total) into the skin daily. Singh, Prashant K, MD  Active   IRON -VITAMIN C  PO [id number] Yes Take 1 tablet by mouth in the morning. [provider]  Active Spouse/Significant Other, Pharmacy Records  isosorbide  mononitrate (IMDUR ) 60 MG 24 hr tablet [id number]  Take 1 tablet (60 mg total) by mouth daily.  Patient not taking: Reported on 05/20/2024   Singh, Prashant K, MD  Active   losartan  (COZAAR ) 100 MG tablet [id number] Yes TAKE 1 TABLET EVERY DAY [name], MD  Active Spouse/Significant Other, Pharmacy Records  memantine  (NAMENDA ) 5 MG tablet [id number] Yes Take 1 tablet (5 mg total) by mouth 2 (two) times daily. Wertman, Sara E, PA-C  Active Spouse/Significant Other, Pharmacy Records  Multiple Vitamin (MULTIVITAMIN WITH MINERALS) TABS tablet [id number] Yes Take 1 tablet by mouth daily. Deward Eck, PA-C  Active   niacin  (VITAMIN B3) 500 MG ER tablet [id number] Yes Take 1 tablet (500 mg total) by mouth 2 (two) times daily. [name], MD  Active Spouse/Significant Other, Pharmacy Records  nutrition supplement, JUVEN, (JUVEN) [place] [id number] Yes Take 1 packet by mouth 2 (two) times daily between meals. Singh, Prashant K, MD  Active   polyethylene glycol (MIRALAX  / GLYCOLAX ) 17 g packet [id number] Yes Take 17 g by mouth daily. Singh, Prashant K, MD  Active   tamsulosin  (FLOMAX ) 0.4 MG CAPS capsule [id number] Yes Take 0.4 mg by mouth at bedtime. [provider]  Active             Home Care and Equipment/Supplies: Were Home Health Services Ordered?: Yes Name of Home Health Agency:: [facility] Has Agency set up a time to come to your home?: Yes First Home Health Visit Date: 05/21/24 Any new equipment or medical supplies ordered?: No  Functional Questionnaire: Do you need assistance with bathing/showering or dressing?: Yes Do you need assistance with  meal preparation?: Yes Do you need assistance with eating?: No Do you have difficulty maintaining continence: No Do you need assistance with getting out of bed/getting out of a chair/moving?: Yes Do you have difficulty managing or taking your medications?: Yes  Follow up appointments reviewed: PCP Follow-up appointment confirmed?: Yes Date of PCP follow-up appointment?: 05/26/24 Follow-up Provider: [facility] Follow-up appointment confirmed?: Yes Date of Specialist follow-up appointment?: 06/08/24 Follow-Up Specialty Provider:: surgeon Do you need transportation to your follow-up appointment?: No Do you understand care options if your condition(s) worsen?: Yes-patient verbalized understanding    SIGNATURE Julian Lemmings, LPN [facility] Nurse Health Advisor Direct Dial [phone]  "

## 2024-05-23 ENCOUNTER — Ambulatory Visit: Admitting: Family Medicine

## 2024-05-23 NOTE — Telephone Encounter (Signed)
 Noted. Thanks.

## 2024-05-25 LAB — PLATELET FUNCTION ASSAY
Collagen / ADP: 183 s — ABNORMAL HIGH (ref 0–118)
Collagen / Epinephrine: 271 s — ABNORMAL HIGH (ref 0–193)

## 2024-05-26 ENCOUNTER — Ambulatory Visit: Admitting: Family Medicine

## 2024-05-26 ENCOUNTER — Ambulatory Visit: Payer: Self-pay | Admitting: Family Medicine

## 2024-05-26 DIAGNOSIS — D649 Anemia, unspecified: Secondary | ICD-10-CM

## 2024-05-26 DIAGNOSIS — E113299 Type 2 diabetes mellitus with mild nonproliferative diabetic retinopathy without macular edema, unspecified eye: Secondary | ICD-10-CM

## 2024-05-26 NOTE — Patient Instructions (Addendum)
 Labs today.  We'll update you about that.  If your BP stays below 110/70, then skip the hydrochlorothiazide  that day.  I would skip hydrochlorothiazide  tomorrow.  If you keep having diarrhea, you could try imodium  midday.   Take care.  Glad to see you.

## 2024-05-26 NOTE — Progress Notes (Unsigned)
 Labs pending.  Lower BP noted today.  He had higher BP at home yesterday.    Off insulin .  On metformin , glimiperide, and actos .  Sugar 100-200.   He isn't lightheaded.      Still taking losartan  and hydrochlorothiazide .  Off imdur , coreg , amlodipine .   Recheck CBC pending.    He had baseline aches from the L hip and also shoulder pain.  Tylenol  helps the pain.   Lower appetite noted.  Diarrhea after eating supper.  Still on iron .  Tired imodium  but that was not prior to meals.    GI sx were some better last night.   He thought he had pulled a muscle in his abd wall, in the midst of PT.    Meds, vitals, and allergies reviewed.   ROS: Per HPI unless specifically indicated in ROS section   Nonweight bearing with f/u pending.   BP 90/58.

## 2024-05-27 LAB — COMPREHENSIVE METABOLIC PANEL WITH GFR
ALT: 12 U/L (ref 3–53)
AST: 15 U/L (ref 5–37)
Albumin: 3.4 g/dL — ABNORMAL LOW (ref 3.5–5.2)
Alkaline Phosphatase: 164 U/L — ABNORMAL HIGH (ref 39–117)
BUN: 41 mg/dL — ABNORMAL HIGH (ref 6–23)
CO2: 25 meq/L (ref 19–32)
Calcium: 8.9 mg/dL (ref 8.4–10.5)
Chloride: 102 meq/L (ref 96–112)
Creatinine, Ser: 1.58 mg/dL — ABNORMAL HIGH (ref 0.40–1.50)
GFR: 42.41 mL/min — ABNORMAL LOW
Glucose, Bld: 127 mg/dL — ABNORMAL HIGH (ref 70–99)
Potassium: 4.4 meq/L (ref 3.5–5.1)
Sodium: 137 meq/L (ref 135–145)
Total Bilirubin: 0.8 mg/dL (ref 0.2–1.2)
Total Protein: 6.9 g/dL (ref 6.0–8.3)

## 2024-05-27 LAB — CBC WITH DIFFERENTIAL/PLATELET
Basophils Absolute: 0.1 10*3/uL (ref 0.0–0.1)
Basophils Relative: 1 % (ref 0.0–3.0)
Eosinophils Absolute: 0.3 10*3/uL (ref 0.0–0.7)
Eosinophils Relative: 4.4 % (ref 0.0–5.0)
HCT: 26.5 % — ABNORMAL LOW (ref 39.0–52.0)
Hemoglobin: 9.1 g/dL — ABNORMAL LOW (ref 13.0–17.0)
Lymphocytes Relative: 14.9 % (ref 12.0–46.0)
Lymphs Abs: 0.9 10*3/uL (ref 0.7–4.0)
MCHC: 34.3 g/dL (ref 30.0–36.0)
MCV: 83.4 fl (ref 78.0–100.0)
Monocytes Absolute: 0.4 10*3/uL (ref 0.1–1.0)
Monocytes Relative: 6.4 % (ref 3.0–12.0)
Neutro Abs: 4.3 10*3/uL (ref 1.4–7.7)
Neutrophils Relative %: 73.3 % (ref 43.0–77.0)
Platelets: 163 10*3/uL (ref 150.0–400.0)
RBC: 3.18 Mil/uL — ABNORMAL LOW (ref 4.22–5.81)
RDW: 16 % — ABNORMAL HIGH (ref 11.5–15.5)
WBC: 5.9 10*3/uL (ref 4.0–10.5)

## 2024-05-27 LAB — FERRITIN: Ferritin: 654.1 ng/mL — ABNORMAL HIGH (ref 22.0–322.0)

## 2024-05-27 LAB — VITAMIN B12: Vitamin B-12: 1343 pg/mL — ABNORMAL HIGH (ref 211–911)

## 2024-06-06 ENCOUNTER — Ambulatory Visit: Admitting: Physician Assistant

## 2024-06-28 ENCOUNTER — Inpatient Hospital Stay: Admitting: Family Medicine

## 2024-09-22 ENCOUNTER — Ambulatory Visit
